# Patient Record
Sex: Female | Born: 1965
Health system: Southern US, Community
[De-identification: ages and names within clinical notes are randomized; demographics above are authoritative.]

## PROBLEM LIST (undated history)

## (undated) DIAGNOSIS — E119 Type 2 diabetes mellitus without complications: Secondary | ICD-10-CM

## (undated) DIAGNOSIS — Z9289 Personal history of other medical treatment: Secondary | ICD-10-CM

## (undated) DIAGNOSIS — Z9981 Dependence on supplemental oxygen: Secondary | ICD-10-CM

## (undated) DIAGNOSIS — I2510 Atherosclerotic heart disease of native coronary artery without angina pectoris: Secondary | ICD-10-CM

## (undated) DIAGNOSIS — R519 Headache, unspecified: Secondary | ICD-10-CM

## (undated) DIAGNOSIS — F329 Major depressive disorder, single episode, unspecified: Secondary | ICD-10-CM

## (undated) DIAGNOSIS — J189 Pneumonia, unspecified organism: Secondary | ICD-10-CM

## (undated) DIAGNOSIS — I10 Essential (primary) hypertension: Secondary | ICD-10-CM

## (undated) DIAGNOSIS — J45909 Unspecified asthma, uncomplicated: Secondary | ICD-10-CM

## (undated) DIAGNOSIS — E7800 Pure hypercholesterolemia, unspecified: Secondary | ICD-10-CM

## (undated) DIAGNOSIS — F419 Anxiety disorder, unspecified: Secondary | ICD-10-CM

## (undated) DIAGNOSIS — N186 End stage renal disease: Secondary | ICD-10-CM

## (undated) DIAGNOSIS — E875 Hyperkalemia: Secondary | ICD-10-CM

## (undated) DIAGNOSIS — E109 Type 1 diabetes mellitus without complications: Secondary | ICD-10-CM

## (undated) DIAGNOSIS — I509 Heart failure, unspecified: Secondary | ICD-10-CM

## (undated) DIAGNOSIS — R51 Headache: Secondary | ICD-10-CM

## (undated) DIAGNOSIS — Z992 Dependence on renal dialysis: Secondary | ICD-10-CM

## (undated) DIAGNOSIS — F32A Depression, unspecified: Secondary | ICD-10-CM

## (undated) DIAGNOSIS — I2699 Other pulmonary embolism without acute cor pulmonale: Secondary | ICD-10-CM

## (undated) DIAGNOSIS — D649 Anemia, unspecified: Secondary | ICD-10-CM

## (undated) HISTORY — PX: ENDOMETRIAL ABLATION: SHX621

## (undated) HISTORY — PX: CORONARY ANGIOPLASTY WITH STENT PLACEMENT: SHX49

---

## 2009-08-15 ENCOUNTER — Emergency Department: Admit: 2009-08-15
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
## 2009-08-15 NOTE — ED Triage Note (Signed)
States took meds the am

## 2009-08-16 ENCOUNTER — Emergency Department: Admit: 2009-08-16 | Attending: EMERGENCY MEDICINE | Admitting: EMERGENCY MEDICINE

## 2009-08-16 ENCOUNTER — Encounter: Payer: Self-pay | Admitting: EMERGENCY MEDICINE

## 2009-08-16 HISTORY — DX: Type 2 diabetes mellitus without complications: E11.9

## 2009-08-16 MED ORDER — ONDANSETRON HCL (PF) 4 MG/2 ML INJECTION SOLUTION
4.0000 mg | Freq: Once | INTRAMUSCULAR | Status: AC
Start: 2009-08-17 — End: 2009-08-16
  Administered 2009-08-16: 4 mg via INTRAVENOUS

## 2009-08-16 MED ORDER — MORPHINE 4 MG/ML INJECTION SYRINGE
INJECTION | INTRAMUSCULAR | Status: AC
Start: 2009-08-16 — End: 2009-08-16
  Filled 2009-08-16: qty 1

## 2009-08-16 MED ORDER — ONDANSETRON HCL (PF) 4 MG/2 ML INJECTION SOLUTION
INTRAMUSCULAR | Status: AC
Start: 2009-08-16 — End: 2009-08-16
  Filled 2009-08-16: qty 2

## 2009-08-16 MED ORDER — OXYCODONE-ACETAMINOPHEN 5 MG-325 MG TABLET
1.0000 | ORAL_TABLET | Freq: Once | ORAL | Status: AC
Start: 2009-08-17 — End: 2009-08-17
  Administered 2009-08-17: 1 via ORAL

## 2009-08-16 MED ORDER — MORPHINE 2 MG/ML INJECTION SYRINGE
2.0000 mg | INJECTION | INTRAMUSCULAR | Status: DC | PRN
Start: 2009-08-16 — End: 2009-08-16
  Administered 2009-08-16 – 2009-08-17 (×2): 4 mg via INTRAVENOUS

## 2009-08-16 MED ORDER — ACETAMINOPHEN 325 MG TABLET
650.0000 mg | ORAL_TABLET | Freq: Once | ORAL | Status: AC
Start: 2009-08-16 — End: 2009-08-16
  Administered 2009-08-16: 650 mg via ORAL

## 2009-08-16 MED ORDER — ACETAMINOPHEN 325 MG TABLET
ORAL_TABLET | ORAL | Status: AC
Start: 2009-08-16 — End: 2009-08-16
  Filled 2009-08-16: qty 2

## 2009-08-16 MED ORDER — NACL 0.9% IV BOLUS - DURATION REQ
1000.0000 mL | Freq: Once | INTRAVENOUS | Status: AC
Start: 2009-08-16 — End: 2009-08-16
  Administered 2009-08-16: 1000 mL via INTRAVENOUS
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
## 2009-08-16 NOTE — ED Nursing Note (Signed)
Fluids and snacks provided for the patient.

## 2009-08-16 NOTE — ED Triage Note (Signed)
C/o fever, coughing blood- dark, c/o chest pain to left chest rad to left arm.  LS cta, FSBS 321.  Pt given asa, ntg x 3, CP now 610.  Pt speaking well, ambulates with steady gait.  Directly to Pod D bed.

## 2009-08-16 NOTE — ED Nursing Note (Signed)
Patient still states that her chest pain is 6/10 sharp and constant and radiating to the right arm. Will accept morphine at this time.

## 2009-08-17 DIAGNOSIS — R0781 Pleurodynia: Secondary | ICD-10-CM | POA: Insufficient documentation

## 2009-08-17 MED ORDER — ACETAMINOPHEN ER 650 MG TABLET,EXTENDED RELEASE
650.0000 mg | EXTENDED_RELEASE_TABLET | Freq: Three times a day (TID) | ORAL | Status: AC | PRN
Start: 2009-08-17 — End: 2009-08-24

## 2009-08-17 MED ORDER — OXYCODONE-ACETAMINOPHEN 5 MG-325 MG TABLET
ORAL_TABLET | ORAL | Status: AC
Start: 2009-08-17 — End: 2009-08-17
  Filled 2009-08-17: qty 1

## 2009-08-17 NOTE — ED Progress Note (Signed)
ED tansfer of care acceptance note.    I assumed the care of this pt from the prior attending, at change of shift. See their notes for the initial H&P and asessment/plan. Evaluation was still ongoing with some items pending. I have personally reviewed the pertinent labs and other studies and discussed the care with the off-going team.    Pending items included further evaluation by the PES service. Their concerns regarding whether the patient had a safe housing situation to return to.  As a result of this further evaluation PS has resolved. Her housing options. They're comfortable with discharging the patient at this time. The patient had already been medically cleared.    The patient is being discharged home in stable and improved condition. Ambulatory without assistance and tolerating oral intake without difficulty. The results of diagnostic studies were explained to the pt, or their representative, and questions answered. For the D/C instructions and medications, see the "after-visit-summary" records. The patient (or their legal representative) acknowledged receipt and understanding of those instructions and signed the D/C form. Will return to the ED for worsening or any sudden changes in symptoms.      Note is electronically signed by;  Malvin Johns. Dionisio David, MD, MPH  Professor and Attending Physician  Department of Emergency Medicine

## 2009-08-17 NOTE — ED Nursing Note (Signed)
Patient is alert and oriented x4. Perrla. States that her pain is 7/10 in the abdomen and chest achy and constant. Denies nausea, vomiting, or shortness of breath. States that she and her sister have made an arrangement and that she will go to [facility] to live with her. Patient is not afraid to go back to [facility] at this time. Comfort and safety measures are observed.
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
## 2009-08-17 NOTE — ED Nursing Note (Signed)
Assumed care of pt for lunch relief. Report received from Dot, RN.

## 2009-09-22 ENCOUNTER — Emergency Department: Admit: 2009-09-22

## 2009-09-22 NOTE — ED Triage Note (Signed)
Pt with r flank and side pain for 1 day.  Denies trauma.  States blood in urine.  Ambulated with steady gait.  NAD>  Awake and alert.  bs reading "hi" at triage.  States has been taking dm meds.

## 2010-06-02 DIAGNOSIS — I2699 Other pulmonary embolism without acute cor pulmonale: Secondary | ICD-10-CM

## 2010-06-02 HISTORY — DX: Other pulmonary embolism without acute cor pulmonale: I26.99

## 2011-11-04 ENCOUNTER — Ambulatory Visit

## 2011-12-09 ENCOUNTER — Ambulatory Visit: Admitting: Family Medicine

## 2015-10-20 ENCOUNTER — Encounter: Admission: AD | Disposition: A | Discharge status: Still In Hospital | Payer: Self-pay | Attending: Ophthalmology

## 2015-10-20 ENCOUNTER — Ambulatory Visit: Payer: MEDICAID | Admitting: Anesthesiology

## 2015-10-20 ENCOUNTER — Emergency Department (EMERGENCY_DEPARTMENT_HOSPITAL): Payer: MEDICAID

## 2015-10-20 ENCOUNTER — Inpatient Hospital Stay
Admission: EM | Admit: 2015-10-20 | Discharge: 2015-11-18 | DRG: 116 | Disposition: A | Discharge status: Home Health | Payer: MEDICAID | Attending: Hospitalist | Admitting: Hospitalist

## 2015-10-20 ENCOUNTER — Ambulatory Visit (HOSPITAL_BASED_OUTPATIENT_CLINIC_OR_DEPARTMENT_OTHER): Payer: MEDICAID

## 2015-10-20 ENCOUNTER — Ambulatory Visit (HOSPITAL_BASED_OUTPATIENT_CLINIC_OR_DEPARTMENT_OTHER)
Admission: AD | Admit: 2015-10-20 | Discharge: 2015-10-20 | Disposition: A | Discharge status: Still In Hospital | Payer: MEDICAID | Attending: Ophthalmology | Admitting: Ophthalmology

## 2015-10-20 ENCOUNTER — Encounter: Payer: Self-pay | Admitting: Emergency Medicine

## 2015-10-20 DIAGNOSIS — J810 Acute pulmonary edema: Secondary | ICD-10-CM

## 2015-10-20 DIAGNOSIS — H409 Unspecified glaucoma: Secondary | ICD-10-CM | POA: Diagnosis present

## 2015-10-20 DIAGNOSIS — Z95828 Presence of other vascular implants and grafts: Secondary | ICD-10-CM | POA: Insufficient documentation

## 2015-10-20 DIAGNOSIS — H4310 Vitreous hemorrhage, unspecified eye: Secondary | ICD-10-CM | POA: Insufficient documentation

## 2015-10-20 DIAGNOSIS — Z794 Long term (current) use of insulin: Secondary | ICD-10-CM | POA: Insufficient documentation

## 2015-10-20 DIAGNOSIS — H5441 Blindness, right eye, normal vision left eye: Secondary | ICD-10-CM | POA: Diagnosis present

## 2015-10-20 DIAGNOSIS — R9431 Abnormal electrocardiogram [ECG] [EKG]: Secondary | ICD-10-CM

## 2015-10-20 DIAGNOSIS — Z86711 Personal history of pulmonary embolism: Secondary | ICD-10-CM | POA: Insufficient documentation

## 2015-10-20 DIAGNOSIS — I313 Pericardial effusion (noninflammatory): Secondary | ICD-10-CM

## 2015-10-20 DIAGNOSIS — R6883 Chills (without fever): Secondary | ICD-10-CM

## 2015-10-20 DIAGNOSIS — D638 Anemia in other chronic diseases classified elsewhere: Secondary | ICD-10-CM | POA: Diagnosis present

## 2015-10-20 DIAGNOSIS — S92252D Displaced fracture of navicular [scaphoid] of left foot, subsequent encounter for fracture with routine healing: Secondary | ICD-10-CM | POA: Insufficient documentation

## 2015-10-20 DIAGNOSIS — T45515A Adverse effect of anticoagulants, initial encounter: Secondary | ICD-10-CM | POA: Diagnosis present

## 2015-10-20 DIAGNOSIS — B348 Other viral infections of unspecified site: Secondary | ICD-10-CM | POA: Diagnosis present

## 2015-10-20 DIAGNOSIS — H4311 Vitreous hemorrhage, right eye: Principal | ICD-10-CM

## 2015-10-20 DIAGNOSIS — K5900 Constipation, unspecified: Secondary | ICD-10-CM | POA: Diagnosis present

## 2015-10-20 DIAGNOSIS — R0602 Shortness of breath: Secondary | ICD-10-CM | POA: Insufficient documentation

## 2015-10-20 DIAGNOSIS — Z5309 Procedure and treatment not carried out because of other contraindication: Secondary | ICD-10-CM

## 2015-10-20 DIAGNOSIS — Z538 Procedure and treatment not carried out for other reasons: Secondary | ICD-10-CM

## 2015-10-20 DIAGNOSIS — R55 Syncope and collapse: Secondary | ICD-10-CM | POA: Diagnosis present

## 2015-10-20 DIAGNOSIS — R5381 Other malaise: Secondary | ICD-10-CM

## 2015-10-20 DIAGNOSIS — H4051X0 Glaucoma secondary to other eye disorders, right eye, stage unspecified: Secondary | ICD-10-CM

## 2015-10-20 DIAGNOSIS — R791 Abnormal coagulation profile: Secondary | ICD-10-CM | POA: Insufficient documentation

## 2015-10-20 DIAGNOSIS — E113593 Type 2 diabetes mellitus with proliferative diabetic retinopathy without macular edema, bilateral: Secondary | ICD-10-CM | POA: Diagnosis present

## 2015-10-20 DIAGNOSIS — Z7901 Long term (current) use of anticoagulants: Secondary | ICD-10-CM | POA: Insufficient documentation

## 2015-10-20 DIAGNOSIS — D6832 Hemorrhagic disorder due to extrinsic circulating anticoagulants: Secondary | ICD-10-CM | POA: Diagnosis present

## 2015-10-20 DIAGNOSIS — N184 Chronic kidney disease, stage 4 (severe): Secondary | ICD-10-CM | POA: Diagnosis present

## 2015-10-20 DIAGNOSIS — I5033 Acute on chronic diastolic (congestive) heart failure: Secondary | ICD-10-CM | POA: Diagnosis present

## 2015-10-20 DIAGNOSIS — I130 Hypertensive heart and chronic kidney disease with heart failure and stage 1 through stage 4 chronic kidney disease, or unspecified chronic kidney disease: Secondary | ICD-10-CM | POA: Diagnosis present

## 2015-10-20 DIAGNOSIS — R8271 Bacteriuria: Secondary | ICD-10-CM | POA: Diagnosis not present

## 2015-10-20 DIAGNOSIS — N179 Acute kidney failure, unspecified: Secondary | ICD-10-CM | POA: Diagnosis present

## 2015-10-20 LAB — CBC WITH DIFFERENTIAL
Basophils % Auto: 0.5 %
Basophils Abs Auto: 0 10*3/uL (ref 0–0.2)
Eosinophils % Auto: 3.8 %
Eosinophils Abs Auto: 0.2 10*3/uL (ref 0–0.5)
Hematocrit: 28.5 % — ABNORMAL LOW (ref 34–46)
Hemoglobin: 8.7 g/dL — ABNORMAL LOW (ref 12.0–16.0)
Lymphocytes % Auto: 19.6 %
Lymphocytes Abs Auto: 1.1 10*3/uL (ref 1.0–4.8)
MCH: 21.8 pg — ABNORMAL LOW (ref 27–33)
MCHC: 30.6 % — ABNORMAL LOW (ref 32–36)
MCV: 71 UM3 — ABNORMAL LOW (ref 80–100)
MPV: 6.9 UM3 (ref 6.8–10.0)
Monocytes % Auto: 10.1 %
Monocytes Abs Auto: 0.6 10*3/uL (ref 0.1–0.8)
Neutrophils % Auto: 66 %
Neutrophils Abs Auto: 3.7 10*3/uL (ref 1.80–7.70)
Platelet Count: 276 10*3/uL (ref 130–400)
RDW: 19.2 UNITS — ABNORMAL HIGH (ref 0–14.7)
Red Blood Cell Count: 4.01 10*6/uL (ref 3.7–5.5)
White Blood Cell Count: 5.7 10*3/uL (ref 4.5–11.0)

## 2015-10-20 LAB — POC CRITICAL LAB PANEL
POC BASE EXCESS (BLOOD): -0.5 meq/L (ref <=2.0)
POC CALCULATED HEMOGLOBIN: 9.8 g/dL — AB (ref 14.0–18.0)
POC CALUCLATED BICARBONATE/ACT: 25.5 meq/L (ref 20.0–28.0)
POC CHLORIDE: 106 mmol/L (ref 95–110)
POC CTCO2: 26.9 mmol/L (ref 22.0–29.0)
POC GLUCOSE EPOC: 112 mg/dL — AB (ref 70–99)
POC HEMATOCRIT: 29 % — AB (ref 38–51)
POC ICALCIUM: 1.12 mmol/L — AB (ref 1.17–1.31)
POC LACTATE: 0.71 mmol/L — AB (ref 0.90–1.70)
POC O2SAT: 58.5 % — AB (ref 70.0–100.0)
POC PCO2: 47 mmHg (ref 35.0–50.0)
POC PHB: 7.342 (ref 7.300–7.400)
POC PO2: 32.6 mmHg — AB (ref 30.0–55.0)
POC POTASSIUM: 4.1 mmol/L (ref 3.3–4.8)
POC SODIUM: 140 mmol/L (ref 137–147)

## 2015-10-20 LAB — URINALYSIS AND CULTURE IF IND
Bilirubin Urine: NEGATIVE
Glucose Urine: 300 mg/dL
Leuk. Esterase: NEGATIVE
Nitrite Urine: NEGATIVE
Protein Urine: >=500 mg/dL — AB
RBC: 14 /HPF — ABNORMAL HIGH (ref 0–5)
Specific Gravity: 1.014 (ref 1.002–1.030)
Squamous EPI: 1 /HPF (ref 0–5)
Trans Epi: <1 /HPF (ref 0–5)
Urobilinogen.: NEGATIVE mg/dL (ref ?–2.0)
WBC: 2 /HPF (ref 0–5)
pH URINE: 7.5 (ref 4.8–7.8)

## 2015-10-20 LAB — HEPATIC FUNCTION PANEL
Alanine Transferase (ALT): 29 U/L (ref 5–54)
Albumin: 1.9 g/dL — ABNORMAL LOW (ref 3.4–4.8)
Alkaline Phosphatase (ALP): 187 U/L — ABNORMAL HIGH (ref 35–115)
Aspartate Transaminase (AST): 22 U/L (ref 15–43)
Bilirubin Direct: 0.1 mg/dL (ref 0.0–0.2)
Bilirubin Total: 0.5 mg/dL (ref 0.3–1.3)
Protein: 5.5 g/dL — ABNORMAL LOW (ref 6.3–8.3)

## 2015-10-20 LAB — BASIC METABOLIC PANEL
Calcium: 8 mg/dL — ABNORMAL LOW (ref 8.6–10.5)
Carbon Dioxide Total: 23 mEq/L — ABNORMAL LOW (ref 24–32)
Chloride: 106 mEq/L (ref 95–110)
Creatinine Blood: 3.58 mg/dL — ABNORMAL HIGH (ref 0.44–1.27)
E-GFR, African American: 16 SEE NOTE — ABNORMAL LOW (ref >60)
E-GFR, Non-African American: 14 — ABNORMAL LOW (ref >60)
Glucose: 118 mg/dL — ABNORMAL HIGH (ref 70–99)
Potassium: 4.1 mEq/L (ref 3.3–5.0)
Sodium: 137 mEq/L (ref 135–145)
Urea Nitrogen, Blood (BUN): 44 mg/dL — ABNORMAL HIGH (ref 8–22)

## 2015-10-20 LAB — LACTIC ACID, WHOLE BLD VENOUS: Lactic Acid, Whole Bld Venous: 0.9 mmol/L (ref 0.9–1.7)

## 2015-10-20 LAB — BLD GAS VENOUS
Base Excess, Ven: 1 mEq/L (ref <=2)
HCO3, Ven: 25 mEq/L (ref 20–28)
O2 Sat, Ven: 77 % (ref 70–100)
PCO2, Ven: 41 mm Hg (ref 35–50)
PO2, Ven: 42 mm Hg (ref 30–55)
pH, VEN: 7.4 (ref 7.3–7.4)

## 2015-10-20 LAB — NT-PRO B-TYPE NATRIURETIC PEPTIDE: B-Type Natriuretic Peptide: 145 pg/mL — ABNORMAL HIGH (ref 0–100)

## 2015-10-20 LAB — LIPASE: Lipase: 27 U/L (ref 13–51)

## 2015-10-20 LAB — TROPONIN I: Troponin I: <0.01 ng/mL (ref 0–0.04)

## 2015-10-20 LAB — INR: INR: 4.87 — AB (ref 0.87–1.18)

## 2015-10-20 SURGERY — VITRECTOMY, PARS PLANA APPROACH
Anesthesia: General | Laterality: Right

## 2015-10-20 MED ORDER — FUROSEMIDE 10 MG/ML INJECTION SOLUTION
40.0000 mg | Freq: Once | INTRAMUSCULAR | Status: AC
Start: 2015-10-20 — End: 2015-10-20
  Administered 2015-10-20: 40 mg via INTRAVENOUS
  Filled 2015-10-20: qty 4

## 2015-10-20 MED ORDER — PROPARACAINE 0.5 % EYE DROPS
3.0000 [drp] | OPHTHALMIC | Status: AC
Start: 2015-10-20 — End: 2015-10-20
  Administered 2015-10-20: 3 [drp] via OPHTHALMIC
  Filled 2015-10-20: qty 15

## 2015-10-20 MED ORDER — HYDROMORPHONE 1 MG/ML INJECTION SYRINGE
0.2000 mg | INJECTION | INTRAMUSCULAR | Status: DC | PRN
Start: 2015-10-20 — End: 2015-10-20

## 2015-10-20 MED ORDER — DEXTROSE 50 % IN WATER (D50W) INTRAVENOUS SYRINGE
50.0000 mL | INJECTION | INTRAVENOUS | Status: DC | PRN
Start: 2015-10-20 — End: 2015-11-18

## 2015-10-20 MED ORDER — WARFARIN PATIENT FLAG
Status: DC
Start: 2015-10-20 — End: 2015-11-18
  Filled 2015-10-20: qty 1

## 2015-10-20 MED ORDER — ALBUTEROL SULFATE 2.5 MG/3 ML (0.083 %) SOLUTION FOR NEBULIZATION
INHALATION_SOLUTION | RESPIRATORY_TRACT | Status: AC
Start: 2015-10-20 — End: 2015-10-20
  Filled 2015-10-20: qty 3

## 2015-10-20 MED ORDER — GABAPENTIN 100 MG CAPSULE
100.0000 mg | ORAL_CAPSULE | Freq: Three times a day (TID) | ORAL | Status: DC
Start: 2015-10-20 — End: 2015-11-18
  Administered 2015-10-20 – 2015-11-18 (×86): 100 mg via ORAL
  Filled 2015-10-20 (×86): qty 1

## 2015-10-20 MED ORDER — HYDRALAZINE 20 MG/ML INJECTION SOLUTION
10.0000 mg | INTRAMUSCULAR | Status: DC | PRN
Start: 2015-10-20 — End: 2015-11-18
  Administered 2015-10-30 – 2015-11-02 (×3): 10 mg via INTRAVENOUS
  Filled 2015-10-20 (×4): qty 1

## 2015-10-20 MED ORDER — HYDRALAZINE 50 MG TABLET
100.0000 mg | ORAL_TABLET | Freq: Three times a day (TID) | ORAL | Status: DC
Start: 2015-10-20 — End: 2015-11-18
  Administered 2015-10-20 – 2015-11-18 (×86): 100 mg via ORAL
  Filled 2015-10-20 (×86): qty 2

## 2015-10-20 MED ORDER — MINERAL OIL ENEMA
1.0000 | ENEMA | Freq: Every day | RECTAL | Status: DC | PRN
Start: 2015-10-20 — End: 2015-11-18
  Filled 2015-10-20 (×2): qty 1

## 2015-10-20 MED ORDER — LACTULOSE 10 GRAM/15 ML (15 ML) ORAL SOLUTION
30.0000 mL | Freq: Four times a day (QID) | ORAL | Status: DC | PRN
Start: 2015-10-20 — End: 2015-11-05
  Administered 2015-11-05: 30 mL via ORAL
  Filled 2015-10-20: qty 30

## 2015-10-20 MED ORDER — DOCUSATE SODIUM 100 MG CAPSULE
100.0000 mg | ORAL_CAPSULE | Freq: Two times a day (BID) | ORAL | Status: DC
Start: 2015-10-20 — End: 2015-11-05
  Administered 2015-10-20 – 2015-11-05 (×10): 100 mg via ORAL
  Filled 2015-10-20 (×17): qty 1

## 2015-10-20 MED ORDER — LATANOPROST 0.005 % EYE DROPS
1.0000 [drp] | Freq: Every day | OPHTHALMIC | Status: DC
Start: 2015-10-20 — End: 2015-10-28
  Administered 2015-10-20 – 2015-10-27 (×8): 1 [drp] via OPHTHALMIC
  Filled 2015-10-20: qty 2.5

## 2015-10-20 MED ORDER — DORZOLAMIDE 22.3 MG-TIMOLOL 6.8 MG/ML EYE DROPS
1.0000 [drp] | Freq: Two times a day (BID) | OPHTHALMIC | Status: DC
Start: 2015-10-20 — End: 2015-10-28
  Administered 2015-10-20 – 2015-10-28 (×16): 1 [drp] via OPHTHALMIC
  Filled 2015-10-20: qty 10

## 2015-10-20 MED ORDER — ATORVASTATIN 80 MG TABLET
80.0000 mg | ORAL_TABLET | Freq: Every day | ORAL | Status: DC
Start: 2015-10-20 — End: 2015-11-18
  Administered 2015-10-20 – 2015-11-17 (×29): 80 mg via ORAL
  Filled 2015-10-20 (×29): qty 1

## 2015-10-20 MED ORDER — GLUCAGON (HUMAN RECOMBINANT) 1 MG SOLUTION FOR INJECTION
1.0000 mg | INTRAMUSCULAR | Status: DC | PRN
Start: 2015-10-20 — End: 2015-11-18

## 2015-10-20 MED ORDER — POLYETHYLENE GLYCOL 3350 17 GRAM ORAL POWDER PACKET
17.0000 g | Freq: Every day | ORAL | Status: DC
Start: 2015-10-21 — End: 2015-11-18
  Administered 2015-11-03 – 2015-11-17 (×6): 17 g via ORAL
  Filled 2015-10-20 (×11): qty 1

## 2015-10-20 MED ORDER — BISACODYL 10 MG RECTAL SUPPOSITORY
10.0000 mg | RECTAL | Status: DC | PRN
Start: 2015-10-20 — End: 2015-11-18

## 2015-10-20 MED ORDER — PHENYLEPHRINE 2.5 % EYE DROPS
3.0000 [drp] | OPHTHALMIC | Status: AC
Start: 2015-10-20 — End: 2015-10-20
  Administered 2015-10-20: 3 [drp] via OPHTHALMIC
  Filled 2015-10-20: qty 2

## 2015-10-20 MED ORDER — DEXTROSE 50 % IN WATER (D50W) INTRAVENOUS SYRINGE
25.0000 mL | INJECTION | INTRAVENOUS | Status: DC | PRN
Start: 2015-10-20 — End: 2015-11-18

## 2015-10-20 MED ORDER — ALBUTEROL SULFATE 2.5 MG/3 ML (0.083 %) SOLUTION FOR NEBULIZATION
2.5000 mg | INHALATION_SOLUTION | RESPIRATORY_TRACT | Status: DC | PRN
Start: 2015-10-20 — End: 2015-10-20
  Administered 2015-10-20: 2.5 mg via RESPIRATORY_TRACT

## 2015-10-20 MED ORDER — ONDANSETRON HCL (PF) 4 MG/2 ML INJECTION SOLUTION
4.0000 mg | Freq: Three times a day (TID) | INTRAMUSCULAR | Status: DC | PRN
Start: 2015-10-20 — End: 2015-11-18
  Administered 2015-10-26 – 2015-11-15 (×14): 4 mg via INTRAVENOUS
  Filled 2015-10-20 (×15): qty 2

## 2015-10-20 MED ORDER — VANCOMYCIN 1 GRAM/200 ML IN DEXTROSE 5 % INTRAVENOUS PIGGYBACK
1000.0000 mg | INJECTION | Freq: Once | INTRAVENOUS | Status: AC
Start: 2015-10-20 — End: 2015-10-20
  Administered 2015-10-20: 1000 mg via INTRAVENOUS
  Filled 2015-10-20: qty 200

## 2015-10-20 MED ORDER — ONDANSETRON HCL (PF) 4 MG/2 ML INJECTION SOLUTION
4.0000 mg | INTRAMUSCULAR | Status: DC | PRN
Start: 2015-10-20 — End: 2015-10-20

## 2015-10-20 MED ORDER — INSULIN ASPART (U-100) 100 UNIT/ML (3 ML) SUBCUTANEOUS PEN
1.0000 [IU] | PEN_INJECTOR | Freq: Three times a day (TID) | SUBCUTANEOUS | Status: DC
Start: 2015-10-21 — End: 2015-11-18
  Administered 2015-10-23: 1 [IU] via SUBCUTANEOUS
  Administered 2015-10-23: 2 [IU] via SUBCUTANEOUS
  Administered 2015-10-24: 1 [IU] via SUBCUTANEOUS
  Administered 2015-10-24: 2 [IU] via SUBCUTANEOUS
  Administered 2015-10-24: 1 [IU] via SUBCUTANEOUS
  Administered 2015-10-25 – 2015-10-26 (×4): 2 [IU] via SUBCUTANEOUS
  Administered 2015-10-26 – 2015-10-27 (×3): 1 [IU] via SUBCUTANEOUS
  Administered 2015-10-28: 2 [IU] via SUBCUTANEOUS
  Administered 2015-10-28 (×2): 1 [IU] via SUBCUTANEOUS
  Administered 2015-10-29: 2 [IU] via SUBCUTANEOUS
  Administered 2015-10-29: 3 [IU] via SUBCUTANEOUS
  Administered 2015-10-29 – 2015-10-30 (×4): 2 [IU] via SUBCUTANEOUS
  Administered 2015-10-31: 3 [IU] via SUBCUTANEOUS
  Administered 2015-10-31 – 2015-11-01 (×3): 2 [IU] via SUBCUTANEOUS
  Administered 2015-11-01: 3 [IU] via SUBCUTANEOUS
  Administered 2015-11-01: 2 [IU] via SUBCUTANEOUS
  Administered 2015-11-02: 3 [IU] via SUBCUTANEOUS
  Administered 2015-11-02: 2 [IU] via SUBCUTANEOUS
  Administered 2015-11-02 – 2015-11-03 (×2): 3 [IU] via SUBCUTANEOUS
  Administered 2015-11-03 (×2): 2 [IU] via SUBCUTANEOUS
  Administered 2015-11-04: 1 [IU] via SUBCUTANEOUS
  Administered 2015-11-04 – 2015-11-06 (×5): 2 [IU] via SUBCUTANEOUS
  Administered 2015-11-06 – 2015-11-07 (×3): 1 [IU] via SUBCUTANEOUS
  Administered 2015-11-07 – 2015-11-08 (×2): 2 [IU] via SUBCUTANEOUS
  Administered 2015-11-08: 1 [IU] via SUBCUTANEOUS
  Administered 2015-11-09: 2 [IU] via SUBCUTANEOUS
  Administered 2015-11-09 (×2): 1 [IU] via SUBCUTANEOUS
  Administered 2015-11-10: 3 [IU] via SUBCUTANEOUS
  Administered 2015-11-10 – 2015-11-11 (×3): 1 [IU] via SUBCUTANEOUS
  Administered 2015-11-11: 2 [IU] via SUBCUTANEOUS
  Administered 2015-11-12 (×2): 1 [IU] via SUBCUTANEOUS
  Administered 2015-11-12: 2 [IU] via SUBCUTANEOUS
  Administered 2015-11-13: 1 [IU] via SUBCUTANEOUS
  Administered 2015-11-13: 2 [IU] via SUBCUTANEOUS
  Administered 2015-11-14: 1 [IU] via SUBCUTANEOUS
  Administered 2015-11-14: 2 [IU] via SUBCUTANEOUS
  Administered 2015-11-15 – 2015-11-16 (×4): 1 [IU] via SUBCUTANEOUS
  Administered 2015-11-17 – 2015-11-18 (×4): 2 [IU] via SUBCUTANEOUS
  Filled 2015-10-20 (×3): qty 300

## 2015-10-20 MED ORDER — NACL 0.9% MINI-BAG PLUS 50 ML
1.0000 g | Freq: Once | INTRAVENOUS | Status: AC
Start: 2015-10-20 — End: 2015-10-20
  Administered 2015-10-20: 1 g via INTRAVENOUS
  Filled 2015-10-20: qty 1000

## 2015-10-20 MED ORDER — METOCLOPRAMIDE 5 MG/ML INJECTION SOLUTION
10.0000 mg | INTRAMUSCULAR | Status: DC | PRN
Start: 2015-10-20 — End: 2015-10-20

## 2015-10-20 MED ORDER — INSULIN ASPART (U-100) 100 UNIT/ML (3 ML) SUBCUTANEOUS PEN
1.0000 [IU] | PEN_INJECTOR | Freq: Every day | SUBCUTANEOUS | Status: DC
Start: 2015-10-20 — End: 2015-11-18
  Administered 2015-10-23 – 2015-10-25 (×2): 2 [IU] via SUBCUTANEOUS
  Administered 2015-10-26 – 2015-10-27 (×2): 1 [IU] via SUBCUTANEOUS
  Administered 2015-10-28: 2 [IU] via SUBCUTANEOUS
  Administered 2015-10-29: 3 [IU] via SUBCUTANEOUS
  Administered 2015-10-30: 1 [IU] via SUBCUTANEOUS
  Administered 2015-10-31: 3 [IU] via SUBCUTANEOUS
  Administered 2015-11-01: 4 [IU] via SUBCUTANEOUS
  Administered 2015-11-02: 3 [IU] via SUBCUTANEOUS
  Administered 2015-11-03 – 2015-11-05 (×3): 2 [IU] via SUBCUTANEOUS
  Administered 2015-11-06 – 2015-11-12 (×4): 1 [IU] via SUBCUTANEOUS
  Administered 2015-11-14 – 2015-11-15 (×2): 2 [IU] via SUBCUTANEOUS
  Administered 2015-11-17: 1 [IU] via SUBCUTANEOUS
  Filled 2015-10-20 (×2): qty 300

## 2015-10-20 MED ORDER — TROPICAMIDE 1 % EYE DROPS
3.0000 [drp] | OPHTHALMIC | Status: AC
Start: 2015-10-20 — End: 2015-10-20
  Administered 2015-10-20: 3 [drp] via OPHTHALMIC
  Filled 2015-10-20: qty 2

## 2015-10-20 MED ORDER — AMLODIPINE 10 MG TABLET
10.0000 mg | ORAL_TABLET | Freq: Every day | ORAL | Status: DC
Start: 2015-10-21 — End: 2015-11-18
  Administered 2015-10-21 – 2015-11-18 (×29): 10 mg via ORAL
  Filled 2015-10-20 (×30): qty 1

## 2015-10-20 MED ORDER — FENTANYL (PF) 50 MCG/ML INJECTION SOLUTION
25.0000 ug | INTRAMUSCULAR | Status: DC | PRN
Start: 2015-10-20 — End: 2015-10-20

## 2015-10-20 MED ORDER — SENNOSIDES 8.6 MG TABLET
2.0000 | ORAL_TABLET | Freq: Every day | ORAL | Status: DC
Start: 2015-10-20 — End: 2015-11-18
  Administered 2015-10-20 – 2015-11-13 (×10): 17.2 mg via ORAL
  Filled 2015-10-20 (×17): qty 2

## 2015-10-20 MED ORDER — CYCLOPENTOLATE 1 % EYE DROPS
3.0000 [drp] | OPHTHALMIC | Status: AC
Start: 2015-10-20 — End: 2015-10-20
  Administered 2015-10-20: 3 [drp] via OPHTHALMIC
  Filled 2015-10-20: qty 2

## 2015-10-20 MED ORDER — NACL 0.9% IV INFUSION
INTRAVENOUS | Status: DC
Start: 2015-10-20 — End: 2015-10-20
  Administered 2015-10-20: 15:00:00 via INTRAVENOUS

## 2015-10-20 MED ORDER — ACETAMINOPHEN 325 MG TABLET
650.0000 mg | ORAL_TABLET | ORAL | Status: DC | PRN
Start: 2015-10-20 — End: 2015-11-05
  Administered 2015-10-20 – 2015-10-23 (×3): 650 mg via ORAL
  Filled 2015-10-20 (×4): qty 2

## 2015-10-20 MED ORDER — BRIMONIDINE 0.2 % EYE DROPS
1.0000 [drp] | Freq: Three times a day (TID) | OPHTHALMIC | Status: DC
Start: 2015-10-20 — End: 2015-11-18
  Administered 2015-10-20 – 2015-11-18 (×85): 1 [drp] via OPHTHALMIC
  Filled 2015-10-20 (×2): qty 5

## 2015-10-20 SURGICAL SUPPLY — 9 items
COVER LIGHT HANDLE STERIS (Other) ×2 IMPLANT
DRAPE MAYO STAND COVER LATEX FREE (Drape) ×2 IMPLANT
DRAPE ZEISS RESIGHT HEAD COVER 30"X18" (TS-Z-1000) (Drape) IMPLANT
GLOVES BIOGEL SENSOR 6 1/2 TOP GLOVE LATEX BURGUNDY PACKAGE (Glove) ×2 IMPLANT
GOWN SMALL AERO BLUE AAMI 3 STANDARD (Gown) ×2 IMPLANT
HEADREST DONUT 9IN (M10-090) (Other) ×2 IMPLANT
PACK RETINAL CUSTOM (DYNJ03939420) (Pack) ×2 IMPLANT
PAD EGGCRATE HEEL & ANKLE LF (4815) (Other) ×6 IMPLANT
SCD SLEEVE CALF REG 18IN ALP 1 (Other) ×2 IMPLANT

## 2015-10-20 NOTE — Nurse Transfer Note (Signed)
Pt transferred by gurney in stable condition to ER triage. All belongings with pt. Posey ProntoErin Korine Winton, RN

## 2015-10-20 NOTE — ED Nursing Note (Signed)
Received report from Tana, RN. Assumed pt care.

## 2015-10-20 NOTE — Nurse Assessment (Addendum)
Pt admitted to E8 coming from ED by bed. Pt A&O X4. Pt's belongings at bedside. ASSESSMENT NOTE    Note Started: 10/20/2015, 23:00     Pt admitted to E8 around 2210, coming from ED by bed. Initial assessment completed and recorded in EMR.  Report received from E8 charge nurse, Mr. Mariana KaufmanMarvin, and orders reviewed. Plan of Care reviewed and appropriate, discussed with patient.  Enriqueta ShutterGenevieve Lynk Marti, RN RN

## 2015-10-20 NOTE — ED Initial Note (Signed)
EMERGENCY DEPARTMENT PHYSICIAN NOTE - Leslie Hernandez       Date of Service:   10/20/2015  3:56 PM Patient's PCP: Charlett Noseoy A Berry III   Note Started: 10/20/2015 16:31 DOB: March 25, 1966             Chief Complaint   Patient presents with    Shortness Of Breath    Cough    Weakness    Chest Pain, Age >30 Non-Cardiac Features       The history provided by the patient.  Interpreter used: No    Leslie Hernandez is a [age]yr old female, with a past medical history significant for DM II, HTN, CHF, Pulmonary embolism, on Coumadin, vision loss, CKD stage IV, who presents to the ED with a chief complaint of cough and shortness of breath that began last night. Patient was scheduled to undergo eye surgery but sent down to ED from PACU due to elevated INR levels and patient not feeling well. Patient reports history of pulmonary embolism of unknown cause, currently on Coumadin. Reports onset of shortness of breath and chest pain last night. Endorses productive cough since 4 days ago (unsure of color due to patient's vision loss), with chills and x7 episodes of emesis for 4 days. Denies any fever, dysuria, cigarette/alcohol/recreational drug use.     Of note, patient currently lives at a skilled nursing facility for prior LLE fracture, expresses dissatisfaction with quality of care at nursing facility.     A full history, including pertinent past medical and social history was reviewed.    HISTORY:  1    *Shortness of breath     Allergies   Allergen Reactions    Metformin Diarrhea    Vicodin [Hydrocodone-Acetaminophen] Nausea/Vomiting      Past Medical History:    DM (diabetes mellitus)                                     Past Surgical History:    Pr cesarean delivery only                                     Social History    Marital status: SEPARATED           Spouse name:                       Years of education:                 Number of children:               Occupational History    None on file    Social History Main Topics     Smoking status: Never Smoker                                                                   Smokeless status: Not on file                       Alcohol use: No  Drug use: No              Sexual activity: Not on file          Other Topics            Concern    None on file    Social History Narrative    None on file     No family history on file.             Review of Systems   Constitutional: Positive for chills. Negative for fever.   Respiratory: Positive for cough and shortness of breath.    Cardiovascular: Positive for chest pain.   Gastrointestinal: Positive for vomiting.   Genitourinary: Negative for dysuria.       TRIAGE VITAL SIGNS:  Temp: 37.5 C (99.5 F) (10/20/15 1552)  Temp src: Oral (10/20/15 1552)  Pulse: 92 (10/20/15 1552)  BP: (!) 175/102 (10/20/15 1552)  Resp: 22 (10/20/15 1552)  SpO2: 95 % (10/20/15 1552)  Weight: (not recorded)    Physical Exam   Constitutional: She is oriented to person, place, and time. She appears well-developed and well-nourished. No distress.   Patient in bed, interactive and calm, appears slightly uncomfortable   HENT:   Head: Normocephalic and atraumatic.   Mouth/Throat: Oropharynx is clear and moist. No oropharyngeal exudate.   Eyes: Conjunctivae are normal. Pupils are equal, round, and reactive to light. Right eye exhibits no discharge. Left eye exhibits no discharge.   Neck: Neck supple. No tracheal deviation present.   Cardiovascular: Normal rate, regular rhythm and normal Hernandez sounds.  Exam reveals no gallop and no friction rub.    No murmur heard.  Pulmonary/Chest: Effort normal and breath sounds normal. No stridor. No respiratory distress.   Non productive cough, with mild rhonchi throughout   Abdominal: Soft. She exhibits no distension. There is no tenderness.   Genitourinary: Rectal exam shows guaiac negative stool (brown stool, guaic negative).   Genitourinary Comments: Rectal exam: Brown stool, Guaiac negative   Musculoskeletal: She exhibits  edema.   Bilateral pedal edema   Neurological: She is alert and oriented to person, place, and time.   Skin: Skin is warm and dry.   Psychiatric: She has a normal mood and affect. Her behavior is normal.   Nursing note and vitals reviewed.      INITIAL ASSESSMENT & PLAN, MEDICAL DECISION MAKING, ED COURSE  Leslie Hernandez is a 37yr female who presents with a chief complaint of cough and shortness of breath.     Differential includes, but is not limited to: see below    The results of the ED evaluation were notable for the following:    Pertinent lab results:   Results for orders placed or performed during the hospital encounter of 10/20/15   CBC WITH DIFFERENTIAL     Status: Abnormal   Result Value Status    WHITE BLOOD CELL COUNT 5.7 Final    RED CELL COUNT 4.01 Final    HEMOGLOBIN 8.7 (L) Final    HEMATOCRIT 28.5 (L) Final    MCV 71.0 (L) Final    MCH 21.8 (L) Final    MCHC 30.6 (L) Final    RDW 19.2 (H) Final    MPV 6.9 Final    PLATELET COUNT 276 Final    NEUTROPHILS % AUTO 66.0 Final    LYMPHOCYTES % AUTO 19.6 Final    MONOCYTES % AUTO 10.1 Final    EOSINOPHIL % AUTO 3.8 Final  BASOPHILS % AUTO 0.5 Final    NEUTROPHIL ABS AUTO 3.70 Final    LYMPHOCYTE ABS AUTO 1.1 Final    MONOCYTES ABS AUTO 0.6 Final    EOSINOPHIL ABS AUTO 0.2 Final    BASOPHILS ABS AUTO 0 Final   BASIC METABOLIC PANEL     Status: Abnormal   Result Value Status    SODIUM 137 Final    POTASSIUM 4.1 Final    CHLORIDE 106 Final    CARBON DIOXIDE TOTAL 23 (L) Final    UREA NITROGEN, BLOOD (BUN) 44 (H) Final    CREATININE BLOOD 3.58 (H) Final    E-GFR, AFRICAN AMERICAN 16 (L) Final    E-GFR, NON-AFRICAN AMERICAN 14 (L) Final    GLUCOSE 118 (H) Final    CALCIUM 8.0 (L) Final   HEPATIC FUNCTION PANEL     Status: Abnormal   Result Value Status    PROTEIN 5.5 (L) Final    ALBUMIN 1.9 (L) Final    ALKALINE PHOSPHATASE (ALP) 187 (H) Final    ASPARTATE TRANSAMINASE (AST) 22 Final    BILIRUBIN TOTAL 0.5 Final    ALANINE TRANSFERASE (ALT) 29 Final     BILIRUBIN DIRECT 0.1 Final   LIPASE     Status: None   Result Value Status    LIPASE 27 Final   B-TYPE NATRIURETIC PEPTIDE     Status: Abnormal   Result Value Status    B-TYPE NATRIURETIC PEPTIDE 145 (H) Final   TROPONIN I     Status: None   Result Value Status    TROPONIN I <0.01 Final   URINALYSIS AND CULTURE IF IND     Status: Abnormal   Result Value Status    COLLECTION Clean Catch Final    COLOR Yellow Final    CLARITY Clear Final    SPECIFIC GRAVITY 1.014 Final    pH URINE 7.5 Final    OCCULT BLOOD URINE SMALL (Abnl) Final    BILIRUBIN URINE Negative Final    KETONES Trace (Abnl) Final    GLUCOSE URINE 300 Final    PROTEIN URINE >=500 (Abnl) Final    UROBILINOGEN. Negative Final    NITRITE URINE Negative Final    LEUK. ESTERASE Negative Final    MICROSCOPIC INDICATED Final    WBC 2 Final    RBC 14 (H) Final    SQUAMOUS EPI 1 Final    TRANS EPI <1 Final    URINE CULTURE Not Indicated Final   BLD GAS VENOUS     Status: None   Result Value Status    PO2, VEN 42 Final    O2 SAT, VEN 77 Final    PCO2, VEN 41 Final    pH, VEN 7.40 Final    HCO3, VEN 25 Final    BASE EXCESS, VEN 1 Final    INTERPRETATION, VEN SEE COMMENT Final   LACTIC ACID, WHOLE BLD VENOUS     Status: None   Result Value Status    LACTIC ACID, WHOLE BLD VENOUS 0.9 Final       Pertinent imaging results (reviewed and interpreted independently by me):   CHEST 2 VIEWS   No PNA nor PTX, pulmonary edema    Radiology reads:   CHEST 2 VIEWS  Interval increased size of the cardiac silhouette. This may relate to CHF  given moderate pulmonary edema and small pleural effusions, with possible  pericardial effusion. Recommend correlation with echocardiography.    ECG (reviewed and interpreted independently by me):  Rate: 99  Rhythm: sinus  Axis: leftward  no ST segment changes  T-wave inversions AVR, AVL, with no prior for comparison  PR 144  QRS 78  QTc 446  Overall sinus rhythm    Consults: A Consult was obtained from the GM service to evaluate for admission.  They recommend admission.      Pertinent medications:   Cefepime and vancomycin  Lasix    Point of care emergency department limited cardiac ultrasound:    Indications:  Concern for pericardial effusion    Procedure in Detail:  Using a phased array transducer, cardiac imaging was performed using subxiphoid, parasternal long, parasternal short and apical windows.  Imaging showed the presence of a trace pericardial effusion and moderately depressed global left ventricular function.  The inferior vena cava was interrogated in the right parasaggital plane and was with  respiratory variation.    Impression:  Point of care limited bedside echocardiography with presence of a trace pericardial effusion and moderately depressed global left ventricular function.    These images were archived digitally and I independently interpreted the images at the bedside and agree with the documented results.        Chart Review: I reviewed the patient's prior medical records. Pertinent information that is relevant to this encounter - scheduled for ophthalmology surgery today.    Patient Summary:   Leslie Hernandez is a 20yr female presenting with shortness of breath, not feeling well. On arrival she is hypertensive, otherwise hemodynamically stable. History is remarkable for 2 weeks of malaise, 2 days of productive cough, chills. Physical exam is remarkable for no focal lung sound abnormalities, guaic negative brown stool in rectal vault. Workup is remarkable for pericardial effusion upon chest CXR and bedside US with trace pericardial effusion, no tamponade. At this time, presentation is most consistent with pulmonary edema, possible cardiorenal syndrome, possible health care associated pneumonia. Cultures drawn and antibiotics given as she has been having home fevers. Lasix given as she has evidence of pulmonary edema. Medicine is consulted and plans to admit her to their service for ongoing treatment, serial assessments. Plan of care  discussed with patient at the bedside, questions were sought and answered, amenable to plan. Admit to medicine.     LAST VITAL SIGNS:  Temp: 37.2 C (99 F) (10/20/2015 10:15 PM)  Temp src: Oral (10/20/2015 10:15 PM)  Pulse: 99 (10/20/2015 10:15 PM)  BP: 163/65 (10/20/2015 10:15 PM)  Resp: 17 (10/20/2015 10:15 PM)  SpO2: 96 % (10/20/2015 10:15 PM)  Height: 1.702 m (5\' 7" ) (10/20/2015  1:49 PM)  Weight: 118.3 kg (260 lb 12.9 oz) (10/20/2015  1:49 PM)     Clinical Impression:   1. Shortness of breath    2. Pulmonary Edema  3. Pericardial Effusion  4. CKD      Disposition: Admit      PATIENT'S GENERAL CONDITION:  Serious: Vital signs may be unstable and not within normal limits. Patient is acutely ill. Indicators are questionable.    SCRIBE STATEMENT  I, Vista Deck, SCRIBE,  am personally taking down the notes in the presence of Dr. Cheron Schaumann M.D.  Electronically signed by - Vista Deck, SCRIBE, Scribe  10/20/2015  16:31    SCRIBE DISCLAIMER   I, Charlett Blake, MD, personally performed the services described in this documentation, as scribed by the trained medical scribe above in my presence, and it is both accurate and complete.     Electronically signed by: Charlett Blake, MD, Resident  This patient was seen, evaluated, and care plan was developed with the resident.  I agree with the findings and plan as outlined in our combined note. I personally independently visualized the images and tracings as noted above.      Debbora Dus, MD      Electronically signed by: Debbora Dus, MD, Attending Physician

## 2015-10-20 NOTE — ED Triage Note (Signed)
Pt was sent down by PACU, no eye surgery due to pt not feeling well and INR. Pt c/o CP, SOB, Cough, flank pain.

## 2015-10-20 NOTE — ED Nursing Note (Signed)
Received report from Atlee AbideKatherine Daniels RN, assumed pt care.

## 2015-10-20 NOTE — Progress Notes (Signed)
[facility]    Deneka Leslie MenLacey is a [age]yr old [facility] to treat ghost cell glaucoma. Has vitreous hemorrhage due to proliferative diabetic retinopathy s/p pars planar vitrectomy and had IOPs in 50s on maximum tolerated medical therapy requiring anterior chamber taps x 2 this week for intraocular pressure control. Patient also is on coumadin chronically for history of PE and last INR 2 days ago was 2.7. Exam noteworthy for no fundus view right eye due to dense vitreous hemorrhage and elevated INTRAOCULAR PRESSURE OU which is controlled left eye after restarting glaucoma therapy (glaucoma drops stopped left eye x few days this weeks as per patient). No neovascularization of the iris to suggest neovascular glaucoma. Patient remains on regular dose of coumadin despite planned surgery.    B-scan right eye 10/20/2015: no retinal detachment or choroidal detachment     Retinal images reviewed and stored in [facility] of adequate quality     Impressions:  1. Proliferative diabetic retinopathy OU s/p pars planar vitrectomy right eye and panretinal laser photocoagulation OU  right eye dense vitreous hemorrhage   Left eye; inactive proliferative diabetic retinopathy     2. 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 ([facility])  - [facility]     Patient seen and examined.  Agree with above findings and recommendations as outlined together.  Dylanie Quesenberry S. Alicha Raspberry, MD  PhD  Vitreo-retinal Specialist  Professor

## 2015-10-20 NOTE — Anesthesia Preprocedure Evaluation (Addendum)
Anesthesia Evaluation    History of Present Illness  50 year old female scheduled for right eye surgery today    PMHX: c/o URI- + productive cough x1 week, denies fever. DM II -x 30 years, uses insulin, glucose 100's at baseline. HTN, coumadin h/o DVT/PE- 3 years ago, patient states she takes "20mg  coumadin each night". Ghost cell glaucoma, CKD IV (K 4.1 per [facility]), fractured left foot in boot (non weightbearing)- wheelchair bound, neuropathy- on gabapentin    INR 4.87, case cancelled per Dr. [facility]   Mallampati: II  TM distance: >3 FB     Neck ROM is full.     Dental - no notable dental history.   (+) full dentures   Pulmonary   (+) asthma, recent URI (),     Pulmonary ROS comment: +asthma, uses inhaler (albuterol), used O2 2 nights ago at her SNF. Recent URI, + cough  Pulmonary PE comment: Coarse rhonchi throughout Cardiovascular   (+) hypertension (), valvular problems/murmurs (), hypertriglyceridemia, CHF, orthopnea,   (-) past MI, CABG/stent    Rhythm: regular  Rate: normal  Cardiovascular ROS comment: IVC filter. Orthopnea- 4 pillows at night. 3+ pitting edema  Patient has poor exercise tolerance.   Neuro/Psych - negative for neuro conditions with ROS    GI/Hepatic/Renal   (+) GERD (), chronic renal disease (CKD),   (-) liver disease     Endo/Other    (+) diabetes mellitus well controlled type 2 using insulin, ,                        Anesthesia Plan    ASA 4 - emergent     general     intravenous induction   Anesthetic plan and risks discussed with patient.  Resident/Fellow/CRNA discussed the plan with the attending.  I personally performed a physical assessment on this patient.

## 2015-10-20 NOTE — ED Nursing Note (Signed)
Pt from pacu, back from xray now for labs/rn assess, pt from snf has a left foot fx, c/o cough and sob x3d w/ n/v, lungs w/ coarse bs upper lobes and diminished at bases, mae, pulses x4, gcs 15, ble edema noted 2+

## 2015-10-20 NOTE — Progress Notes (Signed)
[facility]  VITREORETINAL SERVICE  NEW PATIENT FELLOW NOTE    [facility] (Dr. [facility]as[facility])    Leslie Hernandez is a [age]yr old female who presents for evaluation of ghost cell glaucoma OD. Patient notes decreased vision OD x 1 weeks with onset of floaters. [facility] with elevated IOP OD to ~50, with RBCs in the anterior chamber and no NVI/NVA, suggesting ghost cell glaucoma. Patient was re-started on glaucoma eye drops and underwent AC tap. She subsequently underwent AC taps on 5/18 and 5/19 but IOP was not able to be controlled. She takes warfarin for history of PE, and cannot take Diamox due to stage IV CKD. Patient notes no change in vision today compared to yesterday. No eye pain.    Past Ocular History:  Pseudophakia BOTH EYES  PDR BOTH EYES s/p TRD repair OD, s/p PPV/MP/EL OS  Glaucoma on topical therapy (pt also notes h/o glaucoma surgery, but unclear on details)    Current Eye Medications:  Cosopt BOTH EYES BID  Latanoprost BOTH EYES QHS  Alphagan BOTH EYES TID    Family History:  No family history of age-related macular degeneration, retinal tears or detachment.    Social History: non-contributory; currently lives in assisted living facility    Exam  See exam template    Studies    B scan OD 10/20/2015: vitreous debris, retina attached    Impression:  1. Possible ghost cell glaucoma OD  - presence of vitreous hemorrhage and tan-colored cells in anterior chamber with elevated IOP not controlled on medical therapy and prior AC taps x 3 ([facility])  - no evidence of neovascular glaucoma  - patient also with concurrent primary open angle glaucoma and elevated IOP OS today, responded to Cosopt/Alphagan given in clinic    2. Proliferative diabetic retinopathy OU  OD: s/p TRD [place]  OS: s/p PPV/MP/EL 09/2014 (Dr. Dicie BeamKu); appears stable today    3. Pseudophakia OU    Plan:  - Plan for PPV, possible EL OD today    Quin HoopSophia Almyra Birman, MD  Fellow,  Vitreoretinal Surgery  Department of Ophthalmology and Vision Science

## 2015-10-20 NOTE — ED Nursing Note (Signed)
Pt up to bsc.  Reports ongoing left ankle pain.  Placed back in bed

## 2015-10-20 NOTE — H&P (Addendum)
INTERNAL MEDICINE ADMISSION ADULT HISTORY & PHYSICAL  Date of Admission:   10/20/2015  3:56 PM Patient's PCP: Charlett Noseoy A Berry III               CHIEF COMPLAINT:  Dyspnea    HISTORY OF PRESENT ILLNESS:    50 year old woman with history of DM2, CKD4, PE on warfarin, dCHF, HTN, presenting with dyspnea.  She was in her usual state of health until about 1.5 weeks ago when she developed right eye spots/flutters that progressed to complete blindness.  About a week ago she developed a productive cough, nausea/vomiting, sweats, chills, rhinorrhea, and fatigue.  She had episodes of PND which would be relieved with supplemental O2 and associated with hypertension (200s/100s).  She also had decreased UOP, BLE edema, as well as constipation with her last BM 2 days prior to admission that required an enema.  She denies fevers, sick contacts, chest pain, abdominal pain, dysuria.    PMH  DM2  CKD4  PE (saddle emoblus) s/p IVC filter on warfarin  dCHF  HTN  HLP  Iron deficiency anemia  PTSD  Nephrolithiasis  Glaucoma, vitreous hemorrhage, proliferative diabetic retinopathy    PSH  Glaucoma surgery    FH  Mom - leukemia    SH  No tobacco, alcohol, or drugs  [facility] prolonged hospitalization and foot fracture, previously lived at home alone  Previously worked as caregiver    Allergies:   Metformin    Diarrhea  Vicodin [Hydrocodone-Acetaminophen]    Nausea/Vomiting    Prior to Admission Medications:    Prior to Admission Medications   Prescriptions Last Dose Informant Patient Reported? Taking?   Amlodipine (NORVASC) 10 mg Tablet   Yes No   Sig: Take 10 mg by mouth every day. Indications: hypertension   Gabapentin (NEURONTIN) 100 mg Capsule   Yes No   Sig: Take 100 mg by mouth 3 times daily. Indications: Neuropathic Pain   HydrALAZINE (APRESOLINE) 25 mg Tablet   Yes No   Sig: Take 25 mg by mouth 3 times daily. Indications: hypertension   INSULIN LISPRO (HUMALOG KWIKPEN SC)   Yes No   Sig: Inject subcutaneously.  Ss insulin   INSULIN NPH HUMAN ISOPHANE (NPH INSULIN HUMAN RECOMB SC)   Yes No   Sig: Inject 10 Units subcutaneously every day at bedtime.   Warfarin (COUMADIN) 10 mg Tablet   Yes No   Sig: Take 20 mg by mouth every day. Indications: pulmonary thromboembolism      Facility-Administered Medications: None        ROS:  All other systems negative except as noted in the HPI.    VITAL SIGNS:  Summary  Temp Min: 37.5 C (99.5 F) Max: 37.5 C (99.5 F)  BP: (170-175)/(81-102)   Pulse Min: 91 Max: 92  Resp Min: 22 Max: 22  SpO2 Min: 95 % Max: 97 %       Current Vitals  Temp: 37.5 C (99.5 F)  BP: 170/81  Pulse: 91  Resp: 22  SpO2: 97 %         There is no height or weight on file to calculate BSA.  There is no height or weight on file to calculate BMI.    PHYSICAL EXAM:  General Appearance: alert, no distress  Eyes: PERRL, EOMI, right sclera injected  Mouth: upper and lower dentures  Neck: supple, no adenopathy, elevated JVP  Heart: normal rate and regular rhythm, no murmurs, clicks, or  gallops  Lungs: decreased breath sounds at bases  Abdomen: BS normal, soft, NT/ND  Extremities: 2+ BLE edema and distal pulses normal  Neuro: CN II-XII intact, sensation and strength grossly normal  Mental Status: oriented x 3  Skin: no rashes or lesions    LAB TESTS/STUDIES  I personally reviewed the following: labs, images, reports    Lab Results - 24 hours (excluding micro and POC)   CBC WITH DIFFERENTIAL     Status: Abnormal   Result Value Status    WHITE BLOOD CELL COUNT 5.7 Final    RED CELL COUNT 4.01 Final    HEMOGLOBIN 8.7 (L) Final    HEMATOCRIT 28.5 (L) Final    MCV 71.0 (L) Final    MCH 21.8 (L) Final    MCHC 30.6 (L) Final    RDW 19.2 (H) Final    MPV 6.9 Final    PLATELET COUNT 276 Final    NEUTROPHILS % AUTO 66.0 Final    LYMPHOCYTES % AUTO 19.6 Final    MONOCYTES % AUTO 10.1 Final    EOSINOPHIL % AUTO 3.8 Final    BASOPHILS % AUTO 0.5 Final    NEUTROPHIL ABS AUTO 3.70 Final    LYMPHOCYTE ABS AUTO 1.1 Final     MONOCYTES ABS AUTO 0.6 Final    EOSINOPHIL ABS AUTO 0.2 Final    BASOPHILS ABS AUTO 0 Final   BASIC METABOLIC PANEL     Status: Abnormal   Result Value Status    SODIUM 137 Final    POTASSIUM 4.1 Final    CHLORIDE 106 Final    CARBON DIOXIDE TOTAL 23 (L) Final    UREA NITROGEN, BLOOD (BUN) 44 (H) Final    CREATININE BLOOD 3.58 (H) Final    E-GFR, AFRICAN AMERICAN 16 (L) Final    E-GFR, NON-AFRICAN AMERICAN 14 (L) Final    GLUCOSE 118 (H) Final    CALCIUM 8.0 (L) Final   HEPATIC FUNCTION PANEL     Status: Abnormal   Result Value Status    PROTEIN 5.5 (L) Final    ALBUMIN 1.9 (L) Final    ALKALINE PHOSPHATASE (ALP) 187 (H) Final    ASPARTATE TRANSAMINASE (AST) 22 Final    BILIRUBIN TOTAL 0.5 Final    ALANINE TRANSFERASE (ALT) 29 Final    BILIRUBIN DIRECT 0.1 Final   LIPASE     Status: None   Result Value Status    LIPASE 27 Final   B-TYPE NATRIURETIC PEPTIDE     Status: Abnormal   Result Value Status    B-TYPE NATRIURETIC PEPTIDE 145 (H) Final   TROPONIN I     Status: None   Result Value Status    TROPONIN I <0.01 Final   BLD GAS VENOUS     Status: None   Result Value Status    PO2, VEN 42 Final    O2 SAT, VEN 77 Final    PCO2, VEN 41 Final    pH, VEN 7.40 Final    HCO3, VEN 25 Final    BASE EXCESS, VEN 1 Final    INTERPRETATION, VEN SEE COMMENT Final   LACTIC ACID, WHOLE BLD VENOUS     Status: None   Result Value Status    LACTIC ACID, WHOLE BLD VENOUS 0.9 Final   URINALYSIS AND CULTURE IF IND     Status: Abnormal   Result Value Status    COLLECTION Clean Catch Final    COLOR Yellow Final    CLARITY  Clear Final    SPECIFIC GRAVITY 1.014 Final    pH URINE 7.5 Final    OCCULT BLOOD URINE SMALL (Abnl) Final    BILIRUBIN URINE Negative Final    KETONES Trace (Abnl) Final    GLUCOSE URINE 300 Final    PROTEIN URINE >=500 (Abnl) Final    UROBILINOGEN. Negative Final    NITRITE URINE Negative Final    LEUK. ESTERASE Negative Final    MICROSCOPIC INDICATED Final    WBC 2 Final    RBC 14 (H) Final    SQUAMOUS EPI 1 Final     TRANS EPI <1 Final    URINE CULTURE Not Indicated Final     ECG: rate 99, sinus rhythm, normal axis, no ischemic changes, no prior for comparison    CXR  Interval increased size of the cardiac silhouette. This may relate to CHF  given moderate pulmonary edema and small pleural effusions, with possible  pericardial effusion. Recommend correlation with echocardiography.    ASSESSMENT & PLAN:    Acute on chronic dCHF  Pericardial effusion  Last TTE in 03/2014 with preserved EF, volume overload likely due in part to HTN and worsening CKD, receiving lasix at SNF (unsure of dose but 40 daily per outside records) and received lasix IV in clinic recently, small pericardial effusion seen in ED likely due to volume overload  -lasix 40 x 1 and assess for response  -BP control  -TTE  -fluid restriction, daily weight    Cough, n/v, fatigue, chills/sweats, rhinorrhea  Consistent with viral illness, afebrile with no leukocytosis, ED empirically treated with cefepime and vancomycin  -RVP  -blood cultures  -hold off on further antibiotics unless symptoms worsen or evidence of bacterial infection    AKI on CKD  Baseline creatinine 2.3-3, likely worsened due to decreased PO intake from n/v  -monitor renal function  -renally dose meds and avoid nephrotoxins    HTN  Hypertensive on admission, possibly due to volume overload  -diuresis as above  -amlodipine, hydralazine, hydralazine prn    DM2 c/b neuropathy, nephropathy, retinopathy  Last A1c 9.8 in 08/2015, on scheduled regular insulin with meals and ISS at bedtime  -ISS  -A1c  -reduced dose gabapentin given AKI    PE (saddle embolus) s/p IVC filter  Supratherapeutic INR  INR 2.75 on 5/18, received warfarin yesterday at SNF, unclear dose, no evidence of bleeding.  PE diagnosed 3 years ago at [facility] and IVC filter placed at that time and plan for lifetime anticoagulation given severity of thrombus, VQ scan in 07/2015 with no evidence of acute or chronic PE.  -monitor  INR  -warfarin per clot pharmacy  -discuss with ophtho regarding goal INR    Glaucoma, vitreal hemorrhage, diabetic retinopathy, left eye blindness  Transferred to [place] for urgent retinal surgery for elevated IOP, underwent anterior chamber tap with improved IOP at [facility], due to have surgery today but held off due to elevated INR  -follow up with ophtho  -brimonidine, latanoprost, cosopt    Constipation  Last BM 2 days ago and required enema, no evidence of obstruction, no prior abdominal surgeries, benign abd exam with bowel sounds  -aggressive bowel regimen    Iron deficiency anemia  Hemoglobin at baseline of ~8, no evidence of blood loss, receives iron infusions (last dose 5/18), last iron studies in 08/2015 with ferritin 286 and Fe % saturation 15  -monitor CBC  -iron studies    Left foot fracture  Recent Charcot's neuropathic  osteoarthropathy left foot versus neuropathic fracture of the navicular bone and fifth metatarsal.  Seen by ortho with worsening osteonecrosis on xray  -strict NWB  -CAM boot  -PT/OT    Vulvar ulcerated lesion  Seen by gynecology on 5/16, swab positive for HSV 2, no Rx found in outside records for acyclovir or other antiviral  -consider starting acyclovir or other antiviral if renal function stable  -follow up with gynecology    FEN: renal/diabetic diet  DVT Prophylaxis: warfarin  Code status: Full  Dispo: SNF when medically stable    PRESENT ON ADMISSION:  Are any of the following five conditions present or suspected on admission: decubitus ulcer, infection from an intravascular device, infection due to an indwelling catheter, surgical site infection or pneumonia? No.      Report electronically signed by:  Sanjuana Kava, MD  Internal Medicine Attending  Pager: [phone]

## 2015-10-20 NOTE — ED Nursing Note (Signed)
Patient admitted to E8. Documentation updated and complete. Patient report communicated via Vocera. Patient transported via gurney by Patient Escort. Patient belongings kept by patient.

## 2015-10-20 NOTE — Nurse Focus (Signed)
PREOP ADMIT NURSING NOTE    Note Started: 10/20/2015, 13:50     Received patient from as a direct admit at 1300 hours via wheelchair.  Patient status  awake and alert.  Oriented to room and unit.  Admission assessment and care plan initiated.   PIN number cards provided to patient.   Posey ProntoErin Marelyn Rouser, RN

## 2015-10-20 NOTE — ED Nursing Note (Signed)
Report to Tinnie GensJeffrey, Charity fundraiserN. Pt to A08. Relinquished care.

## 2015-10-20 NOTE — Nurse Focus (Signed)
Pt surgical case canceled by surgeon d/t pt elevated INR. Pt c/o a cold, feeling weak and SOB, and feels like she needs to stay in hospital. MD made aware, awaiting decision from surgical team. Per surgical team take pt through triage area of ER. MD has spoken with ER charge RN. ER charge RN aware. Will transport pt to ER.

## 2015-10-21 LAB — CBC WITH DIFFERENTIAL
BASOPHILS % AUTO: 0.5 %
BASOPHILS ABS AUTO: 0 10*3/uL (ref 0–0.2)
EOSINOPHIL % AUTO: 4.7 %
EOSINOPHIL ABS AUTO: 0.2 10*3/uL (ref 0–0.5)
HEMATOCRIT: 28.9 % — AB (ref 34–46)
HEMOGLOBIN: 8.7 g/dL — AB (ref 12.0–16.0)
LYMPHOCYTE ABS AUTO: 1.2 10*3/uL (ref 1.0–4.8)
LYMPHOCYTES % AUTO: 25.5 %
MCH: 21.3 pg — AB (ref 27–33)
MCHC: 30.2 % — AB (ref 32–36)
MCV: 70.6 UM3 — AB (ref 80–100)
MONOCYTES % AUTO: 11.1 %
MONOCYTES ABS AUTO: 0.5 10*3/uL (ref 0.1–0.8)
MPV: 6.8 UM3 (ref 6.8–10.0)
NEUTROPHIL ABS AUTO: 2.8 10*3/uL (ref 1.80–7.70)
NEUTROPHILS % AUTO: 58.2 %
PLATELET COUNT: 288 10*3/uL (ref 130–400)
RDW: 18.9 U — AB (ref 0–14.7)
RED CELL COUNT: 4.09 10*6/uL (ref 3.7–5.5)
WHITE BLOOD CELL COUNT: 4.8 10*3/uL (ref 4.5–11.0)

## 2015-10-21 LAB — BASIC METABOLIC PANEL
CALCIUM: 8.1 mg/dL — AB (ref 8.6–10.5)
CARBON DIOXIDE TOTAL: 23 meq/L — AB (ref 24–32)
CHLORIDE: 107 meq/L (ref 95–110)
CREATININE BLOOD: 3.65 mg/dL — AB (ref 0.44–1.27)
E-GFR, AFRICAN AMERICAN: 16 SEE NOTE — AB (ref >60)
E-GFR, NON-AFRICAN AMERICAN: 14 — AB (ref >60)
GLUCOSE: 120 mg/dL — AB (ref 70–99)
POTASSIUM: 4.1 meq/L (ref 3.3–5.0)
SODIUM: 140 meq/L (ref 135–145)
UREA NITROGEN, BLOOD (BUN): 42 mg/dL — AB (ref 8–22)

## 2015-10-21 LAB — ECHOCARDIOGRAM COMPLETE
IVSD 2D: 1.4 cm (ref 0.6–0.9)
LEFT INTERNAL DIMENSION IN SYSTOLE: 2.33 cm
LEFT VENTRICULAR INTERNAL DIMENSION IN DIASTOLE: 3.9 cm (ref 3.8–5.2)
LVEF (EST): 65 %
MV E-WAVE VEL/E'TISSUE VEL LAT: 10.1 E/A
MV VALVE AREA P 1/2 METHOD: 4.06 cm2
POSTERIOR WALL: 1.63 cm (ref 0.6–0.9)
TAPSE: 2.9 cm
TV PEAK SYSTOLIC PULMONARY ARTERY PRESSURE: 44.99 mmHg

## 2015-10-21 LAB — IRON TOTAL: IRON TOTAL: 26 ug/dL — AB (ref 42–135)

## 2015-10-21 LAB — PHOSPHORUS (PO4): PHOSPHORUS (PO4): 4.7 mg/dL (ref 2.4–5.0)

## 2015-10-21 LAB — FERRITIN: FERRITIN: 844 ng/mL — AB (ref 10–291)

## 2015-10-21 LAB — TRANSFERRIN
IRON PERCENT SATURATION: 16 % (ref 15–50)
TOTAL IRON BINDING CAPACITY: 163 ug/dL — AB (ref 280–400)
TRANSFERRIN: 117 mg/dL — AB (ref 192–382)

## 2015-10-21 LAB — HEMOGLOBIN A1C
HGB A1C,GLUCOSE EST AVG: 171 mg/dL
HGB A1C: 7.6 % — AB (ref 3.9–5.6)

## 2015-10-21 LAB — INR: INR: 3.99 — AB (ref 0.87–1.18)

## 2015-10-21 LAB — MAGNESIUM (MG): MAGNESIUM (MG): 2.1 mg/dL (ref 1.5–2.6)

## 2015-10-21 MED ORDER — FUROSEMIDE 10 MG/ML INJECTION SOLUTION
40.0000 mg | Freq: Once | INTRAMUSCULAR | Status: AC
Start: 2015-10-21 — End: 2015-10-21
  Administered 2015-10-21: 40 mg via INTRAVENOUS
  Filled 2015-10-21: qty 4

## 2015-10-21 NOTE — Nurse Focus (Signed)
Patient needs early mobility eval, per MD will need that for safety eval when OOB. Patient states she is unable to use bedpan due to "psychological reasons", complained of bladder fullness. She prefers OOB. Dr. Donney RankinsSignoff is aware with order to bladder scan Q 6 hours. And if unable, may do I&O cath. Bladder scan result= >999 ml this AM, I&O cath done with result of 1300 ml. Patient verbalized relief. Marylin CrosbyValerie Castulo Scarpelli, RN

## 2015-10-21 NOTE — Nurse Focus (Signed)
ASSESSMENT NOTE    Note Started: 10/21/2015, 07:12     Report received from night shift nurse and orders reviewed. Plan of Care reviewed and appropriate, patient is asleep at this time, she is on room air. Per report, she refuses to use bedpan and would want to use BSC with x 3 person assist. Will need lift team. Marylin CrosbyValerie Adryen Cookson, RN

## 2015-10-21 NOTE — Nurse Assessment (Signed)
ASSESSMENT NOTE    Note Started: 10/21/2015, 08:00     Initial assessment completed and recorded in EMR. Plan of Care reviewed and discussed with patient.  Leslie CrosbyValerie Janei Scheff, RN

## 2015-10-21 NOTE — Progress Notes (Signed)
INTERNAL MEDICINE DAILY PROGRESS NOTE  Date: 10/21/2015  Time:  15:12      INTERVAL HISTORY:  No overnight events.    SUBJECTIVE:  Patient reports ongoing mild abdom pain.  Reports blindness right eye ~2 weeks.  Patient with poor mobility, but concern about using bedpan.  Eventually devised plan that's less than idea that patient either goes in bedpan or with bladder scans will have to straight cath as needed.      MEDICATIONS:  Scheduled Medications  Amlodipine (NORVASC) Tablet 10 mg, ORAL, QAM  Atorvastatin (LIPITOR) Tablet 80 mg, ORAL, Daily Bedtime  Brimonidine (ALPHAGAN) 0.2 % Ophthalmic Solution 1 drop, BOTH Eyes, TID  Docusate (COLACE) Capsule 100 mg, ORAL, BID  Dorzolamide 2%/Timolol 0.5% (COSOPT) Ophthalmic Solution 1 drop, BOTH Eyes, BID  Gabapentin (NEURONTIN) Capsule 100 mg, ORAL, TID  HydrALAZINE (APRESOLINE) Tablet 100 mg, ORAL, TID  Insulin Aspart (NOVOLOG) Injection Pen 1-4 Units, SUBCUTANEOUS, Daily Bedtime  Insulin Aspart (NOVOLOG) Injection Pen 1-6 Units, SUBCUTANEOUS, TID w/ meals - correction dose  Latanoprost (XALATAN) 0.005 % Ophthalmic Solution 1 drop, BOTH Eyes, Daily Bedtime  Polyethylene Glycol 3350 (MIRALAX) Oral Powder Packet 17 g, ORAL, QAM  Sennosides (SENOKOT) Tablet 17.2 mg, ORAL, Daily Bedtime  Warfarin Patient Flag, NOT APPLICABLE, PATIENT FLAG ONLY    IV Medications   PRN Medications  Acetaminophen (TYLENOL) Tablet 650 mg, ORAL, Q4H PRN  Bisacodyl (DULCOLAX) Suppository 10 mg, RECTALLY, Q24H PRN  Dextrose 50% Injection 12.5 g, IV, PRN  Dextrose 50% Injection 25 g, IV, PRN  Glucagon Injection 1 mg, IM, PRN  HydrALAZINE (APRESOLINE) Injection 10 mg, IV, Q4H PRN  Lactulose (CHRONULAC) 10 gram/15 mL Solution 30 mL, ORAL, Q6H PRN  Mineral Oil (FLEET) Enema 1 enema, RECTALLY, Bedtime PRN  Ondansetron (ZOFRAN) Injection 4 mg, IV, Q8H PRN        OBJECTIVE:   Vital Signs  Summary  Temp Min: 36.6 C (97.9 F) Max: 37.5 C (99.5 F)  BP: (147-195)/(65-141)   Pulse Min: 90 Max: 103  Resp  Min: 14 Max: 22  SpO2 Min: 95 % Max: 99 %      Current Vitals  Temp: 37 C (98.6 F)  BP: (!) 168/95  Pulse: 95  Resp: 18  SpO2: 99 %      Weight: 114.4 kg (252 lb 3.3 oz)     Intake and Output  Last Two Completed Shifts  In: 240 [Oral:240]  Out: -     Current Shift  In: -   Out: 1300 [Urine:1300]      Physical Exam  General: obese female, No acute distress, conversational. A+Ox3  HEENT:  Moist oral mucosa.  Right sclera injected and globe mild protrusion  CV: Regular rate and rhythm, nl s1s2, no murmurs, rubs or gallops. No JVD.  Pulm: bibasilar crackles,  good symmetric air movement  Abd: Soft NT ND, +BS. No rebound or guarding  Skin: No rashes. No jaundice.  Neuro: face symmetric, moving all 4 extremities spontaneously  Extrem:  2+ LE edema.      LINES AND DRAINS:   piv    LAB TESTS/STUDIES:     I personally reviewed the following report(s)  .    Lab Results - 24 hours (excluding micro and POC)   CBC WITH DIFFERENTIAL     Status: Abnormal   Result Value Status    WHITE BLOOD CELL COUNT 5.7 Final    RED CELL COUNT 4.01 Final    HEMOGLOBIN 8.7 (L) Final  HEMATOCRIT 28.5 (L) Final    MCV 71.0 (L) Final    MCH 21.8 (L) Final    MCHC 30.6 (L) Final    RDW 19.2 (H) Final    MPV 6.9 Final    PLATELET COUNT 276 Final    NEUTROPHILS % AUTO 66.0 Final    LYMPHOCYTES % AUTO 19.6 Final    MONOCYTES % AUTO 10.1 Final    EOSINOPHIL % AUTO 3.8 Final    BASOPHILS % AUTO 0.5 Final    NEUTROPHIL ABS AUTO 3.70 Final    LYMPHOCYTE ABS AUTO 1.1 Final    MONOCYTES ABS AUTO 0.6 Final    EOSINOPHIL ABS AUTO 0.2 Final    BASOPHILS ABS AUTO 0 Final   BASIC METABOLIC PANEL     Status: Abnormal   Result Value Status    SODIUM 137 Final    POTASSIUM 4.1 Final    CHLORIDE 106 Final    CARBON DIOXIDE TOTAL 23 (L) Final    UREA NITROGEN, BLOOD (BUN) 44 (H) Final    CREATININE BLOOD 3.58 (H) Final    E-GFR, AFRICAN AMERICAN 16 (L) Final    E-GFR, NON-AFRICAN AMERICAN 14 (L) Final    GLUCOSE 118 (H) Final    CALCIUM 8.0 (L) Final    HEPATIC FUNCTION PANEL     Status: Abnormal   Result Value Status    PROTEIN 5.5 (L) Final    ALBUMIN 1.9 (L) Final    ALKALINE PHOSPHATASE (ALP) 187 (H) Final    ASPARTATE TRANSAMINASE (AST) 22 Final    BILIRUBIN TOTAL 0.5 Final    ALANINE TRANSFERASE (ALT) 29 Final    BILIRUBIN DIRECT 0.1 Final   LIPASE     Status: None   Result Value Status    LIPASE 27 Final   B-TYPE NATRIURETIC PEPTIDE     Status: Abnormal   Result Value Status    B-TYPE NATRIURETIC PEPTIDE 145 (H) Final   TROPONIN I     Status: None   Result Value Status    TROPONIN I <0.01 Final   BLD GAS VENOUS     Status: None   Result Value Status    PO2, VEN 42 Final    O2 SAT, VEN 77 Final    PCO2, VEN 41 Final    pH, VEN 7.40 Final    HCO3, VEN 25 Final    BASE EXCESS, VEN 1 Final    INTERPRETATION, VEN SEE COMMENT Final   LACTIC ACID, WHOLE BLD VENOUS     Status: None   Result Value Status    LACTIC ACID, WHOLE BLD VENOUS 0.9 Final   URINALYSIS AND CULTURE IF IND     Status: Abnormal   Result Value Status    COLLECTION Clean Catch Final    COLOR Yellow Final    CLARITY Clear Final    SPECIFIC GRAVITY 1.014 Final    pH URINE 7.5 Final    OCCULT BLOOD URINE SMALL (Abnl) Final    BILIRUBIN URINE Negative Final    KETONES Trace (Abnl) Final    GLUCOSE URINE 300 Final    PROTEIN URINE >=500 (Abnl) Final    UROBILINOGEN. Negative Final    NITRITE URINE Negative Final    LEUK. ESTERASE Negative Final    MICROSCOPIC INDICATED Final    WBC 2 Final    RBC 14 (H) Final    SQUAMOUS EPI 1 Final    TRANS EPI <1 Final    URINE CULTURE Not Indicated  Final   CULTURE BLOOD, BACTI (INCLUDES YEAST)     Status: None (Preliminary result)   Result Value Status    CULTURE BLOOD  Preliminary       CULTURE BLOOD  Preliminary                                                                     NO GROWTH TO DATE                                           CULTURE BLOOD, BACTI (INCLUDES YEAST)     Status: None (Preliminary result)   Result Value Status    CULTURE BLOOD  Preliminary        CULTURE BLOOD  Preliminary                                                                     NO GROWTH TO DATE                                           CBC WITH DIFFERENTIAL     Status: Abnormal   Result Value Status    WHITE BLOOD CELL COUNT 4.8 Final    RED CELL COUNT 4.09 Final    HEMOGLOBIN 8.7 (L) Final    HEMATOCRIT 28.9 (L) Final    MCV 70.6 (L) Final    MCH 21.3 (L) Final    MCHC 30.2 (L) Final    RDW 18.9 (H) Final    MPV 6.8 Final    PLATELET COUNT 288 Final    NEUTROPHILS % AUTO 58.2 Final    LYMPHOCYTES % AUTO 25.5 Final    MONOCYTES % AUTO 11.1 Final    EOSINOPHIL % AUTO 4.7 Final    BASOPHILS % AUTO 0.5 Final    NEUTROPHIL ABS AUTO 2.80 Final    LYMPHOCYTE ABS AUTO 1.2 Final    MONOCYTES ABS AUTO 0.5 Final    EOSINOPHIL ABS AUTO 0.2 Final    BASOPHILS ABS AUTO 0 Final   BASIC METABOLIC PANEL     Status: Abnormal   Result Value Status    SODIUM 140 Final    POTASSIUM 4.1 Final    CHLORIDE 107 Final    CARBON DIOXIDE TOTAL 23 (L) Final    UREA NITROGEN, BLOOD (BUN) 42 (H) Final    CREATININE BLOOD 3.65 (H) Final    E-GFR, AFRICAN AMERICAN 16 (L) Final    E-GFR, NON-AFRICAN AMERICAN 14 (L) Final    GLUCOSE 120 (H) Final    CALCIUM 8.1 (L) Final   MAGNESIUM (MG)     Status: None   Result Value Status    MAGNESIUM (MG) 2.1 Final   PHOSPHORUS (PO4)     Status: None   Result Value Status  PHOSPHORUS (PO4) 4.7 Final   IRON TOTAL     Status: Abnormal   Result Value Status    IRON TOTAL 26 (L) Final   TRANSFERRIN     Status: Abnormal   Result Value Status    TRANSFERRIN 117 (L) Final    TOTAL IRON BINDING CAPACITY 163 (L) Final    IRON PERCENT SATURATION 16.0 Final   FERRITIN     Status: Abnormal   Result Value Status    FERRITIN 844 (H) Final   INR     Status: Abnormal   Result Value Status    INR 3.99 (H) Final       NEW IMAGES:  none  ASSESSMENT & PLAN:    50 year old female past medical history of DM-2, CKD-4, PE on warfarin, dCHF, HTN with CHF exacerbation and subacute vitreal hemorrhage  requiring ophtho retinal surgery however patient with elevated INR.    Acute on chronic heart failure, diastolic  Pericardial effusion  Patient with preserved EF in 2015.  Presented in decompensated heart failure with volume overload requiring iv lasix.  Also with small pericardial effusion likely due to volume overload.  - Lasix 40mg  iv with good response, but unchanged Cr 3.6.  Will give another dose today.  Spot lasix.  - Follow up TTE  - Fluid restriction, daily weight    Cough  rhinorhea  Patient presented with cough and fatigue as well as rhinorhea.  Patient given vanc/cefepime in ED, but no evidence pneumonia and likely viral URI.  Monitor off abx.  - Follow up RVP  - Follow up blood cultures    AKI on CKD  Patient with baseline Cr 2.3.  Presented with Cr 3.5 and remains stable on diuresis.  Monitor for improvement with diuresis.  - renally dose medications and avoid nephrotoxins    HTN  Hypertensive on admission, likely related to volume overload.  Monitor with diuresis.  - cont home amlodipine, hydralazine.  Have hydralazine iv PRN available.    DM2  Peripheral neuropathy  Retinopathy  A1c in march was 9.8  Patient with BG normal with sliding scale alone despite home long acting.  Monitor and discharge on appropriate regimen.  Potentially discontinue home insulin, but will follow as renal function improves.  - sliding scale  - follow up A1C  - Gabapentin reduced to 100mg  TID given renal function    PE (saddle embolus) status post IVC filter  supratherapeutic INR  The PE was diagnosed 3 years ago at [facility] with IVC placed with plan for lifelong AC given unprovoked and severity.  Feb V/Q without evidence of PE.  - Plan to initiate hep gtt once INR <2 in preparation for potential ophtho procedure  - will re-initiate warfarin once evaluated by ophtho and if no plans for procedure    Glaucoma  vitreal hemorrhage  diabetic retinopathy  Right eye blindness  Transferred to [place] for urgent retinal surgery for  elevated IOP, underwent anterior chamber tap with improved IOP at [facility], due to have surgery today but held off due to elevated INR  -follow up with ophtho  -brimonidine, latanoprost, cosopt    Constipation  Last BM 2 days ago and required enema, no evidence of obstruction, no prior abdominal surgeries, benign abd exam with bowel sounds  -aggressive bowel regimen    Iron deficiency anemia  Hemoglobin at baseline of ~8, no evidence of blood loss, receives iron infusions (last dose 5/18), last iron studies in 08/2015 with ferritin  286 and Fe % saturation 15  -monitor CBC  -iron studies    Left foot fracture  Recent Charcot's neuropathic osteoarthropathy left foot versus neuropathic fracture of the navicular bone and fifth metatarsal. Seen by ortho with worsening osteonecrosis on xray  -strict NWB  -CAM boot  -PT/OT    Vulvar ulcerated lesion  Seen by gynecology on 5/16, swab positive for HSV 2, no Rx found in outside records for acyclovir or other antiviral  -consider starting acyclovir or other antiviral if renal function stable  -follow up with gynecology    FEN: renal/diabetic diet  DVT Prophylaxis: warfarin --> hep gtt once INR <2  Code status: Full  Dispo: SNF when medically stable --> patient expressed dissatisfication with [facility]       Approximately 60 minutes was spent with the patient, the majority of which was spent reviewing the records, developing a treatment plan, calling consultant services as needed, and counseling the patient and family on the treatment plan, including risks and benefits of the treatment options.    Lennox Solders, MD  Internal Medicine  Hospitalist  PI: [phone]  Pager: [phone]

## 2015-10-21 NOTE — Plan of Care (Signed)
Problem: Patient Care Overview (Adult)  Goal: Plan of Care Review  Outcome: Ongoing (interventions implemented as appropriate)  Goal Outcome Evaluation Note     Leslie Hernandez is a [age]yr female admitted 10/20/2015      OUTCOME SUMMARY AND PLAN MOVING FORWARD:   Patient seen by Opthalmology team this AM, will be NPO after midnight for possible procedure in AM. Continue to monitor. Marylin CrosbyValerie Shalunda Lindh, RN         Goal: Individualization and Mutuality  Outcome: Ongoing (interventions implemented as appropriate)  Goal: Discharge Needs Assessment  Outcome: Ongoing (interventions implemented as appropriate)    10/21/15 1839   Current Health   Outpatient/Agency/Support Group Needs skilled nursing facility (specify)   Anticipated Changes Related to Illness inability to care for self;inability to care for someone else   Activity/Self Care Review of Systems   Equipment Currently Used at Home walker, standard   Living Environment   Transportation Available agency transportation         Problem: Sleep Pattern Disturbance (Adult)  Goal: Adequate Sleep/Rest  Patient will demonstrate the desired outcomes by discharge/transition of care.   Outcome: Ongoing (interventions implemented as appropriate)    Problem: Urine Elimination, Impaired (Adult)  Goal: Effective Urinary Elimination  Patient will demonstrate the desired outcomes by discharge/transition of care.  Outcome: Ongoing (interventions implemented as appropriate)  I&O cath Q 6 hours ordered. Marylin CrosbyValerie Penne Rosenstock, RN        Problem: Fall Risk (Adult)  Goal: Absence of Falls  Patient will demonstrate the desired outcomes by discharge/transition of care.  Outcome: Ongoing (interventions implemented as appropriate)  No falls on shift. Marylin CrosbyValerie Pamela Intrieri, RN        Problem: Mobility, Physical Impaired (Adult)  Goal: Enhanced Mobility Skills  Patient will demonstrate the desired outcomes by discharge/transition of care.  Outcome: Ongoing (interventions implemented as appropriate)  Patient is for early  mobility program and will be seen in AM. Patient is aware and not able to get OOB today until cleared by PT. Left LE is non-weight bearing. Marylin CrosbyValerie Leandre Wien, RN        Problem: Fluid Volume Excess (Adult,Obstetrics,Pediatric)  Goal: Balanced Intake/Output  Patient will demonstrate the desired outcomes by discharge/transition of care.  Outcome: Ongoing (interventions implemented as appropriate)  Fluid restriction is 1500 ml/ day. Lasix IV 1x given today. Marylin CrosbyValerie Searra Carnathan, RN        Problem: Pain, Acute (Adult)  Goal: Acceptable Pain Control/Comfort Level  Patient will demonstrate the desired outcomes by discharge/transition of care.  Outcome: Ongoing (interventions implemented as appropriate)  Patient's pain is on left side body and LLE, pain is 8/10. Tylenol and Gabapentin given as scheduled/PRN. Continue to monitor pain. Marylin CrosbyValerie Onis Markoff, RN

## 2015-10-21 NOTE — Consults (Addendum)
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 Patient's case was cancelled due to supratherapeutic INR.     Patient reports that her vision has been stable. Denies eye pain. Please see note from 5/20 for additional details    Past Ocular History:   Pseudophakia both eyes   PDR both eyes s/p TRD repair OD, s/p PPV/MP/EL OS  Glaucoma on topical therapy (pt also notes h/o glaucoma surgery, but unclear on details)  Denies h/o incisional, laser, intraocular procedures    Ocular Medications:  Cosopt BOTH EYES BID  Latanoprost BOTH EYES QHS  Alphagan BOTH EYES TID    Family Ocular History:  No family history of age-related macular degeneration, retinal tears or detachment.    Past Medical History:   DM     Medications:    Current Facility-Administered Medications:     Acetaminophen (TYLENOL) Tablet 650 mg, 650 mg, ORAL, Q4H PRN, Sharlett Iles, MD, 650 mg at 10/21/15 0800    Amlodipine (NORVASC) Tablet 10 mg, 10 mg, ORAL, QAM, Sharlett Iles, MD, 10 mg at 10/21/15 0800    Atorvastatin (LIPITOR) Tablet 80 mg, 80 mg, ORAL, Daily Bedtime, Sharlett Iles, MD, 80 mg at 10/20/15 2131    Bisacodyl (DULCOLAX) Suppository 10 mg, 10 mg, RECTALLY, Q24H PRN, Sharlett Iles, MD    Brimonidine (ALPHAGAN) 0.2 % Ophthalmic Solution 1 drop, 1 drop, BOTH Eyes, TID, Sharlett Iles, MD, 1 drop at 10/21/15 1305    Dextrose 50% Injection 12.5 g, 25 mL, IV, PRN, Sharlett Iles, MD    Dextrose 50% Injection 25 g, 50 mL, IV, PRN, Sharlett Iles, MD    Docusate (COLACE) Capsule 100 mg, 100 mg, ORAL, BID, Sharlett Iles, MD, 100 mg at 10/20/15 2131     Dorzolamide 2%/Timolol 0.5% (COSOPT) Ophthalmic Solution 1 drop, 1 drop, BOTH Eyes, BID, Sharlett Iles, MD, 1 drop at 10/21/15 1305    FUROsemide (LASIX) Injection 40 mg, 40 mg, IV, ONCE, Lennox Solders, MD    Gabapentin (NEURONTIN) Capsule 100 mg, 100 mg, ORAL, TID, Sharlett Iles, MD, 100 mg at 10/21/15 1524    Glucagon Injection 1 mg, 1 mg, IM, PRN, Sharlett Iles, MD    HydrALAZINE (APRESOLINE) Injection 10 mg, 10 mg, IV, Q4H PRN, Sharlett Iles, MD    HydrALAZINE (APRESOLINE) Tablet 100 mg, 100 mg, ORAL, TID, Sharlett Iles, MD, 100 mg at 10/21/15 1524    Insulin Aspart (NOVOLOG) Injection Pen 1-4 Units, 1-4 Units, SUBCUTANEOUS, Daily Bedtime, Sharlett Iles, MD, 1 Units at 10/20/15 2200    Insulin Aspart (NOVOLOG) Injection Pen 1-6 Units, 1-6 Units, SUBCUTANEOUS, TID w/ meals - correction dose, Sharlett Iles, MD, 1 Units at 10/21/15 0700    Lactulose (CHRONULAC) 10 gram/15 mL Solution 30 mL, 30 mL, ORAL, Q6H PRN, Sharlett Iles, MD    Latanoprost (XALATAN) 0.005 % Ophthalmic Solution 1 drop, 1 drop, BOTH Eyes, Daily Bedtime, Sharlett Iles, MD, 1 drop at 10/20/15 2132    Mineral Oil (FLEET) Enema 1 enema, 1 enema, RECTALLY, Bedtime PRN, Sharlett Iles, MD    Ondansetron (ZOFRAN) Injection 4 mg, 4 mg, IV, Q8H PRN, Sharlett Iles, MD    Polyethylene  Glycol 3350 (MIRALAX) Oral Powder Packet 17 g, 17 g, ORAL, QAM, Sharlett IlesShaheen Najafi, MD, 17 g at 10/21/15 0900    Sennosides (SENOKOT) Tablet 17.2 mg, 2 tablet, ORAL, Daily Bedtime, Sharlett IlesShaheen Najafi, MD, 17.2 mg at 10/20/15 2132    Warfarin Patient Flag, , NOT APPLICABLE, PATIENT FLAG ONLY, Sharlett IlesShaheen Najafi, MD    Allergies:  Allergies   Allergen Reactions    Metformin Diarrhea    Vicodin [Hydrocodone-Acetaminophen] Nausea/Vomiting       Ophthalmology Exam:  Visual Acuity: on near card, SC OD: CF at 2  OS: 20/50   Intraocular pressure: OD: 28 mm Hg by tonopen  OS: 17 mm Hg by tonopen   Pupils:    OD: 2.5 mm, nonreactive   OS: 2 mm, nonreactive     Portable  Slit Lamp Exam:      Exterior:  OD: Normal   OS: Normal   Lids/Lashes:  OD: Normal   OS: Normal    Conj/Sclera:  OD: White, quiet   OS: White, quiet    Cornea:  OD: Clear  OS: Clear   Anterior Chamber:  OD: Formed, grossly quiet; no cell seen, but limited by portable slit lamp exam  OS: Formed, grossly quiet   Iris:  OD: Round, reactive; no NVI  OS: Round, reactive; no NVI   Lens:  OD: PCIOL  OS: PCIOL       Impression:    1. Proliferative diabetic retinopathy both eyes  - s/p PPV OD and PRP both eyes  2. Vitreous hemorrhage, OD  - 2/2 #1  3. Glaucoma, both eyes  - DDx for recent increase in IOP includes ghost cell glaucoma vs. Medication noncompliance    - VA CF and 20/50  - IOP 28 and 17    Plan:    - will plan for possible PPV pending patient's INR.   - Please page ophtho on-call [phone] with any questions or concerns    Patient was seen independently, and discussed with Dr. Modesto CharonWong. Please see attending's note for final assessment and plan.    Charolette ChildAaron Skelton, MD  Resident Physician, PGY-II  [facility]  Ophthalmology Call Pager: [phone]      I did not see the patient. I reviewed the resident's note, including the exam, assessment and plan. The management seems appropriate.    Quin HoopSophia Corianna Avallone, MD  Fellow, Vitreoretinal Surgery  Department of Ophthalmology and Vision Science

## 2015-10-21 NOTE — Plan of Care (Signed)
Problem: Patient Care Overview (Adult)  Goal: Plan of Care Review  Outcome: Ongoing (interventions implemented as appropriate)  Goal Outcome Evaluation Note     Leslie Hernandez is a 76yrfemale admitted 10/20/2015      OUTCOME SUMMARY AND PLAN MOVING FORWARD:      Pt admitted to E8 around 2210, coming from ED by bed.  Plan of Care reviewed and appropriate, discussed with patient, who demonstrated and verbalized understanding and acceptance. Pt is A&O X4. Complaining of pain in her left ankle. VSS throughout the night shift, BP remained WDL. Pt had One large BM and urine, and was able to get out of bed to use BSC, with the assistance of 3. CHG bath done. Plan is to monitor patient's BP and Urine output. Will continue to monitor.  Goal: Individualization and Mutuality  Outcome: Ongoing (interventions implemented as appropriate)  Goal: Discharge Needs Assessment  Outcome: Ongoing (interventions implemented as appropriate)    Problem: Sleep Pattern Disturbance (Adult)  Goal: Identify Related Risk Factors and Signs and Symptoms  Related risk factors and signs and symptoms are identified upon initiation of Human Response Clinical Practice Guideline (CPG)   Outcome: Outcome(s) achieved Date Met:  10/21/15  Goal: Adequate Sleep/Rest  Patient will demonstrate the desired outcomes by discharge/transition of care.   Outcome: Ongoing (interventions implemented as appropriate)

## 2015-10-21 NOTE — Nurse Assessment (Signed)
ASSESSMENT NOTE    Note Started: 10/21/2015, 20:30     Initial assessment completed and recorded in EMR.  Report received from day shift nurse and orders reviewed. Plan of Care reviewed and updated, discussed with patient.  Patient requesting different dinner tray. Reinforced diet orders.  Assisted with incontinent care. Resting comfortably in bed. Carmelina NounJennifer Berry Godsey, RN

## 2015-10-22 LAB — BASIC METABOLIC PANEL
CALCIUM: 8.1 mg/dL — AB (ref 8.6–10.5)
CARBON DIOXIDE TOTAL: 23 meq/L — AB (ref 24–32)
CHLORIDE: 109 meq/L (ref 95–110)
CREATININE BLOOD: 3.71 mg/dL — AB (ref 0.44–1.27)
E-GFR, AFRICAN AMERICAN: 16 SEE NOTE — AB (ref >60)
E-GFR, NON-AFRICAN AMERICAN: 14 — AB (ref >60)
GLUCOSE: 115 mg/dL — AB (ref 70–99)
POTASSIUM: 3.7 meq/L (ref 3.3–5.0)
SODIUM: 142 meq/L (ref 135–145)
UREA NITROGEN, BLOOD (BUN): 39 mg/dL — AB (ref 8–22)

## 2015-10-22 LAB — CBC WITH DIFFERENTIAL
BASOPHILS % AUTO: 0.8 %
BASOPHILS ABS AUTO: 0 10*3/uL (ref 0–0.2)
EOSINOPHIL % AUTO: 4.2 %
EOSINOPHIL ABS AUTO: 0.2 10*3/uL (ref 0–0.5)
HEMATOCRIT: 27.4 % — AB (ref 34–46)
HEMOGLOBIN: 8.5 g/dL — AB (ref 12.0–16.0)
LYMPHOCYTE ABS AUTO: 1.1 10*3/uL (ref 1.0–4.8)
LYMPHOCYTES % AUTO: 28.3 %
MCH: 21.7 pg — AB (ref 27–33)
MCHC: 30.8 % — AB (ref 32–36)
MCV: 70.4 UM3 — AB (ref 80–100)
MONOCYTES % AUTO: 10.8 %
MONOCYTES ABS AUTO: 0.4 10*3/uL (ref 0.1–0.8)
MPV: 7.1 UM3 (ref 6.8–10.0)
NEUTROPHIL ABS AUTO: 2.3 10*3/uL (ref 1.80–7.70)
NEUTROPHILS % AUTO: 55.9 %
PLATELET COUNT: 265 10*3/uL (ref 130–400)
RDW: 18.6 U — AB (ref 0–14.7)
RED CELL COUNT: 3.9 10*6/uL (ref 3.7–5.5)
WHITE BLOOD CELL COUNT: 4.1 10*3/uL — AB (ref 4.5–11.0)

## 2015-10-22 LAB — PHOSPHORUS (PO4): PHOSPHORUS (PO4): 4.5 mg/dL (ref 2.4–5.0)

## 2015-10-22 LAB — CULTURE SURVEILLANCE, MRSA

## 2015-10-22 LAB — RESPIRATORY VIRAL PANEL
ADENOVIRUS: NEGATIVE
CHLAMYDOPHILA PNEUMONIAE: NEGATIVE
CORONAVIRUS: NEGATIVE
HUMAN BOCAVIRUS: NEGATIVE
HUMAN METAPNEUMOVIRUS: NEGATIVE
INFLUENZA A: NEGATIVE
INFLUENZA B: NEGATIVE
MYCOPLASMA PNEUMONIAE: NEGATIVE
PARAINFLUENZA: NEGATIVE
RHINO/ENTEROVIR: POSITIVE — AB
RSV: NEGATIVE

## 2015-10-22 LAB — MAGNESIUM (MG): MAGNESIUM (MG): 2 mg/dL (ref 1.5–2.6)

## 2015-10-22 LAB — INR: INR: 2.61 — AB (ref 0.87–1.18)

## 2015-10-22 MED ORDER — FUROSEMIDE 40 MG TABLET
40.0000 mg | ORAL_TABLET | Freq: Every day | ORAL | Status: DC
Start: 2015-10-23 — End: 2015-10-25
  Administered 2015-10-23 – 2015-10-25 (×3): 40 mg via ORAL
  Filled 2015-10-22 (×3): qty 1

## 2015-10-22 NOTE — Progress Notes (Addendum)
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 Patient's case was cancelled due to supratherapeutic INR. Patient currently receiving maximal IOP-lowering drops and Coumadin is on hold.    Subjective/Interval Events:  No new changes since last visit. Eyes are comfortable and without pain.     Ophthalmology Exam:  Visual Acuity:  OD: HM   OS: 20/40   Intraocular pressure: OD: 25 mm Hg by tonopen  OS: 13 mm Hg by tonopen   Pupils:    OD: 2.5, minimally reactive  OS: 2.5, minimally reactive             Portable Slit Lamp Exam:      Exterior:  OD: Normal   OS: Normal   Lids/Lashes:  OD: Normal   OS: Normal    Conj/Sclera:  OD: White, quiet   OS: White, quiet    Cornea:  OD: Clear  OS: Clear   Anterior Chamber:  OD: Formed   OS: Formed; evaluation for cell limited by portable slit lamp exam   Iris:  OD: Round, minimally reactive  OS: Round, minimally reactive   Lens:  OD: PCIOL  OS: PCIOL         Impression:  1. Proliferative diabetic retinopathy both eyes  - s/p PPV OD and PRP both eyes  2. Vitreous hemorrhage, OD  - 2/2 #1  3. Glaucoma, both eyes  - DDx for recent increase in IOP includes ghost cell glaucoma vs. Medication noncompliance    Plan:  - Continue to monitor daily INR  - Continue cosopt both eyes BID, latanoprost both eyes QHS, brimonidine both eyes TID  - Plan for vitrectomy OD on Thursday 5/25 with Dr. Willaim BanePark if INR under 2.5  - Please page ophtho on-call [phone] with any questions or concerns    Patient was seen independently and discussed with Dr. Modesto CharonWong. Please see attending's note for final assessment and plan.    Sudie GrumblingMichael Yen, MD  PGY-2   [place] [facility] Dept of Ophthalmology  Service Pager: [phone]  Personal Pager: [phone]      I did not see the  patient but discussed the exam findings and management plan with the resident. I reviewed the resident's note, including the exam, assessment and plan. I agree with the plan as outlined above.    Quin HoopSophia Minyon Billiter, MD  Fellow, Vitreoretinal Surgery  Department of Ophthalmology and Vision Science

## 2015-10-22 NOTE — Nurse Assessment (Signed)
Bedside report received from night shift nurse. Droplet precautions are in place. Pt resting in bed with no distress noted, respirations even and unlabored. Pt is receiving 1L of oxygen via N/C. Plan of care reviewed with pt and appropriate. Pt goal is to manage her pain. Pt is emotional stating she is upset that someone came in to assess her eye this morning and they did not introduce themselves. Per report pt has been emotional due to recent loss of her daughter. Pt reports having an 8/10 pain level to her left flank and wants something stronger than what she is currently being offered, will inform MD. All questions answered. Pt verbalizes understanding. Orders and labs reviewed. Assuming pt care at this time.

## 2015-10-22 NOTE — Plan of Care (Signed)
Problem: Patient Care Overview (Adult)  Goal: Plan of Care Review  Outcome: Ongoing (interventions implemented as appropriate)  Goal Outcome Evaluation Note     Leslie Hernandez is a [age]yr female admitted 10/20/2015      OUTCOME SUMMARY AND PLAN MOVING FORWARD:   Sleeping intermittently throughout night.  Complained of discomfort to perineal area due to loose stool.  Barrier wipes used for peri care.  Large, loose BM X2, scheduled stool softeners held.  Bladder scan done q6h.  Patient with >999cc residuals.  Straight cath done @ 2330, 1500cc obtained with catheter.  Repositioned q2h.  Call light placed within patient's reach.  Reinforced diet and 1500ml restriction orders.  Sleeping intermittently throughout night.  No falls or injury.       Goal: Individualization and Mutuality  Outcome: Ongoing (interventions implemented as appropriate)  Goal: Discharge Needs Assessment  Outcome: Ongoing (interventions implemented as appropriate)    Problem: Sleep Pattern Disturbance (Adult)  Goal: Adequate Sleep/Rest  Patient will demonstrate the desired outcomes by discharge/transition of care.   Outcome: Ongoing (interventions implemented as appropriate)    Problem: Urine Elimination, Impaired (Adult)  Goal: Identify Related Risk Factors and Signs and Symptoms  Related risk factors and signs and symptoms are identified upon initiation of Human Response Clinical Practice Guideline (CPG)   Outcome: Ongoing (interventions implemented as appropriate)  Goal: Effective Urinary Elimination  Patient will demonstrate the desired outcomes by discharge/transition of care.   Outcome: Ongoing (interventions implemented as appropriate)  Goal: Effective Containment of Urine  Patient will demonstrate the desired outcomes by discharge/transition of care.   Outcome: Ongoing (interventions implemented as appropriate)  Goal: Reduced Incontinence Episodes  Patient will demonstrate the desired outcomes by discharge/transition of care.   Outcome: Ongoing  (interventions implemented as appropriate)    Problem: Fall Risk (Adult)  Goal: Identify Related Risk Factors and Signs and Symptoms  Related risk factors and signs and symptoms are identified upon initiation of Human Response Clinical Practice Guideline (CPG)   Outcome: Ongoing (interventions implemented as appropriate)  Goal: Absence of Falls  Patient will demonstrate the desired outcomes by discharge/transition of care.   Outcome: Ongoing (interventions implemented as appropriate)    Problem: Mobility, Physical Impaired (Adult)  Goal: Identify Related Risk Factors and Signs and Symptoms  Related risk factors and signs and symptoms are identified upon initiation of Human Response Clinical Practice Guideline (CPG)   Outcome: Ongoing (interventions implemented as appropriate)  Goal: Enhanced Mobility Skills  Patient will demonstrate the desired outcomes by discharge/transition of care.   Outcome: Ongoing (interventions implemented as appropriate)  Goal: Enhanced Functionality Ability  Patient will demonstrate the desired outcomes by discharge/transition of care.   Outcome: Ongoing (interventions implemented as appropriate)    Problem: Fluid Volume Excess (Adult,Obstetrics,Pediatric)  Goal: Identify Related Risk Factors and Signs and Symptoms  Related risk factors and signs and symptoms are identified upon initiation of Human Response Clinical Practice Guideline (CPG)   Outcome: Ongoing (interventions implemented as appropriate)  Goal: Stable Weight  Patient will demonstrate the desired outcomes by discharge/transition of care.   Outcome: Ongoing (interventions implemented as appropriate)  Goal: Balanced Intake/Output  Patient will demonstrate the desired outcomes by discharge/transition of care.   Outcome: Ongoing (interventions implemented as appropriate)    Problem: Pain, Acute (Adult)  Goal: Acceptable Pain Control/Comfort Level  Patient will demonstrate the desired outcomes by discharge/transition of care.    Outcome: Ongoing (interventions implemented as appropriate)    Problem: Pressure Ulcer Risk (Braden Scale) (Adult,Obstetrics,Pediatric)  Goal: Identify Related Risk Factors and Signs and Symptoms  Related risk factors and signs and symptoms are identified upon initiation of Human Response Clinical Practice Guideline (CPG)   Outcome: Ongoing (interventions implemented as appropriate)  Goal: Skin Integrity  Patient will demonstrate the desired outcomes by discharge/transition of care.   Outcome: Ongoing (interventions implemented as appropriate)
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
## 2015-10-22 NOTE — Nurse Assessment (Signed)
ASSESSMENT NOTE    Note Started: 10/22/2015, 19:56     Initial assessment completed and recorded in EMR.  Report received from day shift nurse and orders reviewed. Plan of Care reviewed and appropriate, discussed with patient. Patient's goals for this shift are to sleep well and to be able to get to the [facility] by herself. Will keep patient safe and comfortable. Will continue to monitor. Marshell GarfinkelLoradyl Kaulin Chaves,  RN.

## 2015-10-22 NOTE — Allied Health Consult (Signed)
[facility] -- ACUTE CARE SERVICE      OCCUPATIONAL THERAPY EVALUATION    Name: Leslie Hernandez  MRN: [id number]   Date of Service: 10/22/2015   Time In: 136  Total Time: 30 Minutes                                                                                                                                   INTAKE INFORMATION AND HISTORY:                                 Therapy Consult(s) Ordered: Physical Therapy and Occupational Therapy       Primary Service: (A) Hospital Medicine Faculty Service       Date of Admission: 10/20/2015        Date of Onset (Medicare only): 10/20/2015        Diagnosis: Shortness of breath       Precautions: Fall precautions and NWB LLE    ISOLATION [[id number]][[id number]] CONTINUOUS Discontinue   Comments: Rule out respiratory viruses   Question: Suspected Infection Answer: RSV          Language: English        Hx of Present Illness/Injury (pertinent test results & procedures): (per MD note)  50 year old woman with history of DM2, CKD4, PE on warfarin, dCHF, HTN, presenting with dyspnea.  She was in her usual state of health until about 1.5 weeks ago when she developed right eye spots/flutters that progressed to complete blindness.  About a week ago she developed a productive cough, nausea/vomiting, sweats, chills, rhinorrhea, and fatigue.  She had episodes of PND which would be relieved with supplemental O2 and associated with hypertension (200s/100s).  She also had decreased UOP, BLE edema, as well as constipation with her last BM 2 days prior to admission that required an enema.  She denies fevers, sick contacts, chest pain, abdominal pain, dysuria.     Past Medical/Surgical History:        Past Medical History:  No date: DM (diabetes mellitus)       Past Surgical History:  No date: PR CESAREAN DELIVERY ONLY      Comment: C-section, low cervical    Social History:           Social History   Substance Use Topics    Smoking status: Never Smoker    Smokeless tobacco: None    Alcohol use No        reports that she has never smoked. She does not have any smokeless tobacco history on file. She reports that she does not drink alcohol or use illicit drugs.    SUBJECTIVE EXAM:       Current Living Situation: SNF       Support at Time of Discharge: SNF  Staff        Environment at Discharge: Skilled  nursing or other medical facility       Environmental Barriers:  No entry steps       Assistive Devices Owned:   Personal manual wheelchair and other dme at facility        Adaptive Equipment Owned: N/A       Prior Level of Function: Ambulating independently pushing Manual wheeled chair  And occasionally used front wheeled walker   Needed assistance for lower body dressing  Needed assistance for transfers into/out of shower  Needed assistance for bathing  Needed assistance for meal preparation  Needed assistance for housekeeping tasks       Mental Status: Alert and oriented x4       Pain Level / Chief Complaint: 4 /10  Left ankle pain    OBJECTIVE EXAMINATION:       Observation: middle aged obese female patient , left ankle pain, coughing, stand pivot to bed side commode          Hand Dominance: Right    Extremity Status: WFL Except      ROM MMT Sensation Tone Coordination   RUE          LUE          RLE   Limited rach        LLE   Limited by pain and nwb precaution        Other:  Stand pivot to bed side commode. , verbal cues for compliance     Activities of Daily Living:    Key: Dep=Dependent, Max=Maximal, Mod=Moderate,  Min=Minimal, CG=Contact Guard, SB=Stand-By, S=Supervised, I=Independent, NA=Not Applicable, NT=Not Tested, N=Normal, G=Good, F=Fair, P=Poor, U=Unable, FWW=Front-Wheeled Walker     AC=Axillary Crutches, SPC=Single-Point Cane     Level of Assistance: Dep Max Mod Min SBA S MI I NA NT Comments   Bed Mobility (roll/self-adjust)       x    Bed features    Sidelying to Sit / Supine to Sit       x    Bed features    Trnsfer Bed to Chair     x      Stand pivot      Trsnfer Bed to Toilet    x       Stand pivot  with hand hold assistance   Trnsfer to tub / Shwr          x    Grooming/Hygiene       x       Toileting     x      Bed side commode    Bathing Upper     x      Set up for sponge bath    Bathing Lower     x         Upper Body Dress     x         Lower Body Dress  x         Edge of bed d/t limited reach    Self-feeding       x    Set up      Comment: patient transferred self to edge of bed and stand pivot to bed side commode on right LE. Patient  Required verbal cues for mWb compliance     Activities of Daily Living: BARTHEL ADL INDEX     FEEDING: 10/10   0 = unable  5 = needs help cutting, spreading butter, etc., or requires modified diet  10 =  independent    BATHING: 0/5  0 = dependent  5 = independent (or in shower)    GROOMING: 5/5  0 = needs to help with personal care  5 = independent face/hair/teeth/shaving (implements provided)    DRESSING: 5/10  0 = dependent  5 = needs help but can do about half unaided  10 = independent (including buttons, zips, laces, etc.)    BOWELS: 10/10  0 = incontinent (or needs to be given enemas)  5 = occasional accident  10 = continent    BLADDER: 10/10  0 = incontinent, or catheterized and unable to manage alone  5 = occasional accident  10 = continent    TOILET USE: 5/10  0 = dependent  5 = needs some help, but can do something alone  10 = independent (on and off, dressing, wiping)    TRANSFERS (BED TO CHAIR AND BACK): 5/15  0 = unable, no sitting balance  5 = major help (one or two people, physical), can sit  10 = minor help (verbal or physical)  15 = independent    MOBILITY (ON LEVEL SURFACES): 5/15  0 = immobile or < 50 yards  5 = wheelchair independent, including corners, >50 yards  10 = walks with help of one person (verbal or physical) > 50 yards  15 = independent (but may use any aid; for example, stick) > 50 yards     STAIRS: 0/10  0 = unable  5 = needs help (verbal, physical, carrying aid)  10 = independent    SCORE:    Patient scored 55/100, 45%  impairment.      Cognitive / Visual Perceptual:         Intact Impaired NT Comments   Orientation x      Memory  x      Attention  x      Following Directions  x      Communication x      Problem Solving  x  Non compliance with nwb    Safety/Judgment  x  Insight into Deficits   Praxis  x  deomotor   Organization/Sequence x      Spatial-Relations x      Neurological Attention x      Visuomotor / Visual Fields  x  Decrease in right eye, left can see but with decreased acuity       ASSESSMENT / RECOMMENDATIONS / EDUCATION:           Occupational Therapy Assessment / Discharge Recommendations: OT eval completed. Patient is a 50yr old female with referring diagnosis of SOB and incidental finding of left ankle fracture, who presents with functional decline d/t increased pain, deconditioning/generalized weakness, decreased ROM,  visual-perceptual deficits, and decreased condition knowledge impacting this patient's functional independence with ADLs/IADLs, and mobility/transfers vs prior level of function.    Patient will benefit from continuous OT services to address deficit areas as indicated above, prevent complications and safely restore patient  to or near prior level of function with necessary strategies and adaptations.     Discharge Recommendations:   Patient presents with a decline in functional status compared to her prior level of function for self-care.  Recommend patient be discharged to a setting in which she will have 24 hour assistance available, as well as ongoing occupational therapy at least 5 days per week, in order that she may eventually return to her home safely.          Anticipated  DME Needs: TBA  through rehab course (needs may change through course of medical recovery)          Patient / Caregiver Education Today: OT plan of care and role of OT              Method of Teaching: demonstration and verbal             Learner: patient            Response: verbalizes understanding and able to give  return demonstration                                                         GOALS / TREATMENT PLAN / [place] PROGNOSIS:        Patient's Goals: return to a place when        Occupational Therapy Goals: Prior to Discharge:      Hygiene/Grooming: Independent at Manual wheeled chair level   Dressing: Upper Body  Independent seated   Dressing:  Lower Body  Mod I with long handled adaptive device prn   Functional transfer to toilet Mod I with good compliance with mWb precaution   Functional transfer to shower. Mod I / Supervision   Functional transfer to w/c: Independent              Prognosis: Good for stated goals        Treatment Plan:        Bed mobility Training, Transfer Training, Sitting Balance, Functional Mobility, ADL / Self Care, Therapeutic Exercise, Adaptive Equipment, Durable Medical Equipment, Wheelchair Mobility, Printmaker / Work Simplification, Patient / Sales promotion account executive and Home Exercise Program         Recommended Frequency of Treatment: 1-2 times per day         Recommended Duration of Treatment: 14 days and reassess     Patient / Caregiver Participation / Education  Has the plan of care been explained to the patient / caregiver? yes  Is the patient able to understand the plan of care?                  yes   Does the patient / caregiver(s) agree with the plan of care?      yes  List barriers that may interfere with plan of care        Pain  Comments:              Interim Report Due:  11/05/2015        Updated Plan of Care Re-Certification  Due:  01/20/2016     Patient seen for additional occupational therapy treatment; see 10/22/2015 occupational therapy progress note for details.    x   No additional treatment rendered this encounter.       Reported by:   Arma Heading, OTR/L II,    Dept. of Physical Medicine & Rehabilitation (Acute Care Services)   Occupational Therapies  PI #: [id number]  Vocera: [id number]  Phone: [phone]

## 2015-10-22 NOTE — Progress Notes (Signed)
INTERNAL MEDICINE DAILY PROGRESS NOTE  Date: 10/22/2015  Time:  17:27      INTERVAL HISTORY:  No overnight events.    SUBJECTIVE:  Patient hungry - npo for ?OR with ophtho today.  Ongoing mild abdom pain.  Mult BM since initiated bowel regimen for constipation.  Patient now using bed pan (but I/O avail if ongoing psych issue with bedpan)    MEDICATIONS:  Scheduled Medications  Amlodipine (NORVASC) Tablet 10 mg, ORAL, QAM  Atorvastatin (LIPITOR) Tablet 80 mg, ORAL, Daily Bedtime  Brimonidine (ALPHAGAN) 0.2 % Ophthalmic Solution 1 drop, BOTH Eyes, TID  Docusate (COLACE) Capsule 100 mg, ORAL, BID  Dorzolamide 2%/Timolol 0.5% (COSOPT) Ophthalmic Solution 1 drop, BOTH Eyes, BID  Gabapentin (NEURONTIN) Capsule 100 mg, ORAL, TID  HydrALAZINE (APRESOLINE) Tablet 100 mg, ORAL, TID  Insulin Aspart (NOVOLOG) Injection Pen 1-4 Units, SUBCUTANEOUS, Daily Bedtime  Insulin Aspart (NOVOLOG) Injection Pen 1-6 Units, SUBCUTANEOUS, TID w/ meals - correction dose  Latanoprost (XALATAN) 0.005 % Ophthalmic Solution 1 drop, BOTH Eyes, Daily Bedtime  Polyethylene Glycol 3350 (MIRALAX) Oral Powder Packet 17 g, ORAL, QAM  Sennosides (SENOKOT) Tablet 17.2 mg, ORAL, Daily Bedtime  Warfarin Patient Flag, NOT APPLICABLE, PATIENT FLAG ONLY    IV Medications   PRN Medications  Acetaminophen (TYLENOL) Tablet 650 mg, ORAL, Q4H PRN  Bisacodyl (DULCOLAX) Suppository 10 mg, RECTALLY, Q24H PRN  Dextrose 50% Injection 12.5 g, IV, PRN  Dextrose 50% Injection 25 g, IV, PRN  Glucagon Injection 1 mg, IM, PRN  HydrALAZINE (APRESOLINE) Injection 10 mg, IV, Q4H PRN  Lactulose (CHRONULAC) 10 gram/15 mL Solution 30 mL, ORAL, Q6H PRN  Mineral Oil (FLEET) Enema 1 enema, RECTALLY, Bedtime PRN  Ondansetron (ZOFRAN) Injection 4 mg, IV, Q8H PRN        OBJECTIVE:   Vital Signs  Summary  Temp Min: 36.8 C (98.2 F) Max: 37.4 C (99.4 F)  BP: (131-182)/(51-99)   Pulse Min: 81 Max: 96  Resp Min: 16 Max: 20  SpO2 Min: 96 % Max: 100 %      Current Vitals  Temp: 36.8 C  (98.2 F)  BP: 158/80  Pulse: 86  Resp: 20  SpO2: 99 %      Weight: 114.4 kg (252 lb 3.3 oz)     Intake and Output  Last Two Completed Shifts  In: 480 [Oral:480]  Out: 3600 [Urine:3600]    Current Shift  In: -   Out: 450 [Urine:450]      Physical Exam  General: obese female, No acute distress, conversational. A+Ox3  HEENT:  Moist oral mucosa.  Right sclera injected and globe mild protrusion  CV: Regular rate and rhythm, nl s1s2, no murmurs, rubs or gallops. No JVD.  Pulm: CTAB  good symmetric air movement  Abd: Soft NT ND, +BS. No rebound or guarding  Skin: No rashes. No jaundice.  Neuro: face symmetric, moving all 4 extremities spontaneously  Extrem:  trace LE edema.      LINES AND DRAINS:   piv    LAB TESTS/STUDIES:     I personally reviewed the following report(s)  .    Lab Results - 24 hours (excluding micro and POC)   CBC WITH DIFFERENTIAL     Status: Abnormal   Result Value Status    WHITE BLOOD CELL COUNT 4.1 (L) Final    RED CELL COUNT 3.90 Final    HEMOGLOBIN 8.5 (L) Final    HEMATOCRIT 27.4 (L) Final    MCV 70.4 (L) Final  MCH 21.7 (L) Final    MCHC 30.8 (L) Final    RDW 18.6 (H) Final    MPV 7.1 Final    PLATELET COUNT 265 Final    NEUTROPHILS % AUTO 55.9 Final    LYMPHOCYTES % AUTO 28.3 Final    MONOCYTES % AUTO 10.8 Final    EOSINOPHIL % AUTO 4.2 Final    BASOPHILS % AUTO 0.8 Final    NEUTROPHIL ABS AUTO 2.30 Final    LYMPHOCYTE ABS AUTO 1.1 Final    MONOCYTES ABS AUTO 0.4 Final    EOSINOPHIL ABS AUTO 0.2 Final    BASOPHILS ABS AUTO 0 Final   BASIC METABOLIC PANEL     Status: Abnormal   Result Value Status    SODIUM 142 Final    POTASSIUM 3.7 Final    CHLORIDE 109 Final    CARBON DIOXIDE TOTAL 23 (L) Final    UREA NITROGEN, BLOOD (BUN) 39 (H) Final    CREATININE BLOOD 3.71 (H) Final    E-GFR, AFRICAN AMERICAN 16 (L) Final    E-GFR, NON-AFRICAN AMERICAN 14 (L) Final    GLUCOSE 115 (H) Final    CALCIUM 8.1 (L) Final   MAGNESIUM (MG)     Status: None   Result Value Status    MAGNESIUM (MG) 2.0  Final   PHOSPHORUS (PO4)     Status: None   Result Value Status    PHOSPHORUS (PO4) 4.5 Final   INR     Status: Abnormal   Result Value Status    INR 2.61 (H) Final     INFLUENZA A Negative  Negative  Final     INFLUENZA B Negative  Negative  Final    RESPIRATORY SYNCYTIAL VIRUS Negative  Negative  Final    PARAINFLUENZA Negative  Negative  Final    CORONAVIRUS Negative  Negative  Final    HUMAN METAPNEUMOVIRUS Negative  Negative  Final    RHINOVIRUS/ENTEROVIRUS POSITIVE (Abnl) Negative  Final    ADENOVIRUS Negative  Negative  Final    HUMAN BOCAVIRUS Negative  Negative  Final    CHLAMYDOPHILA PNEUMONIAE Negative  Negative  Final    MYCOPLASMA PNEUMONIAE Negative  Negative  Final    Comment:         NEW IMAGES:  TTE:  SUMMARY:  1. The LV systolic function is normal. The estimated LV ejection fraction is 65 %.  2. Trivial pericardial effusion.  3. Mild concentric left ventricular hypertrophy.  4. The left ventricular diastolic function is abnormal. Stage I: Impaired early left ventricular   relaxation (Abnormal relaxation pattern).  5. The left atrium is normal in size and structure.  6. The right atrium is normal in size and structure.  7. Mildly elevated pulmonary artery systolic pressure.    ASSESSMENT & PLAN:    50 year old female past medical history of DM-2, CKD-4, PE on warfarin, dCHF, HTN with CHF exacerbation and subacute vitreal hemorrhage requiring ophtho retinal surgery however patient with elevated INR.    Acute on chronic heart failure, diastolic  Pericardial effusion  Patient with preserved EF. Presented in decompensated heart failure with volume overload requiring iv lasix.  Also with small pericardial effusion likely due to volume overload.  - Patient with Cr, but no longer appears volume overloaded and Cr rising.  Held diuresis today with NPO.  Re-initiate home oral medication tomorrow.  - Fluid restriction, daily weight    Cough  rhinorhea  Patient with rhinovirus.   Patient  given  vanc/cefepime in ED, but no evidence pneumonia and with viral URI.  Monitor off abx.  - supportive care    AKI on CKD  Patient with baseline Cr 2.3.  Presented with Cr 3.5 rose to 3.7 with diureisis.    - renally dose medications and avoid nephrotoxins  - Resume home po lasix as euvolemic/dry on exam    HTN  Hypertensive on admission, likely related to volume overload.  Monitor with diuresis.  - cont home amlodipine, hydralazine.  Have hydralazine iv PRN available.    DM2  Peripheral neuropathy  Retinopathy  A1c in march was 9.8  Patient with BG normal with sliding scale alone despite home long acting.  Monitor and discharge on appropriate regimen.  Potentially discontinue home insulin, but will follow as renal function improves.  - sliding scale  - follow up A1C  - Gabapentin reduced to 100mg  TID given renal function    PE (saddle embolus) status post IVC filter  supratherapeutic INR  [facility] with plan for lifelong AC given unprovoked and severity.  Feb V/Q without evidence of PE.  - Plan to initiate hep gtt once INR <2 in preparation for potential ophtho procedure  - will re-initiate warfarin once evaluated by ophtho and if no plans for procedure    Glaucoma  vitreal hemorrhage  diabetic retinopathy  Right eye blindness  Transferred to [place] for urgent retinal surgery for elevated IOP, [facility], due to have surgery today but held off due to elevated INR  -follow up with ophtho  -brimonidine, latanoprost, cosopt    Constipation - RESOLVED  Last BM 2 days prior admission and required enema, no evidence of obstruction, no prior abdominal surgeries, benign abd exam with bowel sounds.  Resolved with aggressive bowel regimen    Iron deficiency anemia  Hemoglobin at baseline of ~8, no evidence of blood loss, receives iron infusions (last dose 5/18), last iron studies in 08/2015 with ferritin 286 and Fe % saturation  15. Iron studies unrevealing  -monitor CBC    Left foot fracture  Recent Charcot's neuropathic osteoarthropathy left foot versus neuropathic fracture of the navicular bone and fifth metatarsal. Seen by ortho with worsening osteonecrosis on xray  -strict NWB  -CAM boot  -PT rec SNF  - Follow up OT    Vulvar ulcerated lesion  Seen by gynecology on 5/16, swab positive for HSV 2, no Rx found in outside records for acyclovir or other antiviral  -consider starting acyclovir or other antiviral if renal function stable  -follow up with gynecology    FEN: renal/diabetic diet  DVT Prophylaxis: warfarin --> hep gtt once INR <2  Code status: Full  Dispo: SNF when medically stable --> [facility]   Ophtho procedure, need improving Cr and stable INR prior to discharge.    Approximately 30 minutes was spent with the patient, the majority of which was spent reviewing the records, developing a treatment plan, calling consultant services as needed, and counseling the patient and family on the treatment plan, including risks and benefits of the treatment options.    Lennox SoldersEric Haldon Carley, MD  Internal Medicine  Hospitalist  PI: [phone]  Pager: [phone]

## 2015-10-23 LAB — CBC WITH DIFFERENTIAL
BASOPHILS % AUTO: 0.7 %
BASOPHILS ABS AUTO: 0 10*3/uL (ref 0–0.2)
EOSINOPHIL % AUTO: 5.8 %
EOSINOPHIL ABS AUTO: 0.2 10*3/uL (ref 0–0.5)
HEMATOCRIT: 28.4 % — AB (ref 34–46)
HEMOGLOBIN: 8.8 g/dL — AB (ref 12.0–16.0)
LYMPHOCYTE ABS AUTO: 1 10*3/uL (ref 1.0–4.8)
LYMPHOCYTES % AUTO: 23.9 %
MCH: 21.8 pg — AB (ref 27–33)
MCHC: 30.9 % — AB (ref 32–36)
MCV: 70.7 UM3 — AB (ref 80–100)
MONOCYTES % AUTO: 10.7 %
MONOCYTES ABS AUTO: 0.4 10*3/uL (ref 0.1–0.8)
MPV: 6.8 UM3 (ref 6.8–10.0)
NEUTROPHIL ABS AUTO: 2.4 10*3/uL (ref 1.80–7.70)
NEUTROPHILS % AUTO: 58.9 %
PLATELET COUNT: 284 10*3/uL (ref 130–400)
RDW: 18.7 U — AB (ref 0–14.7)
RED CELL COUNT: 4.02 10*6/uL (ref 3.7–5.5)
WHITE BLOOD CELL COUNT: 4 10*3/uL — AB (ref 4.5–11.0)

## 2015-10-23 LAB — BASIC METABOLIC PANEL
CALCIUM: 8.1 mg/dL — AB (ref 8.6–10.5)
CARBON DIOXIDE TOTAL: 21 meq/L — AB (ref 24–32)
CHLORIDE: 110 meq/L (ref 95–110)
CREATININE BLOOD: 3.7 mg/dL — AB (ref 0.44–1.27)
E-GFR, AFRICAN AMERICAN: 16 SEE NOTE — AB (ref >60)
E-GFR, NON-AFRICAN AMERICAN: 14 — AB (ref >60)
GLUCOSE: 159 mg/dL — AB (ref 70–99)
POTASSIUM: 4.1 meq/L (ref 3.3–5.0)
SODIUM: 142 meq/L (ref 135–145)
UREA NITROGEN, BLOOD (BUN): 38 mg/dL — AB (ref 8–22)

## 2015-10-23 LAB — PHOSPHORUS (PO4): PHOSPHORUS (PO4): 4.4 mg/dL (ref 2.4–5.0)

## 2015-10-23 LAB — ELECTROCARDIOGRAM WITH RHYTHM STRIP: QTC: 446

## 2015-10-23 LAB — INR: INR: 2.08 — AB (ref 0.87–1.18)

## 2015-10-23 LAB — MAGNESIUM (MG): MAGNESIUM (MG): 2 mg/dL (ref 1.5–2.6)

## 2015-10-23 NOTE — Allied Health Progress (Signed)
Physical Therapy Progress Note    Date of Service: 10/23/2015   Time in: 1048  Total time: 0 Minutes    Nurse cleared patient for PT evaluation. Patient reporting she had [facility] She reports she is too tired to get out of bed. Asked to be seen at 2pm. Physical therapy will attempt to evaluate the patient at another time.    Bethanne GingerMaria Theresa Arnell Slivinski PT,  [place] #:[id number]#:[id number]  Vocera [phone]

## 2015-10-23 NOTE — Allied Health Consult (Addendum)
PM & R -- ACUTE CARE SERVICE      PHYSICAL THERAPY EVALUATION     Name: Leslie Hernandez   MRN: [id number]   Date of Service: 10/23/2015  Time In: 1525    Total Time: 25 Minutes                                                                                                                                                  INTAKE INFORMATION AND HISTORY:                       Therapy Consult(s) Ordered: Physical Therapy and Occupational Therapy  Primary Service: (A) Hospital Medicine Faculty Service   Date of Admission: 10/20/2015  Date of Onset (Medicare only):  10/20/2015  Diagnosis: Left foot fracture  Language: English    Precautions: NWB LLE    History of Present Illness/Injury (Including pertinent test results & procedures):  From H&P note: "50 year old woman with history of DM2, CKD4, PE on warfarin, dCHF, HTN, presenting with dyspnea.  She was in her usual state of health until about 1.5 weeks ago when she developed right eye spots/flutters that progressed to complete blindness.  About a week ago she developed a productive cough, nausea/vomiting, sweats, chills, rhinorrhea, and fatigue.  She had episodes of PND which would be relieved with supplemental O2 and associated with hypertension (200s/100s).  She also had decreased UOP, BLE edema, as well as constipation with her last BM 2 days prior to admission that required an enema.  She denies fevers, sick contacts, chest pain, abdominal pain, dysuria."      Past Medical/Surgical History:    Past Medical History:   Diagnosis Date    DM (diabetes mellitus)      Past Surgical History:   Procedure Laterality Date    PR CESAREAN DELIVERY ONLY      C-section, low cervical     Social History:    Social History     Social History    Marital status: SEPARATED     Spouse name: N/A    Number of children: N/A    Years of education: N/A     Occupational History    Not on file.     Social History Main Topics    Smoking status: Never Smoker    Smokeless tobacco: Not on file     Alcohol use No    Drug use: No    Sexual activity: Not on file     Other Topics Concern    Not on file     Social History Narrative       SUBJECTIVE EXAM:    Current Living Situation: Was at [facility] for two weeks, was renting a room in a home  Support at Time of Discharge:  none  Environment at  Discharge: will likely discharge to a SNF   Environmental Barriers:  none  Assistive Devices Owned: manual wheelchair  Adaptive Equipment Owned: N/A  Prior Level of Function: Non-Ambulatory since patient unable to weight bear on left LE.  Transferring with assist.  Mental Status: Alert and oriented x4    Pain Level / Chief Complaint: 8/10 pain located in left ankle      OBJECTIVE EXAMINATION:    Observation: Nurse cleared patient for PT evaluation. Returned in the afternoon. Transferred to [facility] to urinate. Has difficulty maintaining NWB of her left LE with sit to stand and with transfers using a FWW placing sometimes TDWB of her left ankle.      Extremity Status: WFL       ROM MMT Sensation Tone Coordination   RUE        LUE        RLE        LLE        Comments: ROM WFL, 3/5 gross strength of extremities, no reports of numbness, tone normal, and coordination intact grossly     Demonstrated Functional Activities:   Key: Dep=Dependent     Max=Maximal     Mod=Moderate     Min=Minimal     CG=Contact Guard     SB=Stand-By     S=Supervised     I=Independent     NA=Not Applicable     NT=Not Tested     N=Normal     G=Good     F=Fair     P=Poor     U=Unable     FWW=Front-Wheeled Walker     AC=Axillary Crutches     SPC=Single-Point Cane     Level of Assistance Dep Max Mod Min CG SB S I NA NT Comments   Rolling            X    Scooting        X        Sidelying/ Supine to Sit      X     With HOB elevated long sit to sitting at EOB with SBA   Transfer Sit to Stand    X       MIN A sit to stand using a FWW towards right to BSC/ cardiac chair   Transfer Bed to Chair    X       MIN A with a FWW   Gait            X     Up/Down Step/Stairs              Comments:       Balance:      N G F P U NA NT Comments   Sitting Balance - Static  X         Sitting Balance - Dynamic  X         Standing Balance - Static   X     With a FWW   Standing Balance - Dynamic       X    Comments:      ASSESSMENT / RECOMMENDATIONS/ EDUCATION:    Physical Therapy Assessment and Discharge Recommendations: Patient is a 50 year old female with history of left ankle fracture. From initial assessment, patient requires SBA with bed mobility with HOB elevated and MIN A with transfers using a FWW. Patient has difficulty maintaining NWB of her left LE with standing and transfers. Will benefit from short  term skilled PT to improve strength and endurance and improve functional mobility to meet goals below.     DME needs: has a manual wheelchair. May need a FWW when she discharges, but will determine close to discharge.    Physical Therapy Recommendations for Nursing: Out of bed to cardiac chair via stand transfer using a FWW with one person assist.  Needs cues to maintain NWB of her left LE. Phase: 3    Patient / Caregiver Education Today: Role of PT, NWB precaution of left LE    Method of Teaching: verbal     Learner: patient    Response: partially understands, needs more practice    GOALS / TREATMENT PLAN / FUNCTIONAL PROGNOSIS:  Patient's Goals: get out of bed to a chair    Physical Therapy Goals:    Bed Mobility: supervised with bed features  Transfers: sit to stand using a FWW with SBA  Transfers: SBA using a FWW  Transfers: squat pivot transfer with SBA  Patient education: maintain NWB of her left LE with mobility.  Wheelchair mobility: propel wheelchair using bilateral LEs x 100 feet with supervision    Prognosis:  Good to meet goals     Treatment Plan:    Bed Mobility Training  Futures traderBalance Training  Coordination Training  Transfer Training  Wheelchair Mobility  Therapeutic Exercise  Endurance Training  Patient Education  Journalist, newspaperCaregiver Education / Psychologist, occupationalTraining  Discharge  Planning  Durable Medical Equipment Recommendations     Recommended Frequency of Treatment: 3-5 days/week     Recommended Duration of Treatment: two weeks and reassess    Patient / Caregiver Participation / Education   Has the plan of care been explained to the patient / caregiver? yes   Is the patient able to understand the plan of care?  yes    Does the patient / caregiver(s) agree with the plan of care? yes    List Barriers that may interfere with plan of care:   None    Interim Report Due:  11/06/2015     Patient seen for additional physical therapy treatment; see 10/23/2015 physical therapy progress note for details.    X   No additional treatment rendered this encounter.     Reported by:  Bethanne GingerMaria Theresa Marabella Popiel PT,  PI [phone]  Vocera [phone]

## 2015-10-23 NOTE — Plan of Care (Addendum)
Problem: Patient Care Overview (Adult)  Goal: Plan of Care Review  Outcome: Ongoing (interventions implemented as appropriate)  Goal Outcome Evaluation Note     Leslie Hernandez is a [age]yr female admitted 10/20/2015      OUTCOME SUMMARY AND PLAN MOVING FORWARD:   [facility]. Able to void. Washed up and sat on the chair for almost an hour. BP is elevated with due Hydralazine given. Slept well. NPO post midnight for possible surgery  Today.      Goal: Individualization and Mutuality  Outcome: Ongoing (interventions implemented as appropriate)  Goal: Discharge Needs Assessment  Outcome: Ongoing (interventions implemented as appropriate)    Problem: Sleep Pattern Disturbance (Adult)  Goal: Adequate Sleep/Rest  Patient will demonstrate the desired outcomes by discharge/transition of care.   Outcome: Ongoing (interventions implemented as appropriate)    Problem: Urine Elimination, Impaired (Adult)  Goal: Effective Urinary Elimination  Patient will demonstrate the desired outcomes by discharge/transition of care.   Outcome: Ongoing (interventions implemented as appropriate)  Goal: Effective Containment of Urine  Patient will demonstrate the desired outcomes by discharge/transition of care.   Outcome: Ongoing (interventions implemented as appropriate)  Goal: Reduced Incontinence Episodes  Patient will demonstrate the desired outcomes by discharge/transition of care.   Outcome: Ongoing (interventions implemented as appropriate)    Problem: Fall Risk (Adult)  Goal: Absence of Falls  Patient will demonstrate the desired outcomes by discharge/transition of care.   Outcome: Ongoing (interventions implemented as appropriate)    Problem: Mobility, Physical Impaired (Adult)  Goal: Enhanced Mobility Skills  Patient will demonstrate the desired outcomes by discharge/transition of care.   Outcome: Ongoing (interventions implemented as appropriate)  Goal: Enhanced Functionality  Ability  Patient will demonstrate the desired outcomes by discharge/transition of care.   Outcome: Ongoing (interventions implemented as appropriate)    Problem: Fluid Volume Excess (Adult,Obstetrics,Pediatric)  Goal: Stable Weight  Patient will demonstrate the desired outcomes by discharge/transition of care.   Outcome: Ongoing (interventions implemented as appropriate)  Goal: Balanced Intake/Output  Patient will demonstrate the desired outcomes by discharge/transition of care.   Outcome: Ongoing (interventions implemented as appropriate)    Problem: Pain, Acute (Adult)  Goal: Acceptable Pain Control/Comfort Level  Patient will demonstrate the desired outcomes by discharge/transition of care.   Outcome: Ongoing (interventions implemented as appropriate)    Problem: Pressure Ulcer Risk (Braden Scale) (Adult,Obstetrics,Pediatric)  Goal: Skin Integrity  Patient will demonstrate the desired outcomes by discharge/transition of care.   Outcome: Ongoing (interventions implemented as appropriate)

## 2015-10-23 NOTE — Allied Health Procedure (Signed)
Mobility Program Data Extraction Note  (See Physical Therapy Notes for Clinical Information)        Mobility Phase Guidelines: [email or web address]    Last Documented Mobility Phase: No Data Exists    Current Phase: Phase 3    Mobility Session Initiated with Physical Therapy?: Yes    Mobility Session Completed with Physical Therapy?: Yes    Mobility Program Status: Initial Evaluation      ======================================================================       Phase I  Phase II    Command and physical response activation  Arousal/ orientation/ communication Degree of Interation: Low Cooperation    Command and verbal response activation     Patient and/or caregiver education    Arousal and orientation degree of Interaction: Comatose, Unarousable    Musculoskeletal Program: Advancement (AROM, AAROM, resistive training, metered exercise UE/LE    Musculoskeletal Program: Positioning Head to Feet: prevent subluxation, joint malalignment, manage tone, manage edema, visual-spatial orientation, PROM all limbs: proximal to distal, facilitate basic AROM  Participation in beginning components of bed mobility: Reaching, rolling, active LE      Sensorimotor Program: midline orientation, reflexes, tactile feedback  Positioning Head to Feet: prevent subluxation, joint malalignment, manage tone, manage edema, visual-spatial orientation    Caregiver education and participation as appropriate  Sensorimotor Program: visual attention, midline orientation, righting reactions    Dependent splint/orthotic application  Supported sitting EOB, cardiac chair, wheelchair    Dependent mobilization out of bed (to cardiac chair)  Mobilize out of bed: Passive Transfers Dependent through Max Assist  ?Lift Team indicated    Encourage patient participation with bed-level ADL's: Hygiene/grooming; self-feeding; upper body sponge bathing, upper body dressing  Mobilize  out of bed: Assess transfer type, level of assist, tolerance      Sitting schedule implemented all shifts      EOB: Supported, unsupported or challenged balance activities      Supported sitting exercise: metered exercise UE/LE      Pre-gait training (dependent to moderate assist)      Other mobilization: Tile Table Program      Passive Splint/Orthotic Application      Encourage patient participation with edge of bed ADL's: Hygiene/grooming; self-feeding; upper body sponge bathing/dressing; lower body sponge bathing    Phase III  Phase IV   X Arousal/orientation/communication Degree of Interaction: Moderate Cooperation  Degree of interaction: High Cooperation Education for patient/family    Patient and/or cargiver education as appropriate (incorporate any appropriate activity)  Advanced Musculoskeletal Program: Resistive, metered exercise UE/LE    Musculoskeletal Program: Advancement (AROM, AAROM, resistive training metered exercise UE/LE, Object Manipulation)  Advanced bed mobility/incorporate nursing all shifts     Sensorimotor Program: proprioception feeback, coordination reactions  Sensorimotor Program: timing, skilled voluntary control of limbs, position ; direction change reactions     Caregiver education and participation  Functional transfer training (commode or chair)/incorporate nursing all shifts   X Bed mobility advancement  High level balance activities    Sitting balance advancement: Static and dynamic trunk activities  Gait program or (wheelchair mobility for wheelchair-dependent only), incorporate nursing all shifts   X Advance out of bed sitting tolerance  Active splint/orthotic application   X Active assisted transfer training and advancement  Encourage patient participation in ADL's in bathroom setting: Use of regular toilet; ADL's at sink-side; consider use of shower stall    Standing Activities (Pre-gait): Supported/unsupported, Static/dynamic; tilt table      Gait Training/assisted gait       Active assistive splint/orthotic application        Encourage Patient participation in seated ADL Activities OOB in bedside chair/wheelchair/ cardiac chair/ bedside commode: Hygiene/grooming; self-feeding; upper body sponge bathing/ dressing; lower Body sponge bathing/ dressing; toileting       Phase D for Pediatric use    ======================================================================    Electronically signed by:     Enid Baas Jonell Krontz PT,  [place] [phone]  [place]

## 2015-10-23 NOTE — Progress Notes (Signed)
INTERNAL MEDICINE DAILY PROGRESS NOTE  Date: 10/23/2015  Time:  17:21      INTERVAL HISTORY:  No overnight events.    SUBJECTIVE:  Patient eager for OR, understands scheduled for Thursday with INR <2.5  Denies chest pain, shortness of breath.   Out of bed with minimal assistance.      MEDICATIONS:  Scheduled Medications  Amlodipine (NORVASC) Tablet 10 mg, ORAL, QAM  Atorvastatin (LIPITOR) Tablet 80 mg, ORAL, Daily Bedtime  Brimonidine (ALPHAGAN) 0.2 % Ophthalmic Solution 1 drop, BOTH Eyes, TID  Docusate (COLACE) Capsule 100 mg, ORAL, BID  Dorzolamide 2%/Timolol 0.5% (COSOPT) Ophthalmic Solution 1 drop, BOTH Eyes, BID  FUROsemide (LASIX) Tablet 40 mg, ORAL, QAM  Gabapentin (NEURONTIN) Capsule 100 mg, ORAL, TID  HydrALAZINE (APRESOLINE) Tablet 100 mg, ORAL, TID  Insulin Aspart (NOVOLOG) Injection Pen 1-4 Units, SUBCUTANEOUS, Daily Bedtime  Insulin Aspart (NOVOLOG) Injection Pen 1-6 Units, SUBCUTANEOUS, TID w/ meals - correction dose  Latanoprost (XALATAN) 0.005 % Ophthalmic Solution 1 drop, BOTH Eyes, Daily Bedtime  Polyethylene Glycol 3350 (MIRALAX) Oral Powder Packet 17 g, ORAL, QAM  Sennosides (SENOKOT) Tablet 17.2 mg, ORAL, Daily Bedtime  Warfarin Patient Flag, NOT APPLICABLE, PATIENT FLAG ONLY    IV Medications   PRN Medications  Acetaminophen (TYLENOL) Tablet 650 mg, ORAL, Q4H PRN  Bisacodyl (DULCOLAX) Suppository 10 mg, RECTALLY, Q24H PRN  Dextrose 50% Injection 12.5 g, IV, PRN  Dextrose 50% Injection 25 g, IV, PRN  Glucagon Injection 1 mg, IM, PRN  HydrALAZINE (APRESOLINE) Injection 10 mg, IV, Q4H PRN  Lactulose (CHRONULAC) 10 gram/15 mL Solution 30 mL, ORAL, Q6H PRN  Mineral Oil (FLEET) Enema 1 enema, RECTALLY, Bedtime PRN  Ondansetron (ZOFRAN) Injection 4 mg, IV, Q8H PRN        OBJECTIVE:   Vital Signs  Summary  Temp Min: 36.8 C (98.2 F) Max: 37.2 C (98.9 F)  BP: (146-182)/(65-90)   Pulse Min: 88 Max: 90  Resp Min: 16 Max: 18  SpO2 Min: 96 % Max: 100 %      Current Vitals  Temp: 37 C (98.6 F)  BP:  146/82  Pulse: 88  Resp: 18  SpO2: 97 %      Weight: 112.2 kg (247 lb 5.7 oz)     Intake and Output  Last Two Completed Shifts  In: 180 [Oral:180]  Out: 950 [Urine:950]    Current Shift  In: 300 [Oral:300]  Out: -       Physical Exam  General: obese female, No acute distress, conversational. A+Ox3  HEENT:  Moist oral mucosa.  Right sclera injected and globe mild protrusion  CV: Regular rate and rhythm, nl s1s2, no murmurs, rubs or gallops. No JVD.  Pulm: CTAB  good symmetric air movement  Abd: Soft NT ND, +BS. No rebound or guarding  Skin: No rashes. No jaundice.  Neuro: face symmetric, moving all 4 extremities spontaneously  Extrem:  trace LE edema.      LINES AND DRAINS:   piv    LAB TESTS/STUDIES:     I personally reviewed the following report(s)  .    Lab Results - 24 hours (excluding micro and POC)   CBC WITH DIFFERENTIAL     Status: Abnormal   Result Value Status    WHITE BLOOD CELL COUNT 4.0 (L) Final    RED CELL COUNT 4.02 Final    HEMOGLOBIN 8.8 (L) Final    HEMATOCRIT 28.4 (L) Final    MCV 70.7 (L) Final    MCH  21.8 (L) Final    MCHC 30.9 (L) Final    RDW 18.7 (H) Final    MPV 6.8 Final    PLATELET COUNT 284 Final    NEUTROPHILS % AUTO 58.9 Final    LYMPHOCYTES % AUTO 23.9 Final    MONOCYTES % AUTO 10.7 Final    EOSINOPHIL % AUTO 5.8 Final    BASOPHILS % AUTO 0.7 Final    NEUTROPHIL ABS AUTO 2.40 Final    LYMPHOCYTE ABS AUTO 1.0 Final    MONOCYTES ABS AUTO 0.4 Final    EOSINOPHIL ABS AUTO 0.2 Final    BASOPHILS ABS AUTO 0 Final   BASIC METABOLIC PANEL     Status: Abnormal   Result Value Status    SODIUM 142 Final    POTASSIUM 4.1 Final    CHLORIDE 110 Final    CARBON DIOXIDE TOTAL 21 (L) Final    UREA NITROGEN, BLOOD (BUN) 38 (H) Final    CREATININE BLOOD 3.70 (H) Final    E-GFR, AFRICAN AMERICAN 16 (L) Final    E-GFR, NON-AFRICAN AMERICAN 14 (L) Final    GLUCOSE 159 (H) Final    CALCIUM 8.1 (L) Final   MAGNESIUM (MG)     Status: None   Result Value Status    MAGNESIUM (MG) 2.0 Final   PHOSPHORUS  (PO4)     Status: None   Result Value Status    PHOSPHORUS (PO4) 4.4 Final   INR     Status: Abnormal   Result Value Status    INR 2.08 (H) Final     INFLUENZA A Negative  Negative  Final     INFLUENZA B Negative  Negative  Final    RESPIRATORY SYNCYTIAL VIRUS Negative  Negative  Final    PARAINFLUENZA Negative  Negative  Final    CORONAVIRUS Negative  Negative  Final    HUMAN METAPNEUMOVIRUS Negative  Negative  Final    RHINOVIRUS/ENTEROVIRUS POSITIVE (Abnl) Negative  Final    ADENOVIRUS Negative  Negative  Final    HUMAN BOCAVIRUS Negative  Negative  Final    CHLAMYDOPHILA PNEUMONIAE Negative  Negative  Final    MYCOPLASMA PNEUMONIAE Negative  Negative  Final    Comment:         NEW IMAGES:  None    ASSESSMENT & PLAN:    50 year old female past medical history of DM-2, CKD-4, PE on warfarin, dCHF, HTN with CHF exacerbation and subacute vitreal hemorrhage requiring ophtho retinal surgery however patient admitted with elevated INR.    Acute on chronic heart failure, diastolic  Pericardial effusion  Patient with preserved EF. Presented in decompensated heart failure with volume overload requiring iv lasix.  Also with small pericardial effusion likely due to volume overload.  - Patient with Cr, but no longer appears volume overloaded and Cr rising.  Re-initiated home oral medication  - home lasix 40mg  po daily, monitor renal function  - Fluid restriction, daily weight    Cough  rhinorhea  Patient with rhinovirus.   Patient given vanc/cefepime in ED, but no evidence pneumonia and with viral URI.  Monitor off abx.  - supportive care    AKI on CKD  Patient with baseline Cr 2.3.  Presented with Cr 3.5 rose to 3.7 with diureisis.    - renally dose medications and avoid nephrotoxins  - Resume home po lasix as euvolemic/dry on exam    HTN  Hypertensive on admission, likely related to volume overload.  Monitor with  diuresis.  - cont home amlodipine, hydralazine.  Have hydralazine iv PRN  available.    DM2  Peripheral neuropathy  Retinopathy  A1c in march was 9.8  Patient with BG normal with sliding scale alone despite home long acting.  Monitor and discharge on appropriate regimen.  Potentially discontinue home insulin, but will follow as renal function improves.  - sliding scale  - follow up A1C  - Gabapentin reduced to  TID given renal function    PE (saddle embolus) status post IVC filter  supratherapeutic INR  The PE was diagnosed 3 years ago at [facility] with IVC placed with plan for lifelong AC given unprovoked and severity.  Feb V/Q without evidence of PE.  - Plan to initiate hep gtt if INR <2 in preparation for potential ophtho procedure, gave coumadin  with goal 2-2.5mg   - will re-initiate warfarin once evaluated by ophtho and if no plans for procedure    Glaucoma  vitreal hemorrhage  diabetic retinopathy  Right eye blindness  Transferred to [place] for urgent retinal surgery for elevated IOP, underwent anterior chamber tap with improved IOP at [facility], due to have surgery but held off due to elevated INR.    -  Plan for OR Thursday with ophtho if INR<2.5  -brimonidine, latanoprost, cosopt    Constipation - RESOLVED  Last BM 2 days prior admission and required enema, no evidence of obstruction, no prior abdominal surgeries, benign abd exam with bowel sounds.  Resolved with aggressive bowel regimen    Iron deficiency anemia  Hemoglobin at baseline of ~8, no evidence of blood loss, receives iron infusions (last dose 5/18), last iron studies in 08/2015 with ferritin 286 and Fe % saturation 15. Iron studies unrevealing  -monitor CBC    Left foot fracture  Recent Charcot's neuropathic osteoarthropathy left foot versus neuropathic fracture of the navicular bone and fifth metatarsal. Seen by ortho with worsening osteonecrosis on xray  -strict NWB  -CAM boot  -PT rec SNF  - Follow up OT    Vulvar ulcerated lesion  Seen by gynecology on 5/16, swab positive for HSV 2, no Rx found in outside  records for acyclovir or other antiviral  -consider starting acyclovir or other antiviral if renal function stable  -follow up with gynecology    FEN: renal/diabetic diet  DVT Prophylaxis: warfarin --> hep gtt if INR <2, goal INR 2-2.5 prior procedure  Code status: Full  Dispo: SNF when medically stable --> patient expressed dissatisfication with [facility]   Ophtho procedure, need improving Cr and stable INR prior to discharge.    Approximately 30 minutes was spent with the patient, the majority of which was spent reviewing the records, developing a treatment plan, calling consultant services as needed, and counseling the patient and family on the treatment plan, including risks and benefits of the treatment options.    Lennox Solders, MD  Internal Medicine  Hospitalist  PI: [phone]  Pager: [phone]

## 2015-10-23 NOTE — Nurse Assessment (Signed)
Bedside report received from night shift nurse. Droplet precautions are in place. Pt appears to be sleeping in bed with no distress noted, respirations even and unlabored. POC reviewed, will review with pt when she awakens. Pt is NPO for planned surgery today. Orders and labs reviewed. Assuming pt care at this time.

## 2015-10-23 NOTE — Plan of Care (Signed)
Problem: Patient Care Overview (Adult)  Goal: Plan of Care Review  Outcome: Ongoing (interventions implemented as appropriate)  Goal Outcome Evaluation Note     Leslie Hernandez is a [age]yr female admitted 10/20/2015      OUTCOME SUMMARY AND PLAN MOVING FORWARD:   Pt up to bedside commode this morning independently with supervision and tolerated well. Plan is for pt to have surgery on her right eye on Thursday. Will continue to monitor.   Goal: Individualization and Mutuality  Outcome: Ongoing (interventions implemented as appropriate)  Goal: Discharge Needs Assessment  Outcome: Ongoing (interventions implemented as appropriate)    Problem: Sleep Pattern Disturbance (Adult)  Goal: Adequate Sleep/Rest  Patient will demonstrate the desired outcomes by discharge/transition of care.   Outcome: Ongoing (interventions implemented as appropriate)    Problem: Urine Elimination, Impaired (Adult)  Goal: Effective Containment of Urine  Patient will demonstrate the desired outcomes by discharge/transition of care.   Outcome: Ongoing (interventions implemented as appropriate)  Goal: Reduced Incontinence Episodes  Patient will demonstrate the desired outcomes by discharge/transition of care.   Outcome: Ongoing (interventions implemented as appropriate)    Problem: Fall Risk (Adult)  Goal: Absence of Falls  Patient will demonstrate the desired outcomes by discharge/transition of care.   Outcome: Ongoing (interventions implemented as appropriate)    Problem: Mobility, Physical Impaired (Adult)  Goal: Enhanced Mobility Skills  Patient will demonstrate the desired outcomes by discharge/transition of care.   Outcome: Ongoing (interventions implemented as appropriate)  Goal: Enhanced Functionality Ability  Patient will demonstrate the desired outcomes by discharge/transition of care.   Outcome: Ongoing (interventions implemented as appropriate)    Problem: Fluid Volume Excess (Adult,Obstetrics,Pediatric)  Goal: Stable Weight  Patient will  demonstrate the desired outcomes by discharge/transition of care.   Outcome: Ongoing (interventions implemented as appropriate)    Problem: Pain, Acute (Adult)  Goal: Acceptable Pain Control/Comfort Level  Patient will demonstrate the desired outcomes by discharge/transition of care.   Outcome: Ongoing (interventions implemented as appropriate)    Problem: Pressure Ulcer Risk (Braden Scale) (Adult,Obstetrics,Pediatric)  Goal: Skin Integrity  Patient will demonstrate the desired outcomes by discharge/transition of care.   Outcome: Ongoing (interventions implemented as appropriate)

## 2015-10-23 NOTE — Allied Health Consult (Signed)
CLINICAL CASE MANAGEMENT  ASSESSMENT NOTE    Name: Leslie Hernandez  MRN: [id number]   Date of Birth:[birth year] (17yr Gender:female    Note Date: 10/23/2015 Note Time: 15:21   Date of Service: 10/23/2015 Time of Service: 1500     Patient able to participate in plan? Alert and oriented  Contact Person/Caregiver if unable to participate  : none   [phone] Family/Friends to assist: SNF  [facility] Identified / Phone Number:  unk  Preferred Pharmacy:  none  [facility] [id number] Discharge Address:  TBD  Pre-existing Lines/Drains/Wounds: none  Pre-Hospitalization Level of Care: mobility and ADL assist  Current Functional Status: as above  Home Health and/or Resources in Place: none, though [facility] none  Potential Barriers to Discharge: wants different facility   Anticipated DC Needs: SNF referral   Patient/Family offered choice of provider and agreeable with plan/Referrals? yes  Patient/family provided with long-term care community resources:  yes  Comments: Case reviewed, met with patient at bedside, states she was renting a room locally but owners were doing drugs, [facility] fell, [facility] and [facility] alternate [facility]. [facility] as patient was managed [facility] with KIvar Bury now reviews patient is straight [facility], she will discuss with her business office, SNF referral started.  Plan/Follow-up needed: Stability for discharge, [facility].  Electronically Signed by:    GEarl Many RN  Case Manager/Discharge Planner  Pager: [phone] Phone: [phone] Fax: [phone]

## 2015-10-23 NOTE — Nurse Assessment (Signed)
ASSESSMENT NOTE          Initial assessment completed and recorded in EMR.  Report received from day shift nurse and orders reviewed. Plan of Care reviewed and appropriate, discussed with patient. Patient just got her requested tuna sandwich for dinner. Due insulin given. Patient's goal for this shift is to be able to sleep well. Will keep patient safe and comfortable. Will continue to monitor. Marshell GarfinkelLoradyl Mahogany Torrance,  RN

## 2015-10-24 LAB — CBC NO DIFFERENTIAL
HEMATOCRIT: 29.5 % — AB (ref 34–46)
HEMOGLOBIN: 9.2 g/dL — AB (ref 12.0–16.0)
MCH: 22 pg — AB (ref 27–33)
MCHC: 31.1 % — AB (ref 32–36)
MCV: 70.8 UM3 — AB (ref 80–100)
MPV: 7 UM3 (ref 6.8–10.0)
PLATELET COUNT: 297 10*3/uL (ref 130–400)
RDW: 18.7 U — AB (ref 0–14.7)
RED CELL COUNT: 4.17 10*6/uL (ref 3.7–5.5)
WHITE BLOOD CELL COUNT: 5 10*3/uL (ref 4.5–11.0)

## 2015-10-24 LAB — APTT STUDIES
APTT: 30.6 s (ref 24.1–36.7)
APTT: 42.8 s — AB (ref 24.1–36.7)

## 2015-10-24 LAB — BASIC METABOLIC PANEL
CALCIUM: 8 mg/dL — AB (ref 8.6–10.5)
CARBON DIOXIDE TOTAL: 22 meq/L — AB (ref 24–32)
CHLORIDE: 109 meq/L (ref 95–110)
CREATININE BLOOD: 3.59 mg/dL — AB (ref 0.44–1.27)
E-GFR, AFRICAN AMERICAN: 16 SEE NOTE — AB (ref >60)
E-GFR, NON-AFRICAN AMERICAN: 14 — AB (ref >60)
GLUCOSE: 157 mg/dL — AB (ref 70–99)
POTASSIUM: 4 meq/L (ref 3.3–5.0)
SODIUM: 139 meq/L (ref 135–145)
UREA NITROGEN, BLOOD (BUN): 39 mg/dL — AB (ref 8–22)

## 2015-10-24 LAB — C DIFFICILE SURVEILLANCE TEST: C DIFFICILE SURV RESULT: NEGATIVE

## 2015-10-24 LAB — INR
INR: 1.41 — AB (ref 0.87–1.18)
INR: 1.28 — AB (ref 0.87–1.18)

## 2015-10-24 LAB — PHOSPHORUS (PO4): PHOSPHORUS (PO4): 4.3 mg/dL (ref 2.4–5.0)

## 2015-10-24 LAB — MAGNESIUM (MG): MAGNESIUM (MG): 2 mg/dL (ref 1.5–2.6)

## 2015-10-24 MED ORDER — LIDOCAINE 5 % TOPICAL PATCH
2.0000 | MEDICATED_PATCH | TOPICAL | Status: DC
Start: 2015-10-24 — End: 2015-11-18
  Administered 2015-10-24: 1 via TRANSDERMAL
  Administered 2015-10-25 – 2015-10-31 (×7): 2 via TRANSDERMAL
  Administered 2015-11-01 – 2015-11-16 (×16): 1 via TRANSDERMAL
  Filled 2015-10-24 (×25): qty 2

## 2015-10-24 MED ORDER — HEPARIN (PORCINE) 25,000 UNIT/250 ML (100 UNIT/ML) IN DEXTROSE 5 % IV
100.0000 [IU]/h | INTRAVENOUS | Status: DC
Start: 2015-10-24 — End: 2015-10-25
  Administered 2015-10-24: 1000 [IU]/h via INTRAVENOUS
  Filled 2015-10-24: qty 1

## 2015-10-24 MED ORDER — WARFARIN 2 MG TABLET
5.0000 mg | ORAL_TABLET | Freq: Once | ORAL | Status: DC
Start: 2015-10-24 — End: 2015-10-24

## 2015-10-24 MED ORDER — REMOVE LIDOCAINE PATCH
1.0000 | Status: DC
Start: 2015-10-25 — End: 2015-11-18
  Administered 2015-10-25 – 2015-11-17 (×21): 1 via TRANSDERMAL

## 2015-10-24 MED ORDER — HYDROMORPHONE 2 MG TABLET
1.0000 mg | ORAL_TABLET | ORAL | Status: DC | PRN
Start: 2015-10-24 — End: 2015-10-26
  Administered 2015-10-24 – 2015-10-26 (×4): 1 mg via ORAL
  Filled 2015-10-24 (×4): qty 1

## 2015-10-24 NOTE — Plan of Care (Signed)
Problem: Patient Care Overview (Adult)  Goal: Plan of Care Review  Outcome: Ongoing (interventions implemented as appropriate)  Goal Outcome Evaluation Note     Leslie Hernandez is a [age]yr female admitted 10/20/2015      OUTCOME SUMMARY AND PLAN MOVING FORWARD:   Patient has been stable. Elevated blood sugar in the 200s, coveredd with Novolog SS. [facility]. Slept well. VSS      Goal: Individualization and Mutuality  Outcome: Ongoing (interventions implemented as appropriate)  Goal: Discharge Needs Assessment  Outcome: Ongoing (interventions implemented as appropriate)    Problem: Sleep Pattern Disturbance (Adult)  Goal: Adequate Sleep/Rest  Patient will demonstrate the desired outcomes by discharge/transition of care.   Outcome: Ongoing (interventions implemented as appropriate)    Problem: Urine Elimination, Impaired (Adult)  Goal: Effective Urinary Elimination  Patient will demonstrate the desired outcomes by discharge/transition of care.   Outcome: Ongoing (interventions implemented as appropriate)  Goal: Effective Containment of Urine  Patient will demonstrate the desired outcomes by discharge/transition of care.   Outcome: Ongoing (interventions implemented as appropriate)  Goal: Reduced Incontinence Episodes  Patient will demonstrate the desired outcomes by discharge/transition of care.   Outcome: Ongoing (interventions implemented as appropriate)    Problem: Mobility, Physical Impaired (Adult)  Goal: Enhanced Mobility Skills  Patient will demonstrate the desired outcomes by discharge/transition of care.   Outcome: Ongoing (interventions implemented as appropriate)  Goal: Enhanced Functionality Ability  Patient will demonstrate the desired outcomes by discharge/transition of care.   Outcome: Ongoing (interventions implemented as appropriate)    Problem: Fluid Volume Excess (Adult,Obstetrics,Pediatric)  Goal: Stable Weight  Patient will demonstrate the desired outcomes by  discharge/transition of care.   Outcome: Ongoing (interventions implemented as appropriate)    Problem: Pain, Acute (Adult)  Goal: Acceptable Pain Control/Comfort Level  Patient will demonstrate the desired outcomes by discharge/transition of care.   Outcome: Ongoing (interventions implemented as appropriate)    Problem: Pressure Ulcer Risk (Braden Scale) (Adult,Obstetrics,Pediatric)  Goal: Skin Integrity  Patient will demonstrate the desired outcomes by discharge/transition of care.   Outcome: Ongoing (interventions implemented as appropriate)

## 2015-10-24 NOTE — Progress Notes (Signed)
INTERNAL MEDICINE DAILY PROGRESS NOTE  Date: 10/24/2015  Time:  22:13      INTERVAL HISTORY:  No overnight events.  - Started hep gtt as INR <2 WILL need to be stopped prior OR    SUBJECTIVE:  Patient reports back pain - improved with lidocaine patch.  Small dose pO dilaudid made avail as well.    MEDICATIONS:  Scheduled Medications  Amlodipine (NORVASC) Tablet 10 mg, ORAL, QAM  Atorvastatin (LIPITOR) Tablet 80 mg, ORAL, Daily Bedtime  Brimonidine (ALPHAGAN) 0.2 % Ophthalmic Solution 1 drop, BOTH Eyes, TID  Docusate (COLACE) Capsule 100 mg, ORAL, BID  Dorzolamide 2%/Timolol 0.5% (COSOPT) Ophthalmic Solution 1 drop, BOTH Eyes, BID  FUROsemide (LASIX) Tablet 40 mg, ORAL, QAM  Gabapentin (NEURONTIN) Capsule 100 mg, ORAL, TID  HydrALAZINE (APRESOLINE) Tablet 100 mg, ORAL, TID  Insulin Aspart (NOVOLOG) Injection Pen 1-4 Units, SUBCUTANEOUS, Daily Bedtime  Insulin Aspart (NOVOLOG) Injection Pen 1-6 Units, SUBCUTANEOUS, TID w/ meals - correction dose  Latanoprost (XALATAN) 0.005 % Ophthalmic Solution 1 drop, BOTH Eyes, Daily Bedtime  Lidocaine (LIDODERM) 5 %(700 mg/patch) Patch 2 patch, Transdermal, Q24H Now  [START ON 10/25/2015] Lidocaine patch REMOVAL 1 patch, Transdermal, Q24H Now  Polyethylene Glycol 3350 (MIRALAX) Oral Powder Packet 17 g, ORAL, QAM  Sennosides (SENOKOT) Tablet 17.2 mg, ORAL, Daily Bedtime  Warfarin (COUMADIN) Tablet 5 mg, ORAL, ONCE  Warfarin Patient Flag, NOT APPLICABLE, PATIENT FLAG ONLY    IV Medications  Heparin, 100-1,500 Units/hr, IV, CONTINUOUS, Last Rate: 1,100 Units/hr (10/24/15 1930)    PRN Medications  Acetaminophen (TYLENOL) Tablet 650 mg, ORAL, Q4H PRN  Bisacodyl (DULCOLAX) Suppository 10 mg, RECTALLY, Q24H PRN  Dextrose 50% Injection 12.5 g, IV, PRN  Dextrose 50% Injection 25 g, IV, PRN  Glucagon Injection 1 mg, IM, PRN  HydrALAZINE (APRESOLINE) Injection 10 mg, IV, Q4H PRN  Hydromorphone (DILAUDID) Tablet 1 mg, ORAL, Q4H PRN  Lactulose (CHRONULAC) 10 gram/15 mL Solution 30 mL, ORAL, Q6H  PRN  Mineral Oil (FLEET) Enema 1 enema, RECTALLY, Bedtime PRN  Ondansetron (ZOFRAN) Injection 4 mg, IV, Q8H PRN        OBJECTIVE:   Vital Signs  Summary  Temp Min: 36.9 C (98.5 F) Max: 37.1 C (98.8 F)  BP: (138-169)/(82-92)   Pulse Min: 74 Max: 88  Resp Min: 16 Max: 16  SpO2 Min: 97 % Max: 98 %      Current Vitals  Temp: 37.1 C (98.7 F)  BP: 169/90  Pulse: 88  Resp: 16  SpO2: 98 %      Weight: 112.6 kg (248 lb 3.8 oz)     Intake and Output  Last Two Completed Shifts  In: 720 [Oral:720]  Out: 1475 [Urine:1475]    Current Shift         Physical Exam  General: obese female, No acute distress, conversational. A+Ox3  HEENT:  Moist oral mucosa.  Right sclera injected and globe mild protrusion  CV: Regular rate and rhythm, nl s1s2, no murmurs, rubs or gallops. No JVD.  Pulm: CTAB  good symmetric air movement  Abd: Soft NT ND, +BS. No rebound or guarding  Skin: No rashes. No jaundice.  Neuro: face symmetric, moving all 4 extremities spontaneously  Extrem:  trace LE edema.      LINES AND DRAINS:   piv    LAB TESTS/STUDIES:     I personally reviewed the following report(s)  .    Lab Results - 24 hours (excluding micro and POC)   BASIC METABOLIC PANEL  Status: Abnormal   Result Value Status    SODIUM 139 Final    POTASSIUM 4.0 Final    CHLORIDE 109 Final    CARBON DIOXIDE TOTAL 22 (L) Final    UREA NITROGEN, BLOOD (BUN) 39 (H) Final    CREATININE BLOOD 3.59 (H) Final    E-GFR, AFRICAN AMERICAN 16 (L) Final    E-GFR, NON-AFRICAN AMERICAN 14 (L) Final    GLUCOSE 157 (H) Final    CALCIUM 8.0 (L) Final   MAGNESIUM (MG)     Status: None   Result Value Status    MAGNESIUM (MG) 2.0 Final   PHOSPHORUS (PO4)     Status: None   Result Value Status    PHOSPHORUS (PO4) 4.3 Final   INR     Status: Abnormal   Result Value Status    INR 1.41 (H) Final   INR     Status: Abnormal   Result Value Status    INR 1.28 (H) Final   APTT STUDIES     Status: None   Result Value Status    APTT 30.6 Final   CBC NO DIFFERENTIAL      Status: Abnormal   Result Value Status    WHITE BLOOD CELL COUNT 5.0 Final    RED CELL COUNT 4.17 Final    HEMOGLOBIN 9.2 (L) Final    HEMATOCRIT 29.5 (L) Final    MCV 70.8 (L) Final    MCH 22.0 (L) Final    MCHC 31.1 (L) Final    RDW 18.7 (H) Final    MPV 7.0 Final    PLATELET COUNT 297 Final   APTT STUDIES     Status: Abnormal   Result Value Status    APTT 42.8 (H) Final     INFLUENZA A Negative  Negative  Final     INFLUENZA B Negative  Negative  Final    RESPIRATORY SYNCYTIAL VIRUS Negative  Negative  Final    PARAINFLUENZA Negative  Negative  Final    CORONAVIRUS Negative  Negative  Final    HUMAN METAPNEUMOVIRUS Negative  Negative  Final    RHINOVIRUS/ENTEROVIRUS POSITIVE (Abnl) Negative  Final    ADENOVIRUS Negative  Negative  Final    HUMAN BOCAVIRUS Negative  Negative  Final    CHLAMYDOPHILA PNEUMONIAE Negative  Negative  Final    MYCOPLASMA PNEUMONIAE Negative  Negative  Final    Comment:         NEW IMAGES:  None    ASSESSMENT & PLAN:    50 year old female past medical history of DM-2, CKD-4, PE on warfarin, dCHF, HTN with CHF exacerbation and subacute vitreal hemorrhage requiring ophtho retinal surgery however patient admitted with elevated INR.    Acute on chronic heart failure, diastolic  Pericardial effusion  Patient with preserved EF. Presented in decompensated heart failure with volume overload requiring iv lasix.  Also with small pericardial effusion likely due to volume overload.  - Patient with Cr, but no longer appears volume overloaded and Cr rising.  Re-initiated home oral medication  - home lasix  po daily, monitor renal function  - Fluid restriction, daily weight    Cough  rhinorhea  Patient with rhinovirus.   Patient given vanc/cefepime in ED, but no evidence pneumonia and with viral URI.  Monitor off abx.  - supportive care    AKI on CKD  Patient with baseline Cr 2.3.  Presented with Cr 3.5 rose to 3.7 with diureisis.    -  renally dose medications and avoid nephrotoxins  -  Resume home po lasix as euvolemic/dry on exam    HTN  Hypertensive on admission, likely related to volume overload.  Monitor with diuresis.  - cont home amlodipine, hydralazine.  Have hydralazine iv PRN available.    DM2  Peripheral neuropathy  Retinopathy  A1c in march was 9.8  Patient with BG normal with sliding scale alone despite home long acting.  Monitor and discharge on appropriate regimen.  Potentially discontinue home insulin, but will follow as renal function improves.  - sliding scale  - follow up A1C  - Gabapentin reduced to 100mg  TID given renal function    PE (saddle embolus) status post IVC filter  supratherapeutic INR  The PE was diagnosed 3 years ago at [facility] with IVC placed with plan for lifelong AC given unprovoked and severity.  Feb V/Q without evidence of PE.  - Plan to initiate hep gtt if INR <2 in preparation for potential ophtho procedure, gave coumadin 5mg  with goal 2-2.5mg   - will re-initiate warfarin once evaluated by ophtho and if no plans for procedure    Glaucoma  vitreal hemorrhage  diabetic retinopathy  Right eye blindness  Transferred to [place] for urgent retinal surgery for elevated IOP, [facility], due to have surgery but held off due to elevated INR.    -  Plan for OR Thursday with ophtho if INR<2.5  -brimonidine, latanoprost, cosopt    Constipation - RESOLVED  Last BM 2 days prior admission and required enema, no evidence of obstruction, no prior abdominal surgeries, benign abd exam with bowel sounds.  Resolved with aggressive bowel regimen    Iron deficiency anemia  Hemoglobin at baseline of ~8, no evidence of blood loss, receives iron infusions (last dose 5/18), last iron studies in 08/2015 with ferritin 286 and Fe % saturation 15. Iron studies unrevealing  -monitor CBC    Left foot fracture  Recent Charcot's neuropathic osteoarthropathy left foot versus neuropathic fracture of the navicular bone and fifth metatarsal. Seen  by ortho with worsening osteonecrosis on xray  -strict NWB  -CAM boot  -PT rec SNF  - Follow up OT  - Follow up ortho outpt    Vulvar ulcerated lesion  Seen by gynecology on 5/16, swab positive for HSV 2, no Rx found in outside records for acyclovir or other antiviral  -consider starting acyclovir or other antiviral if renal function stable  -follow up with gynecology    FEN: renal/diabetic diet  DVT Prophylaxis: warfarin --> hep gtt if INR <2, goal INR 2-2.5 prior procedure  Code status: Full  Dispo: SNF when medically stable --> [facility]   Ophtho procedure, need improving Cr and stable INR prior to discharge.    Approximately 30 minutes was spent with the patient, the majority of which was spent reviewing the records, developing a treatment plan, calling consultant services as needed, and counseling the patient and family on the treatment plan, including risks and benefits of the treatment options.    Lennox SoldersEric Blaize Nipper, MD  Internal Medicine  Hospitalist  PI: [id number]  Pager: [phone]

## 2015-10-24 NOTE — Plan of Care (Signed)
Problem: Patient Care Overview (Adult)  Goal: Plan of Care Review  Outcome: Ongoing (interventions implemented as appropriate)  Goal Outcome Evaluation Note     Leslie Hernandez is a [age]yr female admitted 10/20/2015      OUTCOME SUMMARY AND PLAN MOVING FORWARD:   Plan of care reviewed with pt, she verbalizes understanding  And states she has no further questions. Pt VSS throughout shift, able to transfer to commode with walker and 1 assist. Heparin drip started at 1205 due to INR<2. Continuing to monitor. Pt AAOx4, emotional  But cooperative during the day, tearful about daughter's death and recent SNF experience. Therapeutic communication used, pt calmer at end of shift. Pt reported increasing pain in L flank throughout shift, Lidocaine patch and 1mg  Dilaudid PO ordered. Pt reports better pain management. Plan to be NPO after midnight for surgery tomorrow.  Goal: Individualization and Mutuality  Outcome: Ongoing (interventions implemented as appropriate)  Goal: Discharge Needs Assessment  Outcome: Ongoing (interventions implemented as appropriate)    Problem: Sleep Pattern Disturbance (Adult)  Goal: Adequate Sleep/Rest  Patient will demonstrate the desired outcomes by discharge/transition of care.   Outcome: Ongoing (interventions implemented as appropriate)    Problem: Urine Elimination, Impaired (Adult)  Goal: Effective Urinary Elimination  Patient will demonstrate the desired outcomes by discharge/transition of care.   Outcome: Ongoing (interventions implemented as appropriate)  Goal: Effective Containment of Urine  Patient will demonstrate the desired outcomes by discharge/transition of care.   Outcome: Ongoing (interventions implemented as appropriate)  Goal: Reduced Incontinence Episodes  Patient will demonstrate the desired outcomes by discharge/transition of care.   Outcome: Ongoing (interventions implemented as appropriate)    Problem: Fall Risk (Adult)  Goal: Absence of Falls  Patient will demonstrate the  desired outcomes by discharge/transition of care.   Outcome: Ongoing (interventions implemented as appropriate)    Problem: Mobility, Physical Impaired (Adult)  Goal: Enhanced Mobility Skills  Patient will demonstrate the desired outcomes by discharge/transition of care.   Outcome: Ongoing (interventions implemented as appropriate)  Goal: Enhanced Functionality Ability  Patient will demonstrate the desired outcomes by discharge/transition of care.   Outcome: Ongoing (interventions implemented as appropriate)    Problem: Fluid Volume Excess (Adult,Obstetrics,Pediatric)  Goal: Stable Weight  Patient will demonstrate the desired outcomes by discharge/transition of care.   Outcome: Ongoing (interventions implemented as appropriate)  Goal: Balanced Intake/Output  Patient will demonstrate the desired outcomes by discharge/transition of care.   Outcome: Ongoing (interventions implemented as appropriate)    Problem: Pain, Acute (Adult)  Goal: Acceptable Pain Control/Comfort Level  Patient will demonstrate the desired outcomes by discharge/transition of care.   Outcome: Ongoing (interventions implemented as appropriate)    Problem: Pressure Ulcer Risk (Braden Scale) (Adult,Obstetrics,Pediatric)  Goal: Skin Integrity  Patient will demonstrate the desired outcomes by discharge/transition of care.   Outcome: Ongoing (interventions implemented as appropriate)

## 2015-10-24 NOTE — Nurse Assessment (Signed)
ASSESSMENT NOTE    Note Started: 10/24/2015, 07:41     Initial assessment completed and recorded in EMR.  Report received from night shift nurse and orders reviewed. Patient asleep this AM, responded to voice during VS check but nodded that she preferred to sleep longer. VSS with hypertension. Will give Hydralizine once patient reaches admin parameters. The plan for the day is safety, pain management, rest with mild mobilization r/t no weight bearing restrictions on L leg. Commode at bedside; 1.5L fluid restrictions.   Plan of Care reviewed and appropriate, discussed with patient.   Cyndi LennertAleksandra Contessa Preuss, RN RN

## 2015-10-24 NOTE — Nurse Assessment (Signed)
ASSESSMENT NOTE    Note Started: 10/24/2015, 19:38     Initial assessment completed and recorded in EMR.  Report received from day shift nurse and orders reviewed. Plan of Care reviewed and appropriate, discussed with patient.  Mallie SnooksSalih Arch Methot, RN RN

## 2015-10-25 ENCOUNTER — Inpatient Hospital Stay (HOSPITAL_COMMUNITY): Payer: MEDICAID | Admitting: Certified Registered"

## 2015-10-25 ENCOUNTER — Encounter
Admission: EM | Disposition: A | Discharge status: Home / Self Care | Payer: Self-pay | Source: Emergency Department | Attending: PULMONARY DISEASE

## 2015-10-25 ENCOUNTER — Inpatient Hospital Stay (HOSPITAL_COMMUNITY): Payer: MEDICAID

## 2015-10-25 ENCOUNTER — Inpatient Hospital Stay: Payer: MEDICAID | Admitting: Certified Registered"

## 2015-10-25 DIAGNOSIS — E113591 Type 2 diabetes mellitus with proliferative diabetic retinopathy without macular edema, right eye: Secondary | ICD-10-CM

## 2015-10-25 DIAGNOSIS — M87275 Osteonecrosis due to previous trauma, left foot: Secondary | ICD-10-CM

## 2015-10-25 DIAGNOSIS — H4311 Vitreous hemorrhage, right eye: Secondary | ICD-10-CM

## 2015-10-25 DIAGNOSIS — H4089 Other specified glaucoma: Secondary | ICD-10-CM

## 2015-10-25 LAB — APTT STUDIES
APTT: 46.8 s — AB (ref 24.1–36.7)
APTT: 55.4 s — AB (ref 24.1–36.7)
APTT: 102.5 s — AB (ref 24.1–36.7)
APTT: 32.1 s (ref 24.1–36.7)
APTT: 31.4 s (ref 24.1–36.7)

## 2015-10-25 LAB — CBC WITH DIFFERENTIAL
BASOPHILS % AUTO: 0.4 %
BASOPHILS ABS AUTO: 0 10*3/uL (ref 0–0.2)
EOSINOPHIL % AUTO: 3.9 %
EOSINOPHIL ABS AUTO: 0.2 10*3/uL (ref 0–0.5)
HEMATOCRIT: 28.7 % — AB (ref 34–46)
HEMOGLOBIN: 9 g/dL — AB (ref 12.0–16.0)
LYMPHOCYTE ABS AUTO: 1.5 10*3/uL (ref 1.0–4.8)
LYMPHOCYTES % AUTO: 27.9 %
MCH: 21.9 pg — AB (ref 27–33)
MCHC: 31.2 % — AB (ref 32–36)
MCV: 70.2 UM3 — AB (ref 80–100)
MONOCYTES % AUTO: 6.7 %
MONOCYTES ABS AUTO: 0.4 10*3/uL (ref 0.1–0.8)
MPV: 6.9 UM3 (ref 6.8–10.0)
NEUTROPHIL ABS AUTO: 3.2 10*3/uL (ref 1.80–7.70)
NEUTROPHILS % AUTO: 61.1 %
PLATELET COUNT: 304 10*3/uL (ref 130–400)
PLATELET ESTIMATE, SMEAR: ADEQUATE
RDW: 18.4 U — AB (ref 0–14.7)
RED CELL COUNT: 4.1 10*6/uL (ref 3.7–5.5)
WHITE BLOOD CELL COUNT: 5.2 10*3/uL (ref 4.5–11.0)

## 2015-10-25 LAB — BASIC METABOLIC PANEL
CALCIUM: 8.1 mg/dL — AB (ref 8.6–10.5)
CARBON DIOXIDE TOTAL: 21 meq/L — AB (ref 24–32)
CHLORIDE: 108 meq/L (ref 95–110)
CREATININE BLOOD: 3.87 mg/dL — AB (ref 0.44–1.27)
E-GFR, AFRICAN AMERICAN: 15 SEE NOTE — AB (ref >60)
E-GFR, NON-AFRICAN AMERICAN: 13 — AB (ref >60)
GLUCOSE: 211 mg/dL — AB (ref 70–99)
POTASSIUM: 4 meq/L (ref 3.3–5.0)
SODIUM: 140 meq/L (ref 135–145)
UREA NITROGEN, BLOOD (BUN): 43 mg/dL — AB (ref 8–22)

## 2015-10-25 LAB — PHOSPHORUS (PO4): PHOSPHORUS (PO4): 4.4 mg/dL (ref 2.4–5.0)

## 2015-10-25 LAB — MAGNESIUM (MG): MAGNESIUM (MG): 2 mg/dL (ref 1.5–2.6)

## 2015-10-25 LAB — INR: INR: 1.12 (ref 0.87–1.18)

## 2015-10-25 SURGERY — VITRECTOMY, PARS PLANA APPROACH, USING LASER
Anesthesia: Monitored Anesthesia Care | Site: Eye | Laterality: Right | Wound class: Clean

## 2015-10-25 MED ORDER — PHENYLEPHRINE 2.5 % EYE DROPS
3.0000 [drp] | OPHTHALMIC | Status: AC
Start: 2015-10-25 — End: 2015-10-25
  Administered 2015-10-25: 3 [drp] via OPHTHALMIC
  Filled 2015-10-25: qty 2

## 2015-10-25 MED ORDER — CYCLOPENTOLATE 1 % EYE DROPS
3.0000 [drp] | OPHTHALMIC | Status: AC
Start: 2015-10-25 — End: 2015-10-25
  Administered 2015-10-25: 3 [drp] via OPHTHALMIC
  Filled 2015-10-25: qty 2

## 2015-10-25 MED ORDER — INTRAOP DEXAMETHASONE 4 MG/ML INJ 1 ML VIAL
Status: DC | PRN
Start: 2015-10-25 — End: 2015-10-25
  Administered 2015-10-25: .5 mL via SUBCONJUNCTIVAL

## 2015-10-25 MED ORDER — INTRAOP STERILE WATER 1000 ML IRRIGATION BOTTLE
Status: DC | PRN
Start: 2015-10-25 — End: 2015-10-25
  Administered 2015-10-25: 250 mL

## 2015-10-25 MED ORDER — NEOMYCIN 3.5 MG/G-POLYMYXIN B 10,000 UNIT/G-DEXAMETH 0.1 % EYE OINT
TOPICAL_OINTMENT | OPHTHALMIC | Status: DC | PRN
Start: 2015-10-25 — End: 2015-10-25
  Administered 2015-10-25: 1 [in_us] via TOPICAL

## 2015-10-25 MED ORDER — ONDANSETRON HCL (PF) 4 MG/2 ML INJECTION SOLUTION
4.0000 mg | INTRAMUSCULAR | Status: DC | PRN
Start: 2015-10-25 — End: 2015-10-25

## 2015-10-25 MED ORDER — HYPROMELLOSE 2 % INTRAOCULAR SYRINGE
INJECTION | INTRAOCULAR | Status: DC | PRN
Start: 2015-10-25 — End: 2015-10-25
  Administered 2015-10-25: 1 mL via TOPICAL

## 2015-10-25 MED ORDER — INTRAOP LIDOCAINE PF 2% INJ 5 ML VIAL ADDITIVE
Status: DC | PRN
Start: 2015-10-25 — End: 2015-10-25
  Administered 2015-10-25: 6 mL via RETROBULBAR

## 2015-10-25 MED ORDER — LIDOCAINE HCL 10 MG/ML (1 %) INJECTION SOLUTION
0.1000 mL | INTRAMUSCULAR | Status: DC | PRN
Start: 2015-10-25 — End: 2015-10-25

## 2015-10-25 MED ORDER — REMIFENTANIL (PF) 100 MCG/2 ML (50 MCG/ML) IN 0.9 % NACL IV SYRINGE
INJECTION | INTRAVENOUS | Status: DC | PRN
Start: 2015-10-25 — End: 2015-10-25
  Administered 2015-10-25: 100 ug via INTRAVENOUS

## 2015-10-25 MED ORDER — INTRAOP BALANCED SALT SOLUTION REGULAR 15 ML BOTTLE
Status: DC | PRN
Start: 2015-10-25 — End: 2015-10-25
  Administered 2015-10-25: 19:00:00

## 2015-10-25 MED ORDER — INTRAOP DEXMEDETOMIDINE 100 MCG/ML IV BOLUS DOSE
Status: DC | PRN
Start: 2015-10-25 — End: 2015-10-25
  Administered 2015-10-25 (×3): 8 ug via INTRAVENOUS

## 2015-10-25 MED ORDER — PROPARACAINE 0.5 % EYE DROPS
3.0000 [drp] | OPHTHALMIC | Status: AC
Start: 2015-10-25 — End: 2015-10-25
  Administered 2015-10-25: 3 [drp] via OPHTHALMIC
  Filled 2015-10-25: qty 15

## 2015-10-25 MED ORDER — TROPICAMIDE 1 % EYE DROPS
3.0000 [drp] | OPHTHALMIC | Status: AC
Start: 2015-10-25 — End: 2015-10-25
  Administered 2015-10-25: 3 [drp] via OPHTHALMIC
  Filled 2015-10-25: qty 2

## 2015-10-25 MED ORDER — MIDAZOLAM (PF) 1 MG/ML INJECTION SOLUTION
INTRAMUSCULAR | Status: DC | PRN
Start: 2015-10-25 — End: 2015-10-25
  Administered 2015-10-25 (×2): 0.5 mg via INTRAVENOUS
  Administered 2015-10-25: 1 mg via INTRAVENOUS

## 2015-10-25 MED ORDER — INTRAOP NACL 0.9% PF 10 ML INJECTION VIAL
Status: DC | PRN
Start: 2015-10-25 — End: 2015-10-25
  Administered 2015-10-25: .5 mL via SUBCONJUNCTIVAL

## 2015-10-25 MED ORDER — NACL 0.9% IV INFUSION
INTRAVENOUS | Status: DC
Start: 2015-10-25 — End: 2015-10-25
  Administered 2015-10-25 (×2): via INTRAVENOUS

## 2015-10-25 MED ORDER — NACL 0.9% IV INFUSION
INTRAVENOUS | Status: DC
Start: 2015-10-25 — End: 2015-10-25

## 2015-10-25 MED ORDER — INTRAOP EPINEPHRINE 1 MG/ML PF INJ 1 ML AMPULE
Status: DC | PRN
Start: 2015-10-25 — End: 2015-10-25
  Administered 2015-10-25: 19:00:00 via INTRAOCULAR

## 2015-10-25 MED ORDER — HYDROMORPHONE 1 MG/ML INJECTION SYRINGE
0.5000 mg | INJECTION | Freq: Once | INTRAMUSCULAR | Status: AC
Start: 2015-10-26 — End: 2015-10-25
  Administered 2015-10-25: 0.5 mg via INTRAVENOUS
  Filled 2015-10-25: qty 1

## 2015-10-25 MED ORDER — FENTANYL (PF) 50 MCG/ML INJECTION SOLUTION
25.0000 ug | INTRAMUSCULAR | Status: DC | PRN
Start: 2015-10-25 — End: 2015-10-25
  Administered 2015-10-25: 25 ug via INTRAVENOUS
  Filled 2015-10-25: qty 2

## 2015-10-25 MED ORDER — INTRAOP NACL 0.9% 1000 ML IRRIGATION BOTTLE
Status: DC | PRN
Start: 2015-10-25 — End: 2015-10-25
  Administered 2015-10-25: 250 mL

## 2015-10-25 MED ORDER — ATROPINE 1 % EYE DROPS
OPHTHALMIC | Status: DC | PRN
Start: 2015-10-25 — End: 2015-10-25
  Administered 2015-10-25: 1 [drp] via OPHTHALMIC

## 2015-10-25 SURGICAL SUPPLY — 14 items
COVER LIGHT HANDLE STERIS (Other) ×2 IMPLANT
DRAPE MAYO STAND COVER LATEX FREE (Drape) ×2 IMPLANT
DRAPE ZEISS RESIGHT HEAD COVER 30"X18" (TS-Z-1000) (Drape) IMPLANT
DRESSING EYE SHIELD METAL FOX (Dressing) ×1
DRESSING EYE SHIELD METAL FOX - LATEX ITEM (Dressing) ×1 IMPLANT
DRESSING OVAL EYE PAD 2 1/8 X 2 5/8IN (Dressing) ×2 IMPLANT
ENERGY DEVICE LASER STRIAGHT TAPERED 25GA ILLUMINATING ENDOPROBE (Other) ×2 IMPLANT
GLOVES BIOGEL SENSOR 6 1/2 TOP GLOVE LATEX BURGUNDY PACKAGE (Glove) ×2 IMPLANT
GOWN SMALL AERO BLUE AAMI 3 STANDARD (Gown) ×2 IMPLANT
HEADREST DONUT 9IN (M10-090) (Other) ×2 IMPLANT
PACK RETINAL CUSTOM (DYNJ03939420) (Pack) ×2 IMPLANT
PAD EGGCRATE HEEL & ANKLE LF (4815) (Other) ×6 IMPLANT
SCD SLEEVE CALF REG 18IN ALP 1 (Other) ×2 IMPLANT
SUTURE VICRYL 7-0 TG140-8 18IN DOUBLE ARMED VIOLET (Suture) ×2 IMPLANT

## 2015-10-25 NOTE — OR Nursing (Signed)
OR Arrive    Patient's level of consciousness: awake and complaining of discomfort    Summary report reviewed: yes    Patient transported to OR via: eye bed  Transported by: anesthesia person and circulator  Head of bed: elevated  Oxygen delivered via: N/A  Monitored enroute: yes  Assumed care.

## 2015-10-25 NOTE — Nurse Transfer Note (Signed)
PACU TRANSFER NOTE    Note Started: 10/25/2015, 21:37     [facility] with continuous pulse oximetry at 2133 hours. Bed down, locked, side rails up times 4, and call light in reach. Belongings with not applicable. Report in EMR and Charge Nurse Alinda Moneyony notified of arrival. Assessment unchanged. Tedd SiasMichael Leif Loflin, RN

## 2015-10-25 NOTE — Nurse Assessment (Signed)
ASSESSMENT NOTE    Note Started: 10/25/2015, 07:56     Initial assessment completed and recorded in EMR.  Report received from night shift nurse and orders reviewed. Patient is npo pending eye surgery, heparin drip dc'd per order. Pt in bed resting given lack of sleep last night. Pt reports L flank pain 7 out of 10, positioned with comfort. Pt is oriented to call light. Plan of Care reviewed and appropriate, discussed with patient.  Claudette Headuth Alandis Bluemel, RN.

## 2015-10-25 NOTE — Anesthesia Postprocedure Evaluation (Addendum)
Patient: Leslie Hernandez    VITRECTOMY, PARS PLANA APPROACH, USING LASER    Anesthesia Type: MAC    Vital Signs (Last Recorded):  BP: 157/84  Pulse: 69  Resp: 19  Temp Max: 37 C (98.6 F)  (Last 24 hours)  Temp: 36.5 C (97.7 F)  SpO2: 100 % on    O2 Device: room air    Anesthesia Post Evaluation    Procedure: Procedure(s):VITRECTOMY, PARS PLANA APPROACH, USING LASER  [facility]  Anesthesia: Monitored Anesthesia Care    Patient location during evaluation: PACU  Patient participation: complete - patient participated  Level of consciousness: sleepy but conscious  Pain management: satisfactory to patient  Airway patency: patent  Anesthetic complications: no  Cardiovascular status: stable  Respiratory status: room air  Hydration status: euvolemic  Comments: 50 yo F s/p vitrectomy, endolaser panretinal laser photocoagulation left eye. She received 25mcg fentanyl. Stable for discharge to the floor.      Marlin CanaryJanell L Aguirre, MD    Agustina CaroliJanell Aguirre, MD  Resident, [place]-2 ([id number])  Dept of Anesthesiology and Pain Medicine  PI # [phone]  Pager: [phone]      I agree with the assessment and plan as outlined in the resident's note.    Luther ParodyAmir Juancarlos Crescenzo, MD  Department of Anesthesiology and Pain Medicine  Associate Physician  Pager#: [phone]  PI#: [phone]    10/25/2015

## 2015-10-25 NOTE — Plan of Care (Signed)
Problem: Patient Care Overview (Adult)  Goal: Plan of Care Review  Outcome: Ongoing (interventions implemented as appropriate)  Goal Outcome Evaluation Note     Leslie Hernandez is a [age]yr female admitted 10/20/2015      OUTCOME SUMMARY AND PLAN MOVING FORWARD:   Patient remain alert and oriented x3. VSS except for bp at 160-170s. Pt has intermittent non productive cough. Remain npo for eye surgery this evening. Loose bms x2; unable to collect urine samples this shift. Xray done to L foot. Pt require assistants to bedside commode, uses walker to ambulate. Pt is able to roll and turn in bed with minimal assist. No other issues, monitored closely.  Goal: Discharge Needs Assessment  Outcome: Ongoing (interventions implemented as appropriate)    Problem: Sleep Pattern Disturbance (Adult)  Goal: Adequate Sleep/Rest  Patient will demonstrate the desired outcomes by discharge/transition of care.   Outcome: Ongoing (interventions implemented as appropriate)    Problem: Urine Elimination, Impaired (Adult)  Goal: Effective Urinary Elimination  Patient will demonstrate the desired outcomes by discharge/transition of care.   Outcome: Ongoing (interventions implemented as appropriate)  Goal: Effective Containment of Urine  Patient will demonstrate the desired outcomes by discharge/transition of care.   Outcome: Ongoing (interventions implemented as appropriate)  Goal: Reduced Incontinence Episodes  Patient will demonstrate the desired outcomes by discharge/transition of care.   Outcome: Ongoing (interventions implemented as appropriate)    Problem: Fall Risk (Adult)  Goal: Absence of Falls  Patient will demonstrate the desired outcomes by discharge/transition of care.   Outcome: Ongoing (interventions implemented as appropriate)    Problem: Mobility, Physical Impaired (Adult)  Goal: Enhanced Mobility Skills  Patient will demonstrate the desired outcomes by discharge/transition of care.   Outcome: Ongoing (interventions implemented as  appropriate)  Goal: Enhanced Functionality Ability  Patient will demonstrate the desired outcomes by discharge/transition of care.   Outcome: Ongoing (interventions implemented as appropriate)    Problem: Fluid Volume Excess (Adult,Obstetrics,Pediatric)  Goal: Stable Weight  Patient will demonstrate the desired outcomes by discharge/transition of care.   Outcome: Ongoing (interventions implemented as appropriate)  Goal: Balanced Intake/Output  Patient will demonstrate the desired outcomes by discharge/transition of care.   Outcome: Ongoing (interventions implemented as appropriate)    Problem: Pain, Acute (Adult)  Goal: Acceptable Pain Control/Comfort Level  Patient will demonstrate the desired outcomes by discharge/transition of care.   Outcome: Ongoing (interventions implemented as appropriate)    Problem: Pressure Ulcer Risk (Braden Scale) (Adult,Obstetrics,Pediatric)  Goal: Skin Integrity  Patient will demonstrate the desired outcomes by discharge/transition of care.   Outcome: Ongoing (interventions implemented as appropriate)

## 2015-10-25 NOTE — Progress Notes (Signed)
INTERNAL MEDICINE DAILY PROGRESS NOTE  Date: 10/25/2015  Time:  22:19      INTERVAL HISTORY:  No overnight events.  - or today with retinal team  - hep gtt stopped this morning.  Erroneous PTT delayed surgery, correct lab revealed appropriate PTT level giving time hep gtt had been stopped.    SUBJECTIVE:  Patient with ongoing back pain.  Improved with lidocaine patch.  Hungry while npo.  Anxious about surgery.    MEDICATIONS:  Scheduled Medications  Amlodipine (NORVASC) Tablet 10 mg, ORAL, QAM  Atorvastatin (LIPITOR) Tablet 80 mg, ORAL, Daily Bedtime  Brimonidine (ALPHAGAN) 0.2 % Ophthalmic Solution 1 drop, BOTH Eyes, TID  Docusate (COLACE) Capsule 100 mg, ORAL, BID  Dorzolamide 2%/Timolol 0.5% (COSOPT) Ophthalmic Solution 1 drop, BOTH Eyes, BID  FUROsemide (LASIX) Tablet 40 mg, ORAL, QAM  Gabapentin (NEURONTIN) Capsule 100 mg, ORAL, TID  HydrALAZINE (APRESOLINE) Tablet 100 mg, ORAL, TID  Insulin Aspart (NOVOLOG) Injection Pen 1-4 Units, SUBCUTANEOUS, Daily Bedtime  Insulin Aspart (NOVOLOG) Injection Pen 1-6 Units, SUBCUTANEOUS, TID w/ meals - correction dose  Latanoprost (XALATAN) 0.005 % Ophthalmic Solution 1 drop, BOTH Eyes, Daily Bedtime  Lidocaine (LIDODERM) 5 %(700 mg/patch) Patch 2 patch, Transdermal, Q24H Now  Lidocaine patch REMOVAL 1 patch, Transdermal, Q24H Now  Polyethylene Glycol 3350 (MIRALAX) Oral Powder Packet 17 g, ORAL, QAM  Sennosides (SENOKOT) Tablet 17.2 mg, ORAL, Daily Bedtime  Warfarin Patient Flag, NOT APPLICABLE, PATIENT FLAG ONLY    IV Medications   PRN Medications  Acetaminophen (TYLENOL) Tablet 650 mg, ORAL, Q4H PRN  Bisacodyl (DULCOLAX) Suppository 10 mg, RECTALLY, Q24H PRN  Dextrose 50% Injection 12.5 g, IV, PRN  Dextrose 50% Injection 25 g, IV, PRN  Glucagon Injection 1 mg, IM, PRN  HydrALAZINE (APRESOLINE) Injection 10 mg, IV, Q4H PRN  Hydromorphone (DILAUDID) Tablet 1 mg, ORAL, Q4H PRN  Lactulose (CHRONULAC) 10 gram/15 mL Solution 30 mL, ORAL, Q6H PRN  Mineral Oil (FLEET) Enema 1  enema, RECTALLY, Bedtime PRN  Ondansetron (ZOFRAN) Injection 4 mg, IV, Q8H PRN        OBJECTIVE:   Vital Signs  Summary  Temp Min: 36.5 C (97.7 F) Max: 37 C (98.6 F)  BP: (134-173)/(80-100)   Pulse Min: 67 Max: 88  Resp Min: 15 Max: 19  SpO2 Min: 95 % Max: 100 %      Current Vitals  Temp: 36.5 C (97.7 F)  BP: (!) 160/96  Pulse: 81  Resp: 15  SpO2: 98 %      Weight: 112.6 kg (248 lb 3.8 oz)     Intake and Output  Last Two Completed Shifts  In: 625.2 [Oral:480; Crystalloid:145.2]  Out: 501 [Urine:501]    Current Shift  In: 500 [Crystalloid:500]  Out: 5 [Other:5]      Physical Exam  General: obese female, No acute distress, conversational. A+Ox3  HEENT:  Moist oral mucosa.  Right sclera injected and globe mild protrusion  CV: Regular rate and rhythm, nl s1s2, no murmurs, rubs or gallops. No JVD.  Pulm: CTAB  good symmetric air movement  Abd: Soft NT ND, +BS. No rebound or guarding  Skin: No rashes. No jaundice.  Neuro: face symmetric, moving all 4 extremities spontaneously  Extrem:  trace LE edema.      LINES AND DRAINS:   piv    LAB TESTS/STUDIES:     I personally reviewed the following report(s)  .    Lab Results - 24 hours (excluding micro and POC)   APTT STUDIES  Status: Abnormal   Result Value Status    APTT 46.8 (H) Final   BASIC METABOLIC PANEL     Status: Abnormal   Result Value Status    SODIUM 140 Final    POTASSIUM 4.0 Final    CHLORIDE 108 Final    CARBON DIOXIDE TOTAL 21 (L) Final    UREA NITROGEN, BLOOD (BUN) 43 (H) Final    CREATININE BLOOD 3.87 (H) Final    E-GFR, AFRICAN AMERICAN 15 (L) Final    E-GFR, NON-AFRICAN AMERICAN 13 (L) Final    GLUCOSE 211 (H) Final    CALCIUM 8.1 (L) Final   MAGNESIUM (MG)     Status: None   Result Value Status    MAGNESIUM (MG) 2.0 Final   PHOSPHORUS (PO4)     Status: None   Result Value Status    PHOSPHORUS (PO4) 4.4 Final   INR     Status: None   Result Value Status    INR 1.12 Final   CBC WITH DIFFERENTIAL     Status: Abnormal   Result Value Status     WHITE BLOOD CELL COUNT 5.2 Final    RED CELL COUNT 4.10 Final    HEMOGLOBIN 9.0 (L) Final    HEMATOCRIT 28.7 (L) Final    MCV 70.2 (L) Final    MCH 21.9 (L) Final    MCHC 31.2 (L) Final    RDW 18.4 (H) Final    MPV 6.9 Final    PLATELET COUNT 304 Final    NEUTROPHILS % AUTO 61.1 Final    LYMPHOCYTES % AUTO 27.9 Final    MONOCYTES % AUTO 6.7 Final    EOSINOPHIL % AUTO 3.9 Final    BASOPHILS % AUTO 0.4 Final    NEUTROPHIL ABS AUTO 3.20 Final    LYMPHOCYTE ABS AUTO 1.5 Final    MONOCYTES ABS AUTO 0.4 Final    EOSINOPHIL ABS AUTO 0.2 Final    BASOPHILS ABS AUTO 0 Final    ANISOCYTOSIS SL (Abnl) Final    MICROCYTOSIS MOD Final    HYPOCHROMIA SL Final    LEFT SHIFT NOT PRESENT Final    PLATELET ESTIMATE, SMEAR Adequate Final   APTT STUDIES     Status: Abnormal   Result Value Status    APTT 55.4 (H) Final   APTT STUDIES     Status: Abnormal   Result Value Status    APTT 102.5 (H) Corrected   APTT STUDIES     Status: None   Result Value Status    APTT 32.1 Final   APTT STUDIES     Status: None   Result Value Status    APTT 31.4 Final     INFLUENZA A Negative  Negative  Final     INFLUENZA B Negative  Negative  Final    RESPIRATORY SYNCYTIAL VIRUS Negative  Negative  Final    PARAINFLUENZA Negative  Negative  Final    CORONAVIRUS Negative  Negative  Final    HUMAN METAPNEUMOVIRUS Negative  Negative  Final    RHINOVIRUS/ENTEROVIRUS POSITIVE (Abnl) Negative  Final    ADENOVIRUS Negative  Negative  Final    HUMAN BOCAVIRUS Negative  Negative  Final    CHLAMYDOPHILA PNEUMONIAE Negative  Negative  Final    MYCOPLASMA PNEUMONIAE Negative  Negative  Final    Comment:         NEW IMAGES:  Xray left foot:  IMPRESSION:    Flattening and fragmentation of the navicular bone,  compatible with  subacute fracture. Regions of sclerosis within the navicular bone likely  reflect osteonecrosis.    Old healed fracture of the proximal fifth metatarsal.    ASSESSMENT & PLAN:    50 year old female past medical history of DM-2, CKD-4,  PE on warfarin, dCHF, HTN with CHF exacerbation and subacute vitreal hemorrhage requiring ophtho retinal surgery however patient admitted with elevated INR now status post procedure.    Acute on chronic heart failure, diastolic  Pericardial effusion  Patient with preserved EF. Presented in decompensated heart failure with volume overload requiring iv lasix.  Also with small pericardial effusion likely due to volume overload.  - Patient with Cr elevated, but no longer appears volume overloaded and Cr rising.  Re-initiated home oral medication  - STOP lasix 40mg  po daily, monitor renal function.  - Fluid restriction, daily weight    Cough  rhinorhea  Patient with rhinovirus.   Patient given vanc/cefepime in ED, but no evidence pneumonia and with viral URI.  Monitor off abx.  - supportive care    AKI on CKD  Patient with baseline Cr 2.3.  Presented with Cr 3.5 rose to 3.7 with diureisis.    - renally dose medications and avoid nephrotoxins  - STOP home po lasix as euvolemic/dry on exam  - Monitor Cr off diuretics.    - FeUrea    HTN  Hypertensive on admission, likely related to volume overload.  Monitor with diuresis.  - cont home amlodipine, hydralazine.  Have hydralazine iv PRN available.    DM2  Peripheral neuropathy  Retinopathy  A1c in march was 9.8  Patient with BG normal with sliding scale alone despite home long acting.  Monitor and discharge on appropriate regimen.  Potentially discontinue home insulin, but will follow as renal function improves.  - sliding scale  - follow up A1C  - Gabapentin reduced to 100mg  TID given renal function    PE (saddle embolus) status post IVC filter  supratherapeutic INR  The PE was diagnosed 3 years ago at [facility] with IVC placed with plan for lifelong AC given unprovoked and severity.  Feb V/Q without evidence of PE.  - Plan to initiate hep gtt in conjunction with resumption of coumadin following ophtho procedure. INR goal 2-3    Glaucoma  vitreal hemorrhage  diabetic  retinopathy  Right eye blindness  Transferred to [place] for urgent retinal surgery for elevated IOP, underwent anterior chamber tap with improved IOP at [facility], due to have surgery but held off due to elevated INR.  Procedure performed 10/25/15.  -brimonidine, latanoprost, cosopt  - Follow up recs from retinal team    Constipation - RESOLVED  Last BM 2 days prior admission and required enema, no evidence of obstruction, no prior abdominal surgeries, benign abd exam with bowel sounds.  Resolved with aggressive bowel regimen    Iron deficiency anemia  Hemoglobin at baseline of ~8, no evidence of blood loss, receives iron infusions (last dose 5/18), last iron studies in 08/2015 with ferritin 286 and Fe % saturation 15. Iron studies unrevealing  -monitor CBC    Left foot fracture  Recent Charcot's neuropathic osteoarthropathy left foot versus neuropathic fracture of the navicular bone and fifth metatarsal. Seen by ortho with worsening osteonecrosis on xray.  - Clarify weight bearing/CAM boot instructions from ortho.  -strict NWB  -CAM boot  -PT rec SNF  - Follow up OT  - Follow up ortho outpt    Vulvar ulcerated lesion  Seen by gynecology on 5/16, swab positive for HSV 2, no Rx found in outside records for acyclovir or other antiviral  -consider starting acyclovir or other antiviral if renal function stable  -follow up with gynecology    FEN: renal/diabetic diet  DVT Prophylaxis: hep gtt until INR therapeutic 2-3  Code status: Full  Dispo: SNF when medically stable --> [facility]   need improving Cr and stable INR prior to discharge.    Approximately 40 minutes was spent with the patient, the majority of which was spent reviewing the records, developing a treatment plan, calling consultant services as needed, and counseling the patient and family on the treatment plan, including risks and benefits of the treatment options.    Lennox SoldersEric Othar Curto, MD  Internal  Medicine  Hospitalist  PI: [phone]  Pager: [phone]

## 2015-10-25 NOTE — Nurse Assessment (Addendum)
TRANSFER NOTE - RECEIVING    Note Started: 10/25/2015, 21:40     Report received from EMR. Patient received at 2130 hours from PACU unit by bed. Pt condition stable . patient oriented to room and unit. MD notified of patient's arrival on unit.  Plan of care reviewed and updated. Santa LighterYu Rim Taelon Bendorf RN

## 2015-10-25 NOTE — Nurse Assessment (Signed)
PACU ADMIT NURSING NOTE    Note Started: 10/25/2015, 20:14     Received patient from OR at 2004 hours via gurney.  Monitor and Alarms on.  Patient sleepy but arousable. Tedd SiasMichael Erykah Lippert, RN

## 2015-10-25 NOTE — Plan of Care (Signed)
Problem: Patient Care Overview (Adult)  Goal: Plan of Care Review  Outcome: Ongoing (interventions implemented as appropriate)  Goal Outcome Evaluation Note     Leslie Hernandez is a [age]yr female admitted 10/20/2015      OUTCOME SUMMARY AND PLAN MOVING FORWARD:   Pt slept well thru the night,vss,afebrile,denies any pain or discomfort,Heparin gtt increased x2 by 100 to 1200 units/hr,Coumadin was not given last night as ordered per pharmacy and night float MD.Pt is NPO since MN for OR today,Heparin need to be stopped prior or OR but no time yet.    Problem: Sleep Pattern Disturbance (Adult)  Goal: Adequate Sleep/Rest  Patient will demonstrate the desired outcomes by discharge/transition of care.   Outcome: Ongoing (interventions implemented as appropriate)    Problem: Urine Elimination, Impaired (Adult)  Goal: Effective Urinary Elimination  Patient will demonstrate the desired outcomes by discharge/transition of care.   Outcome: Ongoing (interventions implemented as appropriate)  Goal: Effective Containment of Urine  Patient will demonstrate the desired outcomes by discharge/transition of care.   Outcome: Ongoing (interventions implemented as appropriate)  Goal: Reduced Incontinence Episodes  Patient will demonstrate the desired outcomes by discharge/transition of care.   Outcome: Ongoing (interventions implemented as appropriate)    Problem: Fall Risk (Adult)  Goal: Absence of Falls  Patient will demonstrate the desired outcomes by discharge/transition of care.   Outcome: Ongoing (interventions implemented as appropriate)    Problem: Mobility, Physical Impaired (Adult)  Goal: Enhanced Mobility Skills  Patient will demonstrate the desired outcomes by discharge/transition of care.   Outcome: Ongoing (interventions implemented as appropriate)  Goal: Enhanced Functionality Ability  Patient will demonstrate the desired outcomes by discharge/transition of care.   Outcome: Ongoing (interventions implemented as  appropriate)    Problem: Fluid Volume Excess (Adult,Obstetrics,Pediatric)  Goal: Stable Weight  Patient will demonstrate the desired outcomes by discharge/transition of care.   Outcome: Ongoing (interventions implemented as appropriate)  Goal: Balanced Intake/Output  Patient will demonstrate the desired outcomes by discharge/transition of care.   Outcome: Ongoing (interventions implemented as appropriate)    Problem: Pain, Acute (Adult)  Goal: Acceptable Pain Control/Comfort Level  Patient will demonstrate the desired outcomes by discharge/transition of care.   Outcome: Ongoing (interventions implemented as appropriate)    Problem: Pressure Ulcer Risk (Braden Scale) (Adult,Obstetrics,Pediatric)  Goal: Skin Integrity  Patient will demonstrate the desired outcomes by discharge/transition of care.   Outcome: Ongoing (interventions implemented as appropriate)

## 2015-10-25 NOTE — Nurse Assessment (Signed)
ASSESSMENT NOTE    Note Started: 10/25/2015, 19:33     Initial assessment completed and recorded in EMR.  Report received from day shift nurse and orders reviewed. Plan of Care reviewed and appropriate, discussed with patient.  Santa LighterYu Rim Nakeyia Menden RN

## 2015-10-25 NOTE — OR Nursing (Signed)
OR To Postop Destination    Patient's level of consciousness: drowsy but responsive    Patient transferred to: 15  Transported via: eye bed  Transported by: anesthesia person and circulator  Head of bed: elevated  Oxygen delivered via: face mask  Monitored enroute: yes    Report given to: PACU RN

## 2015-10-25 NOTE — Plan of Care (Signed)
Problem: Perioperative Period (Adult)  Intervention: Prevent/Manage DVT/VTE Risk  Left leg only per anesthesia due to prior blood clot in R leg      Comments:   OR Arrive     Patient's level of consciousness: awake and comfortable     Summary report reviewed: yes     Patient transported to OR via: gurney  Transported by: anesthesia person and circulator  Head of bed: elevated  Oxygen delivered via: N/A  Monitored enroute: yes  Assumed care.
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
## 2015-10-25 NOTE — Nurse Focus (Signed)
Patient escorted off the unit to OR.
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
## 2015-10-25 NOTE — Op Note (Deleted)
Preop diagnosis: nonclearing vitreous hemorrhage with proliferative diabetic retinopathy and ghose cell glaucoma right eye      Postop diagnosis: same     Procedure:25g pars planar vitrectomy, endolaser panretinal laser photocoagulation left eye      Surgeon: Chancy HurterSusanna Freman Lapage, MD PhD  Assistant: Nash DimmerS Wong, MD    Anesthesia: MAC with retrobulbar block    Indication for surgery: For possible visual improvement and to minimize risk of further vision loss and for glaucoma control    Summary of procedure:    After obtaining written consent and reviewing the risk and benefits of surgery, including the risk of retinal detachment, bleeding, infection, loss of vision, loss of eye, etc, the patient's right eye was dilated preoperatively using 1% cyclogyl, 1% mydriacyl and 2.5% phenylephrine. The patient was brought to the OR where after confirming the site and procedure to be done, a retrobulbar block of 2% lidocaine and 0.75% marcaine was administered under intravenous sedation. The operated eye was prepped and drapped in the usual sterile manner. A lid speculum was placed and a 25g trocar was inserted 3.695mm behind the infratemporal limbus. The infusion line was inserted and turned on to form the globe. Two 25g trocars was inserted supranasally and supratemporally, 3.605mm behind the limbus.    Pars planar vitrectomy was performed to remove the cortical vitreous which contained blood. Once complete vitrectomy was completed, active aspiration was performed to confirm a complete removal of vitreous hemorrhage. The retina was noted to be attached throughout with scars from prior panretinal laser photocoagulation. Detailed exam of the peripheral retina did not show any new retinal breaks or detachment. Endolaser was applied to the peripheral retina 360 degrees as supplemental panretinal laser photocoagulation . A total of 1237 spots were applied.      The three trochars were removed and the two superior sclerotomies were found to be  leaking. A limited peritomy was created and the superior sclerotomies were closed with 7-0 vicryl. Overlying peritomy was closed with 7-0 vicyrl.  Dexamethasone and kezol was injected subconjunctival. Intraocular pressure was normal tactile.     Maxitrol and atropine oinments were applied. Lid speculum and drapes were removed and the eye was covered with a dressing and shield.     The patient was sent to the recovery room in stable condition. There were no complications    I was present for the entire procedure.    The information contained on this form is true and accurate to the best of my knowledge.  Further, I understand that if I misrepresent, falsify or conceal information regarding my participation in the professional service described above, I may be subject to fine, imprisonment, or civil penalty under applicable federal laws.    Jlyn Bracamonte S. Aundrea Higginbotham, MD PhD  Vitreo-retinal Specialist  Professor  Attending

## 2015-10-26 LAB — CBC WITH DIFFERENTIAL
BASOPHILS % AUTO: 0.7 %
BASOPHILS ABS AUTO: 0 10*3/uL (ref 0–0.2)
EOSINOPHIL % AUTO: 1.4 %
EOSINOPHIL ABS AUTO: 0.1 10*3/uL (ref 0–0.5)
HEMATOCRIT: 31.2 % — AB (ref 34–46)
HEMOGLOBIN: 9.6 g/dL — AB (ref 12.0–16.0)
LYMPHOCYTE ABS AUTO: 0.8 10*3/uL — AB (ref 1.0–4.8)
LYMPHOCYTES % AUTO: 13.8 %
MCH: 21.7 pg — AB (ref 27–33)
MCHC: 30.8 % — AB (ref 32–36)
MCV: 70.4 UM3 — AB (ref 80–100)
MONOCYTES % AUTO: 3 %
MONOCYTES ABS AUTO: 0.2 10*3/uL (ref 0.1–0.8)
MPV: 6.8 UM3 (ref 6.8–10.0)
NEUTROPHIL ABS AUTO: 4.4 10*3/uL (ref 1.80–7.70)
NEUTROPHILS % AUTO: 81.1 %
PLATELET COUNT: 313 10*3/uL (ref 130–400)
PLATELET ESTIMATE, SMEAR: ADEQUATE
RDW: 18.7 U — AB (ref 0–14.7)
RED CELL COUNT: 4.44 10*6/uL (ref 3.7–5.5)
WHITE BLOOD CELL COUNT: 5.5 10*3/uL (ref 4.5–11.0)

## 2015-10-26 LAB — URINALYSIS AND CULTURE IF IND
BILIRUBIN URINE: NEGATIVE
GLUCOSE URINE: 500 mg/dL
LEUK. ESTERASE: NEGATIVE
NITRITE URINE: NEGATIVE
PH URINE: 7 (ref 4.8–7.8)
SPECIFIC GRAVITY: 1.017 (ref 1.002–1.030)
UROBILINOGEN.: NEGATIVE mg/dL (ref ?–2.0)

## 2015-10-26 LAB — BASIC METABOLIC PANEL
CALCIUM: 8.5 mg/dL — AB (ref 8.6–10.5)
CARBON DIOXIDE TOTAL: 19 meq/L — AB (ref 24–32)
CHLORIDE: 109 meq/L (ref 95–110)
CREATININE BLOOD: 3.47 mg/dL — AB (ref 0.44–1.27)
E-GFR, AFRICAN AMERICAN: 17 SEE NOTE — AB (ref >60)
E-GFR, NON-AFRICAN AMERICAN: 15 — AB (ref >60)
GLUCOSE: 231 mg/dL — AB (ref 70–99)
POTASSIUM: 4.1 meq/L (ref 3.3–5.0)
SODIUM: 140 meq/L (ref 135–145)
UREA NITROGEN, BLOOD (BUN): 40 mg/dL — AB (ref 8–22)

## 2015-10-26 LAB — CREATININE SPOT URINE: CREATININE SPOT URINE: 64.38 mg/dL

## 2015-10-26 LAB — CULTURE BLOOD, BACTI (INCLUDES YEAST)

## 2015-10-26 LAB — PHOSPHORUS (PO4): PHOSPHORUS (PO4): 5 mg/dL (ref 2.4–5.0)

## 2015-10-26 LAB — POTASSIUM SPOT URINE: POTASSIUM SPOT URINE: 23 meq/L

## 2015-10-26 LAB — APTT STUDIES: APTT: 32.5 s (ref 24.1–36.7)

## 2015-10-26 LAB — MAGNESIUM (MG): MAGNESIUM (MG): 1.9 mg/dL (ref 1.5–2.6)

## 2015-10-26 LAB — CHLORIDE SPOT URINE: CHLORIDE SPOT URINE: 83 meq/L

## 2015-10-26 LAB — INR: INR: 1.08 (ref 0.87–1.18)

## 2015-10-26 LAB — SODIUM SPOT URINE: SODIUM SPOT URINE: 90 meq/L

## 2015-10-26 MED ORDER — HYDROMORPHONE 1 MG/ML INJECTION SYRINGE
0.5000 mg | INJECTION | INTRAMUSCULAR | Status: DC | PRN
Start: 2015-10-26 — End: 2015-10-26
  Administered 2015-10-26 (×3): 1 mg via INTRAVENOUS
  Filled 2015-10-26 (×3): qty 1

## 2015-10-26 MED ORDER — WARFARIN 2 MG TABLET
10.0000 mg | ORAL_TABLET | ORAL | Status: AC
Start: 2015-10-26 — End: 2015-10-26
  Administered 2015-10-26: 10 mg via ORAL
  Filled 2015-10-26: qty 5

## 2015-10-26 MED ORDER — HEPARIN (PORCINE) 25,000 UNIT/250 ML (100 UNIT/ML) IN DEXTROSE 5 % IV
100.0000 [IU]/h | INTRAVENOUS | Status: DC
Start: 2015-10-26 — End: 2015-10-30
  Administered 2015-10-26: 1000 [IU]/h via INTRAVENOUS
  Administered 2015-10-27: 1300 [IU]/h via INTRAVENOUS
  Administered 2015-10-28: 1200 [IU]/h via INTRAVENOUS
  Administered 2015-10-30: 1400 [IU]/h via INTRAVENOUS
  Filled 2015-10-26 (×5): qty 1

## 2015-10-26 MED ORDER — HYDROMORPHONE 1 MG/ML INJECTION SYRINGE
1.0000 mg | INJECTION | INTRAMUSCULAR | Status: DC | PRN
Start: 2015-10-26 — End: 2015-11-06
  Administered 2015-10-26 – 2015-11-06 (×20): 1 mg via INTRAVENOUS
  Filled 2015-10-26 (×21): qty 1

## 2015-10-26 MED ORDER — HYDROMORPHONE 2 MG TABLET
2.0000 mg | ORAL_TABLET | ORAL | Status: DC | PRN
Start: 2015-10-26 — End: 2015-11-18
  Administered 2015-10-27 – 2015-11-12 (×34): 2 mg via ORAL
  Filled 2015-10-26 (×34): qty 1

## 2015-10-26 MED ORDER — PROMETHAZINE 25 MG/ML INJECTION SOLUTION
12.5000 mg | Freq: Four times a day (QID) | INTRAMUSCULAR | Status: DC | PRN
Start: 2015-10-26 — End: 2015-11-18
  Administered 2015-10-30 – 2015-11-15 (×5): 12.5 mg via INTRAVENOUS
  Filled 2015-10-26 (×5): qty 0.5

## 2015-10-26 NOTE — Progress Notes (Addendum)
INTERNAL MEDICINE DAILY PROGRESS NOTE  Date: 10/26/2015  Time:  16:56      INTERVAL HISTORY:  No overnight events.  - S/P optho procedure yesterday evening (pars planar vitrectomy)  - hep gtt resumed, resumed coumadin    SUBJECTIVE:  Patient with severe pain.  Being managed with narcotics (increasing).  Patient with ongoing back pain.  Improved with lidocaine patch.  Patient also emesis this am, presumed due to severe pain.    MEDICATIONS:  Scheduled Medications  Amlodipine (NORVASC) Tablet 10 mg, ORAL, QAM  Atorvastatin (LIPITOR) Tablet 80 mg, ORAL, Daily Bedtime  Brimonidine (ALPHAGAN) 0.2 % Ophthalmic Solution 1 drop, BOTH Eyes, TID  Docusate (COLACE) Capsule 100 mg, ORAL, BID  Dorzolamide 2%/Timolol 0.5% (COSOPT) Ophthalmic Solution 1 drop, BOTH Eyes, BID  Gabapentin (NEURONTIN) Capsule 100 mg, ORAL, TID  HydrALAZINE (APRESOLINE) Tablet 100 mg, ORAL, TID  Insulin Aspart (NOVOLOG) Injection Pen 1-4 Units, SUBCUTANEOUS, Daily Bedtime  Insulin Aspart (NOVOLOG) Injection Pen 1-6 Units, SUBCUTANEOUS, TID w/ meals - correction dose  Latanoprost (XALATAN) 0.005 % Ophthalmic Solution 1 drop, BOTH Eyes, Daily Bedtime  Lidocaine (LIDODERM) 5 %(700 mg/patch) Patch 2 patch, Transdermal, Q24H Now  Lidocaine patch REMOVAL 1 patch, Transdermal, Q24H Now  Polyethylene Glycol 3350 (MIRALAX) Oral Powder Packet 17 g, ORAL, QAM  Sennosides (SENOKOT) Tablet 17.2 mg, ORAL, Daily Bedtime  Warfarin Patient Flag, NOT APPLICABLE, PATIENT FLAG ONLY    IV Medications  Heparin, 100-1,500 Units/hr, IV, CONTINUOUS, Last Rate: 1,000 Units/hr (10/26/15 1617)    PRN Medications  Acetaminophen (TYLENOL) Tablet 650 mg, ORAL, Q4H PRN  Bisacodyl (DULCOLAX) Suppository 10 mg, RECTALLY, Q24H PRN  Dextrose 50% Injection 12.5 g, IV, PRN  Dextrose 50% Injection 25 g, IV, PRN  Glucagon Injection 1 mg, IM, PRN  HydrALAZINE (APRESOLINE) Injection 10 mg, IV, Q4H PRN  Hydromorphone (DILAUDID) Injection 1-1.5 mg, IV, Q3H PRN  Hydromorphone (DILAUDID) Tablet 2  mg, ORAL, Q4H PRN  Lactulose (CHRONULAC) 10 gram/15 mL Solution 30 mL, ORAL, Q6H PRN  Mineral Oil (FLEET) Enema 1 enema, RECTALLY, Bedtime PRN  Ondansetron (ZOFRAN) Injection 4 mg, IV, Q8H PRN  Promethazine (PHENERGAN) Injection 12.5 mg, IV, Q6H PRN        OBJECTIVE:   Vital Signs  Summary  Temp Min: 36.4 C (97.5 F) Max: 36.7 C (98.1 F)  BP: (134-172)/(80-100)   Pulse Min: 67 Max: 90  Resp Min: 15 Max: 19  SpO2 Min: 95 % Max: 100 %      Current Vitals  Temp: 36.7 C (98.1 F)  BP: 172/84  Pulse: 78  Resp: 16  SpO2: 100 %      Weight: 112.6 kg (248 lb 3.8 oz)     Intake and Output  Last Two Completed Shifts  In: 1170 [Oral:600; Crystalloid:570]  Out: 136 [Urine:1; Emesis:130; Other:5]    Current Shift  In: 340 [Oral:340]  Out: -       Physical Exam  General: obese female, No acute distress, conversational. A+Ox3  HEENT:  Moist oral mucosa.  Right occluded with bandages c/d/i  CV: Regular rate and rhythm, nl s1s2, no murmurs, rubs or gallops. No JVD.  Pulm: CTAB  good symmetric air movement  Abd: Soft NT ND, +BS. No rebound or guarding  Skin: No rashes. No jaundice.  Neuro: face symmetric, moving all 4 extremities spontaneously.  Left foot with TTP along lateral aspect.  Extrem:  trace LE edema.      LINES AND DRAINS:   piv    LAB TESTS/STUDIES:  I personally reviewed the following report(s)  .    Lab Results - 24 hours (excluding micro and POC)   APTT STUDIES     Status: None   Result Value Status    APTT 31.4 Final   CHLORIDE SPOT URINE     Status: None   Result Value Status    CHLORIDE SPOT URINE 83 Final   POTASSIUM SPOT URINE     Status: None   Result Value Status    POTASSIUM SPOT URINE 23.0 Final   SODIUM SPOT URINE     Status: None   Result Value Status    SODIUM SPOT URINE 90 Final   CREATININE SPOT URINE     Status: None   Result Value Status    CREATININE SPOT URINE 64.38 Final   BASIC METABOLIC PANEL     Status: Abnormal   Result Value Status    SODIUM 140 Final    POTASSIUM 4.1 Final     CHLORIDE 109 Final    CARBON DIOXIDE TOTAL 19 (L) Final    UREA NITROGEN, BLOOD (BUN) 40 (H) Final    CREATININE BLOOD 3.47 (H) Final    E-GFR, AFRICAN AMERICAN 17 (L) Final    E-GFR, NON-AFRICAN AMERICAN 15 (L) Final    GLUCOSE 231 (H) Final    CALCIUM 8.5 (L) Final   MAGNESIUM (MG)     Status: None   Result Value Status    MAGNESIUM (MG) 1.9 Final   PHOSPHORUS (PO4)     Status: None   Result Value Status    PHOSPHORUS (PO4) 5.0 Final   INR     Status: None   Result Value Status    INR 1.08 Final   CBC WITH DIFFERENTIAL     Status: Abnormal   Result Value Status    WHITE BLOOD CELL COUNT 5.5 Final    RED CELL COUNT 4.44 Final    HEMOGLOBIN 9.6 (L) Final    HEMATOCRIT 31.2 (L) Final    MCV 70.4 (L) Final    MCH 21.7 (L) Final    MCHC 30.8 (L) Final    RDW 18.7 (H) Final    MPV 6.8 Final    PLATELET COUNT 313 Final    NEUTROPHILS % AUTO 81.1 Final    LYMPHOCYTES % AUTO 13.8 Final    MONOCYTES % AUTO 3.0 Final    EOSINOPHIL % AUTO 1.4 Final    BASOPHILS % AUTO 0.7 Final    NEUTROPHIL ABS AUTO 4.40 Final    LYMPHOCYTE ABS AUTO 0.8 (L) Final    MONOCYTES ABS AUTO 0.2 Final    EOSINOPHIL ABS AUTO 0.1 Final    BASOPHILS ABS AUTO 0 Final    ANISOCYTOSIS MOD (Abnl) Final    POIKILOCYTOSIS SL Final    POLYCHROMASIA SL Final    HYPOCHROMIA SL Final    OVALOCYTES SL Final    LEFT SHIFT NOT PRESENT Final    BURR CELLS SL Final    PLATELET ESTIMATE, SMEAR Adequate Final   APTT STUDIES     Status: None   Result Value Status    APTT 32.5 Final     INFLUENZA A Negative  Negative  Final     INFLUENZA B Negative  Negative  Final    RESPIRATORY SYNCYTIAL VIRUS Negative  Negative  Final    PARAINFLUENZA Negative  Negative  Final    CORONAVIRUS Negative  Negative  Final    HUMAN METAPNEUMOVIRUS Negative  Negative  Final  RHINOVIRUS/ENTEROVIRUS POSITIVE (Abnl) Negative  Final    ADENOVIRUS Negative  Negative  Final    HUMAN BOCAVIRUS Negative  Negative  Final    CHLAMYDOPHILA PNEUMONIAE Negative  Negative  Final    MYCOPLASMA  PNEUMONIAE Negative  Negative  Final    Comment:         NEW IMAGES:  Xray left foot:  IMPRESSION:    Flattening and fragmentation of the navicular bone, compatible with  subacute fracture. Regions of sclerosis within the navicular bone likely  reflect osteonecrosis.    Old healed fracture of the proximal fifth metatarsal.    ASSESSMENT & PLAN:    50 year old female past medical history of DM-2, CKD-4, PE on warfarin, dCHF, HTN with CHF exacerbation and subacute vitreal hemorrhage requiring ophtho retinal surgery which occurred but delayed due to supratherapeutic INR when admitted.      Glaucoma  vitreal hemorrhage  diabetic retinopathy  Right eye blindness  Transferred to [place] for urgent retinal surgery for elevated IOP, underwent anterior chamber tap with improved IOP at [facility], due to have surgery but held off due to elevated INR.  Procedure performed 10/25/15.  -brimonidine, latanoprost, cosopt  - Follow up recs from retinal team    AKI on CKD  Patient with baseline Cr 2.3.  Presented with Cr 3.5 rose to 3.7 with diureisis, improving slowly with mild hydration.    - renally dose medications and avoid nephrotoxins  - STOP home po lasix as euvolemic/dry on exam  - Monitor Cr off diuretics.    - FeUrea    Acute on chronic heart failure, diastolic  Pericardial effusion  Patient with preserved EF. Presented in decompensated heart failure with volume overload requiring iv lasix.  Also with small pericardial effusion likely due to volume overload.  - Patient with Cr elevated, but no longer appears volume overloaded and Cr was rising with diuresis.  Re-initiated home oral medication  - STOP lasix 40mg  po daily, monitor renal function.  - Fluid restriction, daily weight    Cough  rhinorhea  Patient with rhinovirus.   Patient given vanc/cefepime in ED, but no evidence pneumonia and with viral URI.  Monitor off abx.  - supportive care    HTN  Hypertensive on admission, likely related to volume overload.  Monitor with  diuresis.  - cont home amlodipine, hydralazine.  Have hydralazine iv PRN available.    DM2  Peripheral neuropathy  Retinopathy  A1c in march was 9.8  Patient with BG normal with sliding scale alone despite home long acting.  Monitor and discharge on appropriate regimen.  Potentially discontinue home insulin, but will follow as renal function improves.  - sliding scale  - follow up A1C  - Gabapentin reduced to 100mg  TID given renal function    PE (saddle embolus) status post IVC filter  supratherapeutic INR  The PE was diagnosed 3 years ago at [facility] with IVC placed with plan for lifelong AC given unprovoked and severity.  Feb V/Q without evidence of PE.  -Hep gtt in conjunction with resumption of coumadin following ophtho procedure. INR goal 2-3.  Patient recorded as taking coumadin 20mg , but also admitted supratherapeutic  - COUMADIN 10MG  TODAY X1.  Redose daily    Constipation - RESOLVED  Last BM 2 days prior admission and required enema, no evidence of obstruction, no prior abdominal surgeries, benign abd exam with bowel sounds.  Resolved with aggressive bowel regimen    Iron deficiency anemia  Hemoglobin at  baseline of ~8, no evidence of blood loss, receives iron infusions (last dose 5/18), last iron studies in 08/2015 with ferritin 286 and Fe % saturation 15. Iron studies unrevealing  -monitor CBC    Left foot fracture  Recent Charcot's neuropathic osteoarthropathy left foot versus neuropathic fracture of the navicular bone and fifth metatarsal. Seen by ortho with worsening osteonecrosis on xray.  -Patient should be strict NWB  -CAM boot  -PT rec SNF  - Patient will need follow up ortho outpt ~1wk following discharge.    Vulvar ulcerated lesion  Seen by gynecology on 5/16, swab positive for HSV 2, no Rx found in outside records for acyclovir or other antiviral  -consider starting acyclovir or other antiviral if renal function stable  -follow up with gynecology    FEN: renal/diabetic diet  DVT  Prophylaxis: hep gtt until INR therapeutic 2-3 given unprovoked saddle pe  Code status: Full  Dispo: SNF when medically stable --> patient expressed dissatisfication with [facility] (but appears may be only option)  need improving Cr and stable INR prior to discharge.  (By time of discharge won't need isolation for viral URI...)    Approximately 40 minutes was spent with the patient, the majority of which was spent reviewing the records, developing a treatment plan, calling consultant services as needed, and counseling the patient and family on the treatment plan, including risks and benefits of the treatment options.    Lennox Solders, MD  Internal Medicine  Hospitalist  PI: [id number]  Pager: [phone]

## 2015-10-26 NOTE — Plan of Care (Signed)
Problem: Patient Care Overview (Adult)  Goal: Plan of Care Review  Outcome: Ongoing (interventions implemented as appropriate)  Goal Outcome Evaluation Note     Leslie Hernandez is a 42yrfemale admitted 10/20/2015      OUTCOME SUMMARY AND PLAN MOVING FORWARD:   Pt is AOx4, VSS except for HTN. Denies CP or SOB. Lung sounds are diminished at the bases. Lasix dc'd by MD. POD1 and eye patch is cdi. Pt frequently wakes up crying in 8-10/10 pain in r eye. Prn IV dilaudid added to pain regimen. Emesis x2, iv zofran given with some relief. Incontinent care provided x1. Still need urine sample. Will continue to monitor pt.   Goal: Individualization and Mutuality  Outcome: Ongoing (interventions implemented as appropriate)  Goal: Discharge Needs Assessment  Outcome: Ongoing (interventions implemented as appropriate)    Problem: Sleep Pattern Disturbance (Adult)  Goal: Adequate Sleep/Rest  Patient will demonstrate the desired outcomes by discharge/transition of care.   Outcome: Ongoing (interventions implemented as appropriate)    Problem: Urine Elimination, Impaired (Adult)  Goal: Effective Urinary Elimination  Patient will demonstrate the desired outcomes by discharge/transition of care.   Outcome: Unable to achieve outcome(s) by discharge Date Met:  10/26/15  Goal: Effective Containment of Urine  Patient will demonstrate the desired outcomes by discharge/transition of care.   Outcome: Ongoing (interventions implemented as appropriate)  Goal: Reduced Incontinence Episodes  Patient will demonstrate the desired outcomes by discharge/transition of care.   Outcome: Ongoing (interventions implemented as appropriate)    Problem: Fall Risk (Adult)  Goal: Absence of Falls  Patient will demonstrate the desired outcomes by discharge/transition of care.   Outcome: Ongoing (interventions implemented as appropriate)    Problem: Mobility, Physical Impaired (Adult)  Goal: Enhanced Mobility Skills  Patient will demonstrate the desired outcomes  by discharge/transition of care.   Outcome: Ongoing (interventions implemented as appropriate)  Goal: Enhanced Functionality Ability  Patient will demonstrate the desired outcomes by discharge/transition of care.   Outcome: Ongoing (interventions implemented as appropriate)    Problem: Fluid Volume Excess (Adult,Obstetrics,Pediatric)  Goal: Stable Weight  Patient will demonstrate the desired outcomes by discharge/transition of care.   Outcome: Ongoing (interventions implemented as appropriate)    Problem: Pressure Ulcer Risk (Braden Scale) (Adult,Obstetrics,Pediatric)  Goal: Skin Integrity  Patient will demonstrate the desired outcomes by discharge/transition of care.   Outcome: Ongoing (interventions implemented as appropriate)    Problem: Perioperative Period (Adult)  Goal: Signs and Symptoms of Listed Potential Problems Will be Absent or Manageable (Perioperative Period)  Signs and symptoms of listed potential problems will be absent or manageable by discharge/transition of care (reference Perioperative Period (Adult) CPG).   Outcome: Ongoing (interventions implemented as appropriate)

## 2015-10-26 NOTE — Progress Notes (Signed)
Ophthalmology Retina Progress Note    S: Patient notes some eye pain that is relieved with pain medications. Notes stable vision.    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 Start atropine gtt OD daily  2. Start Maxitrol gtt OD QID  3. Re-start brimonidine OD TID; hold latanoprost and dorzolamide/timolol OD    Will reassess in 1 week. Patient seen with Dr. Willaim BanePark.    Quin HoopSophia Munirah Doerner, MD  Fellow, Vitreoretinal Surgery  Department of Ophthalmology and Vision Science

## 2015-10-26 NOTE — Nurse Assessment (Addendum)
ASSESSMENT NOTE    Note Started: 10/26/2015, 19:20     Initial assessment completed and recorded in EMR.  Report received from day shift nurse and orders reviewed. Plan of Care reviewed and appropriate, discussed with patient. Pt is calm and cooperative, AOx4, resting in bed. Plan for shift is safety and rest. Salena Sanereok Shayne Deerman, RN RN

## 2015-10-26 NOTE — Allied Health Progress (Signed)
NUTRITION SCREENING     Admission Date: 10/20/2015   Date of Service: 10/26/2015, 15:44     Admission Summary: 50 yr old woman with hx of DM2 with proliferative diabetic retinopathy in both eyes, CKD4, PE, dCHF, HTN, presenting with dyspnea, s/p planar vitrectomy, endolaser panretinal laser photocoagulation left eye (10/25/2015).    Diet Order: Renal, Carbohydrate controlled (45-60g carb/meal) diet    Patient consumed 100% of meals when not NPO over the past 3 days, which is adequate to meet estimated nutrition needs. NKFA. Weight relatively stable. Patient with an episode of emesis (100 mL, yellow) this morning and loose stool; for this reason, she refused to eat breakfast and lunch in fear of diarrhea but has kept down fluids. RN and RD discussed the importance of still eating food. Patient willing to try drink oral supplement.    Patient does not present at acute nutrition risk. No further nutrition intervention is warranted at this time. Will continue routine nutrition monitoring. Please consult Registered Dietitian if acute nutrition issues arise.    Report Electronically Signed By: Janey Gentaharlotte Miyoshi Ligas, MS, RD, Pager: [phone] (Covering for: Shawna ClampErica Trimble, RD; Pager [phone])

## 2015-10-26 NOTE — Nurse Assessment (Signed)
ASSESSMENT NOTE    Note Started: 10/26/2015, 09:09     Initial assessment completed and recorded in EMR.  Report received from night shift nurse and orders reviewed. Patient reports intermittent nausea and vomiting and refused breakfast. R eye patch/dressing intact, but pt reports pain 9/10, now getting iv dilaudid. Plan of Care reviewed and appropriate, discussed with patient.  Claudette Headuth Almedia Cordell, RN.

## 2015-10-27 DIAGNOSIS — H5441 Blindness, right eye, normal vision left eye: Secondary | ICD-10-CM

## 2015-10-27 LAB — CBC WITH DIFFERENTIAL
Basophils % Auto: 0.4 %
Basophils Abs Auto: 0 K/MM3 (ref 0–0.2)
Eosinophils % Auto: 3.3 %
Eosinophils Abs Auto: 0.1 K/MM3 (ref 0–0.5)
Hematocrit: 34.3 % (ref 34–46)
Hemoglobin: 10.6 g/dL — ABNORMAL LOW (ref 12.0–16.0)
Lymphocytes % Auto: 27.8 %
Lymphocytes Abs Auto: 1.1 K/MM3 (ref 1.0–4.8)
MCH: 21.8 pg — ABNORMAL LOW (ref 27–33)
MCHC: 31 % — ABNORMAL LOW (ref 32–36)
MCV: 70.3 UM3 — ABNORMAL LOW (ref 80–100)
MPV: 7.2 UM3 (ref 6.8–10.0)
Monocytes % Auto: 7.8 %
Monocytes Abs Auto: 0.3 K/MM3 (ref 0.1–0.8)
Neutrophils % Auto: 60.7 %
Neutrophils Abs Auto: 2.5 K/MM3 (ref 1.80–7.70)
Platelet Count: 256 K/MM3 (ref 130–400)
Platelet Estimate, Smear: ADEQUATE
RDW: 18.2 U — ABNORMAL HIGH (ref 0–14.7)
Red Blood Cell Count: 4.88 M/MM3 (ref 3.7–5.5)
White Blood Cell Count: 4.1 K/MM3 — ABNORMAL LOW (ref 4.5–11.0)

## 2015-10-27 LAB — BASIC METABOLIC PANEL
CALCIUM: 8.1 mg/dL — AB (ref 8.6–10.5)
CARBON DIOXIDE TOTAL: 23 meq/L — AB (ref 24–32)
CHLORIDE: 112 meq/L — AB (ref 95–110)
CREATININE BLOOD: 3.56 mg/dL — AB (ref 0.44–1.27)
E-GFR, AFRICAN AMERICAN: 17 SEE NOTE — AB (ref >60)
E-GFR, NON-AFRICAN AMERICAN: 14 — AB (ref >60)
GLUCOSE: 169 mg/dL — AB (ref 70–99)
POTASSIUM: 3.8 meq/L (ref 3.3–5.0)
SODIUM: 141 meq/L (ref 135–145)
UREA NITROGEN, BLOOD (BUN): 38 mg/dL — AB (ref 8–22)

## 2015-10-27 LAB — PHOSPHORUS (PO4): PHOSPHORUS (PO4): 4.6 mg/dL (ref 2.4–5.0)

## 2015-10-27 LAB — APTT STUDIES
APTT: 21.5 s — AB (ref 24.1–36.7)
APTT: 41.5 s — AB (ref 24.1–36.7)
aPTT: 38.5 s — ABNORMAL HIGH (ref 24.1–36.7)

## 2015-10-27 LAB — MAGNESIUM (MG): MAGNESIUM (MG): 2 mg/dL (ref 1.5–2.6)

## 2015-10-27 LAB — INR: INR: 1.17 (ref 0.87–1.18)

## 2015-10-27 MED ORDER — WARFARIN 2 MG TABLET
10.0000 mg | ORAL_TABLET | ORAL | Status: AC
Start: 2015-10-27 — End: 2015-10-27
  Administered 2015-10-27: 10 mg via ORAL
  Filled 2015-10-27: qty 5

## 2015-10-27 NOTE — Plan of Care (Addendum)
Problem: Patient Care Overview (Adult)  Goal: Plan of Care Review  Outcome: Ongoing (interventions implemented as appropriate)  Goal Outcome Evaluation Note     Leslie Hernandez is a [age]yr female admitted 10/20/2015      OUTCOME SUMMARY AND PLAN MOVING FORWARD:   Pt is calm and oriented, BP = 160/87 but other VS WDL, pain is managed with Dilauded during shift. Slept ~5-6 hours during shift. R eye patch dry and intact. Heparin continuous drip is continued, no signs of bleeding. Will continue to monitor for any acute changes.     Problem: Sleep Pattern Disturbance (Adult)  Goal: Adequate Sleep/Rest  Patient will demonstrate the desired outcomes by discharge/transition of care.   Outcome: Ongoing (interventions implemented as appropriate)    Problem: Urine Elimination, Impaired (Adult)  Goal: Effective Containment of Urine  Patient will demonstrate the desired outcomes by discharge/transition of care.   Outcome: Ongoing (interventions implemented as appropriate)    Problem: Fall Risk (Adult)  Goal: Absence of Falls  Patient will demonstrate the desired outcomes by discharge/transition of care.   Outcome: Ongoing (interventions implemented as appropriate)    Problem: Fluid Volume Excess (Adult,Obstetrics,Pediatric)  Goal: Stable Weight  Patient will demonstrate the desired outcomes by discharge/transition of care.   Outcome: Ongoing (interventions implemented as appropriate)    Problem: Pain, Acute (Adult)  Goal: Acceptable Pain Control/Comfort Level  Patient will demonstrate the desired outcomes by discharge/transition of care.   Outcome: Ongoing (interventions implemented as appropriate)    Problem: Perioperative Period (Adult)  Goal: Signs and Symptoms of Listed Potential Problems Will be Absent or Manageable (Perioperative Period)  Signs and symptoms of listed potential problems will be absent or manageable by discharge/transition of care (reference Perioperative Period (Adult) CPG).   Outcome: Ongoing (interventions  implemented as appropriate)

## 2015-10-27 NOTE — Nurse Focus (Addendum)
Pt refused any more lab draw after Rapid RN attempted to draw aPTT for Heparin gtt. Notified Dr. Loel DubonnetFan, order received to keep pt at 1300 units/hr until morning until pt is agreeable to obtain additional labs. Will continue to monitor.

## 2015-10-27 NOTE — Nurse Assessment (Signed)
ASSESSMENT NOTE    Note Started: 10/27/2015, 20:25     Initial assessment completed and recorded in EMR.  Report received from day shift nurse and orders reviewed. Plan of Care reviewed and appropriate, discussed with patient. Pt vss, calm and oriented, on Heparin Drip running 1300 units/hr, no signs of bleeding/distress, will continue to monitor. Plan for tonight includes safety, comfort, and rest. Salena Sanereok Launa Goedken, RN RN

## 2015-10-27 NOTE — Plan of Care (Signed)
Problem: Patient Care Overview (Adult)  Goal: Plan of Care Review  Outcome: Ongoing (interventions implemented as appropriate)  Goal Outcome Evaluation Note     Leslie Hernandez is a [age]yr female admitted 10/20/2015      OUTCOME SUMMARY AND PLAN MOVING FORWARD:   No acute distress during the shift, pain on right eye & chronic back pain have been managed with dilaudid oral & IV. Pt was noted being moderately steady with stand by assist in mobility. Continue on heparin infusion in monitoring APTT. Coumadin 10mg  PO given in conjunction.    Problem: Sleep Pattern Disturbance (Adult)  Goal: Adequate Sleep/Rest  Patient will demonstrate the desired outcomes by discharge/transition of care.   Outcome: Ongoing (interventions implemented as appropriate)    Problem: Urine Elimination, Impaired (Adult)  Goal: Effective Containment of Urine  Patient will demonstrate the desired outcomes by discharge/transition of care.   Outcome: Ongoing (interventions implemented as appropriate)  Goal: Reduced Incontinence Episodes  Patient will demonstrate the desired outcomes by discharge/transition of care.   Outcome: Ongoing (interventions implemented as appropriate)    Problem: Fall Risk (Adult)  Goal: Absence of Falls  Patient will demonstrate the desired outcomes by discharge/transition of care.   Outcome: Ongoing (interventions implemented as appropriate)    Problem: Mobility, Physical Impaired (Adult)  Goal: Enhanced Mobility Skills  Patient will demonstrate the desired outcomes by discharge/transition of care.   Outcome: Ongoing (interventions implemented as appropriate)  Goal: Enhanced Functionality Ability  Patient will demonstrate the desired outcomes by discharge/transition of care.   Outcome: Ongoing (interventions implemented as appropriate)    Problem: Fluid Volume Excess (Adult,Obstetrics,Pediatric)  Goal: Stable Weight  Patient will demonstrate the desired outcomes by discharge/transition of care.   Outcome: Ongoing  (interventions implemented as appropriate)  Goal: Balanced Intake/Output  Patient will demonstrate the desired outcomes by discharge/transition of care.   Outcome: Ongoing (interventions implemented as appropriate)    Problem: Pain, Acute (Adult)  Goal: Acceptable Pain Control/Comfort Level  Patient will demonstrate the desired outcomes by discharge/transition of care.   Outcome: Ongoing (interventions implemented as appropriate)    Problem: Pressure Ulcer Risk (Braden Scale) (Adult,Obstetrics,Pediatric)  Goal: Skin Integrity  Patient will demonstrate the desired outcomes by discharge/transition of care.   Outcome: Ongoing (interventions implemented as appropriate)

## 2015-10-27 NOTE — Nurse Assessment (Signed)
ASSESSMENT NOTE    Note Started: 10/27/2015, 09:02     Initial assessment completed and recorded in EMR.  Report received from night shift nurse and orders reviewed. Patient reports left eye pain as well as HA rating 8/10 before taking dilaudid 2 mg orally, pt reports she has a total blindness to right eye & blurry vision to left eye.The plan for the day is protecting right eye with eye shield, pain control, continue on heparin infusion along with APTT monitor, and safety as pt has poor visions Plan of Care reviewed and appropriate, discussed with patient.   Francine GravenEun Jeong Carrina Schoenberger, RN

## 2015-10-27 NOTE — Nurse Consult (Signed)
10/27/2015  23:25    Action Team Note    Multiple attempts to obtain IV access have been made by both the bedside RN and the Action Team without success.  In the interest of patient safety and uninterruption of treatment, primary RN has been notified and asked to contact primary team and recommended to pursue advanced IV access or medication route changes.  The Action team will not be pursuing further peripheral IV access at this time.      Patient and bedside RN updated about plan of care.    Maisie Fusony Trenda Corliss, RN

## 2015-10-27 NOTE — Progress Notes (Signed)
INTERNAL MEDICINE DAILY PROGRESS NOTE  Date: 10/27/2015  Time:  15:55      INTERVAL HISTORY:    No overnight events.    SUBJECTIVE:    Pain persists in the right eye    MEDICATIONS:  Scheduled Medications  Amlodipine (NORVASC) Tablet 10 mg, ORAL, QAM  Atorvastatin (LIPITOR) Tablet 80 mg, ORAL, Daily Bedtime  Brimonidine (ALPHAGAN) 0.2 % Ophthalmic Solution 1 drop, BOTH Eyes, TID  Docusate (COLACE) Capsule 100 mg, ORAL, BID  Dorzolamide 2%/Timolol 0.5% (COSOPT) Ophthalmic Solution 1 drop, BOTH Eyes, BID  Gabapentin (NEURONTIN) Capsule 100 mg, ORAL, TID  HydrALAZINE (APRESOLINE) Tablet 100 mg, ORAL, TID  Insulin Aspart (NOVOLOG) Injection Pen 1-4 Units, SUBCUTANEOUS, Daily Bedtime  Insulin Aspart (NOVOLOG) Injection Pen 1-6 Units, SUBCUTANEOUS, TID w/ meals - correction dose  Latanoprost (XALATAN) 0.005 % Ophthalmic Solution 1 drop, BOTH Eyes, Daily Bedtime  Lidocaine (LIDODERM) 5 %(700 mg/patch) Patch 2 patch, Transdermal, Q24H Now  Lidocaine patch REMOVAL 1 patch, Transdermal, Q24H Now  Polyethylene Glycol 3350 (MIRALAX) Oral Powder Packet 17 g, ORAL, QAM  Sennosides (SENOKOT) Tablet 17.2 mg, ORAL, Daily Bedtime  Warfarin Patient Flag, NOT APPLICABLE, PATIENT FLAG ONLY    IV Medications  Heparin, 100-1,500 Units/hr, IV, CONTINUOUS, Last Rate: 1,300 Units/hr (10/27/15 1547)    PRN Medications  Acetaminophen (TYLENOL) Tablet 650 mg, ORAL, Q4H PRN  Bisacodyl (DULCOLAX) Suppository 10 mg, RECTALLY, Q24H PRN  Dextrose 50% Injection 12.5 g, IV, PRN  Dextrose 50% Injection 25 g, IV, PRN  Glucagon Injection 1 mg, IM, PRN  HydrALAZINE (APRESOLINE) Injection 10 mg, IV, Q4H PRN  Hydromorphone (DILAUDID) Injection 1-1.5 mg, IV, Q3H PRN  Hydromorphone (DILAUDID) Tablet 2 mg, ORAL, Q4H PRN  Lactulose (CHRONULAC) 10 gram/15 mL Solution 30 mL, ORAL, Q6H PRN  Mineral Oil (FLEET) Enema 1 enema, RECTALLY, Bedtime PRN  Ondansetron (ZOFRAN) Injection 4 mg, IV, Q8H PRN  Promethazine (PHENERGAN) Injection 12.5 mg, IV, Q6H  PRN        OBJECTIVE:   Vital Signs  Summary  Temp Min: 36.8 C (98.2 F) Max: 37.2 C (99 F)  BP: (128-173)/(80-87)   Pulse Min: 86 Max: 101  Resp Min: 16 Max: 18  SpO2 Min: 98 % Max: 100 %      Current Vitals  Temp: 36.8 C (98.2 F)  BP: 148/84  Pulse: 89  Resp: 18  SpO2: 99 %      Weight: 112.6 kg (248 lb 3.8 oz)     Intake and Output  Last Two Completed Shifts  In: 740 [Oral:740]  Out: 450 [Urine:450]    Current Shift  In: 480 [Oral:480]  Out: 1050 [Urine:1050]      Physical Exam  General: obese female, No acute distress, conversational. A+Ox3  HEENT:  Moist oral mucosa.  Right occluded with bandages c/d/i  CV: Regular rate and rhythm, nl s1s2, no murmurs, rubs or gallops. No JVD.  Pulm: CTAB  good symmetric air movement  Abd: Soft NT ND, +BS. No rebound or guarding  Skin: No rashes. No jaundice.  Neuro: face symmetric, moving all 4 extremities spontaneously.  Left foot with TTP along lateral aspect.  Extrem:  1+ LE edema bilaterally.    LINES AND DRAINS:   piv    LAB TESTS/STUDIES:     I personally reviewed the following report(s)  .    Lab Results - 24 hours (excluding micro and POC)   URINALYSIS AND CULTURE IF IND     Status: Abnormal   Result Value Status  COLLECTION Clean Catch Final    COLOR Yellow Final    CLARITY TURBID (Abnl) Final    SPECIFIC GRAVITY 1.017 Final    pH URINE 7.0 Final    OCCULT BLOOD URINE SMALL (Abnl) Final    BILIRUBIN URINE Negative Final    KETONES Trace (Abnl) Final    GLUCOSE URINE 500 Final    PROTEIN URINE >=500 (Abnl) Final    UROBILINOGEN. Negative Final    NITRITE URINE Negative Final    LEUK. ESTERASE Negative Final    MICROSCOPIC INDICATED Final    WBC 0-5 Final    RBC 6-25 (H) Final    BACTERIA/HPF MANY (Abnl) Final    SQUAMOUS EPI 26-100 (H) Final    MUCOUS/LPF Few Final    URINE CULTURE INDICATED (Abnl) Final   CULTURE URINE, BACTI     Status: None (Preliminary result)   Result Value Status    CULTURE URINE  Preliminary       CULTURE URINE  Preliminary                                                                      NO GROWTH AFTER 1 DAY                                       APTT STUDIES     Status: Abnormal   Result Value Status    APTT 21.5 (L) Final   BASIC METABOLIC PANEL     Status: Abnormal   Result Value Status    SODIUM 141 Final    POTASSIUM 3.8 Final    CHLORIDE 112 (H) Final    CARBON DIOXIDE TOTAL 23 (L) Final    UREA NITROGEN, BLOOD (BUN) 38 (H) Final    CREATININE BLOOD 3.56 (H) Final    E-GFR, AFRICAN AMERICAN 17 (L) Final    E-GFR, NON-AFRICAN AMERICAN 14 (L) Final    GLUCOSE 169 (H) Final    CALCIUM 8.1 (L) Final   MAGNESIUM (MG)     Status: None   Result Value Status    MAGNESIUM (MG) 2.0 Final   PHOSPHORUS (PO4)     Status: None   Result Value Status    PHOSPHORUS (PO4) 4.6 Final   CBC WITH DIFFERENTIAL     Status: Abnormal   Result Value Status    WHITE BLOOD CELL COUNT 4.1 (L) Final    RED CELL COUNT 4.88 Final    HEMOGLOBIN 10.6 (L) Final    HEMATOCRIT 34.3 Final    MCV 70.3 (L) Final    MCH 21.8 (L) Final    MCHC 31.0 (L) Final    RDW 18.2 (H) Final    MPV 7.2 Final    PLATELET COUNT 256 Final    NEUTROPHILS % AUTO 60.7 Final    LYMPHOCYTES % AUTO 27.8 Final    MONOCYTES % AUTO 7.8 Final    EOSINOPHIL % AUTO 3.3 Final    BASOPHILS % AUTO 0.4 Final    NEUTROPHIL ABS AUTO 2.50 Final    LYMPHOCYTE ABS AUTO 1.1 Final    MONOCYTES ABS AUTO 0.3 Final    EOSINOPHIL ABS AUTO  0.1 Final    BASOPHILS ABS AUTO 0 Final    ANISOCYTOSIS MOD (Abnl) Final    POIKILOCYTOSIS SL Final    MICROCYTOSIS MOD Final    POLYCHROMASIA MOD Final    HYPOCHROMIA SL Final    TARGET CELLS SL Final    OVALOCYTES SL Final    LEFT SHIFT NOT PRESENT Final    TEAR DROPS SL Final    PLATELET ESTIMATE, SMEAR Adequate Final   INR     Status: None   Result Value Status    INR 1.17 Final   APTT STUDIES     Status: Abnormal   Result Value Status    APTT 38.5 (H) Final   APTT STUDIES     Status: Abnormal   Result Value Status    APTT 41.5 (H) Final     INFLUENZA A Negative  Negative   Final     INFLUENZA B Negative  Negative  Final    RESPIRATORY SYNCYTIAL VIRUS Negative  Negative  Final    PARAINFLUENZA Negative  Negative  Final    CORONAVIRUS Negative  Negative  Final    HUMAN METAPNEUMOVIRUS Negative  Negative  Final    RHINOVIRUS/ENTEROVIRUS POSITIVE (Abnl) Negative  Final    ADENOVIRUS Negative  Negative  Final    HUMAN BOCAVIRUS Negative  Negative  Final    CHLAMYDOPHILA PNEUMONIAE Negative  Negative  Final    MYCOPLASMA PNEUMONIAE Negative  Negative  Final    Comment:       Xray left foot:  Flattening and fragmentation of the navicular bone, compatible with  subacute fracture. Regions of sclerosis within the navicular bone likely  reflect osteonecrosis.  Old healed fracture of the proximal fifth metatarsal.      ASSESSMENT & PLAN:    50 year old female past medical history of DM-2, CKD-4, PE on warfarin, dCHF, HTN with CHF exacerbation and subacute vitreal hemorrhage requiring ophtho retinal surgery which occurred but delayed due to supratherapeutic INR when admitted.    Glaucoma  vitreal hemorrhage  diabetic retinopathy  Right eye blindness  Transferred to [place] for urgent retinal surgery for elevated IOP, [facility], due to have surgery but held off due to elevated INR.  Procedure performed by [place] optho 10/25/15.  Continue brimonidine, latanoprost, cosopt  Retina f/u postprocedurally    AKI on CKD  Acute on chronic heart failure, diastolic  Pericardial effusion  Patient with baseline Cr 2.3.  Presented with Cr 3.5 rose to 3.7 with diureisis, improved with hydration and now with evidence of volume overload    Plan:   renally dose medications and avoid nephrotoxins  Monitor Cr off diuretics 1 additional day  Fluid restriction, daily weight    Cough  rhinorhea  Patient with rhinovirus.  Patient given vanc/cefepime in ED, but no evidence pneumonia and with viral URI.  Monitor off abx.  supportive care    HTN  Hypertensive on admission,  likely related to volume overload.  Monitor with diuresis.  cont home amlodipine, hydralazine.  Have hydralazine iv PRN available.    DM2  Peripheral neuropathy  Retinopathy  A1c in march was 9.8  Patient with BG normal with sliding scale alone despite home long acting.  Monitor and discharge on appropriate regimen.  Potentially discontinue home insulin, but will follow as renal function improves.  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  Feb V/Q without evidence of PE. Was on coumadin 20mg  daily but that resulting in supratherapeutic INR    Plan: continue heparin bridge, coumadin 10mg  x 1 today    Constipation - RESOLVED  Last BM 2 days prior admission and required enema, no evidence of obstruction, no prior abdominal surgeries, benign abd exam with bowel sounds.  Resolved with aggressive bowel regimen    Iron deficiency anemia  Hemoglobin at baseline of ~8, no evidence of blood loss, receives iron infusions (last dose 5/18), last iron studies in 08/2015 with ferritin 286 and Fe % saturation 15. Iron studies unrevealing  monitor CBC    Left foot fracture  Recent Charcot's neuropathic osteoarthropathy left foot versus neuropathic fracture of the navicular bone and fifth metatarsal. Seen by ortho with worsening osteonecrosis on xray.  Patient should be strict NWB  CAM boot  PT rec SNF  Patient will need follow up ortho outpt ~1wk following discharge.    Vulvar ulcerated lesion  Seen by gynecology on 5/16, swab positive for HSV 2, no Rx found in outside records for acyclovir or other antiviral  consider starting acyclovir or other antiviral if renal function stable  follow up with gynecology    FEN: renal/diabetic diet  DVT Prophylaxis: hep gtt until INR therapeutic 2-3 given  unprovoked saddle pe  Code status: Full  Dispo: SNF when medically stable --> patient expressed dissatisfication with [facility] (but appears may be only option)      Clarisse Gouge. Edd Fabian, MD  [phone]

## 2015-10-28 LAB — CBC WITH DIFFERENTIAL
Basophils %: 1 %
Basophils Abs: 0.04 K/MM3 (ref 0.0–0.5)
Eosinophils %: 1 %
Eosinophils Abs: 0.04 K/MM3 — ABNORMAL LOW (ref 0.1–0.3)
Hematocrit: 39.4 % (ref 34–46)
Hemoglobin: 12 g/dL (ref 12.0–16.0)
Lymphocytes %: 24 %
Lymphocytes Abs: 0.94 K/MM3 — ABNORMAL LOW (ref 1.0–4.8)
MCH: 21.6 pg — ABNORMAL LOW (ref 27–33)
MCHC: 30.6 % — ABNORMAL LOW (ref 32–36)
MCV: 70.7 UM3 — ABNORMAL LOW (ref 80–100)
MPV: 8 UM3 (ref 6.8–10.0)
Monocytes %: 7 %
Monocytes Abs: 0.27 K/MM3 (ref 0.2–0.4)
Myelocytes %: 1 %
Myelocytes Abs: 0.04 K/MM3 — ABNORMAL HIGH (ref 0.00–0.00)
Neutrophil Abs: 2.57 K/MM3 (ref 1.80–7.70)
Nucleated RBC/100 WBC: 2 %{WBCs}
Platelet Count: 199 K/MM3 (ref 130–400)
Platelet Estimate, Smear: ADEQUATE
Polys (Segs) %: 66 %
RDW: 18.7 U — ABNORMAL HIGH (ref 0–14.7)
Red Blood Cell Count: 5.57 M/MM3 — ABNORMAL HIGH (ref 3.7–5.5)
White Blood Cell Count: 3.9 K/MM3 — ABNORMAL LOW (ref 4.5–11.0)

## 2015-10-28 LAB — BASIC METABOLIC PANEL
Calcium: 8 mg/dL — ABNORMAL LOW (ref 8.6–10.5)
Carbon Dioxide Total: 21 meq/L — ABNORMAL LOW (ref 24–32)
Chloride: 109 meq/L (ref 95–110)
Creatinine Blood: 3.63 mg/dL — ABNORMAL HIGH (ref 0.44–1.27)
E-GFR, African American: 16 SEE NOTE — ABNORMAL LOW (ref >60)
E-GFR, Non-African American: 14 — ABNORMAL LOW (ref >60)
Glucose: 161 mg/dL — ABNORMAL HIGH (ref 70–99)
Potassium: 4.1 meq/L (ref 3.3–5.0)
Sodium: 139 meq/L (ref 135–145)
Urea Nitrogen, Blood (BUN): 41 mg/dL — ABNORMAL HIGH (ref 8–22)

## 2015-10-28 LAB — APTT STUDIES
aPTT: 61 s — ABNORMAL HIGH (ref 24.1–36.7)
aPTT: 40.9 s — ABNORMAL HIGH (ref 24.1–36.7)

## 2015-10-28 LAB — MAGNESIUM (MG): Magnesium (Mg): 1.9 mg/dL (ref 1.5–2.6)

## 2015-10-28 LAB — INR: INR: 1.46 — ABNORMAL HIGH (ref 0.87–1.18)

## 2015-10-28 LAB — PHOSPHORUS (PO4): PHOSPHORUS (PO4): 4.8 mg/dL (ref 2.4–5.0)

## 2015-10-28 LAB — CULTURE URINE, BACTI

## 2015-10-28 MED ORDER — WARFARIN 2 MG TABLET
10.0000 mg | ORAL_TABLET | ORAL | Status: AC
Start: 2015-10-28 — End: 2015-10-28
  Administered 2015-10-28: 10 mg via ORAL
  Filled 2015-10-28: qty 5

## 2015-10-28 MED ORDER — FUROSEMIDE 10 MG/ML INJECTION SOLUTION
40.0000 mg | INTRAMUSCULAR | Status: DC
Start: 2015-10-28 — End: 2015-11-01
  Administered 2015-10-28 – 2015-10-31 (×4): 40 mg via INTRAVENOUS
  Filled 2015-10-28 (×4): qty 4

## 2015-10-28 MED ORDER — NEOMYCIN 3.5 MG/G-POLYMYXIN B 10,000 UNIT/G-DEXAMETH 0.1 % EYE OINT
0.5000 [in_us] | TOPICAL_OINTMENT | Freq: Four times a day (QID) | OPHTHALMIC | Status: DC
Start: 2015-10-28 — End: 2015-11-04
  Administered 2015-10-28 – 2015-11-04 (×29): 0.5 [in_us] via OPHTHALMIC
  Filled 2015-10-28 (×2): qty 3.5

## 2015-10-28 NOTE — Nurse Assessment (Addendum)
ASSESSMENT NOTE    Note Started: 10/28/2015, 22:01     Initial assessment completed and recorded in EMR.  Report received from day shift nurse and orders reviewed. Upon initial assessment pt was sad, angry, and yelling, therapeutic communication was implemented, will continue to monitor for any acute changes. VSS, AOx4, afebrile, and complains of pain 7/10, administered Dilaudid. Goal of care includes comfort, safety, and rest. Plan of Care reviewed and appropriate, discussed with patient. Salena Sanereok Stoy Fenn, RN RN

## 2015-10-28 NOTE — Nurse Focus (Signed)
Pt requesting IV Dilaudid and stating she does not want to take the pill form. Explained to the pt that per the order, she is to receive the pill first, then she can receive the IV as a supplement to the oral pain medication during the four hour window after dilaudid PO is administered. Pt requesting that I call Dr Edd Fabiankamoto to discontinue the PO pain medication all together and only have the IV medication available to treat pain. Dr Edd Fabiankamoto informed. No new orders entered. Instructed to reinforce the pain medication schedule with the pt. Will continue to monitor.

## 2015-10-28 NOTE — Progress Notes (Signed)
INTERNAL MEDICINE DAILY PROGRESS NOTE  Date: 10/28/2015  Time:  12:47        Leslie Hernandez is a 50yo F with DM2, CKD4, PE on coumadin, dCHF, and HTN who presented 5/20 for progressive right eye vision loss 2/2 vitreal hemorrhage about to undergo vitrectomy but found to have supratherapeutic INR in PACU so sent to ED where she was found to have dyspnea, cough, rhinorrhea, nausea, and vomiting with an INR 4.87. She was found to have a Rhinovirus URI as well as decompensated CHF and subsequently admitted for further medical management.     INTERVAL HISTORY:    No overnight events.    SUBJECTIVE:    Pain persists in the right eye, reports being depressed    MEDICATIONS:  Scheduled Medications  Amlodipine (NORVASC) Tablet 10 mg, ORAL, QAM  Atorvastatin (LIPITOR) Tablet 80 mg, ORAL, Daily Bedtime  Brimonidine (ALPHAGAN) 0.2 % Ophthalmic Solution 1 drop, BOTH Eyes, TID  Docusate (COLACE) Capsule 100 mg, ORAL, BID  FUROsemide (LASIX) Injection 40 mg, IV, Q24H  Gabapentin (NEURONTIN) Capsule 100 mg, ORAL, TID  HydrALAZINE (APRESOLINE) Tablet 100 mg, ORAL, TID  Insulin Aspart (NOVOLOG) Injection Pen 1-4 Units, SUBCUTANEOUS, Daily Bedtime  Insulin Aspart (NOVOLOG) Injection Pen 1-6 Units, SUBCUTANEOUS, TID w/ meals - correction dose  Lidocaine (LIDODERM) 5 %(700 mg/patch) Patch 2 patch, Transdermal, Q24H Now  Lidocaine patch REMOVAL 1 patch, Transdermal, Q24H Now  Neomycin/Polymyxin/Dexamethasone (MAXITROL) Ophthalmic Ointment 0.5 inch, RIGHT Eye, QID  Polyethylene Glycol 3350 (MIRALAX) Oral Powder Packet 17 g, ORAL, QAM  Sennosides (SENOKOT) Tablet 17.2 mg, ORAL, Daily Bedtime  Warfarin Patient Flag, NOT APPLICABLE, PATIENT FLAG ONLY    IV Medications  Heparin, 100-1,500 Units/hr, IV, CONTINUOUS, Last Rate: 1,200 Units/hr (10/28/15 1119)    PRN Medications  Acetaminophen (TYLENOL) Tablet 650 mg, ORAL, Q4H PRN  Bisacodyl (DULCOLAX) Suppository 10 mg, RECTALLY, Q24H PRN  Dextrose 50% Injection 12.5 g, IV, PRN  Dextrose 50% Injection  25 g, IV, PRN  Glucagon Injection 1 mg, IM, PRN  HydrALAZINE (APRESOLINE) Injection 10 mg, IV, Q4H PRN  Hydromorphone (DILAUDID) Injection 1-1.5 mg, IV, Q3H PRN  Hydromorphone (DILAUDID) Tablet 2 mg, ORAL, Q4H PRN  Lactulose (CHRONULAC) 10 gram/15 mL Solution 30 mL, ORAL, Q6H PRN  Mineral Oil (FLEET) Enema 1 enema, RECTALLY, Bedtime PRN  Ondansetron (ZOFRAN) Injection 4 mg, IV, Q8H PRN  Promethazine (PHENERGAN) Injection 12.5 mg, IV, Q6H PRN        OBJECTIVE:   Vital Signs  Summary  Temp Min: 36.8 C (98.2 F) Max: 37 C (98.6 F)  BP: (128-154)/(77-84)   Pulse Min: 77 Max: 91  Resp Min: 18 Max: 18  SpO2 Min: 96 % Max: 99 %      Current Vitals  Temp: 37 C (98.6 F)  BP: 128/77  Pulse: 77  Resp: 18  SpO2: 96 %      Weight: 112.6 kg (248 lb 3.8 oz)     Intake and Output  Last Two Completed Shifts  In: 600 [Oral:600]  Out: 1850 [Urine:1850]    Current Shift  In: 300 [Oral:300]  Out: -       Physical Exam  General: obese female, No acute distress, conversational. A+Ox3  HEENT:  Moist oral mucosa.  Right occluded with bandages c/d/i  CV: Regular rate and rhythm, nl s1s2, no murmurs, rubs or gallops. No JVD.  Pulm: CTAB  good symmetric air movement  Abd: Soft NT ND, +BS. No rebound or guarding  Skin: No rashes. No jaundice.  Neuro: face symmetric, moving all 4 extremities spontaneously.  Left foot with TTP along lateral aspect.  Extrem:  1+ LE edema bilaterally.    LINES AND DRAINS:   piv    LAB TESTS/STUDIES:     I personally reviewed the following report(s)  .    Lab Results - 24 hours (excluding micro and POC)   BASIC METABOLIC PANEL     Status: Abnormal   Result Value Status    SODIUM 139 Final    POTASSIUM 4.1 Final    CHLORIDE 109 Final    CARBON DIOXIDE TOTAL 21 (L) Final    UREA NITROGEN, BLOOD (BUN) 41 (H) Final    CREATININE BLOOD 3.63 (H) Final    E-GFR, AFRICAN AMERICAN 16 (L) Final    E-GFR, NON-AFRICAN AMERICAN 14 (L) Final    GLUCOSE 161 (H) Final    CALCIUM 8.0 (L) Final   MAGNESIUM (MG)      Status: None   Result Value Status    MAGNESIUM (MG) 1.9 Final   PHOSPHORUS (PO4)     Status: None   Result Value Status    PHOSPHORUS (PO4) 4.8 Final   INR     Status: Abnormal   Result Value Status    INR 1.46 (H) Final   CBC WITH DIFFERENTIAL     Status: Abnormal   Result Value Status    WHITE BLOOD CELL COUNT 3.9 (L) Final    RED CELL COUNT 5.57 (H) Final    HEMOGLOBIN 12.0 Final    HEMATOCRIT 39.4 Final    MCV 70.7 (L) Final    MCH 21.6 (L) Final    MCHC 30.6 (L) Final    RDW 18.7 (H) Final    MPV 8.0 Final    PLATELET COUNT 199 Final    POLYS (SEGS)% 66 Final    LYMPHS % 24 Final    MONOCYTES % 7 Final    EOSINOPHILS % 1 Final    BASOPHILS % 1 Final    MYELOCYTES % 1 Final    NEUTROPHIL ABS 2.57 Final    LYMPHOCYTES ABS 0.94 (L) Final    MONOCYTES ABS 0.27 Final    EOSINOPHILS ABS 0.04 (L) Final    BASOPHILS ABS 0.04 Final    MYELOCYTES ABS 0.04 (H) Final    ANISOCYTOSIS MOD (Abnl) Final    POIKILOCYTOSIS MOD Final    MICROCYTOSIS MOD Final    POLYCHROMASIA SL Final    HYPOCHROMIA SL Final    TARGET CELLS SL Final    OVALOCYTES SL Final    SCHISTOCYTES SL Final    TEAR DROPS SL Final    PLATELET ESTIMATE, SMEAR Adequate Final    NUCLEATED RBC/100 WBC 2 Final   APTT STUDIES     Status: Abnormal   Result Value Status    APTT 61.0 (H) Final     INFLUENZA A Negative  Negative  Final     INFLUENZA B Negative  Negative  Final    RESPIRATORY SYNCYTIAL VIRUS Negative  Negative  Final    PARAINFLUENZA Negative  Negative  Final    CORONAVIRUS Negative  Negative  Final    HUMAN METAPNEUMOVIRUS Negative  Negative  Final    RHINOVIRUS/ENTEROVIRUS POSITIVE (Abnl) Negative  Final    ADENOVIRUS Negative  Negative  Final    HUMAN BOCAVIRUS Negative  Negative  Final    CHLAMYDOPHILA PNEUMONIAE Negative  Negative  Final    MYCOPLASMA PNEUMONIAE Negative  Negative  Final  Comment:       Xray left foot:  Flattening and fragmentation of the navicular bone, compatible with  subacute fracture. Regions of sclerosis within  the navicular bone likely  reflect osteonecrosis.  Old healed fracture of the proximal fifth metatarsal.      ASSESSMENT & PLAN:    50 year old female past medical history of DM-2, CKD-4, PE on warfarin, dCHF, HTN with CHF exacerbation and subacute vitreal hemorrhage requiring ophtho retinal surgery which occurred but delayed due to supratherapeutic INR when admitted.    Glaucoma  vitreal hemorrhage  diabetic retinopathy  Right eye blindness  Transferred to [place] for urgent retinal surgery for elevated IOP, underwent anterior chamber tap with improved IOP at [facility], due to having surgery but held off due to elevated INR.  Procedure performed by [place] optho 10/25/15.  Continue brimonidine, maxitrol, and atropine (unable to obtain 2/2 shortage)  Retina f/u postprocedurally    AKI on CKD  Acute on chronic heart failure, diastolic  Pericardial effusion  Patient with baseline Cr 2.3.  Presented with Cr 3.5 rose to 3.7 with diureisis, improved with hydration and now with evidence of peripheral volume overload    Plan:   renally dose medications and avoid nephrotoxins  Monitor Cr  Start lasix 40mg  daily  Fluid restriction, daily weight    Cough  rhinorhea  Patient with rhinovirus.  Patient given vanc/cefepime in ED, but no evidence pneumonia and with viral URI.  Monitor off abx.  supportive care    HTN  Hypertensive on admission, likely related to volume overload.  Monitor with diuresis.  cont home amlodipine, hydralazine.  Have hydralazine iv PRN available.    DM2  Peripheral neuropathy  Retinopathy  A1c in march was 9.8  Patient with BG normal with sliding scale alone despite home long acting.  Monitor and discharge on appropriate regimen.  Potentially discontinue home insulin, but will follow as renal function improves.  sliding scale  follow up A1C  Gabapentin reduced to 100mg  TID given renal function    PE (saddle embolus) status post IVC filter  supratherapeutic INR  The PE was diagnosed 3 years ago at [facility] with  IVC placed with plan for lifelong [facility] given unprovoked and severity.  Feb V/Q without evidence of PE. Was on coumadin 20mg  daily but that resulting in supratherapeutic INR    Plan: continue heparin bridge, coumadin 10mg  x 1 today    Constipation - RESOLVED  Last BM 2 days prior admission and required enema, no evidence of obstruction, no prior abdominal surgeries, benign abd exam with bowel sounds.  Resolved with aggressive bowel regimen    Iron deficiency anemia  Hemoglobin at baseline of ~8, no evidence of blood loss, receives iron infusions (last dose 5/18), last iron studies in 08/2015 with ferritin 286 and Fe % saturation 15. Iron studies unrevealing  monitor CBC    Left foot fracture  Recent Charcot's neuropathic osteoarthropathy left foot versus neuropathic fracture of the navicular bone and fifth metatarsal. Seen by ortho with worsening osteonecrosis on xray.  Patient should be strict NWB  CAM boot  PT rec SNF  Patient will need follow up ortho outpt ~1wk following discharge.    Vulvar ulcerated lesion  Seen by gynecology on 5/16, swab positive for HSV 2, no Rx found in outside records for acyclovir or other antiviral  consider starting acyclovir or other antiviral if renal function stable  follow up with gynecology    FEN: renal/diabetic diet  DVT Prophylaxis: hep gtt until INR therapeutic 2-3 given unprovoked saddle pe  Code status: Full  Dispo: SNF when medically stable --> patient expressed dissatisfication with [facility] (but appears may be only option)      Clarisse Gouge. Edd Fabian, MD  [phone]

## 2015-10-28 NOTE — Plan of Care (Addendum)
Problem: Patient Care Overview (Adult)  Goal: Plan of Care Review  Outcome: Ongoing (interventions implemented as appropriate)  Goal Outcome Evaluation Note     Leslie Hernandez is a [age]yr female admitted 10/20/2015      OUTCOME SUMMARY AND PLAN MOVING FORWARD:   Pt expressed that she felt sad during shift, applied therapeutic communication to pt, pt reported feeling better. AOx4, VSS, resting in bed, not in any acute distress. Heparin gtt at 1300 units/hr per order till morning due to pt refusing any more sticks after attempt from Rapid response, placed on do not stick list. Pt's night time care given, slept 4-5 hours during shift. Will continue to monitor.     Problem: Sleep Pattern Disturbance (Adult)  Goal: Adequate Sleep/Rest  Patient will demonstrate the desired outcomes by discharge/transition of care.   Outcome: Ongoing (interventions implemented as appropriate)    Problem: Fall Risk (Adult)  Goal: Absence of Falls  Patient will demonstrate the desired outcomes by discharge/transition of care.   Outcome: Ongoing (interventions implemented as appropriate)    Problem: Mobility, Physical Impaired (Adult)  Goal: Enhanced Mobility Skills  Patient will demonstrate the desired outcomes by discharge/transition of care.   Outcome: Ongoing (interventions implemented as appropriate)    Problem: Fluid Volume Excess (Adult,Obstetrics,Pediatric)  Goal: Stable Weight  Patient will demonstrate the desired outcomes by discharge/transition of care.   Outcome: Ongoing (interventions implemented as appropriate)    Problem: Pain, Acute (Adult)  Goal: Acceptable Pain Control/Comfort Level  Patient will demonstrate the desired outcomes by discharge/transition of care.   Outcome: Ongoing (interventions implemented as appropriate)    Problem: Pressure Ulcer Risk (Braden Scale) (Adult,Obstetrics,Pediatric)  Goal: Skin Integrity  Patient will demonstrate the desired outcomes by discharge/transition of care.   Outcome: Ongoing  (interventions implemented as appropriate)

## 2015-10-28 NOTE — Allied Health Progress (Signed)
Pastoral Care Progress Note                                 Spiritual Assessment Form    Chaplain: Wyvonne LenzGalen Kalima Saylor, Chaplain Date of Note:  10/28/2015   Patient Name:  Leslie Hernandez Unit:  [facility] Hospital Medicine Faculty Service   Faith Affiliation/Tradition: NO RELIGIOUS PREF Age:  [age]yr  Sex:  female   Admit Date:  10/20/2015 Admit Diagnosis:      Spouse/Significant Other Name/Relationship: none    Reason(s) for visit: Nursing staff felt visit might help with attitude and perspective.     Current Religious/Spiritual Practices: Preferred religious affiliation is [employer] [employer] [facility] [employer], but doesn't seem to be part of her focus at present    Spiritual Strengths: Able to ask for support and help     Patient Response to Visit:  Receptive     Pastoral/Spiritual Needs Assessment: Hopelessness, low self-worth, and sense that no one cares for her or everyone is against her.    Ritual Needs: Prayer    Impact of Needs on Patient's and Others: Abrasive with some anger/resentment surfacing.    Pastoral/Spiritual Care Intervention: Provided supportive and non-judgemental listening as I explored beliefs / values. Overall outlook was gloomy with no one in her corner after her daughter died in February. Listening to her and the nursing staff, I get that she sometimes can be verbally mean toward staff. I asked about positives in her life, but heard mostly about past experiences with [place] and family in [place]. I believe a second visit could focus on what part she has to play in meeting her needs and getting better.    Ritual Provided: Prayer     Outcomes of Care: Emotional release and Progress with trust    Continuity of Care / Goals:    1. Continuing care: Follow-up for by me in two days.    2. Referrals: None        Contact Information:     Environmental education officerGalen Clorissa Gruenberg  Chaplain  Pager # [phone]

## 2015-10-28 NOTE — Nurse Focus (Signed)
Chaplain called for pt due to pt states she is depressed. MD also informed of pt's statement. Pt reports losing her only daughter in February and is tearful stating that she was her only support system and she "feels all alone" now. Pt provided with emotional support. Will continue to monitor.

## 2015-10-29 LAB — BASIC METABOLIC PANEL
CALCIUM: 7.6 mg/dL — AB (ref 8.6–10.5)
CARBON DIOXIDE TOTAL: 21 meq/L — AB (ref 24–32)
CHLORIDE: 110 meq/L (ref 95–110)
CREATININE BLOOD: 3.82 mg/dL — AB (ref 0.44–1.27)
E-GFR, AFRICAN AMERICAN: 15 SEE NOTE — AB (ref >60)
E-GFR, NON-AFRICAN AMERICAN: 13 — AB (ref >60)
GLUCOSE: 232 mg/dL — AB (ref 70–99)
POTASSIUM: 3.7 meq/L (ref 3.3–5.0)
SODIUM: 138 meq/L (ref 135–145)
UREA NITROGEN, BLOOD (BUN): 44 mg/dL — AB (ref 8–22)

## 2015-10-29 LAB — CBC WITH DIFFERENTIAL
BASOPHILS % AUTO: 0.7 %
BASOPHILS ABS AUTO: 0 10*3/uL (ref 0–0.2)
EOSINOPHIL % AUTO: 3.8 %
EOSINOPHIL ABS AUTO: 0.2 10*3/uL (ref 0–0.5)
HEMATOCRIT: 27.2 % — AB (ref 34–46)
HEMOGLOBIN: 8.6 g/dL — AB (ref 12.0–16.0)
LYMPHOCYTE ABS AUTO: 1.5 10*3/uL (ref 1.0–4.8)
LYMPHOCYTES % AUTO: 27.6 %
MCH: 22.1 pg — AB (ref 27–33)
MCHC: 31.4 % — AB (ref 32–36)
MCV: 70.2 UM3 — AB (ref 80–100)
MONOCYTES % AUTO: 8.8 %
MONOCYTES ABS AUTO: 0.5 10*3/uL (ref 0.1–0.8)
MPV: 7.5 UM3 (ref 6.8–10.0)
NEUTROPHIL ABS AUTO: 3.2 10*3/uL (ref 1.80–7.70)
NEUTROPHILS % AUTO: 59.1 %
PLATELET COUNT: 241 10*3/uL (ref 130–400)
PLATELET ESTIMATE, SMEAR: ADEQUATE
RDW: 18.3 U — AB (ref 0–14.7)
RED CELL COUNT: 3.88 10*6/uL (ref 3.7–5.5)
WHITE BLOOD CELL COUNT: 5.4 10*3/uL (ref 4.5–11.0)

## 2015-10-29 LAB — MAGNESIUM (MG): Magnesium (Mg): 1.8 mg/dL (ref 1.5–2.6)

## 2015-10-29 LAB — CBC NO DIFFERENTIAL
HEMATOCRIT: 28.4 % — AB (ref 34–46)
HEMOGLOBIN: 8.9 g/dL — AB (ref 12.0–16.0)
MCH: 21.9 pg — AB (ref 27–33)
MCHC: 31.2 % — AB (ref 32–36)
MCV: 70.2 UM3 — AB (ref 80–100)
MPV: 7.4 UM3 (ref 6.8–10.0)
PLATELET COUNT: 293 10*3/uL (ref 130–400)
RDW: 18.5 U — AB (ref 0–14.7)
RED CELL COUNT: 4.05 10*6/uL (ref 3.7–5.5)
WHITE BLOOD CELL COUNT: 6.3 10*3/uL (ref 4.5–11.0)

## 2015-10-29 LAB — CHLORIDE SPOT URINE: Chloride Spot Urine: 95 meq/L

## 2015-10-29 LAB — APTT STUDIES
APTT: 53.2 s — AB (ref 24.1–36.7)
APTT: 38 s — AB (ref 24.1–36.7)
APTT: 47.4 s — AB (ref 24.1–36.7)
aPTT: 66.9 s — ABNORMAL HIGH (ref 24.1–36.7)

## 2015-10-29 LAB — INR: INR: 1.88 — ABNORMAL HIGH (ref 0.87–1.18)

## 2015-10-29 LAB — PHOSPHORUS (PO4): PHOSPHORUS (PO4): 4.8 mg/dL (ref 2.4–5.0)

## 2015-10-29 LAB — POTASSIUM SPOT URINE: POTASSIUM SPOT URINE: 15.5 meq/L

## 2015-10-29 LAB — TYPE AND SCREEN
ANTIBODY SCREEN (ORTHO GEL): NEGATIVE
PATIENT BLOOD TYPE: A POS

## 2015-10-29 LAB — UREA NITROGEN SPOT URINE: UREA NITROGEN SPOT URINE: 228 mg/dL

## 2015-10-29 LAB — CREATININE SPOT URINE: Creatinine Spot Urine: 45.63 mg/dL

## 2015-10-29 LAB — SODIUM SPOT URINE: SODIUM SPOT URINE: 95 meq/L

## 2015-10-29 MED ORDER — WARFARIN 2 MG TABLET
9.0000 mg | ORAL_TABLET | ORAL | Status: AC
Start: 2015-10-29 — End: 2015-10-29
  Administered 2015-10-29: 9 mg via ORAL
  Filled 2015-10-29: qty 1

## 2015-10-29 NOTE — Progress Notes (Signed)
INTERNAL MEDICINE DAILY PROGRESS NOTE  Date: 10/29/2015  Time:  12:03        Leslie Hernandez is a 50yo F with DM2, CKD4, PE on coumadin, dCHF, and HTN who presented 5/20 for progressive right eye vision loss 2/2 vitreal hemorrhage about to undergo vitrectomy but found to have supratherapeutic INR in PACU so sent to ED where she was found to have dyspnea, cough, rhinorrhea, nausea, and vomiting with an INR 4.87. She was found to have a Rhinovirus URI as well as decompensated CHF and subsequently admitted for further medical management.     INTERVAL HISTORY:    No overnight events.  Hgb 10.6->12->8.6 without any evidence of bleeding  INR 1.88    SUBJECTIVE:    Pain persists in the right eye  Feels nauseous.     MEDICATIONS:  Scheduled Medications  Amlodipine (NORVASC) Tablet 10 mg, ORAL, QAM  Atorvastatin (LIPITOR) Tablet 80 mg, ORAL, Daily Bedtime  Brimonidine (ALPHAGAN) 0.2 % Ophthalmic Solution 1 drop, BOTH Eyes, TID  Docusate (COLACE) Capsule 100 mg, ORAL, BID  FUROsemide (LASIX) Injection 40 mg, IV, Q24H  Gabapentin (NEURONTIN) Capsule 100 mg, ORAL, TID  HydrALAZINE (APRESOLINE) Tablet 100 mg, ORAL, TID  Insulin Aspart (NOVOLOG) Injection Pen 1-4 Units, SUBCUTANEOUS, Daily Bedtime  Insulin Aspart (NOVOLOG) Injection Pen 1-6 Units, SUBCUTANEOUS, TID w/ meals - correction dose  Lidocaine (LIDODERM) 5 %(700 mg/patch) Patch 2 patch, Transdermal, Q24H Now  Lidocaine patch REMOVAL 1 patch, Transdermal, Q24H Now  Neomycin/Polymyxin/Dexamethasone (MAXITROL) Ophthalmic Ointment 0.5 inch, RIGHT Eye, QID  Polyethylene Glycol 3350 (MIRALAX) Oral Powder Packet 17 g, ORAL, QAM  Sennosides (SENOKOT) Tablet 17.2 mg, ORAL, Daily Bedtime  Warfarin Patient Flag, NOT APPLICABLE, PATIENT FLAG ONLY    IV Medications  Heparin, 100-1,500 Units/hr, IV, CONTINUOUS, Last Rate: 1,200 Units/hr (10/29/15 0730)    PRN Medications  Acetaminophen (TYLENOL) Tablet 650 mg, ORAL, Q4H PRN  Bisacodyl (DULCOLAX) Suppository 10 mg, RECTALLY, Q24H PRN  Dextrose  50% Injection 12.5 g, IV, PRN  Dextrose 50% Injection 25 g, IV, PRN  Glucagon Injection 1 mg, IM, PRN  HydrALAZINE (APRESOLINE) Injection 10 mg, IV, Q4H PRN  Hydromorphone (DILAUDID) Injection 1-1.5 mg, IV, Q3H PRN  Hydromorphone (DILAUDID) Tablet 2 mg, ORAL, Q4H PRN  Lactulose (CHRONULAC) 10 gram/15 mL Solution 30 mL, ORAL, Q6H PRN  Mineral Oil (FLEET) Enema 1 enema, RECTALLY, Bedtime PRN  Ondansetron (ZOFRAN) Injection 4 mg, IV, Q8H PRN  Promethazine (PHENERGAN) Injection 12.5 mg, IV, Q6H PRN        OBJECTIVE:   Vital Signs  Summary  Temp Min: 36.4 C (97.5 F) Max: 36.9 C (98.5 F)  BP: (130-164)/(78-89)   Pulse Min: 83 Max: 87  Resp Min: 18 Max: 20  SpO2 Min: 98 % Max: 99 %      Current Vitals  Temp: 36.4 C (97.5 F)  BP: 164/89  Pulse: 87  Resp: 18  SpO2: 98 %      Weight: 112.6 kg (248 lb 3.8 oz)     Intake and Output  Last Two Completed Shifts  In: 300 [Oral:300]  Out: 800 [Urine:800]    Current Shift         Physical Exam  General: obese female, No acute distress, conversational. A+Ox3  HEENT:  Moist oral mucosa.  Right occluded with bandages c/d/i  CV: Regular rate and rhythm, nl s1s2, no murmurs, rubs or gallops. No JVD.  Pulm: CTAB  good symmetric air movement  Abd: Soft NT ND, +BS. No rebound or  guarding  Skin: No rashes. No jaundice.  Neuro: face symmetric, moving all 4 extremities spontaneously.  Left foot with TTP along lateral aspect.  Extrem:  1+ LE edema bilaterally.    LINES AND DRAINS:   piv    LAB TESTS/STUDIES:     I personally reviewed the following report(s)  .    Lab Results - 24 hours (excluding micro and POC)   APTT STUDIES     Status: Abnormal   Result Value Status    APTT 40.9 (H) Final   APTT STUDIES     Status: Abnormal   Result Value Status    APTT 66.9 (H) Final   BASIC METABOLIC PANEL     Status: Abnormal   Result Value Status    SODIUM 138 Final    POTASSIUM 3.7 Final    CHLORIDE 110 Final    CARBON DIOXIDE TOTAL 21 (L) Final    UREA NITROGEN, BLOOD (BUN) 44 (H) Final     CREATININE BLOOD 3.82 (H) Final    E-GFR, AFRICAN AMERICAN 15 (L) Final    E-GFR, NON-AFRICAN AMERICAN 13 (L) Final    GLUCOSE 232 (H) Final    CALCIUM 7.6 (L) Final   MAGNESIUM (MG)     Status: None   Result Value Status    MAGNESIUM (MG) 1.8 Final   PHOSPHORUS (PO4)     Status: None   Result Value Status    PHOSPHORUS (PO4) 4.8 Final   INR     Status: Abnormal   Result Value Status    INR 1.88 (H) Final   APTT STUDIES     Status: Abnormal   Result Value Status    APTT 53.2 (H) Final   CBC WITH DIFFERENTIAL     Status: Abnormal   Result Value Status    WHITE BLOOD CELL COUNT 5.4 Final    RED CELL COUNT 3.88 Final    HEMOGLOBIN 8.6 (L) Final    HEMATOCRIT 27.2 (L) Final    MCV 70.2 (L) Final    MCH 22.1 (L) Final    MCHC 31.4 (L) Final    RDW 18.3 (H) Final    MPV 7.5 Final    PLATELET COUNT 241 Final    NEUTROPHILS % AUTO 59.1 Final    LYMPHOCYTES % AUTO 27.6 Final    MONOCYTES % AUTO 8.8 Final    EOSINOPHIL % AUTO 3.8 Final    BASOPHILS % AUTO 0.7 Final    NEUTROPHIL ABS AUTO 3.20 Final    LYMPHOCYTE ABS AUTO 1.5 Final    MONOCYTES ABS AUTO 0.5 Final    EOSINOPHIL ABS AUTO 0.2 Final    BASOPHILS ABS AUTO 0 Final    ANISOCYTOSIS MOD (Abnl) Final    POIKILOCYTOSIS SL Final    MICROCYTOSIS MOD Final    POLYCHROMASIA SL Final    HYPOCHROMIA SL Final    OVALOCYTES SL Final    LEFT SHIFT NOT PRESENT Final    PLATELET ESTIMATE, SMEAR Adequate Final     INFLUENZA A Negative  Negative  Final     INFLUENZA B Negative  Negative  Final    RESPIRATORY SYNCYTIAL VIRUS Negative  Negative  Final    PARAINFLUENZA Negative  Negative  Final    CORONAVIRUS Negative  Negative  Final    HUMAN METAPNEUMOVIRUS Negative  Negative  Final    RHINOVIRUS/ENTEROVIRUS POSITIVE (Abnl) Negative  Final    ADENOVIRUS Negative  Negative  Final    HUMAN BOCAVIRUS Negative  Negative  Final    CHLAMYDOPHILA PNEUMONIAE Negative  Negative  Final    MYCOPLASMA PNEUMONIAE Negative  Negative  Final    Comment:       Xray left foot:  Flattening  and fragmentation of the navicular bone, compatible with  subacute fracture. Regions of sclerosis within the navicular bone likely  reflect osteonecrosis.  Old healed fracture of the proximal fifth metatarsal.      ASSESSMENT & PLAN:    50 year old female past medical history of DM-2, CKD-4, PE on warfarin, dCHF, HTN with CHF exacerbation and subacute vitreal hemorrhage requiring ophtho retinal surgery which occurred but delayed due to supratherapeutic INR when admitted.    Glaucoma  vitreal hemorrhage  diabetic retinopathy  Right eye blindness  Transferred to [place] for urgent retinal surgery for elevated IOP, underwent anterior chamber tap with improved IOP at [facility], due to having surgery but held off due to elevated INR.  Procedure performed by [facility] 10/25/15.  Continue brimonidine, maxitrol, and atropine (unable to obtain 2/2 shortage)  Retina f/u postprocedurally    AKI on CKD  Acute on chronic heart failure, diastolic  Pericardial effusion  Patient with baseline Cr 2.3.  Presented with Cr 3.5 rose to 3.7 with diureisis, improved with hydration and now with evidence of peripheral volume overload    Plan:   renally dose medications and avoid nephrotoxins  Monitor Cr  continue lasix 40mg  IV daily  Fluid restriction, daily weight    Anemia  Her blood counts are erratic but I think her baseline is around 8-9  Plan: repeat CBC with T&S    Cough  rhinorhea  Patient with rhinovirus.  Patient given vanc/cefepime in ED, but no evidence pneumonia and with viral URI.  Monitor off abx.  supportive care    HTN  Hypertensive on admission, likely related to volume overload.  Monitor with diuresis.  cont home amlodipine, hydralazine.  Have hydralazine iv PRN available.    DM2  Peripheral neuropathy  Retinopathy  A1c in march was 9.8  Patient with BG normal with sliding scale alone despite home long acting.  Monitor and discharge on appropriate regimen.  Potentially discontinue home insulin, but will follow as renal function  improves.  sliding scale  follow up A1C  Gabapentin reduced to 100mg  TID given renal function    PE (saddle embolus) status post IVC filter  supratherapeutic INR  The PE was diagnosed 3 years ago at [facility] with IVC placed with plan for lifelong AC given unprovoked and severity.  Feb V/Q without evidence of PE. Was on coumadin 20mg  daily but that resulting in supratherapeutic INR    Plan: continue heparin bridge, coumadin 9mg  x 1 today    Constipation - RESOLVED  Last BM 2 days prior admission and required enema, no evidence of obstruction, no prior abdominal surgeries, benign abd exam with bowel sounds.  Resolved with aggressive bowel regimen    Iron deficiency anemia  Hemoglobin at baseline of ~8, no evidence of blood loss, receives iron infusions (last dose 5/18), last iron studies in 08/2015 with ferritin 286 and Fe % saturation 15. Iron studies unrevealing  monitor CBC    Left foot fracture  Recent Charcot's neuropathic osteoarthropathy left foot versus neuropathic fracture of the navicular bone and fifth metatarsal. Seen by ortho with worsening osteonecrosis on xray.  Patient should be strict NWB  CAM boot  PT rec SNF  Patient will need follow up ortho outpt ~1wk following discharge.  Vulvar ulcerated lesion  Seen by gynecology on 5/16, swab positive for HSV 2, no Rx found in outside records for acyclovir or other antiviral  consider starting acyclovir or other antiviral if renal function stable  follow up with gynecology    FEN: renal/diabetic diet  DVT Prophylaxis: hep gtt until INR therapeutic 2-3 given unprovoked saddle pe  Code status: Full  Dispo: SNF when medically stable --> patient expressed dissatisfication with [facility] (but appears may be only option)      Leslie Hernandez. Edd Fabian, MD  [phone]

## 2015-10-29 NOTE — Plan of Care (Signed)
Problem: Patient Care Overview (Adult)  Goal: Plan of Care Review  Outcome: Ongoing (interventions implemented as appropriate)  Goal Outcome Evaluation Note     Leslie Hernandez is a 78yrfemale admitted 10/20/2015      OUTCOME SUMMARY AND PLAN MOVING FORWARD:      Patient alert and oriented x4, VSS except for bp in the high 150s. Patient maintained good communication and behavior with staff this shift. Continue to transfer to [facility] with minimal assistance. Continuing on heparin drip, pain control for R eye, and lidocaine patch for L back. Pt still require urine sample collection as urine x1 shift was contaminated for feces and toilet paper. Hemoglobin trending downwards, now at 8.9, type and screen completed this shift. Needs met and monitored closely.  Goal: Individualization and Mutuality  Outcome: Ongoing (interventions implemented as appropriate)  Goal: Discharge Needs Assessment  Outcome: Ongoing (interventions implemented as appropriate)    Problem: Sleep Pattern Disturbance (Adult)  Goal: Adequate Sleep/Rest  Patient will demonstrate the desired outcomes by discharge/transition of care.   Outcome: Ongoing (interventions implemented as appropriate)    Problem: Urine Elimination, Impaired (Adult)  Goal: Effective Containment of Urine  Patient will demonstrate the desired outcomes by discharge/transition of care.   Outcome: Outcome(s) achieved Date Met:  10/29/15  Goal: Reduced Incontinence Episodes  Patient will demonstrate the desired outcomes by discharge/transition of care.   Outcome: Outcome(s) achieved Date Met:  10/29/15    10/29/15 1607   Urine Elimination, Impaired (Adult)   Reduced Incontinence Episodes achieves outcome         Problem: Fall Risk (Adult)  Goal: Absence of Falls  Patient will demonstrate the desired outcomes by discharge/transition of care.   Outcome: Ongoing (interventions implemented as appropriate)    Problem: Mobility, Physical Impaired (Adult)  Goal: Enhanced Mobility Skills  Patient  will demonstrate the desired outcomes by discharge/transition of care.   Outcome: Ongoing (interventions implemented as appropriate)  Goal: Enhanced Functionality Ability  Patient will demonstrate the desired outcomes by discharge/transition of care.   Outcome: Ongoing (interventions implemented as appropriate)    Problem: Fluid Volume Excess (Adult,Obstetrics,Pediatric)  Goal: Stable Weight  Patient will demonstrate the desired outcomes by discharge/transition of care.   Outcome: Ongoing (interventions implemented as appropriate)    Problem: Pain, Acute (Adult)  Goal: Acceptable Pain Control/Comfort Level  Patient will demonstrate the desired outcomes by discharge/transition of care.   Outcome: Ongoing (interventions implemented as appropriate)    Problem: Pressure Ulcer Risk (Braden Scale) (Adult,Obstetrics,Pediatric)  Goal: Skin Integrity  Patient will demonstrate the desired outcomes by discharge/transition of care.   Outcome: Ongoing (interventions implemented as appropriate)

## 2015-10-29 NOTE — Plan of Care (Addendum)
Problem: Patient Care Overview (Adult)  Goal: Plan of Care Review  Outcome: Ongoing (interventions implemented as appropriate)  Goal Outcome Evaluation Note     Leslie Hernandez is a [age]yr female admitted 10/20/2015      OUTCOME SUMMARY AND PLAN MOVING FORWARD:   Pt agitated, angry, and emotionally unstable throughout the shift, therapeutic communication provided, no improvements observed. VSS, AOx4, night time care provided. Continuous heparin gtt maintained and monitored, aPTT drawn q6hrs. Slept 6-7 hours during shift. Will continue to monitor.      Problem: Sleep Pattern Disturbance (Adult)  Goal: Adequate Sleep/Rest  Patient will demonstrate the desired outcomes by discharge/transition of care.   Outcome: Ongoing (interventions implemented as appropriate)    Problem: Urine Elimination, Impaired (Adult)  Goal: Effective Containment of Urine  Patient will demonstrate the desired outcomes by discharge/transition of care.   Outcome: Ongoing (interventions implemented as appropriate)    Problem: Fall Risk (Adult)  Goal: Absence of Falls  Patient will demonstrate the desired outcomes by discharge/transition of care.   Outcome: Ongoing (interventions implemented as appropriate)    Problem: Mobility, Physical Impaired (Adult)  Goal: Enhanced Mobility Skills  Patient will demonstrate the desired outcomes by discharge/transition of care.   Outcome: Ongoing (interventions implemented as appropriate)    Problem: Fluid Volume Excess (Adult,Obstetrics,Pediatric)  Goal: Stable Weight  Patient will demonstrate the desired outcomes by discharge/transition of care.   Outcome: Ongoing (interventions implemented as appropriate)    Problem: Pain, Acute (Adult)  Goal: Acceptable Pain Control/Comfort Level  Patient will demonstrate the desired outcomes by discharge/transition of care.   Outcome: Ongoing (interventions implemented as appropriate)    Problem: Pressure Ulcer Risk (Braden Scale) (Adult,Obstetrics,Pediatric)  Goal: Skin  Integrity  Patient will demonstrate the desired outcomes by discharge/transition of care.   Outcome: Ongoing (interventions implemented as appropriate)

## 2015-10-29 NOTE — Nurse Assessment (Signed)
ASSESSMENT NOTE    Note Started: 10/29/2015, 07:50     Initial assessment completed and recorded in EMR.  Report received from night shift nurse and orders reviewed. Discussed acceptable behaviors, communication, and maintaining good relationship with staff. Plan of Care reviewed and appropriate, discussed with patient .  Claudette Headuth Dvid Pendry, RN

## 2015-10-29 NOTE — Nurse Assessment (Signed)
ASSESSMENT NOTE    Note Started: 10/29/2015, 19:18     Initial assessment completed and recorded in EMR.  Report received from day shift nurse and orders reviewed. Plan of Care reviewed and updated, discussed with patient.  Ezequias Lard, RN

## 2015-10-30 LAB — CBC WITH DIFFERENTIAL
BASOPHILS % AUTO: 0.8 %
BASOPHILS ABS AUTO: 0.1 10*3/uL (ref 0–0.2)
EOSINOPHIL % AUTO: 3.1 %
EOSINOPHIL ABS AUTO: 0.2 10*3/uL (ref 0–0.5)
HEMATOCRIT: 28.3 % — AB (ref 34–46)
HEMOGLOBIN: 8.7 g/dL — AB (ref 12.0–16.0)
LYMPHOCYTE ABS AUTO: 1.7 10*3/uL (ref 1.0–4.8)
LYMPHOCYTES % AUTO: 25.3 %
MCH: 21.6 pg — AB (ref 27–33)
MCHC: 30.9 % — AB (ref 32–36)
MCV: 69.8 UM3 — AB (ref 80–100)
MONOCYTES % AUTO: 7.3 %
MONOCYTES ABS AUTO: 0.5 10*3/uL (ref 0.1–0.8)
MPV: 7.3 UM3 (ref 6.8–10.0)
NEUTROPHIL ABS AUTO: 4.2 10*3/uL (ref 1.80–7.70)
NEUTROPHILS % AUTO: 63.5 %
PLATELET COUNT: 285 10*3/uL (ref 130–400)
PLATELET ESTIMATE, SMEAR: ADEQUATE
RDW: 18.2 U — AB (ref 0–14.7)
RED CELL COUNT: 4.05 10*6/uL (ref 3.7–5.5)
WHITE BLOOD CELL COUNT: 6.6 10*3/uL (ref 4.5–11.0)

## 2015-10-30 LAB — INR: INR: 2.15 — AB (ref 0.87–1.18)

## 2015-10-30 LAB — BASIC METABOLIC PANEL
CALCIUM: 8.3 mg/dL — AB (ref 8.6–10.5)
CARBON DIOXIDE TOTAL: 22 meq/L — AB (ref 24–32)
CHLORIDE: 107 meq/L (ref 95–110)
CREATININE BLOOD: 3.75 mg/dL — AB (ref 0.44–1.27)
E-GFR, AFRICAN AMERICAN: 16 SEE NOTE — AB (ref >60)
E-GFR, NON-AFRICAN AMERICAN: 14 — AB (ref >60)
GLUCOSE: 211 mg/dL — AB (ref 70–99)
POTASSIUM: 3.7 meq/L (ref 3.3–5.0)
SODIUM: 138 meq/L (ref 135–145)
UREA NITROGEN, BLOOD (BUN): 45 mg/dL — AB (ref 8–22)

## 2015-10-30 LAB — MAGNESIUM (MG): MAGNESIUM (MG): 1.9 mg/dL (ref 1.5–2.6)

## 2015-10-30 LAB — APTT STUDIES: APTT: 58.9 s — AB (ref 24.1–36.7)

## 2015-10-30 LAB — PHOSPHORUS (PO4): PHOSPHORUS (PO4): 4.9 mg/dL (ref 2.4–5.0)

## 2015-10-30 MED ORDER — WARFARIN 2 MG TABLET
9.0000 mg | ORAL_TABLET | ORAL | Status: AC
Start: 2015-10-30 — End: 2015-10-30
  Administered 2015-10-30: 18:00:00 9 mg via ORAL
  Filled 2015-10-30: qty 1

## 2015-10-30 NOTE — Plan of Care (Signed)
Problem: Patient Care Overview (Adult)  Goal: Plan of Care Review  Outcome: Ongoing (interventions implemented as appropriate)  Goal Outcome Evaluation Note     Leslie Hernandez is a [age]yr female admitted 10/20/2015      OUTCOME SUMMARY AND PLAN MOVING FORWARD:      Pt had no acute events overnight; heparin drip rate increased according to protocols; VSS  Pt up to bedside commode x1 with minimal assistance; pending placement       Goal: Individualization and Mutuality  Outcome: Ongoing (interventions implemented as appropriate)  Goal: Discharge Needs Assessment  Outcome: Ongoing (interventions implemented as appropriate)    Problem: Sleep Pattern Disturbance (Adult)  Goal: Adequate Sleep/Rest  Patient will demonstrate the desired outcomes by discharge/transition of care.   Outcome: Ongoing (interventions implemented as appropriate)    Problem: Fall Risk (Adult)  Goal: Absence of Falls  Patient will demonstrate the desired outcomes by discharge/transition of care.   Outcome: Ongoing (interventions implemented as appropriate)    Problem: Mobility, Physical Impaired (Adult)  Goal: Enhanced Mobility Skills  Patient will demonstrate the desired outcomes by discharge/transition of care.   Outcome: Ongoing (interventions implemented as appropriate)  Goal: Enhanced Functionality Ability  Patient will demonstrate the desired outcomes by discharge/transition of care.   Outcome: Ongoing (interventions implemented as appropriate)    Problem: Fluid Volume Excess (Adult,Obstetrics,Pediatric)  Goal: Stable Weight  Patient will demonstrate the desired outcomes by discharge/transition of care.   Outcome: Ongoing (interventions implemented as appropriate)  Goal: Balanced Intake/Output  Patient will demonstrate the desired outcomes by discharge/transition of care.   Outcome: Ongoing (interventions implemented as appropriate)    Problem: Pain, Acute (Adult)  Goal: Acceptable Pain Control/Comfort Level  Patient will demonstrate the desired  outcomes by discharge/transition of care.   Outcome: Ongoing (interventions implemented as appropriate)    Problem: Pressure Ulcer Risk (Braden Scale) (Adult,Obstetrics,Pediatric)  Goal: Skin Integrity  Patient will demonstrate the desired outcomes by discharge/transition of care.   Outcome: Ongoing (interventions implemented as appropriate)

## 2015-10-30 NOTE — Nurse Assessment (Signed)
ASSESSMENT NOTE    Note Started: 10/30/2015, 21:48     Initial assessment completed and recorded in EMR.  Report received from day shift nurse and orders reviewed. Plan of Care reviewed and appropriate, discussed with patient.  Mallie SnooksSalih Zan Triska, RN RN

## 2015-10-30 NOTE — Plan of Care (Signed)
Problem: Patient Care Overview (Adult)  Goal: Plan of Care Review  Outcome: Ongoing (interventions implemented as appropriate)  Goal: Individualization and Mutuality  Outcome: Ongoing (interventions implemented as appropriate)  Goal: Discharge Needs Assessment  Outcome: Ongoing (interventions implemented as appropriate)    Problem: Sleep Pattern Disturbance (Adult)  Goal: Adequate Sleep/Rest  Patient will demonstrate the desired outcomes by discharge/transition of care.   Outcome: Ongoing (interventions implemented as appropriate)    Problem: Fall Risk (Adult)  Goal: Absence of Falls  Patient will demonstrate the desired outcomes by discharge/transition of care.   Outcome: Ongoing (interventions implemented as appropriate)    Problem: Mobility, Physical Impaired (Adult)  Goal: Enhanced Mobility Skills  Patient will demonstrate the desired outcomes by discharge/transition of care.   Outcome: Ongoing (interventions implemented as appropriate)  Goal: Enhanced Functionality Ability  Patient will demonstrate the desired outcomes by discharge/transition of care.   Outcome: Ongoing (interventions implemented as appropriate)    Problem: Fluid Volume Excess (Adult,Obstetrics,Pediatric)  Goal: Stable Weight  Patient will demonstrate the desired outcomes by discharge/transition of care.   Outcome: Ongoing (interventions implemented as appropriate)  Goal: Balanced Intake/Output  Patient will demonstrate the desired outcomes by discharge/transition of care.   Outcome: Ongoing (interventions implemented as appropriate)    Problem: Pain, Acute (Adult)  Goal: Acceptable Pain Control/Comfort Level  Patient will demonstrate the desired outcomes by discharge/transition of care.   Outcome: Ongoing (interventions implemented as appropriate)    Problem: Pressure Ulcer Risk (Braden Scale) (Adult,Obstetrics,Pediatric)  Goal: Skin Integrity  Patient will demonstrate the desired outcomes by discharge/transition of care.   Outcome: Ongoing  (interventions implemented as appropriate)

## 2015-10-30 NOTE — Nurse Assessment (Signed)
ASSESSMENT NOTE    Note Started: 10/30/2015, 07:14     Initial assessment completed and recorded in EMR.  Report received from night shift nurse and orders reviewed. Patient reports headache.   The plan for the day is pain management.    Plan of Care reviewed and appropriate, discussed with patient.   Tillman AbideEllen Kordelia Severin, RN RN

## 2015-10-30 NOTE — Allied Health Consult (Signed)
Name: Lora PaulaDorothy Ollis MRN: [id number]    Date of Service: 10/30/2015      Clinical Case Management Progress Note    Comments: Case reviewed, [facility] with [facility], [facility], desired other SNF but no accepting facility, updated [facility] [phone], call admin coordinator La Plantaziel, she will review and call back.      Further follow-up needed: Coordinate SNF discharge once facility accepts.    Date: 10/30/2015  Time: 13:45    Electronically Signed by:  Rogue BussingGeorge Lysander Calixte, Case Manager, RN  Pager: [phone]

## 2015-10-30 NOTE — Allied Health Progress (Addendum)
Date of Service: 10/30/2015   Time in: 1021  Total time: 0 minutes    Attempted to see patient in AM, patient stating she got pain medications and now is too groggy to participate in physical therapy and does not feel safe to get out of bed at this time. Patient requesting to have physical therapy in PM. Will attempt again in PM as time permits.     Luna Kitchensimothy Tubra  Physical Therapist Assistant II  Department of Physical Medicine & Rehabilitation  Vocera # [phone] [name](Timothy Camdenubra)   [place] #[id number]      CCS Linking Language:  I have reviewed the patient specific evaluation/therapy plan of care/progress note with the physical therapist and agree with the treatment and plan of care that has been developed.     11/05/2015    Kern Reaprisha Armanda Forand, PT III (te)    CCS Paneled Provider   PI# [id number]  Physical Medicine and Rehabilitation  Vocera [phone]

## 2015-10-30 NOTE — Progress Notes (Signed)
INTERNAL MEDICINE DAILY PROGRESS NOTE  Date: 10/30/2015  Time:  15:21        [name] is a 50yo F with DM2, CKD4, PE on coumadin, dCHF, and HTN who presented 5/20 for progressive right eye vision loss 2/2 vitreal hemorrhage about to undergo vitrectomy but found to have supratherapeutic INR in PACU so sent to ED where she was found to have dyspnea, cough, rhinorrhea, nausea, and vomiting with an INR 4.87. She was found to have a Rhinovirus URI as well as decompensated CHF and subsequently admitted for further medical management.     INTERVAL HISTORY:    INR 2.15    SUBJECTIVE:    Still with pain, nausea, and vomiting, white emesis, minimal stomach ache    MEDICATIONS:  Scheduled Medications  Amlodipine (NORVASC) Tablet 10 mg, ORAL, QAM  Atorvastatin (LIPITOR) Tablet 80 mg, ORAL, Daily Bedtime  Brimonidine (ALPHAGAN) 0.2 % Ophthalmic Solution 1 drop, BOTH Eyes, TID  Docusate (COLACE) Capsule 100 mg, ORAL, BID  FUROsemide (LASIX) Injection 40 mg, IV, Q24H  Gabapentin (NEURONTIN) Capsule 100 mg, ORAL, TID  HydrALAZINE (APRESOLINE) Tablet 100 mg, ORAL, TID  Insulin Aspart (NOVOLOG) Injection Pen 1-4 Units, SUBCUTANEOUS, Daily Bedtime  Insulin Aspart (NOVOLOG) Injection Pen 1-6 Units, SUBCUTANEOUS, TID w/ meals - correction dose  Lidocaine (LIDODERM) 5 %(700 mg/patch) Patch 2 patch, Transdermal, Q24H Now  Lidocaine patch REMOVAL 1 patch, Transdermal, Q24H Now  Neomycin/Polymyxin/Dexamethasone (MAXITROL) Ophthalmic Ointment 0.5 inch, RIGHT Eye, QID  Polyethylene Glycol 3350 (MIRALAX) Oral Powder Packet 17 g, ORAL, QAM  Sennosides (SENOKOT) Tablet 17.2 mg, ORAL, Daily Bedtime  Warfarin (COUMADIN) Tablet 9 mg, ORAL, Today x1  Warfarin Patient Flag, NOT APPLICABLE, PATIENT FLAG ONLY    IV Medications   PRN Medications  Acetaminophen (TYLENOL) Tablet 650 mg, ORAL, Q4H PRN  Bisacodyl (DULCOLAX) Suppository 10 mg, RECTALLY, Q24H PRN  Dextrose 50% Injection 12.5 g, IV, PRN  Dextrose 50% Injection 25 g, IV, PRN  Glucagon Injection  1 mg, IM, PRN  HydrALAZINE (APRESOLINE) Injection 10 mg, IV, Q4H PRN  Hydromorphone (DILAUDID) Injection 1-1.5 mg, IV, Q3H PRN  Hydromorphone (DILAUDID) Tablet 2 mg, ORAL, Q4H PRN  Lactulose (CHRONULAC) 10 gram/15 mL Solution 30 mL, ORAL, Q6H PRN  Mineral Oil (FLEET) Enema 1 enema, RECTALLY, Bedtime PRN  Ondansetron (ZOFRAN) Injection 4 mg, IV, Q8H PRN  Promethazine (PHENERGAN) Injection 12.5 mg, IV, Q6H PRN        OBJECTIVE:   Vital Signs  Summary  Temp Min: 36.6 C (97.8 F) Max: 37 C (98.6 F)  BP: (157-186)/(79-102)   Pulse Min: 88 Max: 94  Resp Min: 14 Max: 18  SpO2 Min: 97 % Max: 98 %      Current Vitals  Temp: 37 C (98.6 F)  BP: 171/83  Pulse: 94  Resp: 18  SpO2: 97 %      Weight: 111 kg (244 lb 12.8 oz)     Intake and Output  Last Two Completed Shifts  In: 1020 [Oral:1020]  Out: 2050 [Urine:2050]    Current Shift  In: 350 [Oral:350]  Out: 1000 [Urine:900; Emesis:100]      Physical Exam  General: obese female, No acute distress, conversational. A+Ox3  HEENT:  Moist oral mucosa.  Right occluded with bandages c/d/i  CV: Regular rate and rhythm, nl s1s2, no murmurs, rubs or gallops. No JVD.  Pulm: CTAB  good symmetric air movement  Abd: Soft NT ND, +BS. No rebound or guarding  Skin: No rashes. No jaundice.  Neuro:  face symmetric, moving all 4 extremities spontaneously.  Left foot with TTP along lateral aspect.  Extrem:  1+ LE edema bilaterally.    LINES AND DRAINS:   piv    LAB TESTS/STUDIES:     I personally reviewed the following report(s)  .    Lab Results - 24 hours (excluding micro and POC)   CREATININE SPOT URINE     Status: None   Result Value Status    CREATININE SPOT URINE 45.63 Final   SODIUM SPOT URINE     Status: None   Result Value Status    SODIUM SPOT URINE 95 Final   CHLORIDE SPOT URINE     Status: None   Result Value Status    CHLORIDE SPOT URINE 95 Final   POTASSIUM SPOT URINE     Status: None   Result Value Status    POTASSIUM SPOT URINE 15.5 Final   UREA NITROGEN SPOT URINE      Status: None   Result Value Status    UREA NITROGEN SPOT URINE 228 Final   APTT STUDIES     Status: Abnormal   Result Value Status    APTT 47.4 (H) Final   BASIC METABOLIC PANEL     Status: Abnormal   Result Value Status    SODIUM 138 Final    POTASSIUM 3.7 Final    CHLORIDE 107 Final    CARBON DIOXIDE TOTAL 22 (L) Final    UREA NITROGEN, BLOOD (BUN) 45 (H) Final    CREATININE BLOOD 3.75 (H) Final    E-GFR, AFRICAN AMERICAN 16 (L) Final    E-GFR, NON-AFRICAN AMERICAN 14 (L) Final    GLUCOSE 211 (H) Final    CALCIUM 8.3 (L) Final   MAGNESIUM (MG)     Status: None   Result Value Status    MAGNESIUM (MG) 1.9 Final   PHOSPHORUS (PO4)     Status: None   Result Value Status    PHOSPHORUS (PO4) 4.9 Final   INR     Status: Abnormal   Result Value Status    INR 2.15 (H) Final   CBC WITH DIFFERENTIAL     Status: Abnormal   Result Value Status    WHITE BLOOD CELL COUNT 6.6 Final    RED CELL COUNT 4.05 Final    HEMOGLOBIN 8.7 (L) Final    HEMATOCRIT 28.3 (L) Final    MCV 69.8 (L) Final    MCH 21.6 (L) Final    MCHC 30.9 (L) Final    RDW 18.2 (H) Final    MPV 7.3 Final    PLATELET COUNT 285 Final    NEUTROPHILS % AUTO 63.5 Final    LYMPHOCYTES % AUTO 25.3 Final    MONOCYTES % AUTO 7.3 Final    EOSINOPHIL % AUTO 3.1 Final    BASOPHILS % AUTO 0.8 Final    NEUTROPHIL ABS AUTO 4.20 Final    LYMPHOCYTE ABS AUTO 1.7 Final    MONOCYTES ABS AUTO 0.5 Final    EOSINOPHIL ABS AUTO 0.2 Final    BASOPHILS ABS AUTO 0.10 Final    ANISOCYTOSIS SL (Abnl) Final    POIKILOCYTOSIS MOD Final    MICROCYTOSIS MK Final    HYPOCHROMIA SL Final    OVALOCYTES SL Final    LEFT SHIFT NOT PRESENT Final    TEAR DROPS SL Final    PLATELET ESTIMATE, SMEAR Adequate Final   APTT STUDIES     Status: Abnormal   Result Value Status  APTT 58.9 (H) Final     INFLUENZA A Negative  Negative  Final     INFLUENZA B Negative  Negative  Final    RESPIRATORY SYNCYTIAL VIRUS Negative  Negative  Final    PARAINFLUENZA Negative  Negative  Final    CORONAVIRUS Negative   Negative  Final    HUMAN METAPNEUMOVIRUS Negative  Negative  Final    RHINOVIRUS/ENTEROVIRUS POSITIVE (Abnl) Negative  Final    ADENOVIRUS Negative  Negative  Final    HUMAN BOCAVIRUS Negative  Negative  Final    CHLAMYDOPHILA PNEUMONIAE Negative  Negative  Final    MYCOPLASMA PNEUMONIAE Negative  Negative  Final    Comment:       Xray left foot:  Flattening and fragmentation of the navicular bone, compatible with  subacute fracture. Regions of sclerosis within the navicular bone likely  reflect osteonecrosis.  Old healed fracture of the proximal fifth metatarsal.      ASSESSMENT & PLAN:    50 year old female past medical history of DM-2, CKD-4, PE on warfarin, dCHF, HTN with CHF exacerbation and subacute vitreal hemorrhage requiring ophtho retinal surgery which occurred but delayed due to supratherapeutic INR when admitted.    Nausea, vomiting  Suspect viral gastroenteritis  Plan: supportive care, phenergan, zofran    Glaucoma  vitreal hemorrhage  diabetic retinopathy  Right eye blindness  Transferred to [place] for urgent retinal surgery for elevated IOP, underwent anterior chamber tap with improved IOP at [facility], due to having surgery but held off due to elevated INR.  Procedure performed by [name] optho 10/25/15.  Continue brimonidine, maxitrol, and atropine (unable to obtain 2/2 shortage)  Retina f/u postprocedurally    AKI on CKD  Acute on chronic heart failure, diastolic  Pericardial effusion  Patient with baseline Cr 2.3.  Presented with Cr 3.5 rose to 3.7 with diureisis, improved with hydration and now with evidence of peripheral volume overload    Plan:   renally dose medications and avoid nephrotoxins  Monitor Cr  continue lasix  IV daily  Fluid restriction, daily weight    Anemia  Her blood counts are erratic but I think her baseline is around 8-9  Plan: repeat CBC with T&S    Cough  rhinorhea  Patient with rhinovirus.  Patient given vanc/cefepime in ED, but no evidence pneumonia and with viral URI.   Monitor off abx.  supportive care    HTN  Hypertensive on admission, likely related to volume overload.  Monitor with diuresis.  cont home amlodipine, hydralazine.  Have hydralazine iv PRN available.    DM2  Peripheral neuropathy  Retinopathy  A1c in march was 9.8  Patient with BG normal with sliding scale alone despite home long acting.  Monitor and discharge on appropriate regimen.  Potentially discontinue home insulin, but will follow as renal function improves.  sliding scale  follow up A1C  Gabapentin reduced to  TID given renal function    PE (saddle embolus) status post IVC filter  supratherapeutic INR  The PE was diagnosed 3 years ago at [facility] with IVC placed with plan for lifelong AC given unprovoked and severity.  Feb V/Q without evidence of PE. Was on coumadin  daily resulting in supratherapeutic INR on presentation here.  Now with therapeutic INR    Plan: DC heparin bridge, coumadin  x 1 today    Constipation - RESOLVED  Last BM 2 days prior admission and required enema, no evidence of obstruction, no prior  abdominal surgeries, benign abd exam with bowel sounds.  Resolved with aggressive bowel regimen    Iron deficiency anemia  Hemoglobin at baseline of ~8, no evidence of blood loss, receives iron infusions (last dose 5/18), last iron studies in 08/2015 with ferritin 286 and Fe % saturation 15. Iron studies unrevealing  monitor CBC    Left foot fracture  Recent Charcot's neuropathic osteoarthropathy left foot versus neuropathic fracture of the navicular bone and fifth metatarsal. Seen by ortho with worsening osteonecrosis on xray.  Patient should be strict NWB  CAM boot  PT rec SNF  Patient will need follow up ortho outpt ~1wk following discharge.    Vulvar ulcerated lesion  Seen by gynecology on 5/16, swab positive for HSV 2, no Rx found in outside records for acyclovir or other antiviral  consider starting acyclovir or other antiviral if renal function stable  follow up with  gynecology    FEN: renal/diabetic diet  DVT Prophylaxis: hep gtt until INR therapeutic 2-3 given unprovoked saddle pe  Code status: Full  Dispo: SNF when medically stable --> patient expressed dissatisfication with [facility] (but appears may be only option)      Leslie Hernandez. Edd Fabian, MD  [phone]

## 2015-10-31 LAB — CBC WITH DIFFERENTIAL
BASOPHILS % AUTO: 0.5 %
BASOPHILS ABS AUTO: 0 10*3/uL (ref 0–0.2)
EOSINOPHIL % AUTO: 2 %
EOSINOPHIL ABS AUTO: 0.1 10*3/uL (ref 0–0.5)
HEMATOCRIT: 28.3 % — AB (ref 34–46)
HEMOGLOBIN: 9 g/dL — AB (ref 12.0–16.0)
LYMPHOCYTE ABS AUTO: 1 10*3/uL (ref 1.0–4.8)
LYMPHOCYTES % AUTO: 15 %
MCH: 22.1 pg — AB (ref 27–33)
MCHC: 31.7 % — AB (ref 32–36)
MCV: 69.7 UM3 — AB (ref 80–100)
MONOCYTES % AUTO: 7.3 %
MONOCYTES ABS AUTO: 0.5 10*3/uL (ref 0.1–0.8)
MPV: 7.4 UM3 (ref 6.8–10.0)
NEUTROPHIL ABS AUTO: 5 10*3/uL (ref 1.80–7.70)
NEUTROPHILS % AUTO: 75.2 %
PLATELET COUNT: 259 10*3/uL (ref 130–400)
PLATELET ESTIMATE, SMEAR: ADEQUATE
RDW: 18.9 U — AB (ref 0–14.7)
RED CELL COUNT: 4.06 10*6/uL (ref 3.7–5.5)
WHITE BLOOD CELL COUNT: 6.7 10*3/uL (ref 4.5–11.0)

## 2015-10-31 LAB — BASIC METABOLIC PANEL
CALCIUM: 8.4 mg/dL — AB (ref 8.6–10.5)
CARBON DIOXIDE TOTAL: 22 meq/L — AB (ref 24–32)
CHLORIDE: 107 meq/L (ref 95–110)
CREATININE BLOOD: 3.77 mg/dL — AB (ref 0.44–1.27)
E-GFR, AFRICAN AMERICAN: 15 SEE NOTE — AB (ref >60)
E-GFR, NON-AFRICAN AMERICAN: 13 — AB (ref >60)
GLUCOSE: 230 mg/dL — AB (ref 70–99)
POTASSIUM: 3.8 meq/L (ref 3.3–5.0)
SODIUM: 139 meq/L (ref 135–145)
UREA NITROGEN, BLOOD (BUN): 46 mg/dL — AB (ref 8–22)

## 2015-10-31 LAB — MAGNESIUM (MG): MAGNESIUM (MG): 1.8 mg/dL (ref 1.5–2.6)

## 2015-10-31 LAB — INR: INR: 2.87 — AB (ref 0.87–1.18)

## 2015-10-31 LAB — PHOSPHORUS (PO4): PHOSPHORUS (PO4): 5 mg/dL (ref 2.4–5.0)

## 2015-10-31 MED ORDER — WARFARIN 7.5 MG TABLET
7.5000 mg | ORAL_TABLET | ORAL | Status: AC
Start: 2015-10-31 — End: 2015-10-31
  Administered 2015-10-31: 7.5 mg via ORAL
  Filled 2015-10-31: qty 1

## 2015-10-31 MED ORDER — DIPHENHYDRAMINE 25 MG CAPSULE
25.0000 mg | ORAL_CAPSULE | Freq: Four times a day (QID) | ORAL | Status: DC | PRN
Start: 2015-10-31 — End: 2015-11-05
  Administered 2015-10-31 – 2015-11-04 (×5): 25 mg via ORAL
  Filled 2015-10-31 (×5): qty 1

## 2015-10-31 NOTE — Nurse Assessment (Signed)
ASSESSMENT NOTE    Note Started: 10/31/2015, 07:14     Initial assessment completed and recorded in EMR.  Report received from night shift nurse and orders reviewed. Patient reports R.eye pain.   The plan for the day is pain management.    Plan of Care reviewed and appropriate, discussed with patient.   Tillman AbideEllen Jasleen Riepe, RN RN

## 2015-10-31 NOTE — Allied Health Progress (Signed)
Physical Therapy Progress Note     Date of Service: 10/31/2015   Time in: 1535  Total time: 30 Minutes    S: Wondering when her vision in R eye is going to come back.  Vision in L is blurry, with floor being on a slant.  Agreeable to treatment.    O: Cleared by RN for follow-up treatment.   Patient able to state   Patient received sitting edge of bed, independent.   Squat pivot transfer, bed to wheel chair, stand-by assist.  No assistive device.   Wheel chair mobility, minimal assistance, for eye sight and management during turns.  60 feet x 1.  Dependently rolled back to her room.   -patient complaining of fatigue, dizziness, difficulty focusing.   In room. BP 163/91, HR 96, SPO2 99% RA.   -patient having difficulty keeping her eyes.   2 lift team members present due to PTA lifting restrictions. Transferred patient back-to-bed.   Care transitioned back to nursing.  RN bedside.     A: Patient unable to maintain weight bearing precaution during transfers.  Vision makes wheel chair use challenging.      P: Monitor vitals.  Improve squat pivot transfers.         Tasia CatchingsGilbert Josias Tomerlin, Physical Therapist Assistant  NPI [id number]  Dept. Of Physical Medicine & Rehabilitation / Acute Care Services  Vocera [phone]

## 2015-10-31 NOTE — Plan of Care (Signed)
Problem: Patient Care Overview (Adult)  Goal: Plan of Care Review  Outcome: Ongoing (interventions implemented as appropriate)  Goal: Individualization and Mutuality  Outcome: Ongoing (interventions implemented as appropriate)  Goal: Discharge Needs Assessment  Outcome: Ongoing (interventions implemented as appropriate)    Problem: Sleep Pattern Disturbance (Adult)  Goal: Adequate Sleep/Rest  Patient will demonstrate the desired outcomes by discharge/transition of care.   Outcome: Ongoing (interventions implemented as appropriate)    Problem: Fall Risk (Adult)  Goal: Absence of Falls  Patient will demonstrate the desired outcomes by discharge/transition of care.   Outcome: Ongoing (interventions implemented as appropriate)    Problem: Mobility, Physical Impaired (Adult)  Goal: Enhanced Mobility Skills  Patient will demonstrate the desired outcomes by discharge/transition of care.   Outcome: Ongoing (interventions implemented as appropriate)  Goal: Enhanced Functionality Ability  Patient will demonstrate the desired outcomes by discharge/transition of care.   Outcome: Ongoing (interventions implemented as appropriate)    Problem: Fluid Volume Excess (Adult,Obstetrics,Pediatric)  Goal: Stable Weight  Patient will demonstrate the desired outcomes by discharge/transition of care.   Outcome: Ongoing (interventions implemented as appropriate)  Goal: Balanced Intake/Output  Patient will demonstrate the desired outcomes by discharge/transition of care.   Outcome: Ongoing (interventions implemented as appropriate)    Problem: Pain, Acute (Adult)  Goal: Acceptable Pain Control/Comfort Level  Patient will demonstrate the desired outcomes by discharge/transition of care.   Outcome: Ongoing (interventions implemented as appropriate)    Problem: Pressure Ulcer Risk (Braden Scale) (Adult,Obstetrics,Pediatric)  Goal: Skin Integrity  Patient will demonstrate the desired outcomes by discharge/transition of care.   Outcome: Ongoing  (interventions implemented as appropriate)

## 2015-10-31 NOTE — Progress Notes (Signed)
INTERNAL MEDICINE DAILY PROGRESS NOTE  Date: 10/31/2015  Time:  14:27        Leslie Hernandez is a 50yo F with DM2, CKD4, PE on coumadin, dCHF, and HTN who presented 5/20 for progressive right eye vision loss 2/2 vitreal hemorrhage about to undergo vitrectomy but found to have supratherapeutic INR in PACU so sent to ED where she was found to have dyspnea, cough, rhinorrhea, nausea, and vomiting with an INR 4.87. She was found to have a Rhinovirus URI as well as decompensated CHF and subsequently admitted for further medical management.     INTERVAL HISTORY:    INR 2.87    SUBJECTIVE:    Vomiting resolved, still minimally nauseated    MEDICATIONS:  Scheduled Medications  Amlodipine (NORVASC) Tablet 10 mg, ORAL, QAM  Atorvastatin (LIPITOR) Tablet 80 mg, ORAL, Daily Bedtime  Brimonidine (ALPHAGAN) 0.2 % Ophthalmic Solution 1 drop, BOTH Eyes, TID  Docusate (COLACE) Capsule 100 mg, ORAL, BID  FUROsemide (LASIX) Injection 40 mg, IV, Q24H  Gabapentin (NEURONTIN) Capsule 100 mg, ORAL, TID  HydrALAZINE (APRESOLINE) Tablet 100 mg, ORAL, TID  Insulin Aspart (NOVOLOG) Injection Pen 1-4 Units, SUBCUTANEOUS, Daily Bedtime  Insulin Aspart (NOVOLOG) Injection Pen 1-6 Units, SUBCUTANEOUS, TID w/ meals - correction dose  Lidocaine (LIDODERM) 5 %(700 mg/patch) Patch 2 patch, Transdermal, Q24H Now  Lidocaine patch REMOVAL 1 patch, Transdermal, Q24H Now  Neomycin/Polymyxin/Dexamethasone (MAXITROL) Ophthalmic Ointment 0.5 inch, RIGHT Eye, QID  Polyethylene Glycol 3350 (MIRALAX) Oral Powder Packet 17 g, ORAL, QAM  Sennosides (SENOKOT) Tablet 17.2 mg, ORAL, Daily Bedtime  Warfarin Patient Flag, NOT APPLICABLE, PATIENT FLAG ONLY    IV Medications   PRN Medications  Acetaminophen (TYLENOL) Tablet 650 mg, ORAL, Q4H PRN  Bisacodyl (DULCOLAX) Suppository 10 mg, RECTALLY, Q24H PRN  Dextrose 50% Injection 12.5 g, IV, PRN  Dextrose 50% Injection 25 g, IV, PRN  DiphenhydrAMINE (BENADRYL) Capsule 25 mg, ORAL, Q6H PRN  Glucagon Injection 1 mg, IM,  PRN  HydrALAZINE (APRESOLINE) Injection 10 mg, IV, Q4H PRN  Hydromorphone (DILAUDID) Injection 1-1.5 mg, IV, Q3H PRN  Hydromorphone (DILAUDID) Tablet 2 mg, ORAL, Q4H PRN  Lactulose (CHRONULAC) 10 gram/15 mL Solution 30 mL, ORAL, Q6H PRN  Mineral Oil (FLEET) Enema 1 enema, RECTALLY, Bedtime PRN  Ondansetron (ZOFRAN) Injection 4 mg, IV, Q8H PRN  Promethazine (PHENERGAN) Injection 12.5 mg, IV, Q6H PRN        OBJECTIVE:   Vital Signs  Summary  Temp Min: 37.1 C (98.7 F) Max: 37.3 C (99.2 F)  BP: (139-172)/(83-98)   Pulse Min: 84 Max: 92  Resp Min: 18 Max: 18  SpO2 Min: 96 % Max: 99 %      Current Vitals  Temp: 37.2 C (99 F)  BP: 139/84  Pulse: 84  Resp: 18  SpO2: 98 %      Weight: 111 kg (244 lb 13 oz)     Intake and Output  Last Two Completed Shifts  In: 1410 [Oral:1410]  Out: 2000 [Urine:1900; Emesis:100]    Current Shift  In: 100 [Oral:100]  Out: -       Physical Exam  General: obese female, No acute distress, conversational. A+Ox3  HEENT:  Moist oral mucosa.  Right occluded with bandages c/d/i  CV: Regular rate and rhythm, nl s1s2, no murmurs, rubs or gallops. No JVD.  Pulm: CTAB  good symmetric air movement  Abd: Soft NT ND, +BS. No rebound or guarding  Skin: No rashes. No jaundice.  Neuro: face symmetric, moving all 4 extremities  spontaneously.  Left foot with TTP along lateral aspect.  Extrem:  1+ LE edema bilaterally.    LINES AND DRAINS:   piv    LAB TESTS/STUDIES:     I personally reviewed the following report(s)  .    Lab Results - 24 hours (excluding micro and POC)   BASIC METABOLIC PANEL     Status: Abnormal   Result Value Status    SODIUM 139 Final    POTASSIUM 3.8 Final    CHLORIDE 107 Final    CARBON DIOXIDE TOTAL 22 (L) Final    UREA NITROGEN, BLOOD (BUN) 46 (H) Final    CREATININE BLOOD 3.77 (H) Final    E-GFR, AFRICAN AMERICAN 15 (L) Final    E-GFR, NON-AFRICAN AMERICAN 13 (L) Final    GLUCOSE 230 (H) Final    CALCIUM 8.4 (L) Final   MAGNESIUM (MG)     Status: None   Result Value  Status    MAGNESIUM (MG) 1.8 Final   PHOSPHORUS (PO4)     Status: None   Result Value Status    PHOSPHORUS (PO4) 5.0 Final   INR     Status: Abnormal   Result Value Status    INR 2.87 (H) Final   CBC WITH DIFFERENTIAL     Status: Abnormal   Result Value Status    WHITE BLOOD CELL COUNT 6.7 Final    RED CELL COUNT 4.06 Final    HEMOGLOBIN 9.0 (L) Final    HEMATOCRIT 28.3 (L) Final    MCV 69.7 (L) Final    MCH 22.1 (L) Final    MCHC 31.7 (L) Final    RDW 18.9 (H) Final    MPV 7.4 Final    PLATELET COUNT 259 Final    NEUTROPHILS % AUTO 75.2 Final    LYMPHOCYTES % AUTO 15.0 Final    MONOCYTES % AUTO 7.3 Final    EOSINOPHIL % AUTO 2.0 Final    BASOPHILS % AUTO 0.5 Final    NEUTROPHIL ABS AUTO 5.00 Final    LYMPHOCYTE ABS AUTO 1.0 Final    MONOCYTES ABS AUTO 0.5 Final    EOSINOPHIL ABS AUTO 0.1 Final    BASOPHILS ABS AUTO 0 Final    ANISOCYTOSIS SL (Abnl) Final    POIKILOCYTOSIS SL Final    MICROCYTOSIS MK Final    LEFT SHIFT PRESENT Final    TEAR DROPS SL Final    PLATELET ESTIMATE, SMEAR Adequate Final     INFLUENZA A Negative  Negative  Final     INFLUENZA B Negative  Negative  Final    RESPIRATORY SYNCYTIAL VIRUS Negative  Negative  Final    PARAINFLUENZA Negative  Negative  Final    CORONAVIRUS Negative  Negative  Final    HUMAN METAPNEUMOVIRUS Negative  Negative  Final    RHINOVIRUS/ENTEROVIRUS POSITIVE (Abnl) Negative  Final    ADENOVIRUS Negative  Negative  Final    HUMAN BOCAVIRUS Negative  Negative  Final    CHLAMYDOPHILA PNEUMONIAE Negative  Negative  Final    MYCOPLASMA PNEUMONIAE Negative  Negative  Final    Comment:       Xray left foot:  Flattening and fragmentation of the navicular bone, compatible with  subacute fracture. Regions of sclerosis within the navicular bone likely  reflect osteonecrosis.  Old healed fracture of the proximal fifth metatarsal.      ASSESSMENT & PLAN:    50 year old female past medical history of DM-2, CKD-4, PE on  warfarin, dCHF, HTN with CHF exacerbation and subacute  vitreal hemorrhage requiring ophtho retinal surgery which occurred but delayed due to supratherapeutic INR when admitted.    Nausea, vomiting  Suspected viral gastroenteritis, improved symptomatically  Plan: supportive care, phenergan, zofran    Glaucoma  vitreal hemorrhage  diabetic retinopathy  Right eye blindness  Transferred to [place] for urgent retinal surgery for elevated IOP, underwent anterior chamber tap with improved IOP at [facility], due to having surgery but held off due to elevated INR.  Procedure performed by [place] optho 10/25/15.  Continue brimonidine, maxitrol, and atropine (unable to obtain 2/2 shortage)  Retina f/u postprocedurally    AKI on CKD  Acute on chronic heart failure, diastolic  Pericardial effusion  Patient with baseline Cr 2.3.  Presented with Cr 3.5 rose to 3.7 with diureisis, improved with hydration and now with evidence of peripheral volume overload    Plan:   renally dose medications and avoid nephrotoxins  Monitor Cr  Change to lasix  po daily  Fluid restriction, daily weight    Anemia  Her blood counts are erratic but I think her baseline is around 8-9  Plan: repeat CBC with T&S    Cough  rhinorhea  Patient with rhinovirus.  Patient given vanc/cefepime in ED, but no evidence pneumonia and with viral URI.  Monitor off abx.  supportive care    HTN  Hypertensive on admission, likely related to volume overload.  Monitor with diuresis.  cont home amlodipine, hydralazine.  Have hydralazine iv PRN available.    DM2  Peripheral neuropathy  Retinopathy  A1c in march was 9.8  Patient with BG normal with sliding scale alone despite home long acting.  Monitor and discharge on appropriate regimen.  Potentially discontinue home insulin, but will follow as renal function improves.  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 with IVC placed  with plan for lifelong AC given unprovoked and severity.  Feb V/Q without evidence of PE. Was on coumadin  daily resulting in supratherapeutic INR on presentation here.  Now with therapeutic INR    Plan: coumadin 7.5 today    Constipation - RESOLVED  Last BM 2 days prior admission and required enema, no evidence of obstruction, no prior abdominal surgeries, benign abd exam with bowel sounds.  Resolved with aggressive bowel regimen    Iron deficiency anemia  Hemoglobin at baseline of ~8, no evidence of blood loss, receives iron infusions (last dose 5/18), last iron studies in 08/2015 with ferritin 286 and Fe % saturation 15. Iron studies unrevealing  monitor CBC    Left foot fracture  Recent Charcot's neuropathic osteoarthropathy left foot versus neuropathic fracture of the navicular bone and fifth metatarsal. Seen by ortho with worsening osteonecrosis on xray.  Patient must wear orthotics (CAM boot) boot when ambulating.   PT rec SNF  Patient will need follow up ortho outpt ~1wk following discharge.    Vulvar ulcerated lesion  Seen by gynecology on 5/16, swab positive for HSV 2, no Rx found in outside records for acyclovir or other antiviral  consider starting acyclovir or other antiviral if renal function stable  follow up with gynecology    FEN: renal/diabetic diet  DVT Prophylaxis: hep gtt until INR therapeutic 2-3 given unprovoked saddle pe  Code status: Full  Dispo: Medically stable  for discharge back to SNF, [facility] without available beds      Leslie Hernandez A. Edd Fabian, MD  [phone]

## 2015-10-31 NOTE — Allied Health Progress (Signed)
Name: Leslie Hernandez MRN: [id number]    Date of Service: 10/31/2015      Clinical Case Management Progress Note    Comments: Case reviewed, patient with [facility], [facility], desired other SNF but no accepting facility, updated referral [facility] [phone] yesterday, but was still in review at end of day. Call today to admin coordinator Leslie Hernandez, rudely states no available bed, unsure when one will open as this will only happen if someone dies, asked if I should call daily for updates, she then relates no bed hold and will not accept back for variety of issues including behaviors and non compliance. Case discussed with supervisor Leslie Hernandez, she will review.      Further follow-up needed: Coordinate SNF discharge once facility accepts, call back from supervisor as may need to report to the state.    Date: 10/31/2015  Time: 09:55    Electronically Signed by:  Leslie Hernandez, Case Manager, RN  Pager: [phone]

## 2015-10-31 NOTE — Allied Health Procedure (Signed)
Mobility Program Data Extraction Note  (See Physical Therapy Notes for Clinical Information)        Mobility Phase Guidelines: [email or web address]    Last Documented Mobility Phase: Phase 3    Current Phase: Phase 3    Mobility Session Initiated with Physical Therapy?: Yes    Mobility Session Completed with Physical Therapy?: Yes    Mobility Program Status: In Progress      ======================================================================       Phase I  Phase II    Command and physical response activation  Arousal/ orientation/ communication Degree of Interation: Low Cooperation    Command and verbal response activation     Patient and/or caregiver education    Arousal and orientation degree of Interaction: Comatose, Unarousable    Musculoskeletal Program: Advancement (AROM, AAROM, resistive training, metered exercise UE/LE    Musculoskeletal Program: Positioning Head to Feet: prevent subluxation, joint malalignment, manage tone, manage edema, visual-spatial orientation, PROM all limbs: proximal to distal, facilitate basic AROM  Participation in beginning components of bed mobility: Reaching, rolling, active LE      Sensorimotor Program: midline orientation, reflexes, tactile feedback  Positioning Head to Feet: prevent subluxation, joint malalignment, manage tone, manage edema, visual-spatial orientation    Caregiver education and participation as appropriate  Sensorimotor Program: visual attention, midline orientation, righting reactions    Dependent splint/orthotic application  Supported sitting EOB, cardiac chair, wheelchair    Dependent mobilization out of bed (to cardiac chair)  Mobilize out of bed: Passive Transfers Dependent through Max Assist  ?Lift Team indicated    Encourage patient participation with bed-level ADL's: Hygiene/grooming; self-feeding; upper body sponge bathing, upper body dressing  Mobilize out of bed:  Assess transfer type, level of assist, tolerance      Sitting schedule implemented all shifts      EOB: Supported, unsupported or challenged balance activities      Supported sitting exercise: metered exercise UE/LE      Pre-gait training (dependent to moderate assist)      Other mobilization: Tile Table Program      Passive Splint/Orthotic Application      Encourage patient participation with edge of bed ADL's: Hygiene/grooming; self-feeding; upper body sponge bathing/dressing; lower body sponge bathing    Phase III  Phase IV   x Arousal/orientation/communication Degree of Interaction: Moderate Cooperation  Degree of interaction: High Cooperation Education for patient/family    Patient and/or cargiver education as appropriate (incorporate any appropriate activity)  Advanced Musculoskeletal Program: Resistive, metered exercise UE/LE    Musculoskeletal Program: Advancement (AROM, AAROM, resistive training metered exercise UE/LE, Object Manipulation)  Advanced bed mobility/incorporate nursing all shifts     Sensorimotor Program: proprioception feeback, coordination reactions  Sensorimotor Program: timing, skilled voluntary control of limbs, position ; direction change reactions     Caregiver education and participation  Functional transfer training (commode or chair)/incorporate nursing all shifts    Bed mobility advancement  High level balance activities    Sitting balance advancement: Static and dynamic trunk activities  Gait program or (wheelchair mobility for wheelchair-dependent only), incorporate nursing all shifts   x Advance out of bed sitting tolerance  Active splint/orthotic application   x Active assisted transfer training and advancement  Encourage patient participation in ADL's in bathroom setting: Use of regular toilet; ADL's at sink-side; consider use of shower stall    Standing Activities (Pre-gait): Supported/unsupported, Static/dynamic; tilt table      Gait Training/assisted gait      Active  assistive splint/orthotic application       x Encourage Patient participation in seated ADL Activities OOB in bedside chair/wheelchair/ cardiac chair/ bedside commode: Hygiene/grooming; self-feeding; upper body sponge bathing/ dressing; lower Body sponge bathing/ dressing; toileting       Phase D for Pediatric use    ======================================================================    Electronically signed by:       Sherrye Puga, Physical Therapist Assistant  NPI [id number]  Dept. Of Physical Medicine & Rehabilitation / Acute Care Services  Vocera [phone]

## 2015-10-31 NOTE — Plan of Care (Signed)
Problem: Patient Care Overview (Adult)  Goal: Plan of Care Review  Outcome: Ongoing (interventions implemented as appropriate)  Goal Outcome Evaluation Note     Leslie Hernandez is a [age]yr female admitted 10/20/2015      OUTCOME SUMMARY AND PLAN MOVING FORWARD:   Pt slept well thru the night,vss,afebrile,BP was elevated but responded well to Metoprolol dose,[facility],this morning complained of Rt eye pain medicated as ordered.    Problem: Sleep Pattern Disturbance (Adult)  Goal: Adequate Sleep/Rest  Patient will demonstrate the desired outcomes by discharge/transition of care.   Outcome: Ongoing (interventions implemented as appropriate)    Problem: Fall Risk (Adult)  Goal: Absence of Falls  Patient will demonstrate the desired outcomes by discharge/transition of care.   Outcome: Ongoing (interventions implemented as appropriate)    Problem: Mobility, Physical Impaired (Adult)  Goal: Enhanced Mobility Skills  Patient will demonstrate the desired outcomes by discharge/transition of care.   Outcome: Ongoing (interventions implemented as appropriate)  Goal: Enhanced Functionality Ability  Patient will demonstrate the desired outcomes by discharge/transition of care.   Outcome: Ongoing (interventions implemented as appropriate)    Problem: Fluid Volume Excess (Adult,Obstetrics,Pediatric)  Goal: Stable Weight  Patient will demonstrate the desired outcomes by discharge/transition of care.   Outcome: Ongoing (interventions implemented as appropriate)  Goal: Balanced Intake/Output  Patient will demonstrate the desired outcomes by discharge/transition of care.   Outcome: Ongoing (interventions implemented as appropriate)    Problem: Pain, Acute (Adult)  Goal: Acceptable Pain Control/Comfort Level  Patient will demonstrate the desired outcomes by discharge/transition of care.   Outcome: Ongoing (interventions implemented as appropriate)    Problem: Pressure Ulcer Risk (Braden Scale) (Adult,Obstetrics,Pediatric)  Goal:  Skin Integrity  Patient will demonstrate the desired outcomes by discharge/transition of care.   Outcome: Ongoing (interventions implemented as appropriate)

## 2015-11-01 LAB — BASIC METABOLIC PANEL
CALCIUM: 8.9 mg/dL (ref 8.6–10.5)
CARBON DIOXIDE TOTAL: 22 meq/L — AB (ref 24–32)
CHLORIDE: 110 meq/L (ref 95–110)
CREATININE BLOOD: 3.8 mg/dL — AB (ref 0.44–1.27)
E-GFR, AFRICAN AMERICAN: 15 SEE NOTE — AB (ref >60)
E-GFR, NON-AFRICAN AMERICAN: 13 — AB (ref >60)
GLUCOSE: 202 mg/dL — AB (ref 70–99)
POTASSIUM: 3.9 meq/L (ref 3.3–5.0)
SODIUM: 142 meq/L (ref 135–145)
UREA NITROGEN, BLOOD (BUN): 49 mg/dL — AB (ref 8–22)

## 2015-11-01 LAB — CBC WITH DIFFERENTIAL
BASOPHILS % AUTO: 0.7 %
BASOPHILS ABS AUTO: 0 10*3/uL (ref 0–0.2)
EOSINOPHIL % AUTO: 2.9 %
EOSINOPHIL ABS AUTO: 0.2 10*3/uL (ref 0–0.5)
HEMATOCRIT: 29.4 % — AB (ref 34–46)
HEMOGLOBIN: 9.2 g/dL — AB (ref 12.0–16.0)
LYMPHOCYTE ABS AUTO: 1.5 10*3/uL (ref 1.0–4.8)
LYMPHOCYTES % AUTO: 26.3 %
MCH: 22 pg — AB (ref 27–33)
MCHC: 31.4 % — AB (ref 32–36)
MCV: 70.1 UM3 — AB (ref 80–100)
MONOCYTES % AUTO: 7.2 %
MONOCYTES ABS AUTO: 0.4 10*3/uL (ref 0.1–0.8)
MPV: 7.3 UM3 (ref 6.8–10.0)
NEUTROPHIL ABS AUTO: 3.6 10*3/uL (ref 1.80–7.70)
NEUTROPHILS % AUTO: 62.9 %
PLATELET COUNT: 283 10*3/uL (ref 130–400)
PLATELET ESTIMATE, SMEAR: ADEQUATE
RDW: 18.6 U — AB (ref 0–14.7)
RED CELL COUNT: 4.2 10*6/uL (ref 3.7–5.5)
WHITE BLOOD CELL COUNT: 5.7 10*3/uL (ref 4.5–11.0)

## 2015-11-01 LAB — PHOSPHORUS (PO4): PHOSPHORUS (PO4): 4.7 mg/dL (ref 2.4–5.0)

## 2015-11-01 LAB — INR: INR: 2.78 — AB (ref 0.87–1.18)

## 2015-11-01 LAB — MAGNESIUM (MG): MAGNESIUM (MG): 2 mg/dL (ref 1.5–2.6)

## 2015-11-01 MED ORDER — INSULIN GLARGINE 100 UNIT/ML SUB-Q VIAL
5.0000 [IU] | Freq: Every day | SUBCUTANEOUS | Status: DC
Start: 2015-11-01 — End: 2015-11-01

## 2015-11-01 MED ORDER — INSULIN GLARGINE 100 UNIT/ML SUB-Q VIAL
8.0000 [IU] | Freq: Every day | SUBCUTANEOUS | Status: DC
Start: 2015-11-01 — End: 2015-11-18
  Administered 2015-11-01 – 2015-11-17 (×17): 8 [IU] via SUBCUTANEOUS
  Filled 2015-11-01 (×17): qty 8

## 2015-11-01 MED ORDER — WARFARIN 7.5 MG TABLET
7.5000 mg | ORAL_TABLET | ORAL | Status: AC
Start: 2015-11-01 — End: 2015-11-01
  Administered 2015-11-01: 7.5 mg via ORAL
  Filled 2015-11-01 (×2): qty 1

## 2015-11-01 MED ORDER — FUROSEMIDE 80 MG TABLET
80.0000 mg | ORAL_TABLET | Freq: Every day | ORAL | Status: DC
Start: 2015-11-01 — End: 2015-11-18
  Administered 2015-11-01 – 2015-11-18 (×18): 80 mg via ORAL
  Filled 2015-11-01 (×18): qty 1

## 2015-11-01 NOTE — Allied Health Progress (Signed)
Rounds visit on floor    Attempted visit, but pt was sleeping at the time.    Will check back on another rounds visit when patient is available.    Leslie Hernandez  Chaplain  Pager # [phone]

## 2015-11-01 NOTE — Allied Health Progress (Addendum)
Leslie Hernandez MRN: [id number]    Date: 11/01/2015  Time: 12:11    Intensive Case Management Progress Note    Comments: Patient assigned to ICM effective 11/01/15 for d/c planning purposes. [facility], no longer straight [employer].     [facility] and left message requesting call back as insurance has changed and perhaps they can now accommodate patient.     ICM [facility] [phone]) and [facility]. She requested clinicals be faxed to [phone]. ICM notified RN CM. Sonya advised that case manager will be assigned once clinicals are received. ICM provided Sonya with ICM fax number and she will try to find a list of contracted SNFs to fax to ICM.     ICM created new SNF referral and sent out advising [facility]. Awaiting responses.     Further follow-up needed: ICM to continue efforts to secure placement with [facility] funding.     ADDENDUM    Time: 14:46    Reviewed SNF referral; no accepting facilities. Sent message in [facility] to see if they can now accept due to insurance change.           Electronically Signed by:  Doroteo GlassmanKristin Joylene Wescott, MSW  Pager: [phone]

## 2015-11-01 NOTE — Nurse Assessment (Signed)
ASSESSMENT NOTE    Note Started: 11/01/2015, 07:29     Initial assessment completed and recorded in EMR.  Report received from night shift nurse and orders reviewed. Patient resting comfortably, c/o 6/10 R-eye pain.  The plan for the day is pain management and safety. Plan of Care reviewed and appropriate, discussed with patient.   Kari BaarsJoshua Galina Haddox, RN.

## 2015-11-01 NOTE — Plan of Care (Signed)
Problem: Mobility, Physical Impaired (Adult)  Goal: Enhanced Functionality Ability  Patient will demonstrate the desired outcomes by discharge/transition of care.   Pt. A & O x 4  C/O right eye pain and HA.Pain meds working well  Lungs clear bilat lobes  No coughing noted. No c/o chest pain  Sating 96-99% in RA    Problem: Fluid Volume Excess (Adult,Obstetrics,Pediatric)  Goal: Stable Weight  Patient will demonstrate the desired outcomes by discharge/transition of care.   Outcome: Ongoing (interventions implemented as appropriate)    Problem: Pain, Acute (Adult)  Goal: Acceptable Pain Control/Comfort Level  Patient will demonstrate the desired outcomes by discharge/transition of care.   Outcome: Ongoing (interventions implemented as appropriate)    Problem: Pressure Ulcer Risk (Braden Scale) (Adult,Obstetrics,Pediatric)  Goal: Skin Integrity  Patient will demonstrate the desired outcomes by discharge/transition of care.   Outcome: Ongoing (interventions implemented as appropriate)

## 2015-11-01 NOTE — Allied Health Progress (Signed)
Physical Therapy Progress Note    Date of Service: 11/01/2015   Time in: 905  Total time: 45 Minutes      S: 8/10 Pain, located in R eye. No other complaints of pain. Agreeable to treatment. Asked RN to give medication after therapy due to them making her groggy. Reports having difficulty breathing with HOB low    O: RN cleared patient patient for treatment  Supine to sitting EOB with HOB raised, mod I.   Sitting EOB performed therapeutic exercise of long arc quads, hip flexion ankle  ABCs on LLE  Depression scoot transfer in increments with MinA cues to extend LLE during transfer. Patient unable to maintain weight bearing restrictions on LLE  Education provided on increasing OOB sitting tolerance as opposite relates to medical complications (weakness, risk for skin breakdown due decreased circulation and sensation secondary to diabetes  Patient left sitting in wheelchair with needs in reach and RN aware.  Therapist returned to assist patient back to bed, via depression scoot. Performed slightly uphill with SBA. Unable to keep weight off LLE.    A: Patient tolerated sitting in wheelchair x40 minutes, reported feeling nauseas. Receptive of education provided. Unable to maintain NWB on LLE during transfers.     P: Continue with plan of care, improve transfers while maintaining weight bearing precautions    Delma OfficerSharee Destine Ambroise, PTA  PI# [id number]

## 2015-11-01 NOTE — Allied Health Progress (Signed)
Mobility Program Data Extraction Note  (See Physical Therapy Notes for Clinical Information)        Mobility Phase Guidelines: [email or web address]    Last Documented Mobility Phase: Phase 3    Current Phase: phase 3    Mobility Session Initiated with Physical Therapy?: yes    Mobility Session Completed with Physical Therapy?:yes    Mobility Program Status: in progress      ======================================================================       Phase I  Phase II    Command and physical response activation  Arousal/ orientation/ communication Degree of Interation: Low Cooperation    Command and verbal response activation     Patient and/or caregiver education    Arousal and orientation degree of Interaction: Comatose, Unarousable    Musculoskeletal Program: Advancement (AROM, AAROM, resistive training, metered exercise UE/LE    Musculoskeletal Program: Positioning Head to Feet: prevent subluxation, joint malalignment, manage tone, manage edema, visual-spatial orientation, PROM all limbs: proximal to distal, facilitate basic AROM  Participation in beginning components of bed mobility: Reaching, rolling, active LE      Sensorimotor Program: midline orientation, reflexes, tactile feedback  Positioning Head to Feet: prevent subluxation, joint malalignment, manage tone, manage edema, visual-spatial orientation    Caregiver education and participation as appropriate  Sensorimotor Program: visual attention, midline orientation, righting reactions    Dependent splint/orthotic application  Supported sitting EOB, cardiac chair, wheelchair    Dependent mobilization out of bed (to cardiac chair)  Mobilize out of bed: Passive Transfers Dependent through Max Assist  ?Lift Team indicated    Encourage patient participation with bed-level ADL's: Hygiene/grooming; self-feeding; upper body sponge bathing, upper body dressing  Mobilize out of bed:  Assess transfer type, level of assist, tolerance      Sitting schedule implemented all shifts      EOB: Supported, unsupported or challenged balance activities      Supported sitting exercise: metered exercise UE/LE      Pre-gait training (dependent to moderate assist)      Other mobilization: Tile Table Program      Passive Splint/Orthotic Application      Encourage patient participation with edge of bed ADL's: Hygiene/grooming; self-feeding; upper body sponge bathing/dressing; lower body sponge bathing    Phase III  Phase IV   xx Arousal/orientation/communication Degree of Interaction: Moderate Cooperation  Degree of interactionPsychologist, forensic: High Cooperation Education for patient/family    Patient and/or cargiver education as appropriate (incorporate any appropriate activity)  Advanced Musculoskeletal Program: Resistive, metered exercise UE/LE   x Musculoskeletal Program: Advancement (AROM, AAROM, resistive training metered exercise UE/LE, Object Manipulation)  Advanced bed mobility/incorporate nursing all shifts     Sensorimotor Program: proprioception feeback, coordination reactions  Sensorimotor Program: timing, skilled voluntary control of limbs, position ; direction change reactions    x Caregiver education and participation  Functional transfer training (commode or chair)/incorporate nursing all shifts   x Bed mobility advancement  High level balance activities    Sitting balance advancement: Static and dynamic trunk activities  Gait program or (wheelchair mobility for wheelchair-dependent only), incorporate nursing all shifts    Advance out of bed sitting tolerance  Active splint/orthotic application    Active assisted transfer training and advancement  Encourage patient participation in ADL's in bathroom setting: Use of regular toilet; ADL's at sink-side; consider use of shower stall    Standing Activities (Pre-gait): Supported/unsupported, Static/dynamic; tilt table      Gait Training/assisted gait      Active  assistive splint/orthotic application  Encourage Patient participation in seated ADL Activities OOB in bedside chair/wheelchair/ cardiac chair/ bedside commode: Hygiene/grooming; self-feeding; upper body sponge bathing/ dressing; lower Body sponge bathing/ dressing; toileting       Phase D for Pediatric use    ======================================================================    Electronically signed by:     Ocie Bob, PTA  PI# [id number]

## 2015-11-01 NOTE — Plan of Care (Signed)
Problem: Patient Care Overview (Adult)  Goal: Plan of Care Review  Outcome: Ongoing (interventions implemented as appropriate)  Goal Outcome Evaluation Note     Leslie Hernandez is a [age]yr female admitted 10/20/2015      OUTCOME SUMMARY AND PLAN MOVING FORWARD:      Pt hypertensive, but stable. Pain well managed on current regimen. Pt working with PT today, pt tolerated well. Possible d/c to snf tomorrow. Will continue to monitor.         Goal: Individualization and Mutuality  Outcome: Ongoing (interventions implemented as appropriate)  Goal: Discharge Needs Assessment  Outcome: Ongoing (interventions implemented as appropriate)    Problem: Sleep Pattern Disturbance (Adult)  Goal: Adequate Sleep/Rest  Patient will demonstrate the desired outcomes by discharge/transition of care.   Outcome: Ongoing (interventions implemented as appropriate)    Problem: Fall Risk (Adult)  Goal: Absence of Falls  Patient will demonstrate the desired outcomes by discharge/transition of care.   Outcome: Ongoing (interventions implemented as appropriate)    Problem: Mobility, Physical Impaired (Adult)  Goal: Enhanced Mobility Skills  Patient will demonstrate the desired outcomes by discharge/transition of care.   Outcome: Ongoing (interventions implemented as appropriate)  Goal: Enhanced Functionality Ability  Patient will demonstrate the desired outcomes by discharge/transition of care.   Outcome: Ongoing (interventions implemented as appropriate)    Problem: Fluid Volume Excess (Adult,Obstetrics,Pediatric)  Goal: Stable Weight  Patient will demonstrate the desired outcomes by discharge/transition of care.   Outcome: Ongoing (interventions implemented as appropriate)  Goal: Balanced Intake/Output  Patient will demonstrate the desired outcomes by discharge/transition of care.   Outcome: Ongoing (interventions implemented as appropriate)    Problem: Pain, Acute (Adult)  Goal: Acceptable Pain Control/Comfort Level  Patient will demonstrate the  desired outcomes by discharge/transition of care.   Outcome: Ongoing (interventions implemented as appropriate)    Problem: Pressure Ulcer Risk (Braden Scale) (Adult,Obstetrics,Pediatric)  Goal: Skin Integrity  Patient will demonstrate the desired outcomes by discharge/transition of care.   Outcome: Ongoing (interventions implemented as appropriate)

## 2015-11-01 NOTE — Nurse Assessment (Signed)
ASSESSMENT NOTE    Note Started: 11/01/2015, 02:44     Initial assessment completed and recorded in EMR.  Report received from day shift nurse and orders reviewed. Plan of Care reviewed and appropriate, discussed with patient.  Lahna Nath,  RN

## 2015-11-01 NOTE — Progress Notes (Signed)
INTERNAL MEDICINE DAILY PROGRESS NOTE  Date: 11/01/2015  Time:  12:45        Leslie Hernandez is a 50yo F with DM2, CKD4, PE on coumadin, dCHF, and HTN who presented 5/20 for progressive right eye vision loss 2/2 vitreal hemorrhage about to undergo vitrectomy but found to have supratherapeutic INR in PACU so sent to ED where she was found to have dyspnea, cough, rhinorrhea, nausea, and vomiting with an INR 4.87. She was found to have a Rhinovirus URI as well as decompensated CHF and subsequently admitted for further medical management.     INTERVAL HISTORY:    INR 2.78    SUBJECTIVE:    Nausea, Vomiting resolved    MEDICATIONS:  Scheduled Medications  Amlodipine (NORVASC) Tablet 10 mg, ORAL, QAM  Atorvastatin (LIPITOR) Tablet 80 mg, ORAL, Daily Bedtime  Brimonidine (ALPHAGAN) 0.2 % Ophthalmic Solution 1 drop, BOTH Eyes, TID  Docusate (COLACE) Capsule 100 mg, ORAL, BID  FUROsemide (LASIX) Tablet 80 mg, ORAL, QAM  Gabapentin (NEURONTIN) Capsule 100 mg, ORAL, TID  HydrALAZINE (APRESOLINE) Tablet 100 mg, ORAL, TID  Insulin Aspart (NOVOLOG) Injection Pen 1-4 Units, SUBCUTANEOUS, Daily Bedtime  Insulin Aspart (NOVOLOG) Injection Pen 1-6 Units, SUBCUTANEOUS, TID w/ meals - correction dose  Insulin Glargine (LANTUS) Injection 5 Units, SUBCUTANEOUS, Daily Bedtime  Lidocaine (LIDODERM) 5 %(700 mg/patch) Patch 2 patch, Transdermal, Q24H Now  Lidocaine patch REMOVAL 1 patch, Transdermal, Q24H Now  Neomycin/Polymyxin/Dexamethasone (MAXITROL) Ophthalmic Ointment 0.5 inch, RIGHT Eye, QID  Polyethylene Glycol 3350 (MIRALAX) Oral Powder Packet 17 g, ORAL, QAM  Sennosides (SENOKOT) Tablet 17.2 mg, ORAL, Daily Bedtime  Warfarin Patient Flag, NOT APPLICABLE, PATIENT FLAG ONLY    IV Medications   PRN Medications  Acetaminophen (TYLENOL) Tablet 650 mg, ORAL, Q4H PRN  Bisacodyl (DULCOLAX) Suppository 10 mg, RECTALLY, Q24H PRN  Dextrose 50% Injection 12.5 g, IV, PRN  Dextrose 50% Injection 25 g, IV, PRN  DiphenhydrAMINE (BENADRYL) Capsule 25 mg,  ORAL, Q6H PRN  Glucagon Injection 1 mg, IM, PRN  HydrALAZINE (APRESOLINE) Injection 10 mg, IV, Q4H PRN  Hydromorphone (DILAUDID) Injection 1-1.5 mg, IV, Q3H PRN  Hydromorphone (DILAUDID) Tablet 2 mg, ORAL, Q4H PRN  Lactulose (CHRONULAC) 10 gram/15 mL Solution 30 mL, ORAL, Q6H PRN  Mineral Oil (FLEET) Enema 1 enema, RECTALLY, Bedtime PRN  Ondansetron (ZOFRAN) Injection 4 mg, IV, Q8H PRN  Promethazine (PHENERGAN) Injection 12.5 mg, IV, Q6H PRN        OBJECTIVE:   Vital Signs  Summary  Temp Min: 36.4 C (97.5 F) Max: 37.1 C (98.8 F)  BP: (151-165)/(80-106)   Pulse Min: 76 Max: 99  Resp Min: 18 Max: 18  SpO2 Min: 98 % Max: 100 %      Current Vitals  Temp: 36.9 C (98.5 F)  BP: (!) 165/92  Pulse: 94  Resp: 18  SpO2: 100 %      Weight: 111.8 kg (246 lb 8 oz)     Intake and Output  Last Two Completed Shifts  In: 650 [Oral:650]  Out: 1200 [Urine:1200]    Current Shift  In: 240 [Oral:240]  Out: 350 [Urine:350]      Physical Exam  General: obese female, No acute distress, conversational. A+Ox3  HEENT:  Moist oral mucosa.  Right occluded with bandages c/d/i  CV: Regular rate and rhythm, nl s1s2, no murmurs, rubs or gallops. No JVD.  Pulm: CTAB  good symmetric air movement  Abd: Soft NT ND, +BS. No rebound or guarding  Skin: No rashes. No jaundice.  Neuro: face symmetric, moving all 4 extremities spontaneously.  Left foot with TTP along lateral aspect.  Extrem:  1+ LE edema bilaterally.    LINES AND DRAINS:   piv    LAB TESTS/STUDIES:     I personally reviewed the following report(s)  .    Lab Results - 24 hours (excluding micro and POC)   BASIC METABOLIC PANEL     Status: Abnormal   Result Value Status    SODIUM 142 Final    POTASSIUM 3.9 Final    CHLORIDE 110 Final    CARBON DIOXIDE TOTAL 22 (L) Final    UREA NITROGEN, BLOOD (BUN) 49 (H) Final    CREATININE BLOOD 3.80 (H) Final    E-GFR, AFRICAN AMERICAN 15 (L) Final    E-GFR, NON-AFRICAN AMERICAN 13 (L) Final    GLUCOSE 202 (H) Final    CALCIUM 8.9 Final    MAGNESIUM (MG)     Status: None   Result Value Status    MAGNESIUM (MG) 2.0 Final   PHOSPHORUS (PO4)     Status: None   Result Value Status    PHOSPHORUS (PO4) 4.7 Final   INR     Status: Abnormal   Result Value Status    INR 2.78 (H) Final   CBC WITH DIFFERENTIAL     Status: Abnormal   Result Value Status    WHITE BLOOD CELL COUNT 5.7 Final    RED CELL COUNT 4.20 Final    HEMOGLOBIN 9.2 (L) Final    HEMATOCRIT 29.4 (L) Final    MCV 70.1 (L) Final    MCH 22.0 (L) Final    MCHC 31.4 (L) Final    RDW 18.6 (H) Final    MPV 7.3 Final    PLATELET COUNT 283 Final    NEUTROPHILS % AUTO 62.9 Final    LYMPHOCYTES % AUTO 26.3 Final    MONOCYTES % AUTO 7.2 Final    EOSINOPHIL % AUTO 2.9 Final    BASOPHILS % AUTO 0.7 Final    NEUTROPHIL ABS AUTO 3.60 Final    LYMPHOCYTE ABS AUTO 1.5 Final    MONOCYTES ABS AUTO 0.4 Final    EOSINOPHIL ABS AUTO 0.2 Final    BASOPHILS ABS AUTO 0 Final    ANISOCYTOSIS MOD (Abnl) Final    POIKILOCYTOSIS SL Final    MICROCYTOSIS MOD Final    OVALOCYTES SL Final    LEFT SHIFT NOT PRESENT Final    PLATELET ESTIMATE, SMEAR Adequate Final     INFLUENZA A Negative  Negative  Final     INFLUENZA B Negative  Negative  Final    RESPIRATORY SYNCYTIAL VIRUS Negative  Negative  Final    PARAINFLUENZA Negative  Negative  Final    CORONAVIRUS Negative  Negative  Final    HUMAN METAPNEUMOVIRUS Negative  Negative  Final    RHINOVIRUS/ENTEROVIRUS POSITIVE (Abnl) Negative  Final    ADENOVIRUS Negative  Negative  Final    HUMAN BOCAVIRUS Negative  Negative  Final    CHLAMYDOPHILA PNEUMONIAE Negative  Negative  Final    MYCOPLASMA PNEUMONIAE Negative  Negative  Final    Comment:       Xray left foot:  Flattening and fragmentation of the navicular bone, compatible with  subacute fracture. Regions of sclerosis within the navicular bone likely  reflect osteonecrosis.  Old healed fracture of the proximal fifth metatarsal.      ASSESSMENT & PLAN:    50 year old female past medical  history of DM-2, CKD-4, PE on  warfarin, dCHF, HTN with CHF exacerbation and subacute vitreal hemorrhage requiring ophtho retinal surgery which occurred but delayed due to supratherapeutic INR when admitted.    Nausea, vomiting  Suspected viral gastroenteritis, improved symptomatically  Plan: supportive care, phenergan, zofran    Glaucoma  vitreal hemorrhage  diabetic retinopathy  Right eye blindness  Transferred to [place] for urgent retinal surgery for elevated IOP, underwent anterior chamber tap with improved IOP at [facility], due to having surgery but held off due to elevated INR.  Procedure performed by [place] optho 10/25/15.  Continue brimonidine, maxitrol, and atropine (unable to obtain 2/2 shortage)  Retina f/u postprocedurally (friday will be 1 week f/u)    AKI on CKD  Acute on chronic heart failure, diastolic  Pericardial effusion  Patient with baseline Cr 2.3.  Presented with Cr 3.5 rose to 3.7 with diureisis, improved with hydration and now with evidence of peripheral volume overload    Plan:   renally dose medications and avoid nephrotoxins  Monitor Cr  continue lasix 80mg  po daily  Fluid restriction, daily weight    Anemia  Her blood counts are erratic but I think her baseline is around 8-9  Plan: repeat CBC with T&S    Cough  rhinorhea  Patient with rhinovirus.  Patient given vanc/cefepime in ED, but no evidence pneumonia and with viral URI.  Monitor off abx.  supportive care    HTN  Hypertensive on admission, likely related to volume overload.  Monitor with diuresis.  cont home amlodipine, hydralazine.  Have hydralazine iv PRN available.    DM2  Peripheral neuropathy  Retinopathy  A1c in march was 9.8, sugars consistently above 200's  Patient with BG normal with sliding scale alone despite home long acting.  Monitor and discharge on appropriate regimen.  but will follow as renal function improves.  A1C=7.6%  Gabapentin reduced to 100mg  TID given renal function    Plan:   Add lantus 8 units QHS  CDI    PE (saddle embolus) status post IVC  filter  supratherapeutic INR  The PE was diagnosed 3 years ago at [facility] with IVC placed with plan for lifelong AC given unprovoked and severity.  Feb V/Q without evidence of PE. Was on coumadin 20mg  daily resulting in supratherapeutic INR on presentation here.  Now with therapeutic INR    Plan: coumadin 7.5 today    Constipation - RESOLVED  Last BM 2 days prior admission and required enema, no evidence of obstruction, no prior abdominal surgeries, benign abd exam with bowel sounds.  Resolved with aggressive bowel regimen    Iron deficiency anemia  Hemoglobin at baseline of ~8, no evidence of blood loss, receives iron infusions (last dose 5/18), last iron studies in 08/2015 with ferritin 286 and Fe % saturation 15. Iron studies unrevealing  monitor CBC    Left foot fracture  Recent Charcot's neuropathic osteoarthropathy left foot versus neuropathic fracture of the navicular bone and fifth metatarsal. Seen by ortho with worsening osteonecrosis on xray.  Patient must wear orthotics (CAM boot) boot when ambulating.   PT rec SNF  Patient will need follow up ortho outpt ~1wk following discharge.    Vulvar ulcerated lesion  Seen by gynecology on 5/16, swab positive for HSV 2, no Rx found in outside records for acyclovir or other antiviral  consider starting acyclovir or other antiviral if renal function stable  follow up with gynecology    FEN: renal/diabetic diet  DVT  Prophylaxis: hep gtt until INR therapeutic 2-3 given unprovoked saddle pe  Code status: Full  Dispo: Medically stable for [facility]      Malee Grays A. Edd Fabiankamoto, MD  [phone]

## 2015-11-01 NOTE — Allied Health Progress (Signed)
Name: Leslie Hernandez MRN: [id number]    Date of Service: 11/01/15      Clinical Case Management Progress Note    Comments: received a call from business office. As of today pt has [employer] insurance. I faxed all reviews to [facility]  [phone]. [facility] was not aware pt. Was here and wanted the reviews and she has not been assigned a CM yet. [facility] will call us when they have a CM assigned.     Further follow-up needed: cm will continue to work with [employer].     Date: 11/01/2015  Time: 15:06    Electronically Signed by:  Alda Leascott, Kandon Hosking, RN,   Pager: [phone]

## 2015-11-02 LAB — CBC WITH DIFFERENTIAL
BASOPHILS % AUTO: 0.9 %
BASOPHILS ABS AUTO: 0 10*3/uL (ref 0–0.2)
EOSINOPHIL % AUTO: 2.9 %
EOSINOPHIL ABS AUTO: 0.2 10*3/uL (ref 0–0.5)
HEMATOCRIT: 28.5 % — AB (ref 34–46)
HEMOGLOBIN: 8.8 g/dL — AB (ref 12.0–16.0)
LYMPHOCYTE ABS AUTO: 1.4 10*3/uL (ref 1.0–4.8)
LYMPHOCYTES % AUTO: 24.3 %
MCH: 21.5 pg — AB (ref 27–33)
MCHC: 30.8 % — AB (ref 32–36)
MCV: 69.7 UM3 — AB (ref 80–100)
MONOCYTES % AUTO: 6.3 %
MONOCYTES ABS AUTO: 0.4 10*3/uL (ref 0.1–0.8)
MPV: 7.2 UM3 (ref 6.8–10.0)
NEUTROPHIL ABS AUTO: 3.7 10*3/uL (ref 1.80–7.70)
NEUTROPHILS % AUTO: 65.6 %
PLATELET COUNT: 275 10*3/uL (ref 130–400)
PLATELET ESTIMATE, SMEAR: ADEQUATE
RDW: 19.1 U — AB (ref 0–14.7)
RED CELL COUNT: 4.09 10*6/uL (ref 3.7–5.5)
WHITE BLOOD CELL COUNT: 5.6 10*3/uL (ref 4.5–11.0)

## 2015-11-02 LAB — BASIC METABOLIC PANEL
CALCIUM: 8.5 mg/dL — AB (ref 8.6–10.5)
CARBON DIOXIDE TOTAL: 22 meq/L — AB (ref 24–32)
CHLORIDE: 108 meq/L (ref 95–110)
CREATININE BLOOD: 3.8 mg/dL — AB (ref 0.44–1.27)
E-GFR, AFRICAN AMERICAN: 15 SEE NOTE — AB (ref >60)
E-GFR, NON-AFRICAN AMERICAN: 13 — AB (ref >60)
GLUCOSE: 229 mg/dL — AB (ref 70–99)
POTASSIUM: 4.1 meq/L (ref 3.3–5.0)
SODIUM: 139 meq/L (ref 135–145)
UREA NITROGEN, BLOOD (BUN): 56 mg/dL — AB (ref 8–22)

## 2015-11-02 LAB — PHOSPHORUS (PO4): PHOSPHORUS (PO4): 4.9 mg/dL (ref 2.4–5.0)

## 2015-11-02 LAB — INR: INR: 2.83 — AB (ref 0.87–1.18)

## 2015-11-02 LAB — MAGNESIUM (MG): MAGNESIUM (MG): 1.9 mg/dL (ref 1.5–2.6)

## 2015-11-02 MED ORDER — WARFARIN 7.5 MG TABLET
7.5000 mg | ORAL_TABLET | ORAL | Status: AC
Start: 2015-11-02 — End: 2015-11-02
  Administered 2015-11-02: 7.5 mg via ORAL
  Filled 2015-11-02: qty 1

## 2015-11-02 NOTE — Nurse Assessment (Signed)
ASSESSMENT NOTE    Note Started: 11/02/2015, 19:38     Initial assessment completed and recorded in EMR. Pt calm and cooperative, AOx4, VSS, no pain. Goals for today include rest and safety. Report received from day shift nurse and orders reviewed. Plan of Care reviewed and appropriate, discussed with patient.  Salena Sanereok Emile Ringgenberg, RN RN

## 2015-11-02 NOTE — Plan of Care (Signed)
Problem: Patient Care Overview (Adult)  Goal: Plan of Care Review  Outcome: Ongoing (interventions implemented as appropriate)  Goal Outcome Evaluation Note     Leslie Hernandez is a [age]yr female admitted 10/20/2015      OUTCOME SUMMARY AND PLAN MOVING FORWARD:      VSS. NAC. Pain management still a work-in-progress. Pt sleeping between care much of day. Awaiting placement to snf via [facility]. Will continue to monitor.         Goal: Individualization and Mutuality  Outcome: Ongoing (interventions implemented as appropriate)  Goal: Discharge Needs Assessment  Outcome: Ongoing (interventions implemented as appropriate)    Problem: Sleep Pattern Disturbance (Adult)  Goal: Adequate Sleep/Rest  Patient will demonstrate the desired outcomes by discharge/transition of care.   Outcome: Ongoing (interventions implemented as appropriate)    Problem: Fall Risk (Adult)  Goal: Absence of Falls  Patient will demonstrate the desired outcomes by discharge/transition of care.   Outcome: Ongoing (interventions implemented as appropriate)    Problem: Mobility, Physical Impaired (Adult)  Goal: Enhanced Mobility Skills  Patient will demonstrate the desired outcomes by discharge/transition of care.   Outcome: Ongoing (interventions implemented as appropriate)  Goal: Enhanced Functionality Ability  Patient will demonstrate the desired outcomes by discharge/transition of care.   Outcome: Ongoing (interventions implemented as appropriate)    Problem: Fluid Volume Excess (Adult,Obstetrics,Pediatric)  Goal: Stable Weight  Patient will demonstrate the desired outcomes by discharge/transition of care.   Outcome: Ongoing (interventions implemented as appropriate)  Goal: Balanced Intake/Output  Patient will demonstrate the desired outcomes by discharge/transition of care.   Outcome: Ongoing (interventions implemented as appropriate)    Problem: Pain, Acute (Adult)  Goal: Acceptable Pain Control/Comfort Level  Patient will demonstrate the desired  outcomes by discharge/transition of care.   Outcome: Ongoing (interventions implemented as appropriate)    Problem: Pressure Ulcer Risk (Braden Scale) (Adult,Obstetrics,Pediatric)  Goal: Skin Integrity  Patient will demonstrate the desired outcomes by discharge/transition of care.   Outcome: Ongoing (interventions implemented as appropriate)

## 2015-11-02 NOTE — Allied Health Progress (Signed)
Name: Leslie Hernandez MRN: [id number]    Date of Service:   11/02/2015      Clinical Case Management Progress Note    Comments:     [facility] [phone] admissions to provide updated insurance information and to see if they have any available bed.  Pt was there prior to hospitalization and they had stated she had behavioral problems.     ICM spoke to [facility] Case Manager Merry ProudBrandi [phone], they will not approve a direct admit as pt current health care needs do not require her to be in the hospital.  Merry ProudBrandi will send over SNF paperwork for our MD to fill out and they can assist in locating SNF for pt.     Further follow-up needed:     ICM to continue to look into placement options for pt.     Date: 11/02/2015  Time: 11:14    Electronically Signed by:  Basil Dessamille Marinna Blane, Case Manager, LCSW  Pager: [phone]

## 2015-11-02 NOTE — Progress Notes (Signed)
Ophthalmology Retina Progress Note    50 y/o female who is POW#1 s/p vitrectomy for ghost cell glaucoma OD. Patient notes right brown pain, possibly headache. No vision changes since last assessment. Currently receiving brimonidine and Maxitrol (no atropine drops available in hospital).    Exam    VA cc OD: 20/200    IOP OD: 21    Bedside Anterior Segment Exam OD  L/L: flat  C/S: trace injection over sclerotomy sites, w/q otherwise  K: grossly clear  AC: formed  Iris: round, dilated  Lens: PCIOL  Vit: s/p vtx, mild VH    Dilated Fundus Exam OD  Nerve: slightly hazy view, perfused  Macula: grossly flat, slightly hazy view  Vessels: slightly attenuated  Periphery: full PRP, attached    Assessment: 50 y/o female POW#1 s/p pars plana vitrectomy and endolaser OD for ghost cell glaucoma  - retina attached with mild vitreous hemorrhage, IOP within normal limits on brimonidine therapy    Plan:  1. Continue brimonidine gtts both eyes TID  2. Taper Maxitrol OD to BID x 2 more weeks  3. Shield qhs x 1 more week  4. [facility] ([facility] Willaim [facility]Park [facility]    Quin HoopSophia Ramell Wacha, MD  Fellow, Vitreoretinal Surgery  Department of Ophthalmology and Vision Science

## 2015-11-02 NOTE — Allied Health Progress (Signed)
NUTRITION SCREENING     Admission Date: 10/20/2015   Date of Service: 11/02/2015, 11:00     Patient reports fair to good appetite, tolerating diet, has c/o nausea which affects her PO intake.  Patient with average intake of 69% meals over past 7 days.  Patient also reports she likes to drink Nepro supplement, sips between meals, able to drink at least 1.5 cans/day.  PO intake appears adequate to met patient's nutrition needs.  Encouraged patient to request anti nausea medication as needed, ordered PRN.  Patient verbalizes understanding.  Patient's food preferences discussed and entered in EMR.  + BM 5/29    Patient does not present at acute nutrition risk. No further nutrition intervention is warranted at this time. Will continue routine nutrition monitoring. Please consult Registered Dietitian if acute nutrition issues arise.    Report Electronically Signed By: Earnestine Leys, MS, RD, CDE pager [phone]

## 2015-11-02 NOTE — Nurse Assessment (Signed)
ASSESSMENT NOTE    Note Started: 11/02/2015, 07:00     Initial assessment completed and recorded in EMR.  Report received from night shift nurse and orders reviewed. Patient reports 8/10 r-eye pain.  The plan for the day is adl's and pain management.   Plan of Care reviewed and appropriate, discussed with patient.   Kari BaarsJoshua Timothy Townsel, RN.

## 2015-11-02 NOTE — Nurse Assessment (Signed)
ASSESSMENT NOTE    Note Started: 11/02/2015, 02:52     Initial assessment completed and recorded in EMR.  Report received from day shift nurse and orders reviewed. Plan of Care reviewed and appropriate, discussed with patient.  Kaelyn Nauta,  RN

## 2015-11-02 NOTE — Plan of Care (Signed)
Problem: Patient Care Overview (Adult)  Goal: Plan of Care Review  Outcome: Ongoing (interventions implemented as appropriate)  Goal Outcome Evaluation Note     Leslie Hernandez is a 89yr female admitted 10/20/2015      OUTCOME SUMMARY AND PLAN MOVING FORWARD:   Pt. A & O  x4  Still c/o R eye pain.  Concerned about her Right eye vision not coming back and persistent pain.  Opthalmology saw her last night  Right eye appears less reddened and healing well from the outside look.  VS stable  NO SOB, NO coughing, sating 99-100% in RA    Problem: Sleep Pattern Disturbance (Adult)  Goal: Adequate Sleep/Rest  Patient will demonstrate the desired outcomes by discharge/transition of care.   Outcome: Ongoing (interventions implemented as appropriate)    Problem: Mobility, Physical Impaired (Adult)  Goal: Enhanced Functionality Ability  Patient will demonstrate the desired outcomes by discharge/transition of care.   Outcome: Ongoing (interventions implemented as appropriate)    Problem: Fluid Volume Excess (Adult,Obstetrics,Pediatric)  Goal: Stable Weight  Patient will demonstrate the desired outcomes by discharge/transition of care.   Outcome: Ongoing (interventions implemented as appropriate)  Goal: Balanced Intake/Output  Patient will demonstrate the desired outcomes by discharge/transition of care.   Outcome: Ongoing (interventions implemented as appropriate)    Problem: Pain, Acute (Adult)  Goal: Acceptable Pain Control/Comfort Level  Patient will demonstrate the desired outcomes by discharge/transition of care.   Outcome: Ongoing (interventions implemented as appropriate)    Problem: Pressure Ulcer Risk (Braden Scale) (Adult,Obstetrics,Pediatric)  Goal: Skin Integrity  Patient will demonstrate the desired outcomes by discharge/transition of care.   Outcome: Ongoing (interventions implemented as appropriate)

## 2015-11-02 NOTE — Progress Notes (Signed)
INTERNAL MEDICINE DAILY PROGRESS NOTE  Date: 11/02/2015  Time:  14:32        Leslie Hernandez is a 50yo F with DM2, CKD4, PE on coumadin, dCHF, and HTN who presented 5/20 for progressive right eye vision loss 2/2 vitreal hemorrhage about to undergo vitrectomy but found to have supratherapeutic INR in PACU so sent to ED where she was found to have dyspnea, cough, rhinorrhea, nausea, and vomiting with an INR 4.87. She was found to have a Rhinovirus URI as well as decompensated CHF and subsequently admitted for further medical management.     INTERVAL HISTORY:    INR 2.83  Optho follow up at 1 week completed 6/2    SUBJECTIVE:    Nausea, Vomiting resolved    MEDICATIONS:  Scheduled Medications  Amlodipine (NORVASC) Tablet 10 mg, ORAL, QAM  Atorvastatin (LIPITOR) Tablet 80 mg, ORAL, Daily Bedtime  Brimonidine (ALPHAGAN) 0.2 % Ophthalmic Solution 1 drop, BOTH Eyes, TID  Docusate (COLACE) Capsule 100 mg, ORAL, BID  FUROsemide (LASIX) Tablet 80 mg, ORAL, QAM  Gabapentin (NEURONTIN) Capsule 100 mg, ORAL, TID  HydrALAZINE (APRESOLINE) Tablet 100 mg, ORAL, TID  Insulin Aspart (NOVOLOG) Injection Pen 1-4 Units, SUBCUTANEOUS, Daily Bedtime  Insulin Aspart (NOVOLOG) Injection Pen 1-6 Units, SUBCUTANEOUS, TID w/ meals - correction dose  Insulin Glargine (LANTUS) Injection 8 Units, SUBCUTANEOUS, Daily Bedtime  Lidocaine (LIDODERM) 5 %(700 mg/patch) Patch 2 patch, Transdermal, Q24H Now  Lidocaine patch REMOVAL 1 patch, Transdermal, Q24H Now  Neomycin/Polymyxin/Dexamethasone (MAXITROL) Ophthalmic Ointment 0.5 inch, RIGHT Eye, QID  Polyethylene Glycol 3350 (MIRALAX) Oral Powder Packet 17 g, ORAL, QAM  Sennosides (SENOKOT) Tablet 17.2 mg, ORAL, Daily Bedtime  Warfarin Patient Flag, NOT APPLICABLE, PATIENT FLAG ONLY    IV Medications   PRN Medications  Acetaminophen (TYLENOL) Tablet 650 mg, ORAL, Q4H PRN  Bisacodyl (DULCOLAX) Suppository 10 mg, RECTALLY, Q24H PRN  Dextrose 50% Injection 12.5 g, IV, PRN  Dextrose 50% Injection 25 g, IV,  PRN  DiphenhydrAMINE (BENADRYL) Capsule 25 mg, ORAL, Q6H PRN  Glucagon Injection 1 mg, IM, PRN  HydrALAZINE (APRESOLINE) Injection 10 mg, IV, Q4H PRN  Hydromorphone (DILAUDID) Injection 1-1.5 mg, IV, Q3H PRN  Hydromorphone (DILAUDID) Tablet 2 mg, ORAL, Q4H PRN  Lactulose (CHRONULAC) 10 gram/15 mL Solution 30 mL, ORAL, Q6H PRN  Mineral Oil (FLEET) Enema 1 enema, RECTALLY, Bedtime PRN  Ondansetron (ZOFRAN) Injection 4 mg, IV, Q8H PRN  Promethazine (PHENERGAN) Injection 12.5 mg, IV, Q6H PRN        OBJECTIVE:   Vital Signs  Summary  Temp Min: 36.8 C (98.3 F) Max: 37.1 C (98.7 F)  BP: (136-196)/(64-97)   Pulse Min: 83 Max: 99  Resp Min: 18 Max: 20  SpO2 Min: 97 % Max: 99 %      Current Vitals  Temp: 36.9 C (98.4 F)  BP: 151/87  Pulse: 99  Resp: 20  SpO2: 97 %      Weight: 111.8 kg (246 lb 8 oz)     Intake and Output  Last Two Completed Shifts  In: 1000 [Oral:1000]  Out: 1300 [Urine:1300]    Current Shift  In: 440 [Oral:440]  Out: 1300 [Urine:1250; Emesis:50]      Physical Exam  General: obese female, No acute distress, conversational. A+Ox3  HEENT:  Moist oral mucosa.  Right occluded with bandages c/d/i  CV: Regular rate and rhythm, nl s1s2, no murmurs, rubs or gallops. No JVD.  Pulm: CTAB  good symmetric air movement  Abd: Soft NT ND, +BS. No  rebound or guarding  Skin: No rashes. No jaundice.  Neuro: face symmetric, moving all 4 extremities spontaneously.  Left foot with TTP along lateral aspect.  Extrem:  1+ LE ankle edema b/l.    LINES AND DRAINS:   piv    LAB TESTS/STUDIES:     I personally reviewed the following report(s)  .    Lab Results - 24 hours (excluding micro and POC)   BASIC METABOLIC PANEL     Status: Abnormal   Result Value Status    SODIUM 139 Final    POTASSIUM 4.1 Final    CHLORIDE 108 Final    CARBON DIOXIDE TOTAL 22 (L) Final    UREA NITROGEN, BLOOD (BUN) 56 (H) Final    CREATININE BLOOD 3.80 (H) Final    E-GFR, AFRICAN AMERICAN 15 (L) Final    E-GFR, NON-AFRICAN AMERICAN 13 (L)  Final    GLUCOSE 229 (H) Final    CALCIUM 8.5 (L) Final   MAGNESIUM (MG)     Status: None   Result Value Status    MAGNESIUM (MG) 1.9 Final   PHOSPHORUS (PO4)     Status: None   Result Value Status    PHOSPHORUS (PO4) 4.9 Final   INR     Status: Abnormal   Result Value Status    INR 2.83 (H) Final   CBC WITH DIFFERENTIAL     Status: Abnormal   Result Value Status    WHITE BLOOD CELL COUNT 5.6 Final    RED CELL COUNT 4.09 Final    HEMOGLOBIN 8.8 (L) Final    HEMATOCRIT 28.5 (L) Final    MCV 69.7 (L) Final    MCH 21.5 (L) Final    MCHC 30.8 (L) Final    RDW 19.1 (H) Final    MPV 7.2 Final    PLATELET COUNT 275 Final    NEUTROPHILS % AUTO 65.6 Final    LYMPHOCYTES % AUTO 24.3 Final    MONOCYTES % AUTO 6.3 Final    EOSINOPHIL % AUTO 2.9 Final    BASOPHILS % AUTO 0.9 Final    NEUTROPHIL ABS AUTO 3.70 Final    LYMPHOCYTE ABS AUTO 1.4 Final    MONOCYTES ABS AUTO 0.4 Final    EOSINOPHIL ABS AUTO 0.2 Final    BASOPHILS ABS AUTO 0 Final    ANISOCYTOSIS SL (Abnl) Final    POIKILOCYTOSIS MOD Final    MICROCYTOSIS MK Final    POLYCHROMASIA SL Final    HYPOCHROMIA SL Final    SCHISTOCYTES OCC Final    TEAR DROPS SL Final    PLATELET ESTIMATE, SMEAR Adequate Final     INFLUENZA A Negative  Negative  Final     INFLUENZA B Negative  Negative  Final    RESPIRATORY SYNCYTIAL VIRUS Negative  Negative  Final    PARAINFLUENZA Negative  Negative  Final    CORONAVIRUS Negative  Negative  Final    HUMAN METAPNEUMOVIRUS Negative  Negative  Final    RHINOVIRUS/ENTEROVIRUS POSITIVE (Abnl) Negative  Final    ADENOVIRUS Negative  Negative  Final    HUMAN BOCAVIRUS Negative  Negative  Final    CHLAMYDOPHILA PNEUMONIAE Negative  Negative  Final    MYCOPLASMA PNEUMONIAE Negative  Negative  Final    Comment:       Xray left foot:  Flattening and fragmentation of the navicular bone, compatible with  subacute fracture. Regions of sclerosis within the navicular bone likely  reflect osteonecrosis.  Old healed  fracture of the proximal fifth  metatarsal.      ASSESSMENT & PLAN:    50 year old female past medical history of DM-2, CKD-4, PE on warfarin, dCHF, HTN with CHF exacerbation and subacute vitreal hemorrhage requiring ophtho retinal surgery which occurred but delayed due to supratherapeutic INR when admitted.    Nausea, vomiting  Suspected viral gastroenteritis, improved symptomatically  Plan: supportive care, phenergan, zofran    Glaucoma  vitreal hemorrhage  diabetic retinopathy  Right eye blindness  Transferred to [place] for urgent retinal surgery for elevated IOP, [facility], due to having surgery but held off due to elevated INR.  Procedure performed by [place] optho 10/25/15.  Continue brimonidine, maxitrol, and atropine (unable to obtain 2/2 shortage)  Retina f/u postprocedurally (friday will be 1 week f/u)    AKI on CKD  Acute on chronic heart failure, diastolic  Pericardial effusion  Fluid status improving with decreasing edema in the ankles. Patient with baseline Cr 2.3.  Presented with Cr 3.5 rose to 3.7 with diureisis, improved with hydration and now stable around ~3.8 with evidence of peripheral volume overload    Plan:   renally dose medications and avoid nephrotoxins  Monitor Cr  continue lasix 80mg  po daily  Fluid restriction, daily weight    Anemia  Her blood counts are erratic but I think her baseline is around 8-9  Plan: repeat CBC with T&S    Cough  rhinorhea  Patient with rhinovirus.  Patient given vanc/cefepime in ED, but no evidence pneumonia and with viral URI.  Monitor off abx.  supportive care    HTN  Hypertensive on admission, likely related to volume overload.  Monitor with diuresis.  cont home amlodipine, hydralazine.  Have hydralazine iv PRN available.    DM2  Peripheral neuropathy  Retinopathy  A1c in march was 9.8, sugars remain consistently above 200's  Patient with BG normal with sliding scale alone despite home long acting.  Monitor and discharge on appropriate regimen.  but  will follow as renal function improves.  A1C=7.6%  Gabapentin reduced to 100mg  TID given renal function  Lantus added 6/1    Plan:   Increase lantus 10 units QHS  Start TID/AC 5 units Lispro + CDI    PE (saddle embolus) status post IVC filter  supratherapeutic INR  The PE was [facility] with IVC placed with plan for lifelong AC given unprovoked and severity.  Feb V/Q without evidence of PE. Was on coumadin 20mg  daily resulting in supratherapeutic INR on presentation here.  Now with therapeutic INR    Plan: coumadin 7.5 today    Constipation - RESOLVED  Last BM 2 days prior admission and required enema, no evidence of obstruction, no prior abdominal surgeries, benign abd exam with bowel sounds.  Resolved with aggressive bowel regimen    Iron deficiency anemia  Hemoglobin at baseline of ~8, no evidence of blood loss, receives iron infusions (last dose 5/18), last iron studies in 08/2015 with ferritin 286 and Fe % saturation 15. Iron studies unrevealing  monitor CBC    Left foot fracture  Recent Charcot's neuropathic osteoarthropathy left foot versus neuropathic fracture of the navicular bone and fifth metatarsal. Seen by ortho with worsening osteonecrosis on xray.  Patient must wear orthotics (CAM boot) boot when ambulating.   PT rec SNF  Patient will need follow up ortho outpt ~1wk following discharge.    Vulvar ulcerated lesion  Seen by gynecology on 5/16, swab positive for HSV 2, no Rx found in outside records for acyclovir or other antiviral  consider starting acyclovir or other antiviral if renal function stable  follow up with gynecology    FEN: renal/diabetic diet  DVT Prophylaxis: hep gtt until INR therapeutic 2-3 given unprovoked saddle pe  Code status: Full  Dispo: Medically stable for discharge back to SNF, [facility] without available beds      Hortensia Duffin A. Edd Fabian, MD  [phone]

## 2015-11-03 DIAGNOSIS — H4311 Vitreous hemorrhage, right eye: Principal | ICD-10-CM

## 2015-11-03 DIAGNOSIS — I130 Hypertensive heart and chronic kidney disease with heart failure and stage 1 through stage 4 chronic kidney disease, or unspecified chronic kidney disease: Secondary | ICD-10-CM

## 2015-11-03 DIAGNOSIS — N189 Chronic kidney disease, unspecified: Secondary | ICD-10-CM

## 2015-11-03 DIAGNOSIS — D649 Anemia, unspecified: Secondary | ICD-10-CM

## 2015-11-03 DIAGNOSIS — H4089 Other specified glaucoma: Secondary | ICD-10-CM

## 2015-11-03 DIAGNOSIS — D509 Iron deficiency anemia, unspecified: Secondary | ICD-10-CM

## 2015-11-03 DIAGNOSIS — R791 Abnormal coagulation profile: Secondary | ICD-10-CM

## 2015-11-03 DIAGNOSIS — E11319 Type 2 diabetes mellitus with unspecified diabetic retinopathy without macular edema: Secondary | ICD-10-CM

## 2015-11-03 DIAGNOSIS — R05 Cough: Secondary | ICD-10-CM

## 2015-11-03 DIAGNOSIS — I2692 Saddle embolus of pulmonary artery without acute cor pulmonale: Secondary | ICD-10-CM

## 2015-11-03 DIAGNOSIS — I313 Pericardial effusion (noninflammatory): Secondary | ICD-10-CM

## 2015-11-03 DIAGNOSIS — E1121 Type 2 diabetes mellitus with diabetic nephropathy: Secondary | ICD-10-CM

## 2015-11-03 DIAGNOSIS — J3489 Other specified disorders of nose and nasal sinuses: Secondary | ICD-10-CM

## 2015-11-03 DIAGNOSIS — E1140 Type 2 diabetes mellitus with diabetic neuropathy, unspecified: Secondary | ICD-10-CM

## 2015-11-03 DIAGNOSIS — R112 Nausea with vomiting, unspecified: Secondary | ICD-10-CM

## 2015-11-03 DIAGNOSIS — I5033 Acute on chronic diastolic (congestive) heart failure: Secondary | ICD-10-CM

## 2015-11-03 DIAGNOSIS — N179 Acute kidney failure, unspecified: Secondary | ICD-10-CM

## 2015-11-03 LAB — CBC WITH DIFFERENTIAL
BASOPHILS % AUTO: 0.7 %
BASOPHILS ABS AUTO: 0 10*3/uL (ref 0–0.2)
EOSINOPHIL % AUTO: 3.1 %
EOSINOPHIL ABS AUTO: 0.2 10*3/uL (ref 0–0.5)
HEMATOCRIT: 28.2 % — AB (ref 34–46)
HEMOGLOBIN: 8.6 g/dL — AB (ref 12.0–16.0)
LYMPHOCYTE ABS AUTO: 1.1 10*3/uL (ref 1.0–4.8)
LYMPHOCYTES % AUTO: 21.9 %
MCH: 21.6 pg — AB (ref 27–33)
MCHC: 30.6 % — AB (ref 32–36)
MCV: 70.5 UM3 — AB (ref 80–100)
MONOCYTES % AUTO: 6.4 %
MONOCYTES ABS AUTO: 0.3 10*3/uL (ref 0.1–0.8)
MPV: 7.4 UM3 (ref 6.8–10.0)
NEUTROPHIL ABS AUTO: 3.4 10*3/uL (ref 1.80–7.70)
NEUTROPHILS % AUTO: 67.9 %
PLATELET COUNT: 268 10*3/uL (ref 130–400)
RDW: 19.5 U — AB (ref 0–14.7)
RED CELL COUNT: 3.99 10*6/uL (ref 3.7–5.5)
WHITE BLOOD CELL COUNT: 5.1 10*3/uL (ref 4.5–11.0)

## 2015-11-03 LAB — BASIC METABOLIC PANEL
CALCIUM: 8.6 mg/dL (ref 8.6–10.5)
CARBON DIOXIDE TOTAL: 21 meq/L — AB (ref 24–32)
CHLORIDE: 106 meq/L (ref 95–110)
CREATININE BLOOD: 3.92 mg/dL — AB (ref 0.44–1.27)
E-GFR, AFRICAN AMERICAN: 15 SEE NOTE — AB (ref >60)
E-GFR, NON-AFRICAN AMERICAN: 13 — AB (ref >60)
GLUCOSE: 245 mg/dL — AB (ref 70–99)
POTASSIUM: 4.2 meq/L (ref 3.3–5.0)
SODIUM: 138 meq/L (ref 135–145)
UREA NITROGEN, BLOOD (BUN): 58 mg/dL — AB (ref 8–22)

## 2015-11-03 LAB — MAGNESIUM (MG): MAGNESIUM (MG): 1.9 mg/dL (ref 1.5–2.6)

## 2015-11-03 LAB — INR: INR: 3.36 — AB (ref 0.87–1.18)

## 2015-11-03 LAB — PHOSPHORUS (PO4): PHOSPHORUS (PO4): 5 mg/dL (ref 2.4–5.0)

## 2015-11-03 MED ORDER — WARFARIN 2 MG TABLET
6.0000 mg | ORAL_TABLET | ORAL | Status: AC
Start: 2015-11-03 — End: 2015-11-03
  Administered 2015-11-03: 6 mg via ORAL
  Filled 2015-11-03: qty 3

## 2015-11-03 MED ORDER — DEXTROMETHORPHAN-GUAIFENESIN 10 MG-100 MG/5 ML ORAL SYRUP
10.0000 mL | ORAL_SOLUTION | ORAL | Status: DC | PRN
Start: 2015-11-03 — End: 2015-11-17
  Administered 2015-11-03 – 2015-11-07 (×5): 10 mL via ORAL
  Filled 2015-11-03 (×6): qty 10

## 2015-11-03 MED ORDER — ALBUTEROL SULFATE HFA 90 MCG/ACTUATION AEROSOL INHALER
1.0000 | INHALATION_SPRAY | RESPIRATORY_TRACT | Status: DC | PRN
Start: 2015-11-03 — End: 2015-11-18
  Administered 2015-11-03 – 2015-11-07 (×4): 2 via RESPIRATORY_TRACT
  Filled 2015-11-03: qty 8

## 2015-11-03 NOTE — Allied Health Progress (Addendum)
Date of Service : 11/03/2015    Clinical Case Management Weekend/Holiday Note    Comments:  Call from Lollie MarrowBrandi, [facility] coordinator, [phone], fx [phone], [facility] orders and dc summary for SNF placement coordination, SNF orders faxed, will fax DC summary when available, Dr. Threasa BeardsYang aware.      Further follow up needed:   [facility], coordinate [facility] once [facility] notifies [facility] of accepting facility.      11/03/2015, 14:52      Electronically Signed by:    Rogue BussingGeorge Shaquavia Whisonant, Case Manager, RN  Pager: [phone]

## 2015-11-03 NOTE — Nurse Assessment (Signed)
ASSESSMENT NOTE    Note Started: 11/03/2015, 07:18     Initial assessment completed and recorded in EMR.  Report received from night shift nurse and orders reviewed. Patient reports 7/10 r-eye pain.  The plan for the day is adl's, pain management, and possible d/c to snf.   Plan of Care reviewed and appropriate, discussed with patient.   Kari BaarsJoshua Juanmiguel Defelice, RN.

## 2015-11-03 NOTE — Plan of Care (Addendum)
Problem: Patient Care Overview (Adult)  Goal: Plan of Care Review  Outcome: Ongoing (interventions implemented as appropriate)  Goal Outcome Evaluation Note     Leslie Hernandez is a [age]yr female admitted 10/20/2015      OUTCOME SUMMARY AND PLAN MOVING FORWARD:   Pt calm and cooperative during shift, AOx4, VSS, in bed resting and comfortable. Complains of itching and pain 7/10 during shift, PRN Dilaudid PO and IV given for pain and Benadryl for itching, sleeping during reassessment. [facility], slept 5-6 hours during shift.    Problem: Sleep Pattern Disturbance (Adult)  Goal: Adequate Sleep/Rest  Patient will demonstrate the desired outcomes by discharge/transition of care.   Outcome: Ongoing (interventions implemented as appropriate)    Problem: Fall Risk (Adult)  Goal: Absence of Falls  Patient will demonstrate the desired outcomes by discharge/transition of care.   Outcome: Ongoing (interventions implemented as appropriate)    Problem: Mobility, Physical Impaired (Adult)  Goal: Enhanced Mobility Skills  Patient will demonstrate the desired outcomes by discharge/transition of care.   Outcome: Ongoing (interventions implemented as appropriate)    Problem: Fluid Volume Excess (Adult,Obstetrics,Pediatric)  Goal: Stable Weight  Patient will demonstrate the desired outcomes by discharge/transition of care.   Outcome: Ongoing (interventions implemented as appropriate)    Problem: Pain, Acute (Adult)  Goal: Acceptable Pain Control/Comfort Level  Patient will demonstrate the desired outcomes by discharge/transition of care.   Outcome: Ongoing (interventions implemented as appropriate)    Problem: Pressure Ulcer Risk (Braden Scale) (Adult,Obstetrics,Pediatric)  Goal: Skin Integrity  Patient will demonstrate the desired outcomes by discharge/transition of care.   Outcome: Ongoing (interventions implemented as appropriate)

## 2015-11-03 NOTE — Progress Notes (Signed)
INTERNAL MEDICINE DAILY PROGRESS NOTE  Date: 11/03/2015  Time:  19:22      Leslie Hernandez is a 50yo F with DM2, CKD4, PE on coumadin, dCHF, and HTN who presented 5/20 for progressive right eye vision loss 2/2 vitreal hemorrhage about to undergo vitrectomy but found to have supratherapeutic INR in PACU so sent to ED where she was found to have dyspnea, cough, rhinorrhea, nausea, and vomiting with an INR 4.87. She was found to have a Rhinovirus URI as well as decompensated CHF and subsequently admitted for further medical management.     INTERVAL HISTORY:    - INR 3.36 today, [facility] pharmacist recommended Coumadin 6mg  x 1  - [facility] SNF paperwork filled out by previous hospitalist and given to DCP to fax to [facility]    SUBJECTIVE:    Reports unable to sleep last night due to eye pain, improved to 7/10 with IV dilaudid.     MEDICATIONS:  Scheduled Medications  Amlodipine (NORVASC) Tablet 10 mg, ORAL, QAM  Atorvastatin (LIPITOR) Tablet 80 mg, ORAL, Daily Bedtime  Brimonidine (ALPHAGAN) 0.2 % Ophthalmic Solution 1 drop, BOTH Eyes, TID  Docusate (COLACE) Capsule 100 mg, ORAL, BID  FUROsemide (LASIX) Tablet 80 mg, ORAL, QAM  Gabapentin (NEURONTIN) Capsule 100 mg, ORAL, TID  HydrALAZINE (APRESOLINE) Tablet 100 mg, ORAL, TID  Insulin Aspart (NOVOLOG) Injection Pen 1-4 Units, SUBCUTANEOUS, Daily Bedtime  Insulin Aspart (NOVOLOG) Injection Pen 1-6 Units, SUBCUTANEOUS, TID w/ meals - correction dose  Insulin Glargine (LANTUS) Injection 8 Units, SUBCUTANEOUS, Daily Bedtime  Lidocaine (LIDODERM) 5 %(700 mg/patch) Patch 2 patch, Transdermal, Q24H Now  Lidocaine patch REMOVAL 1 patch, Transdermal, Q24H Now  Neomycin/Polymyxin/Dexamethasone (MAXITROL) Ophthalmic Ointment 0.5 inch, RIGHT Eye, QID  Polyethylene Glycol 3350 (MIRALAX) Oral Powder Packet 17 g, ORAL, QAM  Sennosides (SENOKOT) Tablet 17.2 mg, ORAL, Daily Bedtime  Warfarin Patient Flag, NOT APPLICABLE, PATIENT FLAG ONLY    IV Medications   PRN Medications  Acetaminophen (TYLENOL) Tablet  650 mg, ORAL, Q4H PRN  Albuterol (PROAIR HFA, PROVENTIL HFA, VENTOLIN HFA) Inhaler 1-2 puff, INHALATION, Q4H PRN  Bisacodyl (DULCOLAX) Suppository 10 mg, RECTALLY, Q24H PRN  Dextrose 50% Injection 12.5 g, IV, PRN  Dextrose 50% Injection 25 g, IV, PRN  DiphenhydrAMINE (BENADRYL) Capsule 25 mg, ORAL, Q6H PRN  Glucagon Injection 1 mg, IM, PRN  Guaifenesin 100mg -Dextromethorphan 10mg /42ml (ROBITUSSIN DM) Syrup 10 mL, ORAL, Q4H PRN  HydrALAZINE (APRESOLINE) Injection 10 mg, IV, Q4H PRN  Hydromorphone (DILAUDID) Injection 1-1.5 mg, IV, Q3H PRN  Hydromorphone (DILAUDID) Tablet 2 mg, ORAL, Q4H PRN  Lactulose (CHRONULAC) 10 gram/15 mL Solution 30 mL, ORAL, Q6H PRN  Mineral Oil (FLEET) Enema 1 enema, RECTALLY, Bedtime PRN  Ondansetron (ZOFRAN) Injection 4 mg, IV, Q8H PRN  Promethazine (PHENERGAN) Injection 12.5 mg, IV, Q6H PRN        OBJECTIVE:   Vital Signs  Summary  Temp Min: 36.3 C (97.4 F) Max: 37.2 C (98.9 F)  BP: (141-164)/(81-94)   Pulse Min: 75 Max: 89  Resp Min: 18 Max: 18  SpO2 Min: 98 % Max: 100 %      Current Vitals  Temp: 36.3 C (97.4 F)  BP: (!) 164/94  Pulse: 87  Resp: 18  SpO2: 98 %      Weight: 111.8 kg (246 lb 8 oz)     Intake and Output  Last Two Completed Shifts  In: 1260 [Oral:1260]  Out: 850 [Urine:850]    Current Shift       Physical Exam  General:  obese female, No acute distress, conversational. A+Ox3  HEENT:  Moist oral mucosa.  Right occluded with bandages c/d/i/ Left pupil reactive to light.   CV: Regular rate and rhythm, nl s1s2, no murmurs, rubs or gallops. No JVD.  Pulm: CTAB  good symmetric air movement  Abd: Soft NT ND, +BS. No rebound or guarding  Skin: No rashes. No jaundice.  Neuro: face symmetric, moving all 4 extremities spontaneously.  Left foot with TTP along lateral aspect.  Extrem:  1+ LE ankle edema b/l.    LINES AND DRAINS:   piv    LAB TESTS/STUDIES:     I personally reviewed the following report(s)  .    Lab Results - 24 hours (excluding micro and POC)   BASIC  METABOLIC PANEL     Status: Abnormal   Result Value Status    SODIUM 138 Final    POTASSIUM 4.2 Final    CHLORIDE 106 Final    CARBON DIOXIDE TOTAL 21 (L) Final    UREA NITROGEN, BLOOD (BUN) 58 (H) Final    CREATININE BLOOD 3.92 (H) Final    E-GFR, AFRICAN AMERICAN 15 (L) Final    E-GFR, NON-AFRICAN AMERICAN 13 (L) Final    GLUCOSE 245 (H) Final    CALCIUM 8.6 Final   MAGNESIUM (MG)     Status: None   Result Value Status    MAGNESIUM (MG) 1.9 Final   PHOSPHORUS (PO4)     Status: None   Result Value Status    PHOSPHORUS (PO4) 5.0 Final   INR     Status: Abnormal   Result Value Status    INR 3.36 (H) Final   CBC WITH DIFFERENTIAL     Status: Abnormal   Result Value Status    WHITE BLOOD CELL COUNT 5.1 Final    RED CELL COUNT 3.99 Final    HEMOGLOBIN 8.6 (L) Final    HEMATOCRIT 28.2 (L) Final    MCV 70.5 (L) Final    MCH 21.6 (L) Final    MCHC 30.6 (L) Final    RDW 19.5 (H) Final    MPV 7.4 Final    PLATELET COUNT 268 Final    NEUTROPHILS % AUTO 67.9 Final    LYMPHOCYTES % AUTO 21.9 Final    MONOCYTES % AUTO 6.4 Final    EOSINOPHIL % AUTO 3.1 Final    BASOPHILS % AUTO 0.7 Final    NEUTROPHIL ABS AUTO 3.40 Final    LYMPHOCYTE ABS AUTO 1.1 Final    MONOCYTES ABS AUTO 0.3 Final    EOSINOPHIL ABS AUTO 0.2 Final    BASOPHILS ABS AUTO 0 Final     INFLUENZA A Negative  Negative  Final     INFLUENZA B Negative  Negative  Final    RESPIRATORY SYNCYTIAL VIRUS Negative  Negative  Final    PARAINFLUENZA Negative  Negative  Final    CORONAVIRUS Negative  Negative  Final    HUMAN METAPNEUMOVIRUS Negative  Negative  Final    RHINOVIRUS/ENTEROVIRUS POSITIVE (Abnl) Negative  Final    ADENOVIRUS Negative  Negative  Final    HUMAN BOCAVIRUS Negative  Negative  Final    CHLAMYDOPHILA PNEUMONIAE Negative  Negative  Final    MYCOPLASMA PNEUMONIAE Negative  Negative  Final    Comment:       Xray left foot:  Flattening and fragmentation of the navicular bone, compatible with  subacute fracture. Regions of sclerosis within the  navicular bone likely  reflect osteonecrosis.  Old healed fracture of the proximal fifth metatarsal.      ASSESSMENT & PLAN:    50 year old female past medical history of DM-2, CKD-4, PE on warfarin, dCHF, HTN with CHF exacerbation and subacute vitreal hemorrhage requiring ophtho retinal surgery which occurred but delayed due to supratherapeutic INR when admitted.    Glaucoma  Vitreal hemorrhage 2/2 supratherapeutic INR  Diabetic retinopathy  Right eye blindness  Transferred to [place] for urgent retinal surgery for elevated IOP but procedure delayed due to supratherapeutic INR and vitrious hemorrage. S/p procedure by [place] [facility] on 5/25.   - Continue brimonidine, maxitrol, and atropine (unable to obtain 2/2 shortage)  - Retina f/u postprocedurally (friday will be 1 week f/u)    AKI on CKD  Acute on chronic heart failure, diastolic  Pericardial effusion  Baseline Cre 2.3, presented with Cr 3.5 which rose with diuresis. Was given IVF with minimal improvement. Has symmetric 1-2+ pitting edema of lower extremities. Will check BNP to assess for change from 5/20 (145)  - renally dose medications and avoid nephrotoxins  - Monitor Cr  - continue lasix  po daily  - Fluid restriction, daily standing weight  - F/u repeat BNP tomorrow AM    Anemia  Her blood counts are erratic but I think her baseline is around 8-9  Plan: repeat CBC with T&S    Cough  rhinorhea  Patient with rhinovirus.  Patient given vanc/cefepime in ED, but no evidence pneumonia and with viral URI.  Monitor off abx.  supportive care     HTN  Hypertensive on admission, likely related to volume overload.  Monitor with diuresis.  cont home amlodipine, hydralazine.  Have hydralazine iv PRN available.    DM2  Peripheral neuropathy  Retinopathy  A1c in march was 9.8, sugars remain consistently above 200's  Patient with BG normal with sliding scale alone despite home long acting.  Monitor and discharge on appropriate regimen.  but will follow as renal function  improves.  A1C=7.6%  Gabapentin reduced to  TID given renal function  Lantus added 6/1  - continue lantus 8u QHS + SSI with meals    PE (saddle embolus) status post IVC filter  supratherapeutic INR  The PE was diagnosed 3 years ago at [facility] with IVC placed with plan for lifelong AC given unprovoked and severity.  Feb V/Q without evidence of PE. Was on coumadin  daily resulting in supratherapeutic INR on presentation here.  Now with therapeutic INR  - [facility] pharmacy to follow  - warfarin  x 1 today    Constipation - RESOLVED  Last BM 2 days prior admission and required enema, no evidence of obstruction, no prior abdominal surgeries, benign abd exam with bowel sounds.  Resolved with aggressive bowel regimen    Iron deficiency anemia  Hemoglobin at baseline of ~8, no evidence of blood loss, receives iron infusions (last dose 5/18), last iron studies in 08/2015 with ferritin 286 and Fe % saturation 15. Iron studies unrevealing  monitor CBC    Left foot fracture  Recent Charcot's neuropathic osteoarthropathy left foot versus neuropathic fracture of the navicular bone and fifth metatarsal. Seen by ortho with worsening osteonecrosis on xray.  Patient must wear orthotics (CAM boot) boot when ambulating.   PT rec SNF  Patient will need follow up ortho outpt ~1wk following discharge.    Vulvar ulcerated lesion  Seen by gynecology on 5/16, swab positive for HSV 2, no Rx found in outside records for  acyclovir or other antiviral   consider starting acyclovir or other antiviral if renal function stable  follow up with gynecology    FEN: renal/diabetic diet  DVT Prophylaxis: Coumadin  Code status: Full  Dispo: Medically stable for [facility]    Erin Hearinglara Inola Lisle, MD   Internal Medicine Attending  Hospitalist Division  Pager: [phone]  PI: [id number]

## 2015-11-03 NOTE — Plan of Care (Addendum)
Problem: Patient Care Overview (Adult)  Goal: Plan of Care Review  Outcome: Ongoing (interventions implemented as appropriate)  Goal Outcome Evaluation Note     Leslie Hernandez is a [age]yr female admitted 10/20/2015      OUTCOME SUMMARY AND PLAN MOVING FORWARD:   Pt calm and cooperative, AOx4, VSS. Administered PO dilaudid for pain 7/10, upon reassessment patient was sleeping. Up to bedside commode to urinate x1 during shift. Slept 5-6 hours during shift. Will continue to monitor.     Problem: Fall Risk (Adult)  Goal: Absence of Falls  Patient will demonstrate the desired outcomes by discharge/transition of care.   Outcome: Ongoing (interventions implemented as appropriate)    Problem: Mobility, Physical Impaired (Adult)  Goal: Enhanced Mobility Skills  Patient will demonstrate the desired outcomes by discharge/transition of care.   Outcome: Ongoing (interventions implemented as appropriate)    Problem: Fluid Volume Excess (Adult,Obstetrics,Pediatric)  Goal: Stable Weight  Patient will demonstrate the desired outcomes by discharge/transition of care.   Outcome: Ongoing (interventions implemented as appropriate)    Problem: Pressure Ulcer Risk (Braden Scale) (Adult,Obstetrics,Pediatric)  Goal: Skin Integrity  Patient will demonstrate the desired outcomes by discharge/transition of care.   Outcome: Ongoing (interventions implemented as appropriate)

## 2015-11-03 NOTE — Nurse Assessment (Signed)
ASSESSMENT NOTE    Note Started: 11/03/2015, 19:20     Initial assessment completed and recorded in EMR.  Report received from day shift nurse and orders reviewed. Plan of Care reviewed and appropriate, discussed with patient. Pt AOx4, resting in bed, VSS, comfortable and safe. Plan for today is safety and rest. Salena Sanereok Tavoris Brisk, RN RN

## 2015-11-03 NOTE — Plan of Care (Signed)
Problem: Patient Care Overview (Adult)  Goal: Plan of Care Review  Outcome: Ongoing (interventions implemented as appropriate)  Goal Outcome Evaluation Note     Leslie Hernandez is a [age]yr female admitted 10/20/2015      OUTCOME SUMMARY AND PLAN MOVING FORWARD:      VSS. NAC. Pt pleasant and cooperative. CHG shower done, linen changed. Mepilex on LLE changed. Sleeping between care much of day. Pt awaiting placement to sn[facility] via [facility]. Will continue to monitor.         Goal: Individualization and Mutuality  Outcome: Ongoing (interventions implemented as appropriate)  Goal: Discharge Needs Assessment  Outcome: Ongoing (interventions implemented as appropriate)    Problem: Sleep Pattern Disturbance (Adult)  Goal: Adequate Sleep/Rest  Patient will demonstrate the desired outcomes by discharge/transition of care.   Outcome: Ongoing (interventions implemented as appropriate)    Problem: Fall Risk (Adult)  Goal: Absence of Falls  Patient will demonstrate the desired outcomes by discharge/transition of care.   Outcome: Ongoing (interventions implemented as appropriate)    Problem: Mobility, Physical Impaired (Adult)  Goal: Enhanced Mobility Skills  Patient will demonstrate the desired outcomes by discharge/transition of care.   Outcome: Ongoing (interventions implemented as appropriate)  Goal: Enhanced Functionality Ability  Patient will demonstrate the desired outcomes by discharge/transition of care.   Outcome: Ongoing (interventions implemented as appropriate)    Problem: Fluid Volume Excess (Adult,Obstetrics,Pediatric)  Goal: Stable Weight  Patient will demonstrate the desired outcomes by discharge/transition of care.   Outcome: Ongoing (interventions implemented as appropriate)  Goal: Balanced Intake/Output  Patient will demonstrate the desired outcomes by discharge/transition of care.   Outcome: Ongoing (interventions implemented as appropriate)    Problem: Pain, Acute (Adult)  Goal: Acceptable Pain Control/Comfort  Level  Patient will demonstrate the desired outcomes by discharge/transition of care.   Outcome: Ongoing (interventions implemented as appropriate)    Problem: Pressure Ulcer Risk (Braden Scale) (Adult,Obstetrics,Pediatric)  Goal: Skin Integrity  Patient will demonstrate the desired outcomes by discharge/transition of care.   Outcome: Ongoing (interventions implemented as appropriate)

## 2015-11-04 LAB — CBC WITH DIFFERENTIAL
BASOPHILS % AUTO: 0.7 %
BASOPHILS ABS AUTO: 0 10*3/uL (ref 0–0.2)
EOSINOPHIL % AUTO: 3 %
EOSINOPHIL ABS AUTO: 0.2 10*3/uL (ref 0–0.5)
HEMATOCRIT: 28.1 % — AB (ref 34–46)
HEMOGLOBIN: 8.8 g/dL — AB (ref 12.0–16.0)
LYMPHOCYTE ABS AUTO: 1.3 10*3/uL (ref 1.0–4.8)
LYMPHOCYTES % AUTO: 25.4 %
MCH: 21.7 pg — AB (ref 27–33)
MCHC: 31.2 % — AB (ref 32–36)
MCV: 69.7 UM3 — AB (ref 80–100)
MONOCYTES % AUTO: 6 %
MONOCYTES ABS AUTO: 0.3 10*3/uL (ref 0.1–0.8)
MPV: 7.3 UM3 (ref 6.8–10.0)
NEUTROPHIL ABS AUTO: 3.3 10*3/uL (ref 1.80–7.70)
NEUTROPHILS % AUTO: 64.9 %
PLATELET COUNT: 276 10*3/uL (ref 130–400)
RDW: 19.3 U — AB (ref 0–14.7)
RED CELL COUNT: 4.04 10*6/uL (ref 3.7–5.5)
WHITE BLOOD CELL COUNT: 5.2 10*3/uL (ref 4.5–11.0)

## 2015-11-04 LAB — PHOSPHORUS (PO4): PHOSPHORUS (PO4): 5.4 mg/dL — AB (ref 2.4–5.0)

## 2015-11-04 LAB — BASIC METABOLIC PANEL
CALCIUM: 8.4 mg/dL — AB (ref 8.6–10.5)
CARBON DIOXIDE TOTAL: 21 meq/L — AB (ref 24–32)
CHLORIDE: 106 meq/L (ref 95–110)
CREATININE BLOOD: 3.86 mg/dL — AB (ref 0.44–1.27)
E-GFR, AFRICAN AMERICAN: 15 SEE NOTE — AB (ref >60)
E-GFR, NON-AFRICAN AMERICAN: 13 — AB (ref >60)
GLUCOSE: 182 mg/dL — AB (ref 70–99)
POTASSIUM: 4.3 meq/L (ref 3.3–5.0)
SODIUM: 137 meq/L (ref 135–145)
UREA NITROGEN, BLOOD (BUN): 62 mg/dL — AB (ref 8–22)

## 2015-11-04 LAB — MAGNESIUM (MG): MAGNESIUM (MG): 2 mg/dL (ref 1.5–2.6)

## 2015-11-04 LAB — B-TYPE NATRIURETIC PEPTIDE: B-TYPE NATRIURETIC PEPTIDE: 49 pg/mL (ref 0–100)

## 2015-11-04 LAB — INR: INR: 3.39 — AB (ref 0.87–1.18)

## 2015-11-04 MED ORDER — CARVEDILOL 6.25 MG TABLET
6.2500 mg | ORAL_TABLET | Freq: Two times a day (BID) | ORAL | Status: DC
Start: 2015-11-04 — End: 2015-11-05
  Administered 2015-11-04 – 2015-11-05 (×2): 6.25 mg via ORAL
  Filled 2015-11-04 (×2): qty 1

## 2015-11-04 MED ORDER — WARFARIN 10 MG TABLET
ORAL_TABLET | ORAL | 0 refills | Status: DC
Start: 2015-11-04 — End: 2015-11-10

## 2015-11-04 MED ORDER — ALBUTEROL SULFATE HFA 90 MCG/ACTUATION AEROSOL INHALER
1.0000 | INHALATION_SPRAY | RESPIRATORY_TRACT | 3 refills | Status: DC | PRN
Start: 2015-11-04 — End: 2015-11-18

## 2015-11-04 MED ORDER — INSULIN GLARGINE 100 UNIT/ML SUB-Q VIAL
8.0000 [IU] | Freq: Every day | SUBCUTANEOUS | 11 refills | Status: DC
Start: 2015-11-04 — End: 2015-11-18

## 2015-11-04 MED ORDER — NEOMYCIN 3.5 MG/G-POLYMYXIN B 10,000 UNIT/G-DEXAMETH 0.1 % EYE OINT
0.5000 [in_us] | TOPICAL_OINTMENT | Freq: Two times a day (BID) | OPHTHALMIC | 1 refills | Status: DC
Start: 2015-11-04 — End: 2015-11-10

## 2015-11-04 MED ORDER — WARFARIN 2 MG TABLET
5.0000 mg | ORAL_TABLET | ORAL | Status: AC
Start: 2015-11-04 — End: 2015-11-04
  Administered 2015-11-04: 13:00:00 5 mg via ORAL
  Filled 2015-11-04: qty 1

## 2015-11-04 MED ORDER — FUROSEMIDE 40 MG TABLET
80.0000 mg | ORAL_TABLET | Freq: Every day | ORAL | 0 refills | Status: DC
Start: 2015-11-04 — End: 2015-11-18

## 2015-11-04 MED ORDER — NEOMYCIN 3.5 MG/G-POLYMYXIN B 10,000 UNIT/G-DEXAMETH 0.1 % EYE OINT
0.5000 [in_us] | TOPICAL_OINTMENT | Freq: Four times a day (QID) | OPHTHALMIC | 1 refills | Status: DC
Start: 2015-11-04 — End: 2015-11-04

## 2015-11-04 MED ORDER — BRIMONIDINE 0.2 % EYE DROPS
1.0000 [drp] | Freq: Three times a day (TID) | OPHTHALMIC | 3 refills | Status: DC
Start: 2015-11-04 — End: 2015-11-18

## 2015-11-04 MED ORDER — CARVEDILOL 6.25 MG TABLET
6.2500 mg | ORAL_TABLET | Freq: Two times a day (BID) | ORAL | 0 refills | Status: DC
Start: 2015-11-04 — End: 2015-11-05

## 2015-11-04 MED ORDER — CALCIUM ACETATE(PHOSPHATE BINDERS) 667 MG CAPSULE
667.0000 mg | ORAL_CAPSULE | Freq: Three times a day (TID) | ORAL | 0 refills | Status: DC
Start: 2015-11-04 — End: 2015-11-18

## 2015-11-04 MED ORDER — WARFARIN 10 MG TABLET
ORAL_TABLET | ORAL | 0 refills | Status: DC
Start: 2015-11-04 — End: 2015-11-04

## 2015-11-04 MED ORDER — NEOMYCIN 3.5 MG/G-POLYMYXIN B 10,000 UNIT/G-DEXAMETH 0.1 % EYE OINT
0.5000 [in_us] | TOPICAL_OINTMENT | Freq: Two times a day (BID) | OPHTHALMIC | Status: DC
Start: 2015-11-04 — End: 2015-11-18
  Administered 2015-11-04 – 2015-11-18 (×28): 0.5 [in_us] via OPHTHALMIC
  Filled 2015-11-04 (×3): qty 3.5

## 2015-11-04 MED ORDER — CALCIUM ACETATE(PHOSPHATE BINDERS) 667 MG CAPSULE
667.0000 mg | ORAL_CAPSULE | Freq: Three times a day (TID) | ORAL | Status: DC
Start: 2015-11-04 — End: 2015-11-05
  Administered 2015-11-04 – 2015-11-05 (×3): 667 mg via ORAL
  Filled 2015-11-04 (×3): qty 1

## 2015-11-04 NOTE — Plan of Care (Signed)
Problem: Patient Care Overview (Adult)  Goal: Plan of Care Review  Outcome: Ongoing (interventions implemented as appropriate)  Goal Outcome Evaluation Note     Leslie Hernandez is a [age]yr female admitted 10/20/2015      OUTCOME SUMMARY AND PLAN MOVING FORWARD:      VSS. NAC. PO and IV dilaudid prn for pain. Good appetite. Pt using bsc with standby assist. Pt awaiting snf placement via [facility]. Will continue to monitor.        Goal: Individualization and Mutuality  Outcome: Ongoing (interventions implemented as appropriate)  Goal: Discharge Needs Assessment  Outcome: Ongoing (interventions implemented as appropriate)    Problem: Sleep Pattern Disturbance (Adult)  Goal: Adequate Sleep/Rest  Patient will demonstrate the desired outcomes by discharge/transition of care.   Outcome: Ongoing (interventions implemented as appropriate)    Problem: Fall Risk (Adult)  Goal: Absence of Falls  Patient will demonstrate the desired outcomes by discharge/transition of care.   Outcome: Ongoing (interventions implemented as appropriate)    Problem: Mobility, Physical Impaired (Adult)  Goal: Enhanced Mobility Skills  Patient will demonstrate the desired outcomes by discharge/transition of care.   Outcome: Ongoing (interventions implemented as appropriate)  Goal: Enhanced Functionality Ability  Patient will demonstrate the desired outcomes by discharge/transition of care.   Outcome: Ongoing (interventions implemented as appropriate)    Problem: Fluid Volume Excess (Adult,Obstetrics,Pediatric)  Goal: Stable Weight  Patient will demonstrate the desired outcomes by discharge/transition of care.   Outcome: Ongoing (interventions implemented as appropriate)  Goal: Balanced Intake/Output  Patient will demonstrate the desired outcomes by discharge/transition of care.   Outcome: Ongoing (interventions implemented as appropriate)    Problem: Pain, Acute (Adult)  Goal: Acceptable Pain Control/Comfort Level  Patient will demonstrate the desired  outcomes by discharge/transition of care.   Outcome: Ongoing (interventions implemented as appropriate)    Problem: Pressure Ulcer Risk (Braden Scale) (Adult,Obstetrics,Pediatric)  Goal: Skin Integrity  Patient will demonstrate the desired outcomes by discharge/transition of care.   Outcome: Ongoing (interventions implemented as appropriate)

## 2015-11-04 NOTE — Nurse Focus (Signed)
Spoke to Dr. Edd Fabiankamoto on the phone regarding crackles in her lungs, and her wet cough. Asked if patient would need IV lasix, per pt request and clinica presentation. Due to no change in her condition, lasix not needed. Also, patient on lasix qdaily 80mg  PO.

## 2015-11-04 NOTE — Discharge Summary (Addendum)
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  Qty: 90 capsule, Refills: 0      Carvedilol (COREG) 6.25 mg Tablet Take 4 tablets by mouth 2 times daily with meals.  Qty: 60 tablet, Refills: 0      Docusate (COLACE) 100 mg Capsule Take 2 capsules by mouth 2 times daily.  Qty: 60 capsule, Refills: 11      Insulin Glargine (LANTUS) 100 unit/mL Vial Inject 8 Units subcutaneously every day at bedtime.  Qty: 10 mL, Refills: 11      Lactulose (CHRONULAC) 10 gram/15 mL Solution Take 30 mL by mouth every 6 hours if needed (constipation).  Qty: 200 mL, Refills: 0      Neomycin/Polymyxin/Dexamethasone (MAXITROL) 3.5 mg/g-10,000 unit/g-0.1 % Ophthalmic Ointment Instill 0.5 inches into the RIGHT eye 2 times daily.  Qty: 3.5 g, Refills: 1         CONTINUE these medications which have CHANGED    Details   Acetaminophen (TYLENOL) 500 mg Tablet Take 2 tablets by mouth every 8 hours.  Qty: 100 tablet, Refills: 0      Albuterol (PROAIR HFA, PROVENTIL HFA, VENTOLIN HFA) 90 mcg/actuation inhaler Take 1-2 puffs by inhalation every 4 hours if needed.  Qty: 8.5 g, Refills: 3      Brimonidine (ALPHAGAN) 0.2 % Ophthalmic Solution Instill 1 drop into EACH eye 3 times  daily.  Qty: 10 mL, Refills: 3      FUROsemide (LASIX) 40 mg Tablet Take 2 tablets by mouth every day.  Qty: 60 tablet, Refills: 0      Gabapentin (NEURONTIN) 300 mg Capsule Take 1 capsule by mouth 3 times daily.  Qty: 90 capsule, Refills: 11      Warfarin (COUMADIN) 10 mg Tablet Indications: pulmonary thromboembolism As directed by SNF MD based on INR levels  Qty: 60 tablet, Refills: 0    Comments: 6 mg daily or as directed by coumadin clinic         CONTINUE these medications which have NOT CHANGED    Details   Amlodipine (NORVASC) 10 mg Tablet Take 1 tablet by mouth every day. Indications: hypertension                  Atorvastatin (LIPITOR) 80 mg tablet Take 1 tablet by mouth every day.                  Calcium-Cholecalciferol, D3, (CALCIUM 600 + D) 600 mg(1,500mg ) -400 unit Tablet Take 1 tablet by mouth 2 times daily.      HydrALAZINE (APRESOLINE) 100 mg tablet Take 1 tablet by mouth 3  times daily.      Insulin Lispro, Human, (HUMALOG KWIKPEN) 100 unit/mL Pen Inject subcutaneously before meals and at bedtime as directed per sliding scale;if blood glucose is below 60 and resident is alert, give snack, if not alert start hypoglycemia protocol;  if 150-200= 1 unit; 201-250= 2 units; 251-300= 3 units; 301-350= 4 units; 351-400= 5 units; 401 and above= 6 units. Call MD for blood glucose above 400      Melatonin 3 mg Tablet Take 1 tablet by mouth every day at bedtime if needed for insomnia.      Multivitamins with Minerals 1 tab tablet Take 1 tablet by mouth every day.         STOP taking these medications       Bisacodyl (DULCOLAX) 10 mg Suppository Comments:   Reason for Stopping:         Cholecalciferol, Vitamin D3, (VITAMIN D-3) 400 unit Tablet Comments:   Reason for Stopping:         Dorzolamide 2%/Timolol 0.5% (COSOPT) Ophthalmic Solution Comments:   Reason for Stopping:         ferrous sulfate (FERRO-TIME) 325 mg (65 mg iron) Tablet Comments:   Reason for Stopping:         INSULIN NPH HUMAN ISOPHANE (NPH  INSULIN HUMAN RECOMB SC) Comments:   Reason for Stopping:         Insulin NPH Human Recomb (HUMULIN N KWIKPEN) 100 unit/mL (3 mL) Pen Comments:   Reason for Stopping:         Latanoprost (XALATAN) 0.005 % Ophthalmic Solution Comments:   Reason for Stopping:         Magnesium Hydroxide (MILK OF MAGNESIA CONCENTRATED) 2,400 mg/10 mL Suspension Comments:   Reason for Stopping:         Meclizine (ANTIVERT) 12.5 mg Tablet Comments:   Reason for Stopping:         NUT.TX.GLUCOSE INTOLERANCE,SOY (GLUCERNA PO) Comments:   Reason for Stopping:         Ondansetron 4 mg Film Comments:   Reason for Stopping:         Prochlorperazine (COMPAZINE) 10 mg Tablet Comments:   Reason for Stopping:         Saccharomyces boulardii (FLORASTOR) 250 mg Capsule Comments:   Reason for Stopping:         Sennosides (SENNA) 8.6 mg Tablet Comments:   Reason for Stopping:         Sodium Phosphates (FLEET ENEMA) 19-7 gram/118 mL Enema Comments:   Reason for Stopping:         Tramadol (ULTRAM) 50 mg Tablet Comments:   Reason for Stopping:                   Procedure(s) Performed:   10/25/15 - vitrectomy, endolaser panretinal laser photocoagulation R eye.     Consultation(s):   OPHTHALMOLOGY CONSULT     Reason for Admission and Brief HPI:   Please see original H&P for more details. In brief, Leslie Hernandez is a 50 yo AA woman with PMH of DM2, CKD4, saddle PE s/p IVC filter on Coumadin, dCHF, HTN who presented for urgent retinal surgery for elevated IOP after anterior chamber tap at [facility], initial surgery was delayed 2/2 supratherapeutic INR.     Hospital Course:   # Glaucoma  # Vitreal hemorrhage with increased IOP s/p urgent retinal surgery  # Diabetic retinopathy  # Right eye blindness  Transferred to [place] for urgent retinal surgery for elevated  IOP but procedure delayed due to supratherapeutic INR and vitrious hemorrage. S/p vitrectomy and laser photocoagulatuion by [facility] on 5/25.   - continue bromonidine gtt both eyes TID  - maxitrol R eye  bid x 2 weeks (6/4-6/24  - tapering maxitrol to R eye only twice daily for 2 more weeks (6/4-6/18)  - unable to do atropine gtt due to national shortage  - F/u 3 weeks as outpatient at [facility] or [place] per patient preference    (schedule with Dr. Willaim Bane at [facility] or [facility] Ole[facility])    # HTN  Persistently hypertensive. Already on hydralazine 100mg  TID, amlodipine 10mg  daily, and lasix 80mg  daily. Started on coreg, uptitrating  - Coreg 25 mg bid in addition to above    #AKI on CKD  # Acute on chronic heart failure, diastolic  Baseline Cre 2.3, presented with Cr 3.5 which rose with diuresis.  Was given IVF with minimal improvement. Cr stable 4.09 (peak 4.18) on discharge. Has symmetric 1-2+ pitting edema of lower extremities. BNP improved from 5/20, thus unlikely 2/2 cardiorenal.  - Will attempt for better BP control, uptitrating coreg.   - renally dose medications and avoid nephrotoxins  - Monitor Cr  - continue lasix 80mg  po daily  - Fluid restriction, daily standing weight  - outpatient referral nephrology for management    # Anemia  Iron studies suggestive of anemia of chronic disease.  Hgb stable 8-9 while in house.    # Cough  # rhinorhea  Patient with rhinovirus but has been in-house for 16 days, no longer having significant secretions.   - continue guaifenisin cough syrup PRN  - d/c isolation precautions    # DM2  # Peripheral neuropathy  # Retinopathy  A1c 7.6% in May 2017. Gabapentin reduced to 100mg  TID given renal function.  Lantus added 6/1  - continue lantus 8u QHS + SSI with meals    # PE (saddle embolus) status post IVC filter  # supratherapeutic INR  The PE was diagnosed 3 years ago at [facility] with IVC placed with plan for lifelong AC given unprovoked and severity.  Feb V/Q without evidence of PE. Was on coumadin 20mg  daily resulting in supratherapeutic INR on presentation here.  Now with therapeutic INR.  DOAC n/a given worsening CKD.  - [facility] pharmacy to follow  - warfarin 6mg  x 1 today and  on discharge, f/u coumadin clinic      # Left foot fracture  Recent Charcot's neuropathic osteoarthropathy left foot versus neuropathic fracture of the navicular bone and fifth metatarsal. Seen by ortho with worsening osteonecrosis on xray.  - Patient must wear orthotics (CAM boot) boot when ambulating.   - Patient will need follow up ortho outpt ~1wk following discharge.    COMPLICATIONS DURING ADMISSION   Complication(s) During Admission: None         Vital Signs:  Current Vitals  Temp: 36.7 C (98 F)  BP: (!) 165/91  Pulse: 86  Resp: 16  SpO2: 98 %  Flow (L/min): 10   Weight: 113.8 kg (250 lb 14.4 oz)     Physical Exam:  General: obese female, No acute distress, conversational. A+Ox3  HEENT:  Moist oral mucosa.  R eye chronic blindness. Left pupil reactive to light.   CV: Regular rate and rhythm, nl s1s2, no murmurs, rubs or gallops. No JVD.  Pulm: Coarse BS 2/2 transmitted upper airway noises.  No appreciable rales or wheeze.  Abd: Soft NT ND, +BS.  No rebound or guarding  Skin: No rashes. No jaundice.  Neuro: face symmetric, moving all 4 extremities spontaneously.    Extrem: trace edema bilaterally      CONDITION AT DISCHARGE   Condition at Discharge: Stable             DISCHARGE DISPOSITION   Discharge To: SNF           Discharge Instructions:    DISCHARGE DIET   Discharge Diet: Cardiac (3 gms Sodium)    Discharge Diet: Renal    Discharge Diet: Carbohydrate Controlled (Diabetic Diet)      DISCHARGE FLUID INSTRUCTIONS   2L/day   Discharge Fluid Instructions: Restrict, Specify mls / 24 hours in Comments      DISCHARGE ACTIVITY RESTRICTIONS   Patient needs to wear CAM boot on L foot while ambulating. Patient should wear eye shiled over R eye while sleeping for 1 more week   Discharge Activity Restrictions: No restrictions    Discharge Activity Restrictions: Other, Specify in Comments          Recommended Follow Up Appointments:  No follow-up provider specified.     Scheduled Appointments:  No future  appointments.     Pertinent Lab, Study, and Image Findings:    I personally reviewed the following  BASIC METABOLIC PANEL   Recent labs for the past 72 hours     11/10/15 0843 11/09/15 0707 11/08/15 0737    GLUCOSE 135* 162* 140*    UREA NITROGEN, BLOOD (BUN) 70* 65* 67*    CREATININE BLOOD 4.09* 4.18* 4.18*    SODIUM 140 137 139    POTASSIUM 4.5 4.8 4.3    CHLORIDE 110 105 108    CARBON DIOXIDE TOTAL 23* 23* 22*    CALCIUM 8.4* 8.9 8.6        CBC   Recent labs for the past 72 hours     11/10/15 0843 11/09/15 0707 11/08/15 0737    WHITE BLOOD CELL COUNT 3.6* 3.6* 3.7*    HEMOGLOBIN 8.1* 9.4* 8.6*    HEMATOCRIT 26.0* 30.6* 27.8*    PLATELET COUNT 241 298 235     INR Recent labs for the past 72 hours     11/05/15 0747 11/04/15 0944 11/03/15 0900    INR 2.89* 3.39* 3.36*           Results for Leslie Hernandez, Leslie Hernandez (MRN [id number]) as of 11/05/2015 15:57   Ref. Range 10/21/2015 08:06   IRON TOTAL Latest Ref Range: 42 - 135 ug/dL 26 (L)   TRANSFERRIN Latest Ref Range: 192 - 382 mg/dL 454 (L)   TOTAL IRON BINDING CAPACITY Latest Ref Range: 280 - 400 ug/dL 098 (L)   IRON PERCENT SATURATION Latest Ref Range: 15 - 50 % 16.0   FERRITIN Latest Ref Range: 10 - 291 ng/mL 844 (H)     Results for Leslie Hernandez, Leslie Hernandez (MRN [id number]) as of 11/05/2015 15:57   Ref. Range 10/21/2015 08:06   HGB A1C Latest Ref Range: 3.9 - 5.6 % 7.6 (H)     Results for Leslie Hernandez, Leslie Hernandez (MRN [id number]) as of 11/05/2015 15:57   Ref. Range 10/20/2015 16:50 11/04/2015 09:44   B-TYPE NATRIURETIC PEPTIDE Latest Ref Range: 0 - 100 pg/mL 145 (H) 49       Studies Pending at Time of Discharge: None    Comments to Outpatient Physician:  Please follow up on   - F/u with ortho in 1 week  - F/u with Ophthalmology in 3 weeks  -  F/u nephrology for CKD4    Total time spent on discharge 120 minutes.     Kennith MaesGerald Tayvon Culley, MD  Associate Physician - Internal Medicine  [facility]  [facility]   Pager # [phone]  PI # [phone]      Default CC to:    Myles Gipoy A Berry III, MD     Additional  CC to:  --   --

## 2015-11-04 NOTE — Nurse Assessment (Signed)
ASSESSMENT NOTE    Note Started: 11/04/2015, 07:04     Initial assessment completed and recorded in EMR.  Report received from night shift nurse and orders reviewed. Patient resting comfortably, nad.  The plan for the day is adl's and possible [facility].   Plan of Care reviewed and appropriate, discussed with patient.   Kari BaarsJoshua Jenavieve Freda, RN.

## 2015-11-04 NOTE — Progress Notes (Signed)
INTERNAL MEDICINE DAILY PROGRESS NOTE  Date: 11/04/2015  Time:  13:00      Leslie Hernandez is a 50yo F with DM2, CKD4, PE on coumadin, dCHF, and HTN who presented 5/20 for progressive right eye vision loss 2/2 vitreal hemorrhage about to undergo vitrectomy but found to have supratherapeutic INR in PACU so sent to ED where she was found to have dyspnea, cough, rhinorrhea, nausea, and vomiting with an INR 4.87. She was found to have a Rhinovirus URI as well as decompensated CHF and subsequently admitted for further medical management.     INTERVAL HISTORY:    - BNP 49 (previous 149)  - INR 3.39 today. Warfarin 5mg  x 1 ordered  - Phos high, started on phoslo TIDQAC  - Hypertensive to SBP 180s today. Coreg started for improved BP control.     SUBJECTIVE:    Continues to report R eye pain, 8/10 in severity, improved with IV pain medicaiton     MEDICATIONS:  Scheduled Medications  Amlodipine (NORVASC) Tablet 10 mg, ORAL, QAM  Atorvastatin (LIPITOR) Tablet 80 mg, ORAL, Daily Bedtime  Brimonidine (ALPHAGAN) 0.2 % Ophthalmic Solution 1 drop, BOTH Eyes, TID  Docusate (COLACE) Capsule 100 mg, ORAL, BID  FUROsemide (LASIX) Tablet 80 mg, ORAL, QAM  Gabapentin (NEURONTIN) Capsule 100 mg, ORAL, TID  HydrALAZINE (APRESOLINE) Tablet 100 mg, ORAL, TID  Insulin Aspart (NOVOLOG) Injection Pen 1-4 Units, SUBCUTANEOUS, Daily Bedtime  Insulin Aspart (NOVOLOG) Injection Pen 1-6 Units, SUBCUTANEOUS, TID w/ meals - correction dose  Insulin Glargine (LANTUS) Injection 8 Units, SUBCUTANEOUS, Daily Bedtime  Lidocaine (LIDODERM) 5 %(700 mg/patch) Patch 2 patch, Transdermal, Q24H Now  Lidocaine patch REMOVAL 1 patch, Transdermal, Q24H Now  Neomycin/Polymyxin/Dexamethasone (MAXITROL) Ophthalmic Ointment 0.5 inch, RIGHT Eye, QID  Polyethylene Glycol 3350 (MIRALAX) Oral Powder Packet 17 g, ORAL, QAM  Sennosides (SENOKOT) Tablet 17.2 mg, ORAL, Daily Bedtime  Warfarin Patient Flag, NOT APPLICABLE, PATIENT FLAG ONLY    IV Medications   PRN  Medications  Acetaminophen (TYLENOL) Tablet 650 mg, ORAL, Q4H PRN  Albuterol (PROAIR HFA, PROVENTIL HFA, VENTOLIN HFA) Inhaler 1-2 puff, INHALATION, Q4H PRN  Bisacodyl (DULCOLAX) Suppository 10 mg, RECTALLY, Q24H PRN  Dextrose 50% Injection 12.5 g, IV, PRN  Dextrose 50% Injection 25 g, IV, PRN  DiphenhydrAMINE (BENADRYL) Capsule 25 mg, ORAL, Q6H PRN  Glucagon Injection 1 mg, IM, PRN  Guaifenesin 100mg -Dextromethorphan 10mg /655ml (ROBITUSSIN DM) Syrup 10 mL, ORAL, Q4H PRN  HydrALAZINE (APRESOLINE) Injection 10 mg, IV, Q4H PRN  Hydromorphone (DILAUDID) Injection 1-1.5 mg, IV, Q3H PRN  Hydromorphone (DILAUDID) Tablet 2 mg, ORAL, Q4H PRN  Lactulose (CHRONULAC) 10 gram/15 mL Solution 30 mL, ORAL, Q6H PRN  Mineral Oil (FLEET) Enema 1 enema, RECTALLY, Bedtime PRN  Ondansetron (ZOFRAN) Injection 4 mg, IV, Q8H PRN  Promethazine (PHENERGAN) Injection 12.5 mg, IV, Q6H PRN      OBJECTIVE:   Vital Signs  Summary  Temp Min: 36.3 C (97.4 F) Max: 37 C (98.6 F)  BP: (141-182)/(81-98)   Pulse Min: 86 Max: 94  Resp Min: 16 Max: 18  SpO2 Min: 98 % Max: 100 %      Current Vitals  Temp: 37 C (98.6 F)  BP: (!) 182/98  Pulse: 94  Resp: 18  SpO2: 100 %      Weight: 111.8 kg (246 lb 8 oz)     Intake and Output  Last Two Completed Shifts  In: 1190 [Oral:1180; Crystalloid:10]  Out: 1850 [Urine:1850]    Current Shift  In: 260 [Oral:260]  Out: 0     Physical Exam  General: obese female, No acute distress, conversational. A+Ox3  HEENT:  Moist oral mucosa.  R eye chronic blindness. Left pupil reactive to light.   CV: Regular rate and rhythm, nl s1s2, no murmurs, rubs or gallops. No JVD.  Pulm: Coarse BS 2/2 transmitted upper airway noises.  No appreciable rales or wheeze.  Abd: Soft NT ND, +BS. No rebound or guarding  Skin: No rashes. No jaundice.  Neuro: face symmetric, moving all 4 extremities spontaneously.    Extrem:  1+ LE ankle edema b/l.    LINES AND DRAINS:   piv    LAB TESTS/STUDIES:   I personally reviewed the following  report(s)  .  Lab Results - 24 hours (excluding micro and POC)   BASIC METABOLIC PANEL     Status: Abnormal   Result Value Status    SODIUM 137 Final    POTASSIUM 4.3 Final    CHLORIDE 106 Final    CARBON DIOXIDE TOTAL 21 (L) Final    UREA NITROGEN, BLOOD (BUN) 62 (H) Final    CREATININE BLOOD 3.86 (H) Final    E-GFR, AFRICAN AMERICAN 15 (L) Final    E-GFR, NON-AFRICAN AMERICAN 13 (L) Final    GLUCOSE 182 (H) Final    CALCIUM 8.4 (L) Final   MAGNESIUM (MG)     Status: None   Result Value Status    MAGNESIUM (MG) 2.0 Final   PHOSPHORUS (PO4)     Status: Abnormal   Result Value Status    PHOSPHORUS (PO4) 5.4 (H) Final   INR     Status: Abnormal   Result Value Status    INR 3.39 (H) Final   CBC WITH DIFFERENTIAL     Status: Abnormal   Result Value Status    WHITE BLOOD CELL COUNT 5.2 Final    RED CELL COUNT 4.04 Final    HEMOGLOBIN 8.8 (L) Final    HEMATOCRIT 28.1 (L) Final    MCV 69.7 (L) Final    MCH 21.7 (L) Final    MCHC 31.2 (L) Final    RDW 19.3 (H) Final    MPV 7.3 Final    PLATELET COUNT 276 Final    NEUTROPHILS % AUTO 64.9 Final    LYMPHOCYTES % AUTO 25.4 Final    MONOCYTES % AUTO 6.0 Final    EOSINOPHIL % AUTO 3.0 Final    BASOPHILS % AUTO 0.7 Final    NEUTROPHIL ABS AUTO 3.30 Final    LYMPHOCYTE ABS AUTO 1.3 Final    MONOCYTES ABS AUTO 0.3 Final    EOSINOPHIL ABS AUTO 0.2 Final    BASOPHILS ABS AUTO 0 Final   B-TYPE NATRIURETIC PEPTIDE     Status: None   Result Value Status    B-TYPE NATRIURETIC PEPTIDE 49 Final     INFLUENZA A Negative  Negative  Final     INFLUENZA B Negative  Negative  Final    RESPIRATORY SYNCYTIAL VIRUS Negative  Negative  Final    PARAINFLUENZA Negative  Negative  Final    CORONAVIRUS Negative  Negative  Final    HUMAN METAPNEUMOVIRUS Negative  Negative  Final    RHINOVIRUS/ENTEROVIRUS POSITIVE (Abnl) Negative  Final    ADENOVIRUS Negative  Negative  Final    HUMAN BOCAVIRUS Negative  Negative  Final    CHLAMYDOPHILA PNEUMONIAE Negative  Negative  Final    MYCOPLASMA  PNEUMONIAE Negative  Negative  Final    Comment:  Xray left foot:  Flattening and fragmentation of the navicular bone, compatible with  subacute fracture. Regions of sclerosis within the navicular bone likely  reflect osteonecrosis.  Old healed fracture of the proximal fifth metatarsal.    ASSESSMENT & PLAN:    50 year old female past medical history of DM-2, CKD-4, PE on warfarin, dCHF, HTN with CHF exacerbation and subacute vitreal hemorrhage requiring ophtho retinal surgery which occurred but delayed due to supratherapeutic INR when admitted.    Glaucoma  Vitreal hemorrhage 2/2 supratherapeutic INR  Diabetic retinopathy  Right eye blindness  Transferred to [place] for urgent retinal surgery for elevated IOP but procedure delayed due to supratherapeutic INR and vitrious hemorrage. S/p procedure by [facility] on 5/25.   - Continue brimonidine, maxitrol, and atropine (unable to obtain 2/2 shortage)  - Retina f/u postprocedurally (friday will be 1 week f/u)    HTN  Persistently hypertensive. Already on hydralazine  TID, amlodipine  daily, and lasix  daily.   - Added coreg 6.25mg  BID, uptitrate as needed    AKI on CKD  Acute on chronic heart failure, diastolic  Pericardial effusion  Baseline Cre 2.3, presented with Cr 3.5 which rose with diuresis. Was given IVF with minimal improvement. Has symmetric 1-2+ pitting edema of lower extremities. BNP improved from 5/20, thus unlikely 2/2 cardiorenal. BP not controlled.   - Will attempt for better BP control, added coreg.   - renally dose medications and avoid nephrotoxins  - Monitor Cr  - continue lasix  po daily  - Fluid restriction, daily standing weight    Anemia  Her blood counts are erratic but I think her baseline is around 8-9. Has remained stable while in-house.     Cough  rhinorhea  Patient with rhinovirus.  Patient given vanc/cefepime in ED, but no evidence pneumonia and with viral URI.  Monitor off abx.  supportive care      DM2  Peripheral neuropathy  Retinopathy  A1c in march was 9.8, sugars remain consistently above 200's  Patient with BG normal with sliding scale alone despite home long acting.  Monitor and discharge on appropriate regimen.  but will follow as renal function improves.  A1C=7.6%  Gabapentin reduced to  TID given renal function  Lantus added 6/1  - continue lantus 8u QHS + SSI with meals    PE (saddle embolus) status post IVC filter  supratherapeutic INR  The PE was diagnosed 3 years ago at [facility] with IVC placed with plan for lifelong AC given unprovoked and severity.  Feb V/Q without evidence of PE. Was on coumadin  daily resulting in supratherapeutic INR on presentation here.  Now with therapeutic INR  - [facility] pharmacy to follow  - warfarin  x 1 today    Constipation - RESOLVED  Last BM 2 days prior admission and required enema, no evidence of obstruction, no prior abdominal surgeries, benign abd exam with bowel sounds.  Resolved with aggressive bowel regimen    Iron deficiency anemia  Hemoglobin at baseline of ~8, no evidence of blood loss, receives iron infusions (last dose 5/18), last iron studies in 08/2015 with ferritin 286 and Fe % saturation 15. Iron studies unrevealing  monitor CBC    Left foot fracture  Recent Charcot's neuropathic osteoarthropathy left foot versus neuropathic fracture of the navicular bone and fifth metatarsal. Seen by ortho with worsening osteonecrosis on xray.  Patient must wear orthotics (CAM boot) boot when ambulating.   PT rec SNF  Patient  will need follow up ortho outpt ~1wk following discharge.    Vulvar ulcerated lesion  Seen by gynecology on 5/16, swab positive for HSV 2, no Rx found in outside records for acyclovir or other antiviral   consider starting acyclovir or other antiviral if renal function stable  follow up with gynecology    FEN: renal/diabetic diet  DVT Prophylaxis: Coumadin  Code status: Full  Dispo: Medically stable for discharge back to  SNF, [facility] without available beds    Erin Hearing, MD   Internal Medicine Attending  Hospitalist Division  Pager: [phone]  PI: [phone]

## 2015-11-04 NOTE — Allied Health Progress (Signed)
Date of Service : 11/04/2015     Clinical Case Management Weekend/Holiday Note    Comments:  Weekend CM reviewed weekend list.  Pt on list as possible d/c.  Per review of chart, pt resided at [facility], but pt requested different SNF upon d/c.  Attempts made to search for alternative SNF, but to no avail, no accepting.  Attempts made to [facility]; however, per Johnson Controlseorge White's note on 5/30, Jacki ConesLaurie at facility refused to take pt back citing no bed hold, pt's behaviors and non-compliance.  Please note: pt with straight Medi-Cal at the time and thus bed hold should have occurred.  [facility] effective 11/01/15 and thus efforts to locate SNF via [facility].      Per review of updated SNF referral, [facility] agreed to accept pt, only if pt is short term and pt can return to room she rents (please note: pt was renting a room prior to this admission and it is clearly in the clinical information sent to the SNF).  CM responded to [facility] asking if they are contracted with [facility].    CM called Brandi at [facility], [phone], fx [phone], who confirmed she never received any orders yesterday from CM.  She stated she will work on securing a SNF, once she has the SNF and d/c summary orders faxed to her.  She confirmed fax number as above.      CM paged MD, Dr. Threasa BeardsYang, [phone], to request she complete SNF and d/c summary orders, so they can be faxed to [facility].  Per MD, she completed the [facility] paperwork that CM Greggory StallionGeorge provided to her yesterday.  CM confirmed with [facility] they want actual SNF and d/c summary orders completed by MD and then cm to fax to them, so they can search for SNF.  MD stated she will complete the orders by tonight.        Further follow up needed:   Once SNF and d/c summary orders completed, assigned CM to fax to [facility], [place], p.[phone], fx [phone], so [facility] can begin  SNF search.        11/04/2015, 15:41      Electronically Signed by:    Theotis BurrowKim Anaiza Behrens, Case Manager, Discharge Planner  Pager: [phone]

## 2015-11-04 NOTE — Discharge Summary (Signed)
Skilled Nursing Home Physician's Orders    [facility]      PHYSICIAN ORDERS      PATIENT:        Leslie Hernandez   MRN:                [id number]    DOB:               22-Jun-1965     Date, time: 11/10/2015, TBD      1. Discharged to SNF on (Date 11/10/2015,.Time TBD):      2. Primary Diagnosis:     # Glaucoma  # Vitreal hemorrhage 2/2 supratherapeutic INR s/p urgent retinal surgery  # R eye blindness    3. Rehabilitation Potential: (Fair)     4. CPR status: Full           Supportive documentation provided in (H&P)     5. Decisional capacity: Yes has capacity             6. Patient has been informed of medical diagnosis: yes           If no, who was notified?      7. Responsible Party / Conservator: Tajanae Guilbault          Phone number: [phone]    8. Allergies:     Metformin    Diarrhea  Vicodin [Hydrocodone-Acetaminophen]    Nausea/Vomiting     9. Isolation type / location: None      10. Diet:             DISCHARGE DIET   Discharge Diet: Cardiac (3 gms Sodium)    Discharge Diet: Renal    Discharge Diet: Carbohydrate Controlled (Diabetic Diet)      DISCHARGE FLUID INSTRUCTIONS   2L/day   Discharge Fluid Instructions: Restrict, Specify mls / 24 hours in Comments      DISCHARGE ACTIVITY RESTRICTIONS   Patient needs to wear CAM boot on L foot while ambulating. Patient should wear eye shiled over R eye while sleeping for 1 more week   Discharge Activity Restrictions: No restrictions    Discharge Activity Restrictions: Other, Specify in Comments        11. Activity restriction:   Patient needs to wear CAM boot on L foot while ambulating. OK to bear weight.      12. Therapies: (evaluate and treat   Physical therapy -   yes  Occupational therapy-   yes  Speech therapy-   no    Respiratory therapy - No  Continuous pulse ox - No  Incentive spirometry- yes     13. Laboratory tests: INR check every Mon, Wed, Fri until stable Coumadin dosing.           Follow up results to:  Skilled Nursing Facility MD    14. IV therapy type (iv, PICC, central, other): None    15. Vital signs: Routine (q shift x 3 days, then weekly)     16. Weight: routine on admission, then monthly     17.TB Screen: PPD done - no        Date:                     CXR done - yes        Date: 10/20/15    18. Type of restraint used in acute care: ( none )     19. Wound care: (  Use eye shield over R eye for bedtime use daily for 1 more week )     20. Bowel care: ( routine)     21. Scheduled follow up appointments:   Primary care: Myles Gip, MD  [address]  [place] [address]  [phone]    In 1 week       - Patient needs follow-up in 3 weeks as an outpatient with [facility] or [facility] (Dr. Willaim Bane if [place], Dr. Olevia Bowens if [facility])  - Patient needs ortho follow-up in 1 week for L foot fracture   - coumadin clinic for warfarin dosing      22. Medications and treatments:   Current Discharge Medication List      START taking these medications    Details   Calcium Acetate (PHOSLO) 667 mg Capsule Take 1 capsule by mouth 3 times daily with meals.  Qty: 90 capsule, Refills: 0      Carvedilol (COREG) 6.25 mg Tablet Take 4 tablets by mouth 2 times daily with meals.  Qty: 60 tablet, Refills: 0      Docusate (COLACE) 100 mg Capsule Take 2 capsules by mouth 2 times daily.  Qty: 60 capsule, Refills: 11      Insulin Glargine (LANTUS) 100 unit/mL Vial Inject 8 Units subcutaneously every day at bedtime.  Qty: 10 mL, Refills: 11      Lactulose (CHRONULAC) 10 gram/15 mL Solution Take 30 mL by mouth every 6 hours if needed (constipation).  Qty: 200 mL, Refills: 0      Neomycin/Polymyxin/Dexamethasone (MAXITROL) 3.5 mg/g-10,000 unit/g-0.1 % Ophthalmic Ointment Instill 0.5 inches into the RIGHT eye 2 times daily.  Qty: 3.5 g, Refills: 1         CONTINUE these medications which have CHANGED    Details   Acetaminophen (TYLENOL) 500 mg Tablet Take 2 tablets by mouth every 8 hours.  Qty: 100 tablet, Refills: 0      Albuterol (PROAIR HFA,  PROVENTIL HFA, VENTOLIN HFA) 90 mcg/actuation inhaler Take 1-2 puffs by inhalation every 4 hours if needed.  Qty: 8.5 g, Refills: 3      Brimonidine (ALPHAGAN) 0.2 % Ophthalmic Solution Instill 1 drop into EACH eye 3 times daily.  Qty: 10 mL, Refills: 3      FUROsemide (LASIX) 40 mg Tablet Take 2 tablets by mouth every day.  Qty: 60 tablet, Refills: 0      Gabapentin (NEURONTIN) 300 mg Capsule Take 1 capsule by mouth 3 times daily.  Qty: 90 capsule, Refills: 11      Warfarin (COUMADIN) 10 mg Tablet Indications: pulmonary thromboembolism As directed by SNF MD based on INR levels  Qty: 60 tablet, Refills: 0    Comments: 6 mg daily or as directed by coumadin clinic         CONTINUE these medications which have NOT CHANGED    Details   Amlodipine (NORVASC) 10 mg Tablet Take 1 tablet by mouth every day. Indications: hypertension                  Atorvastatin (LIPITOR) 80 mg tablet Take 1 tablet by mouth every day.                  Calcium-Cholecalciferol, D3, (CALCIUM 600 + D) 600 mg(1,500mg ) -400 unit Tablet Take 1 tablet by mouth 2 times daily.      HydrALAZINE (APRESOLINE) 100 mg tablet Take 1 tablet by mouth 3 times daily.  Insulin Lispro, Human, (HUMALOG KWIKPEN) 100 unit/mL Pen Inject subcutaneously before meals and at bedtime as directed per sliding scale;if blood glucose is below 60 and resident is alert, give snack, if not alert start hypoglycemia protocol;  if 150-200= 1 unit; 201-250= 2 units; 251-300= 3 units; 301-350= 4 units; 351-400= 5 units; 401 and above= 6 units. Call MD for blood glucose above 400      Melatonin 3 mg Tablet Take 1 tablet by mouth every day at bedtime if needed for insomnia.      Multivitamins with Minerals 1 tab tablet Take 1 tablet by mouth every day.         STOP taking these medications       Bisacodyl (DULCOLAX) 10 mg Suppository Comments:   Reason for Stopping:         Cholecalciferol, Vitamin D3, (VITAMIN D-3) 400 unit Tablet Comments:   Reason for Stopping:          Dorzolamide 2%/Timolol 0.5% (COSOPT) Ophthalmic Solution Comments:   Reason for Stopping:         ferrous sulfate (FERRO-TIME) 325 mg (65 mg iron) Tablet Comments:   Reason for Stopping:         INSULIN NPH HUMAN ISOPHANE (NPH INSULIN HUMAN RECOMB SC) Comments:   Reason for Stopping:         Insulin NPH Human Recomb (HUMULIN N KWIKPEN) 100 unit/mL (3 mL) Pen Comments:   Reason for Stopping:         Latanoprost (XALATAN) 0.005 % Ophthalmic Solution Comments:   Reason for Stopping:         Magnesium Hydroxide (MILK OF MAGNESIA CONCENTRATED) 2,400 mg/10 mL Suspension Comments:   Reason for Stopping:         Meclizine (ANTIVERT) 12.5 mg Tablet Comments:   Reason for Stopping:         NUT.TX.GLUCOSE INTOLERANCE,SOY (GLUCERNA PO) Comments:   Reason for Stopping:         Ondansetron 4 mg Film Comments:   Reason for Stopping:         Prochlorperazine (COMPAZINE) 10 mg Tablet Comments:   Reason for Stopping:         Saccharomyces boulardii (FLORASTOR) 250 mg Capsule Comments:   Reason for Stopping:         Sennosides (SENNA) 8.6 mg Tablet Comments:   Reason for Stopping:         Sodium Phosphates (FLEET ENEMA) 19-7 gram/118 mL Enema Comments:   Reason for Stopping:         Tramadol (ULTRAM) 50 mg Tablet Comments:   Reason for Stopping:                23. If you smoke, STOP.     24. Foley in place?:no           If present, routine foley care PRN.     Electronically signed by:     Kennith MaesGerald Coley Kulikowski, MD  Associate Physician - Internal Medicine  Section of [facility]  [facility]   Pager # [phone]  PI # [phone]

## 2015-11-04 NOTE — Nurse Assessment (Signed)
ASSESSMENT NOTE    Note Started: 11/04/2015, 20:21     Initial assessment completed and recorded in EMR.  Report received from day shift nurse and orders reviewed. Plan of Care reviewed and appropriate, discussed with patient.  A+Ox4. VSS. Pleasant, calm and cooperative. Will continue to monitor. Thelma Companeva Eve Matheau [place]ron[place], RN

## 2015-11-05 LAB — CBC WITH DIFFERENTIAL
BASOPHILS % AUTO: 1 %
BASOPHILS ABS AUTO: 0 10*3/uL (ref 0–0.2)
EOSINOPHIL % AUTO: 3.7 %
EOSINOPHIL ABS AUTO: 0.2 10*3/uL (ref 0–0.5)
HEMATOCRIT: 29.4 % — AB (ref 34–46)
HEMOGLOBIN: 9.2 g/dL — AB (ref 12.0–16.0)
LYMPHOCYTE ABS AUTO: 1.2 10*3/uL (ref 1.0–4.8)
LYMPHOCYTES % AUTO: 26.1 %
MCH: 21.9 pg — AB (ref 27–33)
MCHC: 31.1 % — AB (ref 32–36)
MCV: 70.2 UM3 — AB (ref 80–100)
MONOCYTES % AUTO: 6.3 %
MONOCYTES ABS AUTO: 0.3 10*3/uL (ref 0.1–0.8)
MPV: 7.6 UM3 (ref 6.8–10.0)
NEUTROPHIL ABS AUTO: 2.8 10*3/uL (ref 1.80–7.70)
NEUTROPHILS % AUTO: 62.9 %
PLATELET COUNT: 282 10*3/uL (ref 130–400)
RDW: 18.8 U — AB (ref 0–14.7)
RED CELL COUNT: 4.19 10*6/uL (ref 3.7–5.5)
WHITE BLOOD CELL COUNT: 4.5 10*3/uL (ref 4.5–11.0)

## 2015-11-05 LAB — INR: INR: 2.89 — AB (ref 0.87–1.18)

## 2015-11-05 LAB — BASIC METABOLIC PANEL
CALCIUM: 8.6 mg/dL (ref 8.6–10.5)
CARBON DIOXIDE TOTAL: 21 meq/L — AB (ref 24–32)
CHLORIDE: 107 meq/L (ref 95–110)
CREATININE BLOOD: 4.14 mg/dL — AB (ref 0.44–1.27)
E-GFR, AFRICAN AMERICAN: 14 SEE NOTE — AB (ref >60)
E-GFR, NON-AFRICAN AMERICAN: 12 — AB (ref >60)
GLUCOSE: 200 mg/dL — AB (ref 70–99)
POTASSIUM: 4.5 meq/L (ref 3.3–5.0)
SODIUM: 138 meq/L (ref 135–145)
UREA NITROGEN, BLOOD (BUN): 69 mg/dL — AB (ref 8–22)

## 2015-11-05 LAB — MAGNESIUM (MG): MAGNESIUM (MG): 2.1 mg/dL (ref 1.5–2.6)

## 2015-11-05 LAB — PHOSPHORUS (PO4): PHOSPHORUS (PO4): 5.6 mg/dL — AB (ref 2.4–5.0)

## 2015-11-05 MED ORDER — DOCUSATE SODIUM 100 MG CAPSULE
200.0000 mg | ORAL_CAPSULE | Freq: Two times a day (BID) | ORAL | 11 refills | Status: DC
Start: 2015-11-05 — End: 2015-11-18

## 2015-11-05 MED ORDER — CARVEDILOL 6.25 MG TABLET
12.5000 mg | ORAL_TABLET | Freq: Two times a day (BID) | ORAL | 0 refills | Status: DC
Start: 2015-11-05 — End: 2015-11-10

## 2015-11-05 MED ORDER — ACETAMINOPHEN 500 MG TABLET
1000.0000 mg | ORAL_TABLET | Freq: Three times a day (TID) | ORAL | 0 refills | Status: DC
Start: 2015-11-06 — End: 2015-11-18

## 2015-11-05 MED ORDER — WARFARIN 2 MG TABLET
6.0000 mg | ORAL_TABLET | ORAL | Status: AC
Start: 2015-11-05 — End: 2015-11-05
  Administered 2015-11-05: 6 mg via ORAL
  Filled 2015-11-05: qty 3

## 2015-11-05 MED ORDER — DOCUSATE SODIUM 100 MG CAPSULE
200.0000 mg | ORAL_CAPSULE | Freq: Two times a day (BID) | ORAL | Status: DC
Start: 2015-11-05 — End: 2015-11-18
  Administered 2015-11-08 – 2015-11-17 (×9): 200 mg via ORAL
  Filled 2015-11-05 (×15): qty 2

## 2015-11-05 MED ORDER — CARVEDILOL 6.25 MG TABLET
12.5000 mg | ORAL_TABLET | Freq: Two times a day (BID) | ORAL | Status: DC
Start: 2015-11-05 — End: 2015-11-09
  Administered 2015-11-05 – 2015-11-09 (×8): 12.5 mg via ORAL
  Filled 2015-11-05 (×8): qty 2

## 2015-11-05 MED ORDER — ACETAMINOPHEN 500 MG TABLET
1000.0000 mg | ORAL_TABLET | Freq: Three times a day (TID) | ORAL | Status: DC
Start: 2015-11-06 — End: 2015-11-18
  Administered 2015-11-06 – 2015-11-18 (×37): 1000 mg via ORAL
  Filled 2015-11-05 (×37): qty 2

## 2015-11-05 MED ORDER — LACTULOSE 10 GRAM/15 ML (15 ML) ORAL SOLUTION
30.0000 mL | Freq: Four times a day (QID) | ORAL | 0 refills | Status: DC | PRN
Start: 2015-11-05 — End: 2015-11-18

## 2015-11-05 MED ORDER — LACTULOSE 10 GRAM/15 ML (15 ML) ORAL SOLUTION
30.0000 mL | Freq: Four times a day (QID) | ORAL | Status: DC
Start: 2015-11-05 — End: 2015-11-10
  Administered 2015-11-05: 30 mL via ORAL
  Filled 2015-11-05 (×3): qty 30

## 2015-11-05 MED ORDER — CALCIUM ACETATE(PHOSPHATE BINDERS) 667 MG CAPSULE
1334.0000 mg | ORAL_CAPSULE | Freq: Three times a day (TID) | ORAL | Status: DC
Start: 2015-11-05 — End: 2015-11-18
  Administered 2015-11-05 – 2015-11-18 (×39): 1334 mg via ORAL
  Filled 2015-11-05 (×39): qty 2

## 2015-11-05 NOTE — Plan of Care (Signed)
Problem: Patient Care Overview (Adult)  Goal: Plan of Care Review  Outcome: Ongoing (interventions implemented as appropriate)  Goal Outcome Evaluation Note     Leslie Hernandez is a [age]yr female admitted 10/20/2015      OUTCOME SUMMARY AND PLAN MOVING FORWARD:   A+Ox4. Follows commands. VSS. Lungs have crackles B/L. Fluid restriction maintained at 1500mL a day. HOb elevated and propped up on pillows to assist with breathing. Sp02 WDL. Blood sugar checks with meals and at bedtime. Independent in bed for positioning, 1 person assist with walker for OOb activities. Has BSC to void/stool. No Bowel movement during the night, patient concerned about being constipated, continued stool softener use. Mepilex in place to left heel, pressure wound appears to be granulating. Pain to right eye and left leg, tolerating well with IV dilaudid. Encouraged patient use of PO dilaudid before use of IV dilaudid. Slept well throughout the night. Will continue to monitor.   Goal: Individualization and Mutuality  Outcome: Ongoing (interventions implemented as appropriate)  Goal: Discharge Needs Assessment  Outcome: Ongoing (interventions implemented as appropriate)    Problem: Sleep Pattern Disturbance (Adult)  Goal: Adequate Sleep/Rest  Patient will demonstrate the desired outcomes by discharge/transition of care.   Outcome: Ongoing (interventions implemented as appropriate)    Problem: Fall Risk (Adult)  Goal: Absence of Falls  Patient will demonstrate the desired outcomes by discharge/transition of care.   Outcome: Ongoing (interventions implemented as appropriate)    Problem: Mobility, Physical Impaired (Adult)  Goal: Enhanced Mobility Skills  Patient will demonstrate the desired outcomes by discharge/transition of care.   Outcome: Ongoing (interventions implemented as appropriate)  Goal: Enhanced Functionality Ability  Patient will demonstrate the desired outcomes by discharge/transition of care.   Outcome: Ongoing (interventions  implemented as appropriate)    Problem: Fluid Volume Excess (Adult,Obstetrics,Pediatric)  Goal: Stable Weight  Patient will demonstrate the desired outcomes by discharge/transition of care.   Outcome: Ongoing (interventions implemented as appropriate)  Goal: Balanced Intake/Output  Patient will demonstrate the desired outcomes by discharge/transition of care.   Outcome: Ongoing (interventions implemented as appropriate)    Problem: Pain, Acute (Adult)  Goal: Acceptable Pain Control/Comfort Level  Patient will demonstrate the desired outcomes by discharge/transition of care.   Outcome: Ongoing (interventions implemented as appropriate)    Problem: Pressure Ulcer Risk (Braden Scale) (Adult,Obstetrics,Pediatric)  Goal: Skin Integrity  Patient will demonstrate the desired outcomes by discharge/transition of care.   Outcome: Ongoing (interventions implemented as appropriate)

## 2015-11-05 NOTE — Allied Health Progress (Signed)
Intensive Case Management Follow Up Note    Comments: CM spoke with CM Leslie Hernandez (covering pt on Saturday, 6/3) re: SNF order [facility], as well as d/c summary.  Per Leslie Hernandez, the SNF orders ([facility] is up on the floor in pt's file & [facility].  Re: the d/c summary, the d/c summary was not completed as of Saturday, so he never faxed it.  In review of chart, d/c summary still not completed.      CM thus paged MD, Dr. Threasa BeardsYang, to clarify if pt is stable to transfer to SNF, and to clarify:   -- if no changes to existing SNF orders (that are on the floor in pt's folder) then CM can change date and [facility]  -- However, if any changes, then MD will need to make changes to orders and sign and date.    -- Also, d/c summary must be completed, so [facility] can [facility].    Clarified with Dr. Threasa BeardsYang that pt remains stable to d/c with same SNF orders as of Saturday & she will work on d/c summary today.      CM faxed Leslie [facility], [place], Leslie Hernandez, fx [phone], the SNF orders (completed on 11/03/15 by Dr. Edd Fabiankamoto and haven't changed per Dr. Threasa BeardsYang).         Plan:  [facility], once MD completes.  [facility] for [facility] once they get their documents.      Date: 11/05/2015 Time: 08:14  Leslie Hernandez, MSW Intensive Clinical Case Manager/ Discharge Planner  [phone], [phone]

## 2015-11-05 NOTE — Plan of Care (Signed)
Problem: Patient Care Overview (Adult)  Goal: Plan of Care Review  Outcome: Ongoing (interventions implemented as appropriate)  Goal Outcome Evaluation Note     Leslie Hernandez is a [age]yr female admitted 10/20/2015      OUTCOME SUMMARY AND PLAN MOVING FORWARD:   Patient with elevated BP, other VSS, with BMx2, ambulated to hall with cam boot on and FWW, sat up in chair for dinner, standing weight obtained. Patient in better spirits today after accomplishing all set goals of having a BM and getting OOB. Plan is for SNF placement.       Goal: Individualization and Mutuality  Outcome: Ongoing (interventions implemented as appropriate)  Goal: Discharge Needs Assessment  Outcome: Ongoing (interventions implemented as appropriate)    Problem: Sleep Pattern Disturbance (Adult)  Goal: Adequate Sleep/Rest  Patient will demonstrate the desired outcomes by discharge/transition of care.   Outcome: Ongoing (interventions implemented as appropriate)    Problem: Fall Risk (Adult)  Goal: Absence of Falls  Patient will demonstrate the desired outcomes by discharge/transition of care.   Outcome: Ongoing (interventions implemented as appropriate)    Problem: Mobility, Physical Impaired (Adult)  Goal: Enhanced Mobility Skills  Patient will demonstrate the desired outcomes by discharge/transition of care.   Outcome: Ongoing (interventions implemented as appropriate)  Goal: Enhanced Functionality Ability  Patient will demonstrate the desired outcomes by discharge/transition of care.   Outcome: Ongoing (interventions implemented as appropriate)    Problem: Fluid Volume Excess (Adult,Obstetrics,Pediatric)  Goal: Stable Weight  Patient will demonstrate the desired outcomes by discharge/transition of care.   Outcome: Ongoing (interventions implemented as appropriate)  Goal: Balanced Intake/Output  Patient will demonstrate the desired outcomes by discharge/transition of care.   Outcome: Ongoing (interventions implemented as  appropriate)    Problem: Pain, Acute (Adult)  Goal: Acceptable Pain Control/Comfort Level  Patient will demonstrate the desired outcomes by discharge/transition of care.   Outcome: Ongoing (interventions implemented as appropriate)    Problem: Pressure Ulcer Risk (Braden Scale) (Adult,Obstetrics,Pediatric)  Goal: Skin Integrity  Patient will demonstrate the desired outcomes by discharge/transition of care.   Outcome: Ongoing (interventions implemented as appropriate)

## 2015-11-05 NOTE — Progress Notes (Signed)
INTERNAL MEDICINE DAILY PROGRESS NOTE  Date: 11/05/2015  Time:  16:25      Leslie Hernandez is a 50yo F with DM2, CKD4, PE on coumadin, dCHF, and HTN who presented 5/20 for progressive right eye vision loss 2/2 vitreal hemorrhage about to undergo vitrectomy but found to have supratherapeutic INR in PACU so sent to ED where she was found to have dyspnea, cough, rhinorrhea, nausea, and vomiting with an INR 4.87. She was found to have a Rhinovirus URI as well as decompensated CHF and subsequently admitted for further medical management.     INTERVAL HISTORY:    - INR 2.89. Warfarin 6mg  x 1 today  - Phos still high, increased phoslo to 2 tab TIDQAC  - Coreg uptitrated to 12.5mg  BID, BP still not at goal  - GFR roughly stable.     SUBJECTIVE:    Says she is feeling better today, eye pain is improving. C/o constipation and "fear of going poop." Motivated to try to walk today.     MEDICATIONS:  Scheduled Medications  [START ON 11/06/2015] Acetaminophen (TYLENOL) Tablet 1,000 mg, ORAL, Q8H  Amlodipine (NORVASC) Tablet 10 mg, ORAL, QAM  Atorvastatin (LIPITOR) Tablet 80 mg, ORAL, Daily Bedtime  Brimonidine (ALPHAGAN) 0.2 % Ophthalmic Solution 1 drop, BOTH Eyes, TID  Calcium Acetate (PHOSLO) Capsule 1,334 mg, ORAL, TID w/ meals  Carvedilol (COREG) Tablet 12.5 mg, ORAL, BID w/ meals  Docusate (COLACE) Capsule 200 mg, ORAL, BID  FUROsemide (LASIX) Tablet 80 mg, ORAL, QAM  Gabapentin (NEURONTIN) Capsule 100 mg, ORAL, TID  HydrALAZINE (APRESOLINE) Tablet 100 mg, ORAL, TID  Insulin Aspart (NOVOLOG) Injection Pen 1-4 Units, SUBCUTANEOUS, Daily Bedtime  Insulin Aspart (NOVOLOG) Injection Pen 1-6 Units, SUBCUTANEOUS, TID w/ meals - correction dose  Insulin Glargine (LANTUS) Injection 8 Units, SUBCUTANEOUS, Daily Bedtime  Lactulose (CHRONULAC) 10 gram/15 mL Solution 30 mL, ORAL, QID  Lidocaine (LIDODERM) 5 %(700 mg/patch) Patch 2 patch, Transdermal, Q24H Now  Lidocaine patch REMOVAL 1 patch, Transdermal, Q24H Now  Neomycin/Polymyxin/Dexamethasone  (MAXITROL) Ophthalmic Ointment 0.5 inch, RIGHT Eye, Q12H Now  Polyethylene Glycol 3350 (MIRALAX) Oral Powder Packet 17 g, ORAL, QAM  Sennosides (SENOKOT) Tablet 17.2 mg, ORAL, Daily Bedtime  Warfarin Patient Flag, NOT APPLICABLE, PATIENT FLAG ONLY    IV Medications   PRN Medications  Albuterol (PROAIR HFA, PROVENTIL HFA, VENTOLIN HFA) Inhaler 1-2 puff, INHALATION, Q4H PRN  Bisacodyl (DULCOLAX) Suppository 10 mg, RECTALLY, Q24H PRN  Dextrose 50% Injection 12.5 g, IV, PRN  Dextrose 50% Injection 25 g, IV, PRN  Glucagon Injection 1 mg, IM, PRN  Guaifenesin 100mg -Dextromethorphan 10mg /77ml (ROBITUSSIN DM) Syrup 10 mL, ORAL, Q4H PRN  HydrALAZINE (APRESOLINE) Injection 10 mg, IV, Q4H PRN  Hydromorphone (DILAUDID) Injection 1-1.5 mg, IV, Q3H PRN  Hydromorphone (DILAUDID) Tablet 2 mg, ORAL, Q4H PRN  Mineral Oil (FLEET) Enema 1 enema, RECTALLY, Bedtime PRN  Ondansetron (ZOFRAN) Injection 4 mg, IV, Q8H PRN  Promethazine (PHENERGAN) Injection 12.5 mg, IV, Q6H PRN      OBJECTIVE:   Vital Signs  Summary  Temp Min: 36.7 C (98 F) Max: 37.1 C (98.7 F)  BP: (138-166)/(80-95)   Pulse Min: 78 Max: 86  Resp Min: 14 Max: 16  SpO2 Min: 98 % Max: 100 %      Current Vitals  Temp: 36.7 C (98 F)  BP: (!) 165/91  Pulse: 86  Resp: 16  SpO2: 98 %      Weight: 113.8 kg (250 lb 14.4 oz)     Intake and Output  Last Two Completed Shifts  In: 560 [Oral:560]  Out: 2300 [Urine:2300]    Current Shift  In: 230 [Oral:230]  Out: -     Physical Exam  General: obese female, No acute distress, conversational. A+Ox3  HEENT:  Moist oral mucosa.  R eye chronic blindness. Left pupil reactive to light.   CV: Regular rate and rhythm, nl s1s2, no murmurs, rubs or gallops. No JVD.  Pulm: Coarse BS 2/2 transmitted upper airway noises.  No appreciable rales or wheeze.  Abd: Soft NT ND, +BS. No rebound or guarding  Skin: No rashes. No jaundice.  Neuro: face symmetric, moving all 4 extremities spontaneously.    Extrem: trace LE ankle edema b/l.    LINES  AND DRAINS:   piv    LAB TESTS/STUDIES:   I personally reviewed the following report(s)  .  Lab Results - 24 hours (excluding micro and POC)   BASIC METABOLIC PANEL     Status: Abnormal   Result Value Status    SODIUM 138 Final    POTASSIUM 4.5 Final    CHLORIDE 107 Final    CARBON DIOXIDE TOTAL 21 (L) Final    UREA NITROGEN, BLOOD (BUN) 69 (H) Final    CREATININE BLOOD 4.14 (H) Final    E-GFR, AFRICAN AMERICAN 14 (L) Final    E-GFR, NON-AFRICAN AMERICAN 12 (L) Final    GLUCOSE 200 (H) Final    CALCIUM 8.6 Final   MAGNESIUM (MG)     Status: None   Result Value Status    MAGNESIUM (MG) 2.1 Final   PHOSPHORUS (PO4)     Status: Abnormal   Result Value Status    PHOSPHORUS (PO4) 5.6 (H) Final   INR     Status: Abnormal   Result Value Status    INR 2.89 (H) Final   CBC WITH DIFFERENTIAL     Status: Abnormal   Result Value Status    WHITE BLOOD CELL COUNT 4.5 Final    RED CELL COUNT 4.19 Final    HEMOGLOBIN 9.2 (L) Final    HEMATOCRIT 29.4 (L) Final    MCV 70.2 (L) Final    MCH 21.9 (L) Final    MCHC 31.1 (L) Final    RDW 18.8 (H) Final    MPV 7.6 Final    PLATELET COUNT 282 Final    NEUTROPHILS % AUTO 62.9 Final    LYMPHOCYTES % AUTO 26.1 Final    MONOCYTES % AUTO 6.3 Final    EOSINOPHIL % AUTO 3.7 Final    BASOPHILS % AUTO 1.0 Final    NEUTROPHIL ABS AUTO 2.80 Final    LYMPHOCYTE ABS AUTO 1.2 Final    MONOCYTES ABS AUTO 0.3 Final    EOSINOPHIL ABS AUTO 0.2 Final    BASOPHILS ABS AUTO 0 Final     INFLUENZA A Negative  Negative  Final     INFLUENZA B Negative  Negative  Final    RESPIRATORY SYNCYTIAL VIRUS Negative  Negative  Final    PARAINFLUENZA Negative  Negative  Final    CORONAVIRUS Negative  Negative  Final    HUMAN METAPNEUMOVIRUS Negative  Negative  Final    RHINOVIRUS/ENTEROVIRUS POSITIVE (Abnl) Negative  Final    ADENOVIRUS Negative  Negative  Final    HUMAN BOCAVIRUS Negative  Negative  Final    CHLAMYDOPHILA PNEUMONIAE Negative  Negative  Final    MYCOPLASMA PNEUMONIAE Negative  Negative  Final     Comment:  Xray left foot:  Flattening and fragmentation of the navicular bone, compatible with  subacute fracture. Regions of sclerosis within the navicular bone likely  reflect osteonecrosis.  Old healed fracture of the proximal fifth metatarsal.    ASSESSMENT & PLAN:    50 year old female past medical history of DM-2, CKD-4, PE on warfarin, dCHF, HTN with CHF exacerbation and subacute vitreal hemorrhage requiring ophtho retinal surgery which occurred but delayed due to supratherapeutic INR when admitted.    Glaucoma  Vitreal hemorrhage 2/2 supratherapeutic INR  Diabetic retinopathy  Right eye blindness  Transferred to [place] for urgent retinal surgery for elevated IOP but procedure delayed due to supratherapeutic INR and vitrious hemorrage. S/p vitrectomy and laser photocoagulation by [facility] on 5/25.   - continue brimonidine TID to both eyes, maxitrol tapered to R eye BID x 2 more weeks  - F/u ophthalmology in 3 weeks ([facility])    HTN  Persistently hypertensive. Already on hydralazine 100mg  TID, amlodipine 10mg  daily, and lasix 80mg  daily.   - increased coreg to 12.5mg  BID, uptitrate as needed    AKI on CKD  Acute on chronic heart failure, diastolic  Baseline Cre 2.3, presented with Cr 3.5 which rose with diuresis. Was given IVF with minimal improvement. Has symmetric 1-2+ pitting edema of lower extremities. BNP improved from 5/20, thus unlikely 2/2 cardiorenal. BP not controlled.   - Will attempt for better BP control, added coreg.   - renally dose medications and avoid nephrotoxins  - Monitor Cr  - continue lasix 80mg  po daily  - Fluid restriction, daily standing weight    Anemia of chronic disease  Iron studies suggestive of anemia of chronic disease.  No evidence of acute blood loss.    Cough  Rhinorhea, resovled  Patient with rhinovirus, but no longer with significant secretions. Cough improved. Isoaltion d/c.   - robitussin DM PRN    DM2  Peripheral neuropathy  Retinopathy  A1c in march was  9.8, sugars remain consistently above 200's. Repeat A1c in May was 7.6%.   Gabapentin reduced to 100mg  TID given renal function. Lantus added 6/1  - continue lantus 8u QHS + SSI with meals    PE (saddle embolus) status post IVC filter  supratherapeutic INR  [facility] with plan for lifelong AC given unprovoked and severity.  Feb V/Q without evidence of PE. Was on coumadin 20mg  daily resulting in supratherapeutic INR on presentation here.  Now with therapeutic INR  - [facility] pharmacy to follow  - warfarin 6mg  x 1 today    Constipation  - lactulose Q6H PRN  - routine bowel regimen    Left foot fracture  Recent Charcot's neuropathic osteoarthropathy left foot versus neuropathic fracture of the navicular bone and fifth metatarsal. Seen by ortho with worsening osteonecrosis on xray.  Patient must wear orthotics (CAM boot) boot when ambulating.   PT rec SNF  Patient will need follow up ortho outpt ~1wk following discharge.    FEN: renal/diabetic diet  DVT Prophylaxis: Coumadin  Code status: Full  Dispo: Medically stable for discharge [facility]    Erin Hearinglara Blanche Gallien, MD   Internal Medicine Attending  Hospitalist Division  Pager: [phone]  PI: [phone]

## 2015-11-05 NOTE — Nurse Focus (Signed)
[facility] person assist. Encouraged patient to stay out of bed and in a chair afterwards, however patient declined and stated she will get up a little later. She is tired at this time, and would like to go back to sleep. Early mobility explained to patient, and she stated she will try to sit in a chair later today.

## 2015-11-05 NOTE — Nurse Assessment (Signed)
ASSESSMENT NOTE    Note Started: 11/05/2015, 08:08     Initial assessment completed and recorded in EMR.  Report received from night shift nurse and orders reviewed. Patient set up for breakfast, c/o pain to right eye 8/10. Reports not feeling constipated but knows that she has not had a BM "for a while". Patient encouraged to mobilize more, amenable to sitting up in chair for lunch and dinner today. Plan of Care reviewed and appropriate, discussed with patient.  Charlett Langoanh Ryle Buscemi, RN

## 2015-11-05 NOTE — Nurse Assessment (Signed)
ASSESSMENT NOTE    Note Started: 11/05/2015, 21:47     Assumed pt. Care. Report received from Tim RN shift nurse and orders reviewed.   Plan of Care reviewed and appropriate, discussed with pt.   Chales Abrahamsominique Posey Jasmin,  RN

## 2015-11-05 NOTE — Nurse Assessment (Signed)
ASSESSMENT NOTE    Note Started: 11/05/2015, 19:16     Initial assessment completed and recorded in EMR.  Report received from day shift nurse and orders reviewed. Plan of Care reviewed and appropriate, discussed with patient. Pt calm and cooperative, resting in bed. BM x 1 at the beginning of shift large. Plan for today includes safety and comfort.  Salena Sanereok Bonny Egger, RN RN

## 2015-11-06 ENCOUNTER — Inpatient Hospital Stay (HOSPITAL_COMMUNITY): Payer: MEDICAID

## 2015-11-06 DIAGNOSIS — J988 Other specified respiratory disorders: Secondary | ICD-10-CM

## 2015-11-06 DIAGNOSIS — I517 Cardiomegaly: Secondary | ICD-10-CM

## 2015-11-06 LAB — CBC WITH DIFFERENTIAL
BASOPHILS % AUTO: 0.9 %
BASOPHILS ABS AUTO: 0 10*3/uL (ref 0–0.2)
EOSINOPHIL % AUTO: 2.9 %
EOSINOPHIL ABS AUTO: 0.1 10*3/uL (ref 0–0.5)
HEMATOCRIT: 29.4 % — AB (ref 34–46)
HEMOGLOBIN: 9.1 g/dL — AB (ref 12.0–16.0)
LYMPHOCYTE ABS AUTO: 0.2 10*3/uL — AB (ref 1.0–4.8)
LYMPHOCYTES % AUTO: 4.6 %
MCH: 21.8 pg — AB (ref 27–33)
MCHC: 31 % — AB (ref 32–36)
MCV: 70.4 UM3 — AB (ref 80–100)
MONOCYTES % AUTO: 4.8 %
MONOCYTES ABS AUTO: 0.2 10*3/uL (ref 0.1–0.8)
MPV: 7.5 UM3 (ref 6.8–10.0)
NEUTROPHIL ABS AUTO: 4 10*3/uL (ref 1.80–7.70)
NEUTROPHILS % AUTO: 86.8 %
PLATELET COUNT: 252 10*3/uL (ref 130–400)
RDW: 18.7 U — AB (ref 0–14.7)
RED CELL COUNT: 4.18 10*6/uL (ref 3.7–5.5)
WHITE BLOOD CELL COUNT: 4.7 10*3/uL (ref 4.5–11.0)

## 2015-11-06 LAB — BASIC METABOLIC PANEL
CALCIUM: 8.8 mg/dL (ref 8.6–10.5)
CARBON DIOXIDE TOTAL: 20 meq/L — AB (ref 24–32)
CHLORIDE: 107 meq/L (ref 95–110)
CREATININE BLOOD: 3.76 mg/dL — AB (ref 0.44–1.27)
E-GFR, AFRICAN AMERICAN: 16 SEE NOTE — AB (ref >60)
E-GFR, NON-AFRICAN AMERICAN: 13 — AB (ref >60)
GLUCOSE: 194 mg/dL — AB (ref 70–99)
POTASSIUM: 4.3 meq/L (ref 3.3–5.0)
SODIUM: 138 meq/L (ref 135–145)
UREA NITROGEN, BLOOD (BUN): 67 mg/dL — AB (ref 8–22)

## 2015-11-06 LAB — INR: INR: 2.93 — ABNORMAL HIGH (ref 0.87–1.18)

## 2015-11-06 LAB — PHOSPHORUS (PO4): Phosphorus (PO4): 5.1 mg/dL — ABNORMAL HIGH (ref 2.4–5.0)

## 2015-11-06 LAB — MAGNESIUM (MG): MAGNESIUM (MG): 2 mg/dL (ref 1.5–2.6)

## 2015-11-06 MED ORDER — WARFARIN 2 MG TABLET
5.0000 mg | ORAL_TABLET | ORAL | Status: AC
Start: 2015-11-06 — End: 2015-11-06
  Administered 2015-11-06: 5 mg via ORAL
  Filled 2015-11-06: qty 1

## 2015-11-06 MED ORDER — CAMPHOR-MENTHOL 0.5 %-0.5 % LOTION
TOPICAL_LOTION | Freq: Two times a day (BID) | TOPICAL | Status: DC | PRN
Start: 2015-11-06 — End: 2015-11-18
  Filled 2015-11-06: qty 222

## 2015-11-06 MED ORDER — DIPHENHYDRAMINE 25 MG CAPSULE
25.0000 mg | ORAL_CAPSULE | Freq: Once | ORAL | Status: AC
Start: 2015-11-06 — End: 2015-11-06
  Administered 2015-11-06: 25 mg via ORAL
  Filled 2015-11-06: qty 1

## 2015-11-06 NOTE — Nurse Assessment (Signed)
ASSESSMENT NOTE    Note Started: 11/06/2015, 08:01     Initial assessment completed and recorded in EMR.  Report received from night shift nurse and orders reviewed. Plan of Care reviewed and appropriate, discussed with patient.  Alan RipperJill Vashaun Osmon, RN RN

## 2015-11-06 NOTE — Allied Health Progress (Signed)
Intensive Case Management Follow Up Note    Comments: CM reviewed EMR; MD shared d/c summary and SNF orders.  [facility] at fax number:  fx [phone], ATT: Brandi.      CM received VM from [facility] at [facility], ph: [phone], who requested that he still needs [facility] SNF form completed and dated (he needs this dated every day the pt is pending SNF placement) faxed to him.      CM spoke with Dr. Threasa BeardsYang; had her review [facility] and sign and date.  She did so.      CM faxed the completed SNF orders ([facility]) [facility], Att: Clayburn PertEvan.          Plan: Await response from Nadara MustardBrandi or Evan at [facility], [place], [phone], fx [phone] re: SNF placement.        Date: 11/06/2015 Time: 09:43  Theotis BurrowKim Kalin Kyler, MSW Intensive Clinical Case Manager/ Discharge Planner  [phone], [phone]

## 2015-11-06 NOTE — Allied Health Consult (Signed)
PM&R -- ACUTE CARE  SERVICE  PHYSICAL THERAPY  INTERIM REPORT    Name: Junior Kenedy  MRN: [id number]   Date of Report: 11/06/2015     Initial Treatment Date:  10/23/15    Date of Admission: 10/20/2015  Date of Onset (Medicare only):  10/20/15    Diagnosis:  Left foot fracture         Precautions: Cam boot for ambulation    Primary Service:  (A) Hospital Medicine Faculty Service    Updated Present Condition (New Dx, Change in Status, New Procedures): none, 2 week interim  Pain: 0/10      Extremity Status: WFL    ROM MMT   RUE     LUE     RLE     LLE     Comments: left ankle NT, mostly global deconditioning 3-4/5       Functional Update:     Key:  Dep=Dependent     Max=Maximal     Mod=Moderate     Min=Minimal     CG=Contact Guard     SB=Stand-By     S=Supervised     I=Independent     NA=Not Applicable     NT=Not Tested     N=Normal     G=Good     F=Fair     P=Poor     U=Unable     FWW=Front-Wheeled Walker     AC=Axillary Crutches     SPC=Single-Point Cane       Level of Assistance Dep Max Mod Min CG SB S I NA NT Comments   Rolling          X      Scooting          X      Sidelying/ Supine to Sit        X   Uses rails, HOB up   Transfer Sit to Stand      X       FWW   Transfer Bed to Chair    X       FWW   Gait      X       32ft with FWW then patient began passing out   Up/Down Step/Stairs              Comments: required urgent W/C as patient began passing out while ambulatory, RN notified, BP: 108/58 post episode sitting in W/C     Patient / Caregiver Education Completed Thus Far:  Transfers, Cam boot required for OOB and ambulation    Assessment: Patient is a 50 year old female with history of left ankle fracture. From initial assessment, patient requires MIN A with transfers and short distance gait using a FWW.  Will benefit from short term skilled PT to improve strength and endurance and improve functional mobility to meet goals below.     Updated Physical Therapy Goals:    Transfers: sit to stand using a FWW with  SBA  Transfers: SBA using a FWW  Transfers: squat pivot transfer with SBA  Gait with FWW 167ft SBA  Patient education: maintain NWB of her left LE with mobility.  Wheelchair mobility: propel wheelchair using bilateral LEs x 100 feet with supervision    Updated Physical Therapy Treatment Plan:   Marine scientist / Training  Discharge Planning    Recommended Frequency of Treatment: Once a day    Recommended Duration  of Treatment: 1 week    Treatment Plan / Goals Discussed With Patient /Caregiver:  yes    Next Interim Report Due: 11/20/2015      Reported by:  Janeece Riggersavid Oaklyn Mans  P.T.  P.I. # [phone]

## 2015-11-06 NOTE — Op Note (Deleted)
Preop diagnosis: nonclearing vitreous hemorrhage with proliferative diabetic retinopathy and ghose cell glaucoma right eye      Postop diagnosis: same     Procedure:25g pars planar vitrectomy, endolaser panretinal laser photocoagulation right eye       Surgeon: Chancy HurterSusanna Talah Cookston, MD PhD  Assistant: Nash DimmerS Wong, MD    Anesthesia: MAC with retrobulbar block    Indication for surgery: For possible visual improvement and to minimize risk of further vision loss and for glaucoma control    Summary of procedure:    After obtaining written consent and reviewing the risk and benefits of surgery, including the risk of retinal detachment, bleeding, infection, loss of vision, loss of eye, etc, the patient's right eye was dilated preoperatively using 1% cyclogyl, 1% mydriacyl and 2.5% phenylephrine. The patient was brought to the OR where after confirming the site and procedure to be done, a retrobulbar block of 2% lidocaine and 0.75% marcaine was administered under intravenous sedation. The operated eye was prepped and drapped in the usual sterile manner. A lid speculum was placed and a 25g trocar was inserted 3.515mm behind the infratemporal limbus. The infusion line was inserted and turned on to form the globe. Two 25g trocars was inserted supranasally and supratemporally, 3.285mm behind the limbus.    Pars planar vitrectomy was performed to remove the cortical vitreous which contained blood. Once complete vitrectomy was completed, active aspiration was performed to confirm a complete removal of vitreous hemorrhage. The retina was noted to be attached throughout with scars from prior panretinal laser photocoagulation. Detailed exam of the peripheral retina did not show any new retinal breaks or detachment. Endolaser was applied to the peripheral retina 360 degrees as supplemental panretinal laser photocoagulation . A total of 1237 spots were applied.      The three trochars were removed and the two superior sclerotomies were found  to be leaking. A limited peritomy was created and the superior sclerotomies were closed with 7-0 vicryl. Overlying peritomy was closed with 7-0 vicyrl.  Dexamethasone and kezol was injected subconjunctival. Intraocular pressure was normal tactile.     Maxitrol and atropine oinments were applied. Lid speculum and drapes were removed and the eye was covered with a dressing and shield.     The patient was sent to the recovery room in stable condition. There were no complications    I was present for the entire procedure.    The information contained on this form is true and accurate to the best of my knowledge.  Further, I understand that if I misrepresent, falsify or conceal information regarding my participation in the professional service described above, I may be subject to fine, imprisonment, or civil penalty under applicable federal laws.    Cleola Perryman S. Ula Couvillon, MD PhD  Vitreo-retinal Specialist  Professor  Attending

## 2015-11-06 NOTE — Plan of Care (Signed)
Problem: Patient Care Overview (Adult)  Goal: Plan of Care Review  Outcome: Ongoing (interventions implemented as appropriate)  Goal Outcome Evaluation Note     Leslie Hernandez is a [age]yr female admitted 10/20/2015      OUTCOME SUMMARY AND PLAN MOVING FORWARD:     Pt sleepy this morning, low grade temp. Chest x-ray and UA ordered; refused breakfast. B/P occasionally elevated throughout the day; no PRN hydralazine administered/indicated. Up to bedside commode to void; no BM today. Pt walked some with PT today (some weakness while walking, B/P at 108 systolic after sitting back down); up in chair for a couple hours this evening. Dose of warfarin today; CBG covered for all meals. Continued discharge planning.   Goal: Individualization and Mutuality  Outcome: Ongoing (interventions implemented as appropriate)  Goal: Discharge Needs Assessment  Outcome: Ongoing (interventions implemented as appropriate)    Problem: Sleep Pattern Disturbance (Adult)  Goal: Adequate Sleep/Rest  Patient will demonstrate the desired outcomes by discharge/transition of care.   Outcome: Ongoing (interventions implemented as appropriate)    Problem: Fall Risk (Adult)  Goal: Absence of Falls  Patient will demonstrate the desired outcomes by discharge/transition of care.   Outcome: Ongoing (interventions implemented as appropriate)    Problem: Mobility, Physical Impaired (Adult)  Goal: Enhanced Mobility Skills  Patient will demonstrate the desired outcomes by discharge/transition of care.   Outcome: Ongoing (interventions implemented as appropriate)  Goal: Enhanced Functionality Ability  Patient will demonstrate the desired outcomes by discharge/transition of care.   Outcome: Ongoing (interventions implemented as appropriate)    Problem: Fluid Volume Excess (Adult,Obstetrics,Pediatric)  Goal: Stable Weight  Patient will demonstrate the desired outcomes by discharge/transition of care.   Outcome: Ongoing (interventions implemented as  appropriate)  Goal: Balanced Intake/Output  Patient will demonstrate the desired outcomes by discharge/transition of care.   Outcome: Ongoing (interventions implemented as appropriate)    Problem: Pain, Acute (Adult)  Goal: Acceptable Pain Control/Comfort Level  Patient will demonstrate the desired outcomes by discharge/transition of care.   Outcome: Ongoing (interventions implemented as appropriate)    Problem: Pressure Ulcer Risk (Braden Scale) (Adult,Obstetrics,Pediatric)  Goal: Skin Integrity  Patient will demonstrate the desired outcomes by discharge/transition of care.   Outcome: Ongoing (interventions implemented as appropriate)

## 2015-11-06 NOTE — Plan of Care (Signed)
Problem: Patient Care Overview (Adult)  Goal: Plan of Care Review  Outcome: Ongoing (interventions implemented as appropriate)  Goal Outcome Evaluation Note     Leslie Hernandez is a [age]yr female admitted 10/20/2015      OUTCOME SUMMARY AND PLAN MOVING FORWARD:   Pt. With significant 8/10 pain to R eye not improved with PO dilaudid, improved with IV dilaudid x1. PRN benadryl x1 for c/o itching, improved per pt. Large, loose incontinent BM x2 this shift, cleaned pt. And changed, per pt. BM just "flowing out."  Pt. Able to ambulate with one person assist to bedside chair with steady gait. Pt. Slept well in between care, complied with 1.5L FR and remained within behavioral boundaries. SBP elevated to 178, improved spontaneously and did not require PRN hydralazine administration, otherwise VSS. [facility]   Goal: Individualization and Mutuality  Outcome: Ongoing (interventions implemented as appropriate)  Goal: Discharge Needs Assessment  Outcome: Ongoing (interventions implemented as appropriate)    Problem: Sleep Pattern Disturbance (Adult)  Goal: Adequate Sleep/Rest  Patient will demonstrate the desired outcomes by discharge/transition of care.   Outcome: Ongoing (interventions implemented as appropriate)    Problem: Fall Risk (Adult)  Goal: Absence of Falls  Patient will demonstrate the desired outcomes by discharge/transition of care.   Outcome: Ongoing (interventions implemented as appropriate)    Problem: Mobility, Physical Impaired (Adult)  Goal: Enhanced Mobility Skills  Patient will demonstrate the desired outcomes by discharge/transition of care.   Outcome: Ongoing (interventions implemented as appropriate)  Goal: Enhanced Functionality Ability  Patient will demonstrate the desired outcomes by discharge/transition of care.   Outcome: Ongoing (interventions implemented as appropriate)    Problem: Fluid Volume Excess (Adult,Obstetrics,Pediatric)  Goal: Stable Weight  Patient will  demonstrate the desired outcomes by discharge/transition of care.   Outcome: Ongoing (interventions implemented as appropriate)  Goal: Balanced Intake/Output  Patient will demonstrate the desired outcomes by discharge/transition of care.   Outcome: Ongoing (interventions implemented as appropriate)    Problem: Pain, Acute (Adult)  Goal: Acceptable Pain Control/Comfort Level  Patient will demonstrate the desired outcomes by discharge/transition of care.   Outcome: Ongoing (interventions implemented as appropriate)

## 2015-11-06 NOTE — Progress Notes (Signed)
INTERNAL MEDICINE DAILY PROGRESS NOTE  Date: 11/06/2015  Time:  16:53      [name] is a 50yo F with DM2, CKD4, PE on coumadin, dCHF, and HTN who presented 5/20 for progressive right eye vision loss 2/2 vitreal hemorrhage about to undergo vitrectomy but found to have supratherapeutic INR in PACU so sent to ED where she was found to have dyspnea, cough, rhinorrhea, nausea, and vomiting with an INR 4.87. She was found to have a Rhinovirus URI as well as decompensated CHF and subsequently admitted for further medical management.     INTERVAL HISTORY:    - Warfarin  x 1 ordered today. INR 2.93  - IV dilaudid d/c due to somnolence  - Improved BP control 110-170s over last 24 hours  - Creatinine/GFR stable    SUBJECTIVE:    Seemed slightly delirious today. Had low grade temp of 100F. Repeat CXR shows stable  cardiomegaly, slightly increased interstitial markings     MEDICATIONS:  Scheduled Medications  Acetaminophen (TYLENOL) Tablet 1,000 mg, ORAL, Q8H  Amlodipine (NORVASC) Tablet 10 mg, ORAL, QAM  Atorvastatin (LIPITOR) Tablet 80 mg, ORAL, Daily Bedtime  Brimonidine (ALPHAGAN) 0.2 % Ophthalmic Solution 1 drop, BOTH Eyes, TID  Calcium Acetate (PHOSLO) Capsule 1,334 mg, ORAL, TID w/ meals  Carvedilol (COREG) Tablet 12.5 mg, ORAL, BID w/ meals  Docusate (COLACE) Capsule 200 mg, ORAL, BID  FUROsemide (LASIX) Tablet 80 mg, ORAL, QAM  Gabapentin (NEURONTIN) Capsule 100 mg, ORAL, TID  HydrALAZINE (APRESOLINE) Tablet 100 mg, ORAL, TID  Insulin Aspart (NOVOLOG) Injection Pen 1-4 Units, SUBCUTANEOUS, Daily Bedtime  Insulin Aspart (NOVOLOG) Injection Pen 1-6 Units, SUBCUTANEOUS, TID w/ meals - correction dose  Insulin Glargine (LANTUS) Injection 8 Units, SUBCUTANEOUS, Daily Bedtime  Lactulose (CHRONULAC) 10 gram/15 mL Solution 30 mL, ORAL, QID  Lidocaine (LIDODERM) 5 %(700 mg/patch) Patch 2 patch, Transdermal, Q24H Now  Lidocaine patch REMOVAL 1 patch, Transdermal, Q24H Now  Neomycin/Polymyxin/Dexamethasone (MAXITROL) Ophthalmic  Ointment 0.5 inch, RIGHT Eye, Q12H Now  Polyethylene Glycol 3350 (MIRALAX) Oral Powder Packet 17 g, ORAL, QAM  Sennosides (SENOKOT) Tablet 17.2 mg, ORAL, Daily Bedtime  Warfarin Patient Flag, NOT APPLICABLE, PATIENT FLAG ONLY    IV Medications   PRN Medications  Albuterol (PROAIR HFA, PROVENTIL HFA, VENTOLIN HFA) Inhaler 1-2 puff, INHALATION, Q4H PRN  Bisacodyl (DULCOLAX) Suppository 10 mg, RECTALLY, Q24H PRN  Dextrose 50% Injection 12.5 g, IV, PRN  Dextrose 50% Injection 25 g, IV, PRN  Glucagon Injection 1 mg, IM, PRN  Guaifenesin -Dextromethorphan /16ml (ROBITUSSIN DM) Syrup 10 mL, ORAL, Q4H PRN  HydrALAZINE (APRESOLINE) Injection 10 mg, IV, Q4H PRN  Hydromorphone (DILAUDID) Tablet 2 mg, ORAL, Q4H PRN  Mineral Oil (FLEET) Enema 1 enema, RECTALLY, Bedtime PRN  Ondansetron (ZOFRAN) Injection 4 mg, IV, Q8H PRN  Promethazine (PHENERGAN) Injection 12.5 mg, IV, Q6H PRN      OBJECTIVE:   Vital Signs  Summary  Temp Min: 36.4 C (97.6 F) Max: 37.8 C (100 F)  BP: (111-178)/(71-88)   Pulse Min: 70 Max: 92  Resp Min: 16 Max: 20  SpO2 Min: 94 % Max: 99 %      Current Vitals  Temp: 36.9 C (98.5 F)  BP: 153/82  Pulse: 84  Resp: 18  SpO2: 98 %      Weight: 107 kg (236 lb)     Intake and Output  Last Two Completed Shifts  In: 380 [Oral:380]  Out: 1200 [Urine:1200]    Current Shift  In: 560 [Oral:560]  Out: 600 [  Urine:600]    Physical Exam  General: obese female, No acute distress, appears sleepy but arousable  HEENT:  Moist oral mucosa.  R eye chronic blindness. Left pupil reactive to light.   CV: Regular rate and rhythm, nl s1s2, no murmurs, rubs or gallops. No JVD.  Pulm: Coarse BS 2/2 transmitted upper airway noises.  No appreciable rales or wheeze.  Abd: Soft NT ND, +BS. No rebound or guarding  Skin: No rashes. No jaundice.  Neuro: face symmetric, moving all 4 extremities spontaneously.    Extrem: trace LE ankle edema b/l.    LINES AND DRAINS:   piv    LAB TESTS/STUDIES:   I personally reviewed the  following report(s)  .  Lab Results - 24 hours (excluding micro and POC)   BASIC METABOLIC PANEL     Status: Abnormal   Result Value Status    SODIUM 138 Final    POTASSIUM 4.3 Final    CHLORIDE 107 Final    CARBON DIOXIDE TOTAL 20 (L) Final    UREA NITROGEN, BLOOD (BUN) 67 (H) Final    CREATININE BLOOD 3.76 (H) Final    E-GFR, AFRICAN AMERICAN 16 (L) Final    E-GFR, NON-AFRICAN AMERICAN 13 (L) Final    GLUCOSE 194 (H) Final    CALCIUM 8.8 Final   MAGNESIUM (MG)     Status: None   Result Value Status    MAGNESIUM (MG) 2.0 Final   PHOSPHORUS (PO4)     Status: Abnormal   Result Value Status    PHOSPHORUS (PO4) 5.1 (H) Final   INR     Status: Abnormal   Result Value Status    INR 2.93 (H) Final   CBC WITH DIFFERENTIAL     Status: Abnormal   Result Value Status    WHITE BLOOD CELL COUNT 4.7 Final    RED CELL COUNT 4.18 Final    HEMOGLOBIN 9.1 (L) Final    HEMATOCRIT 29.4 (L) Final    MCV 70.4 (L) Final    MCH 21.8 (L) Final    MCHC 31.0 (L) Final    RDW 18.7 (H) Final    MPV 7.5 Final    PLATELET COUNT 252 Final    NEUTROPHILS % AUTO 86.8 Final    LYMPHOCYTES % AUTO 4.6 Final    MONOCYTES % AUTO 4.8 Final    EOSINOPHIL % AUTO 2.9 Final    BASOPHILS % AUTO 0.9 Final    NEUTROPHIL ABS AUTO 4.00 Final    LYMPHOCYTE ABS AUTO 0.2 (L) Final    MONOCYTES ABS AUTO 0.2 Final    EOSINOPHIL ABS AUTO 0.1 Final    BASOPHILS ABS AUTO 0 Final     INFLUENZA A Negative  Negative  Final     INFLUENZA B Negative  Negative  Final    RESPIRATORY SYNCYTIAL VIRUS Negative  Negative  Final    PARAINFLUENZA Negative  Negative  Final    CORONAVIRUS Negative  Negative  Final    HUMAN METAPNEUMOVIRUS Negative  Negative  Final    RHINOVIRUS/ENTEROVIRUS POSITIVE (Abnl) Negative  Final    ADENOVIRUS Negative  Negative  Final    HUMAN BOCAVIRUS Negative  Negative  Final    CHLAMYDOPHILA PNEUMONIAE Negative  Negative  Final    MYCOPLASMA PNEUMONIAE Negative  Negative  Final    Comment:       Xray left foot:  Flattening and fragmentation of  the navicular bone, compatible with  subacute fracture. Regions of sclerosis within  the navicular bone likely  reflect osteonecrosis.  Old healed fracture of the proximal fifth metatarsal.    CXR 11/06/15  IMPRESSION:  STABLE CARDIOMEGALY. CONSIDER PERICARDIAL EFFUSION.  MILD INCREASE IN INTERSTITIAL MARKINGS, ESPECIALLY IN THE LEFT PERIHILAR  REGION AND LUNG BASE. THE FINDINGS ARE COMPATIBLE WITH PULMONARY EDEMA BUT A BILATERAL INFECTIOUS PROCESS COULD HAVE A SIMILAR APPEARANCE    ASSESSMENT & PLAN:    50 year old female past medical history of DM-2, CKD-4, PE on warfarin, dCHF, HTN with CHF exacerbation and subacute vitreal hemorrhage requiring ophtho retinal surgery which occurred but delayed due to supratherapeutic INR when admitted.    Glaucoma  Vitreal hemorrhage 2/2 supratherapeutic INR  Diabetic retinopathy  Right eye blindness  Transferred to [place] for urgent retinal surgery for elevated IOP but procedure delayed due to supratherapeutic INR and vitrious hemorrage. S/p vitrectomy and laser photocoagulation by [facility] on 5/25.   - continue brimonidine TID to both eyes, maxitrol tapered to R eye BID x 2 more weeks  - F/u ophthalmology in 3 weeks (vs Mikael Spray)  - d/c IV dilaudid due to delirium and somnolence  - continue po dilaudid 2mg  Q4H PRN for pain    HTN  Persistently hypertensive. Already on hydralazine 100mg  TID, amlodipine 10mg  daily, and lasix 80mg  daily.   - Started on coreg in-house with improving BP control. Continue coreg 12.5mg  BID    AKI on CKD  Acute on chronic heart failure, diastolic  Baseline Cre 2.3, presented with Cr 3.5 which rose with diuresis. Was given IVF with minimal improvement. Has symmetric 1-2+ pitting edema of lower extremities. BNP improved from 5/20, thus unlikely 2/2 cardiorenal. BP not controlled, but improving.    - continue sevelamer for hyperphosphatemia  - renally dose medications and avoid nephrotoxins  - Monitor Cr  - continue lasix 80mg  po daily   - Fluid  restriction, daily standing weight    Anemia of chronic disease  Iron studies suggestive of anemia of chronic disease.  No evidence of acute blood loss.    Cough  Rhinorhea, resovled  Patient with rhinovirus, but no longer with significant secretions. Cough improved. Isoaltion d/c.   - robitussin DM PRN    DM2  Peripheral neuropathy  Retinopathy  A1c in march was 9.8, sugars remain consistently above 200's. Repeat A1c in May was 7.6%.   Gabapentin reduced to 100mg  TID given renal function. Lantus added 6/1  - continue lantus 8u QHS + SSI with meals    PE (saddle embolus) status post IVC filter  supratherapeutic INR  The PE was diagnosed 3 years ago at [facility] with IVC placed with plan for lifelong AC given unprovoked and severity.  Feb V/Q without evidence of PE. Was on coumadin 20mg  daily resulting in supratherapeutic INR on presentation here.  Now with therapeutic INR  - [facility] pharmacy to follow  - warfarin 5mg  x 1 today    Constipation  - lactulose Q6H PRN  - routine bowel regimen    Left foot fracture  Recent Charcot's neuropathic osteoarthropathy left foot versus neuropathic fracture of the navicular bone and fifth metatarsal. Seen by ortho with worsening osteonecrosis on xray.  Patient must wear orthotics (CAM boot) boot when ambulating.   PT rec SNF  Patient will need follow up ortho outpt ~1wk following discharge.    FEN: renal/diabetic diet  DVT Prophylaxis: Coumadin  Code status: Full  Dispo: Medically stable for discharge back to SNF, [facility] without available beds  Caswell Corwin, MD   Internal Medicine Attending  Hospitalist Division  Pager: [phone]  PI: [phone]

## 2015-11-07 DIAGNOSIS — R079 Chest pain, unspecified: Secondary | ICD-10-CM

## 2015-11-07 LAB — BASIC METABOLIC PANEL
CALCIUM: 8.4 mg/dL — AB (ref 8.6–10.5)
CARBON DIOXIDE TOTAL: 21 meq/L — AB (ref 24–32)
CHLORIDE: 108 meq/L (ref 95–110)
CREATININE BLOOD: 3.98 mg/dL — AB (ref 0.44–1.27)
E-GFR, AFRICAN AMERICAN: 15 SEE NOTE — AB (ref >60)
E-GFR, NON-AFRICAN AMERICAN: 13 — AB (ref >60)
GLUCOSE: 185 mg/dL — AB (ref 70–99)
POTASSIUM: 4.3 meq/L (ref 3.3–5.0)
SODIUM: 136 meq/L (ref 135–145)
UREA NITROGEN, BLOOD (BUN): 66 mg/dL — AB (ref 8–22)

## 2015-11-07 LAB — URINALYSIS AND CULTURE IF IND
Bilirubin Urine: NEGATIVE
Glucose Urine: 500 mg/dL
Hyaline Casts: 1 /LPF (ref 0–5)
Ketones: NEGATIVE mg/dL
Leuk. Esterase: NEGATIVE
Nitrite Urine: NEGATIVE
Protein Urine: 300 mg/dL — AB
RBC: 11 /HPF — ABNORMAL HIGH (ref 0–5)
Specific Gravity: 1.012 (ref 1.002–1.030)
Squamous EPI: 3 /HPF (ref 0–5)
Urobilinogen.: NEGATIVE mg/dL (ref ?–2.0)
WBC: 7 /HPF — ABNORMAL HIGH (ref 0–5)
pH URINE: 6 (ref 4.8–7.8)

## 2015-11-07 LAB — CBC WITH DIFFERENTIAL
BASOPHILS % AUTO: 0.5 %
BASOPHILS ABS AUTO: 0 10*3/uL (ref 0–0.2)
EOSINOPHIL % AUTO: 5.9 %
EOSINOPHIL ABS AUTO: 0.2 10*3/uL (ref 0–0.5)
HEMATOCRIT: 25.5 % — AB (ref 34–46)
HEMOGLOBIN: 8.1 g/dL — AB (ref 12.0–16.0)
LYMPHOCYTE ABS AUTO: 0.6 10*3/uL — AB (ref 1.0–4.8)
LYMPHOCYTES % AUTO: 23.3 %
MCH: 22.1 pg — AB (ref 27–33)
MCHC: 31.8 % — AB (ref 32–36)
MCV: 69.5 UM3 — AB (ref 80–100)
MONOCYTES % AUTO: 8.9 %
MONOCYTES ABS AUTO: 0.2 10*3/uL (ref 0.1–0.8)
MPV: 7.3 UM3 (ref 6.8–10.0)
NEUTROPHIL ABS AUTO: 1.7 10*3/uL — AB (ref 1.80–7.70)
NEUTROPHILS % AUTO: 61.4 %
PLATELET COUNT: 215 10*3/uL (ref 130–400)
RDW: 19 U — AB (ref 0–14.7)
RED CELL COUNT: 3.67 10*6/uL — AB (ref 3.7–5.5)
WHITE BLOOD CELL COUNT: 2.8 10*3/uL — AB (ref 4.5–11.0)

## 2015-11-07 LAB — PHOSPHORUS (PO4): PHOSPHORUS (PO4): 4.6 mg/dL (ref 2.4–5.0)

## 2015-11-07 LAB — INR: INR: 3.84 — AB (ref 0.87–1.18)

## 2015-11-07 LAB — MAGNESIUM (MG): MAGNESIUM (MG): 1.9 mg/dL (ref 1.5–2.6)

## 2015-11-07 MED ORDER — WARFARIN 2 MG TABLET
2.0000 mg | ORAL_TABLET | ORAL | Status: AC
Start: 2015-11-07 — End: 2015-11-07
  Administered 2015-11-07: 2 mg via ORAL
  Filled 2015-11-07: qty 1

## 2015-11-07 NOTE — Nurse Assessment (Signed)
ASSESSMENT NOTE    Note Started: 11/07/2015, 07:53     Initial assessment completed and recorded in EMR.  Report received from night shift nurse and orders reviewed. Patient feeling unrested this AM, states she did not get good sleep last night. Has requested to allow her to sleep in a bit longer this morning. Plan of Care reviewed and appropriate, discussed with patient.  Charlett Langoanh Eliaz Fout, RN

## 2015-11-07 NOTE — Nurse Assessment (Signed)
ASSESSMENT NOTE    Note Started: 11/07/2015, 19:45     Initial assessment completed and recorded in EMR.  Report received from day shift nurse and orders reviewed. Plan of Care reviewed and appropriate, discussed with patient.  Doree BarthelEvelyn Correa, LVN Other (specify): LVN

## 2015-11-07 NOTE — Nurse Assessment (Signed)
I endorse Evelyn's assessment of the patient as accurate and true. Madelin Headingsctavio, RN
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
## 2015-11-07 NOTE — Plan of Care (Addendum)
Problem: Patient Care Overview (Adult)  Goal: Plan of Care Review  Outcome: Ongoing (interventions implemented as appropriate)  Goal Outcome Evaluation Note     Leslie Hernandez is a [age]yr female admitted 10/20/2015      OUTCOME SUMMARY AND PLAN MOVING FORWARD:   Patient with VSS, UA and culture sent, EKG collected for chest pain that resolved on its own, showered today with minimal assist. Declining BM meds because of loose stool. PRN Dilaudid x1 for pain to R eye. Droplet precautions discontinued. Pending placement.       Goal: Individualization and Mutuality  Outcome: Ongoing (interventions implemented as appropriate)  Goal: Discharge Needs Assessment  Outcome: Ongoing (interventions implemented as appropriate)    Problem: Sleep Pattern Disturbance (Adult)  Goal: Adequate Sleep/Rest  Patient will demonstrate the desired outcomes by discharge/transition of care.   Outcome: Ongoing (interventions implemented as appropriate)    Problem: Fall Risk (Adult)  Goal: Absence of Falls  Patient will demonstrate the desired outcomes by discharge/transition of care.   Outcome: Ongoing (interventions implemented as appropriate)    Problem: Mobility, Physical Impaired (Adult)  Goal: Enhanced Mobility Skills  Patient will demonstrate the desired outcomes by discharge/transition of care.   Outcome: Ongoing (interventions implemented as appropriate)  Goal: Enhanced Functionality Ability  Patient will demonstrate the desired outcomes by discharge/transition of care.   Outcome: Ongoing (interventions implemented as appropriate)    Problem: Fluid Volume Excess (Adult,Obstetrics,Pediatric)  Goal: Stable Weight  Patient will demonstrate the desired outcomes by discharge/transition of care.   Outcome: Ongoing (interventions implemented as appropriate)  Goal: Balanced Intake/Output  Patient will demonstrate the desired outcomes by discharge/transition of care.   Outcome: Ongoing (interventions implemented as appropriate)    Problem: Pain,  Acute (Adult)  Goal: Acceptable Pain Control/Comfort Level  Patient will demonstrate the desired outcomes by discharge/transition of care.   Outcome: Ongoing (interventions implemented as appropriate)    Problem: Pressure Ulcer Risk (Braden Scale) (Adult,Obstetrics,Pediatric)  Goal: Skin Integrity  Patient will demonstrate the desired outcomes by discharge/transition of care.   Outcome: Ongoing (interventions implemented as appropriate)

## 2015-11-07 NOTE — Progress Notes (Signed)
INTERNAL MEDICINE DAILY PROGRESS NOTE  Date: 11/07/2015  Time:  17:04      Leslie Hernandez is a 50yo F with DM2, CKD4, PE on coumadin, dCHF, and HTN who presented 5/20 for progressive right eye vision loss 2/2 vitreal hemorrhage about to undergo vitrectomy but found to have supratherapeutic INR in PACU so sent to ED where she was found to have dyspnea, cough, rhinorrhea, nausea, and vomiting with an INR 4.87. She was found to have a Rhinovirus URI as well as decompensated CHF and subsequently admitted for further medical management.     INTERVAL HISTORY:    - INR jumped today to 3.84. Warfarin  x 1 today  - C/o chest pain today that she described a pressure, located above left upper chest wall, reproducible with chest wall palpation. EKG unremarkable. Resolved on its own.   - IV dilaudid d/c due to somnolence  - GFR/Cre stable    SUBJECTIVE:   As noted above. C/o chest pain, chest wall tenderness. Denies SOB, cough ,fevers/chills. Had loose stools with lactulose. Denies dysuria.     MEDICATIONS:  Scheduled Medications  Acetaminophen (TYLENOL) Tablet 1,000 mg, ORAL, Q8H  Amlodipine (NORVASC) Tablet 10 mg, ORAL, QAM  Atorvastatin (LIPITOR) Tablet 80 mg, ORAL, Daily Bedtime  Brimonidine (ALPHAGAN) 0.2 % Ophthalmic Solution 1 drop, BOTH Eyes, TID  Calcium Acetate (PHOSLO) Capsule 1,334 mg, ORAL, TID w/ meals  Carvedilol (COREG) Tablet 12.5 mg, ORAL, BID w/ meals  Docusate (COLACE) Capsule 200 mg, ORAL, BID  FUROsemide (LASIX) Tablet 80 mg, ORAL, QAM  Gabapentin (NEURONTIN) Capsule 100 mg, ORAL, TID  HydrALAZINE (APRESOLINE) Tablet 100 mg, ORAL, TID  Insulin Aspart (NOVOLOG) Injection Pen 1-4 Units, SUBCUTANEOUS, Daily Bedtime  Insulin Aspart (NOVOLOG) Injection Pen 1-6 Units, SUBCUTANEOUS, TID w/ meals - correction dose  Insulin Glargine (LANTUS) Injection 8 Units, SUBCUTANEOUS, Daily Bedtime  Lactulose (CHRONULAC) 10 gram/15 mL Solution 30 mL, ORAL, QID  Lidocaine (LIDODERM) 5 %(700 mg/patch) Patch 2 patch, Transdermal, Q24H  Now  Lidocaine patch REMOVAL 1 patch, Transdermal, Q24H Now  Neomycin/Polymyxin/Dexamethasone (MAXITROL) Ophthalmic Ointment 0.5 inch, RIGHT Eye, Q12H Now  Polyethylene Glycol 3350 (MIRALAX) Oral Powder Packet 17 g, ORAL, QAM  Sennosides (SENOKOT) Tablet 17.2 mg, ORAL, Daily Bedtime  Warfarin Patient Flag, NOT APPLICABLE, PATIENT FLAG ONLY    IV Medications   PRN Medications  Albuterol (PROAIR HFA, PROVENTIL HFA, VENTOLIN HFA) Inhaler 1-2 puff, INHALATION, Q4H PRN  Bisacodyl (DULCOLAX) Suppository 10 mg, RECTALLY, Q24H PRN  Camphor/Menthol (SARNA) Lotion, TOPICAL, BID prn  Dextrose 50% Injection 12.5 g, IV, PRN  Dextrose 50% Injection 25 g, IV, PRN  Glucagon Injection 1 mg, IM, PRN  Guaifenesin -Dextromethorphan /75ml (ROBITUSSIN DM) Syrup 10 mL, ORAL, Q4H PRN  HydrALAZINE (APRESOLINE) Injection 10 mg, IV, Q4H PRN  Hydromorphone (DILAUDID) Tablet 2 mg, ORAL, Q4H PRN  Mineral Oil (FLEET) Enema 1 enema, RECTALLY, Bedtime PRN  Ondansetron (ZOFRAN) Injection 4 mg, IV, Q8H PRN  Promethazine (PHENERGAN) Injection 12.5 mg, IV, Q6H PRN      OBJECTIVE:   Vital Signs  Summary  Temp Min: 36.8 C (98.3 F) Max: 37.3 C (99.1 F)  BP: (119-136)/(76-83)   Pulse Min: 68 Max: 77  Resp Min: 18 Max: 18  SpO2 Min: 98 % Max: 100 %      Current Vitals  Temp: 37 C (98.6 F)  BP: 119/76  Pulse: 68  Resp: 18  SpO2: 98 %      Weight: 107 kg (236 lb)     Intake  and Output  Last Two Completed Shifts  In: 800 [Oral:800]  Out: 1250 [Urine:1250]    Current Shift  In: 200 [Oral:200]  Out: 800 [Urine:800]    Physical Exam  General: obese female, No acute distress, appears sleepy but arousable  HEENT:  Moist oral mucosa.  R eye chronic blindness. Left pupil reactive to light.   CV: Regular rate and rhythm, nl s1s2, no murmurs, rubs or gallops. No JVD.  Pulm: Coarse BS 2/2 transmitted upper airway noises.  No appreciable rales or wheeze.  Abd: Soft NT ND, +BS. No rebound or guarding  Skin: No rashes. No jaundice.  Neuro: face  symmetric, moving all 4 extremities spontaneously.    Extrem: trace LE ankle edema b/l.    LINES AND DRAINS:   piv    LAB TESTS/STUDIES:   I personally reviewed the following report(s)  .  Lab Results - 24 hours (excluding micro and POC)   BASIC METABOLIC PANEL     Status: Abnormal   Result Value Status    SODIUM 136 Final    POTASSIUM 4.3 Final    CHLORIDE 108 Final    CARBON DIOXIDE TOTAL 21 (L) Final    UREA NITROGEN, BLOOD (BUN) 66 (H) Final    CREATININE BLOOD 3.98 (H) Final    E-GFR, AFRICAN AMERICAN 15 (L) Final    E-GFR, NON-AFRICAN AMERICAN 13 (L) Final    GLUCOSE 185 (H) Final    CALCIUM 8.4 (L) Final   MAGNESIUM (MG)     Status: None   Result Value Status    MAGNESIUM (MG) 1.9 Final   PHOSPHORUS (PO4)     Status: None   Result Value Status    PHOSPHORUS (PO4) 4.6 Final   INR     Status: Abnormal   Result Value Status    INR 3.84 (H) Final   CBC WITH DIFFERENTIAL     Status: Abnormal   Result Value Status    WHITE BLOOD CELL COUNT 2.8 (L) Final    RED CELL COUNT 3.67 (L) Final    HEMOGLOBIN 8.1 (L) Final    HEMATOCRIT 25.5 (L) Final    MCV 69.5 (L) Final    MCH 22.1 (L) Final    MCHC 31.8 (L) Final    RDW 19.0 (H) Final    MPV 7.3 Final    PLATELET COUNT 215 Final    NEUTROPHILS % AUTO 61.4 Final    LYMPHOCYTES % AUTO 23.3 Final    MONOCYTES % AUTO 8.9 Final    EOSINOPHIL % AUTO 5.9 Final    BASOPHILS % AUTO 0.5 Final    NEUTROPHIL ABS AUTO 1.70 (L) Final    LYMPHOCYTE ABS AUTO 0.6 (L) Final    MONOCYTES ABS AUTO 0.2 Final    EOSINOPHIL ABS AUTO 0.2 Final    BASOPHILS ABS AUTO 0 Final   URINALYSIS AND CULTURE IF IND     Status: Abnormal   Result Value Status    COLLECTION Clean Catch Final    COLOR Yellow Final    CLARITY Clear Final    SPECIFIC GRAVITY 1.012 Final    pH URINE 6.0 Final    OCCULT BLOOD URINE TRACE (Abnl) Final    BILIRUBIN URINE Negative Final    KETONES Negative Final    GLUCOSE URINE 500 Final    PROTEIN URINE 300 (Abnl) Final    UROBILINOGEN. Negative Final    NITRITE URINE Negative  Final    LEUK. ESTERASE Negative Final  MICROSCOPIC INDICATED Final    WBC 7 (H) Final    RBC 11 (H) Final    BACTERIA/HPF Few Final    SQUAMOUS EPI 3 Final    MUCOUS/LPF Few Final    HYALINE CASTS 1 Final    URINE CULTURE INDICATED (Abnl) Final     INFLUENZA A Negative  Negative  Final     INFLUENZA B Negative  Negative  Final    RESPIRATORY SYNCYTIAL VIRUS Negative  Negative  Final    PARAINFLUENZA Negative  Negative  Final    CORONAVIRUS Negative  Negative  Final    HUMAN METAPNEUMOVIRUS Negative  Negative  Final    RHINOVIRUS/ENTEROVIRUS POSITIVE (Abnl) Negative  Final    ADENOVIRUS Negative  Negative  Final    HUMAN BOCAVIRUS Negative  Negative  Final    CHLAMYDOPHILA PNEUMONIAE Negative  Negative  Final    MYCOPLASMA PNEUMONIAE Negative  Negative  Final    Comment:       Xray left foot:  Flattening and fragmentation of the navicular bone, compatible with  subacute fracture. Regions of sclerosis within the navicular bone likely  reflect osteonecrosis.  Old healed fracture of the proximal fifth metatarsal.    CXR 11/06/15  IMPRESSION:  STABLE CARDIOMEGALY. CONSIDER PERICARDIAL EFFUSION.  MILD INCREASE IN INTERSTITIAL MARKINGS, ESPECIALLY IN THE LEFT PERIHILAR  REGION AND LUNG BASE. THE FINDINGS ARE COMPATIBLE WITH PULMONARY EDEMA BUT A BILATERAL INFECTIOUS PROCESS COULD HAVE A SIMILAR APPEARANCE    ASSESSMENT & PLAN:    50 year old female past medical history of DM-2, CKD-4, PE on warfarin, dCHF, HTN with CHF exacerbation and subacute vitreal hemorrhage requiring ophtho retinal surgery which occurred but delayed due to supratherapeutic INR when admitted.    Glaucoma  Vitreal hemorrhage 2/2 supratherapeutic INR  Diabetic retinopathy  Right eye blindness  Transferred to [facility] for urgent retinal surgery for elevated IOP but procedure delayed due to supratherapeutic INR and vitrious hemorrage. S/p vitrectomy and laser photocoagulation by [facility] on 5/25.   - continue brimonidine TID to both eyes,  maxitrol tapered to R eye BID x 2 more weeks  - F/u ophthalmology in 3 weeks ([place] [facility])  - continue po dilaudid 2mg  Q4H PRN for pain    HTN  Persistently hypertensive. Already on hydralazine 100mg  TID, amlodipine 10mg  daily, and lasix 80mg  daily.   - Started on coreg in-house with improving BP control. Continue coreg 12.5mg  BID    AKI on CKD  Acute on chronic heart failure, diastolic  Baseline Cre 2.3, presented with Cr 3.5 which rose with diuresis. Was given IVF with minimal improvement. Has symmetric 1-2+ pitting edema of lower extremities. BNP improved from 5/20, thus unlikely 2/2 cardiorenal. BP now controlled. Hyperphosphatemia improved with phosphate binder  - continue sevelamer for hyperphosphatemia  - renally dose medications and avoid nephrotoxins  - Monitor Cr  - continue lasix 80mg  po daily   - Fluid restriction, daily standing weight    Anemia of chronic disease  Iron studies suggestive of anemia of chronic disease.  No evidence of acute blood loss.    Cough  Rhinorhea, resovled  Patient with rhinovirus, but no longer with significant secretions. Cough improved. Isoaltion d/c.   - robitussin DM PRN    DM2  Peripheral neuropathy  Retinopathy  A1c in march was 9.8, sugars remain consistently above 200's. Repeat A1c in May was 7.6%.   Gabapentin reduced to 100mg  TID given renal function. Lantus added 6/1  - continue lantus  8u QHS + SSI with meals    PE (saddle embolus) status post IVC filter  supratherapeutic INR  The PE was diagnosed 3 years ago at [facility] with IVC placed with plan for lifelong AC given unprovoked and severity.  Feb V/Q without evidence of PE. Was on coumadin 20mg  daily resulting in supratherapeutic INR on presentation here.  Now with therapeutic INR  - [facility] to follow  - warfarin 2mg  x 1 today    Constipation  - lactulose Q6H PRN (hold for loose stools)  - routine bowel regimen    Left foot fracture  Recent Charcot's neuropathic osteoarthropathy left foot versus  neuropathic fracture of the navicular bone and fifth metatarsal. Seen by ortho with worsening osteonecrosis on xray.  Patient must wear orthotics (CAM boot) boot when ambulating.   PT rec SNF  Patient will need follow up ortho outpt ~1wk following discharge.    FEN: renal/diabetic diet  DVT Prophylaxis: Coumadin  Code status: Full  Dispo: Medically stable for discharge back to SNF, [facility] without available beds    Erin Hearing, MD   Internal Medicine Attending  Hospitalist Division  Pager: [phone]  PI: [phone]

## 2015-11-07 NOTE — Allied Health Procedure (Signed)
Mobility Program Data Extraction Note  (See Physical Therapy Notes for Clinical Information)        Mobility Phase Guidelines: [email or web address]    Last Documented Mobility Phase: Phase 3    Current Phase: Phase 3    Mobility Session Initiated with Physical Therapy?: Yes    Mobility Session Completed with Physical Therapy?: Yes    Mobility Program Status: In Progress      ======================================================================       Phase I  Phase II    Command and physical response activation  Arousal/ orientation/ communication Degree of Interation: Low Cooperation    Command and verbal response activation     Patient and/or caregiver education    Arousal and orientation degree of Interaction: Comatose, Unarousable    Musculoskeletal Program: Advancement (AROM, AAROM, resistive training, metered exercise UE/LE    Musculoskeletal Program: Positioning Head to Feet: prevent subluxation, joint malalignment, manage tone, manage edema, visual-spatial orientation, PROM all limbs: proximal to distal, facilitate basic AROM  Participation in beginning components of bed mobility: Reaching, rolling, active LE      Sensorimotor Program: midline orientation, reflexes, tactile feedback  Positioning Head to Feet: prevent subluxation, joint malalignment, manage tone, manage edema, visual-spatial orientation    Caregiver education and participation as appropriate  Sensorimotor Program: visual attention, midline orientation, righting reactions    Dependent splint/orthotic application  Supported sitting EOB, cardiac chair, wheelchair    Dependent mobilization out of bed (to cardiac chair)  Mobilize out of bed: Passive Transfers Dependent through Max Assist  ?Lift Team indicated    Encourage patient participation with bed-level ADL's: Hygiene/grooming; self-feeding; upper body sponge bathing, upper body dressing  Mobilize out of bed:  Assess transfer type, level of assist, tolerance      Sitting schedule implemented all shifts      EOB: Supported, unsupported or challenged balance activities      Supported sitting exercise: metered exercise UE/LE      Pre-gait training (dependent to moderate assist)      Other mobilization: Tile Table Program      Passive Splint/Orthotic Application      Encourage patient participation with edge of bed ADL's: Hygiene/grooming; self-feeding; upper body sponge bathing/dressing; lower body sponge bathing    Phase III  Phase IV    Arousal/orientation/communication Degree of Interaction: Moderate Cooperation  Degree of interaction: High Cooperation Education for patient/family    Patient and/or cargiver education as appropriate (incorporate any appropriate activity)  Advanced Musculoskeletal Program: Resistive, metered exercise UE/LE    Musculoskeletal Program: Advancement (AROM, AAROM, resistive training metered exercise UE/LE, Object Manipulation)  Advanced bed mobility/incorporate nursing all shifts     Sensorimotor Program: proprioception feeback, coordination reactions  Sensorimotor Program: timing, skilled voluntary control of limbs, position ; direction change reactions    X Caregiver education and participation  Functional transfer training (commode or chair)/incorporate nursing all shifts    Bed mobility advancement  High level balance activities    Sitting balance advancement: Static and dynamic trunk activities  Gait program or (wheelchair mobility for wheelchair-dependent only), incorporate nursing all shifts   X Advance out of bed sitting tolerance  Active splint/orthotic application    Active assisted transfer training and advancement  Encourage patient participation in ADL's in bathroom setting: Use of regular toilet; ADL's at sink-side; consider use of shower stall    Standing Activities (Pre-gait): Supported/unsupported, Static/dynamic; tilt table     X Gait Training/assisted gait      Active  assistive splint/orthotic application       X Encourage Patient participation in seated ADL Activities OOB in bedside chair/wheelchair/ cardiac chair/ bedside commode: Hygiene/grooming; self-feeding; upper body sponge bathing/ dressing; lower Body sponge bathing/ dressing; toileting       Phase D for Pediatric use    ======================================================================    Electronically signed by:     Lourdes Kucharski  P.T.  P.I. # [id number]

## 2015-11-07 NOTE — Nurse Focus (Signed)
Patient c/o chest pain, reports "my chest is tight, it feels like someone is sitting on it". Patient c/o of this pain while resting in bed, vitals are stable. MD made aware, at bedside to assess. Will collect EKG.

## 2015-11-07 NOTE — Nurse Focus (Signed)
Patient's lidocaine patch to left flank came off while in the shower. Offered patient a new one but patient said "later".

## 2015-11-07 NOTE — Nurse Assessment (Signed)
ASSESSMENT NOTE    Note Started: 11/07/2015, 01:51     Initial assessment completed and recorded in EMR.  Report received from day shift nurse and orders reviewed. Plan of Care reviewed and appropriate, discussed with patient.  Doree BarthelEvelyn Correa, LVN Other (specify): LVN

## 2015-11-08 LAB — CBC WITH DIFFERENTIAL
BASOPHILS % AUTO: 0.7 %
BASOPHILS ABS AUTO: 0 10*3/uL (ref 0–0.2)
EOSINOPHIL % AUTO: 4.9 %
EOSINOPHIL ABS AUTO: 0.2 10*3/uL (ref 0–0.5)
HEMATOCRIT: 27.8 % — AB (ref 34–46)
HEMOGLOBIN: 8.6 g/dL — AB (ref 12.0–16.0)
LYMPHOCYTE ABS AUTO: 0.8 10*3/uL — AB (ref 1.0–4.8)
LYMPHOCYTES % AUTO: 21.7 %
MCH: 21.9 pg — AB (ref 27–33)
MCHC: 31 % — AB (ref 32–36)
MCV: 70.5 UM3 — AB (ref 80–100)
MONOCYTES % AUTO: 10.6 %
MONOCYTES ABS AUTO: 0.4 10*3/uL (ref 0.1–0.8)
MPV: 8.5 UM3 (ref 6.8–10.0)
NEUTROPHIL ABS AUTO: 2.3 10*3/uL (ref 1.80–7.70)
NEUTROPHILS % AUTO: 62.1 %
PLATELET COUNT: 235 10*3/uL (ref 130–400)
RDW: 19 U — AB (ref 0–14.7)
RED CELL COUNT: 3.94 10*6/uL (ref 3.7–5.5)
WHITE BLOOD CELL COUNT: 3.7 10*3/uL — AB (ref 4.5–11.0)

## 2015-11-08 LAB — MAGNESIUM (MG): MAGNESIUM (MG): 2 mg/dL (ref 1.5–2.6)

## 2015-11-08 LAB — BASIC METABOLIC PANEL
CALCIUM: 8.6 mg/dL (ref 8.6–10.5)
CARBON DIOXIDE TOTAL: 22 meq/L — AB (ref 24–32)
CHLORIDE: 108 meq/L (ref 95–110)
CREATININE BLOOD: 4.18 mg/dL — AB (ref 0.44–1.27)
E-GFR, AFRICAN AMERICAN: 14 SEE NOTE — AB (ref >60)
E-GFR, NON-AFRICAN AMERICAN: 12 — AB (ref >60)
GLUCOSE: 140 mg/dL — AB (ref 70–99)
POTASSIUM: 4.3 meq/L (ref 3.3–5.0)
SODIUM: 139 meq/L (ref 135–145)
UREA NITROGEN, BLOOD (BUN): 67 mg/dL — AB (ref 8–22)

## 2015-11-08 LAB — INR: INR: 2.76 — AB (ref 0.87–1.18)

## 2015-11-08 LAB — PHOSPHORUS (PO4): PHOSPHORUS (PO4): 5 mg/dL (ref 2.4–5.0)

## 2015-11-08 MED ORDER — WARFARIN 2 MG TABLET
5.0000 mg | ORAL_TABLET | ORAL | Status: AC
Start: 2015-11-08 — End: 2015-11-08
  Administered 2015-11-08: 5 mg via ORAL
  Filled 2015-11-08: qty 1

## 2015-11-08 NOTE — Nurse Assessment (Signed)
ASSESSMENT NOTE          Initial assessment completed and recorded in EMR.  Report received from day shift nurse and orders reviewed. Plan of Care reviewed and appropriate, discussed with patient. Patient complained of nausea.Will administer prn med for nausea. Patient's goal for this shift is nausea control. Will keep patient safe and comfortable. Will continue to monitor. Marshell GarfinkelLoradyl Halla Chopp,  RN.

## 2015-11-08 NOTE — Progress Notes (Signed)
INTERNAL MEDICINE DAILY PROGRESS NOTE  Date: 11/08/2015  Time:  17:21      Leslie Hernandez is a 50yo F with DM2, CKD4, PE on coumadin, dCHF, and HTN who presented 5/20 for progressive right eye vision loss 2/2 vitreal hemorrhage about to undergo vitrectomy but found to have supratherapeutic INR in PACU so sent to ED where she was found to have dyspnea, cough, rhinorrhea, nausea, and vomiting with an INR 4.87. She was found to have a Rhinovirus URI as well as decompensated CHF and subsequently admitted for further medical management.     INTERVAL HISTORY:    - INR 2.76 today. Warfarin 5mg  x 1 today  - No acute events overnight    SUBJECTIVE:   More alert and interactive today. Reports pain in eye is still a "7/10" but improving. Wondering if she can go back to her room and board instead of SNF.     MEDICATIONS:  Scheduled Medications  Acetaminophen (TYLENOL) Tablet 1,000 mg, ORAL, Q8H  Amlodipine (NORVASC) Tablet 10 mg, ORAL, QAM  Atorvastatin (LIPITOR) Tablet 80 mg, ORAL, Daily Bedtime  Brimonidine (ALPHAGAN) 0.2 % Ophthalmic Solution 1 drop, BOTH Eyes, TID  Calcium Acetate (PHOSLO) Capsule 1,334 mg, ORAL, TID w/ meals  Carvedilol (COREG) Tablet 12.5 mg, ORAL, BID w/ meals  Docusate (COLACE) Capsule 200 mg, ORAL, BID  FUROsemide (LASIX) Tablet 80 mg, ORAL, QAM  Gabapentin (NEURONTIN) Capsule 100 mg, ORAL, TID  HydrALAZINE (APRESOLINE) Tablet 100 mg, ORAL, TID  Insulin Aspart (NOVOLOG) Injection Pen 1-4 Units, SUBCUTANEOUS, Daily Bedtime  Insulin Aspart (NOVOLOG) Injection Pen 1-6 Units, SUBCUTANEOUS, TID w/ meals - correction dose  Insulin Glargine (LANTUS) Injection 8 Units, SUBCUTANEOUS, Daily Bedtime  Lactulose (CHRONULAC) 10 gram/15 mL Solution 30 mL, ORAL, QID  Lidocaine (LIDODERM) 5 %(700 mg/patch) Patch 2 patch, Transdermal, Q24H Now  Lidocaine patch REMOVAL 1 patch, Transdermal, Q24H Now  Neomycin/Polymyxin/Dexamethasone (MAXITROL) Ophthalmic Ointment 0.5 inch, RIGHT Eye, Q12H Now  Polyethylene Glycol 3350 (MIRALAX)  Oral Powder Packet 17 g, ORAL, QAM  Sennosides (SENOKOT) Tablet 17.2 mg, ORAL, Daily Bedtime  Warfarin Patient Flag, NOT APPLICABLE, PATIENT FLAG ONLY    IV Medications   PRN Medications  Albuterol (PROAIR HFA, PROVENTIL HFA, VENTOLIN HFA) Inhaler 1-2 puff, INHALATION, Q4H PRN  Bisacodyl (DULCOLAX) Suppository 10 mg, RECTALLY, Q24H PRN  Camphor/Menthol (SARNA) Lotion, TOPICAL, BID prn  Dextrose 50% Injection 12.5 g, IV, PRN  Dextrose 50% Injection 25 g, IV, PRN  Glucagon Injection 1 mg, IM, PRN  Guaifenesin 100mg -Dextromethorphan 10mg /10ml (ROBITUSSIN DM) Syrup 10 mL, ORAL, Q4H PRN  HydrALAZINE (APRESOLINE) Injection 10 mg, IV, Q4H PRN  Hydromorphone (DILAUDID) Tablet 2 mg, ORAL, Q4H PRN  Mineral Oil (FLEET) Enema 1 enema, RECTALLY, Bedtime PRN  Ondansetron (ZOFRAN) Injection 4 mg, IV, Q8H PRN  Promethazine (PHENERGAN) Injection 12.5 mg, IV, Q6H PRN      OBJECTIVE:   Vital Signs  Summary  Temp Min: 36.7 C (98 F) Max: 37 C (98.6 F)  BP: (130-152)/(80-92)   Pulse Min: 67 Max: 80  Resp Min: 18 Max: 18  SpO2 Min: 98 % Max: 100 %      Current Vitals  Temp: 36.7 C (98 F)  BP: 152/83  Pulse: 80  Resp: 18  SpO2: 98 %      Weight: 106.9 kg (235 lb 10.8 oz)     Intake and Output  Last Two Completed Shifts  In: 650 [Oral:650]  Out: 1800 [Urine:1800]    Current Shift  In: 480 [Oral:480]  Out:  25 [Emesis:25]    Physical Exam  General: obese female, No acute distress, appears sleepy but arousable  HEENT:  Moist oral mucosa.  R eye chronic blindness. Left pupil reactive to light.   CV: Regular rate and rhythm, nl s1s2, no murmurs, rubs or gallops. No JVD.  Pulm: CTA anteriorly with some bibasilar crackles.   Abd: Soft NT ND, +BS. No rebound or guarding  Skin: No rashes. No jaundice.  Neuro: face symmetric, moving all 4 extremities spontaneously.    Extrem: trace LE ankle edema b/l.    LINES AND DRAINS:   piv    LAB TESTS/STUDIES:   I personally reviewed the following report(s)  .  Lab Results - 24 hours (excluding  micro and POC)   BASIC METABOLIC PANEL     Status: Abnormal   Result Value Status    SODIUM 139 Final    POTASSIUM 4.3 Final    CHLORIDE 108 Final    CARBON DIOXIDE TOTAL 22 (L) Final    UREA NITROGEN, BLOOD (BUN) 67 (H) Final    CREATININE BLOOD 4.18 (H) Final    E-GFR, AFRICAN AMERICAN 14 (L) Final    E-GFR, NON-AFRICAN AMERICAN 12 (L) Final    GLUCOSE 140 (H) Final    CALCIUM 8.6 Final   MAGNESIUM (MG)     Status: None   Result Value Status    MAGNESIUM (MG) 2.0 Final   PHOSPHORUS (PO4)     Status: None   Result Value Status    PHOSPHORUS (PO4) 5.0 Final   INR     Status: Abnormal   Result Value Status    INR 2.76 (H) Final   CBC WITH DIFFERENTIAL     Status: Abnormal   Result Value Status    WHITE BLOOD CELL COUNT 3.7 (L) Final    RED CELL COUNT 3.94 Final    HEMOGLOBIN 8.6 (L) Final    HEMATOCRIT 27.8 (L) Final    MCV 70.5 (L) Final    MCH 21.9 (L) Final    MCHC 31.0 (L) Final    RDW 19.0 (H) Final    MPV 8.5 Final    PLATELET COUNT 235 Final    NEUTROPHILS % AUTO 62.1 Final    LYMPHOCYTES % AUTO 21.7 Final    MONOCYTES % AUTO 10.6 Final    EOSINOPHIL % AUTO 4.9 Final    BASOPHILS % AUTO 0.7 Final    NEUTROPHIL ABS AUTO 2.30 Final    LYMPHOCYTE ABS AUTO 0.8 (L) Final    MONOCYTES ABS AUTO 0.4 Final    EOSINOPHIL ABS AUTO 0.2 Final    BASOPHILS ABS AUTO 0 Final     INFLUENZA A Negative  Negative  Final     INFLUENZA B Negative  Negative  Final    RESPIRATORY SYNCYTIAL VIRUS Negative  Negative  Final    PARAINFLUENZA Negative  Negative  Final    CORONAVIRUS Negative  Negative  Final    HUMAN METAPNEUMOVIRUS Negative  Negative  Final    RHINOVIRUS/ENTEROVIRUS POSITIVE (Abnl) Negative  Final    ADENOVIRUS Negative  Negative  Final    HUMAN BOCAVIRUS Negative  Negative  Final    CHLAMYDOPHILA PNEUMONIAE Negative  Negative  Final    MYCOPLASMA PNEUMONIAE Negative  Negative  Final    Comment:       Xray left foot:  Flattening and fragmentation of the navicular bone, compatible with  subacute fracture.  Regions of sclerosis within the navicular bone likely  reflect osteonecrosis.  Old healed fracture of the proximal fifth metatarsal.    CXR 11/06/15  IMPRESSION:  STABLE CARDIOMEGALY. CONSIDER PERICARDIAL EFFUSION.  MILD INCREASE IN INTERSTITIAL MARKINGS, ESPECIALLY IN THE LEFT PERIHILAR  REGION AND LUNG BASE. THE FINDINGS ARE COMPATIBLE WITH PULMONARY EDEMA BUT A BILATERAL INFECTIOUS PROCESS COULD HAVE A SIMILAR APPEARANCE    ASSESSMENT & PLAN:    50 year old female past medical history of DM-2, CKD-4, PE on warfarin, dCHF, HTN with CHF exacerbation and subacute vitreal hemorrhage requiring ophtho retinal surgery which occurred but delayed due to supratherapeutic INR when admitted.    Glaucoma  Vitreal hemorrhage 2/2 supratherapeutic INR  Diabetic retinopathy  Right eye blindness  Transferred to [facility] for urgent retinal surgery for elevated IOP but procedure delayed due to supratherapeutic INR and vitrious hemorrage. S/p vitrectomy and laser photocoagulation by [facility] on 5/25.   - continue brimonidine TID to both eyes, maxitrol tapered to R eye BID x 2 more weeks  - F/u ophthalmology in 3 weeks ([facility] vs [facility])  - continue po dilaudid  Q4H PRN for pain    HTN  Persistently hypertensive. Already on hydralazine  TID, amlodipine  daily, and lasix  daily.   - Started on coreg in-house with improving BP control. Continue coreg 12.5mg  BID    AKI on CKD  Acute on chronic heart failure, diastolic  Baseline Cre 2.3, presented with Cr 3.5 which rose with diuresis. Was given IVF with minimal improvement. Has symmetric 1-2+ pitting edema of lower extremities. BNP improved from 5/20, thus unlikely 2/2 cardiorenal. BP now controlled. Hyperphosphatemia improved with phosphate binder  - continue sevelamer for hyperphosphatemia  - renally dose medications and avoid nephrotoxins  - Monitor Cr  - continue lasix  po daily   - Fluid restriction, daily standing weight    Anemia of chronic disease  Iron studies  suggestive of anemia of chronic disease.  No evidence of acute blood loss.    Cough  Rhinorhea, resovled  Patient with rhinovirus, but no longer with significant secretions. Cough improved. Isoaltion d/c.   - robitussin DM PRN    DM2  Peripheral neuropathy  Retinopathy  A1c in march was 9.8, sugars remain consistently above 200's. Repeat A1c in May was 7.6%.   Gabapentin reduced to  TID given renal function. Lantus added 6/1  - continue lantus 8u QHS + SSI with meals    PE (saddle embolus) status post IVC filter  supratherapeutic INR  The PE was diagnosed 3 years ago at [facility] with IVC placed with plan for lifelong AC given unprovoked and severity.  Feb V/Q without evidence of PE. Was on coumadin  daily resulting in supratherapeutic INR on presentation here.  Now with therapeutic INR  - [facility] pharmacy to follow  - warfarin  x 1 today    Constipation  - lactulose Q6H PRN (hold for loose stools)  - routine bowel regimen    Left foot fracture  Recent Charcot's neuropathic osteoarthropathy left foot versus neuropathic fracture of the navicular bone and fifth metatarsal. Seen by ortho with worsening osteonecrosis on xray.  Patient must wear orthotics (CAM boot) boot when ambulating.   PT rec SNF  Patient will need follow up ortho outpt ~1wk following discharge.    FEN: renal/diabetic diet  DVT Prophylaxis: Coumadin  Code status: Full  Dispo: Medically stable for discharge back to SNF, [facility] without available beds    Erin Hearing, MD   Internal Medicine Attending  Hospitalist  Division  Pager: [place]: [phone]

## 2015-11-08 NOTE — Allied Health Consult (Addendum)
PM&R -- ACUTE CARE SERVICE  OCCPATIONAL THERAPY  INTERIM REPORT    Name: Leslie Hernandez   MRN: [id number]   Date of Report: 11/08/2015     Initial Treatment Date:  10/22/2015    Date of Admission: 10/20/2015  Date of Onset (Medicare only):  10/20/2015    Diagnosis:  SOB            Precautions:  Fall precautions  Left Leg With brace only must wear CAM boot     Primary Service:  (A) Hospital Medicine Faculty Service     Additional Orders:  ACTIVE OT ORDERS     Updated Present Condition (New Dx, Change in Status, New Procedures):  PER MD note: [name] is a 50yo F with DM2, CKD4, PE on coumadin, dCHF, and HTN who presented 5/20 for progressive right eye vision loss 2/2 vitreal hemorrhage about to undergo vitrectomy but found to have supratherapeutic INR in PACU so sent to ED where she was found to have dyspnea, cough, rhinorrhea, nausea, and vomiting with an INR 4.87. She was found to have a Rhinovirus URI as well as decompensated CHF and subsequently admitted for further medical management.     INTERVAL HISTORY:    - INR 2.76 today. Warfarin 5mg  x 1 today  - No acute events overnight    SUBJECTIVE:   More alert and interactive today. Reports pain in eye is still a "7/10" but improving. Wondering if she can go back to her room and board instead of SNF.     Functional Update:   Key: Dep=Dependent     Max=Maximal     Mod=Moderate     Min=Minimal     CG=Contact Guard     SB=Stand-By     S=Supervised     I=Independent     NA=Not Applicable     NT=Not Tested     N=Normal     G=Good     F=Fair     P=Poor     U=Unable     FWW=Front-Wheeled Walker      AC=Axillary Crutches     SPC=Single-Point Cane       Level of Assistance: Dep Max Mod Min SBA  S MI I NA NT Comments   Bed Mobility (roll/self-adjust)       x       Sidelying To Sit / Supine To Sit       x       Transfer Bed To Chair    x        sit ? stand      Transfer Bed To Toilet    x       Bed side commode    Transfer To Tub / Shower          x    Grooming / Hygiene       x        Toileting      x     Bed side commode    Bathing Upper          x    Bathing Lower          x    Upper Body Dress       x       Lower Body Dress   x           Self Feeding       x  Comments:       Cognitive / Perceptual:  Alert and oriented  X 4, very labile     Updated Goals:    Hygiene/Grooming: Independent at Manual wheeled chair level   Dressing: Upper Body  Independent seated   Dressing:  Lower Body  Mod I with long handled adaptive device prn   Functional transfer to toilet Mod I with good compliance with mWb precaution   Functional transfer to shower. Mod I / Supervision with front wheeled walker to shower chair   Functional transfer to w/c: Independent via stand pivot with front wheeled walker                    Updated Treatment Plan:    Bed mobility Training, Transfer Training, Sitting Balance, Functional Mobility, ADL / Self Care, Therapeutic Exercise, Adaptive Equipment, Durable Medical Equipment, Wheelchair Mobility, Energy Conservation / Work Simplification, Patient / Sales promotion account executive and Home Exercise Program      Next Interim Report Due: 11/22/2015    Reported by:   Arma Heading, OTR/L II,    Dept. of Physical Medicine & Rehabilitation (Acute Care Services)   Occupational Therapies  PI #: [id number]  Vocera: [phone]  Phone: [phone]

## 2015-11-08 NOTE — Allied Health Progress (Addendum)
Name: Lora PaulaDorothy Mccall MRN: [id number]    Date of Service:   11/08/2015      Clinical Case Management Progress Note    Comments:     [facility], no Case Manager assigned to pt until afternoon. Jan [phone]  is today's case manager, tentatively approves placement at [facility].  [facility] willing to give pt next available bed, none today.    [facility]    Further follow-up needed:     Continue to coordinate placement efforts.      Date: 11/08/2015  Time: 15:40    Electronically Signed by:  Basil Dessamille Mikko Lewellen, Case Manager, LCSW   Pager: [phone]

## 2015-11-08 NOTE — Plan of Care (Signed)
Problem: Patient Care Overview (Adult)  Goal: Plan of Care Review  Outcome: Ongoing (interventions implemented as appropriate)  Goal Outcome Evaluation Note     Leslie Hernandez is a [age]yr female admitted 10/20/2015      OUTCOME SUMMARY AND PLAN MOVING FORWARD:   No acute changes observed today, ambulated with FWW and cam boot from bed to hallway and back, sat up in chair for 3.5 hours, requiring a stand-by assist. One episode of emesis while working with OT, PRN IV Zofran provided to good effect. Pending placement.       Goal: Individualization and Mutuality  Outcome: Ongoing (interventions implemented as appropriate)  Goal: Discharge Needs Assessment  Outcome: Ongoing (interventions implemented as appropriate)    Problem: Sleep Pattern Disturbance (Adult)  Goal: Adequate Sleep/Rest  Patient will demonstrate the desired outcomes by discharge/transition of care.   Outcome: Ongoing (interventions implemented as appropriate)    Problem: Fall Risk (Adult)  Goal: Absence of Falls  Patient will demonstrate the desired outcomes by discharge/transition of care.   Outcome: Ongoing (interventions implemented as appropriate)    Problem: Mobility, Physical Impaired (Adult)  Goal: Enhanced Mobility Skills  Patient will demonstrate the desired outcomes by discharge/transition of care.   Outcome: Ongoing (interventions implemented as appropriate)  Goal: Enhanced Functionality Ability  Patient will demonstrate the desired outcomes by discharge/transition of care.   Outcome: Ongoing (interventions implemented as appropriate)    Problem: Fluid Volume Excess (Adult,Obstetrics,Pediatric)  Goal: Stable Weight  Patient will demonstrate the desired outcomes by discharge/transition of care.   Outcome: Ongoing (interventions implemented as appropriate)  Goal: Balanced Intake/Output  Patient will demonstrate the desired outcomes by discharge/transition of care.   Outcome: Ongoing (interventions implemented as appropriate)    Problem: Pain, Acute  (Adult)  Goal: Acceptable Pain Control/Comfort Level  Patient will demonstrate the desired outcomes by discharge/transition of care.   Outcome: Ongoing (interventions implemented as appropriate)    Problem: Pressure Ulcer Risk (Braden Scale) (Adult,Obstetrics,Pediatric)  Goal: Skin Integrity  Patient will demonstrate the desired outcomes by discharge/transition of care.   Outcome: Ongoing (interventions implemented as appropriate)

## 2015-11-08 NOTE — Plan of Care (Signed)
Problem: Patient Care Overview (Adult)  Goal: Plan of Care Review  Outcome: Ongoing (interventions implemented as appropriate)    11/08/15 0239   OTHER   Plan Of Care Reviewed With patient   Plan of Care Review   Progress improving   Goal Outcome Evaluation Note     Leslie Hernandez is a [age]yr female admitted 10/20/2015      OUTCOME SUMMARY AND PLAN MOVING FORWARD:   Pt is sleeping well vss, she asked for pain med x1, she has been sleeping well, she was easy to arouse when I gave her tylenol, no acute changes noted Doree BarthelEvelyn Correa, LVN        Problem: Sleep Pattern Disturbance (Adult)  Goal: Adequate Sleep/Rest  Patient will demonstrate the desired outcomes by discharge/transition of care.   Outcome: Ongoing (interventions implemented as appropriate)    Problem: Fall Risk (Adult)  Goal: Absence of Falls  Patient will demonstrate the desired outcomes by discharge/transition of care.   Outcome: Ongoing (interventions implemented as appropriate)    Problem: Mobility, Physical Impaired (Adult)  Goal: Enhanced Mobility Skills  Patient will demonstrate the desired outcomes by discharge/transition of care.   Outcome: Ongoing (interventions implemented as appropriate)  Goal: Enhanced Functionality Ability  Patient will demonstrate the desired outcomes by discharge/transition of care.   Outcome: Ongoing (interventions implemented as appropriate)    Problem: Fluid Volume Excess (Adult,Obstetrics,Pediatric)  Goal: Stable Weight  Patient will demonstrate the desired outcomes by discharge/transition of care.   Outcome: Ongoing (interventions implemented as appropriate)  Goal: Balanced Intake/Output  Patient will demonstrate the desired outcomes by discharge/transition of care.   Outcome: Ongoing (interventions implemented as appropriate)    Problem: Pain, Acute (Adult)  Goal: Acceptable Pain Control/Comfort Level  Patient will demonstrate the desired outcomes by discharge/transition of care.   Outcome: Ongoing (interventions  implemented as appropriate)    Problem: Pressure Ulcer Risk (Braden Scale) (Adult,Obstetrics,Pediatric)  Goal: Skin Integrity  Patient will demonstrate the desired outcomes by discharge/transition of care.   Outcome: Ongoing (interventions implemented as appropriate)

## 2015-11-08 NOTE — Nurse Assessment (Signed)
ASSESSMENT NOTE    Note Started: 11/08/2015, 07:48     Initial assessment completed and recorded in EMR.  Report received from night shift nurse and orders reviewed. Plan of Care reviewed and appropriate, discussed with patient.  Charlett Langoanh Anuhea Gassner, RN

## 2015-11-09 LAB — BASIC METABOLIC PANEL
CALCIUM: 8.9 mg/dL (ref 8.6–10.5)
CARBON DIOXIDE TOTAL: 23 meq/L — AB (ref 24–32)
CHLORIDE: 105 meq/L (ref 95–110)
CREATININE BLOOD: 4.18 mg/dL — AB (ref 0.44–1.27)
E-GFR, AFRICAN AMERICAN: 14 SEE NOTE — AB (ref >60)
E-GFR, NON-AFRICAN AMERICAN: 12 — AB (ref >60)
GLUCOSE: 162 mg/dL — AB (ref 70–99)
POTASSIUM: 4.8 meq/L (ref 3.3–5.0)
SODIUM: 137 meq/L (ref 135–145)
UREA NITROGEN, BLOOD (BUN): 65 mg/dL — AB (ref 8–22)

## 2015-11-09 LAB — CBC WITH DIFFERENTIAL
BASOPHILS % AUTO: 1.4 %
BASOPHILS ABS AUTO: 0.1 10*3/uL (ref 0–0.2)
EOSINOPHIL % AUTO: 5.2 %
EOSINOPHIL ABS AUTO: 0.2 10*3/uL (ref 0–0.5)
HEMATOCRIT: 30.6 % — AB (ref 34–46)
HEMOGLOBIN: 9.4 g/dL — AB (ref 12.0–16.0)
LYMPHOCYTE ABS AUTO: 1.1 10*3/uL (ref 1.0–4.8)
LYMPHOCYTES % AUTO: 31.4 %
MCH: 21.7 pg — AB (ref 27–33)
MCHC: 30.8 % — AB (ref 32–36)
MCV: 70.3 UM3 — AB (ref 80–100)
MONOCYTES % AUTO: 8.7 %
MONOCYTES ABS AUTO: 0.3 10*3/uL (ref 0.1–0.8)
MPV: 7.4 UM3 (ref 6.8–10.0)
NEUTROPHIL ABS AUTO: 1.9 10*3/uL (ref 1.80–7.70)
NEUTROPHILS % AUTO: 53.3 %
PLATELET COUNT: 298 10*3/uL (ref 130–400)
RDW: 19.4 U — AB (ref 0–14.7)
RED CELL COUNT: 4.35 10*6/uL (ref 3.7–5.5)
WHITE BLOOD CELL COUNT: 3.6 10*3/uL — AB (ref 4.5–11.0)

## 2015-11-09 LAB — INR: INR: 2.35 — AB (ref 0.87–1.18)

## 2015-11-09 LAB — CULTURE URINE, BACTI

## 2015-11-09 LAB — ELECTROCARDIOGRAM WITH RHYTHM STRIP: QTC: 430

## 2015-11-09 LAB — MAGNESIUM (MG): MAGNESIUM (MG): 2.1 mg/dL (ref 1.5–2.6)

## 2015-11-09 LAB — PHOSPHORUS (PO4): PHOSPHORUS (PO4): 5 mg/dL (ref 2.4–5.0)

## 2015-11-09 MED ORDER — CARVEDILOL 25 MG TABLET
25.0000 mg | ORAL_TABLET | Freq: Two times a day (BID) | ORAL | Status: DC
Start: 2015-11-09 — End: 2015-11-18
  Administered 2015-11-09 – 2015-11-18 (×18): 25 mg via ORAL
  Filled 2015-11-09 (×18): qty 1

## 2015-11-09 MED ORDER — WARFARIN 2 MG TABLET
6.0000 mg | ORAL_TABLET | ORAL | Status: AC
Start: 2015-11-09 — End: 2015-11-09
  Administered 2015-11-09: 6 mg via ORAL
  Filled 2015-11-09: qty 3

## 2015-11-09 NOTE — Plan of Care (Addendum)
Problem: Patient Care Overview (Adult)  Goal: Plan of Care Review  Outcome: Ongoing (interventions implemented as appropriate)  Goal Outcome Evaluation Note     Leslie Hernandez is a [age]yr female admitted 10/20/2015      OUTCOME SUMMARY AND PLAN MOVING FORWARD:      VSS. NAC. Large, formed BM. Pain well controlled on current regimen. Good appetite. BS well controlled. Fluid restriction maintained. Awaiting placement, will continue to monitor.          Goal: Individualization and Mutuality  Outcome: Ongoing (interventions implemented as appropriate)  Goal: Discharge Needs Assessment  Outcome: Ongoing (interventions implemented as appropriate)    Problem: Sleep Pattern Disturbance (Adult)  Goal: Adequate Sleep/Rest  Patient will demonstrate the desired outcomes by discharge/transition of care.   Outcome: Ongoing (interventions implemented as appropriate)    Problem: Fall Risk (Adult)  Goal: Absence of Falls  Patient will demonstrate the desired outcomes by discharge/transition of care.   Outcome: Ongoing (interventions implemented as appropriate)    Problem: Mobility, Physical Impaired (Adult)  Goal: Enhanced Mobility Skills  Patient will demonstrate the desired outcomes by discharge/transition of care.   Outcome: Ongoing (interventions implemented as appropriate)  Goal: Enhanced Functionality Ability  Patient will demonstrate the desired outcomes by discharge/transition of care.   Outcome: Ongoing (interventions implemented as appropriate)    Problem: Fluid Volume Excess (Adult,Obstetrics,Pediatric)  Goal: Stable Weight  Patient will demonstrate the desired outcomes by discharge/transition of care.   Outcome: Ongoing (interventions implemented as appropriate)  Goal: Balanced Intake/Output  Patient will demonstrate the desired outcomes by discharge/transition of care.   Outcome: Ongoing (interventions implemented as appropriate)    Problem: Pain, Acute (Adult)  Goal: Acceptable Pain Control/Comfort Level  Patient will  demonstrate the desired outcomes by discharge/transition of care.   Outcome: Ongoing (interventions implemented as appropriate)    Problem: Pressure Ulcer Risk (Braden Scale) (Adult,Obstetrics,Pediatric)  Goal: Skin Integrity  Patient will demonstrate the desired outcomes by discharge/transition of care.   Outcome: Ongoing (interventions implemented as appropriate)

## 2015-11-09 NOTE — Op Note (Signed)
Preop diagnosis: nonclearing vitreous hemorrhage with proliferative diabetic retinopathy and ghose cell glaucoma right eye      Postop diagnosis: same     Procedure:25g pars planar vitrectomy, endolaser panretinal laser photocoagulation right eye       Surgeon: Carlissa Pesola, MD PhD  Assistant: S Wong, MD    Anesthesia: MAC with retrobulbar block    Indication for surgery: For possible visual improvement and to minimize risk of further vision loss and for glaucoma control    Summary of procedure:    After obtaining written consent and reviewing the risk and benefits of surgery, including the risk of retinal detachment, bleeding, infection, loss of vision, loss of eye, etc, the patient's right eye was dilated preoperatively using 1% cyclogyl, 1% mydriacyl and 2.5% phenylephrine. The patient was brought to the OR where after confirming the site and procedure to be done, a retrobulbar block of 2% lidocaine and 0.75% marcaine was administered under intravenous sedation. The operated eye was prepped and drapped in the usual sterile manner. A lid speculum was placed and a 25g trocar was inserted 3.5mm behind the infratemporal limbus. The infusion line was inserted and turned on to form the globe. Two 25g trocars was inserted supranasally and supratemporally, 3.5mm behind the limbus.    Pars planar vitrectomy was performed to remove the cortical vitreous which contained blood. Once complete vitrectomy was completed, active aspiration was performed to confirm a complete removal of vitreous hemorrhage. The retina was noted to be attached throughout with scars from prior panretinal laser photocoagulation. Detailed exam of the peripheral retina did not show any new retinal breaks or detachment. Endolaser was applied to the peripheral retina 360 degrees as supplemental panretinal laser photocoagulation . A total of 1237 spots were applied.      The three trochars were removed and the two superior sclerotomies were found  to be leaking. A limited peritomy was created and the superior sclerotomies were closed with 7-0 vicryl. Overlying peritomy was closed with 7-0 vicyrl.  Dexamethasone and kezol was injected subconjunctival. Intraocular pressure was normal tactile.     Maxitrol and atropine oinments were applied. Lid speculum and drapes were removed and the eye was covered with a dressing and shield.     The patient was sent to the recovery room in stable condition. There were no complications    I was present for the entire procedure.    The information contained on this form is true and accurate to the best of my knowledge.  Further, I understand that if I misrepresent, falsify or conceal information regarding my participation in the professional service described above, I may be subject to fine, imprisonment, or civil penalty under applicable federal laws.    Aileena Iglesia S. Marai Teehan, MD PhD  Vitreo-retinal Specialist  Professor  Attending

## 2015-11-09 NOTE — Allied Health Procedure (Signed)
Mobility Program Data Extraction Note  (See Physical Therapy Notes for Clinical Information)        Mobility Phase Guidelines: [email or web address]    Last Documented Mobility Phase: Phase 3    Current Phase: Phase 3    Mobility Session Initiated with Physical Therapy?: Yes    Mobility Session Completed with Physical Therapy?: No - patient refusal     Mobility Program Status: In Progress      ======================================================================        Phase I  Phase II    Command and physical response activation  Arousal/ orientation/ communication Degree of Interation: Low Cooperation    Command and verbal response activation     Patient and/or caregiver education    Arousal and orientation degree of Interaction: Comatose, Unarousable    Musculoskeletal Program: Advancement (AROM, AAROM, resistive training, metered exercise UE/LE    Musculoskeletal Program: Positioning Head to Feet: prevent subluxation, joint malalignment, manage tone, manage edema, visual-spatial orientation, PROM all limbs: proximal to distal, facilitate basic AROM  Participation in beginning components of bed mobility: Reaching, rolling, active LE      Sensorimotor Program: midline orientation, reflexes, tactile feedback  Positioning Head to Feet: prevent subluxation, joint malalignment, manage tone, manage edema, visual-spatial orientation    Caregiver education and participation as appropriate  Sensorimotor Program: visual attention, midline orientation, righting reactions    Dependent splint/orthotic application  Supported sitting EOB, cardiac chair, wheelchair    Dependent mobilization out of bed (to cardiac chair)  Mobilize out of bed: Passive Transfers Dependent through Max Assist  ?Lift Team indicated    Encourage patient participation with bed-level ADL's: Hygiene/grooming; self-feeding; upper body sponge bathing, upper body dressing   Mobilize out of bed: Assess transfer type, level of assist, tolerance      Sitting schedule implemented all shifts      EOB: Supported, unsupported or challenged balance activities      Supported sitting exercise: metered exercise UE/LE      Pre-gait training (dependent to moderate assist)      Other mobilization: Tile Table Program      Passive Splint/Orthotic Application      Encourage patient participation with edge of bed ADL's: Hygiene/grooming; self-feeding; upper body sponge bathing/dressing; lower body sponge bathing    Phase III  Phase IV    Arousal/orientation/communication Degree of Interaction: Moderate Cooperation  Degree of interactionPsychologist, forensic: High Cooperation Education for patient/family    Patient and/or cargiver education as appropriate (incorporate any appropriate activity)  Advanced Musculoskeletal Program: Resistive, metered exercise UE/LE    Musculoskeletal Program: Advancement (AROM, AAROM, resistive training metered exercise UE/LE, Object Manipulation)  Advanced bed mobility/incorporate nursing all shifts     Sensorimotor Program: proprioception feeback, coordination reactions  Sensorimotor Program: timing, skilled voluntary control of limbs, position ; direction change reactions     Caregiver education and participation  Functional transfer training (commode or chair)/incorporate nursing all shifts    Bed mobility advancement  High level balance activities    Sitting balance advancement: Static and dynamic trunk activities  Gait program or (wheelchair mobility for wheelchair-dependent only), incorporate nursing all shifts    Advance out of bed sitting tolerance  Active splint/orthotic application    Active assisted transfer training and advancement  Encourage patient participation in ADL's in bathroom setting: Use of regular toilet; ADL's at sink-side; consider use of shower stall    Standing Activities (Pre-gait): Supported/unsupported, Static/dynamic; tilt table      Gait Training/assisted gait  Active assistive splint/orthotic application      Encourage Patient participation in seated ADL Activities OOB in bedside chair/wheelchair/ cardiac chair/ bedside commode: Hygiene/grooming; self-feeding; upper body sponge bathing/ dressing; lower Body sponge bathing/ dressing; toileting       Phase D for Pediatric use    ======================================================================    Electronically signed by:     Ancil Dewan PTA  PI# [id number]

## 2015-11-09 NOTE — Progress Notes (Signed)
INTERNAL MEDICINE DAILY PROGRESS NOTE  Date: 11/09/2015  Time:  15:33      Leslie Hernandez is a 50yo F with DM2, CKD4, PE on coumadin, dCHF, and HTN who presented 5/20 for progressive right eye vision loss 2/2 vitreal hemorrhage about to undergo vitrectomy but found to have supratherapeutic INR in PACU so sent to ED where she was found to have dyspnea, cough, rhinorrhea, nausea, and vomiting with an INR 4.87. She was found to have a Rhinovirus URI as well as decompensated CHF and subsequently admitted for further medical management.     INTERVAL HISTORY:    - INR 2.35 today. Warfarin  x 1 ordered  - No acute events overnight     SUBJECTIVE:   More alert and interactive today. Now says she thinks she does need rehab and is not fit for room and board. Expressed sadness and the feeling of depression due to the death of her daughter in 2017-02-15from a massive brain aneurysm. Said she was an only child and her daughter was an only child, and she has no family left to care for her.  Expressed concern with how she would be able to manage on her own, with her R eye blindness. Also expressed that she was unhappy with [facility] and that they didn't take her eye complaints seriously and ignored it, and she holds them responsible for her eye blindness.     MEDICATIONS:  Scheduled Medications  Acetaminophen (TYLENOL) Tablet 1,000 mg, ORAL, Q8H  Amlodipine (NORVASC) Tablet 10 mg, ORAL, QAM  Atorvastatin (LIPITOR) Tablet 80 mg, ORAL, Daily Bedtime  Brimonidine (ALPHAGAN) 0.2 % Ophthalmic Solution 1 drop, BOTH Eyes, TID  Calcium Acetate (PHOSLO) Capsule 1,334 mg, ORAL, TID w/ meals  Carvedilol (COREG) Tablet 12.5 mg, ORAL, BID w/ meals  Docusate (COLACE) Capsule 200 mg, ORAL, BID  FUROsemide (LASIX) Tablet 80 mg, ORAL, QAM  Gabapentin (NEURONTIN) Capsule 100 mg, ORAL, TID  HydrALAZINE (APRESOLINE) Tablet 100 mg, ORAL, TID  Insulin Aspart (NOVOLOG) Injection Pen 1-4 Units, SUBCUTANEOUS, Daily Bedtime  Insulin Aspart (NOVOLOG) Injection  Pen 1-6 Units, SUBCUTANEOUS, TID w/ meals - correction dose  Insulin Glargine (LANTUS) Injection 8 Units, SUBCUTANEOUS, Daily Bedtime  Lactulose (CHRONULAC) 10 gram/15 mL Solution 30 mL, ORAL, QID  Lidocaine (LIDODERM) 5 %(700 mg/patch) Patch 2 patch, Transdermal, Q24H Now  Lidocaine patch REMOVAL 1 patch, Transdermal, Q24H Now  Neomycin/Polymyxin/Dexamethasone (MAXITROL) Ophthalmic Ointment 0.5 inch, RIGHT Eye, Q12H Now  Polyethylene Glycol 3350 (MIRALAX) Oral Powder Packet 17 g, ORAL, QAM  Sennosides (SENOKOT) Tablet 17.2 mg, ORAL, Daily Bedtime  Warfarin Patient Flag, NOT APPLICABLE, PATIENT FLAG ONLY    IV Medications   PRN Medications  Albuterol (PROAIR HFA, PROVENTIL HFA, VENTOLIN HFA) Inhaler 1-2 puff, INHALATION, Q4H PRN  Bisacodyl (DULCOLAX) Suppository 10 mg, RECTALLY, Q24H PRN  Camphor/Menthol (SARNA) Lotion, TOPICAL, BID prn  Dextrose 50% Injection 12.5 g, IV, PRN  Dextrose 50% Injection 25 g, IV, PRN  Glucagon Injection 1 mg, IM, PRN  Guaifenesin -Dextromethorphan /25ml (ROBITUSSIN DM) Syrup 10 mL, ORAL, Q4H PRN  HydrALAZINE (APRESOLINE) Injection 10 mg, IV, Q4H PRN  Hydromorphone (DILAUDID) Tablet 2 mg, ORAL, Q4H PRN  Mineral Oil (FLEET) Enema 1 enema, RECTALLY, Bedtime PRN  Ondansetron (ZOFRAN) Injection 4 mg, IV, Q8H PRN  Promethazine (PHENERGAN) Injection 12.5 mg, IV, Q6H PRN      OBJECTIVE:   Vital Signs  Summary  Temp Min: 36.7 C (98 F) Max: 37.1 C (98.8 F)  BP: (150-161)/(79-90)   Pulse  Min: 74 Max: 80  Resp Min: 16 Max: 18  SpO2 Min: 98 % Max: 100 %      Current Vitals  Temp: 37.1 C (98.8 F)  BP: 150/82  Pulse: 77  Resp: 18  SpO2: 100 %      Weight: 106.2 kg (234 lb 2.1 oz)     Intake and Output  Last Two Completed Shifts  In: 1160 [Oral:1160]  Out: 1975 [Urine:1950; Emesis:25]    Current Shift  In: 540 [Oral:540]  Out: 1100 [Urine:1100]    Physical Exam  General: obese female, No acute distress, sitting up in hospital bed in NAD  HEENT:  Moist oral mucosa.  R eye  chronic blindness. Left pupil reactive to light.   CV: Regular rate and rhythm, nl s1s2, no murmurs, rubs or gallops. No JVD.  Pulm: CTAB  Abd: Soft NT ND, +BS. No rebound or guarding  Skin: No rashes. No jaundice.  Neuro: face symmetric, moving all 4 extremities spontaneously.    Extrem: trace LE ankle edema b/l.    LINES AND DRAINS:    piv    LAB TESTS/STUDIES:   I personally reviewed the following report(s)  .  Lab Results - 24 hours (excluding micro and POC)   BASIC METABOLIC PANEL     Status: Abnormal   Result Value Status    SODIUM 137 Final    POTASSIUM 4.8 Final    CHLORIDE 105 Final    CARBON DIOXIDE TOTAL 23 (L) Final    UREA NITROGEN, BLOOD (BUN) 65 (H) Final    CREATININE BLOOD 4.18 (H) Final    E-GFR, AFRICAN AMERICAN 14 (L) Final    E-GFR, NON-AFRICAN AMERICAN 12 (L) Final    GLUCOSE 162 (H) Final    CALCIUM 8.9 Final   MAGNESIUM (MG)     Status: None   Result Value Status    MAGNESIUM (MG) 2.1 Final   PHOSPHORUS (PO4)     Status: None   Result Value Status    PHOSPHORUS (PO4) 5.0 Final   CBC WITH DIFFERENTIAL     Status: Abnormal   Result Value Status    WHITE BLOOD CELL COUNT 3.6 (L) Final    RED CELL COUNT 4.35 Final    HEMOGLOBIN 9.4 (L) Final    HEMATOCRIT 30.6 (L) Final    MCV 70.3 (L) Final    MCH 21.7 (L) Final    MCHC 30.8 (L) Final    RDW 19.4 (H) Final    MPV 7.4 Final    PLATELET COUNT 298 Final    NEUTROPHILS % AUTO 53.3 Final    LYMPHOCYTES % AUTO 31.4 Final    MONOCYTES % AUTO 8.7 Final    EOSINOPHIL % AUTO 5.2 Final    BASOPHILS % AUTO 1.4 Final    NEUTROPHIL ABS AUTO 1.90 Final    LYMPHOCYTE ABS AUTO 1.1 Final    MONOCYTES ABS AUTO 0.3 Final    EOSINOPHIL ABS AUTO 0.2 Final    BASOPHILS ABS AUTO 0.10 Final   INR     Status: Abnormal   Result Value Status    INR 2.35 (H) Final     INFLUENZA A Negative  Negative  Final     INFLUENZA B Negative  Negative  Final    RESPIRATORY SYNCYTIAL VIRUS Negative  Negative  Final    PARAINFLUENZA Negative  Negative  Final    CORONAVIRUS Negative   Negative  Final    HUMAN METAPNEUMOVIRUS Negative  Negative  Final    RHINOVIRUS/ENTEROVIRUS POSITIVE (Abnl) Negative  Final    ADENOVIRUS Negative  Negative  Final    HUMAN BOCAVIRUS Negative  Negative  Final    CHLAMYDOPHILA PNEUMONIAE Negative  Negative  Final    MYCOPLASMA PNEUMONIAE Negative  Negative  Final    Comment:       Xray left foot:  Flattening and fragmentation of the navicular bone, compatible with  subacute fracture. Regions of sclerosis within the navicular bone likely  reflect osteonecrosis.  Old healed fracture of the proximal fifth metatarsal.    CXR 11/06/15  IMPRESSION:  STABLE CARDIOMEGALY. CONSIDER PERICARDIAL EFFUSION.  MILD INCREASE IN INTERSTITIAL MARKINGS, ESPECIALLY IN THE LEFT PERIHILAR  REGION AND LUNG BASE. THE FINDINGS ARE COMPATIBLE WITH PULMONARY EDEMA BUT A BILATERAL INFECTIOUS PROCESS COULD HAVE A SIMILAR APPEARANCE    ASSESSMENT & PLAN:    50 year old female past medical history of DM-2, CKD-4, PE on warfarin, dCHF, HTN with CHF exacerbation and subacute vitreal hemorrhage requiring ophtho retinal surgery which occurred but delayed due to supratherapeutic INR when admitted.    Glaucoma  Vitreal hemorrhage 2/2 supratherapeutic INR  Diabetic retinopathy  Right eye blindness  Transferred to [place] for urgent retinal surgery for elevated IOP but procedure delayed due to supratherapeutic INR and vitrious hemorrage. S/p vitrectomy and laser photocoagulation by [place] [facility] on 5/25.   - continue brimonidine TID to both eyes, maxitrol tapered to R eye BID x 2 more weeks  - F/u ophthalmology in 3 weeks ([place] vs [facility])  - continue po dilaudid 2mg  Q4H PRN for pain    HTN  Persistently hypertensive. Already on hydralazine 100mg  TID, amlodipine 10mg  daily, and lasix 80mg  daily.   - Started on coreg in-house with improving BP control. Increase to 25mg  BID today.     AKI on CKD  Acute on chronic heart failure, diastolic  Baseline Cre 2.3, presented with Cr 3.5 which rose with diuresis.  Was given IVF with minimal improvement. Has symmetric 1-2+ pitting edema of lower extremities. BNP improved from 5/20, thus unlikely 2/2 cardiorenal. BP now controlled. Hyperphosphatemia improved with phosphate binder  - continue sevelamer for hyperphosphatemia  - renally dose medications and avoid nephrotoxins  - Monitor Cr  - continue lasix 80mg  po daily   - Fluid restriction, daily standing weight    Anemia of chronic disease  Iron studies suggestive of anemia of chronic disease.  No evidence of acute blood loss.    Cough  Rhinorhea, resovled  Patient with rhinovirus, but no longer with significant secretions. Cough improved. Isoaltion d/c.   - robitussin DM PRN    DM2  Peripheral neuropathy  Retinopathy  A1c in march was 9.8, sugars remain consistently above 200's. Repeat A1c in May was 7.6%.   Gabapentin reduced to 100mg  TID given renal function. Lantus added 6/1  - continue lantus 8u QHS + SSI with meals    PE (saddle embolus) status post IVC filter  supratherapeutic INR  The PE was diagnosed 3 years ago at [facility] with IVC placed with plan for lifelong AC given unprovoked and severity.  Feb V/Q without evidence of PE. Was on coumadin 20mg  daily resulting in supratherapeutic INR on presentation here.  Now with therapeutic INR but have not been able to find a stable Coumadin dose.  [facility] pharmacist Sharia Reeve) brought up the idea of switching to a NOAC, but one concern is that she had spontaneous hemorrhage of the eye (though coumadin was supratherapeutic) and she  has worsening CKD4.   - [facility] pharmacy to follow  - warfarin 6mg  x 1 today  - could consider switching from warfarin to NOAC    Constipation  - lactulose Q6H PRN (hold for loose stools)  - routine bowel regimen    Left foot fracture  Recent Charcot's neuropathic osteoarthropathy left foot versus neuropathic fracture of the navicular bone and fifth metatarsal. Seen by ortho with worsening osteonecrosis on xray.  Patient must wear orthotics (CAM  boot) boot when ambulating.   PT rec SNF  Patient will need follow up ortho outpt ~1wk following discharge.    FEN: renal/diabetic diet  DVT Prophylaxis: Coumadin  Code status: Full  Dispo: Medically stable for discharge back to SNF    Erin Hearing, MD   Internal Medicine Attending  Hospitalist Division  Pager: [phone]  PI: [phone]

## 2015-11-09 NOTE — Best Poss Rx Hx (Addendum)
Best Possible Medication History (BPMH) Note   Leslie Hernandez   11/09/2015  14:37  [facility] Medicine Faculty Service    Sources:   Primary:   Institutional Document (Name: [facility]; number: [phone])   Secondary:   N/A  Additional:   N/A    Patient Understanding of Medications:   High (understands indications, dose, strength, and frequency of most meds)    Legend  Red text with  symbol: action required or info re: adherence  Blue text: FYI comments    Follow-up/Pending Medication Entries:  MD Further Review Needed: 0      Medication List:   Prior to Admission Medications   Prescriptions   Acetaminophen (TYLENOL) 325 mg Tablet   Sig: Take 2 tablets by mouth every 4 hours if needed. ** for MILD pain **  - Do not exceed max daily dose of 3000 mg of Tylenol/Acetaminophen from all sources OR Less as directed by your doctor. -   Albuterol (VENTOLIN HFA) 90 mcg/actuation inhaler   Sig: Take 2 puffs by inhalation every 4 hours if needed for wheezing.   Amlodipine (NORVASC) 10 mg Tablet   Sig: Take 1 tablet by mouth every day. Indications: hypertension      Atorvastatin (LIPITOR) 80 mg tablet   Sig: Take 1 tablet by mouth every day.      Bisacodyl (DULCOLAX) 10 mg Suppository   Sig: Insert 1 suppository into the rectum once daily if needed. ** for bowel care every third day, no bowel movement when milk of magnesia is ineffective **   Brimonidine (ALPHAGAN) 0.2 % Ophthalmic Solution   Sig: Instill 1 drop into EACH eye 3 times daily.   Calcium-Cholecalciferol, D3, (CALCIUM 600 + D) 600 mg(1,500mg ) -400 unit Tablet   Sig: Take 1 tablet by mouth 2 times daily.   Cholecalciferol, Vitamin D3, (VITAMIN D-3) 400 unit Tablet   Sig: Take 1 tablet by mouth every day.   Dorzolamide 2%/Timolol 0.5% (COSOPT) Ophthalmic Solution   Sig: Instill 1 drop into EACH eye 2 times daily.   FUROsemide (LASIX) 40 mg Tablet   Sig: Take 1 tablet by mouth every day.   Gabapentin (NEURONTIN) 300 mg Capsule   Sig: Take 1 capsule by mouth  3 times daily.   HydrALAZINE (APRESOLINE) 100 mg tablet   Sig: Take 1 tablet by mouth 3 times daily.   Insulin Lispro, Human, (HUMALOG KWIKPEN) 100 unit/mL Pen   Sig: Inject subcutaneously before meals and at bedtime as directed per sliding scale;if blood glucose is below 60 and resident is alert, give snack, if not alert start hypoglycemia protocol;  if 150-200= 1 unit; 201-250= 2 units; 251-300= 3 units; 301-350= 4 units; 351-400= 5 units; 401 and above= 6 units. Call MD for blood glucose above 400   Insulin NPH Human Recomb (HUMULIN N KWIKPEN) 100 unit/mL (3 mL) Pen   Sig: Inject 12 Units subcutaneously every day.   Latanoprost (XALATAN) 0.005 % Ophthalmic Solution   Sig: Instill 1 drop into EACH eye every day at bedtime.   Magnesium Hydroxide (MILK OF MAGNESIA CONCENTRATED) 2,400 mg/10 mL Suspension   Sig: Take 10 mL by mouth once daily if needed. ** bowel care, every third day if no bowel movement **   Meclizine (ANTIVERT) 12.5 mg Tablet   Sig: Take 1 tablet by mouth every 6 hours if needed for Dizziness.   Melatonin 3 mg Tablet   Sig: Take 1 tablet by mouth every day at bedtime if needed for iThyra Yingernsomnia.  Multivitamins with Minerals 1 tab tablet   Sig: Take 1 tablet by mouth every day.   NUT.TX.GLUCOSE INTOLERANCE,SOY (GLUCERNA PO)   Sig: Take 1 can by mouth 2 times daily.   Ondansetron 4 mg Film   Sig: Take 4 mg by mouth every 8 hours if needed.   Prochlorperazine (COMPAZINE) 10 mg Tablet   Sig: Take 1 tablet by mouth every 6 hours if needed. Indications: Nausea and Vomiting   Saccharomyces boulardii (FLORASTOR) 250 mg Capsule   Sig: Take 1 capsule by mouth 2 times daily.   Sennosides (SENNA) 8.6 mg Tablet   Sig: Take 1 tablet by mouth two times daily if needed for constipation.   Sodium Phosphates (FLEET ENEMA) 19-7 gram/118 mL Enema   Sig: Insert 1 enema into the rectum once daily if needed. ** for bowel care every third day, if no bowel movement when milk of magnesia and dulcolax suppository are ineffective  **   Tramadol (ULTRAM) 50 mg Tablet   Sig: Take 1 tablet by mouth every 6 hours if needed for pain.   WARFARIN SODIUM (WARFARIN PO)   Sig: Take 10 mg by mouth on Sunday, Monday, Tuesday, Thursday and Saturday and 12.5 mg by mouth on Wednesday and Friday.   ferrous sulfate (FERRO-TIME) 325 mg (65 mg iron) Tablet   Sig: Take 1 tablet by mouth 2 times daily.              Patient reported; however, unable to confirm with pharmacy or insurance: 0    Prescribed, but not taking: 0    Flagged for removal (pharm tech unable to delete non-patient reported entries): 0        Added: 25   1. Acetaminophen (TYLENOL) 325 mg Tablet  2. Albuterol (VENTOLIN HFA) 90 mcg/actuation inhaler  3. Amlodipine (NORVASC) 10 mg Tablet  4. Atorvastatin (LIPITOR) 80 mg tablet  5. Bisacodyl (DULCOLAX) 10 mg Suppository  6. Brimonidine (ALPHAGAN) 0.2 % Ophthalmic Solution  7. Calcium-Cholecalciferol, D3, (CALCIUM 600 + D) 600 mg(1,500mg ) -400 unit Tablet  8. Cholecalciferol, Vitamin D3, (VITAMIN D-3) 400 unit Tablet  9. Dorzolamide 2%/Timolol 0.5% (COSOPT) Ophthalmic Solution  10. FUROsemide (LASIX) 40 mg Tablet  11. Gabapentin (NEURONTIN) 300 mg Capsule  12. HydrALAZINE (APRESOLINE) 100 mg tablet  13. Insulin Lispro, Human, (HUMALOG KWIKPEN) 100 unit/mL Pen  14. Insulin NPH Human Recomb (HUMULIN N KWIKPEN) 100 unit/mL (3 mL) Pen  15. Latanoprost (XALATAN) 0.005 % Ophthalmic Solution  16. Magnesium Hydroxide (MILK OF MAGNESIA CONCENTRATED) 2,400 mg/10 mL Suspension  17. Meclizine (ANTIVERT) 12.5 mg Tablet  18. Melatonin 3 mg Tablet  19. Multivitamins with Minerals 1 tab tablet  20. GLUCERNA  21. Ondansetron 4 mg Film  22. Prochlorperazine 10 mg Tablet  23. Saccharomyces boulardii (FLORASTOR) 250 mg Capsule  24. Sennosides (SENNA) 8.6 mg Tablet  25. Sodium Phosphates (FLEET ENEMA) 19-7 gram/118 mL Enema     Removed (patient reported no longer current or incomplete entry):3  1. Gabapentin 100 mg  2. Humalog kwikpen incomplete entry  3. Hydralazine 25  mg     Updated Dose/Strength/Route/Frequency: 0      Educated the patient and/or caregiver on the importance of carrying with them an accurate and up-to-date medication list.     This note is documentation that a Best Possible Medication History (BPMH) has been completed using the most reliable sources at the moment. Information may be subject to change.   Above information communicated to provider for review.     Kelby Fam  Thornton Parkabralez, Pharm Tech  Transition of Care Pharmacy Team  Pager: [phone]    Estimated total time spent: 60 min      High Risk Criteria: no      Physician Attestation:  I thoroughly reviewed the patient's medication history and compared the patient's inpatient medication orders [facility]. Orders updated and changes made as appropriate.     Erin Hearinglara Rande Roylance, MD

## 2015-11-09 NOTE — Nurse Assessment (Signed)
ASSESSMENT NOTE    Note Started: 11/09/2015, 07:30     Initial assessment completed and recorded in EMR.  Report received from night shift nurse and orders reviewed. Patient reports 7/10 r-eye pain.  The plan for the day is adl's, safety, and pain management..    Plan of Care reviewed and appropriate, discussed with patient.   Kari BaarsJoshua Maleta Pacha, RN.

## 2015-11-09 NOTE — Plan of Care (Signed)
Problem: Patient Care Overview (Adult)  Goal: Plan of Care Review  Outcome: Ongoing (interventions implemented as appropriate)  Goal Outcome Evaluation Note     Leslie Hernandez is a [age]yr female admitted 10/20/2015      OUTCOME SUMMARY AND PLAN MOVING FORWARD:   Patient was complaining of nausea at the start of the shift, Phenergan was given, reported nausea relief.   Slept with intervals. BP is elevated in the 160s, due Hydralazine given. VSS. Closely monitored.      Goal: Individualization and Mutuality  Outcome: Ongoing (interventions implemented as appropriate)  Goal: Discharge Needs Assessment  Outcome: Ongoing (interventions implemented as appropriate)    Problem: Sleep Pattern Disturbance (Adult)  Goal: Adequate Sleep/Rest  Patient will demonstrate the desired outcomes by discharge/transition of care.   Outcome: Ongoing (interventions implemented as appropriate)    Problem: Fall Risk (Adult)  Goal: Absence of Falls  Patient will demonstrate the desired outcomes by discharge/transition of care.   Outcome: Ongoing (interventions implemented as appropriate)    Problem: Mobility, Physical Impaired (Adult)  Goal: Enhanced Mobility Skills  Patient will demonstrate the desired outcomes by discharge/transition of care.   Outcome: Ongoing (interventions implemented as appropriate)  Goal: Enhanced Functionality Ability  Patient will demonstrate the desired outcomes by discharge/transition of care.   Outcome: Ongoing (interventions implemented as appropriate)    Problem: Fluid Volume Excess (Adult,Obstetrics,Pediatric)  Goal: Stable Weight  Patient will demonstrate the desired outcomes by discharge/transition of care.   Outcome: Ongoing (interventions implemented as appropriate)  Goal: Balanced Intake/Output  Patient will demonstrate the desired outcomes by discharge/transition of care.   Outcome: Ongoing (interventions implemented as appropriate)    Problem: Pain, Acute (Adult)  Goal: Acceptable Pain Control/Comfort  Level  Patient will demonstrate the desired outcomes by discharge/transition of care.   Outcome: Ongoing (interventions implemented as appropriate)    Problem: Pressure Ulcer Risk (Braden Scale) (Adult,Obstetrics,Pediatric)  Goal: Skin Integrity  Patient will demonstrate the desired outcomes by discharge/transition of care.   Outcome: Ongoing (interventions implemented as appropriate)

## 2015-11-09 NOTE — Allied Health Progress (Signed)
Physical Therapy Progress Note     Date of Service: 11/09/2015   Time in: 1530  Total time: not applicable  Minutes    S: 8/10 pain located in stomach . Patient feeling well earlier, but now belly pain and not able to mobilize. Patient describes course of event leading to blindness and current stay here, issues with her SNF vs coumadin dosing vs getting her to appointments.    O: patient supine, head of bed up, preferring visitors on her R side.  - patient refusing out-of-bed with stomach pain, RN alerted, patient education to use call button for high pain.  - patient and RN education re cancellation of non weight bearing order for L lower extremity and new order for out-of-bed with cam boot.   - patient with call button after treatment, report to RN re above    A: patient needing continued treatment for gait training with CAM boot on, bed mobility, transfer training. Consult with primary PT re cancellation of non weight bearing order, per PT OK to ambulate with weight bearing L leg in cam boot based on new order.    P: Continue with plan of care towards goals, gait training cam boot, transfer training      Leslye Peerobert Damone Fancher PTA  PI# [id number]

## 2015-11-09 NOTE — Allied Health Progress (Signed)
NUTRITION SCREENING     Admission Date: 10/20/2015   Date of Service: 11/09/2015, 1211:742     50 year old female past medical history of DM-2, CKD-4, PE on warfarin, dCHF, HTN with CHF exacerbation and subacute vitreal hemorrhage requiring ophtho retinal surgery which occurred but delayed due to supratherapeutic INR when admitted.    Patient reports eating well, has c/o limited food choices, and less variety of food.  Drinking Nepro supplements if does not eat well.  Patient with average intake of 74% meals consumed over past 7 days.  PO intake appears adequate to meet patient's nutrition needs.      Reviewed renal diet restrictions with patient.  Topics Reviewed:   - Moderate Protein intake  - High sodium, potassium, and phosphorus foods to avoid  - Relationship between sodium intake and fluid management  - Phosphorus binders  - Renal grocery shopping list    Handouts provided: "Nutrition and Chronic Kidney Disease"  Education participants: Patient  See MPER for learning assessment.    Patient does not present at acute nutrition risk. No further nutrition intervention is warranted at this time. Will continue routine nutrition monitoring. Please consult Registered Dietitian if acute nutrition issues arise.    Report Electronically Signed By: Zeb ComfortHarjeet Louiza Moor, MS, RD, CDE pager [phone]

## 2015-11-09 NOTE — Allied Health Progress (Signed)
Occupational Therapy Progress Note    Date of Service: 11/08/2015  Time in: 1215  Total time: 30 Minutes    S: 4/10 pain located in back. Feeling nausea     O: Patient  seen for Occupational Therapy Interim Assessment. Treatment additionally included  Functional transfer training to toilet, ADLs, Manual wheeled chair mobility     A: patient functional ability is limited by decreased vision and     P: Continue OT per plan of care outlined in 11/08/2015 Interim Report.    Reported by:   Arma HeadingBright Ladarien Beeks, OTR/L II,    Dept. of Physical Medicine & Rehabilitation (Acute Care Services)   Occupational Therapies  PI #: [id number]  Vocera: [phone]  Phone: [phone]

## 2015-11-09 NOTE — Allied Health Progress (Addendum)
Pastoral Care Progress Note                                 Spiritual Assessment Form    Chaplain: Clayburn PertNnaemeka Dagny Fiorentino, Chaplain Date of Note:  11/09/2015   Patient Name:  Leslie Hernandez Unit:  [facility] Medicine Faculty Service   Faith Affiliation/Tradition: NO RELIGIOUS PREF Age:  [age]yr  Sex:  female   Admit Date:  10/20/2015 Admit Diagnosis:      Spouse/Significant Other Name/Relationship: There was no family at the bedside.    Reason(s) for visit: Patient request,Emotionally troubled due to the death of her only child.    Current Religious/Spiritual Practices: [employer].    Spiritual Strengths:  Patient expressed feeling(s) such as unhappy, weighed down, sad,and grief.. Patient also expressed meaning due to togetherness that existed between her and the deceased daughter.     Patient Response to Visit:  Receptive     Pastoral/Spiritual Needs Assessment: Grief/Loss of the only child whom she called a "friend".. Ritual Needs: Prayer and encouragement.    Impact of Needs on Patient's and Others: Accepting. Feeling abandoned. Frustration. Hopelessness. Loneliness. Meaninglessness. Sadness.    Pastoral/Spiritual Care Intervention: Provided prayer/scripture, facilitated relaxation response, explored beliefs/values, provided opportunities for trust building, facilitated articulation of spiritual concerns, assisted in identifying spiritual strengths and challenges, grief/loss support, life review facilitated, pastoral care and compassionate presence provided.     Outcomes of Care: Spiritual issues identified, Acceptance, Calm, peaceful, Emotional release, Increased hopefullness, Meaning and purpose, Progress with grief, Progress with trust, Religious / spiritual resources used and Spiritual comfort.    Continuity of Care / Goals:    1. Continuing care: Follow-up by the floor chaplain is commendable.    2. Referrals: None        Contact Information:   Trude Mcburneyhaplain Donda Friedli  Pager # [phone]

## 2015-11-09 NOTE — Allied Health Progress (Addendum)
Intensive Case Management Follow Up Note    Comments: CM called [facility], [place],  [phone], re: SNF approval for [facility], as per ICM Durward Mallard Martin's note from yesterday, [facility] will approve SNF, pending bed availability at the facility.  Left vm.      CM called Jan at [facility], [phone], assigned CM Basil Dess spoke with yesterday.  Left a vm.      CM reviewed SNF referral and now [facility] has responded "no" that they cannot accept pt.  Thus cm called them to verify, pH: [phone], and per Angie they have changed their answer to "no" because of pt's non-compliance at prior SNF, pt would take off for days at a time via "Lyft" and pt has no solid dispo plan after SNF.      CM spoke with pt at bedside.  CM provided pt with update on [facility] declining pt, which pt was upset to hear.  Pt reported she never called [employer] to take off for days from SNF; however, what she did do was called [employer] to take her to her eye appts, as the facility she was in, [facility], would not call her an ambulance.  She also relayed the facility would not attend to her medical appts she needed, even though she was going blind, and thus she made them herself, had Ly[employer] take her there, and she would return.  Pt stated she is upset to hear that [facility] clearly spoke with [facility], who falsely reported her status to them, and now she is "blacklisted" with regard to any SNF taking her.  Pt amenable to R & B, but is concerned about foot ulcer, of which [facility] did not tend to.  Pt stated she is blind in right and limited vision in left, and thus she is concerned about making sure wound is tended to.  Pt aware that R & B won't provide wound care, and even if cm gets HH, pt won't get it daily; however, pt is willing to learn how to care for it.     CM spoke with bedside RN, Sharia Reeve, who reported there is not wound care order for pt, but they are washing it with soap and water  and putting mepilex on daily.  CM asked Josh if this is something that they could teach pt, of which Josh stated yes, they could teach pt how to put mepilex on it.      CM spoke with Merry Proud at [facility], who was off for several days and did not know that [facility] has changed their mind.  She will discuss with [facility] to see if SNF will still be authorized and if any accepting facility, but she is also ok with CM trying to locate a R & B for pt as well.  If R & B located, then pt would be provided with f/u outpatient appts and Mikael Spray can try to secure [facility] services.      CM referred pt to Gwendolyn Fill, [facility], [phone], to have her begin looking for R & B.      @ 15:02 CM received call from [place] at [facility].  She informed CM she spoke with placement unit and they are still searching for SNF placement.  A [place] and outside area search is ongoing.  She asked weekend CM to call [facility] to determine if placement located.  Plan:  1.  Await response from [facility], [phone], fx [phone] re: SNF placement & if they will authorize SNF LOC; if so, have they located one.  WEEKEND CM to f/u with [facility] to see if placement located.      VS    2.  If [facility] will not authorize SNF, CM has made referral for [facility] to locate a Room and Board for pt.  Pt will need HH and [facility], if this is the d/c plan.      3.  Misty StanleyLisa from [facility], [phone], is searching for Room and Board LOC.      4.  If pt to d/c to R & B LOC, then pt will need to be taught self care of foot ulcer & be provided with a FWW.    5.  Chart copy has been ordered to the floor, in the event SNF is located via [facility].        Date: 11/09/2015 Time: 11:27  Theotis BurrowKim Kelcie Currie, MSW Intensive Clinical Case Manager/ Discharge Planner  [phone], [phone]

## 2015-11-09 NOTE — Nurse Assessment (Signed)
ASSESSMENT NOTE    Note Started: 11/09/2015, 20:01     Initial assessment completed and recorded in EMR.  Report received from day shift nurse and orders reviewed. Plan of Care reviewed and appropriate, discussed with patient.  Enriqueta ShutterGenevieve Man Bonneau, RN RN

## 2015-11-10 ENCOUNTER — Other Ambulatory Visit: Payer: Self-pay

## 2015-11-10 DIAGNOSIS — K5900 Constipation, unspecified: Secondary | ICD-10-CM

## 2015-11-10 DIAGNOSIS — S92902A Unspecified fracture of left foot, initial encounter for closed fracture: Secondary | ICD-10-CM

## 2015-11-10 LAB — MAGNESIUM (MG): MAGNESIUM (MG): 2 mg/dL (ref 1.5–2.6)

## 2015-11-10 LAB — PHOSPHORUS (PO4): PHOSPHORUS (PO4): 4.7 mg/dL (ref 2.4–5.0)

## 2015-11-10 LAB — CBC WITH DIFFERENTIAL
BASOPHILS % AUTO: 0.8 %
BASOPHILS ABS AUTO: 0 10*3/uL (ref 0–0.2)
EOSINOPHIL % AUTO: 4 %
EOSINOPHIL ABS AUTO: 0.1 10*3/uL (ref 0–0.5)
HEMATOCRIT: 26 % — AB (ref 34–46)
HEMOGLOBIN: 8.1 g/dL — AB (ref 12.0–16.0)
LYMPHOCYTE ABS AUTO: 1.2 10*3/uL (ref 1.0–4.8)
LYMPHOCYTES % AUTO: 34 %
MCH: 21.9 pg — AB (ref 27–33)
MCHC: 31.1 % — AB (ref 32–36)
MCV: 70.4 UM3 — AB (ref 80–100)
MONOCYTES % AUTO: 11 %
MONOCYTES ABS AUTO: 0.4 10*3/uL (ref 0.1–0.8)
MPV: 7.3 UM3 (ref 6.8–10.0)
NEUTROPHIL ABS AUTO: 1.8 10*3/uL (ref 1.80–7.70)
NEUTROPHILS % AUTO: 50.2 %
PLATELET COUNT: 241 10*3/uL (ref 130–400)
RDW: 19 U — AB (ref 0–14.7)
RED CELL COUNT: 3.69 10*6/uL — AB (ref 3.7–5.5)
WHITE BLOOD CELL COUNT: 3.6 10*3/uL — AB (ref 4.5–11.0)

## 2015-11-10 LAB — BASIC METABOLIC PANEL
CALCIUM: 8.4 mg/dL — AB (ref 8.6–10.5)
CARBON DIOXIDE TOTAL: 23 meq/L — AB (ref 24–32)
CHLORIDE: 110 meq/L (ref 95–110)
CREATININE BLOOD: 4.09 mg/dL — AB (ref 0.44–1.27)
E-GFR, AFRICAN AMERICAN: 14 SEE NOTE — AB (ref >60)
E-GFR, NON-AFRICAN AMERICAN: 12 — AB (ref >60)
GLUCOSE: 135 mg/dL — AB (ref 70–99)
POTASSIUM: 4.5 meq/L (ref 3.3–5.0)
SODIUM: 140 meq/L (ref 135–145)
UREA NITROGEN, BLOOD (BUN): 70 mg/dL — AB (ref 8–22)

## 2015-11-10 LAB — INR: INR: 2.15 — AB (ref 0.87–1.18)

## 2015-11-10 MED ORDER — LACTOBACILLUS RHAMNOSUS GG 10 BILLION CELL CAPSULE
1.0000 | ORAL_CAPSULE | Freq: Every day | ORAL | Status: DC
Start: 2015-11-11 — End: 2015-11-18
  Administered 2015-11-11 – 2015-11-18 (×8): 1 via ORAL
  Filled 2015-11-10 (×8): qty 1

## 2015-11-10 MED ORDER — WARFARIN 2 MG TABLET
6.0000 mg | ORAL_TABLET | ORAL | Status: AC
Start: 2015-11-10 — End: 2015-11-10
  Administered 2015-11-10: 6 mg via ORAL
  Filled 2015-11-10: qty 3

## 2015-11-10 NOTE — Nurse Assessment (Signed)
ASSESSMENT NOTE    Note Started: 11/10/2015, 20:02     Initial assessment completed and recorded in EMR.  Report received from day shift nurse and orders reviewed. Plan of Care reviewed and appropriate, discussed with patient.  Enriqueta ShutterGenevieve Gustava Berland, RN RN

## 2015-11-10 NOTE — Plan of Care (Signed)
Problem: Patient Care Overview (Adult)  Goal: Plan of Care Review  Outcome: Ongoing (interventions implemented as appropriate)  Goal Outcome Evaluation Note     Leslie Hernandez is a [age]yr female admitted 10/20/2015      OUTCOME SUMMARY AND PLAN MOVING FORWARD:      VSS. NAC. Pt showered today. Pt was suppose to transfer back to [facility] today, but refuses to go back there so [facility] looking for new placement. Will continue to monitor.         Goal: Individualization and Mutuality  Outcome: Ongoing (interventions implemented as appropriate)  Goal: Discharge Needs Assessment  Outcome: Ongoing (interventions implemented as appropriate)    Problem: Sleep Pattern Disturbance (Adult)  Goal: Adequate Sleep/Rest  Patient will demonstrate the desired outcomes by discharge/transition of care.   Outcome: Ongoing (interventions implemented as appropriate)    Problem: Fall Risk (Adult)  Goal: Absence of Falls  Patient will demonstrate the desired outcomes by discharge/transition of care.   Outcome: Ongoing (interventions implemented as appropriate)    Problem: Mobility, Physical Impaired (Adult)  Goal: Enhanced Mobility Skills  Patient will demonstrate the desired outcomes by discharge/transition of care.   Outcome: Ongoing (interventions implemented as appropriate)  Goal: Enhanced Functionality Ability  Patient will demonstrate the desired outcomes by discharge/transition of care.   Outcome: Ongoing (interventions implemented as appropriate)    Problem: Fluid Volume Excess (Adult,Obstetrics,Pediatric)  Goal: Stable Weight  Patient will demonstrate the desired outcomes by discharge/transition of care.   Outcome: Ongoing (interventions implemented as appropriate)    Problem: Pain, Acute (Adult)  Goal: Acceptable Pain Control/Comfort Level  Patient will demonstrate the desired outcomes by discharge/transition of care.   Outcome: Ongoing (interventions implemented as appropriate)    Problem: Pressure Ulcer Risk  (Braden Scale) (Adult,Obstetrics,Pediatric)  Goal: Skin Integrity  Patient will demonstrate the desired outcomes by discharge/transition of care.   Outcome: Ongoing (interventions implemented as appropriate)

## 2015-11-10 NOTE — Plan of Care (Signed)
Problem: Patient Care Overview (Adult)  Goal: Plan of Care Review  Outcome: Ongoing (interventions implemented as appropriate)  Goal Outcome Evaluation Note     Leslie Hernandez is a [age]yr female admitted 10/20/2015      OUTCOME SUMMARY AND PLAN MOVING FORWARD:   No acute changes observed during the night shift, [facility]. Pt had one Large loose/soft BM. Partial bath given.  No emesis, but pt complained of nausea to which PRN IV Zofran and phenergan provided to good effect. Pending placement. Will continue to monitor.      Goal: Discharge Needs Assessment  Outcome: Ongoing (interventions implemented as appropriate)    Problem: Sleep Pattern Disturbance (Adult)  Goal: Adequate Sleep/Rest  Patient will demonstrate the desired outcomes by discharge/transition of care.   Outcome: Ongoing (interventions implemented as appropriate)    Problem: Fall Risk (Adult)  Goal: Absence of Falls  Patient will demonstrate the desired outcomes by discharge/transition of care.   Outcome: Ongoing (interventions implemented as appropriate)    Problem: Mobility, Physical Impaired (Adult)  Goal: Enhanced Mobility Skills  Patient will demonstrate the desired outcomes by discharge/transition of care.   Outcome: Ongoing (interventions implemented as appropriate)  Goal: Enhanced Functionality Ability  Patient will demonstrate the desired outcomes by discharge/transition of care.   Outcome: Ongoing (interventions implemented as appropriate)    Problem: Fluid Volume Excess (Adult,Obstetrics,Pediatric)  Goal: Stable Weight  Patient will demonstrate the desired outcomes by discharge/transition of care.   Outcome: Ongoing (interventions implemented as appropriate)    Problem: Pain, Acute (Adult)  Goal: Acceptable Pain Control/Comfort Level  Patient will demonstrate the desired outcomes by discharge/transition of care.   Outcome: Ongoing (interventions implemented as appropriate)    Problem: Pressure Ulcer Risk (Braden Scale)  (Adult,Obstetrics,Pediatric)  Goal: Skin Integrity  Patient will demonstrate the desired outcomes by discharge/transition of care.   Outcome: Ongoing (interventions implemented as appropriate)

## 2015-11-10 NOTE — Allied Health Progress (Addendum)
Date of Service : 11/10/15    Clinical Case Management Weekend/Holiday Note    Comments:  Reviewed case and discussed with team.      [facility] [phone] who stated that they have [facility], however pt does not want to return to facility.  Please see CM note of 6/9 for further details.    Spoke with Leotis ShamesLauren with [facility] [phone] who stated that she will search to try to find an alternative SNF facility for pt as [facility].    4:45PM:  Spoke with [facility] with [facility] [phone] who stated that case has been sent to [facility] management for review.    Team MD and bedside RN notified of the above.    Further follow up needed:       1. CM to follow up with Lauren with [facility] [phone], fx [phone].    2.   If pt is not placed at a SNF, CM Geraldine ContrasDee has made referral for [facility] to locate a Room and Board for pt.  Pt will need HH and f/[facility]ts [facility], if this is the d/c plan.      3.   Misty StanleyLisa from [facility], [phone], is searching for Room and Board LOC.      4.   If pt to d/c to R & B LOC, then pt will need to be taught self care of foot ulcer & be provided with a FWW.    5.   Chart copy has been ordered to the floor, in the event SNF is located via [facility].      11/10/2015, 14:40    Electronically Signed by:    Mayer CamelLeticia Jasun Gasparini, LCSW, CCM   Pager: [phone]  Phone: [phone]

## 2015-11-10 NOTE — Nurse Assessment (Signed)
ASSESSMENT NOTE    Note Started: 11/10/2015, 07:26     Initial assessment completed and recorded in EMR.  Report received from night shift nurse and orders reviewed. Patient asleep, nad.  The plan for the day is ADL's, pain management, and safety.   Plan of Care reviewed and appropriate.   Kari BaarsJoshua Hadia Minier, RN.

## 2015-11-10 NOTE — Discharge Planning (AHS/AVS) (Addendum)
You have elected to discharge to a [facility] of your choice. You were provided with a FWW upon your discharge.     As you have [facility] insurance you will need to follow up with your [facility] Social Worker CM, AltaDanielle, [phone] for any services you may need. You will also need to follow up with Revonda Standardaphne, [facility], ph: [phone]/ main line [phone] to arrange your home health services if they are still warranted. Your assigned [facility] RN CM is Zenaida NieceVan who can be reached at , [phone].         If you have any questions or concerns related to the services listed above that were coordinated for your discharge, please contact [facility] Clinical Case Management  [phone].

## 2015-11-11 LAB — CBC WITH DIFFERENTIAL
BASOPHILS % AUTO: 1 %
BASOPHILS ABS AUTO: 0 10*3/uL (ref 0–0.2)
EOSINOPHIL % AUTO: 5.1 %
EOSINOPHIL ABS AUTO: 0.2 10*3/uL (ref 0–0.5)
HEMATOCRIT: 28.2 % — AB (ref 34–46)
HEMOGLOBIN: 8.6 g/dL — AB (ref 12.0–16.0)
LYMPHOCYTE ABS AUTO: 1.3 10*3/uL (ref 1.0–4.8)
LYMPHOCYTES % AUTO: 33.1 %
MCH: 21.6 pg — AB (ref 27–33)
MCHC: 30.4 % — AB (ref 32–36)
MCV: 71.3 UM3 — AB (ref 80–100)
MONOCYTES % AUTO: 8.2 %
MONOCYTES ABS AUTO: 0.3 10*3/uL (ref 0.1–0.8)
MPV: 7.4 UM3 (ref 6.8–10.0)
NEUTROPHIL ABS AUTO: 2.1 10*3/uL (ref 1.80–7.70)
NEUTROPHILS % AUTO: 52.6 %
PLATELET COUNT: 265 10*3/uL (ref 130–400)
RDW: 18.9 U — AB (ref 0–14.7)
RED CELL COUNT: 3.96 10*6/uL (ref 3.7–5.5)
WHITE BLOOD CELL COUNT: 4 10*3/uL — AB (ref 4.5–11.0)

## 2015-11-11 LAB — BASIC METABOLIC PANEL
CALCIUM: 8.4 mg/dL — AB (ref 8.6–10.5)
CARBON DIOXIDE TOTAL: 21 meq/L — AB (ref 24–32)
CHLORIDE: 106 meq/L (ref 95–110)
CREATININE BLOOD: 4.12 mg/dL — AB (ref 0.44–1.27)
E-GFR, AFRICAN AMERICAN: 14 SEE NOTE — AB (ref >60)
E-GFR, NON-AFRICAN AMERICAN: 12 — AB (ref >60)
GLUCOSE: 182 mg/dL — AB (ref 70–99)
POTASSIUM: 4.6 meq/L (ref 3.3–5.0)
SODIUM: 138 meq/L (ref 135–145)
UREA NITROGEN, BLOOD (BUN): 66 mg/dL — AB (ref 8–22)

## 2015-11-11 LAB — MAGNESIUM (MG): MAGNESIUM (MG): 1.9 mg/dL (ref 1.5–2.6)

## 2015-11-11 LAB — INR: INR: 2.32 — AB (ref 0.87–1.18)

## 2015-11-11 LAB — PHOSPHORUS (PO4): PHOSPHORUS (PO4): 4.3 mg/dL (ref 2.4–5.0)

## 2015-11-11 MED ORDER — WARFARIN 2 MG TABLET
6.0000 mg | ORAL_TABLET | ORAL | Status: AC
Start: 2015-11-11 — End: 2015-11-11
  Administered 2015-11-11: 6 mg via ORAL
  Filled 2015-11-11: qty 3

## 2015-11-11 NOTE — Plan of Care (Signed)
Problem: Sleep Pattern Disturbance (Adult)  Goal: Adequate Sleep/Rest  Patient will demonstrate the desired outcomes by discharge/transition of care.   Outcome: Ongoing (interventions implemented as appropriate)  Goal Outcome Evaluation Note     Leslie Hernandez is a [age]yr female admitted 10/20/2015      OUTCOME SUMMARY AND PLAN MOVING FORWARD:      No acute changes observed during the night shift, [facility]  No pain, emesis or nausea reported throughout the night shift. Pending placement. Will continue to monitor.        Problem: Fall Risk (Adult)  Goal: Absence of Falls  Patient will demonstrate the desired outcomes by discharge/transition of care.   Outcome: Ongoing (interventions implemented as appropriate)    Problem: Mobility, Physical Impaired (Adult)  Goal: Enhanced Mobility Skills  Patient will demonstrate the desired outcomes by discharge/transition of care.   Outcome: Ongoing (interventions implemented as appropriate)  Goal: Enhanced Functionality Ability  Patient will demonstrate the desired outcomes by discharge/transition of care.   Outcome: Ongoing (interventions implemented as appropriate)    Problem: Fluid Volume Excess (Adult,Obstetrics,Pediatric)  Goal: Stable Weight  Patient will demonstrate the desired outcomes by discharge/transition of care.   Outcome: Ongoing (interventions implemented as appropriate)    Problem: Pain, Acute (Adult)  Goal: Acceptable Pain Control/Comfort Level  Patient will demonstrate the desired outcomes by discharge/transition of care.   Outcome: Ongoing (interventions implemented as appropriate)    Problem: Pressure Ulcer Risk (Braden Scale) (Adult,Obstetrics,Pediatric)  Goal: Skin Integrity  Patient will demonstrate the desired outcomes by discharge/transition of care.   Outcome: Ongoing (interventions implemented as appropriate)

## 2015-11-11 NOTE — Allied Health Progress (Addendum)
Date of Service : 11/11/15    Clinical Case Management Weekend/Holiday Note    Comments:      Case discussed with Dr. Trisha Mangleiaz - patient is medically stable for SNF discharge.    Received call from lauren with [facility] [phone]. Per Leotis ShamesLauren, since patient does not want to [facility] the [facility], they will close the case. Lauren suggested that patient go to a shelter and then [facility] can coordinate outpatient PT, but she would need to arrange her own transportation.      Per review of  ICM/DCP notes, Room and Board placement has been initiated. Referral has been made to [facility] to locate a Room and Board for pt.    Further follow up needed:   Assigned ICM to follow      11/11/2015, 12:29      Electronically Signed by:    Leslie CoderMary Ninetta Adelstein, RN BSN  Clinical Case Manager  [facility]   Phone No. [phone]  Pager No. [phone]  Fax No: [phone]  E-Mail: [email or web address]

## 2015-11-11 NOTE — Progress Notes (Signed)
INTERNAL MEDICINE DAILY PROGRESS NOTE  Date: 11/11/2015  Time:  15:07      Leslie Hernandez is a 50yo F with DM2, CKD4, PE on coumadin, dCHF, and HTN who presented 5/20 for progressive right eye vision loss 2/2 vitreal hemorrhage about to undergo vitrectomy but found to have supratherapeutic INR in PACU so sent to ED where she was found to have dyspnea, cough, rhinorrhea, nausea, and vomiting with an INR 4.87. She was found to have a Rhinovirus URI as well as decompensated CHF and subsequently admitted for further medical management.     24H INTERVAL HISTORY / SUBJECTIVE:  - attempted to d/c to SNF yesterday.  When patient realized [facility] she became very upset.  She blames them for her blindness and states she will never go back there  - This morning without acute c/o    MEDICATIONS:  Scheduled Medications  Acetaminophen (TYLENOL) Tablet 1,000 mg, ORAL, Q8H  Amlodipine (NORVASC) Tablet 10 mg, ORAL, QAM  Atorvastatin (LIPITOR) Tablet 80 mg, ORAL, Daily Bedtime  Brimonidine (ALPHAGAN) 0.2 % Ophthalmic Solution 1 drop, BOTH Eyes, TID  Calcium Acetate (PHOSLO) Capsule 1,334 mg, ORAL, TID w/ meals  Carvedilol (COREG) Tablet 25 mg, ORAL, BID w/ meals  Docusate (COLACE) Capsule 200 mg, ORAL, BID  FUROsemide (LASIX) Tablet 80 mg, ORAL, QAM  Gabapentin (NEURONTIN) Capsule 100 mg, ORAL, TID  HydrALAZINE (APRESOLINE) Tablet 100 mg, ORAL, TID  Insulin Aspart (NOVOLOG) Injection Pen 1-4 Units, SUBCUTANEOUS, Daily Bedtime  Insulin Aspart (NOVOLOG) Injection Pen 1-6 Units, SUBCUTANEOUS, TID w/ meals - correction dose  Insulin Glargine (LANTUS) Injection 8 Units, SUBCUTANEOUS, Daily Bedtime  Lactobacillus (CULTURELLE) Capsule 1 capsule, ORAL, QAM  Lidocaine (LIDODERM) 5 %(700 mg/patch) Patch 2 patch, Transdermal, Q24H Now  Lidocaine patch REMOVAL 1 patch, Transdermal, Q24H Now  Neomycin/Polymyxin/Dexamethasone (MAXITROL) Ophthalmic Ointment 0.5 inch, RIGHT Eye, Q12H Now  Polyethylene Glycol 3350 (MIRALAX) Oral  Powder Packet 17 g, ORAL, QAM  Sennosides (SENOKOT) Tablet 17.2 mg, ORAL, Daily Bedtime  Warfarin Patient Flag, NOT APPLICABLE, PATIENT FLAG ONLY    IV Medications   PRN Medications  Albuterol (PROAIR HFA, PROVENTIL HFA, VENTOLIN HFA) Inhaler 1-2 puff, INHALATION, Q4H PRN  Bisacodyl (DULCOLAX) Suppository 10 mg, RECTALLY, Q24H PRN  Camphor/Menthol (SARNA) Lotion, TOPICAL, BID prn  Dextrose 50% Injection 12.5 g, IV, PRN  Dextrose 50% Injection 25 g, IV, PRN  Glucagon Injection 1 mg, IM, PRN  Guaifenesin 100mg -Dextromethorphan 10mg /685ml (ROBITUSSIN DM) Syrup 10 mL, ORAL, Q4H PRN  HydrALAZINE (APRESOLINE) Injection 10 mg, IV, Q4H PRN  Hydromorphone (DILAUDID) Tablet 2 mg, ORAL, Q4H PRN  Mineral Oil (FLEET) Enema 1 enema, RECTALLY, Bedtime PRN  Ondansetron (ZOFRAN) Injection 4 mg, IV, Q8H PRN  Promethazine (PHENERGAN) Injection 12.5 mg, IV, Q6H PRN      OBJECTIVE:   Vital Signs  Summary  Temp Min: 36.4 C (97.6 F) Max: 36.9 C (98.5 F)  BP: (123-170)/(75-92)   Pulse Min: 67 Max: 82  Resp Min: 16 Max: 18  SpO2 Min: 97 % Max: 100 %      Current Vitals  Temp: 36.9 C (98.5 F)  BP: 123/80  Pulse: 67  Resp: 16  SpO2: 100 %      Weight: 106.2 kg (234 lb 2.1 oz)     Intake and Output  Last Two Completed Shifts  In: 980 [Oral:980]  Out: 1800 [Urine:1800]    Current Shift  In: 320 [Oral:320]  Out: 0     Physical Exam    Physical  Exam:  General: Middle age black female, No acute distress, conversational. A+Ox3  HEENT: Moist oral mucosa. R eye chronic blindness.Left pupil reactive to light.   CV: Regular rate and rhythm, nl s1s2, no murmurs, rubs or gallops. No definite JVD.  Pulm: Coarse BS 2/2 transmitted upper airway noises. No appreciable rales or wheeze.  Abd: Soft NT ND, +BS. No rebound or guarding  Skin: No rashes. No jaundice.  Neuro: face symmetric, moving all 4 extremities spontaneously.   Extrem: trace edema bilaterally    LINES AND DRAINS:    piv    LAB TESTS/STUDIES:   I personally reviewed the  following report(s)  .  Lab Results - 24 hours (excluding micro and POC)   BASIC METABOLIC PANEL     Status: Abnormal   Result Value Status    SODIUM 138 Final    POTASSIUM 4.6 Final    CHLORIDE 106 Final    CARBON DIOXIDE TOTAL 21 (L) Final    UREA NITROGEN, BLOOD (BUN) 66 (H) Final    CREATININE BLOOD 4.12 (H) Final    E-GFR, AFRICAN AMERICAN 14 (L) Final    E-GFR, NON-AFRICAN AMERICAN 12 (L) Final    GLUCOSE 182 (H) Final    CALCIUM 8.4 (L) Final   MAGNESIUM (MG)     Status: None   Result Value Status    MAGNESIUM (MG) 1.9 Final   PHOSPHORUS (PO4)     Status: None   Result Value Status    PHOSPHORUS (PO4) 4.3 Final   INR     Status: Abnormal   Result Value Status    INR 2.32 (H) Final   CBC WITH DIFFERENTIAL     Status: Abnormal   Result Value Status    WHITE BLOOD CELL COUNT 4.0 (L) Final    RED CELL COUNT 3.96 Final    HEMOGLOBIN 8.6 (L) Final    HEMATOCRIT 28.2 (L) Final    MCV 71.3 (L) Final    MCH 21.6 (L) Final    MCHC 30.4 (L) Final    RDW 18.9 (H) Final    MPV 7.4 Final    PLATELET COUNT 265 Final    NEUTROPHILS % AUTO 52.6 Final    LYMPHOCYTES % AUTO 33.1 Final    MONOCYTES % AUTO 8.2 Final    EOSINOPHIL % AUTO 5.1 Final    BASOPHILS % AUTO 1.0 Final    NEUTROPHIL ABS AUTO 2.10 Final    LYMPHOCYTE ABS AUTO 1.3 Final    MONOCYTES ABS AUTO 0.3 Final    EOSINOPHIL ABS AUTO 0.2 Final    BASOPHILS ABS AUTO 0 Final     INFLUENZA A Negative  Negative  Final     INFLUENZA B Negative  Negative  Final    RESPIRATORY SYNCYTIAL VIRUS Negative  Negative  Final    PARAINFLUENZA Negative  Negative  Final    CORONAVIRUS Negative  Negative  Final    HUMAN METAPNEUMOVIRUS Negative  Negative  Final    RHINOVIRUS/ENTEROVIRUS POSITIVE (Abnl) Negative  Final    ADENOVIRUS Negative  Negative  Final    HUMAN BOCAVIRUS Negative  Negative  Final    CHLAMYDOPHILA PNEUMONIAE Negative  Negative  Final    MYCOPLASMA PNEUMONIAE Negative  Negative  Final    Comment:       Xray left foot:  Flattening and fragmentation of the  navicular bone, compatible with  subacute fracture. Regions of sclerosis within the navicular bone likely  reflect osteonecrosis.  Old healed  fracture of the proximal fifth metatarsal.    CXR 11/06/15  IMPRESSION:  STABLE CARDIOMEGALY. CONSIDER PERICARDIAL EFFUSION.  MILD INCREASE IN INTERSTITIAL MARKINGS, ESPECIALLY IN THE LEFT PERIHILAR  REGION AND LUNG BASE. THE FINDINGS ARE COMPATIBLE WITH PULMONARY EDEMA BUT A BILATERAL INFECTIOUS PROCESS COULD HAVE A SIMILAR APPEARANCE    ASSESSMENT & PLAN:      50 year old female past medical history of DM-2, CKD-4, PE on warfarin, dCHF, HTN with CHF exacerbation and subacute vitreal hemorrhage requiring ophtho retinal surgery which occurred but delayed due to supratherapeutic INR when admitted.    # Glaucoma  # Vitreal hemorrhage with increased IOP s/p urgent retinal surgery  # Diabetic retinopathy  # Right eye blindness  Transferred to [place] for urgent retinal surgery for elevated IOP but procedure delayed due to supratherapeutic INR and vitrious hemorrage. S/p vitrectomy and laser photocoagulatuion by ophthomology on 5/25.   - continue bromonidine gtt both eyes TID  - maxitrol R eye bid x 2 weeks (6/4-6/18)  - F/u 3 weeks as outpatient at [facility] or [place] per patient preference                          (schedule with [facility] Willaim Bane at [facility] or Dr. Olevia Bowens at [facility])    # HTN  Persistently hypertensive. Already on hydralazine  TID, amlodipine  daily, and lasix  daily. Started on coreg, uptitrating  - Coreg 25 mg bid in addition to above    #AKI on CKD  # Acute on chronic heart failure, diastolic  Baseline Cr 2.3, presented with Cr 3.5 which rose with diuresis.  Was given IVF with minimal improvement. Cr stable 4.09 (peak 4.18) on discharge. Has symmetric 1-2+ pitting edema of lower extremities. BNP improved from 5/20, thus unlikely 2/2 cardiorenal.  - Will attempt for better BP control, uptitrating coreg.   - renally dose medications and avoid nephrotoxins  - Monitor  Cr  - continue lasix  po daily  - Fluid restriction, daily standing weight  - outpatient referral nephrology for management    # Anemia  Iron studies suggestive of anemia of chronic disease.  Hgb stable 8-9 while in house.    # Cough  # rhinorhea  Patient with rhinovirus but has been in-house for 16 days, no longer having significant secretions.   - continue guaifenisin cough syrup PRN  - d/c isolation precautions    # DM2  # Peripheral neuropathy  # Retinopathy  A1c 7.6% in May 2017. Gabapentin reduced to  TID given renal function.  Lantus added 6/1  - continue lantus 8u QHS + SSI with meals    # PE (saddle embolus) status post IVC filter  # supratherapeutic INR  The PE was diagnosed 3 years ago at [facility] with IVC placed with plan for lifelong AC given unprovoked and severity. Feb V/Q without evidence of PE. Was on coumadin  daily resulting in supratherapeutic INR on presentation here. Now with therapeutic INR.  DOAC n/a given worsening CKD.  - [facility] pharmacy to follow  - warfarin  x 1 today and on discharge, f/u coumadin clinic      # Left foot fracture  Recent Charcot's neuropathic osteoarthropathy left foot versus neuropathic fracture of the navicular bone and fifth metatarsal. Seen by ortho with worsening osteonecrosis on xray.  - Patient must wear orthotics (CAM boot) boot when ambulating.   - Patient will need follow up ortho outpt ~1wk following  discharge.    # FEN: renal/diabetic diet  # DVT Prophylaxis: Coumadin  # Code status: Full  # Dispo: Medically stable for discharge back to SNF    Report Electronically Signed By:    Kennith Maes, MD  Associate Physician - Internal Medicine  Section of [facility] Medicine  [facility]   Pager # [phone]  PI # [id number]

## 2015-11-11 NOTE — Nurse Assessment (Signed)
ASSESSMENT NOTE    Note Started: 11/11/2015, 19:02       Received report from AM shift RN and orders reviewed. Patient is presently sleeping comfortably without any form of distress. As previously reported, patient did not have a restful night last night; plan of care will be reviewed with patient and assessment will be completed when patient is awake.     Joni FearsIan Gian Ybarra, RN, BSN

## 2015-11-11 NOTE — Nurse Assessment (Signed)
ASSESSMENT NOTE    Note Started: 11/11/2015, 08:00     Initial assessment completed and recorded in EMR.  Report received from night shift nurse and orders reviewed. Patient reports 3/10 R-eye pain.  The plan for the day is ADL's, safety, pain management, and possible [facility].  Plan of Care reviewed and updated, discussed with patient.   Kari BaarsJoshua Mamie Diiorio, RN.

## 2015-11-11 NOTE — Plan of Care (Signed)
Problem: Patient Care Overview (Adult)  Goal: Plan of Care Review  Outcome: Ongoing (interventions implemented as appropriate)  Goal Outcome Evaluation Note     Leslie Hernandez is a 89yr female admitted 10/20/2015      OUTCOME SUMMARY AND PLAN MOVING FORWARD:      VSS. NAC. Pt sleeping between care most of day; disruptive roommate made sleep difficult last night. Awaiting placement to snf via [facility] outside services. Will continue to monitor.         Goal: Individualization and Mutuality  Outcome: Ongoing (interventions implemented as appropriate)  Goal: Discharge Needs Assessment  Outcome: Ongoing (interventions implemented as appropriate)    Problem: Sleep Pattern Disturbance (Adult)  Goal: Adequate Sleep/Rest  Patient will demonstrate the desired outcomes by discharge/transition of care.   Outcome: Ongoing (interventions implemented as appropriate)    Problem: Fall Risk (Adult)  Goal: Absence of Falls  Patient will demonstrate the desired outcomes by discharge/transition of care.   Outcome: Ongoing (interventions implemented as appropriate)    Problem: Mobility, Physical Impaired (Adult)  Goal: Enhanced Mobility Skills  Patient will demonstrate the desired outcomes by discharge/transition of care.   Outcome: Ongoing (interventions implemented as appropriate)  Goal: Enhanced Functionality Ability  Patient will demonstrate the desired outcomes by discharge/transition of care.   Outcome: Ongoing (interventions implemented as appropriate)    Problem: Fluid Volume Excess (Adult,Obstetrics,Pediatric)  Goal: Stable Weight  Patient will demonstrate the desired outcomes by discharge/transition of care.   Outcome: Ongoing (interventions implemented as appropriate)  Goal: Balanced Intake/Output  Patient will demonstrate the desired outcomes by discharge/transition of care.   Outcome: Ongoing (interventions implemented as appropriate)    Problem: Pain, Acute (Adult)  Goal: Acceptable Pain Control/Comfort Level  Patient will  demonstrate the desired outcomes by discharge/transition of care.   Outcome: Ongoing (interventions implemented as appropriate)    Problem: Pressure Ulcer Risk (Braden Scale) (Adult,Obstetrics,Pediatric)  Goal: Skin Integrity  Patient will demonstrate the desired outcomes by discharge/transition of care.   Outcome: Ongoing (interventions implemented as appropriate)

## 2015-11-12 DIAGNOSIS — H409 Unspecified glaucoma: Secondary | ICD-10-CM

## 2015-11-12 DIAGNOSIS — H547 Unspecified visual loss: Secondary | ICD-10-CM

## 2015-11-12 DIAGNOSIS — Z751 Person awaiting admission to adequate facility elsewhere: Secondary | ICD-10-CM

## 2015-11-12 LAB — CBC WITH DIFFERENTIAL
BASOPHILS % AUTO: 0.9 %
BASOPHILS ABS AUTO: 0 10*3/uL (ref 0–0.2)
EOSINOPHIL % AUTO: 5.4 %
EOSINOPHIL ABS AUTO: 0.2 10*3/uL (ref 0–0.5)
HEMATOCRIT: 31.1 % — AB (ref 34–46)
HEMOGLOBIN: 9.5 g/dL — AB (ref 12.0–16.0)
LYMPHOCYTE ABS AUTO: 1.3 10*3/uL (ref 1.0–4.8)
LYMPHOCYTES % AUTO: 32.2 %
MCH: 21.6 pg — AB (ref 27–33)
MCHC: 30.6 % — AB (ref 32–36)
MCV: 70.7 UM3 — AB (ref 80–100)
MONOCYTES % AUTO: 6.4 %
MONOCYTES ABS AUTO: 0.3 10*3/uL (ref 0.1–0.8)
MPV: 7.1 UM3 (ref 6.8–10.0)
NEUTROPHIL ABS AUTO: 2.2 10*3/uL (ref 1.80–7.70)
NEUTROPHILS % AUTO: 55.1 %
PLATELET COUNT: 286 10*3/uL (ref 130–400)
RDW: 19.1 U — AB (ref 0–14.7)
RED CELL COUNT: 4.4 10*6/uL (ref 3.7–5.5)
WHITE BLOOD CELL COUNT: 4.1 10*3/uL — AB (ref 4.5–11.0)

## 2015-11-12 LAB — BASIC METABOLIC PANEL
CALCIUM: 8.6 mg/dL (ref 8.6–10.5)
CARBON DIOXIDE TOTAL: 21 meq/L — AB (ref 24–32)
CHLORIDE: 105 meq/L (ref 95–110)
CREATININE BLOOD: 4.01 mg/dL — AB (ref 0.44–1.27)
E-GFR, AFRICAN AMERICAN: 14 SEE NOTE — AB (ref >60)
E-GFR, NON-AFRICAN AMERICAN: 12 — AB (ref >60)
GLUCOSE: 160 mg/dL — AB (ref 70–99)
POTASSIUM: 4.6 meq/L (ref 3.3–5.0)
SODIUM: 137 meq/L (ref 135–145)
UREA NITROGEN, BLOOD (BUN): 67 mg/dL — AB (ref 8–22)

## 2015-11-12 LAB — PHOSPHORUS (PO4): PHOSPHORUS (PO4): 4.5 mg/dL (ref 2.4–5.0)

## 2015-11-12 LAB — MAGNESIUM (MG): MAGNESIUM (MG): 1.9 mg/dL (ref 1.5–2.6)

## 2015-11-12 LAB — INR: INR: 2.3 — AB (ref 0.87–1.18)

## 2015-11-12 MED ORDER — FUROSEMIDE 80 MG TABLET
80.0000 mg | ORAL_TABLET | Freq: Once | ORAL | Status: AC
Start: 2015-11-12 — End: 2015-11-12
  Administered 2015-11-12: 80 mg via ORAL
  Filled 2015-11-12: qty 1

## 2015-11-12 MED ORDER — WARFARIN 2 MG TABLET
6.0000 mg | ORAL_TABLET | ORAL | Status: AC
Start: 2015-11-12 — End: 2015-11-12
  Administered 2015-11-12: 6 mg via ORAL
  Filled 2015-11-12: qty 3

## 2015-11-12 NOTE — Nurse Assessment (Signed)
ASSESSMENT NOTE    Note Started: 11/12/2015, 02:29 for 11/11/15 2030       Assessment completed and recorded in EMR.  Plan of Care reviewed and appropriate, discussed with patient. Patient is AAOx3. No apparent distress noted. Call light within easy reach. Goals for the night-maintain safety and comfort      Joni FearsIan Jalayna Josten, RN, BSN

## 2015-11-12 NOTE — Progress Notes (Signed)
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 Patient notes right brown pain, possibly headache. No vision changes since last assessment. Currently receiving brimonidine and Maxitrol (no atropine drops available in hospital).    Interval Events:  Patient complaining of foreign body sensation and pain temporally in the right eye. No changes in vision    Exam    OD:   VA cc: counting fingers   IOP: 13    Bedside Anterior Segment Exam  L/L: normal  C/S: injection over sclerotomy sites w/ 2 exposed sutures  K: grossly clear  AC: formed  Iris: round, reactive    OS:   VA cc: 20/25 on near card  IOP: 21    Bedside Anterior Segment Exam  L/L: normal  C/S: white and quiet  K: grossly clear  AC: formed  Iris: round, reactive    Assessment: 50 y/o female s/p pars plana vitrectomy and endolaser OD for ghost cell glaucoma  - foreign body sensation and pain likely from sclerotomy suture sites  - pressures well controlled    Plan:  1. Continue brimonidine gtts both eyes TID  2. Continue to taper Maxitrol OD to BID x 2 more weeks  3. Ocular lubricant right eye QID for comfort  4. [facility] ([facility]Park [facility])    Sudie GrumblingMichael Mada Sadik, MD  PGY-2   [place] [facility] Dept of Ophthalmology  Service Pager: [phone]  Personal Pager: [phone]

## 2015-11-12 NOTE — Plan of Care (Signed)
Problem: Patient Care Overview (Adult)  Goal: Plan of Care Review  Outcome: Ongoing (interventions implemented as appropriate)  Goal Outcome Evaluation Note     Leslie Hernandez is a 89yr female admitted 10/20/2015      OUTCOME SUMMARY AND PLAN MOVING FORWARD:      VSS. NAC. Pt ambulating in hallway with walker today, pt tolerated well. Prn dilaudid for r-eye pain. Awaiting placement, will continue to monitor.         Goal: Individualization and Mutuality  Outcome: Ongoing (interventions implemented as appropriate)  Goal: Discharge Needs Assessment  Outcome: Ongoing (interventions implemented as appropriate)    Problem: Sleep Pattern Disturbance (Adult)  Goal: Adequate Sleep/Rest  Patient will demonstrate the desired outcomes by discharge/transition of care.   Outcome: Ongoing (interventions implemented as appropriate)    Problem: Fall Risk (Adult)  Goal: Absence of Falls  Patient will demonstrate the desired outcomes by discharge/transition of care.   Outcome: Ongoing (interventions implemented as appropriate)    Problem: Mobility, Physical Impaired (Adult)  Goal: Enhanced Mobility Skills  Patient will demonstrate the desired outcomes by discharge/transition of care.   Outcome: Ongoing (interventions implemented as appropriate)  Goal: Enhanced Functionality Ability  Patient will demonstrate the desired outcomes by discharge/transition of care.   Outcome: Ongoing (interventions implemented as appropriate)    Problem: Fluid Volume Excess (Adult,Obstetrics,Pediatric)  Goal: Stable Weight  Patient will demonstrate the desired outcomes by discharge/transition of care.   Outcome: Ongoing (interventions implemented as appropriate)  Goal: Balanced Intake/Output  Patient will demonstrate the desired outcomes by discharge/transition of care.   Outcome: Ongoing (interventions implemented as appropriate)    Problem: Pain, Acute (Adult)  Goal: Acceptable Pain Control/Comfort Level  Patient will demonstrate the desired outcomes by  discharge/transition of care.   Outcome: Ongoing (interventions implemented as appropriate)    Problem: Pressure Ulcer Risk (Braden Scale) (Adult,Obstetrics,Pediatric)  Goal: Skin Integrity  Patient will demonstrate the desired outcomes by discharge/transition of care.   Outcome: Ongoing (interventions implemented as appropriate)

## 2015-11-12 NOTE — Nurse Focus (Signed)
Pt inadvertently removed iv, pt refusing new iv at this time. MD aware.

## 2015-11-12 NOTE — Nurse Assessment (Signed)
ASSESSMENT NOTE    Note Started: 11/12/2015, 07:38     Initial assessment completed and recorded in EMR.  Report received from night shift nurse and orders reviewed. Patient reports 7/10 r-eye pain.  The plan for the day is pain management and rest.   Plan of Care reviewed and appropriate, discussed with patient.   Kari BaarsJoshua Loras Grieshop, RN.

## 2015-11-12 NOTE — Nurse Assessment (Signed)
ASSESSMENT NOTE    Note Started: 11/12/2015, 22:06     Initial assessment completed and recorded in EMR.  Report received from day shift nurse and orders reviewed. Plan of Care reviewed and appropriate, discussed with patient.  Doree BarthelEvelyn Correa, LVN Other (specify): LVN

## 2015-11-12 NOTE — Progress Notes (Signed)
INTERNAL MEDICINE DAILY PROGRESS NOTE  Date: 11/12/2015  Time:  15:15      Leslie Hernandez is a 50yo F with DM2, CKD4, PE on coumadin, dCHF, and HTN who presented 5/20 for progressive right eye vision loss 2/2 vitreal hemorrhage about to undergo vitrectomy but found to have supratherapeutic INR in PACU so sent to ED where she was found to have dyspnea, cough, rhinorrhea, nausea, and vomiting with an INR 4.87. She was found to have a Rhinovirus URI as well as decompensated CHF and subsequently admitted for further medical management.     24H INTERVAL HISTORY / SUBJECTIVE:  - awaiting placement  - unable to sleep as neighbor is too loud  - states she has worsening throbbing eye discomfort, feeling of grittiness in her R eye    MEDICATIONS:  Scheduled Medications  Acetaminophen (TYLENOL) Tablet 1,000 mg, ORAL, Q8H  Amlodipine (NORVASC) Tablet 10 mg, ORAL, QAM  Atorvastatin (LIPITOR) Tablet 80 mg, ORAL, Daily Bedtime  Brimonidine (ALPHAGAN) 0.2 % Ophthalmic Solution 1 drop, BOTH Eyes, TID  Calcium Acetate (PHOSLO) Capsule 1,334 mg, ORAL, TID w/ meals  Carvedilol (COREG) Tablet 25 mg, ORAL, BID w/ meals  Docusate (COLACE) Capsule 200 mg, ORAL, BID  FUROsemide (LASIX) Tablet 80 mg, ORAL, QAM  Gabapentin (NEURONTIN) Capsule 100 mg, ORAL, TID  HydrALAZINE (APRESOLINE) Tablet 100 mg, ORAL, TID  Insulin Aspart (NOVOLOG) Injection Pen 1-4 Units, SUBCUTANEOUS, Daily Bedtime  Insulin Aspart (NOVOLOG) Injection Pen 1-6 Units, SUBCUTANEOUS, TID w/ meals - correction dose  Insulin Glargine (LANTUS) Injection 8 Units, SUBCUTANEOUS, Daily Bedtime  Lactobacillus (CULTURELLE) Capsule 1 capsule, ORAL, QAM  Lidocaine (LIDODERM) 5 %(700 mg/patch) Patch 2 patch, Transdermal, Q24H Now  Lidocaine patch REMOVAL 1 patch, Transdermal, Q24H Now  Neomycin/Polymyxin/Dexamethasone (MAXITROL) Ophthalmic Ointment 0.5 inch, RIGHT Eye, Q12H Now  Polyethylene Glycol 3350 (MIRALAX) Oral Powder Packet 17 g, ORAL, QAM  Sennosides (SENOKOT) Tablet 17.2 mg, ORAL,  Daily Bedtime  Warfarin (COUMADIN) Tablet 6 mg, ORAL, Today x1  Warfarin Patient Flag, NOT APPLICABLE, PATIENT FLAG ONLY    IV Medications   PRN Medications  Albuterol (PROAIR HFA, PROVENTIL HFA, VENTOLIN HFA) Inhaler 1-2 puff, INHALATION, Q4H PRN  Bisacodyl (DULCOLAX) Suppository 10 mg, RECTALLY, Q24H PRN  Camphor/Menthol (SARNA) Lotion, TOPICAL, BID prn  Dextrose 50% Injection 12.5 g, IV, PRN  Dextrose 50% Injection 25 g, IV, PRN  Glucagon Injection 1 mg, IM, PRN  Guaifenesin 100mg -Dextromethorphan 10mg /305ml (ROBITUSSIN DM) Syrup 10 mL, ORAL, Q4H PRN  HydrALAZINE (APRESOLINE) Injection 10 mg, IV, Q4H PRN  Hydromorphone (DILAUDID) Tablet 2 mg, ORAL, Q4H PRN  Mineral Oil (FLEET) Enema 1 enema, RECTALLY, Bedtime PRN  Ondansetron (ZOFRAN) Injection 4 mg, IV, Q8H PRN  Promethazine (PHENERGAN) Injection 12.5 mg, IV, Q6H PRN      OBJECTIVE:   Vital Signs  Summary  Temp Min: 36.7 C (98 F) Max: 36.8 C (98.3 F)  BP: (135-165)/(76-82)   Pulse Min: 67 Max: 81  Resp Min: 16 Max: 18  SpO2 Min: 99 % Max: 100 %      Current Vitals  Temp: 36.8 C (98.3 F)  BP: 135/82  Pulse: 74  Resp: 18  SpO2: 100 %      Weight: 106.2 kg (234 lb 2.1 oz)     Intake and Output  Last Two Completed Shifts  In: 800 [Oral:800]  Out: 1450 [Urine:1450]    Current Shift  In: 400 [Oral:400]  Out: 0     Physical Exam    Physical Exam:  General: Middle  age black female, No acute distress, conversational. A+Ox3  HEENT: Moist oral mucosa. R eye chronic blindness, mild patchy erythema conjunctiva.Left pupil reactive to light.   CV: Regular rate and rhythm, nl s1s2, no murmurs, rubs or gallops. No definite JVD.  Pulm: Coarse BS 2/2 transmitted upper airway noises. No appreciable rales or wheeze.  Abd: Soft NT ND, +BS. No rebound or guarding  Skin: No rashes. No jaundice.  Neuro: face symmetric, moving all 4 extremities spontaneously.   Extrem: trace edema bilaterally    LINES AND DRAINS:    piv    LAB TESTS/STUDIES:   I personally reviewed  the following report(s)  .  Lab Results - 24 hours (excluding micro and POC)   BASIC METABOLIC PANEL     Status: Abnormal   Result Value Status    SODIUM 137 Final    POTASSIUM 4.6 Final    CHLORIDE 105 Final    CARBON DIOXIDE TOTAL 21 (L) Final    UREA NITROGEN, BLOOD (BUN) 67 (H) Final    CREATININE BLOOD 4.01 (H) Final    E-GFR, AFRICAN AMERICAN 14 (L) Final    E-GFR, NON-AFRICAN AMERICAN 12 (L) Final    GLUCOSE 160 (H) Final    CALCIUM 8.6 Final   MAGNESIUM (MG)     Status: None   Result Value Status    MAGNESIUM (MG) 1.9 Final   PHOSPHORUS (PO4)     Status: None   Result Value Status    PHOSPHORUS (PO4) 4.5 Final   INR     Status: Abnormal   Result Value Status    INR 2.30 (H) Final   CBC WITH DIFFERENTIAL     Status: Abnormal   Result Value Status    WHITE BLOOD CELL COUNT 4.1 (L) Final    RED CELL COUNT 4.40 Final    HEMOGLOBIN 9.5 (L) Final    HEMATOCRIT 31.1 (L) Final    MCV 70.7 (L) Final    MCH 21.6 (L) Final    MCHC 30.6 (L) Final    RDW 19.1 (H) Final    MPV 7.1 Final    PLATELET COUNT 286 Final    NEUTROPHILS % AUTO 55.1 Final    LYMPHOCYTES % AUTO 32.2 Final    MONOCYTES % AUTO 6.4 Final    EOSINOPHIL % AUTO 5.4 Final    BASOPHILS % AUTO 0.9 Final    NEUTROPHIL ABS AUTO 2.20 Final    LYMPHOCYTE ABS AUTO 1.3 Final    MONOCYTES ABS AUTO 0.3 Final    EOSINOPHIL ABS AUTO 0.2 Final    BASOPHILS ABS AUTO 0 Final     INFLUENZA A Negative  Negative  Final     INFLUENZA B Negative  Negative  Final    RESPIRATORY SYNCYTIAL VIRUS Negative  Negative  Final    PARAINFLUENZA Negative  Negative  Final    CORONAVIRUS Negative  Negative  Final    HUMAN METAPNEUMOVIRUS Negative  Negative  Final    RHINOVIRUS/ENTEROVIRUS POSITIVE (Abnl) Negative  Final    ADENOVIRUS Negative  Negative  Final    HUMAN BOCAVIRUS Negative  Negative  Final    CHLAMYDOPHILA PNEUMONIAE Negative  Negative  Final    MYCOPLASMA PNEUMONIAE Negative  Negative  Final    Comment:       Xray left foot:  Flattening and fragmentation of the  navicular bone, compatible with  subacute fracture. Regions of sclerosis within the navicular bone likely  reflect osteonecrosis.  Old healed fracture  of the proximal fifth metatarsal.    CXR 11/06/15  IMPRESSION:  STABLE CARDIOMEGALY. CONSIDER PERICARDIAL EFFUSION.  MILD INCREASE IN INTERSTITIAL MARKINGS, ESPECIALLY IN THE LEFT PERIHILAR  REGION AND LUNG BASE. THE FINDINGS ARE COMPATIBLE WITH PULMONARY EDEMA BUT A BILATERAL INFECTIOUS PROCESS COULD HAVE A SIMILAR APPEARANCE    ASSESSMENT & PLAN:      50 year old female past medical history of DM-2, CKD-4, PE on warfarin, dCHF, HTN with CHF exacerbation and subacute vitreal hemorrhage requiring ophtho retinal surgery which occurred but delayed due to supratherapeutic INR when admitted.    # Glaucoma  # Vitreal hemorrhage with increased IOP s/p urgent retinal surgery  # Diabetic retinopathy  # Right eye blindness  Transferred to [place] for urgent retinal surgery for elevated IOP but procedure delayed due to supratherapeutic INR and vitrious hemorrage. S/p vitrectomy and laser photocoagulatuion by ophthomology on 5/25.  - will ask opthalmology to revisit today/tomorrow, last note/eval on 6/2  - continue bromonidine gtt both eyes TID  - maxitrol R eye bid x 2 weeks (6/4-6/18)  - F/u 3 weeks as outpatient at [facility] or [place] per patient preference                          (schedule with [facility]. Willaim Bane at [facility] or Dr. Olevia Bowens at [facility])    # HTN  Persistently hypertensive. Already on hydralazine  TID, amlodipine  daily, and lasix  daily. Started on coreg, uptitrating  - Coreg 25 mg bid in addition to above    #AKI on CKD  # Acute on chronic heart failure, diastolic  Baseline Cr 2.3, presented with Cr 3.5 which rose with diuresis.  Was given IVF with minimal improvement. Cr stable 4.09 (peak 4.18) on discharge. Has symmetric 1-2+ pitting edema of lower extremities- improving. BNP improved from 5/20, thus unlikely 2/2 cardiorenal.  - Will attempt for better BP control,  uptitrating coreg.   - renally dose medications and avoid nephrotoxin  - continue lasix  po daily   - additional 80 mg PO x 1 this PM   - monitor response in Cr closely  - Fluid restriction, daily standing weight  - outpatient referral nephrology for management    # Anemia  Iron studies suggestive of anemia of chronic disease.  Hgb stable 8-9 while in house.    # Cough  # rhinorhea  Patient with rhinovirus but has been in-house for 16 days, no longer having significant secretions.   - continue guaifenisin cough syrup PRN  - d/c isolation precautions    # DM2  # Peripheral neuropathy  # Retinopathy  A1c 7.6% in May 2017. Gabapentin reduced to  TID given renal function.  Lantus added 6/1  - continue lantus 8u QHS + SSI with meals    # PE (saddle embolus) status post IVC filter  # supratherapeutic INR  The PE was diagnosed 3 years ago at [facility] with IVC placed with plan for lifelong AC given unprovoked and severity. Feb V/Q without evidence of PE. Was on coumadin  daily resulting in supratherapeutic INR on presentation here. Now with therapeutic INR.  DOAC n/a given worsening CKD.  - [facility] pharmacy to follow  - warfarin  x 1 today and on discharge, f/u coumadin clinic      # Left foot fracture  Recent Charcot's neuropathic osteoarthropathy left foot versus neuropathic fracture of the navicular bone and fifth metatarsal. Seen by ortho with worsening  osteonecrosis on xray.  - Patient must wear orthotics (CAM boot) boot when ambulating.   - Patient will need follow up ortho outpt ~1wk following discharge.    # FEN: renal/diabetic diet  # DVT Prophylaxis: Coumadin  # Code status: Full  # Dispo: Medically stable for discharge back to SNF    Report Electronically Signed By:    Kennith Maes, MD  Associate Physician - Internal Medicine  Section of [facility] Medicine  [facility]   Pager # [phone]  PI # [phone]

## 2015-11-12 NOTE — Allied Health Progress (Signed)
Intensive Case Management Follow Up Note    Comments: CM reviewed notes from weekend CM, and it appears pt [facility] but pt refused to go.  Since pt refused to go and [facility] could not secure any other SNF for pt, and thus per CM Mary's note, [facility] indicated [facility] are closing the case.  [facility] indicated to [employer] Mary that pt should be referred to a shelter or alternative placement and [facility] can set up f/u appts for pt, once placement secured.      CM left message with Gwendolyn FillLisa Santos from [facility] to determine if any interested room and boards located yet.  Misty StanleyLisa called back and informed CM she will assess the pt at bedside; hopefully today, and begin R & B search.          Plan:  1.  Seek Room and Board placement for pt.  [facility]' Gwendolyn FillLisa Santos, [phone], is assisting in search.      2.  [facility] closed case for SNF search, as pt refused to d/c to [facility].      3.  If pt to d/c to R &B LOC, then pt will need to be taught self care of foot ulcer &be provided with a FWW.    4.  If pt is d/c'd to R & B, then contact [facility] to coordinate outpatient care/ follow up for pt.        Date: 11/12/2015 Time: 08:07  Theotis BurrowKim Burnell Hurta, MSW Intensive Clinical Case Manager/ Discharge Planner  [phone], [phone]

## 2015-11-12 NOTE — Plan of Care (Signed)
Problem: Patient Care Overview (Adult)  Goal: Plan of Care Review  Outcome: Ongoing (interventions implemented as appropriate)  Goal Outcome Evaluation Note     Leslie Hernandez is a [age]yr female admitted 10/20/2015      OUTCOME SUMMARY AND PLAN MOVING FORWARD:      No acute changes noted overnight; slept intermittently. Dilaudid 2mg  given x2 this shift with good relief. Still awaiting placement at this time  Goal: Individualization and Mutuality  Outcome: Ongoing (interventions implemented as appropriate)  Goal: Discharge Needs Assessment  Outcome: Ongoing (interventions implemented as appropriate)    Problem: Sleep Pattern Disturbance (Adult)  Goal: Adequate Sleep/Rest  Patient will demonstrate the desired outcomes by discharge/transition of care.   Outcome: Ongoing (interventions implemented as appropriate)    Problem: Fall Risk (Adult)  Goal: Absence of Falls  Patient will demonstrate the desired outcomes by discharge/transition of care.   Outcome: Ongoing (interventions implemented as appropriate)    Problem: Mobility, Physical Impaired (Adult)  Goal: Enhanced Mobility Skills  Patient will demonstrate the desired outcomes by discharge/transition of care.   Outcome: Ongoing (interventions implemented as appropriate)  Goal: Enhanced Functionality Ability  Patient will demonstrate the desired outcomes by discharge/transition of care.   Outcome: Ongoing (interventions implemented as appropriate)    Problem: Fluid Volume Excess (Adult,Obstetrics,Pediatric)  Goal: Balanced Intake/Output  Patient will demonstrate the desired outcomes by discharge/transition of care.   Outcome: Ongoing (interventions implemented as appropriate)    Problem: Pain, Acute (Adult)  Goal: Acceptable Pain Control/Comfort Level  Patient will demonstrate the desired outcomes by discharge/transition of care.   Outcome: Ongoing (interventions implemented as appropriate)    Problem: Pressure Ulcer Risk (Braden Scale) (Adult,Obstetrics,Pediatric)  Goal:  Skin Integrity  Patient will demonstrate the desired outcomes by discharge/transition of care.   Outcome: Ongoing (interventions implemented as appropriate)

## 2015-11-13 ENCOUNTER — Inpatient Hospital Stay (HOSPITAL_COMMUNITY): Payer: MEDICAID

## 2015-11-13 DIAGNOSIS — R55 Syncope and collapse: Secondary | ICD-10-CM

## 2015-11-13 LAB — CBC WITH DIFFERENTIAL
BASOPHILS % AUTO: 0.9 %
BASOPHILS ABS AUTO: 0 10*3/uL (ref 0–0.2)
EOSINOPHIL % AUTO: 7.2 %
EOSINOPHIL ABS AUTO: 0.3 10*3/uL (ref 0–0.5)
HEMATOCRIT: 31.2 % — AB (ref 34–46)
HEMOGLOBIN: 9.8 g/dL — AB (ref 12.0–16.0)
LYMPHOCYTE ABS AUTO: 1.5 10*3/uL (ref 1.0–4.8)
LYMPHOCYTES % AUTO: 37.7 %
MCH: 22 pg — AB (ref 27–33)
MCHC: 31.5 % — AB (ref 32–36)
MCV: 69.8 UM3 — AB (ref 80–100)
MONOCYTES % AUTO: 6.1 %
MONOCYTES ABS AUTO: 0.2 10*3/uL (ref 0.1–0.8)
MPV: 7.1 UM3 (ref 6.8–10.0)
NEUTROPHIL ABS AUTO: 1.9 10*3/uL (ref 1.80–7.70)
NEUTROPHILS % AUTO: 48.1 %
PLATELET COUNT: 333 10*3/uL (ref 130–400)
PLATELET ESTIMATE, SMEAR: ADEQUATE
RDW: 19.1 U — AB (ref 0–14.7)
RED CELL COUNT: 4.47 10*6/uL (ref 3.7–5.5)
WHITE BLOOD CELL COUNT: 4 10*3/uL — AB (ref 4.5–11.0)

## 2015-11-13 LAB — BASIC METABOLIC PANEL
CALCIUM: 8.9 mg/dL (ref 8.6–10.5)
CARBON DIOXIDE TOTAL: 23 meq/L — AB (ref 24–32)
CHLORIDE: 106 meq/L (ref 95–110)
CREATININE BLOOD: 3.99 mg/dL — AB (ref 0.44–1.27)
E-GFR, AFRICAN AMERICAN: 14 SEE NOTE — AB (ref >60)
E-GFR, NON-AFRICAN AMERICAN: 13 — AB (ref >60)
GLUCOSE: 168 mg/dL — AB (ref 70–99)
POTASSIUM: 4.3 meq/L (ref 3.3–5.0)
SODIUM: 137 meq/L (ref 135–145)
UREA NITROGEN, BLOOD (BUN): 63 mg/dL — AB (ref 8–22)

## 2015-11-13 LAB — PHOSPHORUS (PO4): PHOSPHORUS (PO4): 4.3 mg/dL (ref 2.4–5.0)

## 2015-11-13 LAB — MAGNESIUM (MG): MAGNESIUM (MG): 1.9 mg/dL (ref 1.5–2.6)

## 2015-11-13 LAB — INR: INR: 2.41 — AB (ref 0.87–1.18)

## 2015-11-13 MED ORDER — WARFARIN 2 MG TABLET
6.0000 mg | ORAL_TABLET | ORAL | Status: AC
Start: 2015-11-13 — End: 2015-11-13
  Administered 2015-11-13: 6 mg via ORAL
  Filled 2015-11-13: qty 3

## 2015-11-13 MED ORDER — WHITE PETROLATUM-MINERAL OIL 83 %-15 % EYE OINTMENT
0.2500 [in_us] | TOPICAL_OINTMENT | Freq: Four times a day (QID) | OPHTHALMIC | Status: DC | PRN
Start: 2015-11-13 — End: 2015-11-18
  Filled 2015-11-13: qty 3.5

## 2015-11-13 NOTE — Allied Health Progress (Signed)
Intensive Case Management Follow Up Note    Comments: CM received call from Cathlyn Parsons, [facility] Social Work Case Manager, ph: [phone], late on 6/12, who follows and has followed pt for years in the outpatient setting.  Per Andee Poles, she just learned pt was at [facility], as she didn't know where pt was for the last several weeks.  She stated she follows pt for all of pt's outpatient medical needs/ medical follow up appts/ etc.  Per Andee Poles, pt has had [facility] on and off for years, and pt has been intermittently switched to [employer] (when pt moved to [place] to live with daughter in the past) to straight [employer], back to [employer] cabin [employer].  Per Andee Poles, she can set up any services needed for pt, including transport to and from medical appts, outpatient services such as [place] or outpatient PT/OT, DME, etc.  Per Andee Poles, she stated once room and board secured to let her know, and then she can set up anything that is needed for pt.  CM asked her to kindly secure FWW and HH (for PT and wound care for foot ulcer), once CM secures a R & B.  CM hoping to secure R & B tomorrow.  Also, per Andee Poles, pt can get "quite upset" at folks, service providers, and can become difficult to work with.  She stated she has followed pt for years in outpatient setting and thus is familiar with the pt and her behaviors/ patterns/ issues/ med hx.  CM will be in touch.     CM spoke with Alben Spittle from [place], late yesterday, 6/12, who relayed she likely will be able to secure a R & B for pt 6/13.  She will be in touch.  She stated it appears pt will need a FWW and possibly HH.  CM can arrange, once cm has address.  Pt has [employer] and a PCP that follows her.  Per Lattie Haw, she met with pt and pt is agreeable to R & B placement.      CM left vm with Danielle at [facility] to update her that it is likely cm will secure R & B for pt today, so CM asked Danielle to help with [facility]  and FWW, once CM has the address.       CM spoke with Charge RN and bedside RN; pt apparently had an episode this date with nearly passing out.  Pt's vitals normal and blood sugar slightly elevated but not enough to explain nearly passing out.  MD apprised of the episode.  CM asked bedside RN if she could kindly try to teach pt the wound care of foot ulcer, as per bedside RN last week, pt amenable and teachable to self wound care, if given direction.  Per Bedside RN, she will teach pt wound care.  CM will keep bedside RN informed if R & B located for pt and if [facility] responds with [place], DME and medical f/u being secured.      Spoke w/ Dr. Ferrel Logan, and pt has episode of syncope this am.  MD monitoring.  CM will alert MD if R & B secured for pt to determine if pt will d/c today vs alternative date following syncope monitoring.      CM informed from Alben Spittle that the R & B can accept pt today; however, cm informed Lattie Haw of what occurred this morning and pt needing work up.  She stated to let her  know if pt will d/c today vs tomorrow or next day.      CM spoke with Dr. Ferrel Logan, who reported he will keep pt overnight; he will work pt up for syncope episode.  CM informed MD that R & B has accepted the pt, and thus if pt is ok overnight and Ivar Bury can secure f/u, then pt could potentially go tomorrow.  CM will discuss in the morning.      CM spoke with Andee Poles at [facility], who was informed there is an accepting R & B; however, pt presented with syncope episode this am and thus now won't leave today.  Per Andee Poles, this is pt's behavior--she will have services and/or follow up or placement secured and she will sabotage the services/ placement as it is happening.  Per Andee Poles, long patter of pt doing this same thing time and time again.  Per Andee Poles, if pt is to leave tomorrow, pt's RN CM, Lucianne Lei, ph: [phone], will try to coordinate FWW and [facility] for pt, but it will have to go through pt's PCP.  CM will be in touch with her once  d/c date is confirmed, along with address for R & B.      CM spoke at length with Alben Spittle from [place], who secured the R & B:   -- [address], [place], [place] [address] with operator Elbert Ewings.    Per Lattie Haw she believes the pt is not being forthcoming about many things; however, R & B is still willing to take the pt.  She stated the pt first told her yesterday she has 980 month in income and had that much money in the bank; then changed her tune and stated she has about 740 in her account.  Pt stated she has been to several R & B's, including [facility] and she is refusing to go to many of the R & B's, including [facility]. Pt had a list of places she will not go to; seemed to present as very picky.  Lattie Haw stated she is fine to work with all of this, but at same time can't save the R & B for more than a day, as the bed will be lost and as it is pt doesn't have enough $ to even pay first month's rent, but R & B operator still willing to take her.  This R & B is quite nice, brand new, pt will have a roommate that is also AA female, pt will have meals prepared for her that are conducive to diabetic diet, and R & B operator can help get pt to her f/u appts and make sure pt takes her meds.  Per Lattie Haw, she is aware the pt likely has issues and that is why she is needs R & B even though pt is only 50 yo, but with that said, still willing and able to place pt in the R & B tomorrow, if pt ready.  She also stated [facility] is able to take this pt, even though pt doesn't have the money for the placement this month.  Pt only has portion of funds.  She stated she will f/u with pt now to confirm pt is ok with going and see if this can get arranged for tomorrow.  CM will f/u with Lattie Haw tomorrow then to let her know if d/c will be tomorrow.          Plan:  1.  Await to confirm with MD  that pt is stable to d/c.      2.  Confirm with RN that pt is has been taught wound care to foot ulcer.    3.  If pt  can d/c tomorrow, pt has an accepting R & B, [address] [place], [place], [place] [address].  [place], [phone] to arrange placement.     4.  If pt d/c's tomorrow, update [facility] [place], [place], [phone], and pt's RN CM for outpatient svs, Lucianne Lei, [phone].        Date: 11/13/2015 Time: 08:05  Christy Gentles, MSW Intensive Clinical Case Manager/ Discharge Planner  [phone], [phone]

## 2015-11-13 NOTE — Nurse Focus (Addendum)
Patient expresses concern about plans for discharge and discharge plans. Patient expresses that she does not want to go to an "unlicensed place" and that she will not pay $840 "for just a mattress in a shared room". Patient would like to speak to case manager first thing in the morning to discuss. Will follow up with morning discharge team in AM.

## 2015-11-13 NOTE — Progress Notes (Signed)
INTERNAL MEDICINE DAILY PROGRESS NOTE  Date: 11/13/2015  Time:  18:05      Leslie Hernandez is a 50yo F with DM2, CKD4, PE on coumadin, dCHF, and HTN who presented 5/20 for progressive right eye vision loss 2/2 vitreal hemorrhage about to undergo vitrectomy but found to have supratherapeutic INR in PACU so sent to ED where she was found to have dyspnea, cough, rhinorrhea, nausea, and vomiting with an INR 4.87. She was found to have a Rhinovirus URI as well as decompensated CHF and subsequently admitted for further medical management.     24H INTERVAL HISTORY / SUBJECTIVE:  - was doing well this morning  - syncopal episode while ambulating in the hallway, symptoms of generalized weakness quickly resolved  - still anxious regarding safe placement  - unable to sleep as neighbor is too loud    MEDICATIONS:  Scheduled Medications  Acetaminophen (TYLENOL) Tablet 1,000 mg, ORAL, Q8H  Amlodipine (NORVASC) Tablet 10 mg, ORAL, QAM  Atorvastatin (LIPITOR) Tablet 80 mg, ORAL, Daily Bedtime  Brimonidine (ALPHAGAN) 0.2 % Ophthalmic Solution 1 drop, BOTH Eyes, TID  Calcium Acetate (PHOSLO) Capsule 1,334 mg, ORAL, TID w/ meals  Carvedilol (COREG) Tablet 25 mg, ORAL, BID w/ meals  Docusate (COLACE) Capsule 200 mg, ORAL, BID  FUROsemide (LASIX) Tablet 80 mg, ORAL, QAM  Gabapentin (NEURONTIN) Capsule 100 mg, ORAL, TID  HydrALAZINE (APRESOLINE) Tablet 100 mg, ORAL, TID  Insulin Aspart (NOVOLOG) Injection Pen 1-4 Units, SUBCUTANEOUS, Daily Bedtime  Insulin Aspart (NOVOLOG) Injection Pen 1-6 Units, SUBCUTANEOUS, TID w/ meals - correction dose  Insulin Glargine (LANTUS) Injection 8 Units, SUBCUTANEOUS, Daily Bedtime  Lactobacillus (CULTURELLE) Capsule 1 capsule, ORAL, QAM  Lidocaine (LIDODERM) 5 %(700 mg/patch) Patch 2 patch, Transdermal, Q24H Now  Lidocaine patch REMOVAL 1 patch, Transdermal, Q24H Now  Neomycin/Polymyxin/Dexamethasone (MAXITROL) Ophthalmic Ointment 0.5 inch, RIGHT Eye, Q12H Now  Polyethylene Glycol 3350 (MIRALAX) Oral Powder  Packet 17 g, ORAL, QAM  Sennosides (SENOKOT) Tablet 17.2 mg, ORAL, Daily Bedtime  Warfarin Patient Flag, NOT APPLICABLE, PATIENT FLAG ONLY    IV Medications   PRN Medications  Albuterol (PROAIR HFA, PROVENTIL HFA, VENTOLIN HFA) Inhaler 1-2 puff, INHALATION, Q4H PRN  Bisacodyl (DULCOLAX) Suppository 10 mg, RECTALLY, Q24H PRN  Camphor/Menthol (SARNA) Lotion, TOPICAL, BID prn  Dextrose 50% Injection 12.5 g, IV, PRN  Dextrose 50% Injection 25 g, IV, PRN  Glucagon Injection 1 mg, IM, PRN  Guaifenesin -Dextromethorphan /38ml (ROBITUSSIN DM) Syrup 10 mL, ORAL, Q4H PRN  HydrALAZINE (APRESOLINE) Injection 10 mg, IV, Q4H PRN  Hydromorphone (DILAUDID) Tablet 2 mg, ORAL, Q4H PRN  Mineral Oil (FLEET) Enema 1 enema, RECTALLY, Bedtime PRN  Ocular Lubricant Ophthalmic Ointment 0.25 inch, RIGHT Eye, Q6H PRN  Ondansetron (ZOFRAN) Injection 4 mg, IV, Q8H PRN  Promethazine (PHENERGAN) Injection 12.5 mg, IV, Q6H PRN      OBJECTIVE:   Vital Signs  Summary  Temp Min: 36.6 C (97.8 F) Max: 37.1 C (98.7 F)  BP: (136-157)/(46-84)   Pulse Min: 63 Max: 75  Resp Min: 18 Max: 18  SpO2 Min: 98 % Max: 99 %      Current Vitals  Temp: 37.1 C (98.7 F)  BP: 157/84  Pulse: 75  Resp: 18  SpO2: 99 %      Weight: 106.2 kg (234 lb 2.1 oz)     Intake and Output  Last Two Completed Shifts  In: 400 [Oral:400]  Out: 1800 [Urine:1800]    Current Shift  In: 270 [Oral:270]  Out: 1000 [Urine:1000]  Physical Exam    Physical Exam:  General: Middle age black female, No acute distress, conversational. A+Ox3  HEENT: Moist oral mucosa. R eye chronic blindness, mild patchy erythema conjunctiva.Left pupil reactive to light.   CV: Regular rate and rhythm, nl s1s2, no murmurs, rubs or gallops. No definite JVD.  Pulm: Coarse BS 2/2 transmitted upper airway noises. No appreciable rales or wheeze.  Abd: Soft NT ND, +BS. No rebound or guarding  Skin: No rashes. No jaundice.  Neuro: face symmetric, moving all 4 extremities spontaneously.    CN  II-XII intact, intact finger-nose, heel shin no vertical nystagmus  Extrem: trace edema bilaterally    LINES AND DRAINS:    piv    LAB TESTS/STUDIES:   I personally reviewed the following report(s)  .  Lab Results - 24 hours (excluding micro and POC)   BASIC METABOLIC PANEL     Status: Abnormal   Result Value Status    SODIUM 137 Final    POTASSIUM 4.3 Final    CHLORIDE 106 Final    CARBON DIOXIDE TOTAL 23 (L) Final    UREA NITROGEN, BLOOD (BUN) 63 (H) Final    CREATININE BLOOD 3.99 (H) Final    E-GFR, AFRICAN AMERICAN 14 (L) Final    E-GFR, NON-AFRICAN AMERICAN 13 (L) Final    GLUCOSE 168 (H) Final    CALCIUM 8.9 Final   MAGNESIUM (MG)     Status: None   Result Value Status    MAGNESIUM (MG) 1.9 Final   PHOSPHORUS (PO4)     Status: None   Result Value Status    PHOSPHORUS (PO4) 4.3 Final   INR     Status: Abnormal   Result Value Status    INR 2.41 (H) Final   CBC WITH DIFFERENTIAL     Status: Abnormal   Result Value Status    WHITE BLOOD CELL COUNT 4.0 (L) Final    RED CELL COUNT 4.47 Final    HEMOGLOBIN 9.8 (L) Final    HEMATOCRIT 31.2 (L) Final    MCV 69.8 (L) Final    MCH 22.0 (L) Final    MCHC 31.5 (L) Final    RDW 19.1 (H) Final    MPV 7.1 Final    PLATELET COUNT 333 Final    NEUTROPHILS % AUTO 48.1 Final    LYMPHOCYTES % AUTO 37.7 Final    MONOCYTES % AUTO 6.1 Final    EOSINOPHIL % AUTO 7.2 Final    BASOPHILS % AUTO 0.9 Final    NEUTROPHIL ABS AUTO 1.90 Final    LYMPHOCYTE ABS AUTO 1.5 Final    MONOCYTES ABS AUTO 0.2 Final    EOSINOPHIL ABS AUTO 0.3 Final    BASOPHILS ABS AUTO 0 Final    ANISOCYTOSIS SL (Abnl) Final    LEFT SHIFT NOT PRESENT Final    PLATELET ESTIMATE, SMEAR Adequate Final     INFLUENZA A Negative  Negative  Final     INFLUENZA B Negative  Negative  Final    RESPIRATORY SYNCYTIAL VIRUS Negative  Negative  Final    PARAINFLUENZA Negative  Negative  Final    CORONAVIRUS Negative  Negative  Final    HUMAN METAPNEUMOVIRUS Negative  Negative  Final    RHINOVIRUS/ENTEROVIRUS POSITIVE (Abnl)  Negative  Final    ADENOVIRUS Negative  Negative  Final    HUMAN BOCAVIRUS Negative  Negative  Final    CHLAMYDOPHILA PNEUMONIAE Negative  Negative  Final    MYCOPLASMA PNEUMONIAE Negative  Negative  Final    Comment:       Xray left foot:  Flattening and fragmentation of the navicular bone, compatible with  subacute fracture. Regions of sclerosis within the navicular bone likely  reflect osteonecrosis.  Old healed fracture of the proximal fifth metatarsal.    CXR 11/06/15  IMPRESSION:  STABLE CARDIOMEGALY. CONSIDER PERICARDIAL EFFUSION.  MILD INCREASE IN INTERSTITIAL MARKINGS, ESPECIALLY IN THE LEFT PERIHILAR  REGION AND LUNG BASE. THE FINDINGS ARE COMPATIBLE WITH PULMONARY EDEMA BUT A BILATERAL INFECTIOUS PROCESS COULD HAVE A SIMILAR APPEARANCE    ASSESSMENT & PLAN:      50 year old female past medical history of DM-2, CKD-4, PE on warfarin, dCHF, HTN with CHF exacerbation and subacute vitreal hemorrhage requiring ophtho retinal surgery which occurred but delayed due to supratherapeutic INR when admitted.    # Syncope  Syncopal episode today 6/13 while ambulating in the hallways.  On exam with generalized diffuse weakness non-focal exam, glucose normal and vital signs remained normal.  CT Head negative for acute trauma.  Although there is suspicion from members of caregiving team that there may be secondary gain as similar episodes have occurred upon discussion of placement options, her rapid recovery is also consistent with syncope and did receive 2 doses of PO lasix last night.  - declined orthostatic evaluation  - ECG, telemetry monitoring  - has recent echo from 5/21    # Glaucoma  # Vitreal hemorrhage with increased IOP s/p urgent retinal surgery  # Diabetic retinopathy  # Right eye blindness  Transferred to [place] for urgent retinal surgery for elevated IOP but procedure delayed due to supratherapeutic INR and vitrious hemorrage. S/p vitrectomy and laser photocoagulatuion by [facility]hthom[facility] on 5/25.  - will  ask opthalmology to revisit today/tomorrow, last note/eval on 6/2  - continue bromonidine gtt both eyes TID  - maxitrol R eye bid x 2 weeks (6/4-6/18)  - F/u 3 weeks as outpatient at [facility] or [place] per patient preference                          (schedule with Dr. Willaim Bane at [facility] or Dr. Olevia Bowens at [facility])    # HTN  Persistently hypertensive. Already on hydralazine 100mg  TID, amlodipine 10mg  daily, and lasix 80mg  daily. Started on coreg, uptitrating  - Coreg 25 mg bid in addition to above    #AKI on CKD  # Acute on chronic heart failure, diastolic  Baseline Cr 2.3, presented with Cr 3.5 which rose with diuresis.  Was given IVF with minimal improvement. Cr stable 4.09 (peak 4.18) on discharge. Has symmetric 1-2+ pitting edema of lower extremities- improving. BNP improved from 5/20, thus unlikely 2/2 cardiorenal.  - Will attempt for better BP control, uptitrating coreg.   - renally dose medications and avoid nephrotoxin  - continue lasix 80mg  po daily   - monitor response in Cr closely  - Fluid restriction, daily standing weight  - outpatient referral nephrology for management    # Anemia  Iron studies suggestive of anemia of chronic disease.  Hgb stable 8-9 while in house.    # Cough  # rhinorhea  Patient with rhinovirus but has been in-house for 16 days, no longer having significant secretions.   - continue guaifenisin cough syrup PRN  - d/c isolation precautions    # DM2  # Peripheral neuropathy  # Retinopathy  A1c 7.6% in May 2017. Gabapentin reduced to 100mg  TID given renal  function.  Lantus added 6/1  - continue lantus 8u QHS + SSI with meals    # PE (saddle embolus) status post IVC filter  # supratherapeutic INR  The PE was diagnosed 3 years ago at [facility] with IVC placed with plan for lifelong AC given unprovoked and severity. Feb V/Q without evidence of PE. Was on coumadin 20mg  daily resulting in supratherapeutic INR on presentation here. Now with therapeutic INR.  DOAC n/a given worsening  CKD.  - [facility] pharmacy to follow  - warfarin 6mg  x 1 today and on discharge, f/u coumadin clinic      # Left foot fracture  Recent Charcot's neuropathic osteoarthropathy left foot versus neuropathic fracture of the navicular bone and fifth metatarsal. Seen by ortho with worsening osteonecrosis on xray.  - Patient must wear orthotics (CAM boot) boot when ambulating.   - Patient will need follow up ortho outpt ~1wk following discharge.    # FEN: renal/diabetic diet  # DVT Prophylaxis: Coumadin  # Code status: Full  # Dispo: Medically stable for discharge back to SNF if remains stable overnight on telemetry    Report Electronically Signed By:    Kennith Maes, MD  Associate Physician - Internal Medicine  Section of [facility] Medicine  [facility]   Pager # [phone]  PI # [id number]

## 2015-11-13 NOTE — Plan of Care (Addendum)
Problem: Patient Care Overview (Adult)  Goal: Plan of Care Review  Outcome: Ongoing (interventions implemented as appropriate)  Goal Outcome Evaluation Note     Leslie Hernandez is a [age]yr female admitted 10/20/2015      OUTCOME SUMMARY AND PLAN MOVING FORWARD:   Patient with syncopal episode while ambulating, stable vitals, CT head negative, EKG consistent with baseline, patient recovered without any complications. Patient initiated on telemetry. Refusing orthostatic vitals today, MD aware. Patient has expressed concern about having to go to a board and care d/t having to share a room with an unfamiliar roommate. Discharge planner and placement coordinator are aware. Plan is for board and care placement when stable.       Goal: Individualization and Mutuality  Outcome: Ongoing (interventions implemented as appropriate)  Goal: Discharge Needs Assessment  Outcome: Ongoing (interventions implemented as appropriate)    Problem: Sleep Pattern Disturbance (Adult)  Goal: Adequate Sleep/Rest  Patient will demonstrate the desired outcomes by discharge/transition of care.   Outcome: Ongoing (interventions implemented as appropriate)    Problem: Fall Risk (Adult)  Goal: Absence of Falls  Patient will demonstrate the desired outcomes by discharge/transition of care.   Outcome: Ongoing (interventions implemented as appropriate)    Problem: Mobility, Physical Impaired (Adult)  Goal: Enhanced Mobility Skills  Patient will demonstrate the desired outcomes by discharge/transition of care.   Outcome: Ongoing (interventions implemented as appropriate)  Goal: Enhanced Functionality Ability  Patient will demonstrate the desired outcomes by discharge/transition of care.   Outcome: Ongoing (interventions implemented as appropriate)    Problem: Fluid Volume Excess (Adult,Obstetrics,Pediatric)  Goal: Stable Weight  Patient will demonstrate the desired outcomes by discharge/transition of care.   Outcome: Ongoing (interventions implemented as  appropriate)  Goal: Balanced Intake/Output  Patient will demonstrate the desired outcomes by discharge/transition of care.   Outcome: Ongoing (interventions implemented as appropriate)    Problem: Pain, Acute (Adult)  Goal: Acceptable Pain Control/Comfort Level  Patient will demonstrate the desired outcomes by discharge/transition of care.   Outcome: Ongoing (interventions implemented as appropriate)    Problem: Pressure Ulcer Risk (Braden Scale) (Adult,Obstetrics,Pediatric)  Goal: Skin Integrity  Patient will demonstrate the desired outcomes by discharge/transition of care.   Outcome: Ongoing (interventions implemented as appropriate)

## 2015-11-13 NOTE — Allied Health Progress (Signed)
Date of Service: 11/13/2015  Time in: 1010  Total time: 15 Minutes    S: patient see with RN walking hallway.  Patient "near fainting" episode in wall. Patient  Assisted to chair and vitals per Rn were wfl.  Patient  reguained consciously and was transferred to Manual wheeled chair via stand pivot transfer by  this OT Min A .  Patient  wheeled to room and subsequently  Stand pivot transferred to bed, Min A with  Patient positioned for comfort and all lines and phone in reach.  Patient care transferred back to RN      A: patient had a near fainting episode this morning with RN but able to stand pivot to bed.   Patient would benefit from OT intervention to address pending deficit areas in order to prevent further complications and restore patient  to or near prior level of function with necessary strategies and adaptations while still in the hospital    P:  Continue OT 3-5x/wk to address functional Independence. Focus on: transre training, ADLs Independence,  and reinforce safety, ADLs re training, functional transfer compliance and progression of function towards stated goals.     Reported by:   Arma HeadingBright Mette Southgate, OTR/L II,    Dept. of Physical Medicine & Rehabilitation (Acute Care Services)   Occupational Therapies  PI #: [id number]  Vocera: [id number]  Phone: [phone]

## 2015-11-13 NOTE — Nurse Assessment (Signed)
ASSESSMENT NOTE    Note Started: 11/13/2015, 07:32     Initial assessment completed and recorded in EMR.  Report received from night shift nurse and orders reviewed. Patient sitting up in chair, having breakfast.  Plan of Care reviewed and appropriate, discussed with patient.  Charlett Langoanh Lurene Robley, RN

## 2015-11-13 NOTE — Nurse Focus (Addendum)
Patient was ambulating from chair to the hallway with FWW with this RN. Patient was alert, oriented, and maintaining conversation with RN. Patient suddenly became appeared lethargic and this RN guided patient into chair. Patient became nonresponsive while in chair to verbal/painful stimuli. Patient with VSS, BS of 208, pt. then slowly became alert but still lethargic and disoriented to situation, does not recollect what happened after ambulating. Patient escorted back to bed. Denies pain or SOB, just reports feeling very tired. MD at bedside to assess, patient to go to CT stat.     1115: Patient back from CT, resting in bed, now appears to be at baseline. Patient with normal mentation, oriented to time and situation. Still slightly lethargic.

## 2015-11-13 NOTE — Nurse Assessment (Addendum)
ASSESSMENT NOTE    Note Started: 11/13/2015, 20:30     Initial assessment completed.  Report received from day shift nurse and orders reviewed. Plan of Care reviewed and appropriate, discussed with patient. Patient sleepy at this time. VSS but patient reporting nausea. Will administer antiemetics and monitor response. Jomarie LongsKatherine Cyana Shook, RN

## 2015-11-13 NOTE — Plan of Care (Signed)
Problem: Patient Care Overview (Adult)  Goal: Plan of Care Review  Outcome: Ongoing (interventions implemented as appropriate)    11/13/15 0420   OTHER   Plan Of Care Reviewed With patient   Plan of Care Review   Progress progress toward functional goals as expected   Goal Outcome Evaluation Note     Leslie Hernandez is a [age]yr female admitted 10/20/2015      OUTCOME SUMMARY AND PLAN MOVING FORWARD:   Pt has had a sleepless night, due to her roommate and has been sleeping off and on throughout the night, she then later has voiced her feelings on her placement, she doesn't want to be in a b&c that she would have to share a room or bathroom, but has otherwise had an uneventful night with no acute changes noted Doree BarthelEvelyn Correa, LVN        Problem: Sleep Pattern Disturbance (Adult)  Goal: Adequate Sleep/Rest  Patient will demonstrate the desired outcomes by discharge/transition of care.   Outcome: Ongoing (interventions implemented as appropriate)    Problem: Fall Risk (Adult)  Goal: Absence of Falls  Patient will demonstrate the desired outcomes by discharge/transition of care.   Outcome: Ongoing (interventions implemented as appropriate)    Problem: Mobility, Physical Impaired (Adult)  Goal: Enhanced Mobility Skills  Patient will demonstrate the desired outcomes by discharge/transition of care.   Outcome: Ongoing (interventions implemented as appropriate)  Goal: Enhanced Functionality Ability  Patient will demonstrate the desired outcomes by discharge/transition of care.   Outcome: Ongoing (interventions implemented as appropriate)    Problem: Fluid Volume Excess (Adult,Obstetrics,Pediatric)  Goal: Stable Weight  Patient will demonstrate the desired outcomes by discharge/transition of care.   Outcome: Ongoing (interventions implemented as appropriate)  Goal: Balanced Intake/Output  Patient will demonstrate the desired outcomes by discharge/transition of care.   Outcome: Ongoing (interventions implemented as  appropriate)    Problem: Pain, Acute (Adult)  Goal: Acceptable Pain Control/Comfort Level  Patient will demonstrate the desired outcomes by discharge/transition of care.   Outcome: Ongoing (interventions implemented as appropriate)    Problem: Pressure Ulcer Risk (Braden Scale) (Adult,Obstetrics,Pediatric)  Goal: Skin Integrity  Patient will demonstrate the desired outcomes by discharge/transition of care.   Outcome: Ongoing (interventions implemented as appropriate)

## 2015-11-14 LAB — BASIC METABOLIC PANEL
CALCIUM: 8.7 mg/dL (ref 8.6–10.5)
CARBON DIOXIDE TOTAL: 24 meq/L (ref 24–32)
CHLORIDE: 107 meq/L (ref 95–110)
CREATININE BLOOD: 3.94 mg/dL — AB (ref 0.44–1.27)
E-GFR, AFRICAN AMERICAN: 15 SEE NOTE — AB (ref >60)
E-GFR, NON-AFRICAN AMERICAN: 13 — AB (ref >60)
GLUCOSE: 133 mg/dL — AB (ref 70–99)
POTASSIUM: 4.7 meq/L (ref 3.3–5.0)
SODIUM: 138 meq/L (ref 135–145)
UREA NITROGEN, BLOOD (BUN): 64 mg/dL — AB (ref 8–22)

## 2015-11-14 LAB — URINALYSIS AND CULTURE IF IND
BILIRUBIN URINE: NEGATIVE
GLUCOSE URINE: 500 mg/dL
KETONES: NEGATIVE mg/dL
LEUK. ESTERASE: NEGATIVE
NITRITE URINE: NEGATIVE
OCCULT BLOOD URINE: NEGATIVE mg/dL
PH URINE: 6.5 (ref 4.8–7.8)
PROTEIN URINE: 300 mg/dL — AB
SPECIFIC GRAVITY: 1.012 (ref 1.002–1.030)
SQUAMOUS EPI: 3 /HPF (ref 0–5)
UROBILINOGEN.: NEGATIVE mg/dL (ref ?–2.0)
WBC: 3 /HPF (ref 0–5)

## 2015-11-14 LAB — CBC WITH DIFFERENTIAL
BASOPHILS % AUTO: 0.8 %
BASOPHILS ABS AUTO: 0 10*3/uL (ref 0–0.2)
EOSINOPHIL % AUTO: 6.8 %
EOSINOPHIL ABS AUTO: 0.3 10*3/uL (ref 0–0.5)
HEMATOCRIT: 30 % — AB (ref 34–46)
HEMOGLOBIN: 9.2 g/dL — AB (ref 12.0–16.0)
LYMPHOCYTE ABS AUTO: 1.6 10*3/uL (ref 1.0–4.8)
LYMPHOCYTES % AUTO: 41 %
MCH: 21.6 pg — AB (ref 27–33)
MCHC: 30.6 % — AB (ref 32–36)
MCV: 70.4 UM3 — AB (ref 80–100)
MONOCYTES % AUTO: 7.7 %
MONOCYTES ABS AUTO: 0.3 10*3/uL (ref 0.1–0.8)
MPV: 6.9 UM3 (ref 6.8–10.0)
NEUTROPHIL ABS AUTO: 1.7 10*3/uL — AB (ref 1.80–7.70)
NEUTROPHILS % AUTO: 43.7 %
PLATELET COUNT: 276 10*3/uL (ref 130–400)
RDW: 19.4 U — AB (ref 0–14.7)
RED CELL COUNT: 4.26 10*6/uL (ref 3.7–5.5)
WHITE BLOOD CELL COUNT: 3.9 10*3/uL — AB (ref 4.5–11.0)

## 2015-11-14 LAB — MAGNESIUM (MG): MAGNESIUM (MG): 1.8 mg/dL (ref 1.5–2.6)

## 2015-11-14 LAB — ELECTROCARDIOGRAM WITH RHYTHM STRIP: QTC: 441

## 2015-11-14 LAB — PHOSPHORUS (PO4): PHOSPHORUS (PO4): 4.7 mg/dL (ref 2.4–5.0)

## 2015-11-14 LAB — INR: INR: 2.53 — AB (ref 0.87–1.18)

## 2015-11-14 MED ORDER — WARFARIN 2 MG TABLET
6.0000 mg | ORAL_TABLET | ORAL | Status: AC
Start: 2015-11-14 — End: 2015-11-14
  Administered 2015-11-14: 6 mg via ORAL
  Filled 2015-11-14: qty 3

## 2015-11-14 NOTE — Nurse Assessment (Signed)
ASSESSMENT NOTE    Note Started: 11/14/2015, 07:50     Initial assessment completed and recorded in EMR.  Report received from night shift nurse and orders reviewed. Plan of Care reviewed and appropriate, discussed with patient.  Charlett Langoanh Roney Youtz, RN

## 2015-11-14 NOTE — Allied Health Progress (Signed)
Intensive Case Management Follow Up Note    Comments: CM received text page from bedside RN stating pt would like to speak to this cm when possible today.      CM left message with Alben Spittle, [facility], inquiring if pt is medically stable and in agreement, can R & B accept pt today?    Paged MD Ferrel Logan to inquire if pt is stable to d/c today.  MD responded.  Pt is medically stable to d/c.  He will have PT see pt to clear her.  CM waiting to hear if pt can transfer to R & B today.      CM received response from [facility] that was secured yesterday for pt is not available for one week, because R & B operator went on vacation.  R & B operator is not comfortable letting pt come to his R & B today or this week, while he is out, due to pt's issues she has presented with & he wants to avoid a re-admission of pt.  Therefore, he can either take pt next week upon his return or Lattie Haw will try to locate another R & B for pt.  Lattie Haw will keep cm informed.      Updated MD via text page of the above.    CM spoke with pt at bedside.  Pt's bedside RN present during the meeting for witness.  Pt reported she wanted to express her assessment of events that have led to this point.  Per pt, she was told [facility] was going to look for a SNF, but then they found the one that she was already in and it wasn't nice, so she refused to go back there.  Per pt, she was seen by Alben Spittle to discuss room and board and she was told that she would have to pay the first month's rent, plus pay for next month on July 1, but she told Lattie Haw that is not how things works and she should have to have her rent pro-rated for June, not pay whole amount.  Pt stated she told Lattie Haw she only had $740, and denied she told her she had $980 in income for rent.  Pt stated then Lattie Haw got an attitude with her and "dropped me like a hot potato."  Pt insisted Lattie Haw demanded money from her for rent for R & B, and she won't help her with  finding a place and was rude to her.  Pt stated she then was called by Andee Poles at [facility] who is also trying to help her get into a place, and she stated she didn't know Andee Poles was involved or working to help her.  Pt stated she stated she doesn't understand why she can't go to a nursing home.      CM responded to pt and clarified the events that led to this point.  CM informed pt that pt was assigned to CM last week.  CM met with pt and plan for d/c was discussed (wait for [facility] to secure a SNF vs CM having a placement agency start looking for a R & B for pt as back up plan).  Pt was in agreement with this plan.  CM informed pt that pt did NOT meet criteria for SNF, as pt ambulating 300 ft with [facility] last week, yet despite this [facility] was still willing to authorize a SNF.  Then on Sunday, 6/11, pt informed [facility] secured a SNF (  [facility]), but pt refused to go.  CM informed pt this cm understands the pt was not in agreement with going to [facility], because pt was there before and it was a bad experience.  Pt nodded head in agreement with this statement.  CM stated meanwhile, while [facility] was looking for a SNF, CM asked Alben Spittle with [facility] to meet with pt and discuss R & B placement, cost, what pt was looking for in a place, etc.  CM stated this meeting occurred and cm spoke with Lattie Haw while she was still talking with pt, and the meeting went well, Lattie Haw did NOT drop pt like a "hot potato" and Lattie Haw discussed cost.  CM informed pt that Lattie Haw was first told pt had $980 in income, which is what pt told this CM, but then pt changed her statement and told Lattie Haw she only has $740, which is substantially less.  Lattie Haw advised pt she would work with this amount for June, but pt would have to pay for full rent of $850 on July 1.  CM informed pt that Lattie Haw advised pt the rent would be prorated for June.  CM informed pt that Lattie Haw listened to the pt, what she wanted and that pt had expressed she had been to  numerous R & B's and refused to go to them again and asked for specific places to be eliminated from the list, as she wouldn't go.  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  CM reiterated as Lattie Haw did that PATIENT MUST BE INDEPENDENT WITH AMBULATION AND SELF CARE IN ORDER TO GO TO A ROOM AND BOARD.  CM further relayed to pt she cannot afford a B & C, as too much.  Pt was told this prior.  CM stated Lattie Haw then located a R & B that could have taken pt yesterday, could have helped with meds, transport to and from appts, special diet for DM, but pt had syncope episode and pt thus could not go.  Now R & B operator will NOT accept pt while he is on vacation and he is on vacation thru next week.  CM informed pt that Lattie Haw is still looking for a R & B and if one is located, then pt will d/c, as pt does NOT meet criteria for hospital any longer.  CM further relayed to pt that [facility] will bill pt for hospitalization, as pt doesn't meet criteria and pt has been offered SNF and refused.  CM stated pt's results of syncope episode are normal and thus if R & B located CM will facilitate transfer for pt.  Furthermore, CM stated Andee Poles has been pt's cm for long time with [facility], has followed pt in outpatient setting (when pt capped to [facility]) and thus is there to help pt with setting up appts, rides, etc.  CM informed pt that a RN also follows from [place] and thus once we have placement secured, CM will contact [place] and RN who follow pt to coordinate f/u care, appts, services.      Pt reported she would like to go to R & B with Laverna Peace, as it sounds like a nice place.  Pt stated she would like to be considered to go there next week when Laverna Peace returns from vacation.  CM reiterated that is R & B opens for pt, CM will facilitate pt's  transfer to the facility, as pt doesn't meet criteria to be in hospital.  CM reiterated pt doesn't need to be in  hospital or SNF, and encouraged pt to continue to walk with nursing, learn wound care to foot ulcer, and motivate self to getting better, as pt is quite young to have to rely upon R & B placement at such a young age.  Pt agreed with this plan.      CM called [place], [place], [phone], to update on the above; left vm.        Plan:  1.  Alben Spittle from [facility] is searching for room and board, ph: [phone].        2.  Confirm with RN that pt is has been taught wound care to foot ulcer.    3.  If other R & B can't be located, pt has accepting R & B, [address], [place], [place], [place] [address].  [place], [phone] to arrange placement, once Laverna Peace returns from vacation next week.    4.  If pt d/c's to [facility] [place], [place], [phone], and pt's RN CM for outpatient svs, Lucianne Lei, [phone].      Date: 11/14/2015 Time: 09:05  Christy Gentles, MSW Intensive Clinical Case Manager/ Discharge Planner  [phone], [phone]

## 2015-11-14 NOTE — Plan of Care (Addendum)
Problem: Patient Care Overview (Adult)  Goal: Plan of Care Review  Outcome: Ongoing (interventions implemented as appropriate)  Goal Outcome Evaluation Note     Leslie Hernandez is a 47yr female admitted 10/20/2015      OUTCOME SUMMARY AND PLAN MOVING FORWARD:   Patient afebrile, with VSS, up to bedside commode x1 for BM and void. Patient and CM had a thorough discussion regarding discharge plans for patient (see CM note dated 6/14) for which this RN was present. Patient was in agreement of the plan and understands that she is medically cleared to leave the hospital. Patient up to bedside commode with nurse extern x1 for BM and to void. Patient reported to nurse extern that pt. Felt dizzy, so this RN came into room to assess. Upon arrival, patient was alert and oriented, and when this RN encouraged patient to perform perianal care and transfer back to bed, patient was able to do so and appeared to spontaneously recover from said dizziness. Patient did not ambulate today as every time RN encouraged patient to ambulate, patient would say that she feels tired or dizzy. Plan is to discharge to accepted R&B next week or to another R&B if one accepts patient earlier.     Goal: Individualization and Mutuality  Outcome: Ongoing (interventions implemented as appropriate)  Goal: Discharge Needs Assessment  Outcome: Ongoing (interventions implemented as appropriate)    Problem: Sleep Pattern Disturbance (Adult)  Goal: Adequate Sleep/Rest  Patient will demonstrate the desired outcomes by discharge/transition of care.   Outcome: Ongoing (interventions implemented as appropriate)    Problem: Fall Risk (Adult)  Goal: Absence of Falls  Patient will demonstrate the desired outcomes by discharge/transition of care.   Outcome: Ongoing (interventions implemented as appropriate)    Problem: Mobility, Physical Impaired (Adult)  Goal: Enhanced Mobility Skills  Patient will demonstrate the desired outcomes by discharge/transition of care.    Outcome: Ongoing (interventions implemented as appropriate)  Goal: Enhanced Functionality Ability  Patient will demonstrate the desired outcomes by discharge/transition of care.   Outcome: Ongoing (interventions implemented as appropriate)    Problem: Fluid Volume Excess (Adult,Obstetrics,Pediatric)  Goal: Stable Weight  Patient will demonstrate the desired outcomes by discharge/transition of care.   Outcome: Ongoing (interventions implemented as appropriate)  Goal: Balanced Intake/Output  Patient will demonstrate the desired outcomes by discharge/transition of care.   Outcome: Ongoing (interventions implemented as appropriate)    Problem: Pain, Acute (Adult)  Goal: Acceptable Pain Control/Comfort Level  Patient will demonstrate the desired outcomes by discharge/transition of care.   Outcome: Ongoing (interventions implemented as appropriate)    Problem: Pressure Ulcer Risk (Braden Scale) (Adult,Obstetrics,Pediatric)  Goal: Skin Integrity  Patient will demonstrate the desired outcomes by discharge/transition of care.   Outcome: Ongoing (interventions implemented as appropriate)

## 2015-11-14 NOTE — Plan of Care (Addendum)
Problem: Patient Care Overview (Adult)  Goal: Plan of Care Review  Outcome: Ongoing (interventions implemented as appropriate)  Goal Outcome Evaluation Note     Leslie Hernandez is a [age]yr female admitted 10/20/2015      OUTCOME SUMMARY AND PLAN MOVING FORWARD:   Ms. Leslie Hernandez's VS remained stable and she experienced no acute EKG changes overnight, remaining in sinus rhythm. She got OOBX1 and had one massive void (approximately 1200ml) using BSC. Ms. Leslie Hernandez reported nausea, which was relieved by the PRN antiemetics she had available. She continued to ask about her placement and stated that she felt she needed a higher level of care than a board and care could provide. Whenever she talked about her discharge, Ms. Leslie Hernandez would also mention that she felt dizzy/nauseated/not ready for discharge. However, for the most part Ms. Leslie Hernandez had an uneventful shift and slept the majority of the night.  Goal: Individualization and Mutuality  Outcome: Ongoing (interventions implemented as appropriate)  Goal: Discharge Needs Assessment  Outcome: Ongoing (interventions implemented as appropriate)    Problem: Sleep Pattern Disturbance (Adult)  Goal: Adequate Sleep/Rest  Patient will demonstrate the desired outcomes by discharge/transition of care.   Outcome: Ongoing (interventions implemented as appropriate)    Problem: Fall Risk (Adult)  Goal: Absence of Falls  Patient will demonstrate the desired outcomes by discharge/transition of care.   Outcome: Ongoing (interventions implemented as appropriate)    Problem: Mobility, Physical Impaired (Adult)  Goal: Enhanced Mobility Skills  Patient will demonstrate the desired outcomes by discharge/transition of care.   Outcome: Ongoing (interventions implemented as appropriate)  Goal: Enhanced Functionality Ability  Patient will demonstrate the desired outcomes by discharge/transition of care.   Outcome: Ongoing (interventions implemented as appropriate)    Problem: Fluid Volume Excess  (Adult,Obstetrics,Pediatric)  Goal: Stable Weight  Patient will demonstrate the desired outcomes by discharge/transition of care.   Outcome: Ongoing (interventions implemented as appropriate)  Goal: Balanced Intake/Output  Patient will demonstrate the desired outcomes by discharge/transition of care.   Outcome: Ongoing (interventions implemented as appropriate)    Problem: Pain, Acute (Adult)  Goal: Acceptable Pain Control/Comfort Level  Patient will demonstrate the desired outcomes by discharge/transition of care.   Outcome: Ongoing (interventions implemented as appropriate)    Problem: Pressure Ulcer Risk (Braden Scale) (Adult,Obstetrics,Pediatric)  Goal: Skin Integrity  Patient will demonstrate the desired outcomes by discharge/transition of care.   Outcome: Ongoing (interventions implemented as appropriate)

## 2015-11-14 NOTE — Allied Health Progress (Signed)
This was a follow-up visit. Pt was lying on bed. Pt said she was worried about her discharge plans. She is concerned which facility she will be taking to. Pt shared her frustration over an agent for "[facility]" who offered a facility that is not suitable for her and said "she used information she received concerning me against me".   Supportive and non-judgmental listening, compassionate care,.emotional support,and Pastoral presence were provided. Pt was receptive and appreciative and emotionally release. She progressed with trust and increased hopefulness.    Trude Mcburneyhaplain [place]lyn [place]  Pager # [phone]

## 2015-11-14 NOTE — Progress Notes (Signed)
INTERNAL MEDICINE DAILY PROGRESS NOTE  Date: 11/14/2015  Time:  17:16      Leslie Hernandez is a 50yo F with DM2, CKD4, PE on coumadin, dCHF, and HTN who presented 5/20 for progressive right eye vision loss 2/2 vitreal hemorrhage about to undergo vitrectomy but found to have supratherapeutic INR in PACU so sent to ED where she was found to have dyspnea, cough, rhinorrhea, nausea, and vomiting with an INR 4.87. She was found to have a Rhinovirus URI as well as decompensated CHF and subsequently admitted for further medical management.     24H INTERVAL HISTORY / SUBJECTIVE:  - No events on telemetry, no further syncopal events  - Appears to have lost her B&C placement per ICM    MEDICATIONS:  Scheduled Medications  Acetaminophen (TYLENOL) Tablet 1,000 mg, ORAL, Q8H  Amlodipine (NORVASC) Tablet 10 mg, ORAL, QAM  Atorvastatin (LIPITOR) Tablet 80 mg, ORAL, Daily Bedtime  Brimonidine (ALPHAGAN) 0.2 % Ophthalmic Solution 1 drop, BOTH Eyes, TID  Calcium Acetate (PHOSLO) Capsule 1,334 mg, ORAL, TID w/ meals  Carvedilol (COREG) Tablet 25 mg, ORAL, BID w/ meals  Docusate (COLACE) Capsule 200 mg, ORAL, BID  FUROsemide (LASIX) Tablet 80 mg, ORAL, QAM  Gabapentin (NEURONTIN) Capsule 100 mg, ORAL, TID  HydrALAZINE (APRESOLINE) Tablet 100 mg, ORAL, TID  Insulin Aspart (NOVOLOG) Injection Pen 1-4 Units, SUBCUTANEOUS, Daily Bedtime  Insulin Aspart (NOVOLOG) Injection Pen 1-6 Units, SUBCUTANEOUS, TID w/ meals - correction dose  Insulin Glargine (LANTUS) Injection 8 Units, SUBCUTANEOUS, Daily Bedtime  Lactobacillus (CULTURELLE) Capsule 1 capsule, ORAL, QAM  Lidocaine (LIDODERM) 5 %(700 mg/patch) Patch 2 patch, Transdermal, Q24H Now  Lidocaine patch REMOVAL 1 patch, Transdermal, Q24H Now  Neomycin/Polymyxin/Dexamethasone (MAXITROL) Ophthalmic Ointment 0.5 inch, RIGHT Eye, Q12H Now  Polyethylene Glycol 3350 (MIRALAX) Oral Powder Packet 17 g, ORAL, QAM  Sennosides (SENOKOT) Tablet 17.2 mg, ORAL, Daily Bedtime  Warfarin Patient Flag, NOT  APPLICABLE, PATIENT FLAG ONLY    IV Medications   PRN Medications  Albuterol (PROAIR HFA, PROVENTIL HFA, VENTOLIN HFA) Inhaler 1-2 puff, INHALATION, Q4H PRN  Bisacodyl (DULCOLAX) Suppository 10 mg, RECTALLY, Q24H PRN  Camphor/Menthol (SARNA) Lotion, TOPICAL, BID prn  Dextrose 50% Injection 12.5 g, IV, PRN  Dextrose 50% Injection 25 g, IV, PRN  Glucagon Injection 1 mg, IM, PRN  Guaifenesin -Dextromethorphan /62ml (ROBITUSSIN DM) Syrup 10 mL, ORAL, Q4H PRN  HydrALAZINE (APRESOLINE) Injection 10 mg, IV, Q4H PRN  Hydromorphone (DILAUDID) Tablet 2 mg, ORAL, Q4H PRN  Mineral Oil (FLEET) Enema 1 enema, RECTALLY, Bedtime PRN  Ocular Lubricant Ophthalmic Ointment 0.25 inch, RIGHT Eye, Q6H PRN  Ondansetron (ZOFRAN) Injection 4 mg, IV, Q8H PRN  Promethazine (PHENERGAN) Injection 12.5 mg, IV, Q6H PRN      OBJECTIVE:   Vital Signs  Summary  Temp Min: 36.6 C (97.9 F) Max: 37.2 C (98.9 F)  BP: (121-155)/(75-95)   Pulse Min: 67 Max: 75  Resp Min: 16 Max: 16  SpO2 Min: 97 % Max: 99 %      Current Vitals  Temp: 36.8 C (98.3 F)  BP: 121/75  Pulse: 67  Resp: 16  SpO2: 99 %      Weight: 106.2 kg (234 lb 2.1 oz)     Intake and Output  Last Two Completed Shifts  In: 710 [Oral:710]  Out: 2100 [Urine:2100]    Current Shift  In: 100 [Oral:100]  Out: -     Physical Exam    Physical Exam:  General: Middle age black female, No acute distress, conversational.  A+Ox3  HEENT: Moist oral mucosa. R eye chronic blindness, mild patchy erythema conjunctiva.Left pupil reactive to light.   CV: Regular rate and rhythm, nl s1s2, no murmurs, rubs or gallops. No definite JVD.  Pulm: Coarse BS 2/2 transmitted upper airway noises. No appreciable rales or wheeze.  Abd: Soft NT ND, +BS. No rebound or guarding  Skin: No rashes. No jaundice.  Neuro: face symmetric, moving all 4 extremities spontaneously.    CN II-XII intact, intact finger-nose, heel shin no vertical nystagmus  Extrem: trace edema bilaterally    LINES AND DRAINS:     piv    LAB TESTS/STUDIES:   I personally reviewed the following report(s)  .  Lab Results - 24 hours (excluding micro and POC)   BASIC METABOLIC PANEL     Status: Abnormal   Result Value Status    SODIUM 138 Final    POTASSIUM 4.7 Final    CHLORIDE 107 Final    CARBON DIOXIDE TOTAL 24 Final    UREA NITROGEN, BLOOD (BUN) 64 (H) Final    CREATININE BLOOD 3.94 (H) Final    E-GFR, AFRICAN AMERICAN 15 (L) Final    E-GFR, NON-AFRICAN AMERICAN 13 (L) Final    GLUCOSE 133 (H) Final    CALCIUM 8.7 Final   MAGNESIUM (MG)     Status: None   Result Value Status    MAGNESIUM (MG) 1.8 Final   PHOSPHORUS (PO4)     Status: None   Result Value Status    PHOSPHORUS (PO4) 4.7 Final   INR     Status: Abnormal   Result Value Status    INR 2.53 (H) Final   CBC WITH DIFFERENTIAL     Status: Abnormal   Result Value Status    WHITE BLOOD CELL COUNT 3.9 (L) Final    RED CELL COUNT 4.26 Final    HEMOGLOBIN 9.2 (L) Final    HEMATOCRIT 30.0 (L) Final    MCV 70.4 (L) Final    MCH 21.6 (L) Final    MCHC 30.6 (L) Final    RDW 19.4 (H) Final    MPV 6.9 Final    PLATELET COUNT 276 Final    NEUTROPHILS % AUTO 43.7 Final    LYMPHOCYTES % AUTO 41.0 Final    MONOCYTES % AUTO 7.7 Final    EOSINOPHIL % AUTO 6.8 Final    BASOPHILS % AUTO 0.8 Final    NEUTROPHIL ABS AUTO 1.70 (L) Final    LYMPHOCYTE ABS AUTO 1.6 Final    MONOCYTES ABS AUTO 0.3 Final    EOSINOPHIL ABS AUTO 0.3 Final    BASOPHILS ABS AUTO 0 Final     INFLUENZA A Negative  Negative  Final     INFLUENZA B Negative  Negative  Final    RESPIRATORY SYNCYTIAL VIRUS Negative  Negative  Final    PARAINFLUENZA Negative  Negative  Final    CORONAVIRUS Negative  Negative  Final    HUMAN METAPNEUMOVIRUS Negative  Negative  Final    RHINOVIRUS/ENTEROVIRUS POSITIVE (Abnl) Negative  Final    ADENOVIRUS Negative  Negative  Final    HUMAN BOCAVIRUS Negative  Negative  Final    CHLAMYDOPHILA PNEUMONIAE Negative  Negative  Final    MYCOPLASMA PNEUMONIAE Negative  Negative  Final    Comment:        Xray left foot:  Flattening and fragmentation of the navicular bone, compatible with  subacute fracture. Regions of sclerosis within the navicular bone likely  reflect  osteonecrosis.  Old healed fracture of the proximal fifth metatarsal.    CXR 11/06/15  IMPRESSION:  STABLE CARDIOMEGALY. CONSIDER PERICARDIAL EFFUSION.  MILD INCREASE IN INTERSTITIAL MARKINGS, ESPECIALLY IN THE LEFT PERIHILAR  REGION AND LUNG BASE. THE FINDINGS ARE COMPATIBLE WITH PULMONARY EDEMA BUT A BILATERAL INFECTIOUS PROCESS COULD HAVE A SIMILAR APPEARANCE    ASSESSMENT & PLAN:      50 year old female past medical history of DM-2, CKD-4, PE on warfarin, dCHF, HTN with CHF exacerbation and subacute vitreal hemorrhage requiring ophtho retinal surgery which occurred but delayed due to supratherapeutic INR when admitted.    # Syncope  Syncopal episode today while ambulating in the hallways.  On exam with generalized diffuse weakness non-focal exam, glucose normal and vital signs remained normal.  CT Head negative for acute trauma.  Although there is suspicion from members of caregiving team that there may be secondary gain as similar episodes have occurred upon discussion of placement options, her rapid recovery is also consistent with syncope and did receive 2 doses of PO lasix last night.  - declined orthostatic evaluation  - continue telemetry monitoring, as may help elucidate any secondary gain with repeat event  - has recent echo from 5/21    # Glaucoma  # Vitreal hemorrhage with increased IOP s/p urgent retinal surgery  # Diabetic retinopathy  # Right eye blindness  Transferred to [place] for urgent retinal surgery for elevated IOP but procedure delayed due to supratherapeutic INR and vitrious hemorrage. S/p vitrectomy and laser photocoagulatuion by [place] [facility] on 5/25.  - appreciate ophthal recs, last seen 6/12  - continue bromonidine gtt both eyes TID  - maxitrol R eye bid x 2 weeks (6/4-6/18)  - lubricant R eye qid prn  - F/u 3 weeks  as outpatient at [facility] or [place] per patient preference                          (schedule with Dr. Willaim Bane at [facility] or Dr. Olevia Bowens at [facility])    # HTN  Persistently hypertensive. Already on hydralazine 100mg  TID, amlodipine 10mg  daily, and lasix 80mg  daily. Started on coreg, uptitrating  - Coreg 25 mg bid in addition to above    #AKI on CKD  # Acute on chronic heart failure, diastolic  Baseline Cr 2.3, presented with Cr 3.5 which rose with diuresis.  Was given IVF with minimal improvement. Cr stable 4.09 (peak 4.18) on discharge. Has symmetric 1-2+ pitting edema of lower extremities- improving. BNP improved from 5/20, thus unlikely 2/2 cardiorenal.  - Will attempt for better BP control, uptitrating coreg.   - renally dose medications and avoid nephrotoxin  - continue lasix 80mg  po daily   - monitor response in Cr closely  - Fluid restriction, daily standing weight  - outpatient referral nephrology for management    # Anemia  Iron studies suggestive of anemia of chronic disease.  Hgb stable 8-9 while in house.    # Cough  # rhinorhea  Patient with rhinovirus but has been in-house for 16 days, no longer having significant secretions.   - continue guaifenisin cough syrup PRN  - d/c isolation precautions    # DM2  # Peripheral neuropathy  # Retinopathy  A1c 7.6% in May 2017. Gabapentin reduced to 100mg  TID given renal function.  Lantus added 6/1  - continue lantus 8u QHS + SSI with meals    # PE (saddle embolus) status post IVC filter  #  Supratherapeutic INR  The PE was diagnosed 3 years ago at [facility] with IVC placed with plan for lifelong [facility] given unprovoked and severity. Feb V/Q without evidence of PE. Was on coumadin 20mg  daily resulting in supratherapeutic INR on presentation here. Now with therapeutic INR.  DOAC n/a given worsening CKD.  - [facility] pharmacy to follow  - warfarin 6mg  x 1 today and on discharge, f/u coumadin clinic    # Left foot fracture  Recent Charcot's neuropathic osteoarthropathy left  foot versus neuropathic fracture of the navicular bone and fifth metatarsal. Seen by ortho with worsening osteonecrosis on xray.  - Patient must wear orthotics (CAM boot) boot when ambulating.   - Patient will need follow up ortho outpt ~1wk following discharge.    # FEN: renal/diabetic diet  # DVT Prophylaxis: Coumadin  # Code status: Full    # Dispo: Medically stable for discharge back to SNF / B&C if remains stable overnight on telemetry    Report Electronically Signed By:    Kennith Maes, MD  Associate Physician - Internal Medicine  Section of [facility] Medicine  [facility]   Pager # [phone]  PI # [id number]

## 2015-11-15 LAB — BASIC METABOLIC PANEL
CALCIUM: 8.5 mg/dL — AB (ref 8.6–10.5)
CARBON DIOXIDE TOTAL: 23 meq/L — AB (ref 24–32)
CHLORIDE: 104 meq/L (ref 95–110)
CREATININE BLOOD: 3.9 mg/dL — AB (ref 0.44–1.27)
E-GFR, AFRICAN AMERICAN: 15 SEE NOTE — AB (ref >60)
E-GFR, NON-AFRICAN AMERICAN: 13 — AB (ref >60)
GLUCOSE: 198 mg/dL — AB (ref 70–99)
POTASSIUM: 4.7 meq/L (ref 3.3–5.0)
SODIUM: 136 meq/L (ref 135–145)
UREA NITROGEN, BLOOD (BUN): 66 mg/dL — AB (ref 8–22)

## 2015-11-15 LAB — CBC WITH DIFFERENTIAL
BASOPHILS % AUTO: 0.9 %
BASOPHILS ABS AUTO: 0 10*3/uL (ref 0–0.2)
EOSINOPHIL % AUTO: 5.8 %
EOSINOPHIL ABS AUTO: 0.3 10*3/uL (ref 0–0.5)
HEMATOCRIT: 30.1 % — AB (ref 34–46)
HEMOGLOBIN: 9.3 g/dL — AB (ref 12.0–16.0)
LYMPHOCYTE ABS AUTO: 1.5 10*3/uL (ref 1.0–4.8)
LYMPHOCYTES % AUTO: 34 %
MCH: 21.8 pg — AB (ref 27–33)
MCHC: 30.9 % — AB (ref 32–36)
MCV: 70.3 UM3 — AB (ref 80–100)
MONOCYTES % AUTO: 6.8 %
MONOCYTES ABS AUTO: 0.3 10*3/uL (ref 0.1–0.8)
MPV: 7.1 UM3 (ref 6.8–10.0)
NEUTROPHIL ABS AUTO: 2.3 10*3/uL (ref 1.80–7.70)
NEUTROPHILS % AUTO: 52.5 %
PLATELET COUNT: 282 10*3/uL (ref 130–400)
RDW: 18.9 U — AB (ref 0–14.7)
RED CELL COUNT: 4.29 10*6/uL (ref 3.7–5.5)
WHITE BLOOD CELL COUNT: 4.3 10*3/uL — AB (ref 4.5–11.0)

## 2015-11-15 LAB — MAGNESIUM (MG): MAGNESIUM (MG): 2 mg/dL (ref 1.5–2.6)

## 2015-11-15 LAB — INR: INR: 2.85 — AB (ref 0.87–1.18)

## 2015-11-15 LAB — PHOSPHORUS (PO4): PHOSPHORUS (PO4): 4.3 mg/dL (ref 2.4–5.0)

## 2015-11-15 MED ORDER — WARFARIN 2 MG TABLET
5.0000 mg | ORAL_TABLET | ORAL | Status: AC
Start: 2015-11-15 — End: 2015-11-15
  Administered 2015-11-15: 5 mg via ORAL
  Filled 2015-11-15: qty 1

## 2015-11-15 NOTE — Plan of Care (Signed)
Problem: Patient Care Overview (Adult)  Goal: Plan of Care Review  Outcome: Ongoing (interventions implemented as appropriate)  Goal Outcome Evaluation Note     Leslie Hernandez is a 26yr female admitted 10/20/2015      OUTCOME SUMMARY AND PLAN MOVING FORWARD:   Pt. Ambulated x1 with PT using FWW to hall and back, orthostatic vitals obtained. Patient reported feeling dizzy during ambulation. Patient up to chair for about 3 hours. Owners of a board and care came to see patient and offered their facility for the week before Jimmy from accepted facility comes back from vacation, expected to return next week. Patient declined the offer and said "I will wait for Jimmy to come back".             Goal: Individualization and Mutuality  Outcome: Ongoing (interventions implemented as appropriate)  Goal: Discharge Needs Assessment  Outcome: Ongoing (interventions implemented as appropriate)    Problem: Sleep Pattern Disturbance (Adult)  Goal: Adequate Sleep/Rest  Patient will demonstrate the desired outcomes by discharge/transition of care.   Outcome: Ongoing (interventions implemented as appropriate)    Problem: Fall Risk (Adult)  Goal: Absence of Falls  Patient will demonstrate the desired outcomes by discharge/transition of care.   Outcome: Ongoing (interventions implemented as appropriate)    Problem: Mobility, Physical Impaired (Adult)  Goal: Enhanced Mobility Skills  Patient will demonstrate the desired outcomes by discharge/transition of care.   Outcome: Ongoing (interventions implemented as appropriate)  Goal: Enhanced Functionality Ability  Patient will demonstrate the desired outcomes by discharge/transition of care.   Outcome: Ongoing (interventions implemented as appropriate)    Problem: Fluid Volume Excess (Adult,Obstetrics,Pediatric)  Goal: Stable Weight  Patient will demonstrate the desired outcomes by discharge/transition of care.   Outcome: Ongoing (interventions implemented as appropriate)  Goal: Balanced  Intake/Output  Patient will demonstrate the desired outcomes by discharge/transition of care.   Outcome: Ongoing (interventions implemented as appropriate)    Problem: Pain, Acute (Adult)  Goal: Acceptable Pain Control/Comfort Level  Patient will demonstrate the desired outcomes by discharge/transition of care.   Outcome: Ongoing (interventions implemented as appropriate)    Problem: Pressure Ulcer Risk (Braden Scale) (Adult,Obstetrics,Pediatric)  Goal: Skin Integrity  Patient will demonstrate the desired outcomes by discharge/transition of care.   Outcome: Ongoing (interventions implemented as appropriate)

## 2015-11-15 NOTE — Nurse Assessment (Signed)
ASSESSMENT NOTE    Note Started: 11/15/2015, 07:07     Initial assessment completed and recorded in EMR.  Report received from night shift nurse and orders reviewed. Patient awake in bed, denies pain, reports that she feels relaxed and that she enjoyed her bath bed from last night. Is motivated to get OOB for lunch today. Plan of Care reviewed and appropriate, discussed with patient.  Charlett Langoanh Dannell Raczkowski, RN

## 2015-11-15 NOTE — Nurse Assessment (Signed)
ASSESSMENT NOTE    Note Started: 11/15/2015, 19:31     Initial assessment completed and recorded in EMR.  Report received from day shift nurse and orders reviewed. Plan of Care reviewed and appropriate, discussed with patient. Patient is a little nauseated right now. Patient's goal for this shift is to be able to rest for the night. Will keep patient safe and comfortable. Will continue to monitor. Marshell GarfinkelLoradyl Harjit Douds,  RN

## 2015-11-15 NOTE — Allied Health Procedure (Signed)
Mobility Program Data Extraction Note  (See Physical Therapy Notes for Clinical Information)        Mobility Phase Guidelines: [email or web address]    Last Documented Mobility Phase: Phase 3    Current Phase: Phase 3    Mobility Session Initiated with Physical Therapy?: Yes    Mobility Session Completed with Physical Therapy?: Yes    Mobility Program Status: In Progress      ======================================================================       Phase I  Phase II    Command and physical response activation  Arousal/ orientation/ communication Degree of Interation: Low Cooperation    Command and verbal response activation     Patient and/or caregiver education    Arousal and orientation degree of Interaction: Comatose, Unarousable    Musculoskeletal Program: Advancement (AROM, AAROM, resistive training, metered exercise UE/LE    Musculoskeletal Program: Positioning Head to Feet: prevent subluxation, joint malalignment, manage tone, manage edema, visual-spatial orientation, PROM all limbs: proximal to distal, facilitate basic AROM  Participation in beginning components of bed mobility: Reaching, rolling, active LE      Sensorimotor Program: midline orientation, reflexes, tactile feedback  Positioning Head to Feet: prevent subluxation, joint malalignment, manage tone, manage edema, visual-spatial orientation    Caregiver education and participation as appropriate  Sensorimotor Program: visual attention, midline orientation, righting reactions    Dependent splint/orthotic application  Supported sitting EOB, cardiac chair, wheelchair    Dependent mobilization out of bed (to cardiac chair)  Mobilize out of bed: Passive Transfers Dependent through Max Assist  ?Lift Team indicated    Encourage patient participation with bed-level ADL's: Hygiene/grooming; self-feeding; upper body sponge bathing, upper body dressing  Mobilize out of bed:  Assess transfer type, level of assist, tolerance      Sitting schedule implemented all shifts      EOB: Supported, unsupported or challenged balance activities      Supported sitting exercise: metered exercise UE/LE      Pre-gait training (dependent to moderate assist)      Other mobilization: Tile Table Program      Passive Splint/Orthotic Application      Encourage patient participation with edge of bed ADL's: Hygiene/grooming; self-feeding; upper body sponge bathing/dressing; lower body sponge bathing    Phase III  Phase IV    Arousal/orientation/communication Degree of Interaction: Moderate Cooperation  Degree of interactionTheatre stage manager for patient/family   x Patient and/or cargiver education as appropriate (incorporate any appropriate activity)  Advanced Musculoskeletal Program: Resistive, metered exercise UE/LE   x Musculoskeletal Program: Advancement (AROM, AAROM, resistive training metered exercise UE/LE, Object Manipulation)  Advanced bed mobility/incorporate nursing all shifts     Sensorimotor Program: proprioception feeback, coordination reactions  Sensorimotor Program: timing, skilled voluntary control of limbs, position ; direction change reactions    x Caregiver education and participation  Functional transfer training (commode or chair)/incorporate nursing all shifts   x Bed mobility advancement  High level balance activities   x Sitting balance advancement: Static and dynamic trunk activities  Gait program or (wheelchair mobility for wheelchair-dependent only), incorporate nursing all shifts   x Advance out of bed sitting tolerance  Active splint/orthotic application   x Active assisted transfer training and advancement  Encourage patient participation in ADL's in bathroom setting: Use of regular toilet; ADL's at sink-side; consider use of shower stall    Standing Activities (Pre-gait): Supported/unsupported, Static/dynamic; tilt table     x Gait Training/assisted gait      Active  assistive splint/orthotic application  Encourage Patient participation in seated ADL Activities OOB in bedside chair/wheelchair/ cardiac chair/ bedside commode: Hygiene/grooming; self-feeding; upper body sponge bathing/ dressing; lower Body sponge bathing/ dressing; toileting       Phase D for Pediatric use    ======================================================================    Electronically signed by:     Leeam Cedrone  Physical Therapist Assistant II  Department of Physical Medicine & Rehabilitation  Vocera # [id number] (Kloi Brodman)   PI #[id number]

## 2015-11-15 NOTE — Plan of Care (Signed)
Problem: Patient Care Overview (Adult)  Goal: Plan of Care Review  Outcome: Ongoing (interventions implemented as appropriate)  Goal Outcome Evaluation Note     Leslie Hernandez is a [age]yr female admitted 10/20/2015      OUTCOME SUMMARY AND PLAN MOVING FORWARD:   Ms. Leslie Hernandez's VS remained stable overnight and she remained in sinus rhythm with no acute EKG events overnight. Ms. Leslie Hernandez reported nausea around 2100, which was relieved by the IV Zofran ordered. Patient got OOB to the commodeX1 where she had one 108400ml+ void. Patient's R eye noted to be watering more than on past couple of days. Otherwise, patient experienced no acute physical changes overnight. The plan moving forward is to discharge Ms. Leslie Hernandez to a board in care when a bed becomes available at an accepting facility.   Goal: Individualization and Mutuality  Outcome: Ongoing (interventions implemented as appropriate)  Goal: Discharge Needs Assessment  Outcome: Ongoing (interventions implemented as appropriate)    Problem: Sleep Pattern Disturbance (Adult)  Goal: Adequate Sleep/Rest  Patient will demonstrate the desired outcomes by discharge/transition of care.   Outcome: Ongoing (interventions implemented as appropriate)    Problem: Fall Risk (Adult)  Goal: Absence of Falls  Patient will demonstrate the desired outcomes by discharge/transition of care.   Outcome: Ongoing (interventions implemented as appropriate)    Problem: Mobility, Physical Impaired (Adult)  Goal: Enhanced Mobility Skills  Patient will demonstrate the desired outcomes by discharge/transition of care.   Outcome: Ongoing (interventions implemented as appropriate)  Goal: Enhanced Functionality Ability  Patient will demonstrate the desired outcomes by discharge/transition of care.   Outcome: Ongoing (interventions implemented as appropriate)    Problem: Fluid Volume Excess (Adult,Obstetrics,Pediatric)  Goal: Stable Weight  Patient will demonstrate the desired outcomes by discharge/transition of  care.   Outcome: Ongoing (interventions implemented as appropriate)  Goal: Balanced Intake/Output  Patient will demonstrate the desired outcomes by discharge/transition of care.   Outcome: Ongoing (interventions implemented as appropriate)    Problem: Pain, Acute (Adult)  Goal: Acceptable Pain Control/Comfort Level  Patient will demonstrate the desired outcomes by discharge/transition of care.   Outcome: Ongoing (interventions implemented as appropriate)

## 2015-11-15 NOTE — Progress Notes (Signed)
INTERNAL MEDICINE DAILY PROGRESS NOTE  Date: 11/15/2015  Time:  17:09      [name] is a 50yo F with DM2, CKD4, PE on coumadin, dCHF, and HTN who presented 5/20 for progressive right eye vision loss 2/2 vitreal hemorrhage about to undergo vitrectomy but found to have supratherapeutic INR in PACU so sent to ED where she was found to have dyspnea, cough, rhinorrhea, nausea, and vomiting with an INR 4.87. She was found to have a Rhinovirus URI as well as decompensated CHF and subsequently admitted for further medical management.     24H INTERVAL HISTORY / SUBJECTIVE:  - No events on telemetry, no further syncopal events  - Felt weak during HD, asking for Iron levels to be checked.    MEDICATIONS:  Scheduled Medications  Acetaminophen (TYLENOL) Tablet 1,000 mg, ORAL, Q8H  Amlodipine (NORVASC) Tablet 10 mg, ORAL, QAM  Atorvastatin (LIPITOR) Tablet 80 mg, ORAL, Daily Bedtime  Brimonidine (ALPHAGAN) 0.2 % Ophthalmic Solution 1 drop, BOTH Eyes, TID  Calcium Acetate (PHOSLO) Capsule 1,334 mg, ORAL, TID w/ meals  Carvedilol (COREG) Tablet 25 mg, ORAL, BID w/ meals  Docusate (COLACE) Capsule 200 mg, ORAL, BID  FUROsemide (LASIX) Tablet 80 mg, ORAL, QAM  Gabapentin (NEURONTIN) Capsule 100 mg, ORAL, TID  HydrALAZINE (APRESOLINE) Tablet 100 mg, ORAL, TID  Insulin Aspart (NOVOLOG) Injection Pen 1-4 Units, SUBCUTANEOUS, Daily Bedtime  Insulin Aspart (NOVOLOG) Injection Pen 1-6 Units, SUBCUTANEOUS, TID w/ meals - correction dose  Insulin Glargine (LANTUS) Injection 8 Units, SUBCUTANEOUS, Daily Bedtime  Lactobacillus (CULTURELLE) Capsule 1 capsule, ORAL, QAM  Lidocaine (LIDODERM) 5 %(700 mg/patch) Patch 2 patch, Transdermal, Q24H Now  Lidocaine patch REMOVAL 1 patch, Transdermal, Q24H Now  Neomycin/Polymyxin/Dexamethasone (MAXITROL) Ophthalmic Ointment 0.5 inch, RIGHT Eye, Q12H Now  Polyethylene Glycol 3350 (MIRALAX) Oral Powder Packet 17 g, ORAL, QAM  Sennosides (SENOKOT) Tablet 17.2 mg, ORAL, Daily Bedtime  Warfarin Patient Flag,  NOT APPLICABLE, PATIENT FLAG ONLY    IV Medications   PRN Medications  Albuterol (PROAIR HFA, PROVENTIL HFA, VENTOLIN HFA) Inhaler 1-2 puff, INHALATION, Q4H PRN  Bisacodyl (DULCOLAX) Suppository 10 mg, RECTALLY, Q24H PRN  Camphor/Menthol (SARNA) Lotion, TOPICAL, BID prn  Dextrose 50% Injection 12.5 g, IV, PRN  Dextrose 50% Injection 25 g, IV, PRN  Glucagon Injection 1 mg, IM, PRN  Guaifenesin 100mg -Dextromethorphan 10mg /545ml (ROBITUSSIN DM) Syrup 10 mL, ORAL, Q4H PRN  HydrALAZINE (APRESOLINE) Injection 10 mg, IV, Q4H PRN  Hydromorphone (DILAUDID) Tablet 2 mg, ORAL, Q4H PRN  Mineral Oil (FLEET) Enema 1 enema, RECTALLY, Bedtime PRN  Ocular Lubricant Ophthalmic Ointment 0.25 inch, RIGHT Eye, Q6H PRN  Ondansetron (ZOFRAN) Injection 4 mg, IV, Q8H PRN  Promethazine (PHENERGAN) Injection 12.5 mg, IV, Q6H PRN      OBJECTIVE:   Vital Signs  Summary  Temp Min: 36.5 C (97.7 F) Max: 36.8 C (98.3 F)  BP: (135-155)/(82-88)   Pulse Min: 71 Max: 75  Resp Min: 16 Max: 16  SpO2 Min: 99 % Max: 100 %      Current Vitals  Temp: 36.5 C (97.7 F)  BP: 135/82  Pulse: 72  Resp: 16  SpO2: 100 %      Weight: 102.7 kg (226 lb 6.6 oz)     Intake and Output  Last Two Completed Shifts  In: 980 [Oral:980]  Out: 1100 [Urine:1100]    Current Shift  In: -   Out: 450 [Urine:450]    Physical Exam    Physical Exam:  General: Middle age black female, No acute  distress, conversational. A+Ox3  HEENT: Moist oral mucosa. R eye chronic blindness, mild patchy erythema conjunctiva.Left pupil reactive to light.   CV: Regular rate and rhythm, nl s1s2, no murmurs, rubs or gallops. No definite JVD.  Pulm: Coarse BS 2/2 transmitted upper airway noises. No appreciable rales or wheeze.  Abd: Soft NT ND, +BS. No rebound or guarding  Skin: No rashes. No jaundice.  Neuro: face symmetric, moving all 4 extremities spontaneously.    CN II-XII intact, intact finger-nose, heel shin no vertical nystagmus  Extrem: trace edema bilaterally    LINES AND DRAINS:     piv    LAB TESTS/STUDIES:   I personally reviewed the following report(s)  .  Lab Results - 24 hours (excluding micro and POC)   URINALYSIS AND CULTURE IF IND     Status: Abnormal   Result Value Status    COLLECTION Clean Catch Final    COLOR Yellow Final    CLARITY Clear Final    SPECIFIC GRAVITY 1.012 Final    pH URINE 6.5 Final    OCCULT BLOOD URINE Negative Final    BILIRUBIN URINE Negative Final    KETONES Negative Final    GLUCOSE URINE 500 Final    PROTEIN URINE 300 (Abnl) Final    UROBILINOGEN. Negative Final    NITRITE URINE Negative Final    LEUK. ESTERASE Negative Final    MICROSCOPIC INDICATED Final    WBC 3 Final    RBC 0-5 Final    BACTERIA/HPF MODERATE (Abnl) Final    SQUAMOUS EPI 3 Final    MUCOUS/LPF Few Final    URINE CULTURE INDICATED (Abnl) Final   CULTURE URINE, BACTI     Status: None (Preliminary result)   Result Value Status    CULTURE URINE  Preliminary       CULTURE URINE  Preliminary                                                                     CULTURE GROWTH TOO YOUNG; NEEDS ADDITIONAL INCUBATION            PRIOR TO WORK-UP                                            BASIC METABOLIC PANEL     Status: Abnormal   Result Value Status    SODIUM 136 Final    POTASSIUM 4.7 Final    CHLORIDE 104 Final    CARBON DIOXIDE TOTAL 23 (L) Final    UREA NITROGEN, BLOOD (BUN) 66 (H) Final    CREATININE BLOOD 3.90 (H) Final    E-GFR, AFRICAN AMERICAN 15 (L) Final    E-GFR, NON-AFRICAN AMERICAN 13 (L) Final    GLUCOSE 198 (H) Final    CALCIUM 8.5 (L) Final   MAGNESIUM (MG)     Status: None   Result Value Status    MAGNESIUM (MG) 2.0 Final   PHOSPHORUS (PO4)     Status: None   Result Value Status    PHOSPHORUS (PO4) 4.3 Final   INR     Status: Abnormal   Result Value Status  INR 2.85 (H) Final   CBC WITH DIFFERENTIAL     Status: Abnormal   Result Value Status    WHITE BLOOD CELL COUNT 4.3 (L) Final    RED CELL COUNT 4.29 Final    HEMOGLOBIN 9.3 (L) Final    HEMATOCRIT 30.1 (L) Final    MCV 70.3 (L)  Final    MCH 21.8 (L) Final    MCHC 30.9 (L) Final    RDW 18.9 (H) Final    MPV 7.1 Final    PLATELET COUNT 282 Final    NEUTROPHILS % AUTO 52.5 Final    LYMPHOCYTES % AUTO 34.0 Final    MONOCYTES % AUTO 6.8 Final    EOSINOPHIL % AUTO 5.8 Final    BASOPHILS % AUTO 0.9 Final    NEUTROPHIL ABS AUTO 2.30 Final    LYMPHOCYTE ABS AUTO 1.5 Final    MONOCYTES ABS AUTO 0.3 Final    EOSINOPHIL ABS AUTO 0.3 Final    BASOPHILS ABS AUTO 0 Final     INFLUENZA A Negative  Negative  Final     INFLUENZA B Negative  Negative  Final    RESPIRATORY SYNCYTIAL VIRUS Negative  Negative  Final    PARAINFLUENZA Negative  Negative  Final    CORONAVIRUS Negative  Negative  Final    HUMAN METAPNEUMOVIRUS Negative  Negative  Final    RHINOVIRUS/ENTEROVIRUS POSITIVE (Abnl) Negative  Final    ADENOVIRUS Negative  Negative  Final    HUMAN BOCAVIRUS Negative  Negative  Final    CHLAMYDOPHILA PNEUMONIAE Negative  Negative  Final    MYCOPLASMA PNEUMONIAE Negative  Negative  Final    Comment:       Xray left foot:  Flattening and fragmentation of the navicular bone, compatible with  subacute fracture. Regions of sclerosis within the navicular bone likely  reflect osteonecrosis.  Old healed fracture of the proximal fifth metatarsal.    CXR 11/06/15  IMPRESSION:  STABLE CARDIOMEGALY. CONSIDER PERICARDIAL EFFUSION.  MILD INCREASE IN INTERSTITIAL MARKINGS, ESPECIALLY IN THE LEFT PERIHILAR  REGION AND LUNG BASE. THE FINDINGS ARE COMPATIBLE WITH PULMONARY EDEMA BUT A BILATERAL INFECTIOUS PROCESS COULD HAVE A SIMILAR APPEARANCE    ASSESSMENT & PLAN:      50 year old female past medical history of DM-2, CKD-4, PE on warfarin, dCHF, HTN with CHF exacerbation and subacute vitreal hemorrhage requiring ophtho retinal surgery which occurred but delayed due to supratherapeutic INR when admitted.    # Syncope  Syncopal episode 6/13 while ambulating in the hallways.  On exam with generalized diffuse weakness non-focal exam, glucose normal and vital signs  remained normal.  CT Head negative for acute trauma.  Although there is suspicion from members of caregiving team that there may be secondary gain as similar episodes have occurred upon discussion of placement options, her rapid recovery is also consistent with syncope and did receive 2 doses of PO lasix last night.  - declined orthostatic evaluation  - continue telemetry monitoring, as may help elucidate any secondary gain with repeat event  - has recent echo from 5/21    # Glaucoma  # Vitreal hemorrhage with increased IOP s/p urgent retinal surgery  # Diabetic retinopathy  # Right eye blindness  Transferred to [place] for urgent retinal surgery for elevated IOP but procedure delayed due to supratherapeutic INR and vitrious hemorrage. S/p vitrectomy and laser photocoagulatuion by ophthomology on 5/25.  - appreciate ophthal recs, last seen 6/12  - continue bromonidine gtt  both eyes TID  - maxitrol R eye bid x 2 weeks (6/4-6/18)  - lubricant R eye qid prn  - F/u 3 weeks as outpatient at [facility] or [facility] per patient preference                          (schedule with [facility] Willaim [facility]ane at [facility] or Dr. Olevia Bowens at [facility])    # HTN  Persistently hypertensive. Already on hydralazine 100mg  TID, amlodipine 10mg  daily, and lasix 80mg  daily. Started on coreg, uptitrating  - Coreg 25 mg bid in addition to above    #AKI on CKD  # Acute on chronic heart failure, diastolic  Baseline Cr 2.3, presented with Cr 3.5 which rose with diuresis.  Was given IVF with minimal improvement. Cr stable 4.09 (peak 4.18) on discharge. Has symmetric 1-2+ pitting edema of lower extremities- improving. BNP improved from 5/20, thus unlikely 2/2 cardiorenal.  - Will attempt for better BP control, uptitrating coreg.   - renally dose medications and avoid nephrotoxin  - continue lasix 80mg  po daily   - monitor response in Cr closely  - Fluid restriction, daily standing weight  - outpatient referral nephrology for management    # Anemia  Iron studies suggestive of  anemia of chronic disease.  Hgb stable 8-9 while in house.    # Cough  # rhinorhea  Patient with rhinovirus but has been in-house for 16 days, no longer having significant secretions.   - continue guaifenisin cough syrup PRN  - d/c isolation precautions    # DM2  # Peripheral neuropathy  # Retinopathy  A1c 7.6% in May 2017. Gabapentin reduced to 100mg  TID given renal function.  Lantus added 6/1  - continue lantus 8u QHS + SSI with meals    # PE (saddle embolus) status post IVC filter  # Supratherapeutic INR  The PE was diagnosed 3 years ago at [facility] with IVC placed with plan for lifelong AC given unprovoked and severity. Feb V/Q without evidence of PE. Was on coumadin 20mg  daily resulting in supratherapeutic INR on presentation here. Now with therapeutic INR.  DOAC n/a given worsening CKD.  - [facility] pharmacy to follow  - warfarin 5mg  x 1 today and on discharge, f/u coumadin clinic    # Left foot fracture  Recent Charcot's neuropathic osteoarthropathy left foot versus neuropathic fracture of the navicular bone and fifth metatarsal. Seen by ortho with worsening osteonecrosis on xray.  - Patient must wear orthotics (CAM boot) boot when ambulating.   - Patient will need follow up ortho outpt ~1wk following discharge.    # FEN: renal/diabetic diet  # DVT Prophylaxis: Coumadin  # Code status: Full    # Dispo: Medically stable for discharge back to SNF / B&C if remains stable overnight on telemetry    Report Electronically Signed By:    Kennith Maes, MD  Associate Physician - Internal Medicine  Section of [facility]  [facility]   Pager # [phone]  PI # [phone]

## 2015-11-15 NOTE — Allied Health Progress (Signed)
Date of Service: 11/15/2015   Time in: 1450  Total time: 30 minutes    Patient status discussed with nursing and patient cleared for physical therapy.     Patient received supine in bed and agreeable for physical therapy.     S: 3/10 pain in left abdomen, patient states she needs her "patch" on her left side.     O:   Patient Education:   -Importance of out of bed with physical therapy and up in chair 2-3x daily.     Bed Mobility:   -Donned CAM Boot prior to bed mobility   -supervised in bed mobility sup>sitting to edge of bed with patient placing HOB elevated. Patient states "this is how I get out of bed." [facility] is more    Transfers:   -squat pivot transfer from edge of bed> [facility]. Patient with poor safety awareness and [facility]" [facility]. Cues for slow descent and improved safety awareness.   -sit>[facility] with FWW stand-by assist.     Gait:   -stand-by assist <> contact-guard assist with fww 6125ft x 2 from room into hallway.Nursing following behind with wheelchair.  -Patient gaits with step to gait due to L CAM boot. Patient has slow methodic gait pacing and needs cues for upright posture.   -Patient with c/o of dizziness with short seated rest break in wheelchair in hallway. Patient with improved descent and hand placement on wheelchair.   -sit>stand from wheelchair with fww cues for hand placement and contact-guard assist.  -patient taking small 20-30 sec standing rest breaks during gait back into room.   -patient with slow descent onto chair post treatment.     Pt was positioned for comfort and safety with call light and all needs within reach in chair with tray table in front.   Care transitioned back to RN and discussed patient response to treatment.    Orthostatic Vitals:    Supine: BP  133/75(94)   Sitting: BP  148/73(98)   Standing:  BP 104/67(79) +for dizziness in standing  Post-Treatment:  BP  115/66(83)    RN: Physical Therapy Recommendations for  Nursing: Out of bed tor   chair via stand pivot transfer with fww.  Ambulate with physical therapy using fww and CAM boot on LLE.  Phase: 3    A: patient with improvement in functional mobility and tolerance to standing and gait with fww today. Patient has intermittent c/o of dizziness in standing. Patient with slight drop in BP from sitting>standing and c/o dizziness which subsided following standing. Patient eager to progress gait today and more motivated. Patient encouraged to be up to the chair for meals 2-3x daily.     >>Monitor sitting > standing blood pressure each treatment to assess patient's response to positional changes.     P: Continue current acute physical therapy goals for bed mobility, transfers and gait.     Leslie Hernandez  Physical Therapist Assistant II  Department of Physical Medicine & Rehabilitation  Vocera # [phone] [name]([name])   PI [phone]

## 2015-11-16 LAB — CBC WITH DIFFERENTIAL
BASOPHILS % AUTO: 0.7 %
BASOPHILS ABS AUTO: 0 10*3/uL (ref 0–0.2)
EOSINOPHIL % AUTO: 6.5 %
EOSINOPHIL ABS AUTO: 0.3 10*3/uL (ref 0–0.5)
HEMATOCRIT: 29.3 % — AB (ref 34–46)
HEMOGLOBIN: 9 g/dL — AB (ref 12.0–16.0)
LYMPHOCYTE ABS AUTO: 1.6 10*3/uL (ref 1.0–4.8)
LYMPHOCYTES % AUTO: 38.3 %
MCH: 21.6 pg — AB (ref 27–33)
MCHC: 30.7 % — AB (ref 32–36)
MCV: 70.6 UM3 — AB (ref 80–100)
MONOCYTES % AUTO: 7.3 %
MONOCYTES ABS AUTO: 0.3 10*3/uL (ref 0.1–0.8)
MPV: 7.1 UM3 (ref 6.8–10.0)
NEUTROPHIL ABS AUTO: 2 10*3/uL (ref 1.80–7.70)
NEUTROPHILS % AUTO: 47.2 %
PLATELET COUNT: 291 10*3/uL (ref 130–400)
RDW: 19.1 U — AB (ref 0–14.7)
RED CELL COUNT: 4.15 10*6/uL (ref 3.7–5.5)
WHITE BLOOD CELL COUNT: 4.2 10*3/uL — AB (ref 4.5–11.0)

## 2015-11-16 LAB — BASIC METABOLIC PANEL
CALCIUM: 8.5 mg/dL — AB (ref 8.6–10.5)
CARBON DIOXIDE TOTAL: 22 meq/L — AB (ref 24–32)
CHLORIDE: 107 meq/L (ref 95–110)
CREATININE BLOOD: 4.01 mg/dL — AB (ref 0.44–1.27)
E-GFR, AFRICAN AMERICAN: 14 SEE NOTE — AB (ref >60)
E-GFR, NON-AFRICAN AMERICAN: 12 — AB (ref >60)
GLUCOSE: 163 mg/dL — AB (ref 70–99)
POTASSIUM: 4.9 meq/L (ref 3.3–5.0)
SODIUM: 136 meq/L (ref 135–145)
UREA NITROGEN, BLOOD (BUN): 70 mg/dL — AB (ref 8–22)

## 2015-11-16 LAB — PHOSPHORUS (PO4): PHOSPHORUS (PO4): 4.7 mg/dL (ref 2.4–5.0)

## 2015-11-16 LAB — MAGNESIUM (MG): MAGNESIUM (MG): 1.9 mg/dL (ref 1.5–2.6)

## 2015-11-16 LAB — INR: INR: 2.65 — AB (ref 0.87–1.18)

## 2015-11-16 MED ORDER — WARFARIN 2 MG TABLET
5.0000 mg | ORAL_TABLET | ORAL | Status: AC
Start: 2015-11-16 — End: 2015-11-16
  Administered 2015-11-16: 5 mg via ORAL
  Filled 2015-11-16: qty 1

## 2015-11-16 NOTE — Nurse Assessment (Addendum)
ASSESSMENT NOTE    Note Started: 11/16/2015, 07:33     Initial assessment completed and recorded in EMR.  Report received from night shift nurse and orders reviewed. Patient reports 6/10 r-eye pain.  The plan for the day is ADL's, safety, and pain management.   Plan of Care reviewed and appropriate, discussed with patient.   Kari BaarsJoshua Demitrus Francisco, RN.

## 2015-11-16 NOTE — Plan of Care (Signed)
Problem: Patient Care Overview (Adult)  Goal: Plan of Care Review  Outcome: Ongoing (interventions implemented as appropriate)  Goal Outcome Evaluation Note     Leslie Hernandez is a [age]yr female admitted 10/20/2015      OUTCOME SUMMARY AND PLAN MOVING FORWARD:   Pt was nauseated at the start of the shift, refused to eat dinner, slept for the most part of the shift. Woke up around 0300H, snacks given. Patient verbalized that she is hurting emotionally. Emotional support given. Pt would like to talk with MD today. She wants to stay in a hotel while waiting for the owner of the [employer] and [employer] to return. Will pass it on to dayshift.      Goal: Individualization and Mutuality  Outcome: Ongoing (interventions implemented as appropriate)  Goal: Discharge Needs Assessment  Outcome: Ongoing (interventions implemented as appropriate)    Problem: Sleep Pattern Disturbance (Adult)  Goal: Adequate Sleep/Rest  Patient will demonstrate the desired outcomes by discharge/transition of care.   Outcome: Ongoing (interventions implemented as appropriate)    Problem: Fall Risk (Adult)  Goal: Absence of Falls  Patient will demonstrate the desired outcomes by discharge/transition of care.   Outcome: Ongoing (interventions implemented as appropriate)    Problem: Mobility, Physical Impaired (Adult)  Goal: Enhanced Mobility Skills  Patient will demonstrate the desired outcomes by discharge/transition of care.   Outcome: Ongoing (interventions implemented as appropriate)  Goal: Enhanced Functionality Ability  Patient will demonstrate the desired outcomes by discharge/transition of care.   Outcome: Ongoing (interventions implemented as appropriate)    Problem: Fluid Volume Excess (Adult,Obstetrics,Pediatric)  Goal: Stable Weight  Patient will demonstrate the desired outcomes by discharge/transition of care.   Outcome: Ongoing (interventions implemented as appropriate)  Goal: Balanced Intake/Output  Patient will demonstrate the desired outcomes by  discharge/transition of care.   Outcome: Ongoing (interventions implemented as appropriate)    Problem: Pain, Acute (Adult)  Goal: Acceptable Pain Control/Comfort Level  Patient will demonstrate the desired outcomes by discharge/transition of care.   Outcome: Ongoing (interventions implemented as appropriate)    Problem: Pressure Ulcer Risk (Braden Scale) (Adult,Obstetrics,Pediatric)  Goal: Skin Integrity  Patient will demonstrate the desired outcomes by discharge/transition of care.   Outcome: Ongoing (interventions implemented as appropriate)

## 2015-11-16 NOTE — Plan of Care (Signed)
Problem: Patient Care Overview (Adult)  Goal: Plan of Care Review  Outcome: Ongoing (interventions implemented as appropriate)  Goal Outcome Evaluation Note     Leslie Hernandez is a [age]yr female admitted 10/20/2015      OUTCOME SUMMARY AND PLAN MOVING FORWARD:      VSS. NAC. Pt ambulating in hallway with brace and walker. Pt also worked with OT tolerated well. Pt worked spoke with ICM about discharge needs. ICM asked RN to be present during meeting, pt rude and disgruntled towards this nurse after meeting. Good appetite. Linen changed. Assisted with ADL's. Awaiting placement. Will continue to monitor.         Goal: Individualization and Mutuality  Outcome: Ongoing (interventions implemented as appropriate)  Goal: Discharge Needs Assessment  Outcome: Ongoing (interventions implemented as appropriate)    Problem: Sleep Pattern Disturbance (Adult)  Goal: Adequate Sleep/Rest  Patient will demonstrate the desired outcomes by discharge/transition of care.   Outcome: Ongoing (interventions implemented as appropriate)    Problem: Fall Risk (Adult)  Goal: Absence of Falls  Patient will demonstrate the desired outcomes by discharge/transition of care.   Outcome: Ongoing (interventions implemented as appropriate)    Problem: Mobility, Physical Impaired (Adult)  Goal: Enhanced Mobility Skills  Patient will demonstrate the desired outcomes by discharge/transition of care.   Outcome: Ongoing (interventions implemented as appropriate)  Goal: Enhanced Functionality Ability  Patient will demonstrate the desired outcomes by discharge/transition of care.   Outcome: Ongoing (interventions implemented as appropriate)    Problem: Fluid Volume Excess (Adult,Obstetrics,Pediatric)  Goal: Stable Weight  Patient will demonstrate the desired outcomes by discharge/transition of care.   Outcome: Ongoing (interventions implemented as appropriate)  Goal: Balanced Intake/Output  Patient will demonstrate the desired outcomes by discharge/transition of  care.   Outcome: Ongoing (interventions implemented as appropriate)    Problem: Pain, Acute (Adult)  Goal: Acceptable Pain Control/Comfort Level  Patient will demonstrate the desired outcomes by discharge/transition of care.   Outcome: Ongoing (interventions implemented as appropriate)    Problem: Pressure Ulcer Risk (Braden Scale) (Adult,Obstetrics,Pediatric)  Goal: Skin Integrity  Patient will demonstrate the desired outcomes by discharge/transition of care.   Outcome: Ongoing (interventions implemented as appropriate)

## 2015-11-16 NOTE — Allied Health Progress (Signed)
NUTRITION SCREENING     Admission Date: 10/20/2015   Date of Service: 11/16/2015, 14:40     Patient continues with adequate PO intake, tolerating diet with no c/o GI distress.  Nepro supplement discontinued as patient not drinking it, c/o getting abdominal discomfort, has ~ 6 cans at bedside.  Patient c/o meals not having much taste, are bland.  RD to add snacks as patient c/o hungry at times. +BM 6/16.     Patient does not present at acute nutrition risk. No further nutrition intervention is warranted at this time. Will continue routine nutrition monitoring. Please consult Registered Dietitian if acute nutrition issues arise.    Report Electronically Signed By: Zeb ComfortHarjeet Michaeljames Milnes, MS, RD, CDE pager [phone]

## 2015-11-16 NOTE — Progress Notes (Signed)
INTERNAL MEDICINE DAILY PROGRESS NOTE  Date: 11/16/2015  Time:  18:55      Leslie Hernandez is a 50yo F with DM2, CKD4, PE on coumadin, dCHF, and HTN who presented 5/20 for progressive right eye vision loss 2/2 vitreal hemorrhage about to undergo vitrectomy but found to have supratherapeutic INR in PACU so sent to ED where she was found to have dyspnea, cough, rhinorrhea, nausea, and vomiting with an INR 4.87. She was found to have a Rhinovirus URI as well as decompensated CHF and subsequently admitted for further medical management.     24H INTERVAL HISTORY / SUBJECTIVE:  - No events on telemetry, no further syncopal events  - very upset after discussion with ICM - see ICM note from today for detailed discussion    MEDICATIONS:  Scheduled Medications  Acetaminophen (TYLENOL) Tablet 1,000 mg, ORAL, Q8H  Amlodipine (NORVASC) Tablet 10 mg, ORAL, QAM  Atorvastatin (LIPITOR) Tablet 80 mg, ORAL, Daily Bedtime  Brimonidine (ALPHAGAN) 0.2 % Ophthalmic Solution 1 drop, BOTH Eyes, TID  Calcium Acetate (PHOSLO) Capsule 1,334 mg, ORAL, TID w/ meals  Carvedilol (COREG) Tablet 25 mg, ORAL, BID w/ meals  Docusate (COLACE) Capsule 200 mg, ORAL, BID  FUROsemide (LASIX) Tablet 80 mg, ORAL, QAM  Gabapentin (NEURONTIN) Capsule 100 mg, ORAL, TID  HydrALAZINE (APRESOLINE) Tablet 100 mg, ORAL, TID  Insulin Aspart (NOVOLOG) Injection Pen 1-4 Units, SUBCUTANEOUS, Daily Bedtime  Insulin Aspart (NOVOLOG) Injection Pen 1-6 Units, SUBCUTANEOUS, TID w/ meals - correction dose  Insulin Glargine (LANTUS) Injection 8 Units, SUBCUTANEOUS, Daily Bedtime  Lactobacillus (CULTURELLE) Capsule 1 capsule, ORAL, QAM  Lidocaine (LIDODERM) 5 %(700 mg/patch) Patch 2 patch, Transdermal, Q24H Now  Lidocaine patch REMOVAL 1 patch, Transdermal, Q24H Now  Neomycin/Polymyxin/Dexamethasone (MAXITROL) Ophthalmic Ointment 0.5 inch, RIGHT Eye, Q12H Now  Polyethylene Glycol 3350 (MIRALAX) Oral Powder Packet 17 g, ORAL, QAM  Sennosides (SENOKOT) Tablet 17.2 mg, ORAL, Daily  Bedtime  Warfarin Patient Flag, NOT APPLICABLE, PATIENT FLAG ONLY    IV Medications   PRN Medications  Albuterol (PROAIR HFA, PROVENTIL HFA, VENTOLIN HFA) Inhaler 1-2 puff, INHALATION, Q4H PRN  Bisacodyl (DULCOLAX) Suppository 10 mg, RECTALLY, Q24H PRN  Camphor/Menthol (SARNA) Lotion, TOPICAL, BID prn  Dextrose 50% Injection 12.5 g, IV, PRN  Dextrose 50% Injection 25 g, IV, PRN  Glucagon Injection 1 mg, IM, PRN  Guaifenesin 100mg -Dextromethorphan 10mg /82ml (ROBITUSSIN DM) Syrup 10 mL, ORAL, Q4H PRN  HydrALAZINE (APRESOLINE) Injection 10 mg, IV, Q4H PRN  Hydromorphone (DILAUDID) Tablet 2 mg, ORAL, Q4H PRN  Mineral Oil (FLEET) Enema 1 enema, RECTALLY, Bedtime PRN  Ocular Lubricant Ophthalmic Ointment 0.25 inch, RIGHT Eye, Q6H PRN  Ondansetron (ZOFRAN) Injection 4 mg, IV, Q8H PRN  Promethazine (PHENERGAN) Injection 12.5 mg, IV, Q6H PRN      OBJECTIVE:   Vital Signs  Summary  Temp Min: 36.7 C (98 F) Max: 37.1 C (98.8 F)  BP: (101-150)/(59-91)   Pulse Min: 70 Max: 76  Resp Min: 16 Max: 18  SpO2 Min: 98 % Max: 100 %      Current Vitals  Temp: 37.1 C (98.8 F)  BP: 101/59 (standing)  Pulse: 70  Resp: 18  SpO2: 100 %      Weight: 101.3 kg (223 lb 5.2 oz)     Intake and Output  Last Two Completed Shifts  In: 300 [Oral:300]  Out: 2200 [Urine:2200]    Current Shift  In: 740 [Oral:740]  Out: 850 [Urine:850]    Physical Exam    Physical Exam:  General: Middle  age black female, No acute distress, conversational. A+Ox3  HEENT: Moist oral mucosa. R eye chronic blindness, mild patchy erythema conjunctiva.Left pupil reactive to light.   CV: Regular rate and rhythm, nl s1s2, no murmurs, rubs or gallops. No definite JVD.  Pulm: Coarse BS 2/2 transmitted upper airway noises. No appreciable rales or wheeze.  Abd: Soft NT ND, +BS. No rebound or guarding  Skin: No rashes. No jaundice.  Neuro: face symmetric, moving all 4 extremities spontaneously.    CN II-XII intact, intact finger-nose, heel shin no vertical  nystagmus  Extrem: trace edema bilaterally    LINES AND DRAINS:    piv    LAB TESTS/STUDIES:   I personally reviewed the following report(s)  .  Lab Results - 24 hours (excluding micro and POC)   BASIC METABOLIC PANEL     Status: Abnormal   Result Value Status    SODIUM 136 Final    POTASSIUM 4.9 Final    CHLORIDE 107 Final    CARBON DIOXIDE TOTAL 22 (L) Final    UREA NITROGEN, BLOOD (BUN) 70 (H) Final    CREATININE BLOOD 4.01 (H) Final    E-GFR, AFRICAN AMERICAN 14 (L) Final    E-GFR, NON-AFRICAN AMERICAN 12 (L) Final    GLUCOSE 163 (H) Final    CALCIUM 8.5 (L) Final   MAGNESIUM (MG)     Status: None   Result Value Status    MAGNESIUM (MG) 1.9 Final   PHOSPHORUS (PO4)     Status: None   Result Value Status    PHOSPHORUS (PO4) 4.7 Final   INR     Status: Abnormal   Result Value Status    INR 2.65 (H) Final   CBC WITH DIFFERENTIAL     Status: Abnormal   Result Value Status    WHITE BLOOD CELL COUNT 4.2 (L) Final    RED CELL COUNT 4.15 Final    HEMOGLOBIN 9.0 (L) Final    HEMATOCRIT 29.3 (L) Final    MCV 70.6 (L) Final    MCH 21.6 (L) Final    MCHC 30.7 (L) Final    RDW 19.1 (H) Final    MPV 7.1 Final    PLATELET COUNT 291 Final    NEUTROPHILS % AUTO 47.2 Final    LYMPHOCYTES % AUTO 38.3 Final    MONOCYTES % AUTO 7.3 Final    EOSINOPHIL % AUTO 6.5 Final    BASOPHILS % AUTO 0.7 Final    NEUTROPHIL ABS AUTO 2.00 Final    LYMPHOCYTE ABS AUTO 1.6 Final    MONOCYTES ABS AUTO 0.3 Final    EOSINOPHIL ABS AUTO 0.3 Final    BASOPHILS ABS AUTO 0 Final     INFLUENZA A Negative  Negative  Final     INFLUENZA B Negative  Negative  Final    RESPIRATORY SYNCYTIAL VIRUS Negative  Negative  Final    PARAINFLUENZA Negative  Negative  Final    CORONAVIRUS Negative  Negative  Final    HUMAN METAPNEUMOVIRUS Negative  Negative  Final    RHINOVIRUS/ENTEROVIRUS POSITIVE (Abnl) Negative  Final    ADENOVIRUS Negative  Negative  Final    HUMAN BOCAVIRUS Negative  Negative  Final    CHLAMYDOPHILA PNEUMONIAE Negative  Negative  Final     MYCOPLASMA PNEUMONIAE Negative  Negative  Final    Comment:       Xray left foot:  Flattening and fragmentation of the navicular bone, compatible with  subacute fracture. Regions of  sclerosis within the navicular bone likely  reflect osteonecrosis.  Old healed fracture of the proximal fifth metatarsal.    CXR 11/06/15  IMPRESSION:  STABLE CARDIOMEGALY. CONSIDER PERICARDIAL EFFUSION.  MILD INCREASE IN INTERSTITIAL MARKINGS, ESPECIALLY IN THE LEFT PERIHILAR  REGION AND LUNG BASE. THE FINDINGS ARE COMPATIBLE WITH PULMONARY EDEMA BUT A BILATERAL INFECTIOUS PROCESS COULD HAVE A SIMILAR APPEARANCE    ASSESSMENT & PLAN:      50 year old female past medical history of DM-2, CKD-4, PE on warfarin, dCHF, HTN with CHF exacerbation and subacute vitreal hemorrhage requiring ophtho retinal surgery which occurred but delayed due to supratherapeutic INR when admitted.    # Syncope  Syncopal episode 6/13 while ambulating in the hallways.  On exam with generalized diffuse weakness non-focal exam, glucose normal and vital signs remained normal.  CT Head negative for acute trauma.  Although there is suspicion from members of caregiving team that there may be secondary gain as similar episodes have occurred upon discussion of placement options (at prior institutions per report), her rapid recovery is also consistent with syncope and did receive 2 doses of PO lasix on prior night.  - declined orthostatic evaluation  - continue telemetry monitoring, as per huddle discussion this may help elucidate any secondary gain with repeat event  - has recent echo from 5/21    # Glaucoma  # Vitreal hemorrhage with increased IOP s/p urgent retinal surgery  # Diabetic retinopathy  # Right eye blindness  Transferred to [place] for urgent retinal surgery for elevated IOP but procedure delayed due to supratherapeutic INR and vitrious hemorrage. S/p vitrectomy and laser photocoagulatuion by [place] [facility] on 5/25.  - appreciate ophthal recs, last seen 6/12  -  continue bromonidine gtt both eyes TID  - maxitrol R eye bid x 2 weeks (6/4-6/18)  - lubricant R eye qid prn  - [facility]                          ([facility]    # HTN  Persistently hypertensive. Already on hydralazine 100mg  TID, amlodipine 10mg  daily, and lasix 80mg  daily. Started on coreg, uptitrating  - Coreg 25 mg bid in addition to above    #AKI on CKD  # Acute on chronic heart failure, diastolic  Baseline Cr 2.3, presented with Cr 3.5 which rose with diuresis.  Was given IVF with minimal improvement. Cr stable 4.09 (peak 4.18) on discharge. Has symmetric 1-2+ pitting edema of lower extremities- improving. BNP improved from 5/20, thus unlikely 2/2 cardiorenal.  - Will attempt for better BP control, uptitrating coreg.   - renally dose medications and avoid nephrotoxin  - continue lasix 80mg  po daily   - monitor response in Cr closely  - Fluid restriction, daily standing weight  - outpatient referral nephrology for management    # Anemia  Iron studies suggestive of anemia of chronic disease.  Hgb stable 8-9 while in house.    # Cough  # rhinorhea  Patient with rhinovirus but has been in-house for 16 days, no longer having significant secretions.   - continue guaifenisin cough syrup PRN  - d/c isolation precautions    # DM2  # Peripheral neuropathy  # Retinopathy  A1c 7.6% in May 2017. Gabapentin reduced to 100mg  TID given renal function.  Lantus added 6/1  - continue lantus  8u QHS + SSI with meals    # PE (saddle embolus) status post IVC filter  # Supratherapeutic INR  The PE was diagnosed 3 years ago at [facility] with IVC placed with plan for lifelong AC given unprovoked and severity. Feb V/Q without evidence of PE. Was on coumadin 20mg  daily resulting in supratherapeutic INR on presentation here. Now with therapeutic INR.  DOAC n/a given worsening CKD.  - [facility] pharmacy to follow  - warfarin  daily, f/u coumadin clinic on discharge    # Left foot fracture  Recent Charcot's neuropathic osteoarthropathy left foot versus neuropathic fracture of the navicular bone and fifth metatarsal. Seen by ortho with worsening osteonecrosis on xray.  - Patient must wear orthotics (CAM boot) boot when ambulating.   - Patient will need follow up ortho outpt ~1wk following discharge.    # FEN: renal/diabetic diet  # DVT Prophylaxis: Coumadin  # Code status: Full    # Dispo: Medically stable for discharge back to SNF / B&C if remains stable overnight on telemetry    Report Electronically Signed By:    Kennith Maes, MD  Associate Physician - Internal Medicine  Section of [facility]  [facility]   Pager # [phone]  PI # [phone]

## 2015-11-16 NOTE — Nurse Focus (Signed)
Pt being rude, angry, and verbally abusive after [facility].

## 2015-11-16 NOTE — Allied Health Progress (Signed)
Occupational Therapy Progress Note     Date of Service: 11/16/2015  Time in: 1500  Total time: 40 Minutes    Treatment session  out of 10 in this reporting period.    S: Pt. Reports pain 4/10 in left foot. RN clears for tx.    O: Pt. Presents walking in hallway with RN.  Transfers: SBA mobility back to room with left foot in CAM boot with FWW for support.  SBA sit to stand transfers from bed side chair with FWW.  LB dressing: Independent don/doff CAM while seated in bedside chair. Independent don/doff sock over right foot.  Grooming/hygiene: SBA standing at sink side for hand washing task. Pt defers further self care tasks at sink side.   Pt reports she feels "sluggish, because she has not had her iron transfused." Notified RN of patient complaint. Pt reports feeling she has been treated poorly in this hospital, consoled and provided listening ear for patient.  Pt returned to bed side with all needs in reach at end of session, RN aware.     A: Pt progressing well with mobility tasks and self care tasks at this time. Would benefit from 1-2 more sessions to work on transfers and balance with standing tasks.     P: Continue OT plan of care while house, or defer to home health therapy for environment set up if to be discharged.     Reported by:  Carlis AbbottErica Rhylie Stehr, COTA/L  Certified Occupational Therapy Assistant  PI # [phone]  PM&R Department  [employer] [phone]

## 2015-11-16 NOTE — Allied Health Progress (Addendum)
Intensive Case Management Follow Up Note    Comments: CM spoke with Alben Spittle, [place], [phone], who reported the room and board operator that came to meet pt last night, Ben, has rejected the pt for short term placement.  Per Lattie Haw, she spoke with pt yesterday about having Suezanne Jacquet assess pt for 8 days of placement in his facility, and then pt would transition to [facility] room and board, once La[facility]na Peace returns from vacation, as pt has expressed ultimately she wants to go to [place].  Pt wanted to meet with Suezanne Jacquet first, which occurred last night.  Per Manon Hilding expressed that pt presented as paranoid, wanting to know why he was trying to take pt for short term placement when pt stated she wanted to go to [place].  Therefore, Suezanne Jacquet reported he will NOT take pt.  Furthermore, pt refusing to go to [place] with Suezanne Jacquet and refusing to pay him for short term placement.      CM asked Lattie Haw to clarify with Laverna Peace if he is still willing to take pt when he returns from vacation, given the issues the pt has presented with.        CM reviewed PT note from 6/15, as again, PATIENT WILL NEED TO BE INDEPENDENT WITH HER ADL'S PRIOR TO D/C TO ROOM AND BOARD.  Cm will note the following:  For GAIT:   -stand-by assist <> contact-guard assist with fww 66f x 2 from room into hallway.Nursing following behind with wheelchair.  -Patient gaits with step to gait due to L CAM boot. Patient has slow methodic gait pacing and needs cues for upright posture.   -Patient with c/o of dizziness with short seated rest break in wheelchair in hallway. Patient with improved descent and hand placement on wheelchair.   -sit>stand from wheelchair with fww cues for hand placement and contact-guard assist.  -patient taking small 20-30 sec standing rest breaks during gait back into room.   -patient with slow descent onto chair post treatment.   For TRANSFERS:  - Cues for slow descent and improved safety awareness.   [facility].   OVERALL:   -Out of bed to chair via stand pivot transfer with fww.  Ambulate with physical therapy using fww and CAM boot on LLE      CM spoke with CCM Supervisor VIsaac Laudre: pt and challenging d/c pt has been, behaviors, etc.  CM directed to pursue placement of pt with [facility] upon his return from vacation, and cm to [facility] and [facility]  If pt's DME will be too challenging to pursue, then ok to 799 the DME.        CM met with pt and pt's bedside RN, Josh, at bedside.  Pt questioned why the RN needed to be present, and cm informed pt she would like RN present, as a witness, as there have been pieces of information that have been misrepresented and misconstrued by the pt and therefore for a witness he is here.  CM also wants to speak with pt about mobilizing in and out of bed, as this is important for pt to transition out of the hospital back to the community, and nursing will be assisting pt with ambulation.  Pt spoke for quite a time about wanting to go to [facility], once he returns from his vacation, and how she had already told this cm, so she  is not sure why CM even had anybody else come and see her last night.  Pt was a bit all over the place, presented as being victimized, at times, by the hospital, by [facility], by the SNF she was in, etc, and how she wasn't going to tolerate any of this as she is not "uneducated."  Pt confirmed she has her LVN and she is educated and knows that this cm and staff should not treat her this way.  CM will note voice never raised, never spoke to pt in any kind of tone or derogatory manner, etc, yet pt maintained she would not tolerate being treated the way she was.  Pt relayed that she is being told by others "that is above my pay grade" and that she has been going through too much in her life to be dealing with the hospital and staff treating her poorly.  Pt made statements of generalizations that were not even said to her by  this cm, stated the cm was yelling at her when she was not, etc.  Pt complaining of Alben Spittle and her conversations with her, insisting Lattie Haw demanded money from her, etc etc.  Pt reported if hospital insists she leaves then she will just go and will just need her meds and then the meds explained to her so she can get her meds at [facility].  CM asked pt if CM could speak, as cm unable to speak this entire time.  Pt agreed.      CM informed pt, just as she did 2 days prior, that the R [facility] B that was available for pt with [facility] was only available the day the pt had a syncope episode and therefore pt could not leave, and now [facility] is out of the country and doesn't want to accept pt while he is gone.  Furthermore, CM informed pt that Alben Spittle was directed to look for another R & B, in interim of pt going to [facility] when he returns from vacation, as pt does NOT meet criteria to be in hospital and therefore pt could go to another R & B or go to another R & B, pending transitioning to [facility] when he returns, but is now refusing.  Furthermore, pt has rejected one SNF [place] found for pt, and therefore [facility] isn't trying to locate SNF any longer and has closed the pt's case.  Pt informed she isn't meeting criteria for hospitalization and therefore CM's job is to try and locate placement, since pt doesn't have a home to return to.  CM informed pt that Ivar Bury wanted to send pt to a B & C that was marginal, and thus CM kept that from happening.  Pt informed she refused multiple R & B's as well, due to personal experiences, etc.  CM relayed that this cm asked Lattie Haw, as this cm told pt she would do so 2 days ago, to continue to look for R & B and if any interested then they are to assess pt.  Pt informed Suezanne Jacquet came last night and met with pt.  Pt refused to go citing she wants to go to [facility].  Ben declined pt for placement as well.  CM informed pt and reminded her AGAIN, she must be independent to go to R & B and that nobody is  there to help pt.  She will need to do everything on her own, which pt stated she understood.  Then, out of nowhere, pt posed quite an attitude,  raised her voice and stated she was done talking to this cm and stated she wasn't going to talk to this CM anymore.  Pt insisted pt does NOT need or want a BSC, but does want a FWW.    CM left room.      CM called [facility], [place], [phone], & unable to leave vm re: FWW.  Therefore, CM called pt's RN CM for outpatient svs, Lucianne Lei, [phone], re: FWW.  She stated pt hasn't established care with PCP and therefore any DME cannot be coordinated until d/c.  But for [facility] she will place call and see if [facility] can get set up.  She asked CM to keep her apprised if R & B will take her, as HH will need address. Also, pt has a f/u MD appt for Tuesday, 6/20, but pt won't be out of hospital so she will re-schedule.  Pt has a Nephrology appt set for 6/26  She can assist with transport once d/c date confirmed and d/c address confirmed.  Will be in touch.      @ 16:08 CM received call from [facility], ph: [phone]/ main line [phone], who reported she will try to obtain [facility] services for pt for RN and PT.  She will keep CM apprised if services are confirmed, and she asked CM to confirm with her if d/c to R & B is confirmed, along with date.        Plan:  1.  CM to 799 authorize FWW for pt from distribution.      2.  Plan for d/c to [place], once he returns from his vacation, and pending his acceptance.  [facility], [employer], [place], [place], [place] [address]. [place], [phone] to arrange placement, once Laverna Peace returns from vacation next week.    3.  Continue to work with Ivar Bury for [facility] for pt for PT and wound care; contacts are RN CM for outpatient svs, Lucianne Lei, [phone]) [phone]/ [employer] [employer], ph: [phone]) [phone]/ main line [phone].    4.  Confirm with RN that pt is has been taught wound care to foot  ulcer & pt is INDEPENDENT with ADL's and mobility.        Date: 11/16/2015 Time: 08:57  Christy Gentles, MSW Intensive Clinical Case Manager/ Discharge Planner  [phone], [phone]

## 2015-11-17 LAB — BASIC METABOLIC PANEL
CALCIUM: 8.7 mg/dL (ref 8.6–10.5)
CARBON DIOXIDE TOTAL: 21 meq/L — AB (ref 24–32)
CHLORIDE: 104 meq/L (ref 95–110)
CREATININE BLOOD: 4.22 mg/dL — AB (ref 0.44–1.27)
E-GFR, AFRICAN AMERICAN: 14 SEE NOTE — AB (ref >60)
E-GFR, NON-AFRICAN AMERICAN: 12 — AB (ref >60)
GLUCOSE: 202 mg/dL — AB (ref 70–99)
POTASSIUM: 4.7 meq/L (ref 3.3–5.0)
SODIUM: 135 meq/L (ref 135–145)
UREA NITROGEN, BLOOD (BUN): 75 mg/dL — AB (ref 8–22)

## 2015-11-17 LAB — CBC WITH DIFFERENTIAL
BASOPHILS % AUTO: 0.6 %
BASOPHILS ABS AUTO: 0 10*3/uL (ref 0–0.2)
EOSINOPHIL % AUTO: 7.7 %
EOSINOPHIL ABS AUTO: 0.3 10*3/uL (ref 0–0.5)
HEMATOCRIT: 31.6 % — AB (ref 34–46)
HEMOGLOBIN: 9.8 g/dL — AB (ref 12.0–16.0)
LYMPHOCYTE ABS AUTO: 1.5 10*3/uL (ref 1.0–4.8)
LYMPHOCYTES % AUTO: 35.9 %
MCH: 22 pg — AB (ref 27–33)
MCHC: 31.1 % — AB (ref 32–36)
MCV: 70.7 UM3 — AB (ref 80–100)
MONOCYTES % AUTO: 6 %
MONOCYTES ABS AUTO: 0.3 10*3/uL (ref 0.1–0.8)
MPV: 7.2 UM3 (ref 6.8–10.0)
NEUTROPHIL ABS AUTO: 2.1 10*3/uL (ref 1.80–7.70)
NEUTROPHILS % AUTO: 49.8 %
PLATELET COUNT: 293 10*3/uL (ref 130–400)
RDW: 19.3 U — AB (ref 0–14.7)
RED CELL COUNT: 4.47 10*6/uL (ref 3.7–5.5)
WHITE BLOOD CELL COUNT: 4.2 10*3/uL — AB (ref 4.5–11.0)

## 2015-11-17 LAB — INR: INR: 2.56 — ABNORMAL HIGH (ref 0.87–1.18)

## 2015-11-17 LAB — CULTURE URINE, BACTI

## 2015-11-17 LAB — PHOSPHORUS (PO4): PHOSPHORUS (PO4): 4.6 mg/dL (ref 2.4–5.0)

## 2015-11-17 LAB — MAGNESIUM (MG): MAGNESIUM (MG): 2 mg/dL (ref 1.5–2.6)

## 2015-11-17 MED ORDER — GUAIFENESIN 100 MG/5 ML ORAL LIQUID
400.0000 mg | ORAL | Status: DC | PRN
Start: 2015-11-17 — End: 2015-11-18

## 2015-11-17 MED ORDER — WARFARIN 2 MG TABLET
5.0000 mg | ORAL_TABLET | ORAL | Status: AC
Start: 2015-11-17 — End: 2015-11-17
  Administered 2015-11-17: 5 mg via ORAL
  Filled 2015-11-17: qty 1

## 2015-11-17 NOTE — Nurse Focus (Signed)
Pt more alert and this writer was a standby assist to bsc. Pt declines pain meds. Will continue to monitor.

## 2015-11-17 NOTE — Progress Notes (Signed)
INTERNAL MEDICINE DAILY PROGRESS NOTE  Date: 11/17/2015  Time:  17:42      SUMMARY:    50 year old woman with history of DM2, CKD4, PE, dCHF, HTN initially admitted with plan for vitrectomy but held off due to supratherapeutic INR and noted to have dCHF exacerbation and rhinovirus URI then subsequently had vitrectomy and now awaiting placement    INTERVAL HISTORY:    -no acute events overnight    SUBJECTIVE:    -feels well, no complaints, concerned about fluid restriction when it is hot outside and is thirsty    MEDICATIONS:  Scheduled Medications  Acetaminophen (TYLENOL) Tablet 1,000 mg, ORAL, Q8H  Amlodipine (NORVASC) Tablet 10 mg, ORAL, QAM  Atorvastatin (LIPITOR) Tablet 80 mg, ORAL, Daily Bedtime  Brimonidine (ALPHAGAN) 0.2 % Ophthalmic Solution 1 drop, BOTH Eyes, TID  Calcium Acetate (PHOSLO) Capsule 1,334 mg, ORAL, TID w/ meals  Carvedilol (COREG) Tablet 25 mg, ORAL, BID w/ meals  Docusate (COLACE) Capsule 200 mg, ORAL, BID  FUROsemide (LASIX) Tablet 80 mg, ORAL, QAM  Gabapentin (NEURONTIN) Capsule 100 mg, ORAL, TID  HydrALAZINE (APRESOLINE) Tablet 100 mg, ORAL, TID  Insulin Aspart (NOVOLOG) Injection Pen 1-4 Units, SUBCUTANEOUS, Daily Bedtime  Insulin Aspart (NOVOLOG) Injection Pen 1-6 Units, SUBCUTANEOUS, TID w/ meals - correction dose  Insulin Glargine (LANTUS) Injection 8 Units, SUBCUTANEOUS, Daily Bedtime  Lactobacillus (CULTURELLE) Capsule 1 capsule, ORAL, QAM  Lidocaine (LIDODERM) 5 %(700 mg/patch) Patch 2 patch, Transdermal, Q24H Now  Lidocaine patch REMOVAL 1 patch, Transdermal, Q24H Now  Neomycin/Polymyxin/Dexamethasone (MAXITROL) Ophthalmic Ointment 0.5 inch, RIGHT Eye, Q12H Now  Polyethylene Glycol 3350 (MIRALAX) Oral Powder Packet 17 g, ORAL, QAM  Sennosides (SENOKOT) Tablet 17.2 mg, ORAL, Daily Bedtime  Warfarin Patient Flag, NOT APPLICABLE, PATIENT FLAG ONLY    IV Medications   PRN Medications  Albuterol (PROAIR HFA, PROVENTIL HFA, VENTOLIN HFA) Inhaler 1-2 puff, INHALATION, Q4H PRN  Bisacodyl  (DULCOLAX) Suppository 10 mg, RECTALLY, Q24H PRN  Camphor/Menthol (SARNA) Lotion, TOPICAL, BID prn  Dextrose 50% Injection 12.5 g, IV, PRN  Dextrose 50% Injection 25 g, IV, PRN  Glucagon Injection 1 mg, IM, PRN  Guaifenesin -Dextromethorphan /44ml (ROBITUSSIN DM) Syrup 10 mL, ORAL, Q4H PRN  HydrALAZINE (APRESOLINE) Injection 10 mg, IV, Q4H PRN  Hydromorphone (DILAUDID) Tablet 2 mg, ORAL, Q4H PRN  Mineral Oil (FLEET) Enema 1 enema, RECTALLY, Bedtime PRN  Ocular Lubricant Ophthalmic Ointment 0.25 inch, RIGHT Eye, Q6H PRN  Ondansetron (ZOFRAN) Injection 4 mg, IV, Q8H PRN  Promethazine (PHENERGAN) Injection 12.5 mg, IV, Q6H PRN    OBJECTIVE:   Vital Signs  Summary  Temp Min: 36.4 C (97.5 F) Max: 36.9 C (98.5 F)  BP: (125-147)/(78-83)   Pulse Min: 66 Max: 76  Resp Min: 18 Max: 18  SpO2 Min: 99 % Max: 100 %      Current Vitals  Temp: 36.4 C (97.5 F)  BP: 133/81  Pulse: 74  Resp: 18  SpO2: 100 %      Weight: 101.3 kg (223 lb 5.2 oz)     Intake and Output  Last Two Completed Shifts  In: 990 [Oral:990]  Out: 1700 [Urine:1700]    Current Shift  In: 120 [Oral:120]  Out: 800 [Urine:800]    Physical Exam  General Appearance: alert, no distress  Eyes: PERRL, EOMI, sclerae normal  Mouth: normal  Neck: supple, no adenopathy   Heart: normal rate and regular rhythm, no murmurs, clicks, or gallops  Lungs: clear to auscultation  Abdomen: BS normal, soft, NT/ND  Extremities: RLE 1+ and LLE trace edema and distal pulses normal  Neuro: CN II-XII intact, sensation and strength grossly normal  Skin: no rashes or lesions    LINES AND DRAINS: PIV    LAB TESTS/STUDIES:   I personally reviewed the following labs, images, reports  Recent labs for the past 72 hours     11/17/15 0741 11/16/15 0654 11/15/15 0814    GLUCOSE 202* 163* 198*    UREA NITROGEN, BLOOD (BUN) 75* 70* 66*    CREATININE BLOOD 4.22* 4.01* 3.90*    SODIUM 135 136 136    POTASSIUM 4.7 4.9 4.7    CHLORIDE 104 107 104    CARBON DIOXIDE TOTAL 21* 22*  23*    CALCIUM 8.7 8.5* 8.5*    PROTEIN -- -- --    ALBUMIN -- -- --    BILIRUBIN TOTAL -- -- --    ALKALINE PHOSPHATASE (ALP) -- -- --    ASPARTATE TRANSAMINASE (AST) -- -- --    ALANINE TRANSFERASE (ALT) -- -- --        Recent labs for the past 72 hours     11/17/15 0741 11/16/15 0654 11/15/15 0814    WHITE BLOOD CELL COUNT 4.2* 4.2* 4.3*    HEMOGLOBIN 9.8* 9.0* 9.3*    HEMATOCRIT 31.6* 29.3* 30.1*    PLATELET COUNT 293 291 282     Recent labs for the past 72 hours     11/17/15 0741 11/16/15 0654 11/15/15 0814    INR 2.56* 2.65* 2.85*      Lab Results   Lab Name Value Date/Time    HGBA1C 7.6 (H) 10/21/2015 08:06 AM       Micro:  Blood culture - negative  RVP - Rhinovirus  5/26 urine culture - 30K Proteus mirabilis  6/7 urine culture - mixed flora/no significant growth  6/14 urine culture - Proteus mirabilis    Radiology:  No new imaging    ASSESSMENT & PLAN:      Syncope  Syncopal episode 6/13 while ambulating in the hallways.  On exam with generalized diffuse weakness non-focal exam, glucose normal and vital signs remained normal.  CT Head negative for acute trauma.  Although there is suspicion from members of caregiving team that there may be secondary gain as similar episodes have occurred upon discussion of placement options (at prior institutions per report), her rapid recovery is also consistent with syncope and did receive 2 doses of PO lasix on prior night.  Declined orthostatic evaluation.  Has recent TTE  -d/c tele given lack of events  -monitor    AKI on CKD  Acute on chronic dCHF  Baseline Cr 2.3, presented with Cr 3.5 which rose with diuresis. Was given IVF with minimal improvement. Cr stable around 4  -monitor renal function  -renally dose meds and avoid nephrotoxins  -if creatinine continues to rise then hold lasix  -fuid restriction, daily weight  -outpatient nephrology referral    Glaucoma  Vitreal hemorrhage with increased IOP s/p vitrectomy for ghost cell glaucoma  Diabetic  retinopathy  Right eye blindness  Transferred to [facility] for urgent retinal surgery for elevated IOP but procedure delayed due to supratherapeutic INR and vitrious hemorrage. S/p vitrectomy and laser photocoagulatuion by [place] [facility] on 5/25.  -follow up ophtho recs  -bromonidine, maxitrol (6/4-6/18), lubricant prn  -follow up 3 weeks as outpatient at [facility] or [place] per patient preference (schedule with Dr. Willaim Bane at [facility] or Dr. Olevia Bowens at [place])  Asymptomatic bacteruria - UA minimally positive and no urinary symptoms but urine culture with Proteus, will not treat given lack of symptoms  HTN - persistently hypertensive so added on carvedilol in addition to hydralazine, amlodipine, lasix with improvement  Anemia - iron studies suggestive of anemia of chronic disease, hemoglobin stable  Rhinovirus infection - resolved, guaifenesin prn  DM2 c/b neuropathy, retinopathy - A1c 7.6, gabapentin dose reduced for renal function, lantus, ISS  PE (saddle embolus) status post IVC filter - [facility] with IVC placed with plan for lifelong AC given unprovoked and severity. Feb V/Q without evidence of PE. Was on coumadin 20mg  daily resulting in supratherapeutic INR on presentation. Now with therapeutic INR. DOAC n/a given worsening CKD.  Warfarin per clot pharmacy, monitor INR  Left foot fracture - recent Charcot's neuropathic osteoarthropathy left foot versus neuropathic fracture of the navicular bone and fifth metatarsal. Seen by ortho with worsening osteonecrosis on xray.  Must wear orthotics (CAM boot) boot when ambulating.  Patient will need follow up ortho outpt ~1wk following discharge.    FEN: renal/diabetic diet  DVT prophylaxis: warfarin  Dispo: ICM, B&C when available    Report electronically signed by:  Sanjuana KavaShaheen T Gared Gillie, MD  Internal Medicine Attending  Pager: [phone]

## 2015-11-17 NOTE — Nurse Assessment (Signed)
ASSESSMENT NOTE  Late entry   Initial assessment completed and recorded in EMR.  Report received from day shift nurse and orders reviewed. Vss, patient denies pain at this time and is resting in bed with no current signs of distress. Plan of Care reviewed and appropriate, discussed with patient. Mayo AoMichael Jaki Steptoe, RN

## 2015-11-17 NOTE — Nurse Assessment (Signed)
ASSESSMENT NOTE    Note Started: 11/17/2015, 07:02     Initial assessment completed and recorded in EMR.  Report received from night shift nurse and orders reviewed. Patient reports pain of the r.eye.   The plan for the day is pain management.    Plan of Care reviewed and appropriate, discussed with patient.   Leslie AbideEllen Addysyn Fern, RN RN

## 2015-11-17 NOTE — Plan of Care (Signed)
Problem: Patient Care Overview (Adult)  Goal: Plan of Care Review  Outcome: Ongoing (interventions implemented as appropriate)  Goal Outcome Evaluation Note     Leslie Hernandez is a [age]yr female admitted 10/20/2015      OUTCOME SUMMARY AND PLAN MOVING FORWARD:   Vss with no acute changes. Pt able to turn and reposition herself in bed. Assisted pt to bedside commode with assist x2. Pt currently resting in bed with no signs of distress. Will continue to monitor pt.   Goal: Individualization and Mutuality  Outcome: Ongoing (interventions implemented as appropriate)  Goal: Discharge Needs Assessment  Outcome: Ongoing (interventions implemented as appropriate)    Problem: Sleep Pattern Disturbance (Adult)  Goal: Adequate Sleep/Rest  Patient will demonstrate the desired outcomes by discharge/transition of care.   Outcome: Ongoing (interventions implemented as appropriate)    Problem: Mobility, Physical Impaired (Adult)  Goal: Enhanced Mobility Skills  Patient will demonstrate the desired outcomes by discharge/transition of care.   Outcome: Ongoing (interventions implemented as appropriate)  Goal: Enhanced Functionality Ability  Patient will demonstrate the desired outcomes by discharge/transition of care.   Outcome: Ongoing (interventions implemented as appropriate)    Problem: Fluid Volume Excess (Adult,Obstetrics,Pediatric)  Goal: Stable Weight  Patient will demonstrate the desired outcomes by discharge/transition of care.   Outcome: Ongoing (interventions implemented as appropriate)  Goal: Balanced Intake/Output  Patient will demonstrate the desired outcomes by discharge/transition of care.   Outcome: Ongoing (interventions implemented as appropriate)    Problem: Pressure Ulcer Risk (Braden Scale) (Adult,Obstetrics,Pediatric)  Goal: Skin Integrity  Patient will demonstrate the desired outcomes by discharge/transition of care.   Outcome: Ongoing (interventions implemented as appropriate)

## 2015-11-17 NOTE — Nurse Assessment (Addendum)
ASSESSMENT NOTE    Note Started: 11/17/2015, 20:04     Initial assessment completed and recorded in EMR.  Report received from day shift nurse and orders reviewed. Patient reports 7/10 r eye pain. Pt has been extremely sleepy arousing very briefly to voice. Vss, pt is asleep with no current signs of distress at this time. Will continue to monitor pt. Pt not on tele, med surg orders only. Plan of Care reviewed and appropriate, discussed with patient.  Mayo AoMichael Fahad Cisse, RN

## 2015-11-18 DIAGNOSIS — B9789 Other viral agents as the cause of diseases classified elsewhere: Secondary | ICD-10-CM

## 2015-11-18 DIAGNOSIS — R8271 Bacteriuria: Secondary | ICD-10-CM

## 2015-11-18 LAB — CBC WITH DIFFERENTIAL
BASOPHILS % AUTO: 0.9 %
BASOPHILS ABS AUTO: 0 10*3/uL (ref 0–0.2)
EOSINOPHIL % AUTO: 7.2 %
EOSINOPHIL ABS AUTO: 0.3 10*3/uL (ref 0–0.5)
HEMATOCRIT: 30 % — AB (ref 34–46)
HEMOGLOBIN: 9.4 g/dL — AB (ref 12.0–16.0)
LYMPHOCYTE ABS AUTO: 1.6 10*3/uL (ref 1.0–4.8)
LYMPHOCYTES % AUTO: 37.6 %
MCH: 22.2 pg — AB (ref 27–33)
MCHC: 31.3 % — AB (ref 32–36)
MCV: 70.8 UM3 — AB (ref 80–100)
MONOCYTES % AUTO: 6.4 %
MONOCYTES ABS AUTO: 0.3 10*3/uL (ref 0.1–0.8)
MPV: 7.2 UM3 (ref 6.8–10.0)
NEUTROPHIL ABS AUTO: 2.1 10*3/uL (ref 1.80–7.70)
NEUTROPHILS % AUTO: 47.9 %
PLATELET COUNT: 265 10*3/uL (ref 130–400)
RDW: 19.4 U — AB (ref 0–14.7)
RED CELL COUNT: 4.24 10*6/uL (ref 3.7–5.5)
WHITE BLOOD CELL COUNT: 4.3 10*3/uL — AB (ref 4.5–11.0)

## 2015-11-18 LAB — BASIC METABOLIC PANEL
Calcium: 8.6 mg/dL (ref 8.6–10.5)
Carbon Dioxide Total: 22 meq/L — ABNORMAL LOW (ref 24–32)
Chloride: 107 meq/L (ref 95–110)
Creatinine Blood: 4.04 mg/dL — ABNORMAL HIGH (ref 0.44–1.27)
E-GFR, African American: 14 SEE NOTE — ABNORMAL LOW (ref >60)
E-GFR, Non-African American: 12 — ABNORMAL LOW (ref >60)
Glucose: 187 mg/dL — ABNORMAL HIGH (ref 70–99)
Potassium: 4.6 meq/L (ref 3.3–5.0)
Sodium: 137 meq/L (ref 135–145)
Urea Nitrogen, Blood (BUN): 82 mg/dL — ABNORMAL HIGH (ref 8–22)

## 2015-11-18 LAB — PHOSPHORUS (PO4): Phosphorus (PO4): 4.7 mg/dL (ref 2.4–5.0)

## 2015-11-18 LAB — MAGNESIUM (MG): MAGNESIUM (MG): 2.1 mg/dL (ref 1.5–2.6)

## 2015-11-18 LAB — B-TYPE NATRIURETIC PEPTIDE: B-TYPE NATRIURETIC PEPTIDE: 89 pg/mL (ref 0–100)

## 2015-11-18 LAB — INR: INR: 2.6 — ABNORMAL HIGH (ref 0.87–1.18)

## 2015-11-18 MED ORDER — WARFARIN 10 MG TABLET
5.0000 mg | ORAL_TABLET | Freq: Every day | ORAL | 0 refills | Status: DC
Start: 2015-11-18 — End: 2015-11-18

## 2015-11-18 MED ORDER — CALCIUM 600 MG (AS CARBONATE)-VITAMIN D3 10 MCG (400 UNIT) TABLET
1.0000 | ORAL_TABLET | Freq: Two times a day (BID) | ORAL | 3 refills | Status: AC
Start: 2015-11-18 — End: 2015-12-18

## 2015-11-18 MED ORDER — INSULIN GLARGINE 100 UNIT/ML SUB-Q VIAL
8.0000 [IU] | Freq: Every day | SUBCUTANEOUS | 11 refills | Status: DC
Start: 2015-11-18 — End: 2015-11-18

## 2015-11-18 MED ORDER — FUROSEMIDE 40 MG TABLET
80.0000 mg | ORAL_TABLET | Freq: Every day | ORAL | 0 refills | Status: AC
Start: 2015-11-18 — End: 2015-12-18

## 2015-11-18 MED ORDER — AMLODIPINE 10 MG TABLET
1.0000 | ORAL_TABLET | Freq: Every day | ORAL | 0 refills | Status: AC
Start: 2015-11-18 — End: 2015-12-18

## 2015-11-18 MED ORDER — DOCUSATE SODIUM 100 MG CAPSULE
200.0000 mg | ORAL_CAPSULE | Freq: Two times a day (BID) | ORAL | 11 refills | Status: DC
Start: 2015-11-18 — End: 2015-11-18

## 2015-11-18 MED ORDER — GABAPENTIN 300 MG CAPSULE
300.0000 mg | ORAL_CAPSULE | Freq: Three times a day (TID) | ORAL | 3 refills | Status: AC
Start: 2015-11-18 — End: 2015-12-18

## 2015-11-18 MED ORDER — CALCIUM ACETATE(PHOSPHATE BINDERS) 667 MG CAPSULE
667.0000 mg | ORAL_CAPSULE | Freq: Three times a day (TID) | ORAL | 0 refills | Status: AC
Start: 2015-11-18 — End: 2016-11-12

## 2015-11-18 MED ORDER — ACETAMINOPHEN 500 MG TABLET
1000.0000 mg | ORAL_TABLET | Freq: Three times a day (TID) | ORAL | 0 refills | Status: AC
Start: 2015-11-18 — End: 2016-11-12

## 2015-11-18 MED ORDER — DOCUSATE SODIUM 100 MG CAPSULE
200.0000 mg | ORAL_CAPSULE | Freq: Two times a day (BID) | ORAL | 3 refills | Status: AC
Start: 2015-11-18 — End: 2016-11-12

## 2015-11-18 MED ORDER — HYDRALAZINE 100 MG TABLET
1.0000 | ORAL_TABLET | Freq: Three times a day (TID) | ORAL | 0 refills | Status: AC
Start: 2015-11-18 — End: 2015-12-18

## 2015-11-18 MED ORDER — NEOMYCIN 3.5 MG/G-POLYMYXIN B 10,000 UNIT/G-DEXAMETH 0.1 % EYE OINT
0.5000 [in_us] | TOPICAL_OINTMENT | Freq: Two times a day (BID) | OPHTHALMIC | 1 refills | Status: DC
Start: 2015-11-18 — End: 2015-11-18

## 2015-11-18 MED ORDER — ALBUTEROL SULFATE HFA 90 MCG/ACTUATION AEROSOL INHALER
1.0000 | INHALATION_SPRAY | RESPIRATORY_TRACT | 3 refills | Status: DC | PRN
Start: 2015-11-18 — End: 2015-11-18

## 2015-11-18 MED ORDER — WARFARIN 5 MG TABLET
5.0000 mg | ORAL_TABLET | Freq: Every day | ORAL | 0 refills | Status: AC
Start: 2015-11-18 — End: 2015-12-18

## 2015-11-18 MED ORDER — GABAPENTIN 300 MG CAPSULE
300.0000 mg | ORAL_CAPSULE | Freq: Three times a day (TID) | ORAL | 11 refills | Status: DC
Start: 2015-11-18 — End: 2015-11-18

## 2015-11-18 MED ORDER — BRIMONIDINE 0.2 % EYE DROPS
1.0000 [drp] | Freq: Three times a day (TID) | OPHTHALMIC | 3 refills | Status: AC
Start: 2015-11-18 — End: 2016-11-12

## 2015-11-18 MED ORDER — CALCIUM 600 MG (AS CARBONATE)-VITAMIN D3 10 MCG (400 UNIT) TABLET
1.0000 | ORAL_TABLET | Freq: Two times a day (BID) | ORAL | 11 refills | Status: DC
Start: 2015-11-18 — End: 2015-11-18

## 2015-11-18 MED ORDER — ALBUTEROL SULFATE HFA 90 MCG/ACTUATION AEROSOL INHALER
1.0000 | INHALATION_SPRAY | RESPIRATORY_TRACT | 3 refills | Status: AC | PRN
Start: 2015-11-18 — End: 2016-11-12

## 2015-11-18 MED ORDER — INSULIN GLARGINE 100 UNIT/ML SUB-Q VIAL
8.0000 [IU] | Freq: Every day | SUBCUTANEOUS | 3 refills | Status: AC
Start: 2015-11-18 — End: 2016-11-12

## 2015-11-18 MED ORDER — CARVEDILOL 25 MG TABLET
25.0000 mg | ORAL_TABLET | Freq: Two times a day (BID) | ORAL | 0 refills | Status: AC
Start: 2015-11-18 — End: 2015-12-18

## 2015-11-18 NOTE — Nurse Assessment (Signed)
ASSESSMENT NOTE    Note Started: 11/18/2015, 07:02     Initial assessment completed and recorded in EMR.  Report received from night shift nurse and orders reviewed. Patient resting comfortably, nad.  The plan for the day is ADL's, safety, and positive mood.   Plan of Care reviewed and appropriate, discussed with patient.   Kari BaarsJoshua Breccan Galant, RN.

## 2015-11-18 NOTE — Allied Health Progress (Signed)
Intensive Case Management Follow Up Note    Comments: CM received call from LATE Friday night from [facility], [facility], pt's outpatient RN that follows pt with [facility].  Per Zenaida NieceVan, CM should be receiving a call from [facility] (please see this cm's note from Friday indicating Daphne called this cm already).  Per Eric FormVan, Daphne is with [facility] and [facility] will be arranging [facility].  Per Eric FormVan, Daphne will need the following faxed to her at fax #: [phone]:  --H&P  --wound care notes  --PT notes  --D/C summary  --F/F certification and HH orders [facility] doctor    Per Hyman BowerVan, [facility] for [facility] can be reached at [phone].           Plan:  1.  CM to 799 authorize FWW for pt from distribution.      2.  Plan for [facility]/c to [facility], once he returns from his vacation, and pending his acceptance.  [facility] [employer] [employer], [address], [place], [place] [id number]. Contact Gwendolyn FillLisa Santos, [phone] to arrange placement, once Chanetta MarshallJimmy returns from vacation next week.    3.  Continue to work with [facility] for pt for PT and wound care; contacts are RN CM for outpatient svs, Zenaida NieceVan, [phone]/ Revonda StandardDaphne, [facility], ph: [phone]/ main line [phone].    4.  Confirm with RN that pt is has been taught wound care to foot ulcer & pt is INDEPENDENT with ADL's and mobility.          Date: 11/18/2015 Time: 09:13  Theotis BurrowKim Raylyn Carton, MSW Intensive Clinical Case Manager/ Discharge Planner  [phone], [phone]

## 2015-11-18 NOTE — Plan of Care (Addendum)
Problem: Patient Care Overview (Adult)  Goal: Plan of Care Review  Outcome: Ongoing (interventions implemented as appropriate)  Goal Outcome Evaluation Note     Leslie Hernandez is a [age]yr female admitted 10/20/2015      OUTCOME SUMMARY AND PLAN MOVING FORWARD:   Vss, assisted pt to bsc with standby assist x1. pt mentioned about talking to the doctor about going AMA and had an episode of crying, and this Clinical research associatewriter provided emotional support. Pt currently resting in bed with no signs of distress. Will continue to monitor pt.     Goal: Individualization and Mutuality  Outcome: Ongoing (interventions implemented as appropriate)  Goal: Discharge Needs Assessment  Outcome: Ongoing (interventions implemented as appropriate)    Problem: Sleep Pattern Disturbance (Adult)  Goal: Adequate Sleep/Rest  Patient will demonstrate the desired outcomes by discharge/transition of care.   Outcome: Ongoing (interventions implemented as appropriate)    Problem: Mobility, Physical Impaired (Adult)  Goal: Enhanced Mobility Skills  Patient will demonstrate the desired outcomes by discharge/transition of care.   Outcome: Ongoing (interventions implemented as appropriate)  Goal: Enhanced Functionality Ability  Patient will demonstrate the desired outcomes by discharge/transition of care.   Outcome: Ongoing (interventions implemented as appropriate)    Problem: Fluid Volume Excess (Adult,Obstetrics,Pediatric)  Goal: Balanced Intake/Output  Patient will demonstrate the desired outcomes by discharge/transition of care.   Outcome: Ongoing (interventions implemented as appropriate)    Problem: Pain, Acute (Adult)  Goal: Acceptable Pain Control/Comfort Level  Patient will demonstrate the desired outcomes by discharge/transition of care.   Outcome: Ongoing (interventions implemented as appropriate)

## 2015-11-18 NOTE — Allied Health Progress (Signed)
Date of Service : 11/18/2015    Clinical Case Management Weekend/Holiday Note    Comments:  WEEKEND CM received call from [facility] who stated that patient now wants to discharge to a motel and has declined the room and board. CM advised MD that as patient has [facility] insurance she will need to be provided with a written RX of her medications in order to have [facility].     Patient will be provided with a FWW out of distribution.     WEEKEND CM spoke with bedside RN, Sharia ReeveJosh who was apprised of the above information. Per Josh patient has arranged her own transportation through [employer] and has not provided the name of the motel as to where she will discharge to. He stated that per patient the motel room will not be ready until 1930 today.     Per assigned ICM note [facility] and [facility] Herbert[facility]aphne will be arranging [facility]. WEEKEND CM will fax the following to [facility] at fax #: [phone]:  --H&P  --wound care notes  --PT notes  --D/C summary  --F/F certification and HH orders from [facility] doctor.    Per MD Najafi there is no wound care consult. He will however complete the [facility] order and F/F certification.     Further follow up needed:   Await DC summary and HH orders/ F2F in order to [facility] to [facility].     @[id number] : Faxed documents as requested by [facility] CM Daphne. Please refer to bedside RN note as patient declined FWW as well as refused taking her L-foot brace/boot.      11/18/2015, 09:15      Electronically Signed by:    Milly JakobLisa Shavona Gunderman, LCSW  WEEKEND Case Manager  Pager: [phone]

## 2015-11-18 NOTE — Discharge Summary (Signed)
INTERNAL MEDICINE DISCHARGE SUMMARY  Date of Admission:   10/20/2015 Date of Discharge 11/18/2015   Admitting  Service: Hospital Medicine Faculty Service Discharging Service: Hospital Medicine Faculty Service   Attending Physician at Discharge: Sharlett IlesShaheen Norelle Runnion, MD        Discharge Diagnosis:  AKI on CKD  Acute on chronic dCHF  Glaucoma  Vitreal hemorrhage with increased IOP s/p pars plana vitrectomy and endolaser OD  Diabetic retinopathy  Right eye blindness  Syncope  Asymptomatic bacteruria  HTN  Microcytic anemia  Rhinovirus infection  DM2 c/b neuropathy  PE (saddle embolus) s/p IVC filter  Left foot fracture    Comments to Outpatient Physician:  Please follow up on:  -prescribing rest of meds and diabetes supplies  -volume status, renal function, lasix dosing  -ophtho follow up  -ortho follow up  -monitor INR and warfarin dosing    Medications at time of Discharge:  Discharge Medication List as of 11/18/2015 12:01 PM      CONTINUE these medications which have CHANGED    Details   Acetaminophen (TYLENOL) 500 mg Tablet Take 2 tablets by mouth every 8 hours., Disp-100 tablet, R-0, Normal      Albuterol (PROAIR HFA, PROVENTIL HFA, VENTOLIN HFA) 90 mcg/actuation inhaler Take 1-2 puffs by inhalation every 4 hours if needed for wheezing., Disp-8.5 g, R-3, Long-term, Pharmacy      Amlodipine (NORVASC) 10 mg Tablet Take 1 tablet by mouth every day. Indications: hypertension, Disp-30 tablet, R-0, Long-term, Normal      Brimonidine (ALPHAGAN) 0.2 % Ophthalmic Solution Instill 1 drop into EACH eye 3 times daily., Disp-10 mL, R-3, Long-term, Normal      Calcium Acetate (PHOSLO) 667 mg Capsule Take 1 capsule by mouth 3 times daily with meals., Disp-90 capsule, R-0, Long-term, Normal      Calcium-Cholecalciferol, D3, (CALCIUM 600 + D) 600 mg(1,500mg ) -400 unit Tablet Take 1 tablet by mouth 2 times daily., Disp-90 tablet, R-3, Long-term, Pharmacy      Carvedilol (COREG) 25 mg Tablet Take 1 tablet by mouth 2 times daily with meals.,  Disp-60 tablet, R-0, Long-term, Normal      Docusate (COLACE) 100 mg Capsule Take 2 capsules by mouth 2 times daily., Disp-60 capsule, R-3, Pharmacy      FUROsemide (LASIX) 40 mg Tablet Take 2 tablets by mouth every day., Disp-60 tablet, R-0, Long-term, Normal      Gabapentin (NEURONTIN) 300 mg Capsule Take 1 capsule by mouth 3 times daily., Disp-90 capsule, R-3, Long-term, Pharmacy      HydrALAZINE (APRESOLINE) 100 mg tablet Take 1 tablet by mouth 3 times daily. Indications: hypertension, 100 mg (1 tablet), Disp-90 tablet, R-0, Long-term, Normal      Insulin Glargine (LANTUS) 100 unit/mL Vial Inject 8 Units subcutaneously every day at bedtime., Disp-10 mL, R-3, Long-term, Pharmacy      Warfarin (COUMADIN) 5 mg Tablet Take 1 tablet by mouth every day., 5 mg, Disp-30 tablet, R-0, Long-term, Normal         CONTINUE these medications which have NOT CHANGED    Details   Atorvastatin (LIPITOR) 80 mg tablet Take 1 tablet by mouth every day.            , Long-term, Historical      Melatonin 3 mg Tablet Take 1 tablet by mouth every day at bedtime if needed for insomnia., Historical      Multivitamins with Minerals 1 tab tablet Take 1 tablet by mouth every day., Historical  STOP taking these medications       Bisacodyl (DULCOLAX) 10 mg Suppository Comments:   Reason for Stopping:         Cholecalciferol, Vitamin D3, (VITAMIN D-3) 400 unit Tablet Comments:   Reason for Stopping:         Dorzolamide 2%/Timolol 0.5% (COSOPT) Ophthalmic Solution Comments:   Reason for Stopping:         ferrous sulfate (FERRO-TIME) 325 mg (65 mg iron) Tablet Comments:   Reason for Stopping:         Insulin Lispro, Human, (HUMALOG KWIKPEN) 100 unit/mL Pen Comments:   Reason for Stopping:         INSULIN NPH HUMAN ISOPHANE (NPH INSULIN HUMAN RECOMB SC) Comments:   Reason for Stopping:         Insulin NPH Human Recomb (HUMULIN N KWIKPEN) 100 unit/mL (3 mL) Pen Comments:   Reason for Stopping:         Latanoprost (XALATAN) 0.005 % Ophthalmic  Solution Comments:   Reason for Stopping:         Magnesium Hydroxide (MILK OF MAGNESIA CONCENTRATED) 2,400 mg/10 mL Suspension Comments:   Reason for Stopping:         Meclizine (ANTIVERT) 12.5 mg Tablet Comments:   Reason for Stopping:         Neomycin/Polymyxin/Dexamethasone (MAXITROL) 3.5 mg/g-10,000 unit/g-0.1 % Ophthalmic Ointment Comments:   Reason for Stopping:         NUT.TX.GLUCOSE INTOLERANCE,SOY (GLUCERNA PO) Comments:   Reason for Stopping:         Ondansetron 4 mg Film Comments:   Reason for Stopping:         Prochlorperazine (COMPAZINE) 10 mg Tablet Comments:   Reason for Stopping:         Saccharomyces boulardii (FLORASTOR) 250 mg Capsule Comments:   Reason for Stopping:         Sennosides (SENNA) 8.6 mg Tablet Comments:   Reason for Stopping:         Sodium Phosphates (FLEET ENEMA) 19-7 gram/118 mL Enema Comments:   Reason for Stopping:         Tramadol (ULTRAM) 50 mg Tablet Comments:   Reason for Stopping:               Procedure(s) Performed:   Pars plana vitrectomy and endolaser OD (5/25)    Consultation(s):   OPHTHALMOLOGY CONSULT    Reason for Admission and Brief HPI:   50 year old woman with history of DM2, CKD4, PE on warfarin, dCHF, HTN, presenting with dyspnea.  She was in her usual state of health until about 1.5 weeks ago when she developed right eye spots/flutters that progressed to complete blindness.  About a week ago she developed a productive cough, nausea/vomiting, sweats, chills, rhinorrhea, and fatigue.  She had episodes of PND which would be relieved with supplemental O2 and associated with hypertension (200s/100s).  She also had decreased UOP, BLE edema, as well as constipation with her last BM 2 days prior to admission that required an enema.  She denies fevers, sick contacts, chest pain, abdominal pain, dysuria.     Hospital Course:     Syncope  Syncopal episode 6/13 while ambulating in the hallways. On exam with generalized diffuse weakness non-focal exam, glucose normal and  vital signs remained normal. CT Head negative for acute trauma. Although there is suspicion from members of caregiving team that there may be secondary gain as similar episodes have occurred upon discussion of placement  options (at prior institutions per report), her rapid recovery is also consistent with syncope and did receive 2 doses of PO lasix on prior night.  Declined orthostatic evaluation.  Has recent TTE.  Monitored on telemetry with no events.    AKI on CKD  Acute on chronic dCHF  Baseline Cr 2.3, presented with Cr 3.5 which rose with diuresis during stay up to around 4 where it was stable upon discharge. Was given IVF with minimal improvement.  Encouraged fluid restriction and low salt diet.  Due to see [facility] nephrologist as outpatient.  Continued on lasix.  Weight down to 221 lbs upon discharge.    Glaucoma  Vitreal hemorrhage with increased IOP s/p vitrectomy for ghost cell glaucoma  Diabetic retinopathy  Right eye blindness  Transferred to [place] for urgent retinal surgery for elevated IOP but procedure delayed due to supratherapeutic INR and vitrious hemorrage. S/p vitrectomy and laser photocoagulatuion by ophthomology on 5/25.  Continued on brimonidine and completed 2 weeks of maixtrol.  Encouraged to follow up with [facility] ophthalmologist.    Asymptomatic bacteruria - UA minimally positive and no urinary symptoms but urine culture with Proteus, did not treat given lack of symptoms  HTN - persistently hypertensive initially which may have been due in part to fluid overload, started on carvedilol in addition to hydralazine, amlodipine, lasix with improvement  Anemia - iron studies suggestive of anemia of chronic disease, hemoglobin stable  Rhinovirus infection - resolved, guaifenesin prn  DM2 c/b neuropathy, retinopathy - A1c 7.6, gabapentin dose reduced for renal function, continued on lantus during stay as well as ISS.  PE (saddle embolus) status post IVC filter - diagnosed 3 years ago at [facility] with IVC placed with plan for lifelong AC given unprovoked and severity. Feb V/Q without evidence of PE. Was on coumadin  daily resulting in supratherapeutic INR on presentation. Now with therapeutic INR. DOAC n/a given worsening CKD.  Discharged with warfarin 5 mg tablets per clot pharmacy recs and encouraged to follow up with anticoagulation clinic at [facility].  Left foot fracture - recent Charcot's neuropathic osteoarthropathy left foot versus neuropathic fracture of the navicular bone and fifth metatarsal. Seen by ortho with worsening osteonecrosis on xray.  Must wear orthotics (CAM boot) boot when ambulating.  Instructed her to follow up ortho outpt ~1wk following discharge.    Hospital stay was prolonged mostly due to placement issues as patient has [employer] and was difficult to place due to needs and patient preference.  Eventually decided to leave and stay at a hotel as she had recently inherited a large sum of money from her recently deceased daughter's life insurance policy.  She had plans to rent an apartment soon.  She refused placement at a [facility] or [facility] due to concerns that the facilities are substandard and she would not get treated well there.  She was seen by PT/OT and recommended a FWW which was ordered for her.  They also recommended [name] PT/OT which was ordered as well.  She was given written Rx for all her meds as she could only get them filled at a [facility].  Encouraged patient to get remaining meds and diabetes supplies prescribed from her PCP as unclear what would be in the [facility] formulary.    Code Status: Full      COMPLICATIONS DURING ADMISSION   Complication(s) During Admission: None           Vital Signs:  Current Vitals  Temp: 37 C (  98.6 F)  BP: 140/88  Pulse: 77  Resp: 18  SpO2: 100 %  Flow (L/min): 10   Weight: 100.6 kg (221 lb 12.5 oz)     Physical Exam:  General Appearance: alert, no distress  Eyes: PERRL, EOMI, sclerae normal  Mouth: normal  Neck:  supple, no adenopathy   Heart: normal rate and regular rhythm, no murmurs, clicks, or gallops  Lungs: clear to auscultation  Abdomen: BS normal, soft, NT/ND  Extremities: trace BLE edema and distal pulses normal  Neuro: CN II-XII intact, sensation and strength grossly normal  Skin: no rashes or lesions    Recommended Follow Up Appointments:  Myles Gip, MD  [address]  [place] [address]  [phone]    In 1 week      Scheduled Appointments:  No future appointments.     Pertinent Lab, Study, and Image Findings:   I personally reviewed the following:  BASIC METABOLIC PANEL Recent labs for the past 72 hours     11/18/15 0714 11/17/15 0741 11/16/15 0654    GLUCOSE 187* 202* 163*    UREA NITROGEN, BLOOD (BUN) 82* 75* 70*    CREATININE BLOOD 4.04* 4.22* 4.01*    SODIUM 137 135 136    POTASSIUM 4.6 4.7 4.9    CHLORIDE 107 104 107    CARBON DIOXIDE TOTAL 22* 21* 22*    CALCIUM 8.6 8.7 8.5*     CBC Recent labs for the past 72 hours     11/18/15 0714 11/17/15 0741 11/16/15 0654    WHITE BLOOD CELL COUNT 4.3* 4.2* 4.2*    HEMOGLOBIN 9.4* 9.8* 9.0*    HEMATOCRIT 30.0* 31.6* 29.3*    PLATELET COUNT 265 293 291     INR Recent labs for the past 72 hours     11/18/15 0714 11/17/15 0741 11/16/15 0654    INR 2.60* 2.56* 2.65*     Iron - 26  Transferrin - 117  TIBC - 163  Iron sat - 16  Ferritin - 844    A1c - 7.6    Left foot xray  Flattening and fragmentation of the navicular bone, compatible with  subacute fracture. Regions of sclerosis within the navicular bone likely  reflect osteonecrosis.    Old healed fracture of the proximal fifth metatarsal.    CT head  No acute intracranial abnormality    TTE  1. The LV systolic function is normal. The estimated LV ejection fraction is 65 %.  2. Trivial pericardial effusion.  3. Mild concentric left ventricular hypertrophy.  4. The left ventricular diastolic function is abnormal. Stage I: Impaired early left ventricular   relaxation (Abnormal relaxation pattern).  5. The  left atrium is normal in size and structure.  6. The right atrium is normal in size and structure.  7. Mildly elevated pulmonary artery systolic pressure.    Studies Pending at Time of Discharge:   None      Total time spent on discharge: 45 minutes.       Default CC to:    Myles Gip, MD     Additional CC to:  --   --     Report electronically signed by:  Sanjuana Kava, MD  Internal Medicine Attending  Pager: [phone]

## 2015-11-18 NOTE — Nurse Focus (Signed)
Pt asked this Clinical research associatewriter about going AMA. Pt stated she will talk with the doctor in the morning about leaving and setting up her medications if she leaves. Will continue to monitor pt closely.

## 2015-11-18 NOTE — Nurse Discharge Note (Addendum)
Pt discharged by [employer]. VSS. All lines removed, pt tolerated well. All belongings sent with pt, belongings release signed. Pt refused c-diff out. Paper rx given to pt. Discharge instructions given, understanding verbalized. Front wheel walker provided to pt at bedside, but pt refused. Pt also refused taking her L-foot brace/boot. MD notified.

## 2015-11-18 NOTE — Nurse Focus (Signed)
While taking pt vitals, pt started crying and stated "I have a lot on my mind and a lot of decisions to make". This Clinical research associatewriter spoke with pt for some time and provided emotional support. Pt calmer now and resting in bed with no current signs of distress. Will continue to monitor pt.

## 2015-11-18 NOTE — Discharge Instructions (Signed)
Take your medications as prescribed    Follow up with your primary care doctor, your nephrologist, your ophthalmologist, and the anticoagulation clinic for your warfarin    Have your primary doctor refer you to a orthopedic surgeon to follow up on your foot fracture and how long you need to wear your boot

## 2015-11-19 NOTE — Allied Health Consult (Signed)
PM&R -- ACUTE CARE SERVICE  PHYSICAL THERAPY DISCHARGE REPORT   This Discharge Summary Report based on physical therapy progress notes; no treatment or assessment rendered by this therapist on this date .    Name: Danisa Kopec  MRN: [id number]   Date: 11/19/2015    Initial Treatment Date:  10/23/2015  Onset Date of Illness or Injury:   10/20/2015    Primary Service:  (A) [place] and Significant Associated Diagnoses:  Left foot fracture    Functional Status:  Based on last seen PMR progress note  Key: Dep=Dependent     Max=Maximal     Mod=Moderate     Min=Minimal     CG=Contact Guard     SB=Stand-By     S=Supervised     I=Independent     NA=Not Applicable     NT=Not Tested     FWW=Front-Wheeled Walker     AC=Axillary Crutches     SPC=Single-Point Cane       Level of Assistance Dep Max Mod Min CG SB S I NA NT Comments:   Rolling                Scooting                Sidelying/ Supine to Sit       x       Transfer Sit to Stand      x     To FWW   Transfer Bed to Chair      x     Pivot transfer. Poor safety awareness   Gait       x      25 feet x2 FWW   Up/Down Step/Stairs          x    Other:  See last seen progress note for more details    Discharge Recommendations:        Continued Physical Therapy:  Home Health PT                            Level of Assistance or Supervision:  CGA/SBA for OOB mobility         Equipment: [employer] Exercise Program: N/A     Disposition:  Room and board    Patient / Caregiver Training Completed:  N/A    Comments:  Recommend continued skilled PT to progress transfer and gait safety/independence     Treatment Goals And Objectives:  Partially met goals    Interim Report and/ or Re-Evaluation Date(s):    11/06/2015 interim    Discharge Date:  11/28/2015    Reported by:  Bennie Hind, PT  PI# [id number]  Physical Therapist II  Physical Medicine and [place] [phone]

## 2015-11-27 ENCOUNTER — Telehealth: Payer: Self-pay | Admitting: Hospitalist

## 2015-11-27 NOTE — Telephone Encounter (Signed)
Post-discharge follow up Phone Call    Provider: Dr. Dutch GrayNajafi    Shannie Wylene MenLacey is a [age]yr old female.     AKI on CKD  Acute on chronic dCHF  Glaucoma  Vitreal hemorrhage with increased IOP s/p pars plana vitrectomy and endolaser OD  Diabetic retinopathy  Right eye blindness  Syncope  Asymptomatic bacteruria  HTN  Microcytic anemia  Rhinovirus infection  DM2 c/b neuropathy  PE (saddle embolus) s/p IVC filter  Left foot fracture    From the automated post-discharge phone call, patient indicated a call back needed for:   - call manually generated from unreached [employer] platform    Attempted to reach patient. Left message on unidentified VM to please call me back at [phone].     Jones Balesiana Leighann Amadon, RN, BSN  [facility] Post-discharge Phone Call Program  [phone]

## 2015-11-28 NOTE — Telephone Encounter (Signed)
Unable to reach patent.  Encounter closed.

## 2016-02-18 MED FILL — lidocaine 5 % topical patch: TOPICAL | Qty: 1 | Status: AC

## 2016-02-19 MED FILL — lidocaine 5 % topical patch: TOPICAL | Qty: 1 | Status: AC

## 2016-06-02 HISTORY — PX: AV FISTULA PLACEMENT: SHX1204

## 2017-10-18 ENCOUNTER — Encounter (HOSPITAL_COMMUNITY): Payer: Self-pay | Admitting: Emergency Medicine

## 2017-10-18 ENCOUNTER — Emergency Department (HOSPITAL_COMMUNITY): Payer: Self-pay

## 2017-10-18 ENCOUNTER — Inpatient Hospital Stay (HOSPITAL_COMMUNITY)
Admission: EM | Admit: 2017-10-18 | Discharge: 2017-10-21 | DRG: 189 | Disposition: A | Discharge status: Home / Self Care | Payer: Self-pay | Attending: Internal Medicine | Admitting: Internal Medicine

## 2017-10-18 DIAGNOSIS — J9600 Acute respiratory failure, unspecified whether with hypoxia or hypercapnia: Secondary | ICD-10-CM | POA: Diagnosis present

## 2017-10-18 DIAGNOSIS — J9601 Acute respiratory failure with hypoxia: Secondary | ICD-10-CM | POA: Diagnosis present

## 2017-10-18 DIAGNOSIS — R197 Diarrhea, unspecified: Secondary | ICD-10-CM | POA: Diagnosis present

## 2017-10-18 DIAGNOSIS — E1122 Type 2 diabetes mellitus with diabetic chronic kidney disease: Secondary | ICD-10-CM | POA: Diagnosis present

## 2017-10-18 DIAGNOSIS — X58XXXA Exposure to other specified factors, initial encounter: Secondary | ICD-10-CM

## 2017-10-18 DIAGNOSIS — N2581 Secondary hyperparathyroidism of renal origin: Secondary | ICD-10-CM | POA: Diagnosis present

## 2017-10-18 DIAGNOSIS — Z885 Allergy status to narcotic agent status: Secondary | ICD-10-CM

## 2017-10-18 DIAGNOSIS — I5033 Acute on chronic diastolic (congestive) heart failure: Secondary | ICD-10-CM | POA: Diagnosis present

## 2017-10-18 DIAGNOSIS — E1129 Type 2 diabetes mellitus with other diabetic kidney complication: Secondary | ICD-10-CM | POA: Diagnosis present

## 2017-10-18 DIAGNOSIS — Z6835 Body mass index (BMI) 35.0-35.9, adult: Secondary | ICD-10-CM

## 2017-10-18 DIAGNOSIS — Z888 Allergy status to other drugs, medicaments and biological substances status: Secondary | ICD-10-CM

## 2017-10-18 DIAGNOSIS — S82892A Other fracture of left lower leg, initial encounter for closed fracture: Secondary | ICD-10-CM | POA: Diagnosis present

## 2017-10-18 DIAGNOSIS — K529 Noninfective gastroenteritis and colitis, unspecified: Secondary | ICD-10-CM | POA: Diagnosis present

## 2017-10-18 DIAGNOSIS — R55 Syncope and collapse: Secondary | ICD-10-CM | POA: Diagnosis present

## 2017-10-18 DIAGNOSIS — I10 Essential (primary) hypertension: Secondary | ICD-10-CM | POA: Diagnosis present

## 2017-10-18 DIAGNOSIS — Z794 Long term (current) use of insulin: Secondary | ICD-10-CM

## 2017-10-18 DIAGNOSIS — E669 Obesity, unspecified: Secondary | ICD-10-CM | POA: Diagnosis present

## 2017-10-18 DIAGNOSIS — R112 Nausea with vomiting, unspecified: Secondary | ICD-10-CM | POA: Diagnosis present

## 2017-10-18 DIAGNOSIS — E875 Hyperkalemia: Secondary | ICD-10-CM | POA: Diagnosis present

## 2017-10-18 DIAGNOSIS — L8990 Pressure ulcer of unspecified site, unspecified stage: Secondary | ICD-10-CM

## 2017-10-18 DIAGNOSIS — J810 Acute pulmonary edema: Principal | ICD-10-CM | POA: Diagnosis present

## 2017-10-18 DIAGNOSIS — Z955 Presence of coronary angioplasty implant and graft: Secondary | ICD-10-CM

## 2017-10-18 DIAGNOSIS — L89620 Pressure ulcer of left heel, unstageable: Secondary | ICD-10-CM | POA: Diagnosis present

## 2017-10-18 DIAGNOSIS — Z7902 Long term (current) use of antithrombotics/antiplatelets: Secondary | ICD-10-CM

## 2017-10-18 DIAGNOSIS — F1123 Opioid dependence with withdrawal: Secondary | ICD-10-CM | POA: Diagnosis present

## 2017-10-18 DIAGNOSIS — D631 Anemia in chronic kidney disease: Secondary | ICD-10-CM | POA: Diagnosis present

## 2017-10-18 DIAGNOSIS — I2510 Atherosclerotic heart disease of native coronary artery without angina pectoris: Secondary | ICD-10-CM | POA: Diagnosis present

## 2017-10-18 DIAGNOSIS — Z7982 Long term (current) use of aspirin: Secondary | ICD-10-CM

## 2017-10-18 DIAGNOSIS — D649 Anemia, unspecified: Secondary | ICD-10-CM

## 2017-10-18 DIAGNOSIS — Z992 Dependence on renal dialysis: Secondary | ICD-10-CM

## 2017-10-18 DIAGNOSIS — E11610 Type 2 diabetes mellitus with diabetic neuropathic arthropathy: Secondary | ICD-10-CM | POA: Diagnosis present

## 2017-10-18 DIAGNOSIS — I132 Hypertensive heart and chronic kidney disease with heart failure and with stage 5 chronic kidney disease, or end stage renal disease: Secondary | ICD-10-CM | POA: Diagnosis present

## 2017-10-18 DIAGNOSIS — M898X9 Other specified disorders of bone, unspecified site: Secondary | ICD-10-CM | POA: Diagnosis present

## 2017-10-18 DIAGNOSIS — N186 End stage renal disease: Secondary | ICD-10-CM | POA: Diagnosis present

## 2017-10-18 DIAGNOSIS — Z9115 Patient's noncompliance with renal dialysis: Secondary | ICD-10-CM

## 2017-10-18 HISTORY — DX: Essential (primary) hypertension: I10

## 2017-10-18 HISTORY — DX: Unspecified asthma, uncomplicated: J45.909

## 2017-10-18 HISTORY — DX: Dependence on renal dialysis: Z99.2

## 2017-10-18 HISTORY — DX: Depression, unspecified: F32.A

## 2017-10-18 HISTORY — DX: Personal history of other medical treatment: Z92.89

## 2017-10-18 HISTORY — DX: Headache, unspecified: R51.9

## 2017-10-18 HISTORY — DX: Anemia, unspecified: D64.9

## 2017-10-18 HISTORY — DX: Anxiety disorder, unspecified: F41.9

## 2017-10-18 HISTORY — DX: End stage renal disease: N18.6

## 2017-10-18 HISTORY — DX: Major depressive disorder, single episode, unspecified: F32.9

## 2017-10-18 HISTORY — DX: Heart failure, unspecified: I50.9

## 2017-10-18 HISTORY — DX: Headache: R51

## 2017-10-18 HISTORY — DX: Type 1 diabetes mellitus without complications: E10.9

## 2017-10-18 HISTORY — DX: Other pulmonary embolism without acute cor pulmonale: I26.99

## 2017-10-18 HISTORY — DX: Pure hypercholesterolemia, unspecified: E78.00

## 2017-10-18 HISTORY — DX: Dependence on supplemental oxygen: Z99.81

## 2017-10-18 HISTORY — DX: Atherosclerotic heart disease of native coronary artery without angina pectoris: I25.10

## 2017-10-18 HISTORY — DX: Pneumonia, unspecified organism: J18.9

## 2017-10-18 LAB — COMPREHENSIVE METABOLIC PANEL
ALBUMIN: 3.5 g/dL (ref 3.5–5.0)
ALT: 45 U/L (ref 14–54)
ANION GAP: 12 (ref 5–15)
AST: 31 U/L (ref 15–41)
Alkaline Phosphatase: 131 U/L — ABNORMAL HIGH (ref 38–126)
BILIRUBIN TOTAL: 0.6 mg/dL (ref 0.3–1.2)
BUN: 89 mg/dL — ABNORMAL HIGH (ref 6–20)
CO2: 25 mmol/L (ref 22–32)
Calcium: 7.4 mg/dL — ABNORMAL LOW (ref 8.9–10.3)
Chloride: 104 mmol/L (ref 101–111)
Creatinine, Ser: 9.6 mg/dL — ABNORMAL HIGH (ref 0.44–1.00)
GFR calc Af Amer: 5 mL/min — ABNORMAL LOW (ref >60)
GFR calc non Af Amer: 4 mL/min — ABNORMAL LOW (ref >60)
GLUCOSE: 245 mg/dL — AB (ref 65–99)
POTASSIUM: 6.2 mmol/L — AB (ref 3.5–5.1)
SODIUM: 141 mmol/L (ref 135–145)
TOTAL PROTEIN: 6.7 g/dL (ref 6.5–8.1)

## 2017-10-18 LAB — CBC WITH DIFFERENTIAL/PLATELET
Abs Immature Granulocytes: 0 10*3/uL (ref 0.0–0.1)
Basophils Absolute: 0 10*3/uL (ref 0.0–0.1)
Basophils Relative: 0 %
Eosinophils Absolute: 0.1 10*3/uL (ref 0.0–0.7)
Eosinophils Relative: 3 %
HCT: 22.9 % — ABNORMAL LOW (ref 36.0–46.0)
Hemoglobin: 6.8 g/dL — CL (ref 12.0–15.0)
Immature Granulocytes: 0 %
Lymphocytes Relative: 20 %
Lymphs Abs: 1.1 10*3/uL (ref 0.7–4.0)
MCH: 23.1 pg — ABNORMAL LOW (ref 26.0–34.0)
MCHC: 29.7 g/dL — ABNORMAL LOW (ref 30.0–36.0)
MCV: 77.9 fL — ABNORMAL LOW (ref 78.0–100.0)
Monocytes Absolute: 0.3 10*3/uL (ref 0.1–1.0)
Monocytes Relative: 5 %
Neutro Abs: 4.1 10*3/uL (ref 1.7–7.7)
Neutrophils Relative %: 72 %
Platelets: 172 10*3/uL (ref 150–400)
RBC: 2.94 MIL/uL — ABNORMAL LOW (ref 3.87–5.11)
RDW: 18.3 % — ABNORMAL HIGH (ref 11.5–15.5)
WBC: 5.7 10*3/uL (ref 4.0–10.5)

## 2017-10-18 LAB — URINALYSIS, ROUTINE W REFLEX MICROSCOPIC
BILIRUBIN URINE: NEGATIVE
Bacteria, UA: NONE SEEN
Glucose, UA: >=500 mg/dL — AB
Ketones, ur: NEGATIVE mg/dL
Leukocytes, UA: NEGATIVE
NITRITE: NEGATIVE
PH: 6 (ref 5.0–8.0)
Protein, ur: >=300 mg/dL — AB
SPECIFIC GRAVITY, URINE: 1.014 (ref 1.005–1.030)

## 2017-10-18 LAB — RAPID URINE DRUG SCREEN, HOSP PERFORMED
Amphetamines: NOT DETECTED
Barbiturates: NOT DETECTED
Benzodiazepines: NOT DETECTED
Cocaine: NOT DETECTED
OPIATES: NOT DETECTED
TETRAHYDROCANNABINOL: NOT DETECTED

## 2017-10-18 LAB — CBG MONITORING, ED: Glucose-Capillary: 259 mg/dL — ABNORMAL HIGH (ref 65–99)

## 2017-10-18 LAB — I-STAT BETA HCG BLOOD, ED (MC, WL, AP ONLY): HCG, QUANTITATIVE: 6.4 m[IU]/mL — AB (ref <5)

## 2017-10-18 LAB — I-STAT TROPONIN, ED: Troponin i, poc: 0 ng/mL (ref 0.00–0.08)

## 2017-10-18 LAB — PREPARE RBC (CROSSMATCH)

## 2017-10-18 LAB — PREGNANCY, URINE: Preg Test, Ur: NEGATIVE

## 2017-10-18 LAB — POC OCCULT BLOOD, ED: FECAL OCCULT BLD: NEGATIVE

## 2017-10-18 LAB — LIPASE, BLOOD: LIPASE: 53 U/L — AB (ref 11–51)

## 2017-10-18 MED ORDER — SODIUM CHLORIDE 0.9 % IV SOLN
Freq: Once | INTRAVENOUS | Status: AC
  Administered 2017-10-18: via INTRAVENOUS

## 2017-10-18 MED ORDER — INSULIN ASPART 100 UNIT/ML ~~LOC~~ SOLN
2.0000 [IU] | Freq: Once | SUBCUTANEOUS | Status: AC
Start: 2017-10-18 — End: 2017-10-18
  Administered 2017-10-18: 2 [IU] via INTRAVENOUS
  Filled 2017-10-18: qty 1

## 2017-10-18 MED ORDER — SODIUM POLYSTYRENE SULFONATE 15 GM/60ML PO SUSP
15.0000 g | Freq: Once | ORAL | Status: AC
  Administered 2017-10-18: 15 g via ORAL
  Filled 2017-10-18: qty 60

## 2017-10-18 MED ORDER — FENTANYL CITRATE (PF) 100 MCG/2ML IJ SOLN
50.0000 ug | Freq: Once | INTRAMUSCULAR | Status: AC
  Administered 2017-10-18: 50 ug via INTRAVENOUS
  Filled 2017-10-18: qty 2

## 2017-10-18 NOTE — ED Triage Notes (Signed)
Pt arrives via EMS with reports of a syncopal episode while in a store. Pt reports diarrhea for 2 days. Denies pain. Reports missing her last 2 HD treatments and Left ankle swelling after husband stepped on it.
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
## 2017-10-18 NOTE — ED Notes (Signed)
Patient transported to CT 

## 2017-10-19 ENCOUNTER — Other Ambulatory Visit: Payer: Self-pay

## 2017-10-19 ENCOUNTER — Encounter (HOSPITAL_COMMUNITY): Payer: Self-pay | Admitting: Internal Medicine

## 2017-10-19 ENCOUNTER — Inpatient Hospital Stay (HOSPITAL_COMMUNITY): Payer: Self-pay

## 2017-10-19 DIAGNOSIS — D631 Anemia in chronic kidney disease: Secondary | ICD-10-CM

## 2017-10-19 DIAGNOSIS — R197 Diarrhea, unspecified: Secondary | ICD-10-CM | POA: Diagnosis present

## 2017-10-19 DIAGNOSIS — E1129 Type 2 diabetes mellitus with other diabetic kidney complication: Secondary | ICD-10-CM | POA: Diagnosis present

## 2017-10-19 DIAGNOSIS — R112 Nausea with vomiting, unspecified: Secondary | ICD-10-CM | POA: Diagnosis present

## 2017-10-19 DIAGNOSIS — J9601 Acute respiratory failure with hypoxia: Secondary | ICD-10-CM

## 2017-10-19 DIAGNOSIS — N186 End stage renal disease: Secondary | ICD-10-CM | POA: Diagnosis present

## 2017-10-19 DIAGNOSIS — S82892A Other fracture of left lower leg, initial encounter for closed fracture: Secondary | ICD-10-CM | POA: Diagnosis present

## 2017-10-19 DIAGNOSIS — I10 Essential (primary) hypertension: Secondary | ICD-10-CM | POA: Diagnosis present

## 2017-10-19 DIAGNOSIS — R55 Syncope and collapse: Secondary | ICD-10-CM | POA: Diagnosis present

## 2017-10-19 DIAGNOSIS — Z9289 Personal history of other medical treatment: Secondary | ICD-10-CM

## 2017-10-19 DIAGNOSIS — E1121 Type 2 diabetes mellitus with diabetic nephropathy: Secondary | ICD-10-CM

## 2017-10-19 DIAGNOSIS — I2510 Atherosclerotic heart disease of native coronary artery without angina pectoris: Secondary | ICD-10-CM | POA: Diagnosis present

## 2017-10-19 HISTORY — DX: Personal history of other medical treatment: Z92.89

## 2017-10-19 LAB — BASIC METABOLIC PANEL
ANION GAP: 16 — AB (ref 5–15)
BUN: 59 mg/dL — ABNORMAL HIGH (ref 6–20)
CO2: 24 mmol/L (ref 22–32)
Calcium: 8 mg/dL — ABNORMAL LOW (ref 8.9–10.3)
Chloride: 100 mmol/L — ABNORMAL LOW (ref 101–111)
Creatinine, Ser: 7.12 mg/dL — ABNORMAL HIGH (ref 0.44–1.00)
GFR, EST AFRICAN AMERICAN: 7 mL/min — AB (ref >60)
GFR, EST NON AFRICAN AMERICAN: 6 mL/min — AB (ref >60)
Glucose, Bld: 126 mg/dL — ABNORMAL HIGH (ref 65–99)
POTASSIUM: 4.4 mmol/L (ref 3.5–5.1)
Sodium: 140 mmol/L (ref 135–145)

## 2017-10-19 LAB — CBC
HCT: 29.2 % — ABNORMAL LOW (ref 36.0–46.0)
HEMOGLOBIN: 9.2 g/dL — AB (ref 12.0–15.0)
MCH: 24.7 pg — ABNORMAL LOW (ref 26.0–34.0)
MCHC: 31.5 g/dL (ref 30.0–36.0)
MCV: 78.3 fL (ref 78.0–100.0)
Platelets: 184 10*3/uL (ref 150–400)
RBC: 3.73 MIL/uL — AB (ref 3.87–5.11)
RDW: 17 % — ABNORMAL HIGH (ref 11.5–15.5)
WBC: 6.6 10*3/uL (ref 4.0–10.5)

## 2017-10-19 LAB — HIV ANTIBODY (ROUTINE TESTING W REFLEX): HIV SCREEN 4TH GENERATION: NONREACTIVE

## 2017-10-19 LAB — GLUCOSE, CAPILLARY
GLUCOSE-CAPILLARY: 117 mg/dL — AB (ref 65–99)
Glucose-Capillary: 140 mg/dL — ABNORMAL HIGH (ref 65–99)
Glucose-Capillary: 127 mg/dL — ABNORMAL HIGH (ref 65–99)

## 2017-10-19 LAB — TROPONIN I

## 2017-10-19 LAB — MAGNESIUM: Magnesium: 2.2 mg/dL (ref 1.7–2.4)

## 2017-10-19 LAB — ABO/RH: ABO/RH(D): A POS

## 2017-10-19 MED ORDER — FENTANYL CITRATE (PF) 100 MCG/2ML IJ SOLN
12.5000 ug | Freq: Once | INTRAMUSCULAR | Status: AC
  Administered 2017-10-19: 12.5 ug via INTRAVENOUS
  Filled 2017-10-19: qty 2

## 2017-10-19 MED ORDER — INSULIN ASPART 100 UNIT/ML ~~LOC~~ SOLN: 30.0000 [IU] | Freq: Every day | SUBCUTANEOUS | Status: DC

## 2017-10-19 MED ORDER — AMLODIPINE BESYLATE 10 MG PO TABS
10.0000 mg | ORAL_TABLET | Freq: Every day | ORAL | Status: DC
  Administered 2017-10-19 – 2017-10-21 (×3): 10 mg via ORAL
  Filled 2017-10-19: qty 1
  Filled 2017-10-19 (×2): qty 2
  Filled 2017-10-19: qty 1

## 2017-10-19 MED ORDER — RENA-VITE PO TABS
1.0000 | ORAL_TABLET | Freq: Every day | ORAL | Status: DC
  Administered 2017-10-19: 1 via ORAL
  Filled 2017-10-19 (×2): qty 1

## 2017-10-19 MED ORDER — INSULIN ASPART 100 UNIT/ML ~~LOC~~ SOLN
0.0000 [IU] | Freq: Three times a day (TID) | SUBCUTANEOUS | Status: DC
  Administered 2017-10-19 – 2017-10-21 (×2): 1 [IU] via SUBCUTANEOUS
  Administered 2017-10-21: 2 [IU] via SUBCUTANEOUS

## 2017-10-19 MED ORDER — DARBEPOETIN ALFA 100 MCG/0.5ML IJ SOSY
100.0000 ug | PREFILLED_SYRINGE | Freq: Once | INTRAMUSCULAR | Status: AC
  Administered 2017-10-19: 100 ug via INTRAVENOUS

## 2017-10-19 MED ORDER — CALCIUM ACETATE (PHOS BINDER) 667 MG PO CAPS
667.0000 mg | ORAL_CAPSULE | Freq: Three times a day (TID) | ORAL | Status: DC
  Administered 2017-10-19 – 2017-10-21 (×5): 667 mg via ORAL
  Filled 2017-10-19 (×10): qty 1

## 2017-10-19 MED ORDER — SODIUM CHLORIDE 0.9 % IV SOLN
62.5000 mg | INTRAVENOUS | Status: DC
  Administered 2017-10-20: 62.5 mg via INTRAVENOUS
  Filled 2017-10-19 (×2): qty 5

## 2017-10-19 MED ORDER — LIDOCAINE-PRILOCAINE 2.5-2.5 % EX CREA: 1.0000 "application " | TOPICAL_CREAM | CUTANEOUS | Status: DC | PRN

## 2017-10-19 MED ORDER — ESCITALOPRAM OXALATE 10 MG PO TABS
10.0000 mg | ORAL_TABLET | Freq: Every day | ORAL | Status: DC
  Administered 2017-10-19 – 2017-10-21 (×3): 10 mg via ORAL
  Filled 2017-10-19 (×3): qty 1

## 2017-10-19 MED ORDER — ZOLPIDEM TARTRATE 5 MG PO TABS
5.0000 mg | ORAL_TABLET | Freq: Once | ORAL | Status: AC
  Administered 2017-10-19: 5 mg via ORAL
  Filled 2017-10-19: qty 1

## 2017-10-19 MED ORDER — SODIUM CHLORIDE 0.9 % IV SOLN: 100.0000 mL | INTRAVENOUS | Status: DC | PRN

## 2017-10-19 MED ORDER — ACETAMINOPHEN 325 MG PO TABS
ORAL_TABLET | ORAL | Status: AC
  Administered 2017-10-19: 650 mg via ORAL
  Filled 2017-10-19: qty 2

## 2017-10-19 MED ORDER — HYDRALAZINE HCL 20 MG/ML IJ SOLN: 10.0000 mg | Freq: Four times a day (QID) | INTRAMUSCULAR | Status: DC | PRN

## 2017-10-19 MED ORDER — ONDANSETRON HCL 4 MG/2ML IJ SOLN: 4.0000 mg | Freq: Four times a day (QID) | INTRAMUSCULAR | Status: DC | PRN

## 2017-10-19 MED ORDER — GABAPENTIN 300 MG PO CAPS
300.0000 mg | ORAL_CAPSULE | Freq: Every day | ORAL | Status: DC
  Administered 2017-10-19 – 2017-10-20 (×2): 300 mg via ORAL
  Filled 2017-10-19 (×2): qty 1

## 2017-10-19 MED ORDER — ASPIRIN EC 81 MG PO TBEC
81.0000 mg | DELAYED_RELEASE_TABLET | Freq: Every day | ORAL | Status: DC
  Administered 2017-10-19 – 2017-10-21 (×3): 81 mg via ORAL
  Filled 2017-10-19 (×3): qty 1

## 2017-10-19 MED ORDER — INSULIN ASPART 100 UNIT/ML FLEXPEN: 30.0000 [IU] | PEN_INJECTOR | Freq: Every day | SUBCUTANEOUS | Status: DC

## 2017-10-19 MED ORDER — SEVELAMER CARBONATE 800 MG PO TABS
800.0000 mg | ORAL_TABLET | Freq: Three times a day (TID) | ORAL | Status: DC
  Administered 2017-10-19 – 2017-10-21 (×5): 800 mg via ORAL
  Filled 2017-10-19 (×7): qty 1

## 2017-10-19 MED ORDER — FENTANYL CITRATE (PF) 100 MCG/2ML IJ SOLN
12.5000 ug | Freq: Once | INTRAMUSCULAR | Status: DC
  Filled 2017-10-19: qty 2

## 2017-10-19 MED ORDER — DARBEPOETIN ALFA 100 MCG/0.5ML IJ SOSY
PREFILLED_SYRINGE | INTRAMUSCULAR | Status: AC
  Filled 2017-10-19: qty 0.5

## 2017-10-19 MED ORDER — HEPARIN SODIUM (PORCINE) 5000 UNIT/ML IJ SOLN
5000.0000 [IU] | Freq: Three times a day (TID) | INTRAMUSCULAR | Status: DC
  Administered 2017-10-19 – 2017-10-21 (×5): 5000 [IU] via SUBCUTANEOUS
  Filled 2017-10-19 (×5): qty 1

## 2017-10-19 MED ORDER — INSULIN GLARGINE 100 UNIT/ML ~~LOC~~ SOLN
5.0000 [IU] | Freq: Every day | SUBCUTANEOUS | Status: DC
  Administered 2017-10-19 – 2017-10-21 (×2): 5 [IU] via SUBCUTANEOUS
  Filled 2017-10-19 (×5): qty 0.05

## 2017-10-19 MED ORDER — PENTAFLUOROPROP-TETRAFLUOROETH EX AERO: 1.0000 "application " | INHALATION_SPRAY | CUTANEOUS | Status: DC | PRN

## 2017-10-19 MED ORDER — CLOPIDOGREL BISULFATE 75 MG PO TABS
75.0000 mg | ORAL_TABLET | Freq: Every day | ORAL | Status: DC
  Administered 2017-10-19 – 2017-10-21 (×3): 75 mg via ORAL
  Filled 2017-10-19 (×3): qty 1

## 2017-10-19 MED ORDER — DOXERCALCIFEROL 0.5 MCG PO CAPS
1.0000 ug | ORAL_CAPSULE | ORAL | Status: DC
  Filled 2017-10-19: qty 2

## 2017-10-19 MED ORDER — ONDANSETRON HCL 4 MG PO TABS: 4.0000 mg | ORAL_TABLET | Freq: Four times a day (QID) | ORAL | Status: DC | PRN

## 2017-10-19 MED ORDER — CARVEDILOL 12.5 MG PO TABS
12.5000 mg | ORAL_TABLET | Freq: Two times a day (BID) | ORAL | Status: DC
  Administered 2017-10-19 – 2017-10-21 (×5): 12.5 mg via ORAL
  Filled 2017-10-19 (×5): qty 1

## 2017-10-19 MED ORDER — ACETAMINOPHEN 325 MG PO TABS
650.0000 mg | ORAL_TABLET | Freq: Four times a day (QID) | ORAL | Status: DC | PRN
  Administered 2017-10-19 (×2): 650 mg via ORAL
  Filled 2017-10-19: qty 2

## 2017-10-19 NOTE — Progress Notes (Addendum)
Patient admitted after midnight, please see H&P.  Here after missing HD- got today and plan is to repeat in the AM.   ? Colitis on CT scan-- no WBC count or fever- hold on abx for now ? c diff-- recent abx  Lauren Bear DO
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
## 2017-10-19 NOTE — ED Notes (Signed)
Gave pt saltines and graham crackers and peanut butter, per Lovena Le - RN.
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
## 2017-10-20 ENCOUNTER — Other Ambulatory Visit (HOSPITAL_COMMUNITY): Payer: Self-pay

## 2017-10-20 DIAGNOSIS — L8990 Pressure ulcer of unspecified site, unspecified stage: Secondary | ICD-10-CM

## 2017-10-20 LAB — CBC WITH DIFFERENTIAL/PLATELET
ABS IMMATURE GRANULOCYTES: 0.1 10*3/uL (ref 0.0–0.1)
BASOS PCT: 0 %
Basophils Absolute: 0 10*3/uL (ref 0.0–0.1)
Eosinophils Absolute: 0.2 10*3/uL (ref 0.0–0.7)
Eosinophils Relative: 3 %
HCT: 32.1 % — ABNORMAL LOW (ref 36.0–46.0)
HEMOGLOBIN: 9.9 g/dL — AB (ref 12.0–15.0)
IMMATURE GRANULOCYTES: 1 %
LYMPHS ABS: 1 10*3/uL (ref 0.7–4.0)
LYMPHS PCT: 14 %
MCH: 24.5 pg — ABNORMAL LOW (ref 26.0–34.0)
MCHC: 30.8 g/dL (ref 30.0–36.0)
MCV: 79.5 fL (ref 78.0–100.0)
MONO ABS: 0.4 10*3/uL (ref 0.1–1.0)
MONOS PCT: 6 %
NEUTROS ABS: 5.4 10*3/uL (ref 1.7–7.7)
NEUTROS PCT: 76 %
Platelets: 209 10*3/uL (ref 150–400)
RBC: 4.04 MIL/uL (ref 3.87–5.11)
RDW: 16.9 % — AB (ref 11.5–15.5)
WBC: 7.1 10*3/uL (ref 4.0–10.5)

## 2017-10-20 LAB — TYPE AND SCREEN
ABO/RH(D): A POS
Antibody Screen: NEGATIVE
UNIT DIVISION: 0
UNIT DIVISION: 0

## 2017-10-20 LAB — RENAL FUNCTION PANEL
Albumin: 3.5 g/dL (ref 3.5–5.0)
Anion gap: 12 (ref 5–15)
BUN: 34 mg/dL — AB (ref 6–20)
CHLORIDE: 101 mmol/L (ref 101–111)
CO2: 26 mmol/L (ref 22–32)
CREATININE: 5.69 mg/dL — AB (ref 0.44–1.00)
Calcium: 8.7 mg/dL — ABNORMAL LOW (ref 8.9–10.3)
GFR calc Af Amer: 9 mL/min — ABNORMAL LOW (ref >60)
GFR calc non Af Amer: 8 mL/min — ABNORMAL LOW (ref >60)
GLUCOSE: 97 mg/dL (ref 65–99)
POTASSIUM: 4.8 mmol/L (ref 3.5–5.1)
Phosphorus: 5.3 mg/dL — ABNORMAL HIGH (ref 2.5–4.6)
Sodium: 139 mmol/L (ref 135–145)

## 2017-10-20 LAB — BPAM RBC
BLOOD PRODUCT EXPIRATION DATE: 201906052359
BLOOD PRODUCT EXPIRATION DATE: 201906052359
ISSUE DATE / TIME: 201905200221
ISSUE DATE / TIME: 201905192309
UNIT TYPE AND RH: 6200
Unit Type and Rh: 6200

## 2017-10-20 LAB — GLUCOSE, CAPILLARY
GLUCOSE-CAPILLARY: 195 mg/dL — AB (ref 65–99)
Glucose-Capillary: 96 mg/dL (ref 65–99)
Glucose-Capillary: 121 mg/dL — ABNORMAL HIGH (ref 65–99)
Glucose-Capillary: 76 mg/dL (ref 65–99)

## 2017-10-20 LAB — MRSA PCR SCREENING: MRSA by PCR: NEGATIVE

## 2017-10-20 MED ORDER — AMOXICILLIN-POT CLAVULANATE 500-125 MG PO TABS
1.0000 | ORAL_TABLET | Freq: Once | ORAL | Status: AC
  Administered 2017-10-20: 500 mg via ORAL
  Filled 2017-10-20: qty 1

## 2017-10-20 MED ORDER — TRAMADOL HCL 50 MG PO TABS
50.0000 mg | ORAL_TABLET | Freq: Two times a day (BID) | ORAL | Status: DC | PRN
Start: 2017-10-20 — End: 2017-10-21
  Administered 2017-10-20 – 2017-10-21 (×3): 50 mg via ORAL
  Filled 2017-10-20 (×3): qty 1

## 2017-10-20 MED ORDER — AMOXICILLIN-POT CLAVULANATE 500-125 MG PO TABS
1.0000 | ORAL_TABLET | Freq: Every evening | ORAL | Status: DC
  Administered 2017-10-21: 500 mg via ORAL
  Filled 2017-10-20: qty 1

## 2017-10-20 MED ORDER — FENTANYL CITRATE (PF) 100 MCG/2ML IJ SOLN
12.5000 ug | Freq: Once | INTRAMUSCULAR | Status: AC
  Administered 2017-10-20: 12.5 ug via INTRAVENOUS
  Filled 2017-10-20: qty 2

## 2017-10-20 MED ORDER — DOXERCALCIFEROL 0.5 MCG PO CAPS
ORAL_CAPSULE | ORAL | Status: AC
  Administered 2017-10-20: 1 ug
  Filled 2017-10-20: qty 2

## 2017-10-20 NOTE — Progress Notes (Signed)
Pt gone to dialysis via bed.   
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
## 2017-10-20 NOTE — Progress Notes (Signed)
Patient complaining of abdominal pain 9/10 unrelieved by tylenol requesting fentanyl. Text paged Chaney Malling NP.New orders received .

## 2017-10-20 NOTE — Progress Notes (Signed)
PT Cancellation Note  Patient Details Name: Haeleigh Streiff MRN: [id number] DOB: 11-27-1965   Cancelled Treatment:    Reason Eval/Treat Not Completed: Patient at procedure or test/unavailable   Currently in HD;  Will follow up later today as time allows;  Otherwise, will follow up for PT at a later date;   Thank you,  Roney Marion, PT  Acute Rehabilitation Services Pager [phone] Office [place] 10/20/2017, 1:55 PM

## 2017-10-21 ENCOUNTER — Inpatient Hospital Stay (HOSPITAL_COMMUNITY): Payer: Self-pay

## 2017-10-21 DIAGNOSIS — I340 Nonrheumatic mitral (valve) insufficiency: Secondary | ICD-10-CM

## 2017-10-21 DIAGNOSIS — I361 Nonrheumatic tricuspid (valve) insufficiency: Secondary | ICD-10-CM

## 2017-10-21 DIAGNOSIS — D649 Anemia, unspecified: Secondary | ICD-10-CM

## 2017-10-21 LAB — GLUCOSE, CAPILLARY
GLUCOSE-CAPILLARY: 150 mg/dL — AB (ref 65–99)
GLUCOSE-CAPILLARY: 166 mg/dL — AB (ref 65–99)
Glucose-Capillary: 120 mg/dL — ABNORMAL HIGH (ref 65–99)

## 2017-10-21 LAB — ECHOCARDIOGRAM COMPLETE
HEIGHTINCHES: 67 in
Weight: 3460.8 oz

## 2017-10-21 MED ORDER — AMOXICILLIN-POT CLAVULANATE 500-125 MG PO TABS: 1.0000 | ORAL_TABLET | Freq: Every evening | ORAL | 0 refills | Status: DC

## 2017-10-21 MED ORDER — TRAMADOL HCL 50 MG PO TABS: 25.0000 mg | ORAL_TABLET | Freq: Two times a day (BID) | ORAL | Status: DC | PRN

## 2017-10-21 MED ORDER — RENA-VITE PO TABS: 1.0000 | ORAL_TABLET | Freq: Every day | ORAL | 0 refills | Status: DC

## 2017-10-21 MED ORDER — PERFLUTREN LIPID MICROSPHERE
1.0000 mL | INTRAVENOUS | Status: AC | PRN
  Administered 2017-10-21: 2 mL via INTRAVENOUS
  Filled 2017-10-21: qty 10

## 2017-10-21 MED ORDER — TRAMADOL HCL 50 MG PO TABS: 25.0000 mg | ORAL_TABLET | Freq: Two times a day (BID) | ORAL | 0 refills | Status: DC | PRN

## 2017-10-21 MED ORDER — UNABLE TO FIND: 0 refills | Status: DC

## 2017-10-21 MED ORDER — ACETAMINOPHEN 325 MG PO TABS: 650.0000 mg | ORAL_TABLET | ORAL | Status: DC | PRN

## 2017-10-21 NOTE — Progress Notes (Signed)
  Echocardiogram 2D Echocardiogram has been performed.  Jennette Dubin 10/21/2017, 11:25 AM

## 2017-10-21 NOTE — Progress Notes (Signed)
Patient discharged to home, AVS reviewed, IV removed, telebox returned, cab voucher provided, patient left floor via wheelchair with staffmember
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
## 2017-10-21 NOTE — Progress Notes (Signed)
Pt. assisted to [facility]. (2) small, football shaped, white pills noted in bed, underneath her buttocks. Pt stated she had no idea what they were. Pills placed in sharps container.

## 2017-10-21 NOTE — Progress Notes (Signed)
Orthopedic Tech Progress Note Patient Details:  Lauren Warner Aug 08, 1965 [id number]  Ortho Devices Type of Ortho Device: CAM walker Ortho Device/Splint Location: lle Ortho Device/Splint Interventions: Application   Post Interventions Patient Tolerated: Well Instructions Provided: Care of device Post op boot to be cancelled; RN notified  Hildred Priest 10/21/2017, 2:01 PM
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
## 2017-10-21 NOTE — Clinical Social Work Note (Signed)
Patient's nurse provided with cab voucher for Ms. Lauren Warner for transport home. CSW signing off as patient will be discharged this afternoon back to her hotel.  Arye Weyenberg Givens, MSW, LCSW Licensed Clinical Social Worker [place] [phone]
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
## 2017-10-21 NOTE — Care Management Note (Addendum)
Case Management Note  Patient Details  Name: Lauren Warner MRN: [id number] Date of Birth: 29-Nov-1965  Subjective/Objective:                  Admitted for Acute respiratory failure with hypoxia; Prior to admission had sustained a fracture of the left ankle for which she has outpatient follow-up with podiatry.   Action/Plan: Recently moved to [place] 2 weeks ago and has been getting dialysis at [facility].  In to speak with patient, NCM obtained Authorization for medical records from PCP Campbell Stall, [place] and faxed to [facility]. Also discussed recommendation for Outpatient physical therapy, internal referral sent to [facility], and patient phone number provided, explained to patient rehab center will call her to set up appointment.  Patient Phone number [phone]).  Attending requesting Knee Scooter for Patient, NCM discussed DME Knee walker need with [employer] representative with [facility].  Per [employer] they do not provide DME: knee scooters; patient requires script and must go get it at [facility].  NCM called [facility] at [phone], ext [phone] left message requesting return call as "Urgent" to get price quote, see if in stock. NCM outreached [facility] representative Dan to see if he can find out if the DME scooter is in stock at [facility] location and price.  Per [facility] does have Knee scooter in stock $199 plus tax.   NCM had unsuccessful call attempt to [facility] in [place], phone rang greater than 8 times, no answer/no voicemail option.  NCM advised patient unfortunately hospital is unable to supply knee scooter, she will need to take md order to medical supply store and purchase.  Patient verbalized understanding.   Expected Discharge Date:    10/21/1017              Expected Discharge Plan:  OP Rehab  In-House Referral:  Clinical Social Work  Discharge  planning Services  CM Consult  Post Acute Care Choice:  (Outpatient Rehab) Choice offered to:  Patient  Status of Service:  Completed, signed off  Kristen Cardinal, RN 10/21/2017, 12:41 PM

## 2017-10-31 DIAGNOSIS — E875 Hyperkalemia: Secondary | ICD-10-CM

## 2017-10-31 HISTORY — DX: Hyperkalemia: E87.5

## 2017-11-10 ENCOUNTER — Encounter (HOSPITAL_COMMUNITY): Payer: Self-pay | Admitting: Pharmacy Technician

## 2017-11-10 ENCOUNTER — Inpatient Hospital Stay (HOSPITAL_COMMUNITY)
Admission: EM | Admit: 2017-11-10 | Discharge: 2017-11-12 | DRG: 640 | Disposition: A | Discharge status: Home / Self Care | Payer: Self-pay | Attending: Internal Medicine | Admitting: Internal Medicine

## 2017-11-10 ENCOUNTER — Emergency Department (HOSPITAL_COMMUNITY): Payer: Self-pay

## 2017-11-10 DIAGNOSIS — I2510 Atherosclerotic heart disease of native coronary artery without angina pectoris: Secondary | ICD-10-CM | POA: Diagnosis present

## 2017-11-10 DIAGNOSIS — Z9115 Patient's noncompliance with renal dialysis: Secondary | ICD-10-CM

## 2017-11-10 DIAGNOSIS — Z885 Allergy status to narcotic agent status: Secondary | ICD-10-CM

## 2017-11-10 DIAGNOSIS — Z992 Dependence on renal dialysis: Secondary | ICD-10-CM

## 2017-11-10 DIAGNOSIS — D631 Anemia in chronic kidney disease: Secondary | ICD-10-CM | POA: Diagnosis present

## 2017-11-10 DIAGNOSIS — R112 Nausea with vomiting, unspecified: Secondary | ICD-10-CM | POA: Diagnosis present

## 2017-11-10 DIAGNOSIS — J9621 Acute and chronic respiratory failure with hypoxia: Secondary | ICD-10-CM | POA: Diagnosis present

## 2017-11-10 DIAGNOSIS — I509 Heart failure, unspecified: Secondary | ICD-10-CM | POA: Diagnosis present

## 2017-11-10 DIAGNOSIS — Z888 Allergy status to other drugs, medicaments and biological substances status: Secondary | ICD-10-CM

## 2017-11-10 DIAGNOSIS — N186 End stage renal disease: Secondary | ICD-10-CM | POA: Diagnosis present

## 2017-11-10 DIAGNOSIS — Z9119 Patient's noncompliance with other medical treatment and regimen: Secondary | ICD-10-CM

## 2017-11-10 DIAGNOSIS — E1022 Type 1 diabetes mellitus with diabetic chronic kidney disease: Secondary | ICD-10-CM | POA: Diagnosis present

## 2017-11-10 DIAGNOSIS — Z7982 Long term (current) use of aspirin: Secondary | ICD-10-CM

## 2017-11-10 DIAGNOSIS — Z955 Presence of coronary angioplasty implant and graft: Secondary | ICD-10-CM

## 2017-11-10 DIAGNOSIS — N2581 Secondary hyperparathyroidism of renal origin: Secondary | ICD-10-CM | POA: Diagnosis present

## 2017-11-10 DIAGNOSIS — Z9981 Dependence on supplemental oxygen: Secondary | ICD-10-CM

## 2017-11-10 DIAGNOSIS — J9600 Acute respiratory failure, unspecified whether with hypoxia or hypercapnia: Secondary | ICD-10-CM | POA: Diagnosis present

## 2017-11-10 DIAGNOSIS — R197 Diarrhea, unspecified: Secondary | ICD-10-CM | POA: Diagnosis present

## 2017-11-10 DIAGNOSIS — J9601 Acute respiratory failure with hypoxia: Secondary | ICD-10-CM

## 2017-11-10 DIAGNOSIS — E1129 Type 2 diabetes mellitus with other diabetic kidney complication: Secondary | ICD-10-CM | POA: Diagnosis present

## 2017-11-10 DIAGNOSIS — Z79899 Other long term (current) drug therapy: Secondary | ICD-10-CM

## 2017-11-10 DIAGNOSIS — E875 Hyperkalemia: Principal | ICD-10-CM | POA: Diagnosis present

## 2017-11-10 DIAGNOSIS — Z7902 Long term (current) use of antithrombotics/antiplatelets: Secondary | ICD-10-CM

## 2017-11-10 DIAGNOSIS — I10 Essential (primary) hypertension: Secondary | ICD-10-CM | POA: Diagnosis present

## 2017-11-10 DIAGNOSIS — J810 Acute pulmonary edema: Secondary | ICD-10-CM

## 2017-11-10 DIAGNOSIS — E872 Acidosis: Secondary | ICD-10-CM | POA: Diagnosis present

## 2017-11-10 DIAGNOSIS — I132 Hypertensive heart and chronic kidney disease with heart failure and with stage 5 chronic kidney disease, or end stage renal disease: Secondary | ICD-10-CM | POA: Diagnosis present

## 2017-11-10 HISTORY — DX: Hyperkalemia: E87.5

## 2017-11-10 LAB — BASIC METABOLIC PANEL
ANION GAP: 16 — AB (ref 5–15)
BUN: 139 mg/dL — AB (ref 6–20)
CO2: 17 mmol/L — AB (ref 22–32)
Calcium: 7.6 mg/dL — ABNORMAL LOW (ref 8.9–10.3)
Chloride: 107 mmol/L (ref 101–111)
Creatinine, Ser: 13.53 mg/dL — ABNORMAL HIGH (ref 0.44–1.00)
GFR calc Af Amer: 3 mL/min — ABNORMAL LOW (ref >60)
GFR calc non Af Amer: 3 mL/min — ABNORMAL LOW (ref >60)
GLUCOSE: 178 mg/dL — AB (ref 65–99)
POTASSIUM: 6.6 mmol/L — AB (ref 3.5–5.1)
Sodium: 140 mmol/L (ref 135–145)

## 2017-11-10 LAB — GLUCOSE, CAPILLARY: Glucose-Capillary: 90 mg/dL (ref 65–99)

## 2017-11-10 LAB — CBC
HEMATOCRIT: 27.9 % — AB (ref 36.0–46.0)
HEMOGLOBIN: 8.4 g/dL — AB (ref 12.0–15.0)
MCH: 24.3 pg — AB (ref 26.0–34.0)
MCHC: 30.1 g/dL (ref 30.0–36.0)
MCV: 80.6 fL (ref 78.0–100.0)
Platelets: 157 10*3/uL (ref 150–400)
RBC: 3.46 MIL/uL — ABNORMAL LOW (ref 3.87–5.11)
RDW: 19.4 % — AB (ref 11.5–15.5)
WBC: 3.4 10*3/uL — ABNORMAL LOW (ref 4.0–10.5)

## 2017-11-10 LAB — I-STAT CHEM 8, ED
BUN: 125 mg/dL — AB (ref 6–20)
CALCIUM ION: 0.9 mmol/L — AB (ref 1.15–1.40)
CHLORIDE: 109 mmol/L (ref 101–111)
CREATININE: 13.9 mg/dL — AB (ref 0.44–1.00)
GLUCOSE: 181 mg/dL — AB (ref 65–99)
HCT: 27 % — ABNORMAL LOW (ref 36.0–46.0)
Hemoglobin: 9.2 g/dL — ABNORMAL LOW (ref 12.0–15.0)
Potassium: 6.5 mmol/L (ref 3.5–5.1)
Sodium: 138 mmol/L (ref 135–145)
TCO2: 18 mmol/L — AB (ref 22–32)

## 2017-11-10 LAB — I-STAT TROPONIN, ED: Troponin i, poc: 0.01 ng/mL (ref 0.00–0.08)

## 2017-11-10 LAB — HEMOGLOBIN A1C
Hgb A1c MFr Bld: 7.5 % — ABNORMAL HIGH (ref 4.8–5.6)
MEAN PLASMA GLUCOSE: 168.55 mg/dL

## 2017-11-10 MED ORDER — SODIUM CHLORIDE 0.9% FLUSH
3.0000 mL | Freq: Two times a day (BID) | INTRAVENOUS | Status: DC
  Administered 2017-11-11 – 2017-11-12 (×2): 3 mL via INTRAVENOUS

## 2017-11-10 MED ORDER — SODIUM CHLORIDE 0.9 % IV SOLN
1.0000 g | Freq: Once | INTRAVENOUS | Status: AC
  Administered 2017-11-10: 1 g via INTRAVENOUS
  Filled 2017-11-10: qty 10

## 2017-11-10 MED ORDER — CHLORHEXIDINE GLUCONATE CLOTH 2 % EX PADS
6.0000 | MEDICATED_PAD | Freq: Every day | CUTANEOUS | Status: DC
  Administered 2017-11-11 – 2017-11-12 (×2): 6 via TOPICAL

## 2017-11-10 MED ORDER — CLOPIDOGREL BISULFATE 75 MG PO TABS
75.0000 mg | ORAL_TABLET | Freq: Every day | ORAL | Status: DC
  Administered 2017-11-11 – 2017-11-12 (×2): 75 mg via ORAL
  Filled 2017-11-10 (×2): qty 1

## 2017-11-10 MED ORDER — CALCIUM ACETATE (PHOS BINDER) 667 MG PO CAPS
667.0000 mg | ORAL_CAPSULE | Freq: Three times a day (TID) | ORAL | Status: DC
  Administered 2017-11-11 – 2017-11-12 (×4): 667 mg via ORAL
  Filled 2017-11-10 (×5): qty 1

## 2017-11-10 MED ORDER — ONDANSETRON HCL 4 MG PO TABS: 4.0000 mg | ORAL_TABLET | Freq: Four times a day (QID) | ORAL | Status: DC | PRN

## 2017-11-10 MED ORDER — HYDRALAZINE HCL 20 MG/ML IJ SOLN
10.0000 mg | Freq: Once | INTRAMUSCULAR | Status: AC
  Administered 2017-11-10: 10 mg via INTRAVENOUS
  Filled 2017-11-10: qty 1

## 2017-11-10 MED ORDER — ACETAMINOPHEN 650 MG RE SUPP: 650.0000 mg | Freq: Four times a day (QID) | RECTAL | Status: DC | PRN

## 2017-11-10 MED ORDER — LIDOCAINE-PRILOCAINE 2.5-2.5 % EX CREA
1.0000 "application " | TOPICAL_CREAM | CUTANEOUS | Status: DC | PRN
  Filled 2017-11-10: qty 5

## 2017-11-10 MED ORDER — CHLORHEXIDINE GLUCONATE CLOTH 2 % EX PADS
6.0000 | MEDICATED_PAD | Freq: Every day | CUTANEOUS | Status: DC
  Administered 2017-11-11: 6 via TOPICAL

## 2017-11-10 MED ORDER — SODIUM CHLORIDE 0.9 % IV SOLN: 100.0000 mL | INTRAVENOUS | Status: DC | PRN

## 2017-11-10 MED ORDER — ALTEPLASE 2 MG IJ SOLR: 2.0000 mg | Freq: Once | INTRAMUSCULAR | Status: DC | PRN

## 2017-11-10 MED ORDER — ASPIRIN EC 81 MG PO TBEC
81.0000 mg | DELAYED_RELEASE_TABLET | Freq: Every day | ORAL | Status: DC
  Administered 2017-11-11 – 2017-11-12 (×2): 81 mg via ORAL
  Filled 2017-11-10 (×2): qty 1

## 2017-11-10 MED ORDER — DOXERCALCIFEROL 4 MCG/2ML IV SOLN
1.0000 ug | INTRAVENOUS | Status: DC
  Administered 2017-11-12: 1 ug via INTRAVENOUS
  Filled 2017-11-10: qty 2

## 2017-11-10 MED ORDER — CARVEDILOL 12.5 MG PO TABS
12.5000 mg | ORAL_TABLET | Freq: Two times a day (BID) | ORAL | Status: DC
  Administered 2017-11-11: 12.5 mg via ORAL
  Filled 2017-11-10: qty 1

## 2017-11-10 MED ORDER — SODIUM CHLORIDE 0.9% FLUSH: 3.0000 mL | INTRAVENOUS | Status: DC | PRN

## 2017-11-10 MED ORDER — GABAPENTIN 300 MG PO CAPS
300.0000 mg | ORAL_CAPSULE | Freq: Every day | ORAL | Status: DC
  Administered 2017-11-10: 300 mg via ORAL
  Filled 2017-11-10: qty 1

## 2017-11-10 MED ORDER — PENTAFLUOROPROP-TETRAFLUOROETH EX AERO
1.0000 | INHALATION_SPRAY | CUTANEOUS | Status: DC | PRN
Start: 2017-11-10 — End: 2017-11-12
  Filled 2017-11-10: qty 103.5

## 2017-11-10 MED ORDER — HEPARIN SODIUM (PORCINE) 5000 UNIT/ML IJ SOLN
5000.0000 [IU] | Freq: Three times a day (TID) | INTRAMUSCULAR | Status: DC
  Administered 2017-11-10 – 2017-11-12 (×4): 5000 [IU] via SUBCUTANEOUS
  Filled 2017-11-10 (×4): qty 1

## 2017-11-10 MED ORDER — SEVELAMER CARBONATE 800 MG PO TABS
800.0000 mg | ORAL_TABLET | Freq: Three times a day (TID) | ORAL | Status: DC
  Administered 2017-11-11 – 2017-11-12 (×4): 800 mg via ORAL
  Filled 2017-11-10 (×4): qty 1

## 2017-11-10 MED ORDER — LIDOCAINE HCL (PF) 1 % IJ SOLN: 5.0000 mL | INTRAMUSCULAR | Status: DC | PRN

## 2017-11-10 MED ORDER — AMLODIPINE BESYLATE 10 MG PO TABS
10.0000 mg | ORAL_TABLET | Freq: Every day | ORAL | Status: DC
  Administered 2017-11-12: 10 mg via ORAL
  Filled 2017-11-10: qty 1

## 2017-11-10 MED ORDER — ONDANSETRON HCL 4 MG/2ML IJ SOLN
4.0000 mg | Freq: Four times a day (QID) | INTRAMUSCULAR | Status: DC | PRN
  Administered 2017-11-11 – 2017-11-12 (×2): 4 mg via INTRAVENOUS
  Filled 2017-11-10 (×2): qty 2

## 2017-11-10 MED ORDER — ACETAMINOPHEN 325 MG PO TABS
650.0000 mg | ORAL_TABLET | Freq: Four times a day (QID) | ORAL | Status: DC | PRN
  Administered 2017-11-11: 650 mg via ORAL

## 2017-11-10 MED ORDER — TRAMADOL HCL 50 MG PO TABS
25.0000 mg | ORAL_TABLET | Freq: Two times a day (BID) | ORAL | Status: DC | PRN
  Administered 2017-11-11 – 2017-11-12 (×3): 25 mg via ORAL
  Filled 2017-11-10 (×3): qty 1

## 2017-11-10 MED ORDER — LATANOPROST 0.005 % OP SOLN
1.0000 [drp] | Freq: Every day | OPHTHALMIC | Status: DC
  Administered 2017-11-10 – 2017-11-11 (×2): 1 [drp] via OPHTHALMIC
  Filled 2017-11-10: qty 2.5

## 2017-11-10 MED ORDER — HEPARIN SODIUM (PORCINE) 1000 UNIT/ML DIALYSIS
1000.0000 [IU] | INTRAMUSCULAR | Status: DC | PRN
  Filled 2017-11-10: qty 1

## 2017-11-10 MED ORDER — RENA-VITE PO TABS
1.0000 | ORAL_TABLET | Freq: Every day | ORAL | Status: DC
  Administered 2017-11-10 – 2017-11-11 (×2): 1 via ORAL
  Filled 2017-11-10 (×2): qty 1

## 2017-11-10 MED ORDER — ESCITALOPRAM OXALATE 10 MG PO TABS
10.0000 mg | ORAL_TABLET | Freq: Every day | ORAL | Status: DC
  Administered 2017-11-11 – 2017-11-12 (×2): 10 mg via ORAL
  Filled 2017-11-10 (×2): qty 1

## 2017-11-10 MED ORDER — SODIUM CHLORIDE 0.9 % IV SOLN: 250.0000 mL | INTRAVENOUS | Status: DC | PRN

## 2017-11-10 MED ORDER — SODIUM CHLORIDE 0.9 % IV SOLN
250.0000 mg | INTRAVENOUS | Status: DC
  Administered 2017-11-12: 250 mg via INTRAVENOUS
  Filled 2017-11-10 (×3): qty 20

## 2017-11-10 MED ORDER — INSULIN ASPART 100 UNIT/ML ~~LOC~~ SOLN
0.0000 [IU] | SUBCUTANEOUS | Status: DC
  Administered 2017-11-11 (×2): 3 [IU] via SUBCUTANEOUS
  Administered 2017-11-12: 2 [IU] via SUBCUTANEOUS
  Administered 2017-11-12: 3 [IU] via SUBCUTANEOUS

## 2017-11-10 NOTE — ED Triage Notes (Signed)
Pt arrives via EMS from home with reports of missing dialysis. States last tx was 2 saturdays ago. Pt states she has missed dialysis because she cannot afford transport. Pt with SOB, peripheral edema to lower extremities, ascites, weakness. Pt with hx CHF, cardiac stent and renal failure. A&OX4. CBG 217, BP 130/72, HR 70, RR 18, 100% RA. EKG unremarkable.

## 2017-11-10 NOTE — Consult Note (Addendum)
[facility] Renal Consultation Note    Indication for Consultation:  Management of ESRD/hemodialysis; anemia, hypertension/volume and secondary hyperparathyroidism PCP: Campbell Stall, [name]  HPI: Lauren Warner is a 52 y.o. female with ESRD on HD for about a year who moved to [facility] from [place] without a HD unit.  She came here because she visited and stand with her grandmother and liked it here.  She reports she has been able to secure an apartment but did not have the cash for a taxi to drive to dialysis.  She has been only been to 3 dialysis treatments since her May discharge from [facility]. Prior records indicate a history of noncompliance. She is [facility] the [facility] HD unit.   When transportation came today to take her to dialysis, she felt very bad and the bad and decided to come to the hospital.  She is SOB, weak, tired, sleepy.  She has had some N, V, D and makes a little urine.  Missing dialysis has made it difficult for the SW to help her with the necessary arrangements for Medicaid and transportation.   Labs pre HD K 6.6 CO2 17 BUN 139 Cr 13.53 hgb 8.4 CXR interstitial pulmonary edema.  Past Medical History:  Diagnosis Date  . Anemia   . Anxiety   . Asthma   . CHF (congestive heart failure) ([place])   . Coronary artery disease   . Daily headache   . Depression   . ESRD (end stage renal disease) on dialysis ([place])    "[facility]; TTS" (10/19/2017)  . High cholesterol   . History of blood transfusion 10/19/2017   "anemia"  . Hypertension   . On home oxygen therapy    "2L; daily" (10/19/2017)  . Pneumonia    "several times" (10/19/2017)  . Pulmonary embolism ([place]) 2012  . Type 1 diabetes mellitus ([place])    Past Surgical History:  Procedure Laterality Date  . AV FISTULA PLACEMENT Left 2018  . CESAREAN SECTION  1989  . CORONARY ANGIOPLASTY WITH STENT PLACEMENT  ~ 08/2017   "3 stents" (10/19/2017)  . ENDOMETRIAL ABLATION  ~ 2011    Family History  Problem Relation Age of Onset  . Stroke Sister    Social History:  reports that she has never smoked. She has never used smokeless tobacco. She reports that she does not drink alcohol or use drugs. Allergies  Allergen Reactions  . Hydrocodone-Acetaminophen Other (See Comments)    "makes me fell crazy"  . Metformin And Related Other (See Comments)    "Makes me feel crazy"   Prior to Admission medications   Medication Sig Start Date End Date Taking? Authorizing Provider  amLODipine (NORVASC) 10 MG tablet Take 10 mg by mouth daily.   Yes [provider]  aspirin EC 81 MG tablet Take 81 mg by mouth daily.   Yes [provider]  calcium acetate (PHOSLO) 667 MG capsule Take 667 mg by mouth 3 (three) times daily with meals.   Yes [provider]  carvedilol (COREG) 12.5 MG tablet Take 12.5 mg by mouth 2 (two) times daily with a meal.   Yes [provider]  clopidogrel (PLAVIX) 75 MG tablet Take 75 mg by mouth daily.   Yes [provider]  escitalopram (LEXAPRO) 10 MG tablet Take 10 mg by mouth daily.   Yes [provider]  gabapentin (NEURONTIN) 300 MG capsule Take 300 mg by mouth at bedtime.  Yes [provider]  latanoprost (XALATAN) 0.005 % ophthalmic solution Place 1 drop into both eyes at bedtime.   Yes [provider]  multivitamin (RENA-VIT) TABS tablet Take 1 tablet by mouth at bedtime. 10/21/17  Yes Eulogio Bear U, DO  sevelamer carbonate (RENVELA) 800 MG tablet Take 800 mg by mouth 3 (three) times daily with meals.   Yes [provider]  traMADol (ULTRAM) 50 MG tablet Take 0.5 tablets (25 mg total) by mouth every 12 (twelve) hours as needed for moderate pain. 10/21/17  Yes Geradine Girt, DO   Current Facility-Administered Medications  Medication Dose Route Frequency Provider Last Rate Last Dose  . 0.9 %  sodium chloride infusion  250 mL Intravenous PRN Sofia, Leslie K, PA-C      .  acetaminophen (TYLENOL) tablet 650 mg  650 mg Oral Q6H PRN Caryl Ada K, PA-C       Or  . acetaminophen (TYLENOL) suppository 650 mg  650 mg Rectal Q6H PRN Sofia, Leslie K, PA-C      . amLODipine (NORVASC) tablet 10 mg  10 mg Oral Daily Sofia, Leslie K, [place]      . aspirin EC tablet 81 mg  81 mg Oral Daily Sofia, Leslie K, [place]      . calcium acetate (PHOSLO) capsule 667 mg  667 mg Oral TID WC Sofia, Leslie K, PA-C      . carvedilol (COREG) tablet 12.5 mg  12.5 mg Oral BID WC Sofia, Leslie K, [place]      . [START ON 11/11/2017] Chlorhexidine Gluconate Cloth 2 % PADS 6 each  6 each Topical Q0600 Alric Seton, PA-C      . clopidogrel (PLAVIX) tablet 75 mg  75 mg Oral Daily Sofia, Leslie K, [place]      . [START ON 11/12/2017] doxercalciferol (HECTOROL) injection 1 mcg  1 mcg Intravenous Q T,Th,Sa-HD Alric Seton, PA-C      . escitalopram (LEXAPRO) tablet 10 mg  10 mg Oral Daily Sofia, Leslie K, [place]      . [START ON 11/12/2017] ferric gluconate (NULECIT) 250 mg in sodium chloride 0.9 % 100 mL IVPB  250 mg Intravenous Q T,Th,Sa-HD Alric Seton, PA-C      . gabapentin (NEURONTIN) capsule 300 mg  300 mg Oral QHS Sofia, Leslie K, PA-C      . heparin injection 5,000 Units  5,000 Units Subcutaneous Q8H Sofia, Leslie K, PA-C      . insulin aspart (novoLOG) injection 0-15 Units  0-15 Units Subcutaneous Q4H Sofia, Leslie K, PA-C      . latanoprost (XALATAN) 0.005 % ophthalmic solution 1 drop  1 drop Both Eyes QHS Sofia, Leslie K, [place]      . multivitamin (RENA-VIT) tablet 1 tablet  1 tablet Oral QHS Sofia, Leslie K, PA-C      . ondansetron [facility]) tablet 4 mg  4 mg Oral Q6H PRN Caryl Ada K, PA-C       Or  . ondansetron [employer]) injection 4 mg  4 mg Intravenous Q6H PRN Sofia, Leslie K, PA-C      . sevelamer carbonate (RENVELA) tablet 800 mg  800 mg Oral TID WC Caryl Ada K, PA-C      . sodium chloride flush (NS) 0.9 % injection 3 mL  3 mL Intravenous Q12H Sofia, Leslie K, PA-C      . sodium  chloride flush (NS) 0.9 % injection 3 mL  3 mL Intravenous PRN Caryl Ada K, PA-C      .  traMADol (ULTRAM) tablet 25 mg  25 mg Oral Q12H PRN Fransico Meadow, [place]       Current Outpatient Medications  Medication Sig Dispense Refill  . amLODipine (NORVASC) 10 MG tablet Take 10 mg by mouth daily.    Marland Kitchen aspirin EC 81 MG tablet Take 81 mg by mouth daily.    . calcium acetate (PHOSLO) 667 MG capsule Take 667 mg by mouth 3 (three) times daily with meals.    . carvedilol (COREG) 12.5 MG tablet Take 12.5 mg by mouth 2 (two) times daily with a meal.    . clopidogrel (PLAVIX) 75 MG tablet Take 75 mg by mouth daily.    Marland Kitchen escitalopram (LEXAPRO) 10 MG tablet Take 10 mg by mouth daily.    Marland Kitchen gabapentin (NEURONTIN) 300 MG capsule Take 300 mg by mouth at bedtime.    Marland Kitchen latanoprost (XALATAN) 0.005 % ophthalmic solution Place 1 drop into both eyes at bedtime.    . multivitamin (RENA-VIT) TABS tablet Take 1 tablet by mouth at bedtime. 30 tablet 0  . sevelamer carbonate (RENVELA) 800 MG tablet Take 800 mg by mouth 3 (three) times daily with meals.    . traMADol (ULTRAM) 50 MG tablet Take 0.5 tablets (25 mg total) by mouth every 12 (twelve) hours as needed for moderate pain. 6 tablet 0   Labs: Basic Metabolic Panel: Recent Labs  Lab 11/10/17 1140 11/10/17 1159  NA 140 138  K 6.6* 6.5*  CL 107 109  CO2 17*  --   GLUCOSE 178* 181*  BUN 139* 125*  CREATININE 13.53* 13.90*  CALCIUM 7.6*  --    Liver Function Tests: No results for input(s): AST, ALT, ALKPHOS, BILITOT, PROT, ALBUMIN in the last 168 hours. No results for input(s): LIPASE, AMYLASE in the last 168 hours. No results for input(s): AMMONIA in the last 168 hours. CBC: Recent Labs  Lab 11/10/17 1140 11/10/17 1159  WBC 3.4*  --   HGB 8.4* 9.2*  HCT 27.9* 27.0*  MCV 80.6  --   PLT 157  --    Cardiac Enzymes: No results for input(s): CKTOTAL, CKMB, CKMBINDEX, TROPONINI in the last 168 hours. CBG: No results for input(s): GLUCAP in the  last 168 hours. Iron Studies: No results for input(s): IRON, TIBC, TRANSFERRIN, FERRITIN in the last 72 hours. Studies/Results: Dg Chest Portable 1 View  Result Date: 11/10/2017 CLINICAL DATA:  Shortness of breath.  CHF.  Kidney disease. EXAM: PORTABLE CHEST 1 VIEW COMPARISON:  10/18/2017. FINDINGS: Cardiomegaly with diffuse pulmonary interstitial prominence. Interstitial prominence is increased slightly from prior study of 10/18/2017. These findings are consistent with CHF with progressive pulmonary interstitial edema. Pneumonitis cannot be excluded. No pneumothorax. IMPRESSION: Cardiomegaly with interstitial pulmonary edema. Interim slight progression from prior exam. Electronically Signed   By: [facility]   On: 11/10/2017 12:36    ROS: As per HPI otherwise negative.  Physical Exam: Vitals:   11/10/17 1415 11/10/17 1430 11/10/17 1445 11/10/17 1515  BP: (!) 145/92 (!) 153/84 (!) 142/86 (!) 157/97  Pulse: 65 65 64 64  Resp: 16 15 14 14   SpO2: 100% 100% 100% 100%     General:  Obese edematous, some SOB sats ok on supplemental O2, uremic odor Head: NCAT sclera not icteric MMM Neck: Supple.  Lungs: bilateral crackles, dim BS Heart: RRR with S1 S2.  Abdomen: obese soft NT + BS Lower extremities: +++edema Neuro: A & O  X 3. Moves all extremities spontaneously.+ asterxis Psych:  Responds to  questions appropriately with unusual affect - not sure what is baseline or if some is related to uremia, drowsy Dialysis Access: left upper AVF + bruit  Dialysis Orders:  [facility] to [facility] TTS 4hr 2K 2.25 Ca EDW 97.5 left upper AVF hectorol 1, venofer 100 x 10 heparin 7 K mircera 100 q 2 weeks - last dose 6/1  Assessment/Plan: 1. Hyperkalemia with uremia due to missed HD - last HD 6/1 - serial HD to lower K and toxins - run at 300/600 today and again tomorrow 2. ESRD -  TTS - in the process of transferring from [facility] to [facility] - closer to her appt; finalizing process with [facility], metabolic acidosis   3. Hypertension/volume  - lower volume 4. Anemia  - hgb 8.3 actually up from 7/5 6/1 - not due for ESA yet - replete Fe 5. Metabolic bone disease -  Continue VDRA 6. Nutrition - renal diet/fluid restriction 7. Noncompliance with dialysis (prior history per old records) - she indicates it is due to a lack of cash for taxi-SW at her HD unit trying to help with transportation/Medicaid.  Myriam Jacobson, PA-C [place] [phone] 11/10/2017, 3:51 PM   Pt seen, examined and agree w A/P as above.  Kelly Splinter MD [employer] pager [phone]   11/10/2017, 5:29 PM
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
## 2017-11-10 NOTE — Discharge Planning (Signed)
[facility] consulted regarding transportation to HD.  Pt moved to [place] from [place] having [facility].  SW spoke with pt on last visit and pt was instructed to apply for [place] medicaid ASAP to enroll in SCAT services.  [facility] will consult EDSW to review transportation issues with pt.

## 2017-11-10 NOTE — ED Notes (Signed)
Placed pt on 2L [place] for comfort

## 2017-11-10 NOTE — Procedures (Signed)
   I was present at this dialysis session, have reviewed the session itself and made  appropriate changes Kelly Splinter MD [place] pager [phone]   11/10/2017, 5:29 PM

## 2017-11-10 NOTE — ED Provider Notes (Signed)
Emergency Department Provider Note   I have reviewed the triage vital signs and the nursing notes.   HISTORY  Chief Complaint Vascular Access Problem   HPI Lauren Warner is a 52 y.o. female with PMH of CHF, ESRD on HD, HLD, elevated BMI, asthma, and IDDM presents to the emergency department for evaluation of shortness of breath, lower extremity edema, generalized weakness setting of missing dialysis for over 1 week.  Patient last had dialysis 10 days ago.  She states that she has dialysis in [facility] but lives in [place].  He states that she missed because of lack of transportation.  That she does not qualify for shuttle service and it will cost her $60 round trip to take a taxi.  She lives alone and does not have friends or family to help her with transportation.  She has been missing dialysis but began to feel especially bad over the last several days with shortness of breath is worse with lying flat.  She denies any fevers or shaking chills but has noticed fatigue.  No abdominal pain.  She feels that her lower extremities are more swollen than normal.  Past Medical History:  Diagnosis Date  . Anemia   . Anxiety   . Asthma   . CHF (congestive heart failure) ([place])   . Coronary artery disease   . Daily headache   . Depression   . ESRD (end stage renal disease) on dialysis ([place])    "[facility]; TTS" (10/19/2017)  . High cholesterol   . History of blood transfusion 10/19/2017   "anemia"  . Hypertension   . On home oxygen therapy    "2L; daily" (10/19/2017)  . Pneumonia    "several times" (10/19/2017)  . Pulmonary embolism ([place]) 2012  . Type 1 diabetes mellitus [facility])     Patient Active Problem List   Diagnosis Date Noted  . Pressure injury of skin 10/20/2017  . Essential hypertension 10/19/2017  . CAD (coronary artery disease) 10/19/2017  . ESRD (end stage renal disease) ([place]) 10/19/2017  . Syncope 10/19/2017  . Anemia due to end stage renal disease ([place]) 10/19/2017  .  Closed left ankle fracture 10/19/2017  . Nausea vomiting and diarrhea 10/19/2017  . DM (diabetes mellitus), type 2 with renal complications ([place]) 61/95/0932  . Acute respiratory failure with hypoxia ([place]) 10/18/2017    Past Surgical History:  Procedure Laterality Date  . AV FISTULA PLACEMENT Left 2018  . CESAREAN SECTION  1989  . CORONARY ANGIOPLASTY WITH STENT PLACEMENT  ~ 08/2017   "3 stents" (10/19/2017)  . ENDOMETRIAL ABLATION  ~ 2011    Current Outpatient Rx  . Order #: [id number] Class: No Print  . Order #: [id number] Class: Historical Med  . Order #: [id number] Class: Historical Med  . Order #: [id number] Class: Historical Med  . Order #: [id number] Class: Historical Med  . Order #: [id number] Class: Historical Med  . Order #: [id number] Class: Historical Med  . Order #: [id number] Class: Historical Med  . Order #: [id number] Class: Historical Med  . Order #: [id number] Class: Historical Med  . Order #: [id number] Class: Historical Med  . Order #: [id number] Class: Normal  . Order #: [id number] Class: Historical Med  . Order #: [id number] Class: Print  . Order #: [id number] Class: Print  . Order #: [id number] Class: Normal    Allergies Hydrocodone-acetaminophen and Metformin and related  Family History  Problem Relation Age of Onset  . Stroke Sister     Social History Social History   Tobacco Use  . Smoking status: Never Smoker  .  Smokeless tobacco: Never Used  Substance Use Topics  . Alcohol use: Never    Frequency: Never  . Drug use: Never    Review of Systems  Constitutional: No fever/chills. Positive fatigue.  Eyes: No visual changes. ENT: No sore throat. Cardiovascular: Denies chest pain. Respiratory: Positive shortness of breath. Gastrointestinal: No abdominal pain.  No nausea, no vomiting.  No diarrhea.  No constipation. Genitourinary: Negative for dysuria. Musculoskeletal: Negative for back pain. Positive LE edema.  Skin: Negative for rash. Neurological: Negative for  headaches, focal weakness or numbness.  10-point ROS otherwise negative.  ____________________________________________   PHYSICAL EXAM:  VITAL SIGNS: ED Triage Vitals  Enc Vitals Group     BP 11/10/17 1141 134/83     Pulse Rate 11/10/17 1141 72     Resp 11/10/17 1141 19     SpO2 11/10/17 1141 94 %     Pain Score 11/10/17 1139 3   Vitals:   11/10/17 1445 11/10/17 1515  BP: (!) 142/86 (!) 157/97  Pulse: 64 64  Resp: 14 14  SpO2: 100% 100%    Constitutional: Alert and oriented. Appears chronically ill and somewhat somnolent but able to provide a full history.  Eyes: Conjunctivae are normal.  Head: Atraumatic. Nose: No congestion/rhinnorhea. Mouth/Throat: Mucous membranes are moist.  Neck: No stridor.  Cardiovascular: Normal rate, regular rhythm. Good peripheral circulation. Grossly normal heart sounds.   Respiratory: Increased respiratory effort.  No retractions. Lungs diminished at the bases with rales.  Gastrointestinal: Soft and nontender. No distention.  Musculoskeletal: No lower extremity tenderness and 3+ pitting edema bilaterally. No gross deformities of extremities. Neurologic:  Normal speech and language. No gross focal neurologic deficits are appreciated.  Skin:  Skin is warm, dry and intact. No rash noted.  ____________________________________________   LABS (all labs ordered are listed, but only abnormal results are displayed)  Labs Reviewed  BASIC METABOLIC PANEL - Abnormal; Notable for the following components:      Result Value   Potassium 6.6 (*)    CO2 17 (*)    Glucose, Bld 178 (*)    BUN 139 (*)    Creatinine, Ser 13.53 (*)    Calcium 7.6 (*)    GFR calc non Af Amer 3 (*)    GFR calc Af Amer 3 (*)    Anion gap 16 (*)    All other components within normal limits  CBC - Abnormal; Notable for the following components:   WBC 3.4 (*)    RBC 3.46 (*)    Hemoglobin 8.4 (*)    HCT 27.9 (*)    MCH 24.3 (*)    RDW 19.4 (*)    All other components  within normal limits  I-STAT CHEM 8, ED - Abnormal; Notable for the following components:   Potassium 6.5 (*)    BUN 125 (*)    Creatinine, Ser 13.90 (*)    Glucose, Bld 181 (*)    Calcium, Ion 0.90 (*)    TCO2 18 (*)    Hemoglobin 9.2 (*)    HCT 27.0 (*)    All other components within normal limits  I-STAT TROPONIN, ED   ____________________________________________  EKG   EKG Interpretation  Date/Time:  Tuesday November 10 2017 11:41:42 EDT Ventricular Rate:  72 PR Interval:    QRS Duration: 92 QT Interval:  422 QTC Calculation: 462 R Axis:   48 Text Interpretation:  Sinus rhythm Low voltage, extremity leads No STEMI.  Confirmed by Nanda Quinton [phone]) on  11/10/2017 12:07:57 PM       ____________________________________________  RADIOLOGY  Dg Chest Portable 1 View  Result Date: 11/10/2017 CLINICAL DATA:  Shortness of breath.  CHF.  Kidney disease. EXAM: PORTABLE CHEST 1 VIEW COMPARISON:  10/18/2017. FINDINGS: Cardiomegaly with diffuse pulmonary interstitial prominence. Interstitial prominence is increased slightly from prior study of 10/18/2017. These findings are consistent with CHF with progressive pulmonary interstitial edema. Pneumonitis cannot be excluded. No pneumothorax. IMPRESSION: Cardiomegaly with interstitial pulmonary edema. Interim slight progression from prior exam. Electronically Signed   By: [place]   On: 11/10/2017 12:36    ____________________________________________   PROCEDURES  Procedure(s) performed:   .Critical Care Performed by: Margette Fast, MD Authorized by: Margette Fast, MD   Critical care provider statement:    Critical care time (minutes):  35   Critical care time was exclusive of:  Separately billable procedures and treating other patients and teaching time   Critical care was necessary to treat or prevent imminent or life-threatening deterioration of the following conditions:  Renal failure, respiratory failure and  metabolic crisis   Critical care was time spent personally by me on the following activities:  Blood draw for specimens, development of treatment plan with patient or surrogate, discussions with consultants, evaluation of patient's response to treatment, examination of patient, obtaining history from patient or surrogate, ordering and performing treatments and interventions, ordering and review of laboratory studies, ordering and review of radiographic studies, pulse oximetry, review of old charts and re-evaluation of patient's condition   I assumed direction of critical care for this patient from another provider in my specialty: no      ____________________________________________   INITIAL IMPRESSION / [place] / ED COURSE  Pertinent labs & imaging results that were available during my care of the patient were reviewed by me and considered in my medical decision making (see chart for details).  Patient presents to the emergency department for evaluation of generalized weakness with shortness of breath and clinical volume overload in the setting of missing dialysis for the past 10 days.  Her i-STAT labs show potassium greater than 6.  EKG shows no acute findings of hyperkalemia other than borderline long QT.  Patient appears as if she will need urgent hemodialysis.  Chest x-ray shows diffuse edema worse than prior.  Plan to discuss case with nephrology.  Also placed a case management consultation to assist with her underlying issue which appears to be transportation to dialysis.   Spoke with Nephrology Dr. Joelyn Oms who will come down to evaluate and arrange HD. Labs and imaging reviewed with hyperkalemia and pulmonary edema. No BiPAP requirement at this time.   Discussed patient's case with Hospitalist to request admission. Patient and family (if present) updated with plan. Care transferred to Hospitalist service.  I reviewed all nursing notes, vitals, pertinent old records, EKGs,  labs, imaging (as available).  ____________________________________________  FINAL CLINICAL IMPRESSION(S) / ED DIAGNOSES  Final diagnoses:  Hyperkalemia  Acute pulmonary edema (HCC)  ESRD (end stage renal disease) (HCC)     MEDICATIONS GIVEN DURING THIS VISIT:  Medications  Chlorhexidine Gluconate Cloth 2 % PADS 6 each (has no administration in time range)  ferric gluconate (NULECIT) 250 mg in sodium chloride 0.9 % 100 mL IVPB (has no administration in time range)  doxercalciferol (HECTOROL) injection 1 mcg (has no administration in time range)  calcium gluconate 1 g in sodium chloride 0.9 % 100 mL IVPB (1 g Intravenous New Bag/Given 11/10/17 1450)  Note:  This document was prepared using Dragon voice recognition software and may include unintentional dictation errors.  Nanda Quinton, MD Emergency Medicine    Long, [facility], MD 11/10/17 [phone]

## 2017-11-10 NOTE — ED Notes (Signed)
Admitting at the bedside.  

## 2017-11-11 ENCOUNTER — Encounter (HOSPITAL_COMMUNITY): Payer: Self-pay | Admitting: General Practice

## 2017-11-11 ENCOUNTER — Other Ambulatory Visit: Payer: Self-pay

## 2017-11-11 DIAGNOSIS — Z9119 Patient's noncompliance with other medical treatment and regimen: Secondary | ICD-10-CM

## 2017-11-11 DIAGNOSIS — I10 Essential (primary) hypertension: Secondary | ICD-10-CM

## 2017-11-11 DIAGNOSIS — E889 Metabolic disorder, unspecified: Secondary | ICD-10-CM

## 2017-11-11 DIAGNOSIS — Z992 Dependence on renal dialysis: Secondary | ICD-10-CM

## 2017-11-11 DIAGNOSIS — M9080 Osteopathy in diseases classified elsewhere, unspecified site: Secondary | ICD-10-CM

## 2017-11-11 LAB — BASIC METABOLIC PANEL
Anion gap: 12 (ref 5–15)
BUN: 53 mg/dL — ABNORMAL HIGH (ref 6–20)
CALCIUM: 8 mg/dL — AB (ref 8.9–10.3)
CO2: 25 mmol/L (ref 22–32)
CREATININE: 7.72 mg/dL — AB (ref 0.44–1.00)
Chloride: 103 mmol/L (ref 101–111)
GFR, EST AFRICAN AMERICAN: 6 mL/min — AB (ref >60)
GFR, EST NON AFRICAN AMERICAN: 5 mL/min — AB (ref >60)
Glucose, Bld: 159 mg/dL — ABNORMAL HIGH (ref 65–99)
Potassium: 4.9 mmol/L (ref 3.5–5.1)
Sodium: 140 mmol/L (ref 135–145)

## 2017-11-11 LAB — RENAL FUNCTION PANEL
Albumin: 3.4 g/dL — ABNORMAL LOW (ref 3.5–5.0)
Anion gap: 11 (ref 5–15)
BUN: 17 mg/dL (ref 6–20)
CO2: 30 mmol/L (ref 22–32)
Calcium: 8.4 mg/dL — ABNORMAL LOW (ref 8.9–10.3)
Chloride: 100 mmol/L — ABNORMAL LOW (ref 101–111)
Creatinine, Ser: 4.02 mg/dL — ABNORMAL HIGH (ref 0.44–1.00)
GFR calc Af Amer: 14 mL/min — ABNORMAL LOW (ref >60)
GFR calc non Af Amer: 12 mL/min — ABNORMAL LOW (ref >60)
Glucose, Bld: 127 mg/dL — ABNORMAL HIGH (ref 65–99)
Phosphorus: 3.9 mg/dL (ref 2.5–4.6)
Potassium: 3.7 mmol/L (ref 3.5–5.1)
Sodium: 141 mmol/L (ref 135–145)

## 2017-11-11 LAB — GLUCOSE, CAPILLARY
GLUCOSE-CAPILLARY: 100 mg/dL — AB (ref 65–99)
GLUCOSE-CAPILLARY: 100 mg/dL — AB (ref 65–99)
GLUCOSE-CAPILLARY: 156 mg/dL — AB (ref 65–99)
GLUCOSE-CAPILLARY: 172 mg/dL — AB (ref 65–99)
GLUCOSE-CAPILLARY: 89 mg/dL (ref 65–99)
GLUCOSE-CAPILLARY: 91 mg/dL (ref 65–99)

## 2017-11-11 LAB — CBC
HCT: 29.5 % — ABNORMAL LOW (ref 36.0–46.0)
Hemoglobin: 8.8 g/dL — ABNORMAL LOW (ref 12.0–15.0)
MCH: 24 pg — AB (ref 26.0–34.0)
MCHC: 29.8 g/dL — ABNORMAL LOW (ref 30.0–36.0)
MCV: 80.4 fL (ref 78.0–100.0)
PLATELETS: 172 10*3/uL (ref 150–400)
RBC: 3.67 MIL/uL — AB (ref 3.87–5.11)
RDW: 18.8 % — AB (ref 11.5–15.5)
WBC: 3.8 10*3/uL — AB (ref 4.0–10.5)

## 2017-11-11 LAB — MAGNESIUM: Magnesium: 2.2 mg/dL (ref 1.7–2.4)

## 2017-11-11 LAB — HEPATITIS B SURFACE ANTIGEN: Hepatitis B Surface Ag: NEGATIVE

## 2017-11-11 MED ORDER — GABAPENTIN 300 MG PO CAPS
300.0000 mg | ORAL_CAPSULE | Freq: Two times a day (BID) | ORAL | Status: DC
  Administered 2017-11-11 – 2017-11-12 (×2): 300 mg via ORAL
  Filled 2017-11-11 (×2): qty 1

## 2017-11-11 MED ORDER — HEPARIN SODIUM (PORCINE) 1000 UNIT/ML DIALYSIS
7000.0000 [IU] | Freq: Once | INTRAMUSCULAR | Status: AC
  Administered 2017-11-12: 7000 [IU] via INTRAVENOUS_CENTRAL
  Filled 2017-11-11: qty 7

## 2017-11-11 MED ORDER — ALTEPLASE 2 MG IJ SOLR: 2.0000 mg | Freq: Once | INTRAMUSCULAR | Status: DC | PRN

## 2017-11-11 MED ORDER — ACETAMINOPHEN 325 MG PO TABS
ORAL_TABLET | ORAL | Status: AC
  Filled 2017-11-11: qty 2

## 2017-11-11 MED ORDER — PENTAFLUOROPROP-TETRAFLUOROETH EX AERO: 1.0000 "application " | INHALATION_SPRAY | CUTANEOUS | Status: DC | PRN

## 2017-11-11 MED ORDER — SODIUM CHLORIDE 0.9 % IV SOLN: 100.0000 mL | INTRAVENOUS | Status: DC | PRN

## 2017-11-11 MED ORDER — CARVEDILOL 25 MG PO TABS
25.0000 mg | ORAL_TABLET | Freq: Two times a day (BID) | ORAL | Status: DC
  Administered 2017-11-12: 25 mg via ORAL
  Filled 2017-11-11: qty 1

## 2017-11-11 MED ORDER — CHLORHEXIDINE GLUCONATE CLOTH 2 % EX PADS: 6.0000 | MEDICATED_PAD | Freq: Every day | CUTANEOUS | Status: DC

## 2017-11-11 MED ORDER — HEPARIN SODIUM (PORCINE) 1000 UNIT/ML DIALYSIS
20.0000 [IU]/kg | INTRAMUSCULAR | Status: DC | PRN
  Filled 2017-11-11: qty 2

## 2017-11-11 MED ORDER — CARVEDILOL 12.5 MG PO TABS
12.5000 mg | ORAL_TABLET | Freq: Once | ORAL | Status: AC
  Administered 2017-11-11: 12.5 mg via ORAL
  Filled 2017-11-11: qty 1

## 2017-11-11 MED ORDER — HEPARIN SODIUM (PORCINE) 1000 UNIT/ML DIALYSIS: 1000.0000 [IU] | INTRAMUSCULAR | Status: DC | PRN

## 2017-11-11 MED ORDER — LIDOCAINE-PRILOCAINE 2.5-2.5 % EX CREA: 1.0000 "application " | TOPICAL_CREAM | CUTANEOUS | Status: DC | PRN

## 2017-11-11 MED ORDER — LIDOCAINE HCL (PF) 1 % IJ SOLN: 5.0000 mL | INTRAMUSCULAR | Status: DC | PRN

## 2017-11-11 NOTE — Progress Notes (Signed)
[facility] Progress Note  Subjective: feeling a little better, legs aren't as heavy, no SOB, mild cough  Vitals:   11/11/17 0002 11/11/17 0431 11/11/17 0726 11/11/17 1150  BP: (!) 136/103 (!) 170/96 (!) 177/88 130/78  Pulse: 88 77 81 75  Resp: 16 15 13 19   Temp: 98.2 F (36.8 C) 98.5 F (36.9 C)    TempSrc: Oral Oral    SpO2: 100% 99% 100% 99%  Weight:      Height:        Inpatient medications: . amLODipine  10 mg Oral Daily  . aspirin EC  81 mg Oral Daily  . calcium acetate  667 mg Oral TID WC  . carvedilol  12.5 mg Oral BID WC  . Chlorhexidine Gluconate Cloth  6 each Topical Q0600  . clopidogrel  75 mg Oral Daily  . [START ON 11/12/2017] doxercalciferol  1 mcg Intravenous Q T,Th,Sa-HD  . escitalopram  10 mg Oral Daily  . gabapentin  300 mg Oral QHS  . heparin  5,000 Units Subcutaneous Q8H  . insulin aspart  0-15 Units Subcutaneous Q4H  . latanoprost  1 drop Both Eyes QHS  . multivitamin  1 tablet Oral QHS  . sevelamer carbonate  800 mg Oral TID WC  . sodium chloride flush  3 mL Intravenous Q12H   . sodium chloride    . sodium chloride    . sodium chloride    . [START ON 11/12/2017] ferric gluconate (FERRLECIT/NULECIT) IV     sodium chloride, sodium chloride, sodium chloride, acetaminophen **OR** acetaminophen, alteplase, heparin, lidocaine (PF), lidocaine-prilocaine, ondansetron **OR** ondansetron (ZOFRAN) IV, pentafluoroprop-tetrafluoroeth, sodium chloride flush, traMADol  Exam: General:  Obese edematous, some SOB sats ok on supplemental O2, uremic odor Head: NCAT sclera not icteric MMM Neck: Supple.  Lungs: bilateral crackles, dim BS Heart: RRR with S1 S2.  Abdomen: obese soft NT + BS Lower extremities: +++edema Neuro: A & O  X 3. Moves all extremities spontaneously.+ asterxis Psych:  Responds to questions appropriately with unusual affect - not sure what is baseline or if some is related to uremia, drowsy Dialysis Access: left upper AVF +  bruit  Dialysis Orders:  [facility] transferring to [facility] TTS  4h  2/2.25 bath  LUA AVF  97.5kg  Heparin 7000 - hectorol 1  - venofer 100 x 10  - mircera 100 q 2 weeks - last dose 6/1  Assessment/Plan: 1. Hyperkalemia with uremia due to missed HD - last HD 6/1. Plan HD again today. Will plan for HD tomorrow as well , possibly as OP.  2. ESRD -  TTS - in the process of transferring from [facility] to [facility] - closer to her appt; finalizing process with CLIP 3. Hypertension/volume  - lower volume still up 7kg 4. Anemia  - hgb 8.3 actually up from 7/5 6/1 - not due for ESA yet - replete Fe 5. Metabolic bone disease -  Continue VDRA 6. Nutrition - renal diet/fluid restriction 7. Noncompliance with dialysis (prior history per old records) - she indicates it is due to a lack of cash for taxi-SW at her HD unit trying to help with transportation/Medicaid.   Plan - as above   Kelly Splinter MD [facility] pager [phone]   11/11/2017, 12:00 PM   Recent Labs  Lab 11/10/17 1140 11/10/17 1159 11/11/17 1019  NA 140 138 140  K 6.6* 6.5* 4.9  CL 107 109 103  CO2 17*  --  25  GLUCOSE 178* 181*  159*  BUN 139* 125* 53*  CREATININE 13.53* 13.90* 7.72*  CALCIUM 7.6*  --  8.0*   No results for input(s): AST, ALT, ALKPHOS, BILITOT, PROT, ALBUMIN in the last 168 hours. Recent Labs  Lab 11/10/17 1140 11/10/17 1159 11/11/17 1019  WBC 3.4*  --  3.8*  HGB 8.4* 9.2* 8.8*  HCT 27.9* 27.0* 29.5*  MCV 80.6  --  80.4  PLT 157  --  172   Iron/TIBC/Ferritin/ %Sat No results found for: IRON, TIBC, FERRITIN, IRONPCTSAT

## 2017-11-11 NOTE — Progress Notes (Addendum)
Patient is set up to start at [facility] dialysis on TTS 2nd shift schedule.  She will need to be there her first treatment there at noon.  I am told by the dialysis staff she is cleared to get transportation via SCAT, she just needs to call and arrange transportation.  Amalia Hailey, PA-C

## 2017-11-11 NOTE — Progress Notes (Signed)
PROGRESS NOTE    Lauren Warner  UVO:[id number] DOB: 1965-11-17 DOA: 11/10/2017 PCP: [name], [name]   Brief Narrative:   52 y.o. BF PMHx  anxiety, chronic respiratory failure on 2 L O2 at home, PE, diabetes type 1, CAD S/P stents x3 CHF, ESRD on HD T/Th/Sat, for about a year who moved to [place] from [place] without a HD unit.  She came here because she visited and stand with her grandmother and liked it here.  She reports she has been able to secure an apartment but did not have the cash for a taxi to drive to dialysis.  She has been only been to 3 dialysis treatments since her May discharge from [facility]. Prior records indicate a history of noncompliance. She is in the process of transferring [facility] to [facility] HD unit.    When transportation came today to take her to dialysis, she felt very bad and the bad and decided to come to the hospital.  She is SOB, weak, tired, sleepy.  She has had some N, V, D and makes a little urine.  Missing dialysis has made it difficult for the SW to help her with the necessary arrangements for Medicaid and transportation.    Labs pre HD K 6.6 CO2 17 BUN 139 Cr 13.53 hgb 8.4 CXR interstitial pulmonary edema.    Subjective: 6/12 A/O x4, negative CP, negative S OB, negative abdominal pain.  States just moved here from [place] and initially was assigned to HD unit in [place] while she lived in [place].  Difficulty obtaining transportation to and from HD.    Assessment & Plan:   Active Problems:   Acute respiratory failure with hypoxia (HCC)   Essential hypertension   CAD (coronary artery disease)   ESRD (end stage renal disease) (HCC)   Nausea vomiting and diarrhea   DM (diabetes mellitus), type 2 with renal complications (HCC)   Hyperkalemia  ESRD on HD T/Th/Sat - Last HD 6/1 - Now reassigned to HD and [facility]. - Uremic however asymptomatic currently. - Electrolyte abnormalities improving. - Finalizing process for  CLIP  Noncompliance with HD (prior history per old records) - Per patient secondary to living in [place] and being assigned HD in [place]: High cost of taxi to and from appointment. - Patient reassign to [place] HD ( [facility] unit), should be resolved  Essential HTN -Control with HD - Morphine 10 mg daily - Coreg 12.5 mg BID  Hyperkalemia - Resolved with HD  Metabolic bone disease -Continue VD RA     DVT prophylaxis: Subcu heparin Code Status: Full Family Communication: None Disposition Plan: TBD: Per nephrology   Consultants:  Nephrology     Procedures/Significant Events:     I have personally reviewed and interpreted all radiology studies and my findings are as above.  VENTILATOR SETTINGS:    Cultures   Antimicrobials: Anti-infectives (From admission, onward)   None       Devices    LINES / TUBES:      Continuous Infusions: . sodium chloride    . sodium chloride    . sodium chloride    . [START ON 11/12/2017] ferric gluconate (FERRLECIT/NULECIT) IV       Objective: Vitals:   11/10/17 2136 11/11/17 0002 11/11/17 0431 11/11/17 0726  BP: (!) 207/92 (!) 136/103 (!) 170/96 (!) 177/88  Pulse: 92 88 77 81  Resp: (!) 24 16 15 13   Temp: 98.2 F (36.8 C) 98.2 F (  36.8 C) 98.5 F (36.9 C)   TempSrc: Oral Oral Oral   SpO2: 100% 100% 99% 100%  Weight: 232 lb 5.8 oz (105.4 kg)     Height: 5\' 7"  (1.702 m)       Intake/Output Summary (Last 24 hours) at 11/11/2017 4696 Last data filed at 11/10/2017 2300 Gross per 24 hour  Intake 120 ml  Output 4000 ml  Net -3880 ml   Filed Weights   11/10/17 2136  Weight: 232 lb 5.8 oz (105.4 kg)    Examination:  General: A/O x4, No acute respiratory distress Neck:  Negative scars, masses, torticollis, lymphadenopathy, JVD Lungs: Clear to auscultation bilaterally without wheezes or crackles Cardiovascular: Regular rate and rhythm without murmur gallop or rub normal S1 and S2 Abdomen:  Morbidly obese, negative abdominal pain, nondistended, positive soft, bowel sounds, no rebound, no ascites, no appreciable mass Extremities: No significant cyanosis, clubbing, or edema bilateral lower extremities Skin: Negative rashes, lesions, ulcers Psychiatric:  Negative depression, negative anxiety, negative fatigue, negative mania  Central nervous system:  Cranial nerves II through XII intact, tongue/uvula midline, all extremities muscle strength 5/5, sensation intact throughout, fnegative dysarthria, negative expressive aphasia, negative receptive aphasia.  .     Data Reviewed: Care during the described time interval was provided by me .  I have reviewed this patient's available data, including medical history, events of note, physical examination, and all test results as part of my evaluation.   CBC: Recent Labs  Lab 11/10/17 1140 11/10/17 1159  WBC 3.4*  --   HGB 8.4* 9.2*  HCT 27.9* 27.0*  MCV 80.6  --   PLT 157  --    Basic Metabolic Panel: Recent Labs  Lab 11/10/17 1140 11/10/17 1159  NA 140 138  K 6.6* 6.5*  CL 107 109  CO2 17*  --   GLUCOSE 178* 181*  BUN 139* 125*  CREATININE 13.53* 13.90*  CALCIUM 7.6*  --    GFR: Estimated Creatinine Clearance: 6 mL/min (A) (by C-G formula based on SCr of 13.9 mg/dL (H)). Liver Function Tests: No results for input(s): AST, ALT, ALKPHOS, BILITOT, PROT, ALBUMIN in the last 168 hours. No results for input(s): LIPASE, AMYLASE in the last 168 hours. No results for input(s): AMMONIA in the last 168 hours. Coagulation Profile: No results for input(s): INR, PROTIME in the last 168 hours. Cardiac Enzymes: No results for input(s): CKTOTAL, CKMB, CKMBINDEX, TROPONINI in the last 168 hours. BNP (last 3 results) No results for input(s): PROBNP in the last 8760 hours. HbA1C: Recent Labs    11/10/17 1140  HGBA1C 7.5*   CBG: Recent Labs  Lab 11/10/17 2140 11/11/17 0003 11/11/17 0429 11/11/17 0801  GLUCAP 90 172* 100* 89     Lipid Profile: No results for input(s): CHOL, HDL, LDLCALC, TRIG, CHOLHDL, LDLDIRECT in the last 72 hours. Thyroid Function Tests: No results for input(s): TSH, T4TOTAL, FREET4, T3FREE, THYROIDAB in the last 72 hours. Anemia Panel: No results for input(s): VITAMINB12, FOLATE, FERRITIN, TIBC, IRON, RETICCTPCT in the last 72 hours. Urine analysis:    Component Value Date/Time   COLORURINE YELLOW 10/18/2017 2036   APPEARANCEUR CLEAR 10/18/2017 2036   LABSPEC 1.014 10/18/2017 2036   PHURINE 6.0 10/18/2017 2036   GLUCOSEU >=500 (A) 10/18/2017 2036   HGBUR SMALL (A) 10/18/2017 2036   [place] NEGATIVE 10/18/2017 2036   KETONESUR NEGATIVE 10/18/2017 2036   PROTEINUR >=300 (A) 10/18/2017 2036   NITRITE NEGATIVE 10/18/2017 2036   LEUKOCYTESUR NEGATIVE 10/18/2017 2036  Sepsis Labs: @LABRCNTIP (procalcitonin:4,lacticidven:4)  )No results found for this or any previous visit (from the past 240 hour(s)).       Radiology Studies: Dg Chest Portable 1 View  Result Date: 11/10/2017 CLINICAL DATA:  Shortness of breath.  CHF.  Kidney disease. EXAM: PORTABLE CHEST 1 VIEW COMPARISON:  10/18/2017. FINDINGS: Cardiomegaly with diffuse pulmonary interstitial prominence. Interstitial prominence is increased slightly from prior study of 10/18/2017. These findings are consistent with CHF with progressive pulmonary interstitial edema. Pneumonitis cannot be excluded. No pneumothorax. IMPRESSION: Cardiomegaly with interstitial pulmonary edema. Interim slight progression from prior exam. Electronically Signed   By: [place]   On: 11/10/2017 12:36        Scheduled Meds: . amLODipine  10 mg Oral Daily  . aspirin EC  81 mg Oral Daily  . calcium acetate  667 mg Oral TID WC  . carvedilol  12.5 mg Oral BID WC  . Chlorhexidine Gluconate Cloth  6 each Topical Q0600  . Chlorhexidine Gluconate Cloth  6 each Topical Q0600  . clopidogrel  75 mg Oral Daily  . [START ON 11/12/2017] doxercalciferol  1  mcg Intravenous Q T,Th,Sa-HD  . escitalopram  10 mg Oral Daily  . gabapentin  300 mg Oral QHS  . heparin  5,000 Units Subcutaneous Q8H  . insulin aspart  0-15 Units Subcutaneous Q4H  . latanoprost  1 drop Both Eyes QHS  . multivitamin  1 tablet Oral QHS  . sevelamer carbonate  800 mg Oral TID WC  . sodium chloride flush  3 mL Intravenous Q12H   Continuous Infusions: . sodium chloride    . sodium chloride    . sodium chloride    . [START ON 11/12/2017] ferric gluconate (FERRLECIT/NULECIT) IV       LOS: 1 day    Time spent: 40 minutes    WOODS, Geraldo Docker, MD [facility] Pager [phone]   If 7PM-7AM, please contact night-coverage [email or web address] Password [facility] 11/11/2017, 9:24 AM

## 2017-11-12 LAB — BASIC METABOLIC PANEL
Anion gap: 13 (ref 5–15)
BUN: 21 mg/dL — AB (ref 6–20)
CALCIUM: 8.3 mg/dL — AB (ref 8.9–10.3)
CO2: 28 mmol/L (ref 22–32)
CREATININE: 4.7 mg/dL — AB (ref 0.44–1.00)
Chloride: 102 mmol/L (ref 101–111)
GFR calc Af Amer: 12 mL/min — ABNORMAL LOW (ref >60)
GFR, EST NON AFRICAN AMERICAN: 10 mL/min — AB (ref >60)
GLUCOSE: 115 mg/dL — AB (ref 65–99)
Potassium: 4.4 mmol/L (ref 3.5–5.1)
Sodium: 143 mmol/L (ref 135–145)

## 2017-11-12 LAB — CBC
HCT: 30.6 % — ABNORMAL LOW (ref 36.0–46.0)
Hemoglobin: 9.2 g/dL — ABNORMAL LOW (ref 12.0–15.0)
MCH: 24.2 pg — AB (ref 26.0–34.0)
MCHC: 30.1 g/dL (ref 30.0–36.0)
MCV: 80.5 fL (ref 78.0–100.0)
PLATELETS: 167 10*3/uL (ref 150–400)
RBC: 3.8 MIL/uL — ABNORMAL LOW (ref 3.87–5.11)
RDW: 18.1 % — AB (ref 11.5–15.5)
WBC: 3.7 10*3/uL — ABNORMAL LOW (ref 4.0–10.5)

## 2017-11-12 LAB — GLUCOSE, CAPILLARY
GLUCOSE-CAPILLARY: 163 mg/dL — AB (ref 65–99)
Glucose-Capillary: 125 mg/dL — ABNORMAL HIGH (ref 65–99)
Glucose-Capillary: 102 mg/dL — ABNORMAL HIGH (ref 65–99)

## 2017-11-12 LAB — MAGNESIUM: Magnesium: 2.1 mg/dL (ref 1.7–2.4)

## 2017-11-12 LAB — HEPATITIS B SURFACE ANTIGEN: HEP B S AG: NEGATIVE

## 2017-11-12 MED ORDER — HYDRALAZINE HCL 20 MG/ML IJ SOLN
10.0000 mg | Freq: Once | INTRAMUSCULAR | Status: AC
  Administered 2017-11-12: 10 mg via INTRAVENOUS
  Filled 2017-11-12: qty 1

## 2017-11-12 MED ORDER — CALCIUM ACETATE (PHOS BINDER) 667 MG PO CAPS: 667.0000 mg | ORAL_CAPSULE | Freq: Three times a day (TID) | ORAL | 0 refills | Status: DC

## 2017-11-12 MED ORDER — AMLODIPINE BESYLATE 10 MG PO TABS: 10.0000 mg | ORAL_TABLET | Freq: Every day | ORAL | 0 refills | Status: DC

## 2017-11-12 MED ORDER — ESCITALOPRAM OXALATE 10 MG PO TABS: 10.0000 mg | ORAL_TABLET | Freq: Every day | ORAL | 0 refills | Status: DC

## 2017-11-12 MED ORDER — DOXERCALCIFEROL 4 MCG/2ML IV SOLN
INTRAVENOUS | Status: AC
  Filled 2017-11-12: qty 2

## 2017-11-12 MED ORDER — OXYCODONE-ACETAMINOPHEN 5-325 MG PO TABS
2.0000 | ORAL_TABLET | Freq: Once | ORAL | Status: AC
  Administered 2017-11-12: 2 via ORAL
  Filled 2017-11-12: qty 2

## 2017-11-12 MED ORDER — GABAPENTIN 300 MG PO CAPS: 300.0000 mg | ORAL_CAPSULE | Freq: Every day | ORAL | 0 refills | Status: DC

## 2017-11-12 MED ORDER — CARVEDILOL 12.5 MG PO TABS: 12.5000 mg | ORAL_TABLET | Freq: Two times a day (BID) | ORAL | 0 refills | Status: DC

## 2017-11-12 MED ORDER — TRAMADOL HCL 50 MG PO TABS: 25.0000 mg | ORAL_TABLET | Freq: Two times a day (BID) | ORAL | 0 refills | Status: AC | PRN

## 2017-11-12 MED ORDER — OXYMETAZOLINE HCL 0.05 % NA SOLN
2.0000 | Freq: Two times a day (BID) | NASAL | Status: DC | PRN
  Filled 2017-11-12: qty 15

## 2017-11-12 MED ORDER — ASPIRIN EC 81 MG PO TBEC: 81.0000 mg | DELAYED_RELEASE_TABLET | Freq: Every day | ORAL | Status: DC

## 2017-11-12 MED ORDER — CLOPIDOGREL BISULFATE 75 MG PO TABS: 75.0000 mg | ORAL_TABLET | Freq: Every day | ORAL | 0 refills | Status: DC

## 2017-11-12 NOTE — Care Management Note (Signed)
Case Management Note Marvetta Gibbons RN, BSN Unit 4E-Case Manager [phone]  Patient Details  Name: Lauren Warner MRN: [id number] Date of Birth: 10/04/1965  Subjective/Objective:     Pt admitted with acute resp. Failure- had missed HD tx-               Action/Plan: PTA pt lived at home alone, moved here from [place] in May, assigned to HD in [facility] however has been having issues getting there due to cost of transportation- pt has not moved her Medicaid to [place] yet. Renal has been able to  clip pt to [facility] HD in [place] which should help resolve transportation issue spot will be T/T/S- CSW to see pt for SCAT assistance. Pt has a PCP-  Dr. Kathrynn Humble-  In speaking with pt she was able to get meds back in may at [facility] however CM will assist pt with [facility] letter this admit to insure pt gets her medications - [place] letter provided along with list of pharmacies to use- discussed copay cost and program use. Pt voices understanding. Pt also provided a cab voucher for transport home per CSW.  Pt does not have any family here- grandmother that lived here is deceased.   Expected Discharge Date:  11/12/17               Expected Discharge Plan:  Home/Self Care  In-House Referral:  Clinical Social Work  Discharge planning Services  CM Consult, Medication Assistance, [place] Program  Post Acute Care Choice:  NA Choice offered to:     DME Arranged:    DME Agency:     HH Arranged:    HH Agency:     Status of Service:  Completed, signed off  If discussed at [employer] of Stay Meetings, dates discussed:    Discharge Disposition: home/self care   Additional Comments:  Dawayne Patricia, RN 11/12/2017, 4:41 PM

## 2017-11-12 NOTE — Progress Notes (Addendum)
[facility] Progress Note  Subjective: feels much better. Needs ride home from hospital. Advised to ask floor SW for assistance.  Vitals:   11/12/17 0900 11/12/17 0930 11/12/17 1000 11/12/17 1030  BP: 140/79 123/65 120/70 (!) 170/90  Pulse: 73 73 74 76  Resp: 15     Temp:      TempSrc:      SpO2:      Weight:      Height:        Inpatient medications: . amLODipine  10 mg Oral Daily  . aspirin EC  81 mg Oral Daily  . calcium acetate  667 mg Oral TID WC  . carvedilol  25 mg Oral BID WC  . Chlorhexidine Gluconate Cloth  6 each Topical Q0600  . Chlorhexidine Gluconate Cloth  6 each Topical Q0600  . clopidogrel  75 mg Oral Daily  . doxercalciferol  1 mcg Intravenous Q T,Th,Sa-HD  . escitalopram  10 mg Oral Daily  . gabapentin  300 mg Oral BID  . heparin  5,000 Units Subcutaneous Q8H  . insulin aspart  0-15 Units Subcutaneous Q4H  . latanoprost  1 drop Both Eyes QHS  . multivitamin  1 tablet Oral QHS  . sevelamer carbonate  800 mg Oral TID WC  . sodium chloride flush  3 mL Intravenous Q12H   . sodium chloride    . sodium chloride    . sodium chloride    . ferric gluconate (FERRLECIT/NULECIT) IV 250 mg (11/12/17 1027)   sodium chloride, sodium chloride, sodium chloride, acetaminophen **OR** acetaminophen, alteplase, heparin, heparin, lidocaine (PF), lidocaine-prilocaine, ondansetron **OR** ondansetron (ZOFRAN) IV, oxymetazoline, pentafluoroprop-tetrafluoroeth, sodium chloride flush, traMADol  Exam: General: NAD facial edema resolved Lungs: no rales Heart: RRR with S1 S2.  Abdomen: obese soft NT + BS Lower extremities: edema resolved Neuro: A & O  X 3. Dialysis Access: left upper AVF + bruit  Dialysis Orders:  [facility] transferring to [facility] TTS  4h  2/2.25 bath  LUA AVF  97.5kg  Heparin 7000 - hectorol 1  - venofer 100 x 10  - mircera 100 q 2 weeks - last dose 6/1  Assessment/Plan: 1. Hyperkalemia with uremia due to missed HD - resolved with serial HD.Marland Kitchen   2. ESRD -  TTS -all arrangements made for her to start at [facility] on Saturday - needs to be there at noon 3. Hypertension/volume  -lowering with serial HD cramped today - BP dropped some - wt 96.2 - post HD - lower edw to 96.5  4. Anemia  - hgb 9.2 increasing with removal of volume  - not due for ESA yet - repleting Fe 5. Metabolic bone disease -  Continue VDRA P ok 6. Nutrition - renal diet/fluid restriction 7. Noncompliance with dialysis (prior history per old records) - she tells me she has called SCAT to arrange for Saturday transportation to HD; hopefully attendance will improved 8. Disp - cleared by renal perspective for d/c   Amalia Hailey, PA-C [facility] pager [phone] 11/12/2017, 11:05 AM   Pt seen, examined and agree w A/P as above.  Kelly Splinter MD [facility] pager [phone]   11/12/2017, 1:12 PM   Recent Labs  Lab 11/11/17 1019 11/11/17 2155 11/12/17 0423  NA 140 141 143  K 4.9 3.7 4.4  CL 103 100* 102  CO2 25 30 28   GLUCOSE 159* 127* 115*  BUN 53* 17 21*  CREATININE 7.72* 4.02* 4.70*  CALCIUM 8.0* 8.4* 8.3*  PHOS  --  3.9  --    Recent Labs  Lab 11/11/17 2155  ALBUMIN 3.4*   Recent Labs  Lab 11/10/17 1140 11/10/17 1159 11/11/17 1019 11/12/17 0423  WBC 3.4*  --  3.8* 3.7*  HGB 8.4* 9.2* 8.8* 9.2*  HCT 27.9* 27.0* 29.5* 30.6*  MCV 80.6  --  80.4 80.5  PLT 157  --  172 167   Iron/TIBC/Ferritin/ %Sat No results found for: IRON, TIBC, FERRITIN, IRONPCTSAT

## 2017-11-12 NOTE — Progress Notes (Signed)
Pt requesting assistance with ride home stating that he can not take a bus because she "passes out on the bus". This RN witnessed pt ambulating to bathroom independently. CSW notified.

## 2017-11-12 NOTE — Progress Notes (Signed)
Clinical Social Worker following patient for support and discharge needs. CSW was consulted to assist patient with SCAT transportation to dialysis. CSW met patient at bedside to discuss transportation needs. CSW filled out Part B of SCAT application, CSW gave application to patient and stated she will need to fill out Part A on her own and fax or e-mail application back to SCAT Eligibility Department. CSW stated to patient that application will need to be sent to back to SCAT department before patient can utilize resources. patient stated she understands and will follow up with SCAT. Patient needed assistance with transportation back home. CSW gave RN a cab voucher for RN to call when patient is ready for discharge. CSW signing off as patients needs have been meet.   Rhea Pink, MSW,  [place]

## 2017-11-12 NOTE — Progress Notes (Signed)
Pt is nauseous and had an episode of emesis with a nosebleed. PRN zofran given 15 min earlier. BP is 203/82 HR 84. Attending consulted. Will continue to monitor.  Fransico Michael, RN

## 2017-11-25 NOTE — Discharge Summary (Signed)
Physician Discharge Summary  Lauren Warner [facility]:[id number] DOB: 05/23/1966 DOA: 11/10/2017  PCP: [name], [name]  Admit date: 11/10/2017 Discharge date: 11/12/2017  Time spent: 35 minutes  Recommendations for Outpatient Follow-up:  1. PCP Dr. [name] in 2 weeks   Discharge Diagnoses:  Active Problems:   Acute respiratory failure with hypoxia (HCC)   Volume overload   Noncompliance   Essential hypertension   CAD (coronary artery disease)   ESRD (end stage renal disease) (HCC)   Nausea vomiting and diarrhea   DM (diabetes mellitus), type 2 with renal complications (HCC)   Hyperkalemia   Discharge Condition: improved  Diet recommendation: renal diabetic  Filed Weights   11/11/17 1744 11/12/17 0710 11/12/17 1052  Weight: 100 kg (220 lb 7.4 oz) 99.3 kg (218 lb 14.7 oz) 96.2 kg (212 lb 1.3 oz)    History of present illness:  52 y.o.BF PMHx  anxiety, chronic respiratory failure on 2 L O2 at home, PE, diabetes type 1, CAD S/P stents x3 CHF, ESRD on HD T/Th/Sat, for about a year who moved to [place] from [place] without a HD unit. She has been only been to 3 dialysis treatments since her May discharge from [facility] Course:   Hyperkalemia with uremia due to missed HD -Renal consulted, resolved with extra hemodialysis, importance of compliance emphasized -Social worker consulted for assistance with SCAT application  ESRd on HD -Move to [place] from [place] a few months back without making arrangements for dialysis -Poor compliance with outpatient hemodialysis due to transportation issues -Extra HD completed inpatient, -Education officer, museum consulted and application for SCAT was done  Hypertension/volume -BP improved with lowering volume, Coreg continued  Anemia of chronic disease -Stable, epo per renal  Noncompliance  History of diabetes mellitus mellitus -Not on medications for this, reportedly diet controlled      Discharge  Exam: Vitals:   11/12/17 1135 11/12/17 1200  BP: 132/72   Pulse: 79 80  Resp: 18 10  Temp:    SpO2: 98% 96%    General: AAOx3 Cardiovascular: S1S2/RRR Respiratory: CTAB  Discharge Instructions   Discharge Instructions    Discharge instructions   Complete by:  As directed    Renal Diet   Increase activity slowly   Complete by:  As directed      Allergies as of 11/12/2017      Reactions   Hydrocodone-acetaminophen Other (See Comments)   "makes me fell crazy"   Metformin And Related Other (See Comments)   "Makes me feel crazy"      Medication List    STOP taking these medications   sevelamer carbonate 800 MG tablet Commonly known as:  RENVELA   traMADol 50 MG tablet Commonly known as:  ULTRAM     TAKE these medications   amLODipine 10 MG tablet Commonly known as:  NORVASC Take 1 tablet (10 mg total) by mouth daily.   aspirin EC 81 MG tablet Take 1 tablet (81 mg total) by mouth daily.   calcium acetate 667 MG capsule Commonly known as:  PHOSLO Take 1 capsule (667 mg total) by mouth 3 (three) times daily with meals.   carvedilol 12.5 MG tablet Commonly known as:  COREG Take 1 tablet (12.5 mg total) by mouth 2 (two) times daily with a meal.   clopidogrel 75 MG tablet Commonly known as:  PLAVIX Take 1 tablet (75 mg total) by mouth daily.   escitalopram 10 MG tablet Commonly known as:  LEXAPRO Take 1  tablet (10 mg total) by mouth daily.   gabapentin 300 MG capsule Commonly known as:  NEURONTIN Take 1 capsule (300 mg total) by mouth at bedtime.   latanoprost 0.005 % ophthalmic solution Commonly known as:  XALATAN Place 1 drop into both eyes at bedtime.   multivitamin Tabs tablet Take 1 tablet by mouth at bedtime.     ASK your doctor about these medications   traMADol 50 MG tablet Commonly known as:  ULTRAM Take 0.5 tablets (25 mg total) by mouth every 12 (twelve) hours as needed for up to 5 days for moderate pain. Ask about: Should I take this  medication?      Allergies  Allergen Reactions  . Hydrocodone-Acetaminophen Other (See Comments)    "makes me fell crazy"  . Metformin And Related Other (See Comments)    "Makes me feel crazy"      The results of significant diagnostics from this hospitalization (including imaging, microbiology, ancillary and laboratory) are listed below for reference.    Significant Diagnostic Studies: Dg Chest Portable 1 View  Result Date: 11/10/2017 CLINICAL DATA:  Shortness of breath.  CHF.  Kidney disease. EXAM: PORTABLE CHEST 1 VIEW COMPARISON:  10/18/2017. FINDINGS: Cardiomegaly with diffuse pulmonary interstitial prominence. Interstitial prominence is increased slightly from prior study of 10/18/2017. These findings are consistent with CHF with progressive pulmonary interstitial edema. Pneumonitis cannot be excluded. No pneumothorax. IMPRESSION: Cardiomegaly with interstitial pulmonary edema. Interim slight progression from prior exam. Electronically Signed   By: Marcello Moores  Register   On: 11/10/2017 12:36    Microbiology: No results found for this or any previous visit (from the past 240 hour(s)).   Labs: Basic Metabolic Panel: No results for input(s): NA, K, CL, CO2, GLUCOSE, BUN, CREATININE, CALCIUM, MG, PHOS in the last 168 hours. Liver Function Tests: No results for input(s): AST, ALT, ALKPHOS, BILITOT, PROT, ALBUMIN in the last 168 hours. No results for input(s): LIPASE, AMYLASE in the last 168 hours. No results for input(s): AMMONIA in the last 168 hours. CBC: No results for input(s): WBC, NEUTROABS, HGB, HCT, MCV, PLT in the last 168 hours. Cardiac Enzymes: No results for input(s): CKTOTAL, CKMB, CKMBINDEX, TROPONINI in the last 168 hours. BNP: BNP (last 3 results) No results for input(s): BNP in the last 8760 hours.  ProBNP (last 3 results) No results for input(s): PROBNP in the last 8760 hours.  CBG: No results for input(s): GLUCAP in the last 168  hours.     Signed:  Domenic Polite MD.  [facility] 11/25/2017, 4:15 PM

## 2017-12-03 ENCOUNTER — Emergency Department (HOSPITAL_COMMUNITY): Payer: Medicaid Other

## 2017-12-03 ENCOUNTER — Inpatient Hospital Stay (HOSPITAL_COMMUNITY)
Admission: EM | Admit: 2017-12-03 | Discharge: 2017-12-04 | DRG: 640 | Disposition: A | Discharge status: Home Health | Payer: Medicaid Other | Attending: Family Medicine | Admitting: Family Medicine

## 2017-12-03 ENCOUNTER — Other Ambulatory Visit: Payer: Self-pay

## 2017-12-03 DIAGNOSIS — N2581 Secondary hyperparathyroidism of renal origin: Secondary | ICD-10-CM | POA: Diagnosis present

## 2017-12-03 DIAGNOSIS — Z9981 Dependence on supplemental oxygen: Secondary | ICD-10-CM

## 2017-12-03 DIAGNOSIS — I132 Hypertensive heart and chronic kidney disease with heart failure and with stage 5 chronic kidney disease, or end stage renal disease: Secondary | ICD-10-CM | POA: Diagnosis present

## 2017-12-03 DIAGNOSIS — J45909 Unspecified asthma, uncomplicated: Secondary | ICD-10-CM | POA: Diagnosis present

## 2017-12-03 DIAGNOSIS — F329 Major depressive disorder, single episode, unspecified: Secondary | ICD-10-CM | POA: Diagnosis present

## 2017-12-03 DIAGNOSIS — Z86711 Personal history of pulmonary embolism: Secondary | ICD-10-CM

## 2017-12-03 DIAGNOSIS — R55 Syncope and collapse: Secondary | ICD-10-CM | POA: Diagnosis present

## 2017-12-03 DIAGNOSIS — E875 Hyperkalemia: Secondary | ICD-10-CM | POA: Diagnosis present

## 2017-12-03 DIAGNOSIS — Z7982 Long term (current) use of aspirin: Secondary | ICD-10-CM

## 2017-12-03 DIAGNOSIS — Z885 Allergy status to narcotic agent status: Secondary | ICD-10-CM

## 2017-12-03 DIAGNOSIS — I509 Heart failure, unspecified: Secondary | ICD-10-CM | POA: Diagnosis present

## 2017-12-03 DIAGNOSIS — E1022 Type 1 diabetes mellitus with diabetic chronic kidney disease: Secondary | ICD-10-CM | POA: Diagnosis present

## 2017-12-03 DIAGNOSIS — F419 Anxiety disorder, unspecified: Secondary | ICD-10-CM | POA: Diagnosis present

## 2017-12-03 DIAGNOSIS — I2510 Atherosclerotic heart disease of native coronary artery without angina pectoris: Secondary | ICD-10-CM | POA: Diagnosis present

## 2017-12-03 DIAGNOSIS — R197 Diarrhea, unspecified: Secondary | ICD-10-CM | POA: Diagnosis present

## 2017-12-03 DIAGNOSIS — N186 End stage renal disease: Secondary | ICD-10-CM | POA: Diagnosis present

## 2017-12-03 DIAGNOSIS — Z888 Allergy status to other drugs, medicaments and biological substances status: Secondary | ICD-10-CM

## 2017-12-03 DIAGNOSIS — R112 Nausea with vomiting, unspecified: Secondary | ICD-10-CM

## 2017-12-03 DIAGNOSIS — I10 Essential (primary) hypertension: Secondary | ICD-10-CM | POA: Diagnosis present

## 2017-12-03 DIAGNOSIS — Z992 Dependence on renal dialysis: Secondary | ICD-10-CM

## 2017-12-03 DIAGNOSIS — Z955 Presence of coronary angioplasty implant and graft: Secondary | ICD-10-CM

## 2017-12-03 DIAGNOSIS — M898X9 Other specified disorders of bone, unspecified site: Secondary | ICD-10-CM | POA: Diagnosis present

## 2017-12-03 DIAGNOSIS — E7800 Pure hypercholesterolemia, unspecified: Secondary | ICD-10-CM | POA: Diagnosis present

## 2017-12-03 DIAGNOSIS — Z7902 Long term (current) use of antithrombotics/antiplatelets: Secondary | ICD-10-CM

## 2017-12-03 DIAGNOSIS — Z823 Family history of stroke: Secondary | ICD-10-CM

## 2017-12-03 DIAGNOSIS — D631 Anemia in chronic kidney disease: Secondary | ICD-10-CM | POA: Diagnosis present

## 2017-12-03 DIAGNOSIS — E1129 Type 2 diabetes mellitus with other diabetic kidney complication: Secondary | ICD-10-CM | POA: Diagnosis present

## 2017-12-03 LAB — LIPASE, BLOOD: Lipase: 90 U/L — ABNORMAL HIGH (ref 11–51)

## 2017-12-03 LAB — COMPREHENSIVE METABOLIC PANEL
ALBUMIN: 3.7 g/dL (ref 3.5–5.0)
ALT: 16 U/L (ref 0–44)
AST: 17 U/L (ref 15–41)
Alkaline Phosphatase: 101 U/L (ref 38–126)
Anion gap: 14 (ref 5–15)
BILIRUBIN TOTAL: 0.5 mg/dL (ref 0.3–1.2)
BUN: 77 mg/dL — AB (ref 6–20)
CHLORIDE: 98 mmol/L (ref 98–111)
CO2: 25 mmol/L (ref 22–32)
Calcium: 8.6 mg/dL — ABNORMAL LOW (ref 8.9–10.3)
Creatinine, Ser: 9.63 mg/dL — ABNORMAL HIGH (ref 0.44–1.00)
GFR calc Af Amer: 5 mL/min — ABNORMAL LOW (ref >60)
GFR calc non Af Amer: 4 mL/min — ABNORMAL LOW (ref >60)
GLUCOSE: 187 mg/dL — AB (ref 70–99)
Potassium: 6 mmol/L — ABNORMAL HIGH (ref 3.5–5.1)
Sodium: 137 mmol/L (ref 135–145)
Total Protein: 7.6 g/dL (ref 6.5–8.1)

## 2017-12-03 LAB — CBC WITH DIFFERENTIAL/PLATELET
Abs Immature Granulocytes: 0 10*3/uL (ref 0.0–0.1)
Basophils Absolute: 0 10*3/uL (ref 0.0–0.1)
Basophils Relative: 1 %
EOS ABS: 0.2 10*3/uL (ref 0.0–0.7)
Eosinophils Relative: 4 %
HEMATOCRIT: 33.8 % — AB (ref 36.0–46.0)
Hemoglobin: 10.2 g/dL — ABNORMAL LOW (ref 12.0–15.0)
IMMATURE GRANULOCYTES: 1 %
LYMPHS ABS: 1.3 10*3/uL (ref 0.7–4.0)
LYMPHS PCT: 27 %
MCH: 23.8 pg — ABNORMAL LOW (ref 26.0–34.0)
MCHC: 30.2 g/dL (ref 30.0–36.0)
MCV: 79 fL (ref 78.0–100.0)
Monocytes Absolute: 0.4 10*3/uL (ref 0.1–1.0)
Monocytes Relative: 8 %
NEUTROS ABS: 2.8 10*3/uL (ref 1.7–7.7)
NEUTROS PCT: 59 %
PLATELETS: 216 10*3/uL (ref 150–400)
RBC: 4.28 MIL/uL (ref 3.87–5.11)
RDW: 16.9 % — ABNORMAL HIGH (ref 11.5–15.5)
WBC: 4.7 10*3/uL (ref 4.0–10.5)

## 2017-12-03 LAB — MRSA PCR SCREENING: MRSA BY PCR: NEGATIVE

## 2017-12-03 LAB — GLUCOSE, CAPILLARY: Glucose-Capillary: 150 mg/dL — ABNORMAL HIGH (ref 70–99)

## 2017-12-03 LAB — I-STAT TROPONIN, ED: Troponin i, poc: 0 ng/mL (ref 0.00–0.08)

## 2017-12-03 MED ORDER — ASPIRIN EC 81 MG PO TBEC
81.0000 mg | DELAYED_RELEASE_TABLET | Freq: Every day | ORAL | Status: DC
  Administered 2017-12-04: 81 mg via ORAL
  Filled 2017-12-03: qty 1

## 2017-12-03 MED ORDER — ONDANSETRON HCL 4 MG/2ML IJ SOLN
4.0000 mg | Freq: Once | INTRAMUSCULAR | Status: AC
  Administered 2017-12-03: 4 mg via INTRAVENOUS
  Filled 2017-12-03: qty 2

## 2017-12-03 MED ORDER — HEPARIN SODIUM (PORCINE) 5000 UNIT/ML IJ SOLN
5000.0000 [IU] | Freq: Three times a day (TID) | INTRAMUSCULAR | Status: DC
  Administered 2017-12-03 – 2017-12-04 (×2): 5000 [IU] via SUBCUTANEOUS
  Filled 2017-12-03 (×2): qty 1

## 2017-12-03 MED ORDER — ONDANSETRON HCL 4 MG/2ML IJ SOLN: 4.0000 mg | Freq: Four times a day (QID) | INTRAMUSCULAR | Status: DC | PRN

## 2017-12-03 MED ORDER — ALBUTEROL SULFATE HFA 108 (90 BASE) MCG/ACT IN AERS: 2.0000 | INHALATION_SPRAY | Freq: Four times a day (QID) | RESPIRATORY_TRACT | Status: DC | PRN

## 2017-12-03 MED ORDER — SODIUM POLYSTYRENE SULFONATE 15 GM/60ML PO SUSP
30.0000 g | Freq: Once | ORAL | Status: AC
  Administered 2017-12-03: 30 g via ORAL
  Filled 2017-12-03: qty 120

## 2017-12-03 MED ORDER — CALCIUM ACETATE (PHOS BINDER) 667 MG PO CAPS
667.0000 mg | ORAL_CAPSULE | Freq: Three times a day (TID) | ORAL | Status: DC
  Administered 2017-12-04 (×2): 667 mg via ORAL
  Filled 2017-12-03 (×3): qty 1

## 2017-12-03 MED ORDER — LATANOPROST 0.005 % OP SOLN
1.0000 [drp] | Freq: Every day | OPHTHALMIC | Status: DC
  Administered 2017-12-03: 1 [drp] via OPHTHALMIC
  Filled 2017-12-03: qty 2.5

## 2017-12-03 MED ORDER — SODIUM CHLORIDE 0.9 % IV SOLN: INTRAVENOUS | Status: DC

## 2017-12-03 MED ORDER — GABAPENTIN 300 MG PO CAPS
300.0000 mg | ORAL_CAPSULE | Freq: Every day | ORAL | Status: DC
  Administered 2017-12-03: 300 mg via ORAL
  Filled 2017-12-03: qty 1

## 2017-12-03 MED ORDER — ONDANSETRON HCL 4 MG PO TABS: 4.0000 mg | ORAL_TABLET | Freq: Four times a day (QID) | ORAL | Status: DC | PRN

## 2017-12-03 MED ORDER — CARVEDILOL 6.25 MG PO TABS
6.2500 mg | ORAL_TABLET | Freq: Two times a day (BID) | ORAL | Status: DC
  Administered 2017-12-04: 6.25 mg via ORAL
  Filled 2017-12-03 (×2): qty 1

## 2017-12-03 MED ORDER — SODIUM CHLORIDE 0.9 % IV BOLUS
500.0000 mL | Freq: Once | INTRAVENOUS | Status: AC
  Administered 2017-12-03: 500 mL via INTRAVENOUS

## 2017-12-03 MED ORDER — RENA-VITE PO TABS
1.0000 | ORAL_TABLET | Freq: Every day | ORAL | Status: DC
  Administered 2017-12-03: 1 via ORAL
  Filled 2017-12-03: qty 1

## 2017-12-03 MED ORDER — CLOPIDOGREL BISULFATE 75 MG PO TABS
75.0000 mg | ORAL_TABLET | Freq: Every day | ORAL | Status: DC
Start: 2017-12-03 — End: 2017-12-04
  Administered 2017-12-04: 75 mg via ORAL
  Filled 2017-12-03: qty 1

## 2017-12-03 MED ORDER — ALBUTEROL SULFATE (2.5 MG/3ML) 0.083% IN NEBU: 2.5000 mg | INHALATION_SOLUTION | RESPIRATORY_TRACT | Status: DC | PRN

## 2017-12-03 MED ORDER — ESCITALOPRAM OXALATE 10 MG PO TABS
10.0000 mg | ORAL_TABLET | Freq: Every day | ORAL | Status: DC
  Administered 2017-12-04: 10 mg via ORAL
  Filled 2017-12-03: qty 1

## 2017-12-03 NOTE — ED Notes (Signed)
Pt could not stand to complete orthostatic vitals due to dizziness and nausea.

## 2017-12-03 NOTE — ED Notes (Signed)
Pt still unable to provide a urine sample at this time  °

## 2017-12-03 NOTE — ED Notes (Signed)
Attempted to obtain a urine sample; pt still not able to provide urine sample at this time

## 2017-12-03 NOTE — ED Triage Notes (Signed)
Pt brought in By EMS for C/o syncopal episode at the Dialysis center today; pt did not start dialysis ; pt c/o N/V'/lower abd pain and dysuria  X  2 days along with slight headache ; pt received 4 mg of Zofran Im prior to arrival

## 2017-12-03 NOTE — ED Notes (Signed)
Pt denies any further N/V or abd pain at this time

## 2017-12-03 NOTE — H&P (Signed)
HISTORY AND PHYSICAL       PATIENT DETAILS Name: Lauren Warner Age: 52 y.o. Sex: female Date of Birth: 12-14-1965 Admit Date: 12/03/2017 TOI:[id number], Brennan Bailey, [name]   Patient coming from: Home   CHIEF COMPLAINT:  Syncope  HPI: Lauren Warner is a 52 y.o. female with medical history significant of ESRD on HD (TTS), CAD status post PCI several years back on aspirin and Plavix, hypertension, remote history of pulmonary embolism-presents to the ED directly from her hemodialysis center for evaluation of a syncopal episode.  Per patient for the past 3 days or so she has had numerous episodes of nausea, vomiting and diarrhea.  Per patient-she has had approximately 10-15 loose stools in a 24.  For the past 3 days.  Stools are watery-did not contain blood or mucus.  She has mild lower abdominal pain.  She has also had 3-4 episodes of vomiting on a daily basis.  She has been feeling dizzy for the past few days, her last dialysis was on Tuesday-she notes feeling dizzy that day as well.  Dizziness is mostly when she stands up after sitting/lying down for a long period of time.  Her last diarrhea and vomiting was around 6 AM this morning-since then she has had no episodes.  She went to hemodialysis this morning-she was dizzy when moving around-she then sustained a syncopal episode when she stood up from a chair while she was waiting at the dialysis center.  She noted that when she stood up-she felt very dizzy and lightheaded and subsequently passed out.  She denies any tongue bite or urinary incontinence.  She was subsequently transported to the emergency room for further evaluation, where he was asked to admit this patient.  ED Course:  Cerumen-patient was given 1 L of fluid.  The potassium was found to be elevated-after discussing case with nephrology, ED PA-C gave 30 g of Kayexalate.  Per ED PA-C nephrology to see this patient tomorrow for possible HD.  Note: Lives at: Home Mobility:  Independent Chronic Indwelling Foley:no   REVIEW OF SYSTEMS:  Constitutional:   No  weight loss, night sweats,  Fevers, chills, fatigue.  HEENT:    No headaches, Dysphagia,Tooth/dental problems,Sore throat,  No sneezing, itching, ear ache, nasal congestion, post nasal drip  Cardio-vascular: No chest pain,Orthopnea, PND,lower extremity edema, anasarca, palpitations  GI:  No heartburn, indigestion,  melena or hematochezia  Resp: No shortness of breath, cough, hemoptysis,plueritic chest pain.   Skin:  No rash or lesions.  GU:  No dysuria, change in color of urine, no urgency or frequency.  No flank pain.  Musculoskeletal: No joint pain or swelling.  No decreased range of motion.  No back pain.  Endocrine: No heat intolerance, no cold intolerance, no polyuria, no polydipsia  Psych: No change in mood or affect. No depression or anxiety.  No memory loss.   ALLERGIES:   Allergies  Allergen Reactions  . Adhesive [Tape] Other (See Comments)    "RIPS MY SKIN," SO PLEASE USE COBAN WRAP!!  . Hydrocodone-Acetaminophen Other (See Comments)    "makes me fell crazy"  . Metformin And Related Other (See Comments)    "Makes me feel crazy"    PAST MEDICAL HISTORY: Past Medical History:  Diagnosis Date  . Anemia   . Anxiety   . Asthma   . CHF (congestive heart failure) ([place])   . Coronary artery disease   . Daily headache   . Depression   .  ESRD (end stage renal disease) on dialysis ([place])    "[facility]; TTS" (10/19/2017)  . High cholesterol   . History of blood transfusion 10/19/2017   "anemia"  . Hyperkalemia 10/2017  . Hypertension   . On home oxygen therapy    "2L; daily" (10/19/2017)  . Pneumonia    "several times" (10/19/2017)  . Pulmonary embolism ([place]) 2012  . Type 1 diabetes mellitus ([place])     PAST SURGICAL HISTORY: Past Surgical History:  Procedure Laterality Date  . AV FISTULA PLACEMENT Left 2018  . CESAREAN SECTION  1989  . CORONARY ANGIOPLASTY WITH  STENT PLACEMENT  ~ 08/2017   "3 stents" (10/19/2017)  . ENDOMETRIAL ABLATION  ~ 2011    MEDICATIONS AT HOME: Prior to Admission medications   Medication Sig Start Date End Date Taking? Authorizing Provider  albuterol (PROAIR HFA) 108 (90 Base) MCG/ACT inhaler Inhale 2 puffs into the lungs every 6 (six) hours as needed for wheezing or shortness of breath.   Yes [provider]  amLODipine (NORVASC) 10 MG tablet Take 1 tablet (10 mg total) by mouth daily. Patient taking differently: Take 10 mg by mouth at bedtime.  11/12/17  Yes Domenic Polite, MD  aspirin EC 81 MG tablet Take 1 tablet (81 mg total) by mouth daily. Patient taking differently: Take 81 mg by mouth at bedtime.  11/12/17  Yes Domenic Polite, MD  calcium acetate (PHOSLO) 667 MG capsule Take 1 capsule (667 mg total) by mouth 3 (three) times daily with meals. 11/12/17   Domenic Polite, MD  carvedilol (COREG) 12.5 MG tablet Take 1 tablet (12.5 mg total) by mouth 2 (two) times daily with a meal. 11/12/17   Domenic Polite, MD  clopidogrel (PLAVIX) 75 MG tablet Take 1 tablet (75 mg total) by mouth daily. 11/12/17   Domenic Polite, MD  escitalopram (LEXAPRO) 10 MG tablet Take 1 tablet (10 mg total) by mouth daily. 11/12/17   Domenic Polite, MD  gabapentin (NEURONTIN) 300 MG capsule Take 1 capsule (300 mg total) by mouth at bedtime. 11/12/17   Domenic Polite, MD  latanoprost (XALATAN) 0.005 % ophthalmic solution Place 1 drop into both eyes at bedtime.    [provider]  multivitamin (RENA-VIT) TABS tablet Take 1 tablet by mouth at bedtime. 10/21/17   Geradine Girt, DO    FAMILY HISTORY: Family History  Problem Relation Age of Onset  . Stroke Sister      SOCIAL HISTORY:  reports that she has never smoked. She has never used smokeless tobacco. She reports that she does not drink alcohol or use drugs.  PHYSICAL EXAM: Blood pressure (!) 162/88, pulse 89, temperature 98.2 F (36.8 C), temperature source Oral, resp.  rate 18, height 5\' 7"  (1.702 m), weight 85.7 kg (189 lb), last menstrual period 10/18/2009, SpO2 100 %.  General appearance :Awake, alert, not in any distress. Speech Clear. Not toxic Looking Eyes:, pupils equally reactive to light and accomodation,no scleral icterus.Pink conjunctiva HEENT: Atraumatic and Normocephalic Neck: supple, no JVD. No cervical lymphadenopathy. No thyromegaly Resp:Good air entry bilaterally, no added sounds  CVS: S1 S2 regular, no murmurs.  GI: Bowel sounds present, Non tender and not distended with no gaurding, rigidity or rebound.No organomegaly Extremities: B/L Lower Ext shows no edema, both legs are warm to touch Neurology:  speech clear,Non focal, sensation is grossly intact. Psychiatric: Normal judgment and insight. Alert and oriented x 3. Normal mood. Musculoskeletal:gait appears to be normal.No digital cyanosis Skin:No Rash, warm and dry Wounds:N/A  LABS ON ADMISSION:  I have personally reviewed following labs and imaging studies  CBC: Recent Labs  Lab 12/03/17 1440  WBC 4.7  NEUTROABS 2.8  HGB 10.2*  HCT 33.8*  MCV 79.0  PLT 638    Basic Metabolic Panel: Recent Labs  Lab 12/03/17 1440  NA 137  K 6.0*  CL 98  CO2 25  GLUCOSE 187*  BUN 77*  CREATININE 9.63*  CALCIUM 8.6*    GFR: Estimated Creatinine Clearance: 7.9 mL/min (A) (by C-G formula based on SCr of 9.63 mg/dL (H)).  Liver Function Tests: Recent Labs  Lab 12/03/17 1440  AST 17  ALT 16  ALKPHOS 101  BILITOT 0.5  PROT 7.6  ALBUMIN 3.7   Recent Labs  Lab 12/03/17 1440  LIPASE 90*   No results for input(s): AMMONIA in the last 168 hours.  Coagulation Profile: No results for input(s): INR, PROTIME in the last 168 hours.  Cardiac Enzymes: No results for input(s): CKTOTAL, CKMB, CKMBINDEX, TROPONINI in the last 168 hours.  BNP (last 3 results) No results for input(s): PROBNP in the last 8760 hours.  HbA1C: No results for input(s): HGBA1C in the last 72  hours.  CBG: No results for input(s): GLUCAP in the last 168 hours.  Lipid Profile: No results for input(s): CHOL, HDL, LDLCALC, TRIG, CHOLHDL, LDLDIRECT in the last 72 hours.  Thyroid Function Tests: No results for input(s): TSH, T4TOTAL, FREET4, T3FREE, THYROIDAB in the last 72 hours.  Anemia Panel: No results for input(s): VITAMINB12, FOLATE, FERRITIN, TIBC, IRON, RETICCTPCT in the last 72 hours.  Urine analysis:    Component Value Date/Time   COLORURINE YELLOW 10/18/2017 2036   APPEARANCEUR CLEAR 10/18/2017 2036   LABSPEC 1.014 10/18/2017 2036   PHURINE 6.0 10/18/2017 2036   GLUCOSEU >=500 (A) 10/18/2017 2036   HGBUR SMALL (A) 10/18/2017 2036   BILIRUBINUR NEGATIVE 10/18/2017 2036   KETONESUR NEGATIVE 10/18/2017 2036   PROTEINUR >=300 (A) 10/18/2017 2036   NITRITE NEGATIVE 10/18/2017 2036   LEUKOCYTESUR NEGATIVE 10/18/2017 2036    Sepsis Labs: Lactic Acid, Venous No results found for: [place]   Microbiology: No results found for this or any previous visit (from the past 240 hour(s)).    RADIOLOGIC STUDIES ON ADMISSION: Dg Chest 2 View  Result Date: 12/03/2017 CLINICAL DATA:  Syncope. EXAM: CHEST - 2 VIEW COMPARISON:  11/10/2017 FINDINGS: 1351 hours. Stable asymmetric elevation right hemidiaphragm. Cardiopericardial silhouette is at upper limits of normal for size. There is pulmonary vascular congestion without overt pulmonary edema. The visualized bony structures of the thorax are intact. Telemetry leads overlie the chest. IMPRESSION: Borderline cardiomegaly with vascular congestion. Electronically Signed   By: Misty Stanley M.D.   On: 12/03/2017 14:01    I have personally reviewed images of chest xray   EKG: Personally reviewed.  Normal sinus rhythm  ASSESSMENT AND PLAN: Syncope: High suspicion for volume depletion causing orthostatic changes.  Monitor on telemetry overnight-recent echocardiogram in May showed preserved EF-no need to repeat echo at this  point.  EKG without acute changes.  She does have a history of CAD and is status post PCI several years back.  Check orthostatic vital signs later this evening and tomorrow.  She has very received 1 L of normal saline in the emergency room-volume status is stable-we will decide on further volume replenishment with IV fluids following checking orthostatic vital signs.  Hyperkalemia: Given 30 g of Kayexalate in the emergency room, recheck electrolytes later tomorrow.  Spoke with Dr. Benay Pike  to formally evaluate tomorrow for possible HD.  No EKG changes as noted above.  Nausea, vomiting and diarrhea: Ongoing for the past 3 days but her last vomiting and diarrheal episode was around 6 AM this morning.  She is not sure if she has had any antimicrobial therapy in the past 3 months-but since diarrhea and vomiting seems to have essentially resolved-I have not ordered any stool studies at this point.  If her diarrhea reoccurs-we will send out a stool GI pathogen panel.  Start clear liquids and advance as tolerated.  ESRD: On HD TTS-nephrology to see tomorrow-see above.  This MD discussed with Dr. Florene Glen  Hypertension: Hold amlodipine-decrease Coreg to half of her usual dosing to start from tomorrow.  Check orthostatic vital signs-may need to adjust antihypertensives further.  CAD: No anginal symptoms-initial troponin/EKG without any acute changes.  Continue antiplatelet agents.  Further plan will depend as patient's clinical course evolves and further radiologic and laboratory data become available. Patient will be monitored closely.  Above noted plan was discussed with patient  face to face at bedside, she was in agreement.   CONSULTS: None  DVT Prophylaxis: Prophylactic Heparin  Code Status: Full Code  Disposition Plan:  Discharge back home possibly in 1 days, depending on clinical course  Admission status: Observation going to tele  Total time spent  55 minutes.Greater than 50% of  this time was spent in counseling, explanation of diagnosis, planning of further management, and coordination of care.  Oren Binet [facility] Pager [phone]  If 7PM-7AM, please contact night-coverage [email or web address] Password [email or web address] 12/03/2017, 5:29 PM

## 2017-12-03 NOTE — Progress Notes (Signed)
Present to do PIV.  Pt not in room - gone to xray.  ED nurse to place consult when pt. Back in room.

## 2017-12-03 NOTE — Progress Notes (Signed)
New Admission Note:   Arrival Method: Stretcher Mental Orientation: Alert and Oriented x 4 Telemetry: 68M04. 2 RNs verified.  Assessment: Completed Skin: Assessed by 2 RNs, noted L heel cracking IV:  Right antecubital NSL Pain: headache 9/10, abdominal tenderness Tubes: n/a Safety Measures: high fall risk, yellow arm band, bed alarm Admission: completed 69M Orientation: done Family: not at bediside Personal Belongings: clothing, cell phone  Orders have been reviewed and implemented. Will continue to monitor the patient. Call light has been placed within reach and bed alarm has been activated.   Manya Silvas, RN MSN [place] Phone number: [phone]

## 2017-12-03 NOTE — ED Provider Notes (Signed)
[facility] EMERGENCY DEPARTMENT Provider Note   CSN: [id number] Arrival date & time: 12/03/17  1309     History   Chief Complaint Chief Complaint  Patient presents with  . Loss of Consciousness    HPI Lauren Warner is a 52 y.o. female.  Lauren Warner is a 52 y.o. Female with a history of ESRD on hemodialysis (TThS), CHF, hypertension, diabetes, PE, CAD, asthma and anxiety, who presents to the emergency department from dialysis via EMS after a syncopal episode.  Patient reports she was walking into the treatment area to get weighed when she started to feel very lightheaded and then had a syncopal episode, patient was not able to start her dialysis treatment was sent here for further evaluation.  Patient reports Tuesday evening after dialysis she started to develop nausea vomiting and diarrhea, with some intermittent lower abdominal pain and dysuria, patient does still make small amount of urine and reports discomfort with urination over the past 2 days.  She reports emesis has been nonbloody but she has had several episodes and feels very nauseated has not been able to keep down much other than a little bit of water and kiwi.  She reports several episodes of loose stools, nonbloody, no melena.  She reports subjective fevers and chills, has not taken her temperature, afebrile here.  Reports some intermittent chest discomfort especially after vomiting and some mild shortness of breath.  Patient intermittently wears 2 L nasal cannula at home for comfort.  She reports before today she had not had any syncopal episodes but has been feeling very lightheaded and weak, had an episode of diarrhea where she could not get herself up off the couch and make it to the bathroom in time at home.  Patient also expresses concern that she is killed several spiders in her home over the past 2 days and is concerned this may all be caused by a spider bite she reports small bite to the nose and left  side of the neck with some mild surrounding swelling, denies purulent drainage from these areas.     Past Medical History:  Diagnosis Date  . Anemia   . Anxiety   . Asthma   . CHF (congestive heart failure) ([place])   . Coronary artery disease   . Daily headache   . Depression   . ESRD (end stage renal disease) on dialysis ([place])    "[facility]; TTS" (10/19/2017)  . High cholesterol   . History of blood transfusion 10/19/2017   "anemia"  . Hyperkalemia 10/2017  . Hypertension   . On home oxygen therapy    "2L; daily" (10/19/2017)  . Pneumonia    "several times" (10/19/2017)  . Pulmonary embolism ([place]) 2012  . Type 1 diabetes mellitus [facility])     Patient Active Problem List   Diagnosis Date Noted  . Hyperkalemia 11/10/2017  . Pressure injury of skin 10/20/2017  . Essential hypertension 10/19/2017  . CAD (coronary artery disease) 10/19/2017  . ESRD (end stage renal disease) ([place]) 10/19/2017  . Syncope 10/19/2017  . Anemia due to end stage renal disease ([place]) 10/19/2017  . Closed left ankle fracture 10/19/2017  . Nausea vomiting and diarrhea 10/19/2017  . DM (diabetes mellitus), type 2 with renal complications ([place]) 74/94/4967  . Acute respiratory failure with hypoxia ([place]) 10/18/2017    Past Surgical History:  Procedure Laterality Date  . AV FISTULA PLACEMENT Left 2018  . CESAREAN SECTION  1989  . CORONARY ANGIOPLASTY WITH STENT PLACEMENT  ~  08/2017   "3 stents" (10/19/2017)  . ENDOMETRIAL ABLATION  ~ 2011     OB History   None      Home Medications    Prior to Admission medications   Medication Sig Start Date End Date Taking? Authorizing Provider  amLODipine (NORVASC) 10 MG tablet Take 1 tablet (10 mg total) by mouth daily. 11/12/17   Domenic Polite, MD  aspirin EC 81 MG tablet Take 1 tablet (81 mg total) by mouth daily. 11/12/17   Domenic Polite, MD  calcium acetate (PHOSLO) 667 MG capsule Take 1 capsule (667 mg total) by mouth 3 (three) times daily with meals.  11/12/17   Domenic Polite, MD  carvedilol (COREG) 12.5 MG tablet Take 1 tablet (12.5 mg total) by mouth 2 (two) times daily with a meal. 11/12/17   Domenic Polite, MD  clopidogrel (PLAVIX) 75 MG tablet Take 1 tablet (75 mg total) by mouth daily. 11/12/17   Domenic Polite, MD  escitalopram (LEXAPRO) 10 MG tablet Take 1 tablet (10 mg total) by mouth daily. 11/12/17   Domenic Polite, MD  gabapentin (NEURONTIN) 300 MG capsule Take 1 capsule (300 mg total) by mouth at bedtime. 11/12/17   Domenic Polite, MD  latanoprost (XALATAN) 0.005 % ophthalmic solution Place 1 drop into both eyes at bedtime.    [provider]  multivitamin (RENA-VIT) TABS tablet Take 1 tablet by mouth at bedtime. 10/21/17   Geradine Girt, DO    Family History Family History  Problem Relation Age of Onset  . Stroke Sister     Social History Social History   Tobacco Use  . Smoking status: Never Smoker  . Smokeless tobacco: Never Used  Substance Use Topics  . Alcohol use: Never    Frequency: Never  . Drug use: Never     Allergies   Hydrocodone-acetaminophen and Metformin and related   Review of Systems Review of Systems  Constitutional: Positive for chills and fever.  HENT: Negative.   Eyes: Negative for visual disturbance.  Respiratory: Positive for shortness of breath. Negative for cough, chest tightness and wheezing.   Cardiovascular: Positive for chest pain. Negative for palpitations and leg swelling.  Gastrointestinal: Positive for abdominal pain, diarrhea, nausea and vomiting. Negative for blood in stool and constipation.  Genitourinary: Positive for dysuria. Negative for flank pain and frequency.  Musculoskeletal: Negative for arthralgias and myalgias.  Skin: Negative for color change and rash.  Neurological: Negative for dizziness.     Physical Exam Updated Vital Signs BP (!) 155/84   Pulse 88   Temp 98.2 F (36.8 C) (Oral)   Resp 16   Ht 5\' 7"  (1.702 m)   Wt 85.7 kg (189 lb)    LMP 10/18/2009 (Within Months) Comment: ablation  SpO2 98%   BMI 29.60 kg/m   Physical Exam  Constitutional: She appears well-developed and well-nourished. No distress.  Patient appears chronically ill, somewhat anxious but in no acute distress  HENT:  Head: Normocephalic and atraumatic.  Mucous membranes slightly dry  Eyes: Right eye exhibits no discharge. Left eye exhibits no discharge.  Neck: Neck supple.  Cardiovascular: Normal rate, regular rhythm, normal heart sounds and intact distal pulses.  Pulmonary/Chest: Effort normal and breath sounds normal. No respiratory distress.  Respirations equal and unlabored, patient able to speak in full sentences, lungs sounds slightly diminished bilaterally but clear to auscultation throughout  Abdominal: Soft. Bowel sounds are normal. She exhibits no distension and no mass. There is tenderness. There is no guarding.  Abdomen soft, nondistended, bowel sounds present throughout, there is some mild tenderness in the epigastric and periumbilical regions, otherwise nontender to palpation no focal guarding or rebound tenderness  Musculoskeletal: She exhibits no edema or deformity.  Neurological: She is alert. Coordination normal.  Skin: Skin is warm and dry. She is not diaphoretic.  Psychiatric: She has a normal mood and affect. Her behavior is normal.  Nursing note and vitals reviewed.    ED Treatments / Results  Labs (all labs ordered are listed, but only abnormal results are displayed) Labs Reviewed  CBC WITH DIFFERENTIAL/PLATELET - Abnormal; Notable for the following components:      Result Value   Hemoglobin 10.2 (*)    HCT 33.8 (*)    MCH 23.8 (*)    RDW 16.9 (*)    All other components within normal limits  COMPREHENSIVE METABOLIC PANEL - Abnormal; Notable for the following components:   Potassium 6.0 (*)    Glucose, Bld 187 (*)    BUN 77 (*)    Creatinine, Ser 9.63 (*)    Calcium 8.6 (*)    GFR calc non Af Amer 4 (*)    GFR calc  Af Amer 5 (*)    All other components within normal limits  LIPASE, BLOOD - Abnormal; Notable for the following components:   Lipase 90 (*)    All other components within normal limits  URINE CULTURE  CBC WITH DIFFERENTIAL/PLATELET  URINALYSIS, ROUTINE W REFLEX MICROSCOPIC  I-STAT TROPONIN, ED    EKG EKG Interpretation  Date/Time:  Thursday December 03 2017 13:17:36 EDT Ventricular Rate:  93 PR Interval:    QRS Duration: 96 QT Interval:  371 QTC Calculation: 462 R Axis:   43 Text Interpretation:  Sinus rhythm Borderline low voltage, extremity leads similar pattern to prior 6/19 Confirmed by Aletta Edouard [phone]) on 12/03/2017 1:20:19 PM   Radiology Dg Chest 2 View  Result Date: 12/03/2017 CLINICAL DATA:  Syncope. EXAM: CHEST - 2 VIEW COMPARISON:  11/10/2017 FINDINGS: 1351 hours. Stable asymmetric elevation right hemidiaphragm. Cardiopericardial silhouette is at upper limits of normal for size. There is pulmonary vascular congestion without overt pulmonary edema. The visualized bony structures of the thorax are intact. Telemetry leads overlie the chest. IMPRESSION: Borderline cardiomegaly with vascular congestion. Electronically Signed   By: Misty Stanley M.D.   On: 12/03/2017 14:01    Procedures Procedures (including critical care time)  Medications Ordered in ED Medications  sodium polystyrene (KAYEXALATE) 15 GM/60ML suspension 30 g (has no administration in time range)  sodium chloride 0.9 % bolus 500 mL (0 mLs Intravenous Stopped 12/03/17 1603)  ondansetron (ZOFRAN) injection 4 mg (4 mg Intravenous Given 12/03/17 1452)     Initial Impression / Assessment and Plan / ED Course  I have reviewed the triage vital signs and the nursing notes.  Pertinent labs & imaging results that were available during my care of the patient were reviewed by me and considered in my medical decision making (see chart for details).  Patient presents for evaluation of syncopal episode at dialysis,  unable to initiate dialysis today, 2 to 3 days of nausea vomiting and diarrhea but nonbloody.  Subjective fevers reported patient afebrile, hypertensive with otherwise normal vitals and in no acute distress.  Patient appears somewhat dry on exam, abdominal exam is benign, lungs clear but slightly diminished.  Will get abdominal labs, troponin, EKG and chest x-ray.  Will give Zofran and 500 mL fluid bolus.  EKG unremarkable, no ischemic changes or  signs of hyperkalemia.  No leukocytosis, hemoglobin at baseline.  Potassium of 6.0, no other acute electrolyte derangements creatinine of 9.63, BUN 77, LFTs normal, lipase slightly elevated at 90 likely in the setting of vomiting and diarrhea.  Normal throat.  EKG shows cardiomegaly and mild vascular congestion with no signs of pulmonary edema.  Urinalysis pending.  3:48 PM discussed with Dr. Florene Glen with nephrology, agrees the patient will likely require admission, regarding hyperkalemia recommends 30 g Kayexalate does not feel that she needs calcium gluconate or more aggressive treatment at this time, reviewed labs and chest x-ray and agrees that it looks better than usual and she does not need emergent dialysis currently.  Consult [facility] hospitalist for admission as patient is a bounce back from 6/13.  Spoke with Dr. Sloan Leiter who will see and admit the patient.  Final Clinical Impressions(s) / ED Diagnoses   Final diagnoses:  Syncope and collapse  Hyperkalemia  Nausea vomiting and diarrhea  ESRD on hemodialysis [facility])    ED Discharge Orders    None       Jacqlyn Larsen, [place] 12/03/17 1651    Hayden Rasmussen, MD 12/04/17 [phone]

## 2017-12-04 DIAGNOSIS — Z888 Allergy status to other drugs, medicaments and biological substances status: Secondary | ICD-10-CM | POA: Diagnosis not present

## 2017-12-04 DIAGNOSIS — Z885 Allergy status to narcotic agent status: Secondary | ICD-10-CM | POA: Diagnosis not present

## 2017-12-04 DIAGNOSIS — I509 Heart failure, unspecified: Secondary | ICD-10-CM | POA: Diagnosis present

## 2017-12-04 DIAGNOSIS — Z9981 Dependence on supplemental oxygen: Secondary | ICD-10-CM | POA: Diagnosis not present

## 2017-12-04 DIAGNOSIS — Z955 Presence of coronary angioplasty implant and graft: Secondary | ICD-10-CM | POA: Diagnosis not present

## 2017-12-04 DIAGNOSIS — F419 Anxiety disorder, unspecified: Secondary | ICD-10-CM | POA: Diagnosis present

## 2017-12-04 DIAGNOSIS — J45909 Unspecified asthma, uncomplicated: Secondary | ICD-10-CM | POA: Diagnosis present

## 2017-12-04 DIAGNOSIS — R55 Syncope and collapse: Secondary | ICD-10-CM

## 2017-12-04 DIAGNOSIS — Z86711 Personal history of pulmonary embolism: Secondary | ICD-10-CM | POA: Diagnosis not present

## 2017-12-04 DIAGNOSIS — I2510 Atherosclerotic heart disease of native coronary artery without angina pectoris: Secondary | ICD-10-CM | POA: Diagnosis present

## 2017-12-04 DIAGNOSIS — N2581 Secondary hyperparathyroidism of renal origin: Secondary | ICD-10-CM | POA: Diagnosis present

## 2017-12-04 DIAGNOSIS — E1022 Type 1 diabetes mellitus with diabetic chronic kidney disease: Secondary | ICD-10-CM | POA: Diagnosis present

## 2017-12-04 DIAGNOSIS — Z992 Dependence on renal dialysis: Secondary | ICD-10-CM | POA: Diagnosis not present

## 2017-12-04 DIAGNOSIS — E875 Hyperkalemia: Secondary | ICD-10-CM | POA: Diagnosis not present

## 2017-12-04 DIAGNOSIS — E7800 Pure hypercholesterolemia, unspecified: Secondary | ICD-10-CM | POA: Diagnosis present

## 2017-12-04 DIAGNOSIS — Z7902 Long term (current) use of antithrombotics/antiplatelets: Secondary | ICD-10-CM | POA: Diagnosis not present

## 2017-12-04 DIAGNOSIS — M898X9 Other specified disorders of bone, unspecified site: Secondary | ICD-10-CM | POA: Diagnosis present

## 2017-12-04 DIAGNOSIS — N186 End stage renal disease: Secondary | ICD-10-CM | POA: Diagnosis present

## 2017-12-04 DIAGNOSIS — F329 Major depressive disorder, single episode, unspecified: Secondary | ICD-10-CM | POA: Diagnosis present

## 2017-12-04 DIAGNOSIS — Z7982 Long term (current) use of aspirin: Secondary | ICD-10-CM | POA: Diagnosis not present

## 2017-12-04 DIAGNOSIS — D631 Anemia in chronic kidney disease: Secondary | ICD-10-CM | POA: Diagnosis present

## 2017-12-04 DIAGNOSIS — Z823 Family history of stroke: Secondary | ICD-10-CM | POA: Diagnosis not present

## 2017-12-04 DIAGNOSIS — I132 Hypertensive heart and chronic kidney disease with heart failure and with stage 5 chronic kidney disease, or end stage renal disease: Secondary | ICD-10-CM | POA: Diagnosis present

## 2017-12-04 LAB — RENAL FUNCTION PANEL
ALBUMIN: 3.4 g/dL — AB (ref 3.5–5.0)
Anion gap: 14 (ref 5–15)
BUN: 79 mg/dL — AB (ref 6–20)
CO2: 24 mmol/L (ref 22–32)
Calcium: 8.4 mg/dL — ABNORMAL LOW (ref 8.9–10.3)
Chloride: 102 mmol/L (ref 98–111)
Creatinine, Ser: 9.83 mg/dL — ABNORMAL HIGH (ref 0.44–1.00)
GFR calc Af Amer: 5 mL/min — ABNORMAL LOW (ref >60)
GFR calc non Af Amer: 4 mL/min — ABNORMAL LOW (ref >60)
GLUCOSE: 149 mg/dL — AB (ref 70–99)
PHOSPHORUS: 8.9 mg/dL — AB (ref 2.5–4.6)
POTASSIUM: 5.1 mmol/L (ref 3.5–5.1)
Sodium: 140 mmol/L (ref 135–145)

## 2017-12-04 LAB — CBC
HCT: 34.9 % — ABNORMAL LOW (ref 36.0–46.0)
Hemoglobin: 10.3 g/dL — ABNORMAL LOW (ref 12.0–15.0)
MCH: 23.4 pg — AB (ref 26.0–34.0)
MCHC: 29.5 g/dL — AB (ref 30.0–36.0)
MCV: 79.3 fL (ref 78.0–100.0)
Platelets: 199 10*3/uL (ref 150–400)
RBC: 4.4 MIL/uL (ref 3.87–5.11)
RDW: 16.7 % — AB (ref 11.5–15.5)
WBC: 3.5 10*3/uL — ABNORMAL LOW (ref 4.0–10.5)

## 2017-12-04 LAB — BASIC METABOLIC PANEL
ANION GAP: 13 (ref 5–15)
BUN: 79 mg/dL — AB (ref 6–20)
CO2: 24 mmol/L (ref 22–32)
Calcium: 8.4 mg/dL — ABNORMAL LOW (ref 8.9–10.3)
Chloride: 101 mmol/L (ref 98–111)
Creatinine, Ser: 9.93 mg/dL — ABNORMAL HIGH (ref 0.44–1.00)
GFR calc Af Amer: 5 mL/min — ABNORMAL LOW (ref >60)
GFR, EST NON AFRICAN AMERICAN: 4 mL/min — AB (ref >60)
GLUCOSE: 149 mg/dL — AB (ref 70–99)
Potassium: 5 mmol/L (ref 3.5–5.1)
Sodium: 138 mmol/L (ref 135–145)

## 2017-12-04 MED ORDER — CLOPIDOGREL BISULFATE 75 MG PO TABS: 75.0000 mg | ORAL_TABLET | Freq: Every day | ORAL | 3 refills | Status: DC

## 2017-12-04 MED ORDER — HEPARIN SODIUM (PORCINE) 1000 UNIT/ML DIALYSIS: 1000.0000 [IU] | INTRAMUSCULAR | Status: DC | PRN

## 2017-12-04 MED ORDER — SODIUM CHLORIDE 0.9 % IV SOLN: 100.0000 mL | INTRAVENOUS | Status: DC | PRN

## 2017-12-04 MED ORDER — CALCIUM ACETATE (PHOS BINDER) 667 MG PO CAPS: 667.0000 mg | ORAL_CAPSULE | Freq: Three times a day (TID) | ORAL | 3 refills | Status: DC

## 2017-12-04 MED ORDER — HEPARIN SODIUM (PORCINE) 1000 UNIT/ML DIALYSIS: 20.0000 [IU]/kg | INTRAMUSCULAR | Status: DC | PRN

## 2017-12-04 MED ORDER — DOXERCALCIFEROL 4 MCG/2ML IV SOLN
INTRAVENOUS | Status: AC
  Filled 2017-12-04: qty 2

## 2017-12-04 MED ORDER — ASPIRIN EC 81 MG PO TBEC: 81.0000 mg | DELAYED_RELEASE_TABLET | Freq: Every day | ORAL | 3 refills | Status: AC

## 2017-12-04 MED ORDER — CARVEDILOL 6.25 MG PO TABS: 6.2500 mg | ORAL_TABLET | Freq: Two times a day (BID) | ORAL | 2 refills | Status: DC

## 2017-12-04 MED ORDER — METOPROLOL TARTRATE 5 MG/5ML IV SOLN
5.0000 mg | Freq: Once | INTRAVENOUS | Status: AC
  Administered 2017-12-04: 5 mg via INTRAVENOUS

## 2017-12-04 MED ORDER — CHLORHEXIDINE GLUCONATE CLOTH 2 % EX PADS: 6.0000 | MEDICATED_PAD | Freq: Every day | CUTANEOUS | Status: DC

## 2017-12-04 MED ORDER — METOPROLOL TARTRATE 5 MG/5ML IV SOLN
INTRAVENOUS | Status: AC
  Administered 2017-12-04: 5 mg via INTRAVENOUS
  Filled 2017-12-04: qty 5

## 2017-12-04 MED ORDER — ALTEPLASE 2 MG IJ SOLR: 2.0000 mg | Freq: Once | INTRAMUSCULAR | Status: DC | PRN

## 2017-12-04 MED ORDER — DOXERCALCIFEROL 4 MCG/2ML IV SOLN
1.0000 ug | Freq: Once | INTRAVENOUS | Status: AC
  Administered 2017-12-04: 1 ug via INTRAVENOUS
  Filled 2017-12-04: qty 2

## 2017-12-04 MED ORDER — LATANOPROST 0.005 % OP SOLN: 1.0000 [drp] | Freq: Every day | OPHTHALMIC | 12 refills | Status: AC

## 2017-12-04 MED ORDER — ESCITALOPRAM OXALATE 10 MG PO TABS: 10.0000 mg | ORAL_TABLET | Freq: Every day | ORAL | 2 refills | Status: DC

## 2017-12-04 MED ORDER — LIDOCAINE-PRILOCAINE 2.5-2.5 % EX CREA: 1.0000 "application " | TOPICAL_CREAM | CUTANEOUS | Status: DC | PRN

## 2017-12-04 MED ORDER — PENTAFLUOROPROP-TETRAFLUOROETH EX AERO: 1.0000 "application " | INHALATION_SPRAY | CUTANEOUS | Status: DC | PRN

## 2017-12-04 MED ORDER — INSULIN LISPRO 100 UNIT/ML ~~LOC~~ SOLN: 2.0000 [IU] | Freq: Three times a day (TID) | SUBCUTANEOUS | 11 refills | Status: DC

## 2017-12-04 MED ORDER — DOXERCALCIFEROL 4 MCG/2ML IV SOLN: 1.0000 ug | INTRAVENOUS | Status: DC

## 2017-12-04 MED ORDER — GABAPENTIN 300 MG PO CAPS: 300.0000 mg | ORAL_CAPSULE | Freq: Every day | ORAL | 1 refills | Status: DC

## 2017-12-04 MED ORDER — METOPROLOL TARTRATE 5 MG/5ML IV SOLN
INTRAVENOUS | Status: AC
  Filled 2017-12-04: qty 5

## 2017-12-04 MED ORDER — ALBUTEROL SULFATE HFA 108 (90 BASE) MCG/ACT IN AERS: 2.0000 | INHALATION_SPRAY | Freq: Four times a day (QID) | RESPIRATORY_TRACT | 1 refills | Status: AC | PRN

## 2017-12-04 MED ORDER — LIDOCAINE HCL (PF) 1 % IJ SOLN: 5.0000 mL | INTRAMUSCULAR | Status: DC | PRN

## 2017-12-04 MED ORDER — AMLODIPINE BESYLATE 5 MG PO TABS: 5.0000 mg | ORAL_TABLET | Freq: Every day | ORAL | 1 refills | Status: DC

## 2017-12-04 MED ORDER — DIPHENHYDRAMINE HCL 25 MG PO CAPS
25.0000 mg | ORAL_CAPSULE | Freq: Once | ORAL | Status: AC
  Administered 2017-12-04: 25 mg via ORAL
  Filled 2017-12-04: qty 1

## 2017-12-04 MED ORDER — ONDANSETRON HCL 4 MG PO TABS: 4.0000 mg | ORAL_TABLET | Freq: Three times a day (TID) | ORAL | 0 refills | Status: AC | PRN

## 2017-12-04 MED ORDER — RENA-VITE PO TABS: 1.0000 | ORAL_TABLET | Freq: Every day | ORAL | 2 refills | Status: DC

## 2017-12-04 NOTE — Consult Note (Signed)
[facility] Renal Consultation Note    Indication for Consultation:  Management of ESRD/hemodialysis; anemia, hypertension/volume and secondary hyperparathyroidism PCP: Campbell Stall, [name]  [id number] Lauren Warner is a 52 y.o. female with ESRD on TTS at [facility] HD unit who moved to [place] from [place] without dialysis arrangements 2 months ago.  PMHx is significant for DM, HTN, CHF, HA, anxiety/depression, CAD with prior hx of PCI.   This is the fourth admission since arrival.  She initially said her barrier to regular dialysis was not having transportation to a unit close to her.  This has since been arranged and attendance is still erratic.  Her most recent dialysis treatments were 6/24 (net UF 4 L to EDW of 96 and no post HD orthostasis and 7/2 net UF 4.6 post wt 95.3 below edw with orthostasis sitting BP 154/78 and standing BP 114/67 P 92.  EKG showed NSR.  The patient said after dialysis Tuesday she went to bed and slept all night.  When she got up Wed, she was weak, very sleepy and got out of bed and went to her bed room chair and "sat for hours."  She denies and fevers or chills.  She had multiple uncontrollable diarrheal stools and her "legs locked up at times" making it hard to get off the commode or couch."  She had that happen to her before when her K was high.  She has dizziness with standing. She had eaten only a little kiwi between Tuesday HD and presenting yesterday but her report.   She continues to make urine without dysuria.  She denies eating any spoiled food or recent restaurant food.   When she went to dialysis yesterday and passed out with standing for a weight  and was sent to the ED for evaluation and was found to have a K of 6.  She was treated with kayexalate, had a diarrhea stool but not others since. No N and V since arrival.  Other admission labs per pertinent for CO2 25 glu 187 BUN 77 Cr 9.6 LFT normal WBC normal hgb 10.2 plts 216 CXR showed CM with borderline vascular  congestion. Admission weight was initially 85.7 and 5 hours later 98.4. Presenting BP were 150s - 160s sats were 100% on room air. She is not SOB, only sleepy with sensation of head spinning while sitting still.   Past Medical History:  Diagnosis Date  . Anemia   . Anxiety   . Asthma   . CHF (congestive heart failure) ([place])   . Coronary artery disease   . Daily headache   . Depression   . ESRD (end stage renal disease) on dialysis ([place])    "[facility]; TTS" (10/19/2017)  . High cholesterol   . History of blood transfusion 10/19/2017   "anemia"  . Hyperkalemia 10/2017  . Hypertension   . On home oxygen therapy    "2L; daily" (10/19/2017)  . Pneumonia    "several times" (10/19/2017)  . Pulmonary embolism ([place]) 2012  . Type 1 diabetes mellitus ([place])    Past Surgical History:  Procedure Laterality Date  . AV FISTULA PLACEMENT Left 2018  . CESAREAN SECTION  1989  . CORONARY ANGIOPLASTY WITH STENT PLACEMENT  ~ 08/2017   "3 stents" (10/19/2017)  . ENDOMETRIAL ABLATION  ~ 2011   Family History  Problem Relation Age of Onset  . Stroke Sister    Social History:  reports that she has never smoked. She has never used smokeless tobacco. She reports that  she does not drink alcohol or use drugs. Allergies  Allergen Reactions  . Adhesive [Tape] Other (See Comments)    "RIPS MY SKIN," SO PLEASE USE COBAN WRAP!!  . Hydrocodone-Acetaminophen Other (See Comments)    "makes me fell crazy"  . Metformin And Related Other (See Comments)    "Makes me feel crazy"   Prior to Admission medications   Medication Sig Start Date End Date Taking? Authorizing Provider  albuterol (PROAIR HFA) 108 (90 Base) MCG/ACT inhaler Inhale 2 puffs into the lungs every 6 (six) hours as needed for wheezing or shortness of breath.   Yes [provider]  amLODipine (NORVASC) 10 MG tablet Take 1 tablet (10 mg total) by mouth daily. Patient taking differently: Take 10 mg by mouth at bedtime.  11/12/17  Yes Domenic Polite, MD  aspirin EC 81 MG tablet Take 1 tablet (81 mg total) by mouth daily. Patient taking differently: Take 81 mg by mouth at bedtime.  11/12/17  Yes Domenic Polite, MD  calcium acetate (PHOSLO) 667 MG capsule Take 1 capsule (667 mg total) by mouth 3 (three) times daily with meals. 11/12/17  Yes Domenic Polite, MD  carvedilol (COREG) 12.5 MG tablet Take 1 tablet (12.5 mg total) by mouth 2 (two) times daily with a meal. 11/12/17  Yes Domenic Polite, MD  clopidogrel (PLAVIX) 75 MG tablet Take 1 tablet (75 mg total) by mouth daily. 11/12/17  Yes Domenic Polite, MD  escitalopram (LEXAPRO) 10 MG tablet Take 1 tablet (10 mg total) by mouth daily. 11/12/17  Yes Domenic Polite, MD  gabapentin (NEURONTIN) 300 MG capsule Take 1 capsule (300 mg total) by mouth at bedtime. 11/12/17  Yes Domenic Polite, MD  insulin lispro (HUMALOG) 100 UNIT/ML injection Inject 2 Units into the skin 3 (three) times daily before meals.    [provider]  Insulin NPH, Human,, Isophane, (HUMULIN N KWIKPEN) 100 UNIT/ML Kiwkpen Inject 10 Units into the skin daily before breakfast.    [provider]  latanoprost (XALATAN) 0.005 % ophthalmic solution Place 1 drop into both eyes at bedtime.    [provider]  multivitamin (RENA-VIT) TABS tablet Take 1 tablet by mouth at bedtime. 10/21/17   Geradine Girt, DO   Current Facility-Administered Medications  Medication Dose Route Frequency Provider Last Rate Last Dose  . 0.9 %  sodium chloride infusion   Intravenous Continuous Ghimire, Shanker M, MD      . albuterol (PROVENTIL) (2.5 MG/3ML) 0.083% nebulizer solution 2.5 mg  2.5 mg Nebulization Q2H PRN Ghimire, Henreitta Leber, MD      . aspirin EC tablet 81 mg  81 mg Oral Daily Ghimire, Shanker M, MD      . calcium acetate (PHOSLO) capsule 667 mg  667 mg Oral TID WC Ghimire, Shanker M, MD      . carvedilol (COREG) tablet 6.25 mg  6.25 mg Oral BID WC Ghimire, Shanker M, MD      . clopidogrel (PLAVIX) tablet 75  mg  75 mg Oral Daily Ghimire, Shanker M, MD      . escitalopram (LEXAPRO) tablet 10 mg  10 mg Oral Daily Ghimire, Shanker M, MD      . gabapentin (NEURONTIN) capsule 300 mg  300 mg Oral QHS Jonetta Osgood, MD   300 mg at 12/03/17 2209  . heparin injection 5,000 Units  5,000 Units Subcutaneous Q8H Jonetta Osgood, MD   5,000 Units at 12/04/17 0549  . latanoprost (XALATAN) 0.005 % ophthalmic solution 1  drop  1 drop Both Eyes QHS Jonetta Osgood, MD   1 drop at 12/03/17 2209  . multivitamin (RENA-VIT) tablet 1 tablet  1 tablet Oral QHS Jonetta Osgood, MD   1 tablet at 12/03/17 2209  . ondansetron (ZOFRAN) tablet 4 mg  4 mg Oral Q6H PRN Jonetta Osgood, MD       Or  . ondansetron [facility]) injection 4 mg  4 mg Intravenous Q6H PRN Jonetta Osgood, MD       Labs: Basic Metabolic Panel: Recent Labs  Lab 12/03/17 1440 12/04/17 0503  NA 137 140  138  K 6.0* 5.1  5.0  CL 98 102  101  CO2 25 24  24   GLUCOSE 187* 149*  149*  BUN 77* 79*  79*  CREATININE 9.63* 9.83*  9.93*  CALCIUM 8.6* 8.4*  8.4*  PHOS  --  8.9*   Liver Function Tests: Recent Labs  Lab 12/03/17 1440 12/04/17 0503  AST 17  --   ALT 16  --   ALKPHOS 101  --   BILITOT 0.5  --   PROT 7.6  --   ALBUMIN 3.7 3.4*   Recent Labs  Lab 12/03/17 1440  LIPASE 90*   No results for input(s): AMMONIA in the last 168 hours. CBC: Recent Labs  Lab 12/03/17 1440 12/04/17 0503  WBC 4.7 3.5*  NEUTROABS 2.8  --   HGB 10.2* 10.3*  HCT 33.8* 34.9*  MCV 79.0 79.3  PLT 216 199   Cardiac Enzymes: No results for input(s): CKTOTAL, CKMB, CKMBINDEX, TROPONINI in the last 168 hours. CBG: Recent Labs  Lab 12/03/17 1831  GLUCAP 150*   Iron Studies: No results for input(s): IRON, TIBC, TRANSFERRIN, FERRITIN in the last 72 hours. Studies/Results: Dg Chest 2 View  Result Date: 12/03/2017 CLINICAL DATA:  Syncope. EXAM: CHEST - 2 VIEW COMPARISON:  11/10/2017 FINDINGS: 1351 hours. Stable asymmetric elevation  right hemidiaphragm. Cardiopericardial silhouette is at upper limits of normal for size. There is pulmonary vascular congestion without overt pulmonary edema. The visualized bony structures of the thorax are intact. Telemetry leads overlie the chest. IMPRESSION: Borderline cardiomegaly with vascular congestion. Electronically Signed   By: Misty Stanley M.D.   On: 12/03/2017 14:01    ROS: As per HPI otherwise negative.  Physical Exam: Vitals:   12/03/17 1816 12/03/17 1831 12/03/17 2148 12/04/17 0514  BP:  (!) 164/84 (!) 145/80 128/75  Pulse:  87 84 78  Resp:  16 16 20   Temp:  97.8 F (36.6 C) 98.6 F (37 C) 99 F (37.2 C)  TempSrc:  Oral Oral Oral  SpO2:  100% 96% 95%  Weight: 98.4 kg (216 lb 14.9 oz)  99.8 kg (220 lb 0.3 oz)   Height: 5\' 7"  (1.702 m)        Genera; obese female breathing easily in bed Head: NCAT sclera not icteric MMM Neck: Supple.  Lungs: dim BS poor expansion Heart: RRR with S1 S2.  Abdomen: soft NT + BS Lower extremities :without edema or ischemic changes, dressing on cracked left heel Neuro: A & O  X 3. Moves all extremities spontaneously Psych:  Responds to questions appropriately with depressed sluggish affect, sleepy- becomes more anitmated at times Dialysis Access: left upper  AVF + bruit  Dialysis Orders: AF TTS 4 hr 350/800 EDW 96 2 K 2.25 Ca left upper AVF no profile heparin 4000 Hectorol 1  No Fe or ESA ordered Recent labs: hgb 9 - last [facility]a  100 6/1, tsat 21% 5/14 ferritin 855 s/p course of Fe - iPTH 474 Ca/P ok   Assessment/Plan: 1. Syncope -symptoms sound somewhat like vertigo given she is having sensation of room spinning while at rest; need to check orthostatics; this doesn't explain diarrhea she had Tuesday after HD; excessively sleepy - neurontin on hold 2. ESRD -  TTS - off schedule due to missed HD yesterday - plan HD today K down to 5.1 after kayexalate- repeat HD again Saturday back on schedule 3. Hypertension/volume  - orthostasis  Tuesday - check orthostatics pre HD - may need to raise EDW slightly - or adjust meds - pre HD meds on hold this am - check orthostatics pre HD and post 4. Anemia  - hgb 10.2 - currently not on ESA or Fe -apparently missed her monthly labs when she only went to one HD treatment last week 5. Metabolic bone disease -  cointinue binders/ VDRA 6. Nutrition - alb - renal carb mod diet/vits 7. Diarrhea - only one episode since admission after kayexalate but hasn't eaten much; resuming diet today - watch trend  Myriam Jacobson, PA-C [place] [phone] 12/04/2017, 9:01 AM

## 2017-12-04 NOTE — Progress Notes (Signed)
Attempted to discharge patient however she does not have a ride, social worker is aware and will get a cab voucher. Patient also mentioned she is out of home prescriptions including lexapro and carvedilol, md is aware

## 2017-12-04 NOTE — Discharge Instructions (Signed)
1)Your Amlodipine and Carvedilol dosage is been adjusted 2)Continue Hemodialysis per usual schedule 3)Very low-salt diet advised 4)Weigh yourself daily, call if you gain more than 3 pounds in 1 day or more than 5 pounds in 1 week as your diuretic medications may need to be adjusted 5)Limit your Fluid  intake to no more than 60 ounces (1.8 Liters) per day

## 2017-12-04 NOTE — Clinical Social Work Note (Signed)
Patient medically stable for discharge and provided with a taxi voucher for transport home. Voucher given to patient's nurse. CSW signing off as no other SW intervention needs.  Lindbergh Winkles Givens, MSW, LCSW Licensed Clinical Social Worker [place] [phone]

## 2017-12-04 NOTE — Progress Notes (Signed)
Pt pulled off sticker to tele monitor and scratched top layer of skin off with pt's finger nails, on chest in two small places. Site was clean and not bleeding. RN covered with non adherent bandage.   Eleanora Neighbor, RN

## 2017-12-04 NOTE — Discharge Summary (Signed)
Lauren Warner, is a 52 y.o. female  DOB 1965-07-31  MRN [id number].  Admission date:  12/03/2017  Admitting Physician  Jonetta Osgood, MD  Discharge Date:  12/04/2017   Primary MD  [name], [name]  Recommendations for primary care physician for things to follow:   1)Your Amlodipine and Carvedilol dosage is been adjusted 2)Continue Hemodialysis per usual schedule 3)Very low-salt diet advised 4)Weigh yourself daily, call if you gain more than 3 pounds in 1 day or more than 5 pounds in 1 week as your diuretic medications may need to be adjusted 5)Limit your Fluid  intake to no more than 60 ounces (1.8 Liters) per day   Admission Diagnosis  Syncope and collapse [R55] Hyperkalemia [E87.5] ESRD on hemodialysis ([place]) [N18.6, Z99.2] Nausea vomiting and diarrhea [R11.2, R19.7]   Discharge Diagnosis  Syncope and collapse [R55] Hyperkalemia [E87.5] ESRD on hemodialysis ([place]) [N18.6, Z99.2] Nausea vomiting and diarrhea [R11.2, R19.7]    Active Problems:   Essential hypertension   CAD (coronary artery disease)   ESRD (end stage renal disease) (HCC)   Syncope   Nausea vomiting and diarrhea   DM (diabetes mellitus), type 2 with renal complications (HCC)   Hyperkalemia   Syncope and collapse      Past Medical History:  Diagnosis Date  . Anemia   . Anxiety   . Asthma   . CHF (congestive heart failure) ([place])   . Coronary artery disease   . Daily headache   . Depression   . ESRD (end stage renal disease) on dialysis ([place])    "[facility]; TTS" (10/19/2017)  . High cholesterol   . History of blood transfusion 10/19/2017   "anemia"  . Hyperkalemia 10/2017  . Hypertension   . On home oxygen therapy    "2L; daily" (10/19/2017)  . Pneumonia    "several times" (10/19/2017)  . Pulmonary embolism ([place]) 2012  . Type 1 diabetes mellitus ([place])     Past Surgical History:  Procedure  Laterality Date  . AV FISTULA PLACEMENT Left 2018  . CESAREAN SECTION  1989  . CORONARY ANGIOPLASTY WITH STENT PLACEMENT  ~ 08/2017   "3 stents" (10/19/2017)  . ENDOMETRIAL ABLATION  ~ 2011       HPI  from the history and physical done on the day of admission:    HPI: Lauren Warner is a 52 y.o. female with medical history significant of ESRD on HD (TTS), CAD status post PCI several years back on aspirin and Plavix, hypertension, remote history of pulmonary embolism-presents to the ED directly from her hemodialysis center for evaluation of a syncopal episode.  Per patient for the past 3 days or so she has had numerous episodes of nausea, vomiting and diarrhea.  Per patient-she has had approximately 10-15 loose stools in a 24.  For the past 3 days.  Stools are watery-did not contain blood or mucus.  She has mild lower abdominal pain.  She has also had 3-4 episodes of vomiting on a daily basis.  She has  been feeling dizzy for the past few days, her last dialysis was on Tuesday-she notes feeling dizzy that day as well.  Dizziness is mostly when she stands up after sitting/lying down for a long period of time.  Her last diarrhea and vomiting was around 6 AM this morning-since then she has had no episodes.  She went to hemodialysis this morning-she was dizzy when moving around-she then sustained a syncopal episode when she stood up from a chair while she was waiting at the dialysis center.  She noted that when she stood up-she felt very dizzy and lightheaded and subsequently passed out.  She denies any tongue bite or urinary incontinence.  She was subsequently transported to the emergency room for further evaluation, where he was asked to admit this patient.  ED Course:  -patient was given 1 L of fluid.  The potassium was found to be elevated-after discussing case with nephrology, ED PA-C gave 30 g of Kayexalate.  Per ED PA-C nephrology to see this patient tomorrow for possible HD.        Hospital  Course:    1) syncope in the setting of persistent/intractable vomiting and diarrhea--orthostatic vital signs positive on admission, patient received IV fluids subsequently dialyzed orthostatic vitals as of 12/04/2017 has improved significantly, please see physical therapy note.  Rule out for ACS,  2) hyperkalemia-initially treated with Kayexalate, subsequently hyperkalemia was addressed with hemodialysis  3)N/V/D---patient had vomiting or diarrhea for 3 days prior to admission no further diarrhea since admission (during the hospital stay patient had one loose stool after Kayexalate otherwise no diarrhea), emesis resolved as well, tolerating oral intake well.  No indication for further GI studies at this time given spontaneous resolution of symptoms  4) hypertension-patient has been discharged on reduced dose of amlodipine and reduced dose of carvedilol, both of these can be titrated up further as BP increases  5)h/o CAD-no ACS type symptoms at this time, EKG and telemetry without acute changes at this time  6)ESRD--continue hemodialysis on Tuesdays Thursdays and Saturdays, nephrology input appreciated  7)Generalized weakness/debility--PT evaluation appreciated, patient will need home health PT post discharge  8) chronic anemia of CKD-hemoglobin above 10, ESA/EPO, iron as per nephrologist   Discharge Condition: stable  Follow UP--PCP and nephrologist   Consults obtained -nephrology  Diet and Activity recommendation:  As advised  Discharge Instructions    Discharge Instructions    ([place]) Call MD:  Anytime you have any of the following symptoms: 1) 3 pound weight gain in 24 hours or 5 pounds in 1 week 2) shortness of breath, with or without a dry hacking cough 3) swelling in the hands, feet or stomach 4) if you have to sleep on extra pillows at night in order to breathe.   Complete by:  As directed    Call MD for:  difficulty breathing, headache or visual disturbances    Complete by:  As directed    Call MD for:  persistant dizziness or light-headedness   Complete by:  As directed    Call MD for:  persistant nausea and vomiting   Complete by:  As directed    Call MD for:  severe uncontrolled pain   Complete by:  As directed    Call MD for:  temperature >100.4   Complete by:  As directed    Diet - low sodium heart healthy   Complete by:  As directed    Discharge instructions   Complete by:  As directed  1)Your Amlodipine and Carvedilol dosage is been adjusted 2)Continue Hemodialysis per usual schedule 3)Very low-salt diet advised 4)Weigh yourself daily, call if you gain more than 3 pounds in 1 day or more than 5 pounds in 1 week as your diuretic medications may need to be adjusted 5)Limit your Fluid  intake to no more than 60 ounces (1.8 Liters) per day   Increase activity slowly   Complete by:  As directed         Discharge Medications     Allergies as of 12/04/2017      Reactions   Adhesive [tape] Other (See Comments)   "RIPS MY SKIN," [place]!!   Hydrocodone-acetaminophen Other (See Comments)   "makes me fell crazy"   Metformin And Related Other (See Comments)   "Makes me feel crazy"      Medication List    TAKE these medications   amLODipine 5 MG tablet Commonly known as:  NORVASC Take 1 tablet (5 mg total) by mouth daily. What changed:    medication strength  how much to take   aspirin EC 81 MG tablet Take 1 tablet (81 mg total) by mouth daily. What changed:  when to take this   calcium acetate 667 MG capsule Commonly known as:  PHOSLO Take 1 capsule (667 mg total) by mouth 3 (three) times daily with meals.   carvedilol 6.25 MG tablet Commonly known as:  COREG Take 1 tablet (6.25 mg total) by mouth 2 (two) times daily with a meal. What changed:    medication strength  how much to take   clopidogrel 75 MG tablet Commonly known as:  PLAVIX Take 1 tablet (75 mg total) by mouth daily.     escitalopram 10 MG tablet Commonly known as:  LEXAPRO Take 1 tablet (10 mg total) by mouth daily.   gabapentin 300 MG capsule Commonly known as:  NEURONTIN Take 1 capsule (300 mg total) by mouth at bedtime.   HUMALOG 100 UNIT/ML injection Generic drug:  insulin lispro Inject 2 Units into the skin 3 (three) times daily before meals.   HUMULIN N KWIKPEN 100 UNIT/ML Kiwkpen Generic drug:  Insulin NPH (Human) (Isophane) Inject 10 Units into the skin daily before breakfast.   latanoprost 0.005 % ophthalmic solution Commonly known as:  XALATAN Place 1 drop into both eyes at bedtime.   multivitamin Tabs tablet Take 1 tablet by mouth at bedtime.   ondansetron 4 MG tablet Commonly known as:  ZOFRAN Take 1 tablet (4 mg total) by mouth every 8 (eight) hours as needed for nausea or vomiting.   PROAIR HFA 108 (90 Base) MCG/ACT inhaler Generic drug:  albuterol Inhale 2 puffs into the lungs every 6 (six) hours as needed for wheezing or shortness of breath.       Major procedures and Radiology Reports - PLEASE review detailed and final reports for all details, in brief -   Dg Chest 2 View  Result Date: 12/03/2017 CLINICAL DATA:  Syncope. EXAM: CHEST - 2 VIEW COMPARISON:  11/10/2017 FINDINGS: 1351 hours. Stable asymmetric elevation right hemidiaphragm. Cardiopericardial silhouette is at upper limits of normal for size. There is pulmonary vascular congestion without overt pulmonary edema. The visualized bony structures of the thorax are intact. Telemetry leads overlie the chest. IMPRESSION: Borderline cardiomegaly with vascular congestion. Electronically Signed   By: Misty Stanley M.D.   On: 12/03/2017 14:01   Dg Chest Portable 1 View  Result Date: 11/10/2017 CLINICAL DATA:  Shortness of breath.  CHF.  Kidney disease. EXAM: PORTABLE CHEST 1 VIEW COMPARISON:  10/18/2017. FINDINGS: Cardiomegaly with diffuse pulmonary interstitial prominence. Interstitial prominence is increased slightly from  prior study of 10/18/2017. These findings are consistent with CHF with progressive pulmonary interstitial edema. Pneumonitis cannot be excluded. No pneumothorax. IMPRESSION: Cardiomegaly with interstitial pulmonary edema. Interim slight progression from prior exam. Electronically Signed   By: [place]   On: 11/10/2017 12:36    Micro Results    Recent Results (from the past 240 hour(s))  MRSA PCR Screening     Status: None   Collection Time: 12/03/17  6:33 PM  Result Value Ref Range Status   MRSA by PCR NEGATIVE NEGATIVE Final    Comment:        The GeneXpert MRSA Assay (FDA approved for NASAL specimens only), is one component of a comprehensive MRSA colonization surveillance program. It is not intended to diagnose MRSA infection nor to guide or monitor treatment for MRSA infections. Performed at [facility], [place] [address] [place], [place] [address]        Today   Subjective    Lauren Warner today has no complaints, tolerated hemodialysis well, eating and drinking well, no further vomiting, no further concerns about diarrhea at this time no fevers no chills  during the hospital stay patient had one loose stool after Kayexalate otherwise no diarrhea          Patient has been seen and examined prior to discharge   Objective   Blood pressure (!) 157/91, pulse 82, temperature 98.3 F (36.8 C), temperature source Oral, resp. rate 18, height 5\' 7"  (1.702 m), weight 96.5 kg (212 lb 11.9 oz), last menstrual period 10/18/2009, SpO2 100 %.   Intake/Output Summary (Last 24 hours) at 12/04/2017 1847 Last data filed at 12/04/2017 1731 Gross per 24 hour  Intake 120 ml  Output 3000 ml  Net -2880 ml    Exam Gen:- Awake Alert,  , speaking in complete sentences HEENT:- [place].AT, No sclera icterus Neck-Supple Neck,No JVD,.  Lungs-  CTAB , fairly symmetrical air movement CV- S1, S2 normal Abd-  +ve B.Sounds, Abd Soft, No tenderness,    Extremity/Skin:-Left upper  extremity AV fistula with positive thrill and bruit Psych-affect is appropriate, oriented x3 Neuro-no new focal deficits, no tremors   Data Review   CBC w Diff:  Lab Results  Component Value Date   WBC 3.5 (L) 12/04/2017   HGB 10.3 (L) 12/04/2017   HCT 34.9 (L) 12/04/2017   PLT 199 12/04/2017   LYMPHOPCT 27 12/03/2017   MONOPCT 8 12/03/2017   EOSPCT 4 12/03/2017   BASOPCT 1 12/03/2017    CMP:  Lab Results  Component Value Date   NA 140 12/04/2017   NA 138 12/04/2017   K 5.1 12/04/2017   K 5.0 12/04/2017   CL 102 12/04/2017   CL 101 12/04/2017   CO2 24 12/04/2017   CO2 24 12/04/2017   BUN 79 (H) 12/04/2017   BUN 79 (H) 12/04/2017   CREATININE 9.83 (H) 12/04/2017   CREATININE 9.93 (H) 12/04/2017   PROT 7.6 12/03/2017   ALBUMIN 3.4 (L) 12/04/2017   BILITOT 0.5 12/03/2017   ALKPHOS 101 12/03/2017   AST 17 12/03/2017   ALT 16 12/03/2017   Total Discharge time is about 33 minutes  Roxan Hockey M.D on 12/04/2017 at 6:47 PM  [facility]   Office  [phone]  Voice Recognition Viviann Spare dictation system was used to create this note, attempts have  been made to correct errors. Please contact the author with questions and/or clarifications.
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
## 2017-12-04 NOTE — Procedures (Signed)
Patient seen on Hemodialysis. QB 400, UF goal 3L HR in 130s-140s---sinus tachycardia--give IV metoprolol 5mg  x 1 dose Treatment adjusted as needed.  Elmarie Shiley MD [facility]. Office # [phone] Pager # [phone] 1:37 PM

## 2017-12-04 NOTE — Progress Notes (Signed)
Discharge instructions and D/C paper and prescriptions given to patient. Patient is aware to pick up carvedilol from her [place] at [place]. Patient verbalized understanding,questions answered. Patient is also aware of follow up appointment. Patient discharged home via taxi cab. Belongings including cell phone sent with patient. Syniah Berne, Wonda Cheng, Therapist, sports

## 2017-12-22 ENCOUNTER — Observation Stay (HOSPITAL_COMMUNITY)
Admission: EM | Admit: 2017-12-22 | Discharge: 2017-12-24 | Disposition: A | Discharge status: Home / Self Care | Payer: Medicaid Other | Attending: Internal Medicine | Admitting: Internal Medicine

## 2017-12-22 ENCOUNTER — Emergency Department (HOSPITAL_COMMUNITY): Payer: Medicaid Other

## 2017-12-22 ENCOUNTER — Encounter (HOSPITAL_COMMUNITY): Payer: Self-pay

## 2017-12-22 DIAGNOSIS — R197 Diarrhea, unspecified: Secondary | ICD-10-CM | POA: Diagnosis present

## 2017-12-22 DIAGNOSIS — J45909 Unspecified asthma, uncomplicated: Secondary | ICD-10-CM | POA: Diagnosis not present

## 2017-12-22 DIAGNOSIS — I161 Hypertensive emergency: Secondary | ICD-10-CM | POA: Diagnosis not present

## 2017-12-22 DIAGNOSIS — D631 Anemia in chronic kidney disease: Secondary | ICD-10-CM | POA: Diagnosis present

## 2017-12-22 DIAGNOSIS — R109 Unspecified abdominal pain: Secondary | ICD-10-CM | POA: Diagnosis present

## 2017-12-22 DIAGNOSIS — I674 Hypertensive encephalopathy: Secondary | ICD-10-CM | POA: Diagnosis not present

## 2017-12-22 DIAGNOSIS — I2510 Atherosclerotic heart disease of native coronary artery without angina pectoris: Secondary | ICD-10-CM | POA: Diagnosis not present

## 2017-12-22 DIAGNOSIS — Z992 Dependence on renal dialysis: Secondary | ICD-10-CM | POA: Diagnosis not present

## 2017-12-22 DIAGNOSIS — Z79899 Other long term (current) drug therapy: Secondary | ICD-10-CM | POA: Diagnosis not present

## 2017-12-22 DIAGNOSIS — I160 Hypertensive urgency: Secondary | ICD-10-CM | POA: Diagnosis present

## 2017-12-22 DIAGNOSIS — E1022 Type 1 diabetes mellitus with diabetic chronic kidney disease: Secondary | ICD-10-CM | POA: Insufficient documentation

## 2017-12-22 DIAGNOSIS — I132 Hypertensive heart and chronic kidney disease with heart failure and with stage 5 chronic kidney disease, or end stage renal disease: Secondary | ICD-10-CM | POA: Diagnosis not present

## 2017-12-22 DIAGNOSIS — Z7982 Long term (current) use of aspirin: Secondary | ICD-10-CM | POA: Diagnosis not present

## 2017-12-22 DIAGNOSIS — N186 End stage renal disease: Secondary | ICD-10-CM | POA: Diagnosis not present

## 2017-12-22 DIAGNOSIS — Z7902 Long term (current) use of antithrombotics/antiplatelets: Secondary | ICD-10-CM | POA: Insufficient documentation

## 2017-12-22 DIAGNOSIS — I509 Heart failure, unspecified: Secondary | ICD-10-CM | POA: Insufficient documentation

## 2017-12-22 DIAGNOSIS — R112 Nausea with vomiting, unspecified: Secondary | ICD-10-CM | POA: Diagnosis present

## 2017-12-22 DIAGNOSIS — E1129 Type 2 diabetes mellitus with other diabetic kidney complication: Secondary | ICD-10-CM | POA: Diagnosis present

## 2017-12-22 LAB — PROTIME-INR
INR: 0.97
Prothrombin Time: 12.8 seconds (ref 11.4–15.2)

## 2017-12-22 LAB — CBC WITH DIFFERENTIAL/PLATELET
Abs Immature Granulocytes: 0 10*3/uL (ref 0.0–0.1)
Basophils Absolute: 0 10*3/uL (ref 0.0–0.1)
Basophils Relative: 1 %
EOS ABS: 0.2 10*3/uL (ref 0.0–0.7)
EOS PCT: 4 %
HEMATOCRIT: 38.1 % (ref 36.0–46.0)
Hemoglobin: 11.4 g/dL — ABNORMAL LOW (ref 12.0–15.0)
Immature Granulocytes: 0 %
LYMPHS ABS: 0.7 10*3/uL (ref 0.7–4.0)
Lymphocytes Relative: 15 %
MCH: 23.5 pg — ABNORMAL LOW (ref 26.0–34.0)
MCHC: 29.9 g/dL — ABNORMAL LOW (ref 30.0–36.0)
MCV: 78.4 fL (ref 78.0–100.0)
Monocytes Absolute: 0.3 10*3/uL (ref 0.1–1.0)
Monocytes Relative: 6 %
Neutro Abs: 3.3 10*3/uL (ref 1.7–7.7)
Neutrophils Relative %: 74 %
Platelets: 188 10*3/uL (ref 150–400)
RBC: 4.86 MIL/uL (ref 3.87–5.11)
RDW: 18.4 % — AB (ref 11.5–15.5)
WBC: 4.5 10*3/uL (ref 4.0–10.5)

## 2017-12-22 LAB — I-STAT BETA HCG BLOOD, ED (MC, WL, AP ONLY)

## 2017-12-22 LAB — COMPREHENSIVE METABOLIC PANEL
ALK PHOS: 129 U/L — AB (ref 38–126)
ALT: 16 U/L (ref 0–44)
AST: 12 U/L — ABNORMAL LOW (ref 15–41)
Albumin: 3.9 g/dL (ref 3.5–5.0)
Anion gap: 15 (ref 5–15)
BUN: 31 mg/dL — AB (ref 6–20)
CALCIUM: 8.8 mg/dL — AB (ref 8.9–10.3)
CHLORIDE: 96 mmol/L — AB (ref 98–111)
CO2: 28 mmol/L (ref 22–32)
CREATININE: 5.03 mg/dL — AB (ref 0.44–1.00)
GFR calc non Af Amer: 9 mL/min — ABNORMAL LOW (ref >60)
GFR, EST AFRICAN AMERICAN: 11 mL/min — AB (ref >60)
Glucose, Bld: 126 mg/dL — ABNORMAL HIGH (ref 70–99)
Potassium: 3.9 mmol/L (ref 3.5–5.1)
SODIUM: 139 mmol/L (ref 135–145)
Total Bilirubin: 1.3 mg/dL — ABNORMAL HIGH (ref 0.3–1.2)
Total Protein: 8.6 g/dL — ABNORMAL HIGH (ref 6.5–8.1)

## 2017-12-22 LAB — I-STAT CG4 LACTIC ACID, ED: Lactic Acid, Venous: 0.96 mmol/L (ref 0.5–1.9)

## 2017-12-22 MED ORDER — IOHEXOL 300 MG/ML  SOLN
100.0000 mL | Freq: Once | INTRAMUSCULAR | Status: AC | PRN
  Administered 2017-12-22: 100 mL via INTRAVENOUS

## 2017-12-22 MED ORDER — LABETALOL HCL 5 MG/ML IV SOLN: 80.0000 mg | Freq: Once | INTRAVENOUS | Status: DC | PRN

## 2017-12-22 MED ORDER — FENTANYL CITRATE (PF) 100 MCG/2ML IJ SOLN
50.0000 ug | Freq: Once | INTRAMUSCULAR | Status: AC
  Administered 2017-12-22: 50 ug via INTRAVENOUS
  Filled 2017-12-22: qty 2

## 2017-12-22 MED ORDER — ONDANSETRON HCL 4 MG/2ML IJ SOLN
4.0000 mg | Freq: Once | INTRAMUSCULAR | Status: AC
Start: 2017-12-22 — End: 2017-12-22
  Administered 2017-12-22: 4 mg via INTRAVENOUS
  Filled 2017-12-22: qty 2

## 2017-12-22 MED ORDER — LABETALOL HCL 5 MG/ML IV SOLN
20.0000 mg | Freq: Once | INTRAVENOUS | Status: AC
Start: 2017-12-22 — End: 2017-12-22
  Administered 2017-12-22: 20 mg via INTRAVENOUS
  Filled 2017-12-22: qty 4

## 2017-12-22 MED ORDER — LABETALOL HCL 5 MG/ML IV SOLN: 40.0000 mg | Freq: Once | INTRAVENOUS | Status: DC | PRN

## 2017-12-22 MED ORDER — HYDROMORPHONE HCL 1 MG/ML IJ SOLN: 1.0000 mg | Freq: Once | INTRAMUSCULAR | Status: DC

## 2017-12-22 MED ORDER — HYDRALAZINE HCL 20 MG/ML IJ SOLN
10.0000 mg | Freq: Once | INTRAMUSCULAR | Status: AC
  Administered 2017-12-22: 10 mg via INTRAVENOUS
  Filled 2017-12-22: qty 1

## 2017-12-22 NOTE — ED Notes (Signed)
Patient transported to X-ray 

## 2017-12-22 NOTE — ED Notes (Signed)
Pt returned from xray

## 2017-12-22 NOTE — ED Provider Notes (Signed)
[facility] EMERGENCY DEPARTMENT Provider Note   CSN: [id number] Arrival date & time: 12/22/17  1716     History   Chief Complaint Chief Complaint  Patient presents with  . Flank Pain    HPI Lauren Warner is a 52 y.o. female.  HPI  52 year old female with history of PE, type 1 diabetes, CHF, ESRD on hemodialysis since at least November, 2018 (started at [facility], [place]) comes in with chief complaint of flank pain and altered sensorium.  Patient was sent here from the dialysis center.  Patient did arrive to the dialysis center without any altered mental status, and therefore she is last normal at 7 AM.   Patient's only complaint is left-sided abdominal and back pain.  Patient's symptoms started today, and the pain is described as severe pain.  Patient has associated nausea without emesis and she denies any diarrhea.   Review of system is also negative for any focal numbness, tingling, vision change, dizziness.  Past Medical History:  Diagnosis Date  . Anemia   . Anxiety   . Asthma   . CHF (congestive heart failure) ([place])   . Coronary artery disease   . Daily headache   . Depression   . ESRD (end stage renal disease) on dialysis ([place])    "[facility]; TTS" (10/19/2017)  . High cholesterol   . History of blood transfusion 10/19/2017   "anemia"  . Hyperkalemia 10/2017  . Hypertension   . On home oxygen therapy    "2L; daily" (10/19/2017)  . Pneumonia    "several times" (10/19/2017)  . Pulmonary embolism ([place]) 2012  . Type 1 diabetes mellitus [facility])     Patient Active Problem List   Diagnosis Date Noted  . Hypertensive encephalopathy 12/23/2017  . Hypertensive urgency 12/22/2017  . Syncope and collapse 12/03/2017  . Hyperkalemia 11/10/2017  . Pressure injury of skin 10/20/2017  . Essential hypertension 10/19/2017  . CAD (coronary artery disease) 10/19/2017  . ESRD (end stage renal disease) ([place]) 10/19/2017  . Syncope 10/19/2017  . Anemia  due to end stage renal disease ([place]) 10/19/2017  . Closed left ankle fracture 10/19/2017  . Nausea vomiting and diarrhea 10/19/2017  . DM (diabetes mellitus), type 2 with renal complications ([place]) 33/35/4562  . Acute respiratory failure with hypoxia ([place]) 10/18/2017    Past Surgical History:  Procedure Laterality Date  . AV FISTULA PLACEMENT Left 2018  . CESAREAN SECTION  1989  . CORONARY ANGIOPLASTY WITH STENT PLACEMENT  ~ 08/2017   "3 stents" (10/19/2017)  . ENDOMETRIAL ABLATION  ~ 2011     OB History   None      Home Medications    Prior to Admission medications   Medication Sig Start Date End Date Taking? Authorizing Provider  albuterol (PROAIR HFA) 108 (90 Base) MCG/ACT inhaler Inhale 2 puffs into the lungs every 6 (six) hours as needed for wheezing or shortness of breath. 12/04/17  Yes Emokpae, Courage, MD  amLODipine (NORVASC) 5 MG tablet Take 1 tablet (5 mg total) by mouth daily. Patient taking differently: Take 10 mg by mouth daily.  12/04/17 12/04/18 Yes Emokpae, Courage, MD  aspirin EC 81 MG tablet Take 1 tablet (81 mg total) by mouth daily. With food 12/04/17  Yes Emokpae, Courage, MD  calcium acetate (PHOSLO) 667 MG capsule Take 1 capsule (667 mg total) by mouth 3 (three) times daily with meals. 12/04/17  Yes Emokpae, Courage, MD  carvedilol (COREG) 6.25 MG tablet Take 1 tablet (6.25 mg total)  by mouth 2 (two) times daily with a meal. 12/04/17  Yes Emokpae, Courage, MD  clopidogrel (PLAVIX) 75 MG tablet Take 1 tablet (75 mg total) by mouth daily. 12/04/17  Yes Emokpae, Courage, MD  escitalopram (LEXAPRO) 10 MG tablet Take 1 tablet (10 mg total) by mouth daily. 12/04/17  Yes Emokpae, Courage, MD  gabapentin (NEURONTIN) 300 MG capsule Take 1 capsule (300 mg total) by mouth at bedtime. 12/04/17  Yes Emokpae, Courage, MD  insulin lispro (HUMALOG) 100 UNIT/ML injection Inject 0.02 mLs (2 Units total) into the skin 3 (three) times daily before meals. As long as Blood sugar is >  110 12/04/17  Yes  Emokpae, Courage, MD  latanoprost (XALATAN) 0.005 % ophthalmic solution Place 1 drop into both eyes at bedtime. 12/04/17  Yes Emokpae, Courage, MD  multivitamin (RENA-VIT) TABS tablet Take 1 tablet by mouth at bedtime. 12/04/17  Yes Emokpae, Courage, MD  ondansetron (ZOFRAN) 4 MG tablet Take 1 tablet (4 mg total) by mouth every 8 (eight) hours as needed for nausea or vomiting. 12/04/17 12/04/18 Yes Roxan Hockey, MD    Family History Family History  Problem Relation Age of Onset  . Stroke Sister     Social History Social History   Tobacco Use  . Smoking status: Never Smoker  . Smokeless tobacco: Never Used  Substance Use Topics  . Alcohol use: Never    Frequency: Never  . Drug use: Never     Allergies   Adhesive [tape]; Hydrocodone-acetaminophen; and Metformin and related   Review of Systems Review of Systems  Unable to perform ROS: Mental status change     Physical Exam Updated Vital Signs BP (!) 157/84   Pulse 89   Temp (!) 96.6 F (35.9 C) (Rectal)   Resp 15   Ht 5\' 7"  (1.702 m)   Wt 84.8 kg (187 lb)   LMP 10/18/2009 (Within Months) Comment: ablation  SpO2 95%   BMI 29.29 kg/m   Physical Exam  Constitutional: She appears well-developed.  HENT:  Head: Normocephalic and atraumatic.  Eyes: Pupils are equal, round, and reactive to light. EOM are normal.  Neck: Normal range of motion. Neck supple.  Cardiovascular: Normal rate.  Pulmonary/Chest: Effort normal.  Abdominal: Soft. Bowel sounds are normal. There is tenderness.  Left upper quadrant, left lower quadrant and left flank tenderness to palpation. Positive guarding without rebound tenderness.  Abdomen is soft.  Neurological: No cranial nerve deficit. Coordination normal.  Somnolent, but oriented to self, time and location.  Skin: Skin is warm and dry.  Nursing note and vitals reviewed.    ED Treatments / Results  Labs (all labs ordered are listed, but only abnormal results are displayed) Labs  Reviewed  COMPREHENSIVE METABOLIC PANEL - Abnormal; Notable for the following components:      Result Value   Chloride 96 (*)    Glucose, Bld 126 (*)    BUN 31 (*)    Creatinine, Ser 5.03 (*)    Calcium 8.8 (*)    Total Protein 8.6 (*)    AST 12 (*)    Alkaline Phosphatase 129 (*)    Total Bilirubin 1.3 (*)    GFR calc non Af Amer 9 (*)    GFR calc Af Amer 11 (*)    All other components within normal limits  CBC WITH DIFFERENTIAL/PLATELET - Abnormal; Notable for the following components:   Hemoglobin 11.4 (*)    MCH 23.5 (*)    MCHC 29.9 (*)  RDW 18.4 (*)    All other components within normal limits  BASIC METABOLIC PANEL - Abnormal; Notable for the following components:   Chloride 95 (*)    Glucose, Bld 182 (*)    BUN 39 (*)    Creatinine, Ser 5.82 (*)    Calcium 8.4 (*)    GFR calc non Af Amer 8 (*)    GFR calc Af Amer 9 (*)    Anion gap 18 (*)    All other components within normal limits  CBC - Abnormal; Notable for the following components:   Hemoglobin 11.5 (*)    MCH 23.3 (*)    MCHC 29.6 (*)    RDW 18.2 (*)    All other components within normal limits  HEPATIC FUNCTION PANEL - Abnormal; Notable for the following components:   Albumin 3.4 (*)    AST 12 (*)    All other components within normal limits  CULTURE, BLOOD (ROUTINE X 2)  CULTURE, BLOOD (ROUTINE X 2)  PROTIME-INR  TROPONIN I  URINALYSIS, ROUTINE W REFLEX MICROSCOPIC  TROPONIN I  TROPONIN I  I-STAT BETA HCG BLOOD, ED ([facility], [facility], [facility] ONLY)  I-STAT CG4 LACTIC ACID, ED    EKG EKG Interpretation  Date/Time:  Tuesday December 22 2017 17:28:57 EDT Ventricular Rate:  105 PR Interval:    QRS Duration: 93 QT Interval:  368 QTC Calculation: 487 R Axis:   73 Text Interpretation:  Sinus tachycardia Consider right atrial enlargement Minimal ST depression, lateral leads Borderline prolonged QT interval No acute changes Confirmed by Varney Biles ([id number]) on 12/22/2017 8:48:44 PM   Radiology Dg Chest 2  View  Result Date: 12/22/2017 CLINICAL DATA:  Chest pain. EXAM: CHEST - 2 VIEW COMPARISON:  Radiographs of December 03, 2017. FINDINGS: Stable cardiomegaly with central pulmonary vascular congestion is noted. No pneumothorax or pleural effusion is noted. No consolidative process is noted. Bony thorax is unremarkable. IMPRESSION: Stable cardiomegaly with central pulmonary vascular congestion. Electronically Signed   By: Marijo Conception, M.D.   On: 12/22/2017 18:44   Ct Head Wo Contrast  Result Date: 12/22/2017 CLINICAL DATA:  52 year old with altered level of consciousness. Dialysis patient. EXAM: CT HEAD WITHOUT CONTRAST TECHNIQUE: Contiguous axial images were obtained from the base of the skull through the vertex without intravenous contrast. COMPARISON:  10/18/2017 FINDINGS: Brain: No evidence for acute hemorrhage, mass lesion, midline shift, hydrocephalus or large infarct. Vascular: Atherosclerotic calcifications involving the bilateral vertebral arteries. Skull: Negative for fracture or focal lesion. Sinuses/Orbits: Mucosal disease in the maxillary sinuses. Other: None. IMPRESSION: No acute intracranial abnormality. Electronically Signed   By: Markus Daft M.D.   On: 12/22/2017 20:40   Ct Abdomen Pelvis W Contrast  Result Date: 12/22/2017 CLINICAL DATA:  Left-sided flank pain EXAM: CT ABDOMEN AND PELVIS WITH CONTRAST TECHNIQUE: Multidetector CT imaging of the abdomen and pelvis was performed using the standard protocol following bolus administration of intravenous contrast. CONTRAST:  185mL OMNIPAQUE IOHEXOL 300 MG/ML  SOLN COMPARISON:  10/18/2017 FINDINGS: Lower chest: No acute abnormality. Hepatobiliary: Multiple gallstones are noted without complicating factors. The liver is within normal limits. Pancreas: Unremarkable. No pancreatic ductal dilatation or surrounding inflammatory changes. Spleen: Normal in size without focal abnormality. Adrenals/Urinary Tract: Adrenal glands are within normal limits.  Kidneys demonstrate no renal calculi or urinary tract obstructive changes. Mild renal vascular calcifications are seen. The bladder is well distended. Stomach/Bowel: Stomach is within normal limits. Appendix appears normal. No evidence of bowel wall thickening, distention, or inflammatory  changes. Vascular/Lymphatic: IVC filter is noted in place. No lymphadenopathy is identified. Diffuse vascular calcifications are noted without aneurysmal dilatation. Reproductive: Calcifications in the uterus are noted likely related to fibroid change. No adnexal mass is noted. Other: No abdominal wall hernia or abnormality. No abdominopelvic ascites. Musculoskeletal: No acute bony abnormality is noted. IMPRESSION: Cholelithiasis without complicating factors. Chronic changes as described above.  No acute abnormality noted. Electronically Signed   By: Inez Catalina M.D.   On: 12/22/2017 21:48    Procedures Procedures (including critical care time)  CRITICAL CARE Performed by: Crystol Walpole   Total critical care time: 39 minutes for hypertensive emergency requiring IV antihypertensives.  Critical care time was exclusive of separately billable procedures and treating other patients.  Critical care was necessary to treat or prevent imminent or life-threatening deterioration.  Critical care was time spent personally by me on the following activities: development of treatment plan with patient and/or surrogate as well as nursing, discussions with consultants, evaluation of patient's response to treatment, examination of patient, obtaining history from patient or surrogate, ordering and performing treatments and interventions, ordering and review of laboratory studies, ordering and review of radiographic studies, pulse oximetry and re-evaluation of patient's condition.   Medications Ordered in ED Medications  amLODipine (NORVASC) tablet 10 mg (has no administration in time range)  albuterol (PROVENTIL) (2.5 MG/3ML)  0.083% nebulizer solution 2.5 mg (has no administration in time range)  aspirin EC tablet 81 mg (has no administration in time range)  calcium acetate (PHOSLO) capsule 667 mg (has no administration in time range)  carvedilol (COREG) tablet 6.25 mg (has no administration in time range)  clopidogrel (PLAVIX) tablet 75 mg (has no administration in time range)  escitalopram (LEXAPRO) tablet 10 mg (has no administration in time range)  gabapentin (NEURONTIN) capsule 300 mg (300 mg Oral Given 12/23/17 0248)  latanoprost (XALATAN) 0.005 % ophthalmic solution 1 drop (has no administration in time range)  multivitamin (RENA-VIT) tablet 1 tablet (has no administration in time range)  acetaminophen (TYLENOL) tablet 650 mg (has no administration in time range)    Or  acetaminophen (TYLENOL) suppository 650 mg (has no administration in time range)  ondansetron (ZOFRAN) tablet 4 mg (has no administration in time range)    Or  ondansetron (ZOFRAN) injection 4 mg (has no administration in time range)  insulin aspart (novoLOG) injection 0-9 Units (has no administration in time range)  heparin injection 5,000 Units (has no administration in time range)  labetalol (NORMODYNE,TRANDATE) injection 10 mg (10 mg Intravenous Given 12/23/17 0249)  iohexol (OMNIPAQUE) 300 MG/ML solution 100 mL (100 mLs Intravenous Contrast Given 12/22/17 2140)  hydrALAZINE (APRESOLINE) injection 10 mg (10 mg Intravenous Given 12/22/17 2225)  labetalol (NORMODYNE,TRANDATE) injection 20 mg (20 mg Intravenous Given 12/22/17 2313)  ondansetron (ZOFRAN) injection 4 mg (4 mg Intravenous Given 12/22/17 2315)  fentaNYL (SUBLIMAZE) injection 50 mcg (50 mcg Intravenous Given 12/22/17 2313)     Initial Impression / Assessment and Plan / ED Course  I have reviewed the triage vital signs and the nursing notes.  Pertinent labs & imaging results that were available during my care of the patient were reviewed by me and considered in my medical decision  making (see chart for details).     DDx includes: ICH / Stroke ACS Sepsis syndrome Infection - UTI/Pneumonia Encephalopathy  Electrolyte abnormality Toxin mediated process / drug overdose DKA Metabolic disorders including thyroid disorders, adrenal insufficiency  52 year old type I diabetic, dialysis patient comes in with chief complaint  of left-sided abdominal pain.  On exam patient does have left upper quadrant, left lower quadrant and left flank tenderness.  Main concerns are for diverticuliti, although renal infarct, kidney stones and intra-abdominal infection are also possible.  Additionally, patient was sent here by the dialysis center because of altered sensorium.  She was last normal at 7 AM, when she arrived to the dialysis center.  She has no focal neurologic deficits on my exam, however given her risk factors for stroke that is certainly possible.  Medication overdose and brain bleed or other stabilities that we have considered.  Given the abrupt change in mentation, infectious process deemed less likely.  No meningismus appreciated on exam.  3:55 AM Patient CT scan imaging is normal.  Labs overall are reassuring.  Patient's mental status has improved over time, however she does not appear to be at baseline. Suspect that patient is having hypertensive encephalopathy given her severely elevated blood pressure.  Labetalol 20 mg every 10 hours have been ordered to see if he can get patient's map to 100.  Medicine will admit.   Final Clinical Impressions(s) / ED Diagnoses   Final diagnoses:  Hypertensive encephalopathy  Hypertensive emergency  Left lateral abdominal pain    ED Discharge Orders    None       Varney Biles, MD 12/23/17 [phone]

## 2017-12-22 NOTE — ED Triage Notes (Signed)
Pt coming from dialysis via EMS;staff initially called out for AMS, pt not answering questions; pt states that it hurts to talk; pt received full treatment today, but pt has missed several treatments in past couple of weeks d/t problems w/ transportation;pt c/o  L flank pain; staff at dialysis states pt at baseline when she arrived at 70; hx HTN, dm, CHF, renal disease, hyperlipidemia; per EMS, pt normally alert, oriented, ambulatory; stroke screen neg w/ ems  cbg 86 bp 230/120  HR100 sinus 98% RA RR 18

## 2017-12-22 NOTE — ED Notes (Signed)
Patient transported to CT 

## 2017-12-22 NOTE — ED Notes (Signed)
ED Provider at bedside. 

## 2017-12-22 NOTE — Progress Notes (Signed)
Patient not in room. At X-ray at this time. Will return when new consult placed for [facility]. Spoke with unit nurse. Fran Lowes, RN [facility]

## 2017-12-22 NOTE — ED Notes (Signed)
Missed IV attempts by multiple nurses and IV team. MD notified and will attempt ultrasound IV.

## 2017-12-23 ENCOUNTER — Other Ambulatory Visit: Payer: Self-pay

## 2017-12-23 ENCOUNTER — Encounter (HOSPITAL_COMMUNITY): Payer: Self-pay | Admitting: Internal Medicine

## 2017-12-23 DIAGNOSIS — I160 Hypertensive urgency: Secondary | ICD-10-CM | POA: Diagnosis not present

## 2017-12-23 DIAGNOSIS — N186 End stage renal disease: Secondary | ICD-10-CM | POA: Diagnosis not present

## 2017-12-23 DIAGNOSIS — I674 Hypertensive encephalopathy: Secondary | ICD-10-CM | POA: Diagnosis present

## 2017-12-23 DIAGNOSIS — E1121 Type 2 diabetes mellitus with diabetic nephropathy: Secondary | ICD-10-CM | POA: Diagnosis not present

## 2017-12-23 LAB — CBC
HEMATOCRIT: 38.8 % (ref 36.0–46.0)
Hemoglobin: 11.5 g/dL — ABNORMAL LOW (ref 12.0–15.0)
MCH: 23.3 pg — AB (ref 26.0–34.0)
MCHC: 29.6 g/dL — AB (ref 30.0–36.0)
MCV: 78.7 fL (ref 78.0–100.0)
Platelets: 166 10*3/uL (ref 150–400)
RBC: 4.93 MIL/uL (ref 3.87–5.11)
RDW: 18.2 % — ABNORMAL HIGH (ref 11.5–15.5)
WBC: 5.3 10*3/uL (ref 4.0–10.5)

## 2017-12-23 LAB — TROPONIN I
Troponin I: <0.03 ng/mL (ref <0.03)
Troponin I: <0.03 ng/mL (ref <0.03)

## 2017-12-23 LAB — HEPATIC FUNCTION PANEL
ALT: 15 U/L (ref 0–44)
AST: 12 U/L — ABNORMAL LOW (ref 15–41)
Albumin: 3.4 g/dL — ABNORMAL LOW (ref 3.5–5.0)
Alkaline Phosphatase: 113 U/L (ref 38–126)
BILIRUBIN INDIRECT: 0.8 mg/dL (ref 0.3–0.9)
BILIRUBIN TOTAL: 1 mg/dL (ref 0.3–1.2)
Bilirubin, Direct: 0.2 mg/dL (ref 0.0–0.2)
Total Protein: 7.9 g/dL (ref 6.5–8.1)

## 2017-12-23 LAB — BASIC METABOLIC PANEL
ANION GAP: 18 — AB (ref 5–15)
BUN: 39 mg/dL — ABNORMAL HIGH (ref 6–20)
CO2: 24 mmol/L (ref 22–32)
Calcium: 8.4 mg/dL — ABNORMAL LOW (ref 8.9–10.3)
Chloride: 95 mmol/L — ABNORMAL LOW (ref 98–111)
Creatinine, Ser: 5.82 mg/dL — ABNORMAL HIGH (ref 0.44–1.00)
GFR calc Af Amer: 9 mL/min — ABNORMAL LOW (ref >60)
GFR calc non Af Amer: 8 mL/min — ABNORMAL LOW (ref >60)
Glucose, Bld: 182 mg/dL — ABNORMAL HIGH (ref 70–99)
POTASSIUM: 4.8 mmol/L (ref 3.5–5.1)
Sodium: 137 mmol/L (ref 135–145)

## 2017-12-23 LAB — GLUCOSE, CAPILLARY
Glucose-Capillary: 193 mg/dL — ABNORMAL HIGH (ref 70–99)
Glucose-Capillary: 108 mg/dL — ABNORMAL HIGH (ref 70–99)

## 2017-12-23 LAB — CBG MONITORING, ED: Glucose-Capillary: 146 mg/dL — ABNORMAL HIGH (ref 70–99)

## 2017-12-23 MED ORDER — CLOPIDOGREL BISULFATE 75 MG PO TABS
75.0000 mg | ORAL_TABLET | Freq: Every day | ORAL | Status: DC
  Administered 2017-12-23 – 2017-12-24 (×2): 75 mg via ORAL
  Filled 2017-12-23 (×2): qty 1

## 2017-12-23 MED ORDER — TRAMADOL HCL 50 MG PO TABS
50.0000 mg | ORAL_TABLET | Freq: Four times a day (QID) | ORAL | Status: DC | PRN
  Administered 2017-12-23 – 2017-12-24 (×2): 50 mg via ORAL
  Filled 2017-12-23 (×2): qty 1

## 2017-12-23 MED ORDER — AMLODIPINE BESYLATE 10 MG PO TABS
10.0000 mg | ORAL_TABLET | Freq: Every day | ORAL | Status: DC
  Administered 2017-12-23 – 2017-12-24 (×2): 10 mg via ORAL
  Filled 2017-12-23: qty 1
  Filled 2017-12-23: qty 2
  Filled 2017-12-23: qty 1

## 2017-12-23 MED ORDER — ACETAMINOPHEN 650 MG RE SUPP
650.0000 mg | Freq: Four times a day (QID) | RECTAL | Status: DC | PRN
Start: 2017-12-23 — End: 2017-12-24

## 2017-12-23 MED ORDER — PENTAFLUOROPROP-TETRAFLUOROETH EX AERO
1.0000 | INHALATION_SPRAY | CUTANEOUS | Status: DC | PRN
Start: 2017-12-23 — End: 2017-12-24

## 2017-12-23 MED ORDER — HEPARIN SODIUM (PORCINE) 5000 UNIT/ML IJ SOLN
5000.0000 [IU] | Freq: Three times a day (TID) | INTRAMUSCULAR | Status: DC
  Administered 2017-12-23 – 2017-12-24 (×4): 5000 [IU] via SUBCUTANEOUS
  Filled 2017-12-23 (×4): qty 1

## 2017-12-23 MED ORDER — LATANOPROST 0.005 % OP SOLN
1.0000 [drp] | Freq: Every day | OPHTHALMIC | Status: DC
  Administered 2017-12-23: 1 [drp] via OPHTHALMIC
  Filled 2017-12-23: qty 2.5

## 2017-12-23 MED ORDER — GABAPENTIN 300 MG PO CAPS
300.0000 mg | ORAL_CAPSULE | Freq: Every day | ORAL | Status: DC
  Administered 2017-12-23 (×2): 300 mg via ORAL
  Filled 2017-12-23 (×2): qty 1

## 2017-12-23 MED ORDER — LABETALOL HCL 5 MG/ML IV SOLN
10.0000 mg | INTRAVENOUS | Status: DC | PRN
  Administered 2017-12-23: 10 mg via INTRAVENOUS
  Filled 2017-12-23: qty 4

## 2017-12-23 MED ORDER — HEPARIN SODIUM (PORCINE) 1000 UNIT/ML DIALYSIS: 7000.0000 [IU] | Freq: Once | INTRAMUSCULAR | Status: DC

## 2017-12-23 MED ORDER — SODIUM CHLORIDE 0.9 % IV SOLN
100.0000 mL | INTRAVENOUS | Status: DC | PRN
Start: 2017-12-23 — End: 2017-12-24

## 2017-12-23 MED ORDER — ACETAMINOPHEN 325 MG PO TABS
650.0000 mg | ORAL_TABLET | Freq: Four times a day (QID) | ORAL | Status: DC | PRN
Start: 2017-12-23 — End: 2017-12-24

## 2017-12-23 MED ORDER — CHLORHEXIDINE GLUCONATE CLOTH 2 % EX PADS
6.0000 | MEDICATED_PAD | Freq: Every day | CUTANEOUS | Status: DC
  Administered 2017-12-24: 6 via TOPICAL

## 2017-12-23 MED ORDER — RENA-VITE PO TABS
1.0000 | ORAL_TABLET | Freq: Every day | ORAL | Status: DC
  Administered 2017-12-23: 1 via ORAL
  Filled 2017-12-23 (×2): qty 1

## 2017-12-23 MED ORDER — ESCITALOPRAM OXALATE 10 MG PO TABS
10.0000 mg | ORAL_TABLET | Freq: Every day | ORAL | Status: DC
  Administered 2017-12-23 – 2017-12-24 (×2): 10 mg via ORAL
  Filled 2017-12-23 (×3): qty 1

## 2017-12-23 MED ORDER — LIDOCAINE-PRILOCAINE 2.5-2.5 % EX CREA: 1.0000 "application " | TOPICAL_CREAM | CUTANEOUS | Status: DC | PRN

## 2017-12-23 MED ORDER — ALBUTEROL SULFATE (2.5 MG/3ML) 0.083% IN NEBU: 2.5000 mg | INHALATION_SOLUTION | Freq: Four times a day (QID) | RESPIRATORY_TRACT | Status: DC | PRN

## 2017-12-23 MED ORDER — ASPIRIN EC 81 MG PO TBEC
81.0000 mg | DELAYED_RELEASE_TABLET | Freq: Every day | ORAL | Status: DC
  Administered 2017-12-23 – 2017-12-24 (×2): 81 mg via ORAL
  Filled 2017-12-23 (×2): qty 1

## 2017-12-23 MED ORDER — INSULIN ASPART 100 UNIT/ML ~~LOC~~ SOLN
0.0000 [IU] | Freq: Three times a day (TID) | SUBCUTANEOUS | Status: DC
  Administered 2017-12-23: 1 [IU] via SUBCUTANEOUS
  Filled 2017-12-23: qty 1

## 2017-12-23 MED ORDER — CALCIUM ACETATE (PHOS BINDER) 667 MG PO CAPS
667.0000 mg | ORAL_CAPSULE | Freq: Three times a day (TID) | ORAL | Status: DC
  Administered 2017-12-23 – 2017-12-24 (×4): 667 mg via ORAL
  Filled 2017-12-23 (×6): qty 1

## 2017-12-23 MED ORDER — ONDANSETRON HCL 4 MG/2ML IJ SOLN: 4.0000 mg | Freq: Four times a day (QID) | INTRAMUSCULAR | Status: DC | PRN

## 2017-12-23 MED ORDER — CARVEDILOL 6.25 MG PO TABS
6.2500 mg | ORAL_TABLET | Freq: Two times a day (BID) | ORAL | Status: DC
  Administered 2017-12-23 – 2017-12-24 (×4): 6.25 mg via ORAL
  Filled 2017-12-23 (×4): qty 1

## 2017-12-23 MED ORDER — ONDANSETRON HCL 4 MG PO TABS
4.0000 mg | ORAL_TABLET | Freq: Four times a day (QID) | ORAL | Status: DC | PRN
Start: 2017-12-23 — End: 2017-12-24

## 2017-12-23 NOTE — ED Notes (Signed)
Pt aware of need for urine sample, attached to purewick device

## 2017-12-23 NOTE — ED Notes (Signed)
Breakfast ordered 

## 2017-12-23 NOTE — Progress Notes (Signed)
PROGRESS NOTE  Lauren Warner GMW:[id number] DOB: 1965/06/05 DOA: 12/22/2017 PCP: [name], [name]  HPI/Recap of past 24 hours: Lauren Warner is a 52 y.o. female with history of ESRD on hemodialysis on Tuesday Thursday Saturdays, hypertension, CAD, diastolic CHF, anemia was brought to the ER from dialysis center after patient was found to be having mental status changes with left flank pain. Patient reported left flank pain, vomiting and diarrhea for the last 3 days. Denies missing her medications. In the ED, BP noted to be markedly elevated with systolic blood pressure around 226 and had to be given multiple doses of IV labetalol and also pain medication for the left flank pain following which blood pressure improved. CT head is unremarkable. Patient admitted for further management.  Today, patient noted to be more awake, oriented to person place time.  Denies worsening left flank pain, no fever/chills noted, denies any further nausea/vomiting, or diarrhea.   Assessment/Plan: Principal Problem:   Hypertensive urgency Active Problems:   CAD (coronary artery disease)   ESRD (end stage renal disease) (HCC)   Anemia due to end stage renal disease (HCC)   Nausea vomiting and diarrhea   DM (diabetes mellitus), type 2 with renal complications (HCC)   Hypertensive encephalopathy  Hypertensive crisis with encephalopathy Improved On admission, SBP noted to be in the 200s, s/p multiple doses of labetalol CT head unremarkable Troponin x3-, EKG showed minimal ST depression in lat leads, will repeat. Consider cardiology in am No focal neurologic deficit noted Continue home amlodipine, Coreg, PRN IV labetalol Telemetry, monitor closely  Left flank/abdominal pain Unknown etiology, noted with some nausea/vomiting/diarrhea, currently all resolved Afebrile, no leukocytosis CT abdomen unremarkable If persistent will order CT renal stone Nausea/vomiting reoccurs due to gastric emptying  study for gastroparesis  Diabetes mellitus type 2/neuropathy Last A1c 7.5 in 10/2017 SSI, accuchecks Hold home regimen Continue gabapentin   ESRD On HD T/T/S, compliant  Anemia of CKD At baseline Daily CBC  Chronic diastolic heart failure/CAD Echo showed EF of 60 to 65% with grade 2 diastolic dysfunction Continue aspirin, Coreg     Code Status: Full  Family Communication: None at bedside  Disposition Plan: Plan for discharge on 7/25   Consultants:  None  Procedures:  None  Antimicrobials:  None  DVT prophylaxis: Heparin  Objective: Vitals:   12/23/17 0730 12/23/17 0800 12/23/17 0815 12/23/17 1541  BP: (!) 154/79 133/79 (!) 142/74 (!) 146/84  Pulse: 87 81 84 86  Resp: 14 14 13  (!) 25  Temp:    98.5 F (36.9 C)  TempSrc:    Oral  SpO2: 93% 97% 97% 98%  Weight:      Height:        Intake/Output Summary (Last 24 hours) at 12/23/2017 1747 Last data filed at 12/23/2017 1542 Gross per 24 hour  Intake 477 ml  Output -  Net 477 ml   Filed Weights   12/22/17 1807  Weight: 84.8 kg (187 lb)    Exam:   General: NAD  Cardiovascular: S1, S2 present  Respiratory: CTA B  Abdomen: Soft, nontender to palpation, nondistended, bowel sounds present  Musculoskeletal: No pedal edema  Skin: Normal  Psychiatry: Normal mood   Data Reviewed: CBC: Recent Labs  Lab 12/22/17 1804 12/23/17 0128  WBC 4.5 5.3  NEUTROABS 3.3  --   HGB 11.4* 11.5*  HCT 38.1 38.8  MCV 78.4 78.7  PLT 188 440   Basic Metabolic Panel: Recent Labs  Lab 12/22/17 1804 12/23/17 0128  NA 139 137  K 3.9 4.8  CL 96* 95*  CO2 28 24  GLUCOSE 126* 182*  BUN 31* 39*  CREATININE 5.03* 5.82*  CALCIUM 8.8* 8.4*   GFR: Estimated Creatinine Clearance: 12.9 mL/min (A) (by C-G formula based on SCr of 5.82 mg/dL (H)). Liver Function Tests: Recent Labs  Lab 12/22/17 1804 12/23/17 0135  AST 12* 12*  ALT 16 15  ALKPHOS 129* 113  BILITOT 1.3* 1.0  PROT 8.6* 7.9  ALBUMIN  3.9 3.4*   No results for input(s): LIPASE, AMYLASE in the last 168 hours. No results for input(s): AMMONIA in the last 168 hours. Coagulation Profile: Recent Labs  Lab 12/22/17 1804  INR 0.97   Cardiac Enzymes: Recent Labs  Lab 12/23/17 0128 12/23/17 0720 12/23/17 1236  TROPONINI <0.03 <0.03 <0.03   BNP (last 3 results) No results for input(s): PROBNP in the last 8760 hours. HbA1C: No results for input(s): HGBA1C in the last 72 hours. CBG: Recent Labs  Lab 12/23/17 0825 12/23/17 1535  GLUCAP 146* 193*   Lipid Profile: No results for input(s): CHOL, HDL, LDLCALC, TRIG, CHOLHDL, LDLDIRECT in the last 72 hours. Thyroid Function Tests: No results for input(s): TSH, T4TOTAL, FREET4, T3FREE, THYROIDAB in the last 72 hours. Anemia Panel: No results for input(s): VITAMINB12, FOLATE, FERRITIN, TIBC, IRON, RETICCTPCT in the last 72 hours. Urine analysis:    Component Value Date/Time   COLORURINE YELLOW 10/18/2017 2036   APPEARANCEUR CLEAR 10/18/2017 2036   LABSPEC 1.014 10/18/2017 2036   PHURINE 6.0 10/18/2017 2036   GLUCOSEU >=500 (A) 10/18/2017 2036   HGBUR SMALL (A) 10/18/2017 2036   [place] NEGATIVE 10/18/2017 2036   KETONESUR NEGATIVE 10/18/2017 2036   PROTEINUR >=300 (A) 10/18/2017 2036   NITRITE NEGATIVE 10/18/2017 2036   LEUKOCYTESUR NEGATIVE 10/18/2017 2036   Sepsis Labs: @LABRCNTIP (procalcitonin:4,lacticidven:4)  ) Recent Results (from the past 240 hour(s))  Culture, blood (Routine x 2)     Status: None (Preliminary result)   Collection Time: 12/22/17  5:41 PM  Result Value Ref Range Status   Specimen Description BLOOD RIGHT HAND  Final   Special Requests   Final    BOTTLES DRAWN AEROBIC AND ANAEROBIC Blood Culture adequate volume   Culture   Final    NO GROWTH < 24 HOURS Performed at [facility], [place] [address], [place], [place] [address]    Report Status PENDING  Incomplete  Culture, blood (Routine x 2)     Status: None (Preliminary  result)   Collection Time: 12/22/17  9:07 PM  Result Value Ref Range Status   Specimen Description BLOOD RIGHT FOREARM  Final   Special Requests   Final    BOTTLES DRAWN AEROBIC ONLY Blood Culture results may not be optimal due to an inadequate volume of blood received in culture bottles   Culture   Final    NO GROWTH < 24 HOURS Performed at [facility], [place] [address], [place], [place] [address]    Report Status PENDING  Incomplete      Studies: Dg Chest 2 View  Result Date: 12/22/2017 CLINICAL DATA:  Chest pain. EXAM: CHEST - 2 VIEW COMPARISON:  Radiographs of December 03, 2017. FINDINGS: Stable cardiomegaly with central pulmonary vascular congestion is noted. No pneumothorax or pleural effusion is noted. No consolidative process is noted. Bony thorax is unremarkable. IMPRESSION: Stable cardiomegaly with central pulmonary vascular congestion. Electronically Signed   By: Marijo Conception, M.D.   On: 12/22/2017 18:44  Ct Head Wo Contrast  Result Date: 12/22/2017 CLINICAL DATA:  52 year old with altered level of consciousness. Dialysis patient. EXAM: CT HEAD WITHOUT CONTRAST TECHNIQUE: Contiguous axial images were obtained from the base of the skull through the vertex without intravenous contrast. COMPARISON:  10/18/2017 FINDINGS: Brain: No evidence for acute hemorrhage, mass lesion, midline shift, hydrocephalus or large infarct. Vascular: Atherosclerotic calcifications involving the bilateral vertebral arteries. Skull: Negative for fracture or focal lesion. Sinuses/Orbits: Mucosal disease in the maxillary sinuses. Other: None. IMPRESSION: No acute intracranial abnormality. Electronically Signed   By: Markus Daft M.D.   On: 12/22/2017 20:40   Ct Abdomen Pelvis W Contrast  Result Date: 12/22/2017 CLINICAL DATA:  Left-sided flank pain EXAM: CT ABDOMEN AND PELVIS WITH CONTRAST TECHNIQUE: Multidetector CT imaging of the abdomen and pelvis was performed using the standard protocol following bolus  administration of intravenous contrast. CONTRAST:  178mL OMNIPAQUE IOHEXOL 300 MG/ML  SOLN COMPARISON:  10/18/2017 FINDINGS: Lower chest: No acute abnormality. Hepatobiliary: Multiple gallstones are noted without complicating factors. The liver is within normal limits. Pancreas: Unremarkable. No pancreatic ductal dilatation or surrounding inflammatory changes. Spleen: Normal in size without focal abnormality. Adrenals/Urinary Tract: Adrenal glands are within normal limits. Kidneys demonstrate no renal calculi or urinary tract obstructive changes. Mild renal vascular calcifications are seen. The bladder is well distended. Stomach/Bowel: Stomach is within normal limits. Appendix appears normal. No evidence of bowel wall thickening, distention, or inflammatory changes. Vascular/Lymphatic: IVC filter is noted in place. No lymphadenopathy is identified. Diffuse vascular calcifications are noted without aneurysmal dilatation. Reproductive: Calcifications in the uterus are noted likely related to fibroid change. No adnexal mass is noted. Other: No abdominal wall hernia or abnormality. No abdominopelvic ascites. Musculoskeletal: No acute bony abnormality is noted. IMPRESSION: Cholelithiasis without complicating factors. Chronic changes as described above.  No acute abnormality noted. Electronically Signed   By: Inez Catalina M.D.   On: 12/22/2017 21:48    Scheduled Meds: . amLODipine  10 mg Oral Daily  . aspirin EC  81 mg Oral Daily  . calcium acetate  667 mg Oral TID WC  . carvedilol  6.25 mg Oral BID WC  . [START ON 12/24/2017] Chlorhexidine Gluconate Cloth  6 each Topical Q0600  . clopidogrel  75 mg Oral Daily  . escitalopram  10 mg Oral Daily  . gabapentin  300 mg Oral QHS  . heparin  5,000 Units Subcutaneous Q8H  . insulin aspart  0-9 Units Subcutaneous TID WC  . latanoprost  1 drop Both Eyes QHS  . multivitamin  1 tablet Oral QHS    Continuous Infusions:   LOS: 0 days     Alma Friendly,  MD [facility]  If 7PM-7AM, please contact night-coverage [email or web address] Password [facility] 12/23/2017, 5:47 PM

## 2017-12-23 NOTE — H&P (Addendum)
History and Physical    Lauren Warner FIE:[id number] DOB: Mar 16, 1966 DOA: 12/22/2017  PCP: [name], [name]  Patient coming from: Home.  Chief Complaint: Altered mental status and left flank pain.  HPI: Lauren Warner is a 52 y.o. female with history of ESRD on hemodialysis on Tuesday Thursday Saturdays, hypertension, CAD, diastolic CHF, anemia was brought to the ER from dialysis center after patient was found to be having mental status changes with left flank pain.  Patient states he may have passed out.  Not sure.  patient states he has been having left flank pain no nausea vomiting and diarrhea last 3 days.  Denies any blood in the vomitus or diarrhea.  Had a recent admission with similar symptoms of vomiting diarrhea with syncope.  Denies missing her medications.  ED Course: Blood pressure was markedly elevated in the ER with systolic blood pressure around 226 and had to be given multiple doses of IV labetalol and also pain medication for the left flank pain following which blood pressure improved.  Patient appeared nonfocal.  Had complained of headache mostly in the frontal and CT head is unremarkable CT abdomen and pelvis without contrast was unremarkable.  Patient admitted for further management of hypertensive urgency and possible hypertensive encephalopathy and abdominal pain.  Review of Systems: As per HPI, rest all negative.   Past Medical History:  Diagnosis Date  . Anemia   . Anxiety   . Asthma   . CHF (congestive heart failure) ([place])   . Coronary artery disease   . Daily headache   . Depression   . ESRD (end stage renal disease) on dialysis ([place])    "[facility]; TTS" (10/19/2017)  . High cholesterol   . History of blood transfusion 10/19/2017   "anemia"  . Hyperkalemia 10/2017  . Hypertension   . On home oxygen therapy    "2L; daily" (10/19/2017)  . Pneumonia    "several times" (10/19/2017)  . Pulmonary embolism ([place]) 2012  . Type 1 diabetes mellitus  ([place])     Past Surgical History:  Procedure Laterality Date  . AV FISTULA PLACEMENT Left 2018  . CESAREAN SECTION  1989  . CORONARY ANGIOPLASTY WITH STENT PLACEMENT  ~ 08/2017   "3 stents" (10/19/2017)  . ENDOMETRIAL ABLATION  ~ 2011     reports that she has never smoked. She has never used smokeless tobacco. She reports that she does not drink alcohol or use drugs.  Allergies  Allergen Reactions  . Adhesive [Tape] Other (See Comments)    "RIPS MY SKIN," SO PLEASE USE COBAN WRAP!!  . Hydrocodone-Acetaminophen Other (See Comments)    "makes me fell crazy"  . Metformin And Related Other (See Comments)    "Makes me feel crazy"    Family History  Problem Relation Age of Onset  . Stroke Sister     Prior to Admission medications   Medication Sig Start Date End Date Taking? Authorizing Provider  albuterol (PROAIR HFA) 108 (90 Base) MCG/ACT inhaler Inhale 2 puffs into the lungs every 6 (six) hours as needed for wheezing or shortness of breath. 12/04/17  Yes Emokpae, Courage, MD  amLODipine (NORVASC) 5 MG tablet Take 1 tablet (5 mg total) by mouth daily. Patient taking differently: Take 10 mg by mouth daily.  12/04/17 12/04/18 Yes Emokpae, Courage, MD  aspirin EC 81 MG tablet Take 1 tablet (81 mg total) by mouth daily. With food 12/04/17  Yes Emokpae, Courage, MD  calcium acetate (PHOSLO) 667 MG capsule Take  1 capsule (667 mg total) by mouth 3 (three) times daily with meals. 12/04/17  Yes Roxan Hockey, MD  carvedilol (COREG) 6.25 MG tablet Take 1 tablet (6.25 mg total) by mouth 2 (two) times daily with a meal. 12/04/17  Yes Emokpae, Courage, MD  clopidogrel (PLAVIX) 75 MG tablet Take 1 tablet (75 mg total) by mouth daily. 12/04/17  Yes Emokpae, Courage, MD  escitalopram (LEXAPRO) 10 MG tablet Take 1 tablet (10 mg total) by mouth daily. 12/04/17  Yes Emokpae, Courage, MD  gabapentin (NEURONTIN) 300 MG capsule Take 1 capsule (300 mg total) by mouth at bedtime. 12/04/17  Yes Emokpae, Courage, MD  insulin  lispro (HUMALOG) 100 UNIT/ML injection Inject 0.02 mLs (2 Units total) into the skin 3 (three) times daily before meals. As long as Blood sugar is >  110 12/04/17  Yes Emokpae, Courage, MD  latanoprost (XALATAN) 0.005 % ophthalmic solution Place 1 drop into both eyes at bedtime. 12/04/17  Yes Emokpae, Courage, MD  multivitamin (RENA-VIT) TABS tablet Take 1 tablet by mouth at bedtime. 12/04/17  Yes Emokpae, Courage, MD  ondansetron (ZOFRAN) 4 MG tablet Take 1 tablet (4 mg total) by mouth every 8 (eight) hours as needed for nausea or vomiting. 12/04/17 12/04/18 Yes Roxan Hockey, MD    Physical Exam: Vitals:   12/22/17 2330 12/22/17 2345 12/23/17 0000 12/23/17 0015  BP: (!) 159/83 (!) 165/87 (!) 171/91 (!) 168/84  Pulse: 87 88 88 91  Resp: 16 13 15  (!) 24  Temp:      TempSrc:      SpO2: 95% 98% 94% 99%  Weight:      Height:          Constitutional: Moderately built and nourished. Vitals:   12/22/17 2330 12/22/17 2345 12/23/17 0000 12/23/17 0015  BP: (!) 159/83 (!) 165/87 (!) 171/91 (!) 168/84  Pulse: 87 88 88 91  Resp: 16 13 15  (!) 24  Temp:      TempSrc:      SpO2: 95% 98% 94% 99%  Weight:      Height:       Eyes: Anicteric no pallor. ENMT: No discharge from the ears eyes nose or mouth. Neck: No mass or.  No neck rigidity no JVD appreciated. Respiratory: No rhonchi or crepitations. Cardiovascular: S1-S2 heard no murmurs appreciated. Abdomen: Soft nontender bowel sounds present. Musculoskeletal: No edema.  No joint effusion. Skin: No rash.  Skin appears warm. Neurologic: Alert awake oriented to time place and person.  Moves all extremities 5 x 5.  No facial asymmetry.  Tongue is midline pupils equal and reacting to light. Psychiatric: Appears normal.  Normal affect.   Labs on Admission: I have personally reviewed following labs and imaging studies  CBC: Recent Labs  Lab 12/22/17 1804  WBC 4.5  NEUTROABS 3.3  HGB 11.4*  HCT 38.1  MCV 78.4  PLT 244   Basic Metabolic  Panel: Recent Labs  Lab 12/22/17 1804  NA 139  K 3.9  CL 96*  CO2 28  GLUCOSE 126*  BUN 31*  CREATININE 5.03*  CALCIUM 8.8*   GFR: Estimated Creatinine Clearance: 15 mL/min (A) (by C-G formula based on SCr of 5.03 mg/dL (H)). Liver Function Tests: Recent Labs  Lab 12/22/17 1804  AST 12*  ALT 16  ALKPHOS 129*  BILITOT 1.3*  PROT 8.6*  ALBUMIN 3.9   No results for input(s): LIPASE, AMYLASE in the last 168 hours. No results for input(s): AMMONIA in the last 168 hours. Coagulation  Profile: Recent Labs  Lab 12/22/17 1804  INR 0.97   Cardiac Enzymes: No results for input(s): CKTOTAL, CKMB, CKMBINDEX, TROPONINI in the last 168 hours. BNP (last 3 results) No results for input(s): PROBNP in the last 8760 hours. HbA1C: No results for input(s): HGBA1C in the last 72 hours. CBG: No results for input(s): GLUCAP in the last 168 hours. Lipid Profile: No results for input(s): CHOL, HDL, LDLCALC, TRIG, CHOLHDL, LDLDIRECT in the last 72 hours. Thyroid Function Tests: No results for input(s): TSH, T4TOTAL, FREET4, T3FREE, THYROIDAB in the last 72 hours. Anemia Panel: No results for input(s): VITAMINB12, FOLATE, FERRITIN, TIBC, IRON, RETICCTPCT in the last 72 hours. Urine analysis:    Component Value Date/Time   COLORURINE YELLOW 10/18/2017 2036   APPEARANCEUR CLEAR 10/18/2017 2036   LABSPEC 1.014 10/18/2017 2036   PHURINE 6.0 10/18/2017 2036   GLUCOSEU >=500 (A) 10/18/2017 2036   HGBUR SMALL (A) 10/18/2017 2036   BILIRUBINUR NEGATIVE 10/18/2017 2036   KETONESUR NEGATIVE 10/18/2017 2036   PROTEINUR >=300 (A) 10/18/2017 2036   NITRITE NEGATIVE 10/18/2017 2036   LEUKOCYTESUR NEGATIVE 10/18/2017 2036   Sepsis Labs: @LABRCNTIP (procalcitonin:4,lacticidven:4) )No results found for this or any previous visit (from the past 240 hour(s)).   Radiological Exams on Admission: Dg Chest 2 View  Result Date: 12/22/2017 CLINICAL DATA:  Chest pain. EXAM: CHEST - 2 VIEW COMPARISON:   Radiographs of December 03, 2017. FINDINGS: Stable cardiomegaly with central pulmonary vascular congestion is noted. No pneumothorax or pleural effusion is noted. No consolidative process is noted. Bony thorax is unremarkable. IMPRESSION: Stable cardiomegaly with central pulmonary vascular congestion. Electronically Signed   By: Marijo Conception, M.D.   On: 12/22/2017 18:44   Ct Head Wo Contrast  Result Date: 12/22/2017 CLINICAL DATA:  52 year old with altered level of consciousness. Dialysis patient. EXAM: CT HEAD WITHOUT CONTRAST TECHNIQUE: Contiguous axial images were obtained from the base of the skull through the vertex without intravenous contrast. COMPARISON:  10/18/2017 FINDINGS: Brain: No evidence for acute hemorrhage, mass lesion, midline shift, hydrocephalus or large infarct. Vascular: Atherosclerotic calcifications involving the bilateral vertebral arteries. Skull: Negative for fracture or focal lesion. Sinuses/Orbits: Mucosal disease in the maxillary sinuses. Other: None. IMPRESSION: No acute intracranial abnormality. Electronically Signed   By: Markus Daft M.D.   On: 12/22/2017 20:40   Ct Abdomen Pelvis W Contrast  Result Date: 12/22/2017 CLINICAL DATA:  Left-sided flank pain EXAM: CT ABDOMEN AND PELVIS WITH CONTRAST TECHNIQUE: Multidetector CT imaging of the abdomen and pelvis was performed using the standard protocol following bolus administration of intravenous contrast. CONTRAST:  115mL OMNIPAQUE IOHEXOL 300 MG/ML  SOLN COMPARISON:  10/18/2017 FINDINGS: Lower chest: No acute abnormality. Hepatobiliary: Multiple gallstones are noted without complicating factors. The liver is within normal limits. Pancreas: Unremarkable. No pancreatic ductal dilatation or surrounding inflammatory changes. Spleen: Normal in size without focal abnormality. Adrenals/Urinary Tract: Adrenal glands are within normal limits. Kidneys demonstrate no renal calculi or urinary tract obstructive changes. Mild renal vascular  calcifications are seen. The bladder is well distended. Stomach/Bowel: Stomach is within normal limits. Appendix appears normal. No evidence of bowel wall thickening, distention, or inflammatory changes. Vascular/Lymphatic: IVC filter is noted in place. No lymphadenopathy is identified. Diffuse vascular calcifications are noted without aneurysmal dilatation. Reproductive: Calcifications in the uterus are noted likely related to fibroid change. No adnexal mass is noted. Other: No abdominal wall hernia or abnormality. No abdominopelvic ascites. Musculoskeletal: No acute bony abnormality is noted. IMPRESSION: Cholelithiasis without complicating factors. Chronic changes as  described above.  No acute abnormality noted. Electronically Signed   By: Inez Catalina M.D.   On: 12/22/2017 21:48    EKG: Independently reviewed.  Normal sinus rhythm.  Assessment/Plan Principal Problem:   Hypertensive urgency Active Problems:   CAD (coronary artery disease)   ESRD (end stage renal disease) (HCC)   Anemia due to end stage renal disease (HCC)   Nausea vomiting and diarrhea   DM (diabetes mellitus), type 2 with renal complications ([place])   Hypertensive encephalopathy    1. Hypertensive urgency -likely preceded by persistent nausea vomiting.  Blood pressure improved with IV labetalol and pain of the medications.  We will continue home medications amlodipine and Coreg and PRN IV labetalol closely monitor for blood pressure trends and avoid severe drop in blood pressure. 2. Hypertensive encephalopathy improved with blood pressure control.  Appears nonfocal.  CT head is unremarkable. 3. Possible syncope.  Has had 2D echo done in May 3 months ago which showed EF of 60 to 55% with grade 2 diastolic dysfunction.  EKG shows normal sinus rhythm with QTC of 47 ms.  Closely monitor in telemetry. 4. Left flank pain no nausea vomiting and diarrhea.  If there is any further episodes of diarrhea will check stool studies.  CT  abdomen unremarkable.  Closely observe.  Symptoms may be related to possible gastroparesis. 5. Diabetes mellitus type 2 on sliding scale coverage. 6. Anemia likely from ESRD.  Follow CBC.  Hemoglobin appears to be at baseline. 7. ESRD on hemodialysis on Tuesday Thursday and Saturday has had dialysis yesterday. 8. History of CAD denies any chest pain.  On aspirin and Coreg. 9. History of neuropathy on gabapentin.   DVT prophylaxis: SCDs. Code Status: Full code. Family Communication: Discussed with patient. Disposition Plan: Home. Consults called: None. Admission status: Observation.   Rise Patience MD [facility] Pager [phone].  If 7PM-7AM, please contact night-coverage [email or web address] Password [email or web address]  12/23/2017, 1:17 AM

## 2017-12-24 DIAGNOSIS — I160 Hypertensive urgency: Secondary | ICD-10-CM | POA: Diagnosis not present

## 2017-12-24 DIAGNOSIS — E1121 Type 2 diabetes mellitus with diabetic nephropathy: Secondary | ICD-10-CM

## 2017-12-24 DIAGNOSIS — I674 Hypertensive encephalopathy: Secondary | ICD-10-CM

## 2017-12-24 DIAGNOSIS — I2510 Atherosclerotic heart disease of native coronary artery without angina pectoris: Secondary | ICD-10-CM | POA: Diagnosis not present

## 2017-12-24 DIAGNOSIS — D631 Anemia in chronic kidney disease: Secondary | ICD-10-CM

## 2017-12-24 DIAGNOSIS — N186 End stage renal disease: Secondary | ICD-10-CM | POA: Diagnosis not present

## 2017-12-24 DIAGNOSIS — R112 Nausea with vomiting, unspecified: Secondary | ICD-10-CM

## 2017-12-24 DIAGNOSIS — R197 Diarrhea, unspecified: Secondary | ICD-10-CM

## 2017-12-24 LAB — BASIC METABOLIC PANEL
ANION GAP: 13 (ref 5–15)
BUN: 59 mg/dL — ABNORMAL HIGH (ref 6–20)
CHLORIDE: 97 mmol/L — AB (ref 98–111)
CO2: 27 mmol/L (ref 22–32)
Calcium: 7.8 mg/dL — ABNORMAL LOW (ref 8.9–10.3)
Creatinine, Ser: 8.21 mg/dL — ABNORMAL HIGH (ref 0.44–1.00)
GFR calc Af Amer: 6 mL/min — ABNORMAL LOW (ref >60)
GFR calc non Af Amer: 5 mL/min — ABNORMAL LOW (ref >60)
Glucose, Bld: 106 mg/dL — ABNORMAL HIGH (ref 70–99)
Potassium: 5.8 mmol/L — ABNORMAL HIGH (ref 3.5–5.1)
Sodium: 137 mmol/L (ref 135–145)

## 2017-12-24 LAB — CBC WITH DIFFERENTIAL/PLATELET
Abs Immature Granulocytes: 0 10*3/uL (ref 0.0–0.1)
Basophils Absolute: 0 10*3/uL (ref 0.0–0.1)
Basophils Relative: 1 %
EOS PCT: 4 %
Eosinophils Absolute: 0.2 10*3/uL (ref 0.0–0.7)
HEMATOCRIT: 31.2 % — AB (ref 36.0–46.0)
Hemoglobin: 9.4 g/dL — ABNORMAL LOW (ref 12.0–15.0)
Immature Granulocytes: 0 %
LYMPHS ABS: 1.5 10*3/uL (ref 0.7–4.0)
Lymphocytes Relative: 37 %
MCH: 23.9 pg — AB (ref 26.0–34.0)
MCHC: 30.1 g/dL (ref 30.0–36.0)
MCV: 79.4 fL (ref 78.0–100.0)
MONO ABS: 0.4 10*3/uL (ref 0.1–1.0)
MONOS PCT: 11 %
Neutro Abs: 1.9 10*3/uL (ref 1.7–7.7)
Neutrophils Relative %: 47 %
Platelets: 198 10*3/uL (ref 150–400)
RBC: 3.93 MIL/uL (ref 3.87–5.11)
RDW: 18.1 % — ABNORMAL HIGH (ref 11.5–15.5)
WBC: 4 10*3/uL (ref 4.0–10.5)

## 2017-12-24 LAB — GLUCOSE, CAPILLARY
GLUCOSE-CAPILLARY: 98 mg/dL (ref 70–99)
GLUCOSE-CAPILLARY: 118 mg/dL — AB (ref 70–99)

## 2017-12-24 MED ORDER — AMLODIPINE BESYLATE 5 MG PO TABS: 10.0000 mg | ORAL_TABLET | Freq: Every day | ORAL | Status: DC

## 2017-12-24 MED ORDER — AMLODIPINE BESYLATE 10 MG PO TABS: 10.0000 mg | ORAL_TABLET | Freq: Every day | ORAL | 0 refills | Status: DC

## 2017-12-24 NOTE — Procedures (Signed)
   I was present at this dialysis session, have reviewed the session itself and made  appropriate changes Kelly Splinter MD [place] pager [phone]   12/24/2017, 4:11 PM

## 2017-12-24 NOTE — Care Management (Signed)
1356 12-24-17 Pt in need for transportation to home. Patient states no family in town and she is not familiar with the bus system. CM did reach out to the [facility] to see if she can assist with [employer] home. No further needs from CM at this time. Bethena Roys, RN, BSN Case Manager [phone]

## 2017-12-24 NOTE — Progress Notes (Signed)
NURSING PROGRESS NOTE  Lauren Warner [id number] Discharge Data: 12/24/2017 5:53 PM Attending Provider: Alma Friendly, MD KPT:[id number], Brennan Bailey, [name]     [name] to be D/C'd Home per MD order.  Discussed with the patient the After Visit Summary and all questions fully answered. All IV's discontinued with no bleeding noted. All belongings returned to patient for patient to take home. Pt was taken downstairs via WC accompanied by her NT.  Last Vital Signs:  Blood pressure 116/77, pulse 79, temperature 97.6 F (36.4 C), temperature source Oral, resp. rate (!) 32, height 5\' 7"  (1.702 m), weight 94 kg (207 lb 3.7 oz), last menstrual period 10/18/2009, SpO2 100 %.  Discharge Medication List Allergies as of 12/24/2017      Reactions   Adhesive [tape] Other (See Comments)   "RIPS MY SKIN," SO PLEASE USE COBAN WRAP!!   Hydrocodone-acetaminophen Other (See Comments)   "makes me fell crazy"   Metformin And Related Other (See Comments)   "Makes me feel crazy"      Medication List    TAKE these medications   albuterol 108 (90 Base) MCG/ACT inhaler Commonly known as:  PROAIR HFA Inhale 2 puffs into the lungs every 6 (six) hours as needed for wheezing or shortness of breath.   amLODipine 10 MG tablet Commonly known as:  NORVASC Take 1 tablet (10 mg total) by mouth daily. What changed:    medication strength  how much to take   aspirin EC 81 MG tablet Take 1 tablet (81 mg total) by mouth daily. With food   calcium acetate 667 MG capsule Commonly known as:  PHOSLO Take 1 capsule (667 mg total) by mouth 3 (three) times daily with meals.   carvedilol 6.25 MG tablet Commonly known as:  COREG Take 1 tablet (6.25 mg total) by mouth 2 (two) times daily with a meal.   clopidogrel 75 MG tablet Commonly known as:  PLAVIX Take 1 tablet (75 mg total) by mouth daily.   escitalopram 10 MG tablet Commonly known as:  LEXAPRO Take 1 tablet (10 mg total) by mouth daily.    gabapentin 300 MG capsule Commonly known as:  NEURONTIN Take 1 capsule (300 mg total) by mouth at bedtime.   insulin lispro 100 UNIT/ML injection Commonly known as:  HUMALOG Inject 0.02 mLs (2 Units total) into the skin 3 (three) times daily before meals. As long as Blood sugar is >  110   latanoprost 0.005 % ophthalmic solution Commonly known as:  XALATAN Place 1 drop into both eyes at bedtime.   multivitamin Tabs tablet Take 1 tablet by mouth at bedtime.   ondansetron 4 MG tablet Commonly known as:  ZOFRAN Take 1 tablet (4 mg total) by mouth every 8 (eight) hours as needed for nausea or vomiting.

## 2017-12-24 NOTE — Discharge Instructions (Signed)
Dialysis Diet Dialysis is a treatment that cleans your blood. It is used when the kidneys are damaged. When you need dialysis, you should watch your diet. This is because some nutrients can build up in your blood between treatments and make you sick. These nutrients are:  Potassium.  Phosphorus.  Sodium.  Your doctor or dietitian will:  Tell you how much of these you can have.  Tell you if you need to look out for other nutrients too.  Help you plan meals.  Tell you how much to drink each day.  What do I need to know about this diet?  Limit potassium. Potassium is in milk, fruits, and vegetables.  Limit phosphorus. Phosphorus is in milk, cheese, beans, nuts, and carbonated beverages.  Limit salt (sodium). Foods that have a lot sodium include processed and cured meats, ready-made frozen meals, canned vegetables, and salty snack foods.  Do not use salt substitutes.  Try not to eat whole-grain foods and foods that have a lot of fiber.  Follow your doctor's instructions about how much to drink. You may be told to: ? Write down what you drink. ? Write down foods you eat that are made mostly from water, such as gelatin and soups. ? Drink from small cups.  Ask your doctor if you should take a medicine that binds phosphorus.  Take vitamin and mineral supplements only as told by your doctor.  Eat meat, poultry, fish, and eggs. Limit nuts and beans.  Before you cook potatoes, cut them into small pieces. Then boil them in unsalted water.  Drain all fluid from cooked vegetables and canned fruits before you eat them. What foods can I eat? Grains White bread. White rice. Cooked cereal. Unsalted popcorn. Tortillas. Pasta. Vegetables Fresh or frozen broccoli, carrots, and green beans. Cabbage. Cauliflower. Celery. Cucumbers. Eggplant. Radishes. Zucchini. Fruits Apples. Fresh or frozen berries. Fresh or canned pears, peaches, and pineapple. Grapes. Plums. Meats and Other Protein  Sources Fresh or frozen beef, pork, chicken, and fish. Eggs. Low-sodium canned tuna or salmon. Dairy Cream cheese. Heavy cream. Ricotta cheese. Beverages Apple cider. Cranberry juice. Grape juice. Lemonade. Black coffee. Condiments Herbs. Spices. Jam and jelly. Honey. Sweets and Desserts Sherbet. Cakes. Cookies. Fats and Oils Olive oil, canola oil, and safflower oil. Other Non-dairy creamer. Non-dairy whipped topping. Homemade broth without salt. The items listed above may not be a complete list of recommended foods or beverages. Contact your dietitian for more options. What foods are not recommended? Grains Whole-grain bread. Whole-grain pasta. High-fiber cereal. Vegetables Potatoes. Beets. Tomatoes. Winter squash and pumpkin. Asparagus. Spinach. Parsnips. Fruits Star fruit. Bananas. Oranges. Kiwi. Nectarines. Prunes. Melon. Dried fruit. Avocado. Meats and Other Protein Sources Canned, smoked, and cured meats. Soil scientist. Sardines. Nuts and seeds. Peanut butter. Beans and legumes. Dairy Milk. Buttermilk. Yogurt. Cheese and cottage cheese. Processed cheese spreads. Beverages Orange juice. Prune juice. Carbonated soft drinks. Condiments Salt. Salt substitutes. Soy sauce. Sweets and Desserts Ice cream. Chocolate. Candied nuts. Fats and Oils Butter. Margarine. Other Ready-made frozen meals. Canned soups. The items listed above may not be a complete list of foods and beverages to avoid. Contact your dietitian for more information. This information is not intended to replace advice given to you by your health care provider. Make sure you discuss any questions you have with your health care provider. Document Released: 11/18/2011 Document Revised: 10/25/2015 Document Reviewed: 12/20/2013 Elsevier Interactive Patient Education  [employer].

## 2017-12-24 NOTE — Progress Notes (Signed)
Pt a little drowsy after returning from dialysis. Pt is alert and oriented.  VS are stable. Pt states " I'm just tired". Will continue to monitor pt.

## 2017-12-24 NOTE — Progress Notes (Signed)
Pt ambulated on unit accompanied by her NT. Pt with no distress during ambulation and is ready for discharge. Provider made aware.

## 2017-12-24 NOTE — Discharge Summary (Signed)
Discharge Summary  Lauren Warner ZOX:[id number] DOB: [birth year]  PCP: [name], [name]  Admit date: 12/22/2017 Discharge date: 12/24/2017  Time spent: 30 mins   Recommendations for Outpatient Follow-up:  1. PCP 2. HD   Discharge Diagnoses:  Active Hospital Problems   Diagnosis Date Noted  . Hypertensive urgency 12/22/2017  . Hypertensive encephalopathy 12/23/2017  . Nausea vomiting and diarrhea 10/19/2017  . ESRD (end stage renal disease) ([place]) 10/19/2017  . DM (diabetes mellitus), type 2 with renal complications ([place]) 81/19/1478  . Anemia due to end stage renal disease ([place]) 10/19/2017  . CAD (coronary artery disease) 10/19/2017    Resolved Hospital Problems  No resolved problems to display.    Discharge Condition: Stable  Diet recommendation: Renal/heart healty  Vitals:   12/24/17 1209 12/24/17 1245  BP: 132/81 138/80  Pulse: 83   Resp: (!) 32   Temp: 97.6 F (36.4 C)   SpO2: 100%     History of present illness:  Lauren Warner a 52 y.o.femalewithhistory of ESRD on hemodialysis on Tuesday Thursday Saturdays, hypertension, CAD, diastolic CHF, anemia was brought to the ER from dialysis center after patient was found to be having mental status changes with left flank pain. Patient reported left flank pain, vomiting and diarrhea for the last 3 days. Denies missing her medications. In the ED, BP noted to be markedly elevated with systolic blood pressure around 226 and had to be given multiple doses of IV labetalol and also pain medication for the left flank pain following which blood pressure improved. CT head is unremarkable. Patient admitted for further management.  Today, patient noted to be more awake, oriented to person place time.  Denies worsening left flank pain, no fever/chills noted, denies any further nausea/vomiting, or diarrhea. Pt had HD today, stable for discharge.    Hospital Course:  Principal Problem:   Hypertensive urgency Active  Problems:   CAD (coronary artery disease)   ESRD (end stage renal disease) ([facility])   Anemia due to end stage renal disease ([facility])   Nausea vomiting and diarrhea   DM (diabetes mellitus), type 2 with renal complications ([facility])   Hypertensive encephalopathy  Hypertensive crisis with encephalopathy Resolved On admission, SBP noted to be in the 200s, s/p multiple doses of labetalol CT head unremarkable Troponin x3-, EKG unremarkable, chest pain free No focal neurologic deficit noted Continue home amlodipine, Coreg, advised pt for med adherance  Left flank/abdominal pain Unknown etiology, noted with some nausea/vomiting/diarrhea, currently all resolved Afebrile, no leukocytosis CT abdomen unremarkable, no renal stones noted  Diabetes mellitus type 2/neuropathy Last A1c 7.5 in 10/2017 Continue home regimen Continue gabapentin   ESRD On HD T/T/S, compliant Advised against missing session  Anemia of CKD Baseline around 9 At baseline, no signs of bleeding PCP to follow up  Chronic diastolic heart failure/CAD Echo showed EF of 60 to 65% with grade 2 diastolic dysfunction Continue aspirin, Coreg     Procedures:  None  Consultations:  None  Discharge Exam: BP 138/80   Pulse 83   Temp 97.6 F (36.4 C) (Oral)   Resp (!) 32   Ht 5\' 7"  (1.702 m)   Wt 94 kg (207 lb 3.7 oz)   LMP 10/18/2009 (Within Months) Comment: ablation  SpO2 100%   BMI 32.46 kg/m   General: NAD  Cardiovascular: S1, S2 present Respiratory: CTAB   Discharge Instructions You were cared for by a hospitalist during your hospital stay. If you have any questions about your discharge medications or  the care you received while you were in the hospital after you are discharged, you can call the unit and asked to speak with the hospitalist on call if the hospitalist that took care of you is not available. Once you are discharged, your primary care physician will handle any further medical issues. Please  note that NO REFILLS for any discharge medications will be authorized once you are discharged, as it is imperative that you return to your primary care physician (or establish a relationship with a primary care physician if you do not have one) for your aftercare needs so that they can reassess your need for medications and monitor your lab values.   Allergies as of 12/24/2017      Reactions   Adhesive [tape] Other (See Comments)   "RIPS MY SKIN," SO PLEASE USE COBAN WRAP!!   Hydrocodone-acetaminophen Other (See Comments)   "makes me fell crazy"   Metformin And Related Other (See Comments)   "Makes me feel crazy"      Medication List    TAKE these medications   albuterol 108 (90 Base) MCG/ACT inhaler Commonly known as:  PROAIR HFA Inhale 2 puffs into the lungs every 6 (six) hours as needed for wheezing or shortness of breath.   amLODipine 5 MG tablet Commonly known as:  NORVASC Take 2 tablets (10 mg total) by mouth daily.   aspirin EC 81 MG tablet Take 1 tablet (81 mg total) by mouth daily. With food   calcium acetate 667 MG capsule Commonly known as:  PHOSLO Take 1 capsule (667 mg total) by mouth 3 (three) times daily with meals.   carvedilol 6.25 MG tablet Commonly known as:  COREG Take 1 tablet (6.25 mg total) by mouth 2 (two) times daily with a meal.   clopidogrel 75 MG tablet Commonly known as:  PLAVIX Take 1 tablet (75 mg total) by mouth daily.   escitalopram 10 MG tablet Commonly known as:  LEXAPRO Take 1 tablet (10 mg total) by mouth daily.   gabapentin 300 MG capsule Commonly known as:  NEURONTIN Take 1 capsule (300 mg total) by mouth at bedtime.   insulin lispro 100 UNIT/ML injection Commonly known as:  HUMALOG Inject 0.02 mLs (2 Units total) into the skin 3 (three) times daily before meals. As long as Blood sugar is >  110   latanoprost 0.005 % ophthalmic solution Commonly known as:  XALATAN Place 1 drop into both eyes at bedtime.   multivitamin Tabs  tablet Take 1 tablet by mouth at bedtime.   ondansetron 4 MG tablet Commonly known as:  ZOFRAN Take 1 tablet (4 mg total) by mouth every 8 (eight) hours as needed for nausea or vomiting.      Allergies  Allergen Reactions  . Adhesive [Tape] Other (See Comments)    "RIPS MY SKIN," SO PLEASE USE COBAN WRAP!!  . Hydrocodone-Acetaminophen Other (See Comments)    "makes me fell crazy"  . Metformin And Related Other (See Comments)    "Makes me feel crazy"   Follow-up Information    [place], Brennan Bailey, [place]. Schedule an appointment as soon as possible for a visit in 1 week(s).   Specialty:  Internal Medicine Contact information: [address] [place] [address] [phone]            The results of significant diagnostics from this hospitalization (including imaging, microbiology, ancillary and laboratory) are listed below for reference.    Significant Diagnostic Studies: Dg Chest 2  View  Result Date: 12/22/2017 CLINICAL DATA:  Chest pain. EXAM: CHEST - 2 VIEW COMPARISON:  Radiographs of December 03, 2017. FINDINGS: Stable cardiomegaly with central pulmonary vascular congestion is noted. No pneumothorax or pleural effusion is noted. No consolidative process is noted. Bony thorax is unremarkable. IMPRESSION: Stable cardiomegaly with central pulmonary vascular congestion. Electronically Signed   By: Marijo Conception, M.D.   On: 12/22/2017 18:44   Dg Chest 2 View  Result Date: 12/03/2017 CLINICAL DATA:  Syncope. EXAM: CHEST - 2 VIEW COMPARISON:  11/10/2017 FINDINGS: 1351 hours. Stable asymmetric elevation right hemidiaphragm. Cardiopericardial silhouette is at upper limits of normal for size. There is pulmonary vascular congestion without overt pulmonary edema. The visualized bony structures of the thorax are intact. Telemetry leads overlie the chest. IMPRESSION: Borderline cardiomegaly with vascular congestion. Electronically Signed   By: Misty Stanley M.D.   On:  12/03/2017 14:01   Ct Head Wo Contrast  Result Date: 12/22/2017 CLINICAL DATA:  52 year old with altered level of consciousness. Dialysis patient. EXAM: CT HEAD WITHOUT CONTRAST TECHNIQUE: Contiguous axial images were obtained from the base of the skull through the vertex without intravenous contrast. COMPARISON:  10/18/2017 FINDINGS: Brain: No evidence for acute hemorrhage, mass lesion, midline shift, hydrocephalus or large infarct. Vascular: Atherosclerotic calcifications involving the bilateral vertebral arteries. Skull: Negative for fracture or focal lesion. Sinuses/Orbits: Mucosal disease in the maxillary sinuses. Other: None. IMPRESSION: No acute intracranial abnormality. Electronically Signed   By: Markus Daft M.D.   On: 12/22/2017 20:40   Ct Abdomen Pelvis W Contrast  Result Date: 12/22/2017 CLINICAL DATA:  Left-sided flank pain EXAM: CT ABDOMEN AND PELVIS WITH CONTRAST TECHNIQUE: Multidetector CT imaging of the abdomen and pelvis was performed using the standard protocol following bolus administration of intravenous contrast. CONTRAST:  163mL OMNIPAQUE IOHEXOL 300 MG/ML  SOLN COMPARISON:  10/18/2017 FINDINGS: Lower chest: No acute abnormality. Hepatobiliary: Multiple gallstones are noted without complicating factors. The liver is within normal limits. Pancreas: Unremarkable. No pancreatic ductal dilatation or surrounding inflammatory changes. Spleen: Normal in size without focal abnormality. Adrenals/Urinary Tract: Adrenal glands are within normal limits. Kidneys demonstrate no renal calculi or urinary tract obstructive changes. Mild renal vascular calcifications are seen. The bladder is well distended. Stomach/Bowel: Stomach is within normal limits. Appendix appears normal. No evidence of bowel wall thickening, distention, or inflammatory changes. Vascular/Lymphatic: IVC filter is noted in place. No lymphadenopathy is identified. Diffuse vascular calcifications are noted without aneurysmal  dilatation. Reproductive: Calcifications in the uterus are noted likely related to fibroid change. No adnexal mass is noted. Other: No abdominal wall hernia or abnormality. No abdominopelvic ascites. Musculoskeletal: No acute bony abnormality is noted. IMPRESSION: Cholelithiasis without complicating factors. Chronic changes as described above.  No acute abnormality noted. Electronically Signed   By: Inez Catalina M.D.   On: 12/22/2017 21:48    Microbiology: Recent Results (from the past 240 hour(s))  Culture, blood (Routine x 2)     Status: None (Preliminary result)   Collection Time: 12/22/17  5:41 PM  Result Value Ref Range Status   Specimen Description BLOOD RIGHT HAND  Final   Special Requests   Final    BOTTLES DRAWN AEROBIC AND ANAEROBIC Blood Culture adequate volume   Culture   Final    NO GROWTH 2 DAYS Performed at [facility], [address], [place], [place] [address]    Report Status PENDING  Incomplete  Culture, blood (Routine x 2)     Status: None (Preliminary result)  Collection Time: 12/22/17  9:07 PM  Result Value Ref Range Status   Specimen Description BLOOD RIGHT FOREARM  Final   Special Requests   Final    BOTTLES DRAWN AEROBIC ONLY Blood Culture results may not be optimal due to an inadequate volume of blood received in culture bottles   Culture   Final    NO GROWTH 2 DAYS Performed at [facility], [place] [address] [place], [place] [address]    Report Status PENDING  Incomplete     Labs: Basic Metabolic Panel: Recent Labs  Lab 12/22/17 1804 12/23/17 0128 12/24/17 0413  NA 139 137 137  K 3.9 4.8 5.8*  CL 96* 95* 97*  CO2 28 24 27   GLUCOSE 126* 182* 106*  BUN 31* 39* 59*  CREATININE 5.03* 5.82* 8.21*  CALCIUM 8.8* 8.4* 7.8*   Liver Function Tests: Recent Labs  Lab 12/22/17 1804 12/23/17 0135  AST 12* 12*  ALT 16 15  ALKPHOS 129* 113  BILITOT 1.3* 1.0  PROT 8.6* 7.9  ALBUMIN 3.9 3.4*   No results for input(s): LIPASE, AMYLASE in  the last 168 hours. No results for input(s): AMMONIA in the last 168 hours. CBC: Recent Labs  Lab 12/22/17 1804 12/23/17 0128 12/24/17 0413  WBC 4.5 5.3 4.0  NEUTROABS 3.3  --  1.9  HGB 11.4* 11.5* 9.4*  HCT 38.1 38.8 31.2*  MCV 78.4 78.7 79.4  PLT 188 166 198   Cardiac Enzymes: Recent Labs  Lab 12/23/17 0128 12/23/17 0720 12/23/17 1236  TROPONINI <0.03 <0.03 <0.03   BNP: BNP (last 3 results) No results for input(s): BNP in the last 8760 hours.  ProBNP (last 3 results) No results for input(s): PROBNP in the last 8760 hours.  CBG: Recent Labs  Lab 12/23/17 0825 12/23/17 1535 12/23/17 2140 12/24/17 1035 12/24/17 1208  GLUCAP 146* 193* 108* 98 118*       Signed:  Alma Friendly, MD [facility] 12/24/2017, 3:56 PM

## 2017-12-24 NOTE — Plan of Care (Signed)
Pt is C/O some left flank pain and only has Tylenol which she states " Doesn't help", will text page  for something a little stronger and give whatever is ordered and continue to monitor. Pt is pleasant and cooperative but doesn't like to be alone in her room so spent a little extra time with her, nothing said about wanting to hurt herself, just talked a little about her time in [place], will continue to monitor, VSS, no other issues noted at this time.

## 2017-12-25 ENCOUNTER — Encounter (INDEPENDENT_AMBULATORY_CARE_PROVIDER_SITE_OTHER): Payer: Medicaid Other | Admitting: Ophthalmology

## 2017-12-27 LAB — CULTURE, BLOOD (ROUTINE X 2)
CULTURE: NO GROWTH
CULTURE: NO GROWTH
SPECIAL REQUESTS: ADEQUATE

## 2018-01-07 ENCOUNTER — Encounter (HOSPITAL_COMMUNITY): Payer: Self-pay | Admitting: Emergency Medicine

## 2018-01-07 ENCOUNTER — Inpatient Hospital Stay (HOSPITAL_COMMUNITY)
Admission: EM | Admit: 2018-01-07 | Discharge: 2018-01-10 | DRG: 070 | Disposition: A | Discharge status: Home / Self Care | Payer: Medicaid Other | Attending: Internal Medicine | Admitting: Internal Medicine

## 2018-01-07 ENCOUNTER — Emergency Department (HOSPITAL_COMMUNITY): Payer: Medicaid Other

## 2018-01-07 ENCOUNTER — Other Ambulatory Visit: Payer: Self-pay

## 2018-01-07 DIAGNOSIS — Z79899 Other long term (current) drug therapy: Secondary | ICD-10-CM

## 2018-01-07 DIAGNOSIS — E669 Obesity, unspecified: Secondary | ICD-10-CM | POA: Diagnosis present

## 2018-01-07 DIAGNOSIS — Z992 Dependence on renal dialysis: Secondary | ICD-10-CM

## 2018-01-07 DIAGNOSIS — N2581 Secondary hyperparathyroidism of renal origin: Secondary | ICD-10-CM | POA: Diagnosis present

## 2018-01-07 DIAGNOSIS — Z86711 Personal history of pulmonary embolism: Secondary | ICD-10-CM

## 2018-01-07 DIAGNOSIS — M549 Dorsalgia, unspecified: Secondary | ICD-10-CM

## 2018-01-07 DIAGNOSIS — Z888 Allergy status to other drugs, medicaments and biological substances status: Secondary | ICD-10-CM

## 2018-01-07 DIAGNOSIS — I132 Hypertensive heart and chronic kidney disease with heart failure and with stage 5 chronic kidney disease, or end stage renal disease: Secondary | ICD-10-CM | POA: Diagnosis present

## 2018-01-07 DIAGNOSIS — E1129 Type 2 diabetes mellitus with other diabetic kidney complication: Secondary | ICD-10-CM | POA: Diagnosis present

## 2018-01-07 DIAGNOSIS — I160 Hypertensive urgency: Secondary | ICD-10-CM | POA: Diagnosis not present

## 2018-01-07 DIAGNOSIS — Z955 Presence of coronary angioplasty implant and graft: Secondary | ICD-10-CM

## 2018-01-07 DIAGNOSIS — N186 End stage renal disease: Secondary | ICD-10-CM | POA: Diagnosis present

## 2018-01-07 DIAGNOSIS — G9340 Encephalopathy, unspecified: Secondary | ICD-10-CM | POA: Diagnosis not present

## 2018-01-07 DIAGNOSIS — R45851 Suicidal ideations: Secondary | ICD-10-CM | POA: Diagnosis not present

## 2018-01-07 DIAGNOSIS — I2510 Atherosclerotic heart disease of native coronary artery without angina pectoris: Secondary | ICD-10-CM | POA: Diagnosis present

## 2018-01-07 DIAGNOSIS — Z6832 Body mass index (BMI) 32.0-32.9, adult: Secondary | ICD-10-CM

## 2018-01-07 DIAGNOSIS — F419 Anxiety disorder, unspecified: Secondary | ICD-10-CM | POA: Diagnosis present

## 2018-01-07 DIAGNOSIS — F332 Major depressive disorder, recurrent severe without psychotic features: Secondary | ICD-10-CM

## 2018-01-07 DIAGNOSIS — E1121 Type 2 diabetes mellitus with diabetic nephropathy: Secondary | ICD-10-CM | POA: Diagnosis not present

## 2018-01-07 DIAGNOSIS — D649 Anemia, unspecified: Secondary | ICD-10-CM

## 2018-01-07 DIAGNOSIS — Z91048 Other nonmedicinal substance allergy status: Secondary | ICD-10-CM

## 2018-01-07 DIAGNOSIS — Z9115 Patient's noncompliance with renal dialysis: Secondary | ICD-10-CM

## 2018-01-07 DIAGNOSIS — Z23 Encounter for immunization: Secondary | ICD-10-CM

## 2018-01-07 DIAGNOSIS — Z8701 Personal history of pneumonia (recurrent): Secondary | ICD-10-CM

## 2018-01-07 DIAGNOSIS — I150 Renovascular hypertension: Secondary | ICD-10-CM

## 2018-01-07 DIAGNOSIS — Z7982 Long term (current) use of aspirin: Secondary | ICD-10-CM

## 2018-01-07 DIAGNOSIS — F321 Major depressive disorder, single episode, moderate: Secondary | ICD-10-CM | POA: Diagnosis present

## 2018-01-07 DIAGNOSIS — D631 Anemia in chronic kidney disease: Secondary | ICD-10-CM | POA: Diagnosis present

## 2018-01-07 DIAGNOSIS — Z794 Long term (current) use of insulin: Secondary | ICD-10-CM

## 2018-01-07 DIAGNOSIS — J45909 Unspecified asthma, uncomplicated: Secondary | ICD-10-CM | POA: Diagnosis present

## 2018-01-07 DIAGNOSIS — Z630 Problems in relationship with spouse or partner: Secondary | ICD-10-CM

## 2018-01-07 DIAGNOSIS — G9341 Metabolic encephalopathy: Principal | ICD-10-CM | POA: Diagnosis present

## 2018-01-07 DIAGNOSIS — Z823 Family history of stroke: Secondary | ICD-10-CM

## 2018-01-07 DIAGNOSIS — R4182 Altered mental status, unspecified: Secondary | ICD-10-CM

## 2018-01-07 DIAGNOSIS — Z9981 Dependence on supplemental oxygen: Secondary | ICD-10-CM

## 2018-01-07 DIAGNOSIS — Z7902 Long term (current) use of antithrombotics/antiplatelets: Secondary | ICD-10-CM

## 2018-01-07 DIAGNOSIS — Z885 Allergy status to narcotic agent status: Secondary | ICD-10-CM

## 2018-01-07 DIAGNOSIS — E875 Hyperkalemia: Secondary | ICD-10-CM | POA: Diagnosis present

## 2018-01-07 DIAGNOSIS — I5033 Acute on chronic diastolic (congestive) heart failure: Secondary | ICD-10-CM | POA: Diagnosis present

## 2018-01-07 DIAGNOSIS — E1122 Type 2 diabetes mellitus with diabetic chronic kidney disease: Secondary | ICD-10-CM | POA: Diagnosis present

## 2018-01-07 DIAGNOSIS — Z598 Other problems related to housing and economic circumstances: Secondary | ICD-10-CM

## 2018-01-07 DIAGNOSIS — R011 Cardiac murmur, unspecified: Secondary | ICD-10-CM | POA: Diagnosis present

## 2018-01-07 LAB — CBC WITH DIFFERENTIAL/PLATELET
ABS IMMATURE GRANULOCYTES: 0 10*3/uL (ref 0.0–0.1)
BASOS ABS: 0 10*3/uL (ref 0.0–0.1)
Basophils Relative: 1 %
Eosinophils Absolute: 0.2 10*3/uL (ref 0.0–0.7)
Eosinophils Relative: 5 %
HCT: 32.9 % — ABNORMAL LOW (ref 36.0–46.0)
HEMOGLOBIN: 9.7 g/dL — AB (ref 12.0–15.0)
Immature Granulocytes: 0 %
LYMPHS PCT: 26 %
Lymphs Abs: 1.2 10*3/uL (ref 0.7–4.0)
MCH: 23.4 pg — AB (ref 26.0–34.0)
MCHC: 29.5 g/dL — ABNORMAL LOW (ref 30.0–36.0)
MCV: 79.3 fL (ref 78.0–100.0)
MONO ABS: 0.3 10*3/uL (ref 0.1–1.0)
Monocytes Relative: 7 %
NEUTROS ABS: 2.9 10*3/uL (ref 1.7–7.7)
Neutrophils Relative %: 61 %
Platelets: 215 10*3/uL (ref 150–400)
RBC: 4.15 MIL/uL (ref 3.87–5.11)
RDW: 17.5 % — ABNORMAL HIGH (ref 11.5–15.5)
WBC: 4.8 10*3/uL (ref 4.0–10.5)

## 2018-01-07 LAB — I-STAT BETA HCG BLOOD, ED (MC, WL, AP ONLY): HCG, QUANTITATIVE: 6 m[IU]/mL — AB (ref <5)

## 2018-01-07 LAB — ETHANOL: Alcohol, Ethyl (B): <10 mg/dL (ref <10)

## 2018-01-07 LAB — ACETAMINOPHEN LEVEL: Acetaminophen (Tylenol), Serum: <10 ug/mL — ABNORMAL LOW (ref 10–30)

## 2018-01-07 LAB — I-STAT CHEM 8, ED
BUN: 125 mg/dL — ABNORMAL HIGH (ref 6–20)
CALCIUM ION: 0.93 mmol/L — AB (ref 1.15–1.40)
Chloride: 106 mmol/L (ref 98–111)
Creatinine, Ser: 13.2 mg/dL — ABNORMAL HIGH (ref 0.44–1.00)
Glucose, Bld: 178 mg/dL — ABNORMAL HIGH (ref 70–99)
HEMATOCRIT: 30 % — AB (ref 36.0–46.0)
HEMOGLOBIN: 10.2 g/dL — AB (ref 12.0–15.0)
Potassium: 5.8 mmol/L — ABNORMAL HIGH (ref 3.5–5.1)
SODIUM: 138 mmol/L (ref 135–145)
TCO2: 21 mmol/L — ABNORMAL LOW (ref 22–32)

## 2018-01-07 LAB — AMMONIA: Ammonia: 55 umol/L — ABNORMAL HIGH (ref 9–35)

## 2018-01-07 LAB — CBG MONITORING, ED: GLUCOSE-CAPILLARY: 196 mg/dL — AB (ref 70–99)

## 2018-01-07 LAB — PROTIME-INR
INR: 1.03
Prothrombin Time: 13.4 seconds (ref 11.4–15.2)

## 2018-01-07 LAB — APTT: APTT: 35 s (ref 24–36)

## 2018-01-07 LAB — SALICYLATE LEVEL: Salicylate Lvl: <7 mg/dL (ref 2.8–30.0)

## 2018-01-07 LAB — I-STAT CG4 LACTIC ACID, ED: LACTIC ACID, VENOUS: 0.89 mmol/L (ref 0.5–1.9)

## 2018-01-07 MED ORDER — FENTANYL CITRATE (PF) 100 MCG/2ML IJ SOLN
50.0000 ug | Freq: Once | INTRAMUSCULAR | Status: DC
Start: 2018-01-07 — End: 2018-01-07

## 2018-01-07 MED ORDER — ACETAMINOPHEN 325 MG PO TABS
650.0000 mg | ORAL_TABLET | Freq: Four times a day (QID) | ORAL | Status: DC | PRN
  Administered 2018-01-08 – 2018-01-09 (×2): 650 mg via ORAL
  Filled 2018-01-07 (×3): qty 2

## 2018-01-07 MED ORDER — ACETAMINOPHEN 650 MG RE SUPP: 650.0000 mg | Freq: Four times a day (QID) | RECTAL | Status: DC | PRN

## 2018-01-07 MED ORDER — SODIUM CHLORIDE 0.9 % IV SOLN: 100.0000 mL | INTRAVENOUS | Status: DC | PRN

## 2018-01-07 MED ORDER — ONDANSETRON HCL 4 MG/2ML IJ SOLN
4.0000 mg | Freq: Four times a day (QID) | INTRAMUSCULAR | Status: DC | PRN
  Administered 2018-01-08: 4 mg via INTRAVENOUS
  Filled 2018-01-07: qty 2

## 2018-01-07 MED ORDER — DOXERCALCIFEROL 4 MCG/2ML IV SOLN
1.0000 ug | INTRAVENOUS | Status: DC
  Administered 2018-01-09: 1 ug via INTRAVENOUS
  Filled 2018-01-07: qty 2

## 2018-01-07 MED ORDER — ALTEPLASE 2 MG IJ SOLR
2.0000 mg | Freq: Once | INTRAMUSCULAR | Status: DC | PRN
  Filled 2018-01-07: qty 2

## 2018-01-07 MED ORDER — HYDRALAZINE HCL 20 MG/ML IJ SOLN
10.0000 mg | INTRAMUSCULAR | Status: DC | PRN
  Filled 2018-01-07: qty 1

## 2018-01-07 MED ORDER — HYDRALAZINE HCL 20 MG/ML IJ SOLN: 5.0000 mg | Freq: Once | INTRAMUSCULAR | Status: DC

## 2018-01-07 MED ORDER — ONDANSETRON HCL 4 MG PO TABS: 4.0000 mg | ORAL_TABLET | Freq: Four times a day (QID) | ORAL | Status: DC | PRN

## 2018-01-07 MED ORDER — LIDOCAINE HCL (PF) 1 % IJ SOLN
5.0000 mL | INTRAMUSCULAR | Status: DC | PRN
  Filled 2018-01-07: qty 5

## 2018-01-07 MED ORDER — INSULIN ASPART 100 UNIT/ML ~~LOC~~ SOLN
0.0000 [IU] | SUBCUTANEOUS | Status: DC
  Administered 2018-01-08: 2 [IU] via SUBCUTANEOUS
  Administered 2018-01-08: 3 [IU] via SUBCUTANEOUS
  Administered 2018-01-08 (×2): 1 [IU] via SUBCUTANEOUS

## 2018-01-07 MED ORDER — HYDRALAZINE HCL 20 MG/ML IJ SOLN
10.0000 mg | Freq: Once | INTRAMUSCULAR | Status: AC
  Administered 2018-01-08: 10 mg via INTRAVENOUS
  Filled 2018-01-07: qty 1

## 2018-01-07 MED ORDER — CHLORHEXIDINE GLUCONATE CLOTH 2 % EX PADS
6.0000 | MEDICATED_PAD | Freq: Every day | CUTANEOUS | Status: DC
  Administered 2018-01-08: 6 via TOPICAL

## 2018-01-07 MED ORDER — PENTAFLUOROPROP-TETRAFLUOROETH EX AERO: 1.0000 "application " | INHALATION_SPRAY | CUTANEOUS | Status: DC | PRN

## 2018-01-07 MED ORDER — HYDRALAZINE HCL 20 MG/ML IJ SOLN
10.0000 mg | Freq: Once | INTRAMUSCULAR | Status: AC
  Administered 2018-01-07: 10 mg via INTRAVENOUS
  Filled 2018-01-07: qty 1

## 2018-01-07 MED ORDER — LIDOCAINE-PRILOCAINE 2.5-2.5 % EX CREA: 1.0000 "application " | TOPICAL_CREAM | CUTANEOUS | Status: DC | PRN

## 2018-01-07 MED ORDER — LATANOPROST 0.005 % OP SOLN
1.0000 [drp] | Freq: Every day | OPHTHALMIC | Status: DC
  Administered 2018-01-08 – 2018-01-09 (×2): 1 [drp] via OPHTHALMIC
  Filled 2018-01-07: qty 2.5

## 2018-01-07 MED ORDER — LACTULOSE ENEMA
300.0000 mL | Freq: Once | ORAL | Status: AC
  Administered 2018-01-08: 300 mL via RECTAL
  Filled 2018-01-07: qty 300

## 2018-01-07 MED ORDER — HEPARIN SODIUM (PORCINE) 1000 UNIT/ML DIALYSIS
4000.0000 [IU] | Freq: Once | INTRAMUSCULAR | Status: AC
  Administered 2018-01-07: 4000 [IU] via INTRAVENOUS_CENTRAL
  Filled 2018-01-07: qty 4

## 2018-01-07 MED ORDER — HEPARIN SODIUM (PORCINE) 1000 UNIT/ML DIALYSIS
1000.0000 [IU] | INTRAMUSCULAR | Status: DC | PRN
  Filled 2018-01-07: qty 1

## 2018-01-07 MED ORDER — HEPARIN SODIUM (PORCINE) 5000 UNIT/ML IJ SOLN
5000.0000 [IU] | Freq: Three times a day (TID) | INTRAMUSCULAR | Status: DC
  Administered 2018-01-08 – 2018-01-09 (×6): 5000 [IU] via SUBCUTANEOUS
  Filled 2018-01-07 (×6): qty 1

## 2018-01-07 NOTE — Progress Notes (Signed)
HD tx initiated via 15Gx2 w/o problem, pull/push/flush well w/o problem, VSS, will cont to monitor while on HD tx, waiting on sitter to come to bedside

## 2018-01-07 NOTE — ED Notes (Signed)
When RN ask patient if she had SI thoughts patient reports I refused to be a burden to others, I will not live like this I would would rather die.

## 2018-01-07 NOTE — ED Triage Notes (Signed)
Patient presents to ED, Per EMS Dialysis called for a welfare check  Because patient had not had dialysis since last Saturday. EMS arrive patient altered only oriented to self. Patient states she is at home. Patient reports she does not want to live anymore states I am just tired.  When RN ask patient if she was deciding  to not attend dialysis patient responded yes.

## 2018-01-07 NOTE — ED Provider Notes (Addendum)
[facility] EMERGENCY DEPARTMENT Provider Note   CSN: [id number] Arrival date & time: 01/07/18  1431     History   Chief Complaint Chief Complaint  Patient presents with  . Altered Mental Status    HPI Lauren Warner is a 52 y.o. female with a PMHx of CHF, anemia, asthma, CAD, ESRD on dialysis Tu/Th/Sat, HTN, HLD, on home O2, remote PE, DM2, and other conditions listed below, who presents to the ED via EMS after they were called out to her residence for a welfare check and apparently found her confused/altered and reporting suicidal ideations.  LEVEL 5 CAVEAT DUE TO AMS, much of history is limited due to pt uncooperativeness/confusion.  When asked why the patient is here she states "my head hurts" and reports that it began a few days ago.  She recalls that she went to dialysis on Saturday, thinks it is Tuesday but then later states that she did not go to dialysis on Tuesday because she felt tired.  She states "I feel tired of being here, trying to survive".  She states that she feels "like a burden to people".  She states she is choosing to not go to dialysis because she does not want to live.  She reports feeling suicidal with a plan to either walk into something or stop going to dialysis.  Patient is a very difficult historian, mumbles throughout the evaluation, but then answers some questions coherently.  The only other reported complaint is that she has been coughing recently.  She denies fevers or abdominal pain, but she cannot tell me whether she has any other symptoms.  It's unclear whether she has any chest pain or shortness of breath or whether she is had any nausea or vomiting or any other symptoms because she doesn't directly answer these questions, however she also didn't directly report any other symptoms when asked if there was anything else going on.  She denies alcohol or illicit drug use.  She cannot remember when she last took her medications but she says that she  takes them sometimes.  The remainder of the history and ROS are extremely limited due to the patient's confusion/AMS/uncooperativeness.  Of note, chart review reveals that she was recently admitted 7/23-25/19 for AMS due to hypertensive encephalopathy.    The history is provided by medical records and the patient. The history is limited by the condition of the patient. No language interpreter was used.  Altered Mental Status      Past Medical History:  Diagnosis Date  . Anemia   . Anxiety   . Asthma   . CHF (congestive heart failure) ([place])   . Coronary artery disease   . Daily headache   . Depression   . ESRD (end stage renal disease) on dialysis ([place])    "[facility]; TTS" (10/19/2017)  . High cholesterol   . History of blood transfusion 10/19/2017   "anemia"  . Hyperkalemia 10/2017  . Hypertension   . On home oxygen therapy    "2L; daily" (10/19/2017)  . Pneumonia    "several times" (10/19/2017)  . Pulmonary embolism ([place]) 2012  . Type 1 diabetes mellitus [facility])     Patient Active Problem List   Diagnosis Date Noted  . Hypertensive encephalopathy 12/23/2017  . Hypertensive urgency 12/22/2017  . Syncope and collapse 12/03/2017  . Hyperkalemia 11/10/2017  . Pressure injury of skin 10/20/2017  . Essential hypertension 10/19/2017  . CAD (coronary artery disease) 10/19/2017  . ESRD (end stage renal  disease) ([place]) 10/19/2017  . Syncope 10/19/2017  . Anemia due to end stage renal disease ([place]) 10/19/2017  . Closed left ankle fracture 10/19/2017  . Nausea vomiting and diarrhea 10/19/2017  . DM (diabetes mellitus), type 2 with renal complications ([place]) 62/83/1517  . Acute respiratory failure with hypoxia ([place]) 10/18/2017    Past Surgical History:  Procedure Laterality Date  . AV FISTULA PLACEMENT Left 2018  . CESAREAN SECTION  1989  . CORONARY ANGIOPLASTY WITH STENT PLACEMENT  ~ 08/2017   "3 stents" (10/19/2017)  . ENDOMETRIAL ABLATION  ~ 2011     OB History   None       Home Medications    Prior to Admission medications   Medication Sig Start Date End Date Taking? Authorizing Provider  albuterol (PROAIR HFA) 108 (90 Base) MCG/ACT inhaler Inhale 2 puffs into the lungs every 6 (six) hours as needed for wheezing or shortness of breath. 12/04/17   Roxan Hockey, MD  amLODipine (NORVASC) 10 MG tablet Take 1 tablet (10 mg total) by mouth daily. 12/24/17 01/23/18  Alma Friendly, MD  aspirin EC 81 MG tablet Take 1 tablet (81 mg total) by mouth daily. With food 12/04/17   Roxan Hockey, MD  calcium acetate (PHOSLO) 667 MG capsule Take 1 capsule (667 mg total) by mouth 3 (three) times daily with meals. 12/04/17   Roxan Hockey, MD  carvedilol (COREG) 6.25 MG tablet Take 1 tablet (6.25 mg total) by mouth 2 (two) times daily with a meal. 12/04/17   Emokpae, Courage, MD  clopidogrel (PLAVIX) 75 MG tablet Take 1 tablet (75 mg total) by mouth daily. 12/04/17   Roxan Hockey, MD  escitalopram (LEXAPRO) 10 MG tablet Take 1 tablet (10 mg total) by mouth daily. 12/04/17   Roxan Hockey, MD  gabapentin (NEURONTIN) 300 MG capsule Take 1 capsule (300 mg total) by mouth at bedtime. 12/04/17   Roxan Hockey, MD  insulin lispro (HUMALOG) 100 UNIT/ML injection Inject 0.02 mLs (2 Units total) into the skin 3 (three) times daily before meals. As long as Blood sugar is >  110 12/04/17   Emokpae, Courage, MD  latanoprost (XALATAN) 0.005 % ophthalmic solution Place 1 drop into both eyes at bedtime. 12/04/17   Roxan Hockey, MD  multivitamin (RENA-VIT) TABS tablet Take 1 tablet by mouth at bedtime. 12/04/17   Roxan Hockey, MD  ondansetron (ZOFRAN) 4 MG tablet Take 1 tablet (4 mg total) by mouth every 8 (eight) hours as needed for nausea or vomiting. 12/04/17 12/04/18  Roxan Hockey, MD    Family History Family History  Problem Relation Age of Onset  . Stroke Sister     Social History Social History   Tobacco Use  . Smoking status: Never Smoker  . Smokeless tobacco:  Never Used  Substance Use Topics  . Alcohol use: Never    Frequency: Never  . Drug use: Never     Allergies   Adhesive [tape]; Hydrocodone-acetaminophen; and Metformin and related   Review of Systems Review of Systems  Unable to perform ROS: Mental status change  Constitutional: Negative for fever.  Respiratory: Positive for cough.   Gastrointestinal: Negative for abdominal pain.  Allergic/Immunologic: Positive for immunocompromised state (DM2).  Neurological: Positive for headaches.  Psychiatric/Behavioral: Positive for suicidal ideas.   LEVEL 5 CAVEAT DUE TO AMS  Physical Exam Updated Vital Signs BP (!) 188/91   Pulse 83   Temp (!) 97.4 F (36.3 C) (Oral)   Resp 14   Ht 5\' 7"  (  1.702 m)   Wt 94 kg   LMP 10/18/2009 (Within Months) Comment: ablation  SpO2 100%   BMI 32.46 kg/m   Physical Exam  Constitutional: Vital signs are normal. She appears well-developed and well-nourished. She is uncooperative. She is easily aroused.  Non-toxic appearance. No distress.  Afebrile, nontoxic, sleepy but awakens easily, engages throughout most of evaluation but then intermittently closes her eyes and refuses to answer questions. Child-like demeanor. Somewhat uncooperative at times, laying in bed with eyes closed for a lot of the evaluation.  BP 180s/90s  HENT:  Head: Normocephalic and atraumatic.  Mouth/Throat: Oropharynx is clear and moist and mucous membranes are normal.  Eyes: Pupils are equal, round, and reactive to light. Conjunctivae and EOM are normal. Right eye exhibits no discharge. Left eye exhibits no discharge.  PERRL, EOMI, no nystagmus noted although exam limited due to pt uncooperativeness  Neck: Normal range of motion. Neck supple. No neck rigidity. Normal range of motion present.  No meningismus  Cardiovascular: Normal rate, regular rhythm and intact distal pulses. Exam reveals no gallop and no friction rub.  Murmur heard. RRR, nl G1/W2, ?systolic murmur at LSB, no  rubs/gallops appreciated, distal pulses intact, 1+ RLE pedal edema and 2+ LLE pedal edema  Pulmonary/Chest: Effort normal. No respiratory distress. She has decreased breath sounds. She has no wheezes. She has no rhonchi. She has no rales.  Very poor inspiratory effort, pt not very cooperative with exam, perhaps slightly diminished lung sounds in b/l lower fields however hard to determine due to poor inspiratory effort; overall CTAB without any definite w/r/r appreciated although exam limited. No hypoxia or increased WOB, SpO2 100% on RA   Abdominal: Soft. Normal appearance and bowel sounds are normal. She exhibits no distension. There is no tenderness. There is no rigidity, no rebound, no guarding, no CVA tenderness, no tenderness at McBurney's point and negative Murphy's sign.  Musculoskeletal: Normal range of motion.  MAE x4 Strength and sensation grossly intact in all extremities Distal pulses intact  Neurological: She is easily aroused. She has normal strength. No sensory deficit.  Oriented to person and place, but not quite to time (thinks it's Tuesday in July of 2020, however recalls that she had dialysis on Saturday) Not very cooperative with neurological exam, but no definite focal neuro deficits noted. Pt answers questions intermittently but then will mumble and later will refuse/stop answering questions. Follows most commands, but again will intermittently stop cooperating.   Skin: Skin is warm, dry and intact. No rash noted.  Psychiatric: She exhibits a depressed mood. She expresses suicidal ideation. She expresses suicidal plans.  Depressed affect, at times doesn't answer questions and then goes back to answering questions and following commands. Sometimes will lay in the bed with her eyes closed, not responding, but then will open her eyes and continue a coherent conversation. Reports SI with a plan.   Nursing note and vitals reviewed.    ED Treatments / Results  Labs (all labs  ordered are listed, but only abnormal results are displayed) Labs Reviewed  CBC WITH DIFFERENTIAL/PLATELET - Abnormal; Notable for the following components:      Result Value   Hemoglobin 9.7 (*)    HCT 32.9 (*)    MCH 23.4 (*)    MCHC 29.5 (*)    RDW 17.5 (*)    All other components within normal limits  AMMONIA - Abnormal; Notable for the following components:   Ammonia 55 (*)    All other components within  normal limits  ACETAMINOPHEN LEVEL - Abnormal; Notable for the following components:   Acetaminophen (Tylenol), Serum <10 (*)    All other components within normal limits  CBG MONITORING, ED - Abnormal; Notable for the following components:   Glucose-Capillary 196 (*)    All other components within normal limits  I-STAT BETA HCG BLOOD, ED (MC, WL, AP ONLY) - Abnormal; Notable for the following components:   I-stat hCG, quantitative 6.0 (*)    All other components within normal limits  CULTURE, BLOOD (ROUTINE X 2)  CULTURE, BLOOD (ROUTINE X 2)  URINE CULTURE  ETHANOL  SALICYLATE LEVEL  URINALYSIS, COMPLETE (UACMP) WITH MICROSCOPIC  BLOOD GAS, VENOUS  RAPID URINE DRUG SCREEN, HOSP PERFORMED  COMPREHENSIVE METABOLIC PANEL  APTT  PROTIME-INR  I-STAT CG4 LACTIC ACID, ED  I-STAT CHEM 8, ED    EKG EKG Interpretation  Date/Time:  Thursday January 07 2018 14:38:58 EDT Ventricular Rate:  84 PR Interval:    QRS Duration: 93 QT Interval:  392 QTC Calculation: 464 R Axis:   41 Text Interpretation:  Sinus rhythm Borderline low voltage, extremity leads No significant change since last tracing Confirmed by Deno Etienne [phone]) on 01/07/2018 3:10:31 PM Also confirmed by Deno Etienne [phone]), editor Shon Hale [phone])  on 01/07/2018 3:33:22 PM   Radiology Ct Head Wo Contrast  Result Date: 01/07/2018 CLINICAL DATA:  Altered mental status. EXAM: CT HEAD WITHOUT CONTRAST TECHNIQUE: Contiguous axial images were obtained from the base of the skull through the vertex without intravenous  contrast. COMPARISON:  CT head dated December 22, 2017. FINDINGS: Brain: No evidence of acute infarction, hemorrhage, hydrocephalus, extra-axial collection or mass lesion/mass effect. Vascular: Calcified atherosclerosis at the skullbase. No hyperdense vessel. Skull: Normal. Negative for fracture or focal lesion. Sinuses/Orbits: No acute finding. Unchanged bilateral maxillary sinus mucosal thickening. Other: None. IMPRESSION: 1.  No acute intracranial abnormality. Electronically Signed   By: Titus Dubin M.D.   On: 01/07/2018 16:03   Dg Chest Port 1 View  Result Date: 01/07/2018 CLINICAL DATA:  AMS EXAM: PORTABLE CHEST 1 VIEW COMPARISON:  12/22/2017 FINDINGS: The heart is enlarged. There are prominent interstitial markings consistent with interstitial edema. No focal consolidations or overt alveolar edema. IMPRESSION: Cardiomegaly and interstitial pulmonary edema. Electronically Signed   By: Nolon Nations M.D.   On: 01/07/2018 15:36    Procedures Procedures (including critical care time)  CRITICAL CARE--needs urgent dialysis, altered mental state Performed by: Reece Agar   Total critical care time: 55 minutes  Critical care time was exclusive of separately billable procedures and treating other patients.  Critical care was necessary to treat or prevent imminent or life-threatening deterioration.  Critical care was time spent personally by me on the following activities: development of treatment plan with patient and/or surrogate as well as nursing, discussions with consultants, evaluation of patient's response to treatment, examination of patient, obtaining history from patient or surrogate, ordering and performing treatments and interventions, ordering and review of laboratory studies, ordering and review of radiographic studies, pulse oximetry and re-evaluation of patient's condition.   Medications Ordered in ED Medications  Chlorhexidine Gluconate Cloth 2 % PADS 6 each (has no  administration in time range)  pentafluoroprop-tetrafluoroeth (GEBAUERS) aerosol 1 application (has no administration in time range)  lidocaine (PF) (XYLOCAINE) 1 % injection 5 mL (has no administration in time range)  lidocaine-prilocaine (EMLA) cream 1 application (has no administration in time range)  0.9 %  sodium chloride infusion (has no administration in time range)  0.9 %  sodium chloride infusion (has no administration in time range)  heparin injection 1,000 Units (has no administration in time range)  alteplase (CATHFLO ACTIVASE) injection 2 mg (has no administration in time range)  heparin injection 4,000 Units (has no administration in time range)  doxercalciferol (HECTOROL) injection 1 mcg (has no administration in time range)  fentaNYL (SUBLIMAZE) injection 50 mcg (has no administration in time range)  hydrALAZINE (APRESOLINE) injection 10 mg (has no administration in time range)  hydrALAZINE (APRESOLINE) injection 10 mg (10 mg Intravenous Given 01/07/18 1609)     Initial Impression / Assessment and Plan / ED Course  I have reviewed the triage vital signs and the nursing notes.  Pertinent labs & imaging results that were available during my care of the patient were reviewed by me and considered in my medical decision making (see chart for details).     52 y.o. female here after welfare check was done and pt was found in her house, apparently confused. Level 5 caveat applies due to AMS/refusal to answer questions. Pt reports she wants to die and doesn't want to go to dialysis because she feels like she's a burden to everyone. She reports that her head hurts but can't give me much else of a story. She's oriented to person and place, but not quite to time (she knows she went to dialysis on Saturday but she thinks it's Tuesday, in July 2020). She has a child like demeanor, and at times she mumbles instead of answering questions; she will intermittently close her eyes and stop talking,  and then go back to answering questions and following commands. I question how much of her behavior is embellished. On exam, no abdominal tenderness, ?systolic murmur heard at LSB, BP 180s/90s, PERRL and EOMI, MAE x4 and symmetric strength, RLE with 1+ edema and LLE with 2+ edema, very depressed affect. Lung sounds without definite rales however pt with very poor inspiratory effort, perhaps slightly diminished lung sounds in the bases but hard to tell. Recent admission for AMS, from 12/22/17-12/24/17, had hypertensive encephalopathy. Will get labs, CT head, CXR, and give hydralazine, and reassess. Discussed case with my attending Dr. Tyrone Nine who agrees with plan.   5:45 PM Unfortunately there were multiple delays in getting this patient's labs, nursing staff working on getting some of them but thus far we have the following results: CBC w/diff with stable anemia. BetaHCG 6.0, likely false positive, has been that way in the past as well; doubt actual pregnancy. Lactic WNL. Ammonia elevated at 55, unclear if this is the cause of her confusion. EtOH level undetectable. EKG without acute ischemic findings. CXR with cardiomegaly and interstitial pulm edema. CT head without acute findings. Remainder of labs pending or needing recollection. Asked nursing staff to please have this happen ASAP. Of note, spoke with nephrologist Dr. Jonnie Finner who agreed that this pt likely needed admission, he reports that she's willing to get dialysis today and he will arrange for this. Pt still somnolent/confused but requesting pain meds. Will give low dose fentanyl and reassess shortly.   7:05 PM Nursing staff states they are waiting on lab to come draw her other blood tests because they couldn't get it. Apparently dialysis is ready for her, but I can't admit her without her CMP at the very least. The delays in this patient's care have been somewhat lengthy and difficult. Pt is resting comfortably, still very somnolent at this point but is  easily aroused and wants something to drink, agrees to getting dialysis  and appears to understand what is being asked/discussed. So far the only other lab value we have is that her Acetaminophen and salicylate levels are WNL. I'm waiting on the CMP, INR, APTT, and VBG-- at the very least I need the CMP to be able to proceed with admission and get this pt to dialysis. I have informed nursing staff that they need to get this going immediately in order to avoid further delays in this patient's care.   7:27 PM Nursing staff informing me that they can't get any blood currently; at this point, dialysis is ready for her and I would like to avoid further delays in her care. Will call hospitalist for admission, and hopefully while she's at dialysis they can get her CMP done. BP also starting to trend back up, will repeat hydralazine dose.   7:48 PM Dr. Tyrone Nine able to get some blood from pt, sent off for CMP and chem 8, but haven't resulted yet. Dr. Hal Hope of [facility] returning page and will admit. Holding orders to be placed by admitting team. Please see their notes for further documentation of care. I appreciate their help with this pleasant pt's care. Pt stable at time of admission.    Final Clinical Impressions(s) / ED Diagnoses   Final diagnoses:  Altered mental status, unspecified altered mental status type  Renovascular hypertension  Chronic anemia  Suicidal ideation    ED Discharge Orders    [address] [place], [place] 01/07/18 2010    [address] [place] 01/07/18 2014

## 2018-01-07 NOTE — H&P (Signed)
History and Physical    Lauren Warner KGM:[id number] DOB: [birth year] DOA: 01/07/2018  PCP: [name], [name]  Patient coming from: Home.  Chief Complaint: Suicidal.  HPI: Lauren Warner is a 52 y.o. female with history of ESRD on hemodialysis who had not come to dialysis for last 1 week and had welfare check when patient was stating she does not get dialysis due to being and suicidal ideation and kill herself.  Patient was brought to the ER.   Patient was recently admitted multiple times last on 2 weeks ago for hypertensive encephalopathy and hypertensive urgency with left flank pain.  ED Course: In the ER patient is found to be encephalopathic and nephrologist was consulted.  Patient at the time of my exam is in dialysis appears lethargic.  States she has in the low back and very careful.  Does not provide a good history.  Labs noted.  Review of Systems: As per HPI, rest all negative.   Past Medical History:  Diagnosis Date  . Anemia   . Anxiety   . Asthma   . CHF (congestive heart failure) ([place])   . Coronary artery disease   . Daily headache   . Depression   . ESRD (end stage renal disease) on dialysis ([place])    "[facility]; TTS" (10/19/2017)  . High cholesterol   . History of blood transfusion 10/19/2017   "anemia"  . Hyperkalemia 10/2017  . Hypertension   . On home oxygen therapy    "2L; daily" (10/19/2017)  . Pneumonia    "several times" (10/19/2017)  . Pulmonary embolism ([place]) 2012  . Type 1 diabetes mellitus ([place])     Past Surgical History:  Procedure Laterality Date  . AV FISTULA PLACEMENT Left 2018  . CESAREAN SECTION  1989  . CORONARY ANGIOPLASTY WITH STENT PLACEMENT  ~ 08/2017   "3 stents" (10/19/2017)  . ENDOMETRIAL ABLATION  ~ 2011     reports that she has never smoked. She has never used smokeless tobacco. She reports that she does not drink alcohol or use drugs.  Allergies  Allergen Reactions  . Adhesive [Tape] Other (See Comments)    "RIPS  MY SKIN," SO PLEASE USE COBAN WRAP!!  . Hydrocodone-Acetaminophen Other (See Comments)    "Makes me feel crazy"  . Metformin And Related Other (See Comments)    "Makes me feel crazy"    Family History  Problem Relation Age of Onset  . Stroke Sister     Prior to Admission medications   Medication Sig Start Date End Date Taking? Authorizing Provider  albuterol (PROAIR HFA) 108 (90 Base) MCG/ACT inhaler Inhale 2 puffs into the lungs every 6 (six) hours as needed for wheezing or shortness of breath. 12/04/17   Roxan Hockey, MD  amLODipine (NORVASC) 10 MG tablet Take 1 tablet (10 mg total) by mouth daily. 12/24/17 01/23/18  Alma Friendly, MD  aspirin EC 81 MG tablet Take 1 tablet (81 mg total) by mouth daily. With food 12/04/17   Roxan Hockey, MD  calcium acetate (PHOSLO) 667 MG capsule Take 1 capsule (667 mg total) by mouth 3 (three) times daily with meals. 12/04/17   Roxan Hockey, MD  carvedilol (COREG) 6.25 MG tablet Take 1 tablet (6.25 mg total) by mouth 2 (two) times daily with a meal. 12/04/17   Emokpae, Courage, MD  clopidogrel (PLAVIX) 75 MG tablet Take 1 tablet (75 mg total) by mouth daily. 12/04/17   Roxan Hockey, MD  escitalopram (LEXAPRO) 10 MG tablet  Take 1 tablet (10 mg total) by mouth daily. 12/04/17   Roxan Hockey, MD  gabapentin (NEURONTIN) 300 MG capsule Take 1 capsule (300 mg total) by mouth at bedtime. 12/04/17   Roxan Hockey, MD  insulin lispro (HUMALOG) 100 UNIT/ML injection Inject 0.02 mLs (2 Units total) into the skin 3 (three) times daily before meals. As long as Blood sugar is >  110 12/04/17   Emokpae, Courage, MD  latanoprost (XALATAN) 0.005 % ophthalmic solution Place 1 drop into both eyes at bedtime. 12/04/17   Roxan Hockey, MD  multivitamin (RENA-VIT) TABS tablet Take 1 tablet by mouth at bedtime. 12/04/17   Roxan Hockey, MD  ondansetron (ZOFRAN) 4 MG tablet Take 1 tablet (4 mg total) by mouth every 8 (eight) hours as needed for nausea or vomiting.  12/04/17 12/04/18  Roxan Hockey, MD    Physical Exam: Vitals:   01/07/18 2030 01/07/18 2100 01/07/18 2130 01/07/18 2200  BP: (!) 177/75 (!) 161/82 (!) 148/77 (!) 143/75  Pulse: 88 84 80 85  Resp: 20 14 13 16   Temp:      TempSrc:      SpO2: 100% 100% 100% 100%  Weight:      Height:          Constitutional: Moderately built and nourished. Vitals:   01/07/18 2030 01/07/18 2100 01/07/18 2130 01/07/18 2200  BP: (!) 177/75 (!) 161/82 (!) 148/77 (!) 143/75  Pulse: 88 84 80 85  Resp: 20 14 13 16   Temp:      TempSrc:      SpO2: 100% 100% 100% 100%  Weight:      Height:       Eyes: Anicteric no pallor. ENMT: No discharge from the ears eyes nose or mouth. Neck: No mass palpated no neck rigidity. Respiratory: No rhonchi or crepitations. Cardiovascular: S1-S2 heard no murmurs appreciated. Abdomen: Soft nontender bowel sounds present. Musculoskeletal: No edema.  No joint effusion. Skin: No rash. Neurologic: Lethargic tearful moves all extremities follows commands. Psychiatric: Depressed and tearful and suicidal.   Labs on Admission: I have personally reviewed following labs and imaging studies  CBC: Recent Labs  Lab 01/07/18 1612 01/07/18 1955  WBC 4.8  --   NEUTROABS 2.9  --   HGB 9.7* 10.2*  HCT 32.9* 30.0*  MCV 79.3  --   PLT 215  --    Basic Metabolic Panel: Recent Labs  Lab 01/07/18 1955  NA 138  K 5.8*  CL 106  GLUCOSE 178*  BUN 125*  CREATININE 13.20*   GFR: Estimated Creatinine Clearance: 6 mL/min (A) (by C-G formula based on SCr of 13.2 mg/dL (H)). Liver Function Tests: No results for input(s): AST, ALT, ALKPHOS, BILITOT, PROT, ALBUMIN in the last 168 hours. No results for input(s): LIPASE, AMYLASE in the last 168 hours. Recent Labs  Lab 01/07/18 1612  AMMONIA 55*   Coagulation Profile: Recent Labs  Lab 01/07/18 1935  INR 1.03   Cardiac Enzymes: No results for input(s): CKTOTAL, CKMB, CKMBINDEX, TROPONINI in the last 168 hours. BNP (last  3 results) No results for input(s): PROBNP in the last 8760 hours. HbA1C: No results for input(s): HGBA1C in the last 72 hours. CBG: Recent Labs  Lab 01/07/18 1711  GLUCAP 196*   Lipid Profile: No results for input(s): CHOL, HDL, LDLCALC, TRIG, CHOLHDL, LDLDIRECT in the last 72 hours. Thyroid Function Tests: No results for input(s): TSH, T4TOTAL, FREET4, T3FREE, THYROIDAB in the last 72 hours. Anemia Panel: No results for input(s): VITAMINB12,  FOLATE, FERRITIN, TIBC, IRON, RETICCTPCT in the last 72 hours. Urine analysis:    Component Value Date/Time   COLORURINE YELLOW 10/18/2017 2036   APPEARANCEUR CLEAR 10/18/2017 2036   LABSPEC 1.014 10/18/2017 2036   PHURINE 6.0 10/18/2017 2036   GLUCOSEU >=500 (A) 10/18/2017 2036   HGBUR SMALL (A) 10/18/2017 2036   BILIRUBINUR NEGATIVE 10/18/2017 2036   KETONESUR NEGATIVE 10/18/2017 2036   PROTEINUR >=300 (A) 10/18/2017 2036   NITRITE NEGATIVE 10/18/2017 2036   LEUKOCYTESUR NEGATIVE 10/18/2017 2036   Sepsis Labs: @LABRCNTIP (procalcitonin:4,lacticidven:4) )No results found for this or any previous visit (from the past 240 hour(s)).   Radiological Exams on Admission: Ct Head Wo Contrast  Result Date: 01/07/2018 CLINICAL DATA:  Altered mental status. EXAM: CT HEAD WITHOUT CONTRAST TECHNIQUE: Contiguous axial images were obtained from the base of the skull through the vertex without intravenous contrast. COMPARISON:  CT head dated December 22, 2017. FINDINGS: Brain: No evidence of acute infarction, hemorrhage, hydrocephalus, extra-axial collection or mass lesion/mass effect. Vascular: Calcified atherosclerosis at the skullbase. No hyperdense vessel. Skull: Normal. Negative for fracture or focal lesion. Sinuses/Orbits: No acute finding. Unchanged bilateral maxillary sinus mucosal thickening. Other: None. IMPRESSION: 1.  No acute intracranial abnormality. Electronically Signed   By: Titus Dubin M.D.   On: 01/07/2018 16:03   Dg Chest Port 1  View  Result Date: 01/07/2018 CLINICAL DATA:  AMS EXAM: PORTABLE CHEST 1 VIEW COMPARISON:  12/22/2017 FINDINGS: The heart is enlarged. There are prominent interstitial markings consistent with interstitial edema. No focal consolidations or overt alveolar edema. IMPRESSION: Cardiomegaly and interstitial pulmonary edema. Electronically Signed   By: Nolon Nations M.D.   On: 01/07/2018 15:36    EKG: Independently reviewed.  Normal sinus rhythm with nonspecific ST changes.  Assessment/Plan Principal Problem:   Acute encephalopathy Active Problems:   ESRD (end stage renal disease) ([place])   Anemia due to end stage renal disease ([facility])   DM (diabetes mellitus), type 2 with renal complications ([place])   Hypertensive urgency   Suicidal ideation    1. Acute encephalopathy with suicidal ideation -could be uremic.  Ammonia level is mildly elevated for which I have ordered per rectal lactulose.  Recheck ammonia levels after lactulose.  CT head is unremarkable.  Will need psychiatric consult. 2. ESRD on hemodialysis has missed her dialysis for last 1 week with suicidal ideation.  Presently in dialysis.  Dialysis per nephrology. 3. Hypertension -since patient is still lethargic we will keep patient on PRN IV hydralazine.  Oral medications to be started once patient can swallow. 4. Diabetes mellitus -sliding scale for now. 5. Anemia likely from ESRD.  Follow CBC.   DVT prophylaxis: Heparin. Code Status: Full code. Family Communication: Discussed with patient. Disposition Plan: To be determined. Consults called: Nephrology. Admission status: Observation.   Rise Patience MD [facility] Pager [phone].  If 7PM-7AM, please contact night-coverage [email or web address] Password [facility]  01/07/2018, 10:37 PM
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
## 2018-01-08 ENCOUNTER — Observation Stay (HOSPITAL_COMMUNITY): Payer: Medicaid Other

## 2018-01-08 DIAGNOSIS — Z6832 Body mass index (BMI) 32.0-32.9, adult: Secondary | ICD-10-CM | POA: Diagnosis not present

## 2018-01-08 DIAGNOSIS — R4182 Altered mental status, unspecified: Secondary | ICD-10-CM | POA: Diagnosis not present

## 2018-01-08 DIAGNOSIS — E1122 Type 2 diabetes mellitus with diabetic chronic kidney disease: Secondary | ICD-10-CM | POA: Diagnosis present

## 2018-01-08 DIAGNOSIS — I2510 Atherosclerotic heart disease of native coronary artery without angina pectoris: Secondary | ICD-10-CM | POA: Diagnosis present

## 2018-01-08 DIAGNOSIS — E875 Hyperkalemia: Secondary | ICD-10-CM | POA: Diagnosis present

## 2018-01-08 DIAGNOSIS — D631 Anemia in chronic kidney disease: Secondary | ICD-10-CM | POA: Diagnosis present

## 2018-01-08 DIAGNOSIS — Z992 Dependence on renal dialysis: Secondary | ICD-10-CM

## 2018-01-08 DIAGNOSIS — Z598 Other problems related to housing and economic circumstances: Secondary | ICD-10-CM | POA: Diagnosis not present

## 2018-01-08 DIAGNOSIS — N2581 Secondary hyperparathyroidism of renal origin: Secondary | ICD-10-CM | POA: Diagnosis present

## 2018-01-08 DIAGNOSIS — Z9115 Patient's noncompliance with renal dialysis: Secondary | ICD-10-CM

## 2018-01-08 DIAGNOSIS — G9340 Encephalopathy, unspecified: Secondary | ICD-10-CM

## 2018-01-08 DIAGNOSIS — J45909 Unspecified asthma, uncomplicated: Secondary | ICD-10-CM | POA: Diagnosis present

## 2018-01-08 DIAGNOSIS — Z23 Encounter for immunization: Secondary | ICD-10-CM | POA: Diagnosis not present

## 2018-01-08 DIAGNOSIS — Z9981 Dependence on supplemental oxygen: Secondary | ICD-10-CM | POA: Diagnosis not present

## 2018-01-08 DIAGNOSIS — N186 End stage renal disease: Secondary | ICD-10-CM | POA: Diagnosis present

## 2018-01-08 DIAGNOSIS — E669 Obesity, unspecified: Secondary | ICD-10-CM | POA: Diagnosis present

## 2018-01-08 DIAGNOSIS — Z955 Presence of coronary angioplasty implant and graft: Secondary | ICD-10-CM | POA: Diagnosis not present

## 2018-01-08 DIAGNOSIS — F419 Anxiety disorder, unspecified: Secondary | ICD-10-CM | POA: Diagnosis present

## 2018-01-08 DIAGNOSIS — Z79899 Other long term (current) drug therapy: Secondary | ICD-10-CM

## 2018-01-08 DIAGNOSIS — G9341 Metabolic encephalopathy: Secondary | ICD-10-CM | POA: Diagnosis present

## 2018-01-08 DIAGNOSIS — F332 Major depressive disorder, recurrent severe without psychotic features: Secondary | ICD-10-CM

## 2018-01-08 DIAGNOSIS — I5033 Acute on chronic diastolic (congestive) heart failure: Secondary | ICD-10-CM | POA: Diagnosis present

## 2018-01-08 DIAGNOSIS — Z8701 Personal history of pneumonia (recurrent): Secondary | ICD-10-CM | POA: Diagnosis not present

## 2018-01-08 DIAGNOSIS — I132 Hypertensive heart and chronic kidney disease with heart failure and with stage 5 chronic kidney disease, or end stage renal disease: Secondary | ICD-10-CM | POA: Diagnosis present

## 2018-01-08 DIAGNOSIS — R45851 Suicidal ideations: Secondary | ICD-10-CM | POA: Diagnosis present

## 2018-01-08 DIAGNOSIS — R011 Cardiac murmur, unspecified: Secondary | ICD-10-CM | POA: Diagnosis present

## 2018-01-08 DIAGNOSIS — F321 Major depressive disorder, single episode, moderate: Secondary | ICD-10-CM | POA: Diagnosis present

## 2018-01-08 DIAGNOSIS — Z86711 Personal history of pulmonary embolism: Secondary | ICD-10-CM | POA: Diagnosis not present

## 2018-01-08 LAB — CBC WITH DIFFERENTIAL/PLATELET
ABS IMMATURE GRANULOCYTES: 0 10*3/uL (ref 0.0–0.1)
BASOS ABS: 0 10*3/uL (ref 0.0–0.1)
Basophils Relative: 0 %
EOS PCT: 5 %
Eosinophils Absolute: 0.2 10*3/uL (ref 0.0–0.7)
HEMATOCRIT: 35.4 % — AB (ref 36.0–46.0)
HEMOGLOBIN: 10.9 g/dL — AB (ref 12.0–15.0)
Immature Granulocytes: 0 %
LYMPHS ABS: 1 10*3/uL (ref 0.7–4.0)
LYMPHS PCT: 22 %
MCH: 23.6 pg — AB (ref 26.0–34.0)
MCHC: 30.8 g/dL (ref 30.0–36.0)
MCV: 76.8 fL — ABNORMAL LOW (ref 78.0–100.0)
MONO ABS: 0.3 10*3/uL (ref 0.1–1.0)
MONOS PCT: 7 %
NEUTROS ABS: 3 10*3/uL (ref 1.7–7.7)
Neutrophils Relative %: 66 %
Platelets: 204 10*3/uL (ref 150–400)
RBC: 4.61 MIL/uL (ref 3.87–5.11)
RDW: 17.2 % — ABNORMAL HIGH (ref 11.5–15.5)
WBC: 4.6 10*3/uL (ref 4.0–10.5)

## 2018-01-08 LAB — PHOSPHORUS: PHOSPHORUS: 4.4 mg/dL (ref 2.5–4.6)

## 2018-01-08 LAB — URINALYSIS, COMPLETE (UACMP) WITH MICROSCOPIC
Bilirubin Urine: NEGATIVE
KETONES UR: NEGATIVE mg/dL
LEUKOCYTES UA: NEGATIVE
NITRITE: NEGATIVE
Specific Gravity, Urine: 1.011 (ref 1.005–1.030)
pH: 8 (ref 5.0–8.0)

## 2018-01-08 LAB — GLUCOSE, CAPILLARY
Glucose-Capillary: 109 mg/dL — ABNORMAL HIGH (ref 70–99)
Glucose-Capillary: 131 mg/dL — ABNORMAL HIGH (ref 70–99)
Glucose-Capillary: 133 mg/dL — ABNORMAL HIGH (ref 70–99)
Glucose-Capillary: 135 mg/dL — ABNORMAL HIGH (ref 70–99)
Glucose-Capillary: 200 mg/dL — ABNORMAL HIGH (ref 70–99)
Glucose-Capillary: 216 mg/dL — ABNORMAL HIGH (ref 70–99)

## 2018-01-08 LAB — RAPID URINE DRUG SCREEN, HOSP PERFORMED
Amphetamines: NOT DETECTED
BARBITURATES: NOT DETECTED
Benzodiazepines: NOT DETECTED
COCAINE: NOT DETECTED
Opiates: NOT DETECTED
TETRAHYDROCANNABINOL: NOT DETECTED

## 2018-01-08 LAB — BASIC METABOLIC PANEL
ANION GAP: 16 — AB (ref 5–15)
BUN: 39 mg/dL — AB (ref 6–20)
CHLORIDE: 97 mmol/L — AB (ref 98–111)
CO2: 25 mmol/L (ref 22–32)
Calcium: 8.3 mg/dL — ABNORMAL LOW (ref 8.9–10.3)
Creatinine, Ser: 5.77 mg/dL — ABNORMAL HIGH (ref 0.44–1.00)
GFR calc Af Amer: 9 mL/min — ABNORMAL LOW (ref >60)
GFR, EST NON AFRICAN AMERICAN: 8 mL/min — AB (ref >60)
Glucose, Bld: 131 mg/dL — ABNORMAL HIGH (ref 70–99)
POTASSIUM: 3.9 mmol/L (ref 3.5–5.1)
SODIUM: 138 mmol/L (ref 135–145)

## 2018-01-08 LAB — HEPATIC FUNCTION PANEL
ALK PHOS: 93 U/L (ref 38–126)
ALT: 18 U/L (ref 0–44)
AST: 12 U/L — AB (ref 15–41)
Albumin: 3.3 g/dL — ABNORMAL LOW (ref 3.5–5.0)
BILIRUBIN INDIRECT: 0.6 mg/dL (ref 0.3–0.9)
Bilirubin, Direct: 0.1 mg/dL (ref 0.0–0.2)
TOTAL PROTEIN: 7.1 g/dL (ref 6.5–8.1)
Total Bilirubin: 0.7 mg/dL (ref 0.3–1.2)

## 2018-01-08 LAB — TSH: TSH: 2.887 u[IU]/mL (ref 0.350–4.500)

## 2018-01-08 LAB — MRSA PCR SCREENING: MRSA by PCR: NEGATIVE

## 2018-01-08 MED ORDER — GABAPENTIN 300 MG PO CAPS
300.0000 mg | ORAL_CAPSULE | Freq: Once | ORAL | Status: AC
  Administered 2018-01-08: 300 mg via ORAL
  Filled 2018-01-08: qty 1

## 2018-01-08 MED ORDER — HYDRALAZINE HCL 20 MG/ML IJ SOLN
10.0000 mg | Freq: Once | INTRAMUSCULAR | Status: AC
  Administered 2018-01-08: 10 mg via INTRAVENOUS
  Filled 2018-01-08: qty 1

## 2018-01-08 MED ORDER — CHLORHEXIDINE GLUCONATE CLOTH 2 % EX PADS: 6.0000 | MEDICATED_PAD | Freq: Every day | CUTANEOUS | Status: DC

## 2018-01-08 MED ORDER — SEVELAMER CARBONATE 800 MG PO TABS
2400.0000 mg | ORAL_TABLET | Freq: Three times a day (TID) | ORAL | Status: DC
  Administered 2018-01-08 – 2018-01-10 (×6): 2400 mg via ORAL
  Filled 2018-01-08 (×6): qty 3

## 2018-01-08 MED ORDER — RENA-VITE PO TABS
1.0000 | ORAL_TABLET | Freq: Every day | ORAL | Status: DC
  Administered 2018-01-08 – 2018-01-09 (×2): 1 via ORAL
  Filled 2018-01-08 (×2): qty 1

## 2018-01-08 MED ORDER — CLONIDINE HCL 0.2 MG/24HR TD PTWK
0.2000 mg | MEDICATED_PATCH | TRANSDERMAL | Status: DC
Start: 2018-01-08 — End: 2018-01-10
  Administered 2018-01-08: 0.2 mg via TRANSDERMAL
  Filled 2018-01-08: qty 1
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
## 2018-01-08 NOTE — Progress Notes (Addendum)
[facility] Progress Note   Subjective:  Remains lethargic. Resting in bed w/sitter at bedside.  No new complaints.   Objective Vitals:   01/08/18 0340 01/08/18 0500 01/08/18 0513 01/08/18 0605  BP: (!) 199/95  (!) 200/96 134/78  Pulse: 97  93 91  Resp: 15  16 14   Temp: 97.9 F (36.6 C)     TempSrc: Oral     SpO2: 100%  100% 100%  Weight:  97.8 kg    Height:       Physical Exam General:NAD, lethargic, arousable, speaks in low whisper Heart:RRR, no MRG Lungs:CTAB anteriorly Abdomen:soft, NTND Extremities:1+LE edema Dialysis Access: RUE AVF +b/t   Filed Weights   01/07/18 2004 01/08/18 0052 01/08/18 0500  Weight: 94 kg 90 kg 97.8 kg    Intake/Output Summary (Last 24 hours) at 01/08/2018 1111 Last data filed at 01/08/2018 0025 Gross per 24 hour  Intake -  Output 4000 ml  Net -4000 ml    Additional Objective Labs: Basic Metabolic Panel: Recent Labs  Lab 01/07/18 1955 01/08/18 0538  NA 138 138  K 5.8* 3.9  CL 106 97*  CO2  --  25  GLUCOSE 178* 131*  BUN 125* 39*  CREATININE 13.20* 5.77*  CALCIUM  --  8.3*   Liver Function Tests: Recent Labs  Lab 01/08/18 0538  AST 12*  ALT 18  ALKPHOS 93  BILITOT 0.7  PROT 7.1  ALBUMIN 3.3*   CBC: Recent Labs  Lab 01/07/18 1612 01/07/18 1955 01/08/18 0538  WBC 4.8  --  4.6  NEUTROABS 2.9  --  3.0  HGB 9.7* 10.2* 10.9*  HCT 32.9* 30.0* 35.4*  MCV 79.3  --  76.8*  PLT 215  --  204   CBG: Recent Labs  Lab 01/07/18 1711 01/08/18 0337 01/08/18 0606 01/08/18 0750  GLUCAP 196* 133* 135* 109*    Lab Results  Component Value Date   INR 1.03 01/07/2018   INR 0.97 12/22/2017   Studies/Results: Ct Head Wo Contrast  Result Date: 01/07/2018 CLINICAL DATA:  Altered mental status. EXAM: CT HEAD WITHOUT CONTRAST TECHNIQUE: Contiguous axial images were obtained from the base of the skull through the vertex without intravenous contrast. COMPARISON:  CT head dated December 22, 2017. FINDINGS: Brain: No  evidence of acute infarction, hemorrhage, hydrocephalus, extra-axial collection or mass lesion/mass effect. Vascular: Calcified atherosclerosis at the skullbase. No hyperdense vessel. Skull: Normal. Negative for fracture or focal lesion. Sinuses/Orbits: No acute finding. Unchanged bilateral maxillary sinus mucosal thickening. Other: None. IMPRESSION: 1.  No acute intracranial abnormality. Electronically Signed   By: Titus Dubin M.D.   On: 01/07/2018 16:03   Dg Pelvis Portable  Result Date: 01/08/2018 CLINICAL DATA:  Acute onset of lower back pain. EXAM: PORTABLE PELVIS 1-2 VIEWS COMPARISON:  CT of the abdomen and pelvis from 12/22/2017 FINDINGS: There is no evidence of fracture or dislocation. Evaluation is mildly suboptimal due to patient rotation. Both femoral heads are seated normally within their respective acetabula. No significant degenerative change is appreciated. The sacroiliac joints are unremarkable in appearance. The visualized bowel gas pattern is grossly unremarkable in appearance. IMPRESSION: No evidence of fracture or dislocation. Electronically Signed   By: Garald Balding M.D.   On: 01/08/2018 03:35   Dg Chest Port 1 View  Result Date: 01/07/2018 CLINICAL DATA:  AMS EXAM: PORTABLE CHEST 1 VIEW COMPARISON:  12/22/2017 FINDINGS: The heart is enlarged. There are prominent interstitial markings consistent with interstitial edema. No focal consolidations or overt alveolar  edema. IMPRESSION: Cardiomegaly and interstitial pulmonary edema. Electronically Signed   By: Nolon Nations M.D.   On: 01/07/2018 15:36    Medications: . sodium chloride    . sodium chloride     . Chlorhexidine Gluconate Cloth  6 each Topical Q0600  . [START ON 01/09/2018] doxercalciferol  1 mcg Intravenous Q T,Th,Sa-HD  . heparin  5,000 Units Subcutaneous Q8H  . insulin aspart  0-9 Units Subcutaneous Q4H  . latanoprost  1 drop Both Eyes QHS    Dialysis Orders: TTS - AF 4hrs, 350/800, EDW 94.5kg,  2K2.25Ca hectorol 5mcg qHD Hep 4000 unit bolus mircera 125mcg q2wks - last 7/11 RUE AVF  home meds:  - norvasc 10 qd/ coreg 6.25 bid  - humalog 2u tid ac  - phoslo/ alb hfa prn/ ecasa 81/ plavix 75/ mvi/ prn zofran  - neurontin 300 hs/ lexapro 10 mg qd  Assessment/Plan: 1. AMS - likely related to uremia from missing dialysis, labs improved post HD, but continues to be lethargic.  Extra HD today ordered.  Also consider psych related,e.g. Catatonic.  2. Suicidial ideation - needs psych eval and prob family meetings 3. ESRD - TTS HD. Orders written for extra HD today. Will resume regular schedule tomorrow.  4. Anemia of CKD- Hgb 10.9. No indication for ESA at this time. oOntinue to follow.  5. Secondary hyperparathyroidism - Ca 8.3, phos ordered.  Continue VDRA and binders.  6. HTN/volume -  BP elevated. Not taking pills, will start Catapres patch, cont prn IV meds, take home coreg/ norvasc when eating.   7. Nutrition - Alb 3.3. Renal diet w/fluid restrictions. Nepro. Renavite.  8. DM - per primary 9. Depression  Jen Mow, PA-C [facility] Pager: [phone] 01/08/2018,11:11 AM  LOS: 0 days   Pt seen, examined, agree w assess/plan as above with additions as indicated.  Kelly Splinter MD [employer] pager [phone]    cell [phone] 01/08/2018, 11:45 AM

## 2018-01-08 NOTE — Progress Notes (Signed)
PROGRESS NOTE    Lauren Warner  TFT:[id number] DOB: January 30, 1966 DOA: 01/07/2018  PCP: [name], [name]   Brief Narrative:  52 y.o. female with history of ESRD on HD, not compliant with HD, presented with suicidal ideations, altered mental status.  Assessment & Plan:   Principal Problem:   Acute metabolic encephalopathy - Unclear etiology, although possibly from uremia, non-compliance with HD - CT head without acute findings - Continue to monitor  Active Problems:   ESRD (end stage renal disease) ([place]) - On HD - Nephrology following     Anemia due to end stage renal disease ([place]) - Follow up CBC in am    DM (diabetes mellitus), type 2 with renal complications ([facility]) - Continue SSI - CBG's: 133, 135, 109    Accelerated hypertension  - BP 134/78, improved with hydralazine IV    Suicidal ideation - Psych consulted - Safety sitter at bedside    DVT prophylaxis: Heparin subQ Code Status: full code  Family Communication: no family at bedside, sitter at bedside this am Disposition Plan: needs evaluation by psych for safe discharge plan   Consultants:   Psychiatry   Nephrology   Procedures:   HD  Antimicrobials:   None    Subjective: No overnight events.  Objective: Vitals:   01/08/18 0340 01/08/18 0500 01/08/18 0513 01/08/18 0605  BP: (!) 199/95  (!) 200/96 134/78  Pulse: 97  93 91  Resp: 15  16 14   Temp: 97.9 F (36.6 C)     TempSrc: Oral     SpO2: 100%  100% 100%  Weight:  97.8 kg    Height:        Intake/Output Summary (Last 24 hours) at 01/08/2018 1028 Last data filed at 01/08/2018 0025 Gross per 24 hour  Intake -  Output 4000 ml  Net -4000 ml   Filed Weights   01/07/18 2004 01/08/18 0052 01/08/18 0500  Weight: 94 kg 90 kg 97.8 kg    Examination:  General exam: Appears calm and comfortable  Respiratory system: Clear to auscultation. Respiratory effort normal. Cardiovascular system: S1 & S2 heard, RRR. No JVD,  murmurs, Gastrointestinal system: Abdomen is nondistended, soft and nontender. No organomegaly or masses felt. Normal bowel sounds heard. Central nervous system: No focal neurological deficits. Extremities: Symmetric 5 x 5 power. Skin: No rashes, lesions or ulcers Psychiatry: Pt sleeping, not restless   Data Reviewed: I have personally reviewed following labs and imaging studies  CBC: Recent Labs  Lab 01/07/18 1612 01/07/18 1955 01/08/18 0538  WBC 4.8  --  4.6  NEUTROABS 2.9  --  3.0  HGB 9.7* 10.2* 10.9*  HCT 32.9* 30.0* 35.4*  MCV 79.3  --  76.8*  PLT 215  --  706   Basic Metabolic Panel: Recent Labs  Lab 01/07/18 1955 01/08/18 0538  NA 138 138  K 5.8* 3.9  CL 106 97*  CO2  --  25  GLUCOSE 178* 131*  BUN 125* 39*  CREATININE 13.20* 5.77*  CALCIUM  --  8.3*   GFR: Estimated Creatinine Clearance: 14 mL/min (A) (by C-G formula based on SCr of 5.77 mg/dL (H)). Liver Function Tests: Recent Labs  Lab 01/08/18 0538  AST 12*  ALT 18  ALKPHOS 93  BILITOT 0.7  PROT 7.1  ALBUMIN 3.3*   No results for input(s): LIPASE, AMYLASE in the last 168 hours. Recent Labs  Lab 01/07/18 1612  AMMONIA 55*   Coagulation Profile: Recent Labs  Lab 01/07/18 1935  INR 1.03   Cardiac Enzymes: No results for input(s): CKTOTAL, CKMB, CKMBINDEX, TROPONINI in the last 168 hours. BNP (last 3 results) No results for input(s): PROBNP in the last 8760 hours. HbA1C: No results for input(s): HGBA1C in the last 72 hours. CBG: Recent Labs  Lab 01/07/18 1711 01/08/18 0337 01/08/18 0606 01/08/18 0750  GLUCAP 196* 133* 135* 109*   Lipid Profile: No results for input(s): CHOL, HDL, LDLCALC, TRIG, CHOLHDL, LDLDIRECT in the last 72 hours. Thyroid Function Tests: Recent Labs    01/08/18 0538  TSH 2.887   Anemia Panel: No results for input(s): VITAMINB12, FOLATE, FERRITIN, TIBC, IRON, RETICCTPCT in the last 72 hours. Urine analysis:    Component Value Date/Time   COLORURINE  YELLOW 10/18/2017 2036   APPEARANCEUR CLEAR 10/18/2017 2036   LABSPEC 1.014 10/18/2017 2036   PHURINE 6.0 10/18/2017 2036   GLUCOSEU >=500 (A) 10/18/2017 2036   HGBUR SMALL (A) 10/18/2017 2036   BILIRUBINUR NEGATIVE 10/18/2017 2036   KETONESUR NEGATIVE 10/18/2017 2036   PROTEINUR >=300 (A) 10/18/2017 2036   NITRITE NEGATIVE 10/18/2017 2036   LEUKOCYTESUR NEGATIVE 10/18/2017 2036   Sepsis Labs: @LABRCNTIP (procalcitonin:4,lacticidven:4)   MRSA PCR Screening     Status: None   Collection Time: 01/08/18  3:16 AM  Result Value Ref Range Status   MRSA by PCR NEGATIVE NEGATIVE Final      Radiology Studies: Ct Head Wo Contrast Result Date: 01/07/2018 1.  No acute intracranial abnormality.   Dg Pelvis Portable Result Date: 01/08/2018 No evidence of fracture or dislocation.   Dg Chest Port 1 View Result Date: 01/07/2018 Cardiomegaly and interstitial pulmonary edema.    Scheduled Meds: .  doxercalciferol  1 mcg Intravenous Q T,Th,Sa-HD  . heparin  5,000 Units Subcutaneous Q8H  . insulin aspart  0-9 Units Subcutaneous Q4H   Continuous Infusions: . sodium chloride    . sodium chloride       LOS: 0 days    Time spent: 25 minutes Greater than 50% of the time spent on counseling and coordinating the care.   Leisa Lenz, MD [facility] Pager [phone]  If 7PM-7AM, please contact night-coverage [email or web address] Password [email or web address] 01/08/2018, 10:28 AM

## 2018-01-08 NOTE — Progress Notes (Signed)
HD tx completed @ 0025 w/o problem, UF goal met, blood rinsed back, VSS, report called to [place], Therapist, sports, pt left unit w/ sitter to go to room

## 2018-01-09 DIAGNOSIS — F321 Major depressive disorder, single episode, moderate: Secondary | ICD-10-CM

## 2018-01-09 LAB — BASIC METABOLIC PANEL
Anion gap: 12 (ref 5–15)
BUN: 35 mg/dL — AB (ref 6–20)
CO2: 26 mmol/L (ref 22–32)
CREATININE: 5.56 mg/dL — AB (ref 0.44–1.00)
Calcium: 8.5 mg/dL — ABNORMAL LOW (ref 8.9–10.3)
Chloride: 98 mmol/L (ref 98–111)
GFR calc Af Amer: 9 mL/min — ABNORMAL LOW (ref >60)
GFR calc non Af Amer: 8 mL/min — ABNORMAL LOW (ref >60)
GLUCOSE: 109 mg/dL — AB (ref 70–99)
Potassium: 4.9 mmol/L (ref 3.5–5.1)
Sodium: 136 mmol/L (ref 135–145)

## 2018-01-09 LAB — CBC
HEMATOCRIT: 35.4 % — AB (ref 36.0–46.0)
Hemoglobin: 10.6 g/dL — ABNORMAL LOW (ref 12.0–15.0)
MCH: 23.2 pg — ABNORMAL LOW (ref 26.0–34.0)
MCHC: 29.9 g/dL — AB (ref 30.0–36.0)
MCV: 77.5 fL — AB (ref 78.0–100.0)
Platelets: 227 10*3/uL (ref 150–400)
RBC: 4.57 MIL/uL (ref 3.87–5.11)
RDW: 17.5 % — ABNORMAL HIGH (ref 11.5–15.5)
WBC: 4.1 10*3/uL (ref 4.0–10.5)

## 2018-01-09 LAB — URINE CULTURE

## 2018-01-09 LAB — GLUCOSE, CAPILLARY
GLUCOSE-CAPILLARY: 100 mg/dL — AB (ref 70–99)
GLUCOSE-CAPILLARY: 110 mg/dL — AB (ref 70–99)
GLUCOSE-CAPILLARY: 143 mg/dL — AB (ref 70–99)
GLUCOSE-CAPILLARY: 114 mg/dL — AB (ref 70–99)
Glucose-Capillary: 197 mg/dL — ABNORMAL HIGH (ref 70–99)

## 2018-01-09 MED ORDER — PENTAFLUOROPROP-TETRAFLUOROETH EX AERO: 1.0000 "application " | INHALATION_SPRAY | CUTANEOUS | Status: DC | PRN

## 2018-01-09 MED ORDER — ALTEPLASE 2 MG IJ SOLR: 2.0000 mg | Freq: Once | INTRAMUSCULAR | Status: DC | PRN

## 2018-01-09 MED ORDER — CHLORHEXIDINE GLUCONATE CLOTH 2 % EX PADS: 6.0000 | MEDICATED_PAD | Freq: Every day | CUTANEOUS | Status: DC

## 2018-01-09 MED ORDER — DOXERCALCIFEROL 4 MCG/2ML IV SOLN
INTRAVENOUS | Status: AC
  Administered 2018-01-09: 1 ug via INTRAVENOUS
  Filled 2018-01-09: qty 2

## 2018-01-09 MED ORDER — SODIUM CHLORIDE 0.9 % IV SOLN: 100.0000 mL | INTRAVENOUS | Status: DC | PRN

## 2018-01-09 MED ORDER — INSULIN ASPART 100 UNIT/ML ~~LOC~~ SOLN
0.0000 [IU] | Freq: Three times a day (TID) | SUBCUTANEOUS | Status: DC
  Administered 2018-01-09: 2 [IU] via SUBCUTANEOUS
  Administered 2018-01-09 – 2018-01-10 (×2): 1 [IU] via SUBCUTANEOUS
  Administered 2018-01-10: 7 [IU] via SUBCUTANEOUS

## 2018-01-09 MED ORDER — GABAPENTIN 300 MG PO CAPS
300.0000 mg | ORAL_CAPSULE | Freq: Once | ORAL | Status: AC
  Administered 2018-01-09: 300 mg via ORAL
  Filled 2018-01-09: qty 1

## 2018-01-09 MED ORDER — ZOLPIDEM TARTRATE 5 MG PO TABS
5.0000 mg | ORAL_TABLET | Freq: Once | ORAL | Status: AC
  Administered 2018-01-09: 5 mg via ORAL
  Filled 2018-01-09: qty 1

## 2018-01-09 MED ORDER — HEPARIN SODIUM (PORCINE) 1000 UNIT/ML DIALYSIS: 4000.0000 [IU] | INTRAMUSCULAR | Status: DC | PRN

## 2018-01-09 MED ORDER — LIDOCAINE HCL (PF) 1 % IJ SOLN: 5.0000 mL | INTRAMUSCULAR | Status: DC | PRN

## 2018-01-09 MED ORDER — HEPARIN SODIUM (PORCINE) 1000 UNIT/ML DIALYSIS: 1000.0000 [IU] | INTRAMUSCULAR | Status: DC | PRN

## 2018-01-09 MED ORDER — LIDOCAINE-PRILOCAINE 2.5-2.5 % EX CREA: 1.0000 "application " | TOPICAL_CREAM | CUTANEOUS | Status: DC | PRN

## 2018-01-09 NOTE — Progress Notes (Addendum)
[facility] Progress Note   Subjective:   Alert today.  Answers all questions, following commands. No new complaints.   Objective Vitals:   01/09/18 0420 01/09/18 0434 01/09/18 0725 01/09/18 1154  BP: (!) 159/99  132/68   Pulse: 85   85  Resp: 13  19 (!) 23  Temp:      TempSrc:      SpO2: 96%  99% 100%  Weight:  95.8 kg    Height:       Physical Exam General:NAD, obese, chronically ill appearing female Heart:RRR Lungs:CTAB Abdomen:soft, NTND Extremities:no LE edema Dialysis Access: RU AVF +b/t   Filed Weights   01/08/18 1511 01/08/18 1811 01/09/18 0434  Weight: 97 kg 95 kg 95.8 kg    Intake/Output Summary (Last 24 hours) at 01/09/2018 1252 Last data filed at 01/09/2018 0945 Gross per 24 hour  Intake 660 ml  Output 2000 ml  Net -1340 ml    Additional Objective Labs: Basic Metabolic Panel: Recent Labs  Lab 01/07/18 1955 01/08/18 0538 01/09/18 0528  NA 138 138 136  K 5.8* 3.9 4.9  CL 106 97* 98  CO2  --  25 26  GLUCOSE 178* 131* 109*  BUN 125* 39* 35*  CREATININE 13.20* 5.77* 5.56*  CALCIUM  --  8.3* 8.5*  PHOS  --  4.4  --    Liver Function Tests: Recent Labs  Lab 01/08/18 0538  AST 12*  ALT 18  ALKPHOS 93  BILITOT 0.7  PROT 7.1  ALBUMIN 3.3*   CBC: Recent Labs  Lab 01/07/18 1612 01/07/18 1955 01/08/18 0538 01/09/18 0528  WBC 4.8  --  4.6 4.1  NEUTROABS 2.9  --  3.0  --   HGB 9.7* 10.2* 10.9* 10.6*  HCT 32.9* 30.0* 35.4* 35.4*  MCV 79.3  --  76.8* 77.5*  PLT 215  --  204 227   Blood Culture    Component Value Date/Time   SDES URINE, CLEAN CATCH 01/08/2018 1132   SPECREQUEST  01/08/2018 1132    NONE Performed at [facility], [place] [address], [place], [place] [address]    CULT MULTIPLE SPECIES PRESENT, SUGGEST RECOLLECTION (A) 01/08/2018 1132   REPTSTATUS 01/09/2018 FINAL 01/08/2018 1132    CBG: Recent Labs  Lab 01/08/18 2021 01/08/18 2342 01/09/18 0345 01/09/18 0812 01/09/18 1211  GLUCAP 200*  216* 100* 110* 143*    Lab Results  Component Value Date   INR 1.03 01/07/2018   INR 0.97 12/22/2017   Studies/Results: Ct Head Wo Contrast  Result Date: 01/07/2018 CLINICAL DATA:  Altered mental status. EXAM: CT HEAD WITHOUT CONTRAST TECHNIQUE: Contiguous axial images were obtained from the base of the skull through the vertex without intravenous contrast. COMPARISON:  CT head dated December 22, 2017. FINDINGS: Brain: No evidence of acute infarction, hemorrhage, hydrocephalus, extra-axial collection or mass lesion/mass effect. Vascular: Calcified atherosclerosis at the skullbase. No hyperdense vessel. Skull: Normal. Negative for fracture or focal lesion. Sinuses/Orbits: No acute finding. Unchanged bilateral maxillary sinus mucosal thickening. Other: None. IMPRESSION: 1.  No acute intracranial abnormality. Electronically Signed   By: Titus Dubin M.D.   On: 01/07/2018 16:03   Dg Pelvis Portable  Result Date: 01/08/2018 CLINICAL DATA:  Acute onset of lower back pain. EXAM: PORTABLE PELVIS 1-2 VIEWS COMPARISON:  CT of the abdomen and pelvis from 12/22/2017 FINDINGS: There is no evidence of fracture or dislocation. Evaluation is mildly suboptimal due to patient rotation. Both femoral heads are seated normally within their respective  acetabula. No significant degenerative change is appreciated. The sacroiliac joints are unremarkable in appearance. The visualized bowel gas pattern is grossly unremarkable in appearance. IMPRESSION: No evidence of fracture or dislocation. Electronically Signed   By: Garald Balding M.D.   On: 01/08/2018 03:35   Dg Chest Port 1 View  Result Date: 01/07/2018 CLINICAL DATA:  AMS EXAM: PORTABLE CHEST 1 VIEW COMPARISON:  12/22/2017 FINDINGS: The heart is enlarged. There are prominent interstitial markings consistent with interstitial edema. No focal consolidations or overt alveolar edema. IMPRESSION: Cardiomegaly and interstitial pulmonary edema. Electronically Signed   By:  Nolon Nations M.D.   On: 01/07/2018 15:36    Medications: . sodium chloride    . sodium chloride    . sodium chloride    . sodium chloride     . Chlorhexidine Gluconate Cloth  6 each Topical Q0600  . cloNIDine  0.2 mg Transdermal Weekly  . doxercalciferol  1 mcg Intravenous Q T,Th,Sa-HD  . heparin  5,000 Units Subcutaneous Q8H  . insulin aspart  0-9 Units Subcutaneous TID AC & HS  . latanoprost  1 drop Both Eyes QHS  . multivitamin  1 tablet Oral QHS  . sevelamer carbonate  2,400 mg Oral TID WC    Dialysis Orders: TTS - AF 4hrs, 350/800, EDW 94.5kg, 2K2.25Ca hectorol 37mcg qHD Hep 4000 unit bolus mircera 153mcg q2wks - last 7/11 RUE AVF  home meds: - norvasc 10 qd/ coreg 6.25 bid - humalog 2u tid ac - phoslo/ alb hfa prn/ ecasa 81/ plavix 75/ mvi/ prn zofran - neurontin 300 hs/ lexapro 10 mg qd   Assessment/Plan: 1. AMS - uremia from missing HD vs catatonic/psych related? Improved today. 2. MDD/Suicidial ideation - appreciate psych rec - restart lexapro, start trazadone, to have OP therapy set up.  3. ESRD - TTS HD. K 4.9. HD today per regular schedule.  4. Anemia of CKD- Hgb 10.6. No indication for ESA at this time. Continue to follow.  5. Secondary hyperparathyroidism - Ca 8.5, phos 4.4. Continue VDRA and binders.  6. HTN/volume -  BP elevated but improved with clonidine patch.  Should be able to restart coreg/norvasc post HD.  Does not appear grossly volume overloaded on exam, titrate down as tolerated.  7. Nutrition - Alb 3.3. Renal diet w/fluid restrictions. Nepro. Renavite.  8. DM - per primary   Jen Mow, PA-C [facility] Pager: [phone] 01/09/2018,12:52 PM  LOS: 1 day   Pt seen, examined and agree w A/P as above.  Kelly Splinter MD [employer] pager [phone]   01/09/2018, 2:25 PM

## 2018-01-09 NOTE — Progress Notes (Signed)
PROGRESS NOTE    Lauren Warner  FAO:[id number] DOB: Dec 20, 1965 DOA: 01/07/2018  PCP: [name], [name]   Brief Narrative:  52 y.o. female with history of ESRD on HD, not compliant with HD, presented with suicidal ideations, altered mental status.  Assessment & Plan:   Principal Problem:   Acute metabolic encephalopathy - Unclear etiology, although possibly from uremia, non-compliance with HD - CT head without acute findings - Improved since admission   Active Problems:   ESRD (end stage renal disease) ([place]) - On HD - Nephrology following     Anemia due to end stage renal disease ([place]) - Hgb stable    DM (diabetes mellitus), type 2 with renal complications ([facility]) - Continue SSI    Accelerated hypertension  - BP 134/78, improved with hydralazine IV    Suicidal ideation - Psych consulted - Cleared by psych, no imminent risk to self or others, dc safety sitter - Started lexapro   DVT prophylaxis: Heparin subQ Code Status: full code  Family Communication: no family at bedside Disposition Plan: home once cleared by renal, hope today or tomorrow   Consultants:   Psychiatry   Nephrology   Procedures:   HD  Antimicrobials:   None    Subjective: No overnight events.  Objective: Vitals:   01/09/18 0350 01/09/18 0420 01/09/18 0434 01/09/18 0725  BP: (!) 152/106 (!) 159/99  132/68  Pulse: 85 85    Resp: (!) 24 13  19   Temp: 98.9 F (37.2 C)     TempSrc: Oral     SpO2: 100% 96%  99%  Weight:   95.8 kg   Height:        Intake/Output Summary (Last 24 hours) at 01/09/2018 1030 Last data filed at 01/09/2018 0945 Gross per 24 hour  Intake 940 ml  Output 2000 ml  Net -1060 ml   Filed Weights   01/08/18 1511 01/08/18 1811 01/09/18 0434  Weight: 97 kg 95 kg 95.8 kg    Physical Exam  Constitutional: Appears well-developed and well-nourished. No distress.  CVS: RRR, S1/S2 + Pulmonary: Effort and breath sounds normal, no stridor, rhonchi,  wheezes, rales.  Abdominal: Soft. BS +,  no distension, tenderness, rebound or guarding.  Musculoskeletal: Normal range of motion. No edema and no tenderness.  Lymphadenopathy: No lymphadenopathy noted, cervical, inguinal. Neuro: Alert. Normal reflexes, muscle tone coordination. No cranial nerve deficit. Skin: Skin is warm and dry. No rash noted. Not diaphoretic. No erythema. No pallor.  Psychiatric: Normal mood and affect. Behavior, judgment, thought content normal.     Data Reviewed: I have personally reviewed following labs and imaging studies  CBC: Recent Labs  Lab 01/07/18 1612 01/07/18 1955 01/08/18 0538 01/09/18 0528  WBC 4.8  --  4.6 4.1  NEUTROABS 2.9  --  3.0  --   HGB 9.7* 10.2* 10.9* 10.6*  HCT 32.9* 30.0* 35.4* 35.4*  MCV 79.3  --  76.8* 77.5*  PLT 215  --  204 696   Basic Metabolic Panel: Recent Labs  Lab 01/07/18 1955 01/08/18 0538 01/09/18 0528  NA 138 138 136  K 5.8* 3.9 4.9  CL 106 97* 98  CO2  --  25 26  GLUCOSE 178* 131* 109*  BUN 125* 39* 35*  CREATININE 13.20* 5.77* 5.56*  CALCIUM  --  8.3* 8.5*  PHOS  --  4.4  --    GFR: Estimated Creatinine Clearance: 14.4 mL/min (A) (by C-G formula based on SCr of 5.56 mg/dL (H)). Liver Function  Tests: Recent Labs  Lab 01/08/18 0538  AST 12*  ALT 18  ALKPHOS 93  BILITOT 0.7  PROT 7.1  ALBUMIN 3.3*   No results for input(s): LIPASE, AMYLASE in the last 168 hours. Recent Labs  Lab 01/07/18 1612  AMMONIA 55*   Coagulation Profile: Recent Labs  Lab 01/07/18 1935  INR 1.03   Cardiac Enzymes: No results for input(s): CKTOTAL, CKMB, CKMBINDEX, TROPONINI in the last 168 hours. BNP (last 3 results) No results for input(s): PROBNP in the last 8760 hours. HbA1C: No results for input(s): HGBA1C in the last 72 hours. CBG: Recent Labs  Lab 01/08/18 1215 01/08/18 2021 01/08/18 2342 01/09/18 0345 01/09/18 0812  GLUCAP 131* 200* 216* 100* 110*   Lipid Profile: No results for input(s): CHOL,  HDL, LDLCALC, TRIG, CHOLHDL, LDLDIRECT in the last 72 hours. Thyroid Function Tests: Recent Labs    01/08/18 0538  TSH 2.887   Anemia Panel: No results for input(s): VITAMINB12, FOLATE, FERRITIN, TIBC, IRON, RETICCTPCT in the last 72 hours. Urine analysis:    Component Value Date/Time   COLORURINE YELLOW 01/08/2018 1132   APPEARANCEUR HAZY (A) 01/08/2018 1132   LABSPEC 1.011 01/08/2018 1132   PHURINE 8.0 01/08/2018 1132   GLUCOSEU >=500 (A) 01/08/2018 1132   HGBUR SMALL (A) 01/08/2018 1132   BILIRUBINUR NEGATIVE 01/08/2018 1132   KETONESUR NEGATIVE 01/08/2018 1132   PROTEINUR >=300 (A) 01/08/2018 1132   NITRITE NEGATIVE 01/08/2018 1132   LEUKOCYTESUR NEGATIVE 01/08/2018 1132   Sepsis Labs: @LABRCNTIP (procalcitonin:4,lacticidven:4)   MRSA PCR Screening     Status: None   Collection Time: 01/08/18  3:16 AM  Result Value Ref Range Status   MRSA by PCR NEGATIVE NEGATIVE Final      Radiology Studies: Ct Head Wo Contrast Result Date: 01/07/2018 1.  No acute intracranial abnormality.   Dg Pelvis Portable Result Date: 01/08/2018 No evidence of fracture or dislocation.   Dg Chest Port 1 View Result Date: 01/07/2018 Cardiomegaly and interstitial pulmonary edema.    Scheduled Meds: .  doxercalciferol  1 mcg Intravenous Q T,Th,Sa-HD  . heparin  5,000 Units Subcutaneous Q8H  . insulin aspart  0-9 Units Subcutaneous Q4H   Continuous Infusions: . sodium chloride    . sodium chloride       LOS: 1 day    Time spent: 15 minutes Greater than 50% of the time spent on counseling and coordinating the care.   Leisa Lenz, MD [facility] Pager [phone]  If 7PM-7AM, please contact night-coverage [email or web address] Password [email or web address] 01/09/2018, 10:30 AM

## 2018-01-10 ENCOUNTER — Other Ambulatory Visit: Payer: Self-pay

## 2018-01-10 LAB — GLUCOSE, CAPILLARY
Glucose-Capillary: 143 mg/dL — ABNORMAL HIGH (ref 70–99)
Glucose-Capillary: 307 mg/dL — ABNORMAL HIGH (ref 70–99)

## 2018-01-10 MED ORDER — PNEUMOCOCCAL VAC POLYVALENT 25 MCG/0.5ML IJ INJ
0.5000 mL | INJECTION | INTRAMUSCULAR | Status: AC
  Administered 2018-01-10: 0.5 mL via INTRAMUSCULAR
  Filled 2018-01-10: qty 0.5

## 2018-01-10 MED ORDER — SEVELAMER CARBONATE 800 MG PO TABS: 2400.0000 mg | ORAL_TABLET | Freq: Three times a day (TID) | ORAL | 0 refills | Status: DC

## 2018-01-10 NOTE — Progress Notes (Signed)
Patient given all discharge paperwork and all questions answered. Vaccine given per order, and patient purse and clothing returned. Patient escorted to NT entrance and was taken home via cab.

## 2018-01-10 NOTE — Progress Notes (Signed)
CSW met with patient to provide resources for crisis assistance as well as for mental health resources in the [place] area. CSW provided resources, and patient discussed how she was very appreciative of information. Patient mentioned paperwork that was brought in to the ED for her to assist with her rent. CSW located paperwork through [facility] in the ED and provided to patient to take home for her appointment tomorrow. CSW noted that patient will need a copy of her photo ID and social security card; patient says they are in her purse, which she thinks she brought with her to the hospital. RN to check with security for patient's possessions.   CSW provided taxi voucher to RN for patient to return home. CSW signing off.  Laveda Abbe, [place] Clinical Social Worker [phone]

## 2018-01-10 NOTE — Discharge Summary (Addendum)
Physician Discharge Summary  Lauren Warner QAS:[id number] DOB: [birth year] DOA: 01/07/2018  PCP: [name], [name]  Admit date: 01/07/2018 Discharge date: 01/10/2018  Recommendations for Outpatient Follow-up:  1. Check CBC and BMP during next visit to PCP 2. Follow up with renal for HD  Discharge Diagnoses:  Principal Problem:   MDD (major depressive disorder), single episode, moderate (HCC) Active Problems:   ESRD (end stage renal disease) ([place])   Anemia due to end stage renal disease ([place])   DM (diabetes mellitus), type 2 with renal complications ([place])   Hypertensive urgency   Acute encephalopathy   Suicidal ideation    Discharge Condition: stable   Diet recommendation: as tolerated   History of present illness:  52 y.o.femalewithhistory of ESRD on HD, not compliant with HD, presented with suicidal ideations, altered mental status.  Hospital Course:   Assessment & Plan:   Principal Problem:   Acute metabolic encephalopathy - Unclear etiology, although possibly from uremia, non-compliance with HD - CT head without acute findings - Stable, resolved   Active Problems:   ESRD (end stage renal disease) ([place]) - HD per renal sch    Acute on chronic diastolic CHF - Last ECHO in 09/2017 showed preserved EF - Manage volume with HD    Anemia due to end stage renal disease ([place]) - Hgb stable    DM (diabetes mellitus), type 2 with renal complications ([place]) - SSI in hospital  - Outpt follow up    Accelerated hypertension  - Improved with hydralazine     Suicidal ideation - Psych consulted - Cleared by psych, no imminent risk to self or others, dc safety sitter - Resume lexapro    DVT prophylaxis: Heparin subQ Code Status: full code  Family Communication: no family at bedside    Consultants:   Psychiatry   Nephrology   Procedures:   HD  Antimicrobials:   None    Signed:  Leisa Lenz, MD  [facility] 01/10/2018,  11:24 AM  Pager #: [phone]  Time spent in minutes:35 minutes    Discharge Exam: Vitals:   01/10/18 0400 01/10/18 0830  BP: (!) 159/89   Pulse: 73 72  Resp: 16 13  Temp:    SpO2: 100% 99%   Vitals:   01/10/18 0351 01/10/18 0400 01/10/18 0500 01/10/18 0830  BP: (!) 136/120 (!) 159/89    Pulse: 71 73  72  Resp: 18 16  13   Temp:      TempSrc:      SpO2: 97% 100%  99%  Weight:   94.8 kg   Height:        General: Pt is alert, follows commands appropriately, not in acute distress Cardiovascular: Regular rate and rhythm, S1/S2 +, no murmurs Respiratory: Clear to auscultation bilaterally, no wheezing, no crackles, no rhonchi Abdominal: Soft, non tender, non distended, bowel sounds +, no guarding Extremities: no edema, no cyanosis, pulses palpable bilaterally DP and PT Neuro: Grossly nonfocal  Discharge Instructions  Discharge Instructions    Call MD for:  persistant nausea and vomiting   Complete by:  As directed    Call MD for:  redness, tenderness, or signs of infection (pain, swelling, redness, odor or green/yellow discharge around incision site)   Complete by:  As directed    Call MD for:  severe uncontrolled pain   Complete by:  As directed    Diet - low sodium heart healthy   Complete by:  As directed    Increase activity slowly  Complete by:  As directed      Allergies as of 01/10/2018      Reactions   Adhesive [tape] Other (See Comments)   "RIPS MY SKIN," SO PLEASE USE COBAN WRAP!!   Hydrocodone-acetaminophen Other (See Comments)   "Makes me feel crazy"   Metformin And Related Other (See Comments)   "Makes me feel crazy"      Medication List    TAKE these medications   albuterol 108 (90 Base) MCG/ACT inhaler Commonly known as:  PROVENTIL HFA;VENTOLIN HFA Inhale 2 puffs into the lungs every 6 (six) hours as needed for wheezing or shortness of breath.   amLODipine 10 MG tablet Commonly known as:  NORVASC Take 1 tablet (10 mg total) by mouth  daily. What changed:  how much to take   aspirin EC 81 MG tablet Take 1 tablet (81 mg total) by mouth daily. With food   calcium acetate 667 MG capsule Commonly known as:  PHOSLO Take 1 capsule (667 mg total) by mouth 3 (three) times daily with meals.   carvedilol 6.25 MG tablet Commonly known as:  COREG Take 1 tablet (6.25 mg total) by mouth 2 (two) times daily with a meal.   clopidogrel 75 MG tablet Commonly known as:  PLAVIX Take 1 tablet (75 mg total) by mouth daily.   escitalopram 10 MG tablet Commonly known as:  LEXAPRO Take 1 tablet (10 mg total) by mouth daily.   gabapentin 300 MG capsule Commonly known as:  NEURONTIN Take 1 capsule (300 mg total) by mouth at bedtime.   insulin lispro 100 UNIT/ML injection Commonly known as:  HUMALOG Inject 0.02 mLs (2 Units total) into the skin 3 (three) times daily before meals. As long as Blood sugar is >  110   latanoprost 0.005 % ophthalmic solution Commonly known as:  XALATAN Place 1 drop into both eyes at bedtime.   multivitamin Tabs tablet Take 1 tablet by mouth at bedtime.   ondansetron 4 MG tablet Commonly known as:  ZOFRAN Take 1 tablet (4 mg total) by mouth every 8 (eight) hours as needed for nausea or vomiting.   sevelamer carbonate 800 MG tablet Commonly known as:  RENVELA Take 3 tablets (2,400 mg total) by mouth 3 (three) times daily with meals.      Follow-up Information    Kathrynn Humble Brennan Bailey, [name]. Schedule an appointment as soon as possible for a visit in 2 week(s).   Specialty:  Internal Medicine Contact information: [address] [place] [address] [phone]            The results of significant diagnostics from this hospitalization (including imaging, microbiology, ancillary and laboratory) are listed below for reference.    Significant Diagnostic Studies: Dg Chest 2 View  Result Date: 12/22/2017 CLINICAL DATA:  Chest pain. EXAM: CHEST - 2 VIEW COMPARISON:   Radiographs of December 03, 2017. FINDINGS: Stable cardiomegaly with central pulmonary vascular congestion is noted. No pneumothorax or pleural effusion is noted. No consolidative process is noted. Bony thorax is unremarkable. IMPRESSION: Stable cardiomegaly with central pulmonary vascular congestion. Electronically Signed   By: Marijo Conception, M.D.   On: 12/22/2017 18:44   Ct Head Wo Contrast  Result Date: 01/07/2018 CLINICAL DATA:  Altered mental status. EXAM: CT HEAD WITHOUT CONTRAST TECHNIQUE: Contiguous axial images were obtained from the base of the skull through the vertex without intravenous contrast. COMPARISON:  CT head dated December 22, 2017. FINDINGS: Brain: No evidence of acute  infarction, hemorrhage, hydrocephalus, extra-axial collection or mass lesion/mass effect. Vascular: Calcified atherosclerosis at the skullbase. No hyperdense vessel. Skull: Normal. Negative for fracture or focal lesion. Sinuses/Orbits: No acute finding. Unchanged bilateral maxillary sinus mucosal thickening. Other: None. IMPRESSION: 1.  No acute intracranial abnormality. Electronically Signed   By: Titus Dubin M.D.   On: 01/07/2018 16:03   Ct Head Wo Contrast  Result Date: 12/22/2017 CLINICAL DATA:  52 year old with altered level of consciousness. Dialysis patient. EXAM: CT HEAD WITHOUT CONTRAST TECHNIQUE: Contiguous axial images were obtained from the base of the skull through the vertex without intravenous contrast. COMPARISON:  10/18/2017 FINDINGS: Brain: No evidence for acute hemorrhage, mass lesion, midline shift, hydrocephalus or large infarct. Vascular: Atherosclerotic calcifications involving the bilateral vertebral arteries. Skull: Negative for fracture or focal lesion. Sinuses/Orbits: Mucosal disease in the maxillary sinuses. Other: None. IMPRESSION: No acute intracranial abnormality. Electronically Signed   By: Markus Daft M.D.   On: 12/22/2017 20:40   Ct Abdomen Pelvis W Contrast  Result Date:  12/22/2017 CLINICAL DATA:  Left-sided flank pain EXAM: CT ABDOMEN AND PELVIS WITH CONTRAST TECHNIQUE: Multidetector CT imaging of the abdomen and pelvis was performed using the standard protocol following bolus administration of intravenous contrast. CONTRAST:  166mL OMNIPAQUE IOHEXOL 300 MG/ML  SOLN COMPARISON:  10/18/2017 FINDINGS: Lower chest: No acute abnormality. Hepatobiliary: Multiple gallstones are noted without complicating factors. The liver is within normal limits. Pancreas: Unremarkable. No pancreatic ductal dilatation or surrounding inflammatory changes. Spleen: Normal in size without focal abnormality. Adrenals/Urinary Tract: Adrenal glands are within normal limits. Kidneys demonstrate no renal calculi or urinary tract obstructive changes. Mild renal vascular calcifications are seen. The bladder is well distended. Stomach/Bowel: Stomach is within normal limits. Appendix appears normal. No evidence of bowel wall thickening, distention, or inflammatory changes. Vascular/Lymphatic: IVC filter is noted in place. No lymphadenopathy is identified. Diffuse vascular calcifications are noted without aneurysmal dilatation. Reproductive: Calcifications in the uterus are noted likely related to fibroid change. No adnexal mass is noted. Other: No abdominal wall hernia or abnormality. No abdominopelvic ascites. Musculoskeletal: No acute bony abnormality is noted. IMPRESSION: Cholelithiasis without complicating factors. Chronic changes as described above.  No acute abnormality noted. Electronically Signed   By: Inez Catalina M.D.   On: 12/22/2017 21:48   Dg Pelvis Portable  Result Date: 01/08/2018 CLINICAL DATA:  Acute onset of lower back pain. EXAM: PORTABLE PELVIS 1-2 VIEWS COMPARISON:  CT of the abdomen and pelvis from 12/22/2017 FINDINGS: There is no evidence of fracture or dislocation. Evaluation is mildly suboptimal due to patient rotation. Both femoral heads are seated normally within their respective  acetabula. No significant degenerative change is appreciated. The sacroiliac joints are unremarkable in appearance. The visualized bowel gas pattern is grossly unremarkable in appearance. IMPRESSION: No evidence of fracture or dislocation. Electronically Signed   By: Garald Balding M.D.   On: 01/08/2018 03:35   Dg Chest Port 1 View  Result Date: 01/07/2018 CLINICAL DATA:  AMS EXAM: PORTABLE CHEST 1 VIEW COMPARISON:  12/22/2017 FINDINGS: The heart is enlarged. There are prominent interstitial markings consistent with interstitial edema. No focal consolidations or overt alveolar edema. IMPRESSION: Cardiomegaly and interstitial pulmonary edema. Electronically Signed   By: Nolon Nations M.D.   On: 01/07/2018 15:36    Microbiology: Recent Results (from the past 240 hour(s))  Blood Cultures (routine x 2)     Status: None (Preliminary result)   Collection Time: 01/07/18 12:35 PM  Result Value Ref Range Status   Specimen Description  BLOOD SITE NOT SPECIFIED  Final   Special Requests   Final    BOTTLES DRAWN AEROBIC ONLY Blood Culture results may not be optimal due to an inadequate volume of blood received in culture bottles   Culture   Final    NO GROWTH 2 DAYS Performed at [facility], [place] [address] [place], [place] [address]    Report Status PENDING  Incomplete  Blood Cultures (routine x 2)     Status: None (Preliminary result)   Collection Time: 01/07/18  4:12 PM  Result Value Ref Range Status   Specimen Description BLOOD RIGHT HAND  Final   Special Requests   Final    BOTTLES DRAWN AEROBIC AND ANAEROBIC Blood Culture adequate volume   Culture   Final    NO GROWTH 2 DAYS Performed at [facility], [place] [address], [place], [place] [address]    Report Status PENDING  Incomplete  MRSA PCR Screening     Status: None   Collection Time: 01/08/18  3:16 AM  Result Value Ref Range Status   MRSA by PCR NEGATIVE NEGATIVE Final    Comment:        The GeneXpert MRSA Assay  (FDA approved for NASAL specimens only), is one component of a comprehensive MRSA colonization surveillance program. It is not intended to diagnose MRSA infection nor to guide or monitor treatment for MRSA infections. Performed at [facility], [place] [address] [place], [place] [address]   Urine culture     Status: Abnormal   Collection Time: 01/08/18 11:32 AM  Result Value Ref Range Status   Specimen Description URINE, CLEAN CATCH  Final   Special Requests   Final    NONE Performed at [facility], [place] [address] [place], [place] [address]    Culture MULTIPLE SPECIES PRESENT, SUGGEST RECOLLECTION (A)  Final   Report Status 01/09/2018 FINAL  Final     Labs: Basic Metabolic Panel: Recent Labs  Lab 01/07/18 1955 01/08/18 0538 01/09/18 0528  NA 138 138 136  K 5.8* 3.9 4.9  CL 106 97* 98  CO2  --  25 26  GLUCOSE 178* 131* 109*  BUN 125* 39* 35*  CREATININE 13.20* 5.77* 5.56*  CALCIUM  --  8.3* 8.5*  PHOS  --  4.4  --    Liver Function Tests: Recent Labs  Lab 01/08/18 0538  AST 12*  ALT 18  ALKPHOS 93  BILITOT 0.7  PROT 7.1  ALBUMIN 3.3*   No results for input(s): LIPASE, AMYLASE in the last 168 hours. Recent Labs  Lab 01/07/18 1612  AMMONIA 55*   CBC: Recent Labs  Lab 01/07/18 1612 01/07/18 1955 01/08/18 0538 01/09/18 0528  WBC 4.8  --  4.6 4.1  NEUTROABS 2.9  --  3.0  --   HGB 9.7* 10.2* 10.9* 10.6*  HCT 32.9* 30.0* 35.4* 35.4*  MCV 79.3  --  76.8* 77.5*  PLT 215  --  204 227   Cardiac Enzymes: No results for input(s): CKTOTAL, CKMB, CKMBINDEX, TROPONINI in the last 168 hours. BNP: BNP (last 3 results) No results for input(s): BNP in the last 8760 hours.  ProBNP (last 3 results) No results for input(s): PROBNP in the last 8760 hours.  CBG: Recent Labs  Lab 01/09/18 0812 01/09/18 1211 01/09/18 1844 01/09/18 2108 01/10/18 0743  GLUCAP 110* 143* 114* 197* 143*

## 2018-01-10 NOTE — Discharge Instructions (Signed)
Follow up with renal for HD

## 2018-01-12 LAB — CULTURE, BLOOD (ROUTINE X 2)
CULTURE: NO GROWTH
CULTURE: NO GROWTH
SPECIAL REQUESTS: ADEQUATE

## 2018-01-21 ENCOUNTER — Emergency Department (HOSPITAL_COMMUNITY): Payer: Medicaid Other

## 2018-01-21 ENCOUNTER — Inpatient Hospital Stay (HOSPITAL_COMMUNITY): Payer: Medicaid Other

## 2018-01-21 ENCOUNTER — Other Ambulatory Visit: Payer: Self-pay

## 2018-01-21 ENCOUNTER — Inpatient Hospital Stay (HOSPITAL_COMMUNITY)
Admission: EM | Admit: 2018-01-21 | Discharge: 2018-01-25 | DRG: 291 | Disposition: A | Discharge status: Home Health | Payer: Medicaid Other | Attending: Internal Medicine | Admitting: Internal Medicine

## 2018-01-21 ENCOUNTER — Encounter (HOSPITAL_COMMUNITY): Payer: Self-pay

## 2018-01-21 DIAGNOSIS — I951 Orthostatic hypotension: Secondary | ICD-10-CM | POA: Diagnosis not present

## 2018-01-21 DIAGNOSIS — E7800 Pure hypercholesterolemia, unspecified: Secondary | ICD-10-CM | POA: Diagnosis present

## 2018-01-21 DIAGNOSIS — Z9115 Patient's noncompliance with renal dialysis: Secondary | ICD-10-CM | POA: Diagnosis not present

## 2018-01-21 DIAGNOSIS — J9600 Acute respiratory failure, unspecified whether with hypoxia or hypercapnia: Secondary | ICD-10-CM | POA: Diagnosis present

## 2018-01-21 DIAGNOSIS — E1129 Type 2 diabetes mellitus with other diabetic kidney complication: Secondary | ICD-10-CM | POA: Diagnosis present

## 2018-01-21 DIAGNOSIS — I10 Essential (primary) hypertension: Secondary | ICD-10-CM | POA: Diagnosis not present

## 2018-01-21 DIAGNOSIS — Z794 Long term (current) use of insulin: Secondary | ICD-10-CM

## 2018-01-21 DIAGNOSIS — Z86711 Personal history of pulmonary embolism: Secondary | ICD-10-CM | POA: Diagnosis not present

## 2018-01-21 DIAGNOSIS — Z7902 Long term (current) use of antithrombotics/antiplatelets: Secondary | ICD-10-CM | POA: Diagnosis not present

## 2018-01-21 DIAGNOSIS — E785 Hyperlipidemia, unspecified: Secondary | ICD-10-CM | POA: Diagnosis present

## 2018-01-21 DIAGNOSIS — N186 End stage renal disease: Secondary | ICD-10-CM | POA: Diagnosis present

## 2018-01-21 DIAGNOSIS — Z955 Presence of coronary angioplasty implant and graft: Secondary | ICD-10-CM

## 2018-01-21 DIAGNOSIS — J9601 Acute respiratory failure with hypoxia: Secondary | ICD-10-CM | POA: Diagnosis present

## 2018-01-21 DIAGNOSIS — Z885 Allergy status to narcotic agent status: Secondary | ICD-10-CM

## 2018-01-21 DIAGNOSIS — J810 Acute pulmonary edema: Secondary | ICD-10-CM | POA: Diagnosis not present

## 2018-01-21 DIAGNOSIS — M79609 Pain in unspecified limb: Secondary | ICD-10-CM | POA: Diagnosis not present

## 2018-01-21 DIAGNOSIS — I2510 Atherosclerotic heart disease of native coronary artery without angina pectoris: Secondary | ICD-10-CM | POA: Diagnosis present

## 2018-01-21 DIAGNOSIS — I132 Hypertensive heart and chronic kidney disease with heart failure and with stage 5 chronic kidney disease, or end stage renal disease: Secondary | ICD-10-CM | POA: Diagnosis present

## 2018-01-21 DIAGNOSIS — Z9981 Dependence on supplemental oxygen: Secondary | ICD-10-CM | POA: Diagnosis not present

## 2018-01-21 DIAGNOSIS — F4310 Post-traumatic stress disorder, unspecified: Secondary | ICD-10-CM | POA: Diagnosis present

## 2018-01-21 DIAGNOSIS — Z91048 Other nonmedicinal substance allergy status: Secondary | ICD-10-CM

## 2018-01-21 DIAGNOSIS — R0602 Shortness of breath: Secondary | ICD-10-CM | POA: Diagnosis not present

## 2018-01-21 DIAGNOSIS — Z9119 Patient's noncompliance with other medical treatment and regimen: Secondary | ICD-10-CM

## 2018-01-21 DIAGNOSIS — I5033 Acute on chronic diastolic (congestive) heart failure: Secondary | ICD-10-CM | POA: Diagnosis present

## 2018-01-21 DIAGNOSIS — F329 Major depressive disorder, single episode, unspecified: Secondary | ICD-10-CM | POA: Diagnosis present

## 2018-01-21 DIAGNOSIS — M7989 Other specified soft tissue disorders: Secondary | ICD-10-CM

## 2018-01-21 DIAGNOSIS — E875 Hyperkalemia: Secondary | ICD-10-CM | POA: Diagnosis present

## 2018-01-21 DIAGNOSIS — Z823 Family history of stroke: Secondary | ICD-10-CM

## 2018-01-21 DIAGNOSIS — J45909 Unspecified asthma, uncomplicated: Secondary | ICD-10-CM | POA: Diagnosis present

## 2018-01-21 DIAGNOSIS — D631 Anemia in chronic kidney disease: Secondary | ICD-10-CM | POA: Diagnosis present

## 2018-01-21 DIAGNOSIS — Z992 Dependence on renal dialysis: Secondary | ICD-10-CM

## 2018-01-21 DIAGNOSIS — R609 Edema, unspecified: Secondary | ICD-10-CM

## 2018-01-21 DIAGNOSIS — E1022 Type 1 diabetes mellitus with diabetic chronic kidney disease: Secondary | ICD-10-CM | POA: Diagnosis present

## 2018-01-21 DIAGNOSIS — N189 Chronic kidney disease, unspecified: Secondary | ICD-10-CM

## 2018-01-21 DIAGNOSIS — Z9111 Patient's noncompliance with dietary regimen: Secondary | ICD-10-CM | POA: Diagnosis not present

## 2018-01-21 DIAGNOSIS — Z91119 Patient's noncompliance with dietary regimen due to unspecified reason: Secondary | ICD-10-CM

## 2018-01-21 DIAGNOSIS — J9690 Respiratory failure, unspecified, unspecified whether with hypoxia or hypercapnia: Secondary | ICD-10-CM | POA: Diagnosis present

## 2018-01-21 DIAGNOSIS — I161 Hypertensive emergency: Secondary | ICD-10-CM | POA: Diagnosis present

## 2018-01-21 DIAGNOSIS — Z7982 Long term (current) use of aspirin: Secondary | ICD-10-CM

## 2018-01-21 LAB — TROPONIN I: TROPONIN I: 0.04 ng/mL — AB (ref <0.03)

## 2018-01-21 LAB — MAGNESIUM: Magnesium: 2.2 mg/dL (ref 1.7–2.4)

## 2018-01-21 LAB — RENAL FUNCTION PANEL
ALBUMIN: 3.3 g/dL — AB (ref 3.5–5.0)
Anion gap: 15 (ref 5–15)
BUN: 115 mg/dL — AB (ref 6–20)
CALCIUM: 7.4 mg/dL — AB (ref 8.9–10.3)
CO2: 22 mmol/L (ref 22–32)
Chloride: 104 mmol/L (ref 98–111)
Creatinine, Ser: 12.11 mg/dL — ABNORMAL HIGH (ref 0.44–1.00)
GFR calc Af Amer: 4 mL/min — ABNORMAL LOW (ref >60)
GFR calc non Af Amer: 3 mL/min — ABNORMAL LOW (ref >60)
GLUCOSE: 85 mg/dL (ref 70–99)
PHOSPHORUS: 10.3 mg/dL — AB (ref 2.5–4.6)
Potassium: 7.2 mmol/L (ref 3.5–5.1)
SODIUM: 141 mmol/L (ref 135–145)

## 2018-01-21 LAB — BASIC METABOLIC PANEL
ANION GAP: 18 — AB (ref 5–15)
BUN: 117 mg/dL — AB (ref 6–20)
CHLORIDE: 103 mmol/L (ref 98–111)
CO2: 19 mmol/L — ABNORMAL LOW (ref 22–32)
Calcium: 7.4 mg/dL — ABNORMAL LOW (ref 8.9–10.3)
Creatinine, Ser: 12.2 mg/dL — ABNORMAL HIGH (ref 0.44–1.00)
GFR calc Af Amer: 4 mL/min — ABNORMAL LOW (ref >60)
GFR, EST NON AFRICAN AMERICAN: 3 mL/min — AB (ref >60)
GLUCOSE: 164 mg/dL — AB (ref 70–99)
Potassium: 6.9 mmol/L (ref 3.5–5.1)
Sodium: 140 mmol/L (ref 135–145)

## 2018-01-21 LAB — CBC WITH DIFFERENTIAL/PLATELET
ABS IMMATURE GRANULOCYTES: 0 10*3/uL (ref 0.0–0.1)
BASOS ABS: 0 10*3/uL (ref 0.0–0.1)
Basophils Relative: 0 %
Eosinophils Absolute: 0.2 10*3/uL (ref 0.0–0.7)
Eosinophils Relative: 3 %
HEMATOCRIT: 32 % — AB (ref 36.0–46.0)
HEMOGLOBIN: 9.7 g/dL — AB (ref 12.0–15.0)
Immature Granulocytes: 0 %
LYMPHS ABS: 1.5 10*3/uL (ref 0.7–4.0)
Lymphocytes Relative: 22 %
MCH: 23.5 pg — ABNORMAL LOW (ref 26.0–34.0)
MCHC: 30.3 g/dL (ref 30.0–36.0)
MCV: 77.5 fL — ABNORMAL LOW (ref 78.0–100.0)
MONO ABS: 0.4 10*3/uL (ref 0.1–1.0)
Monocytes Relative: 6 %
NEUTROS ABS: 4.6 10*3/uL (ref 1.7–7.7)
Neutrophils Relative %: 69 %
Platelets: 170 10*3/uL (ref 150–400)
RBC: 4.13 MIL/uL (ref 3.87–5.11)
RDW: 17.6 % — ABNORMAL HIGH (ref 11.5–15.5)
WBC: 6.7 10*3/uL (ref 4.0–10.5)

## 2018-01-21 LAB — HEMOGLOBIN A1C
Hgb A1c MFr Bld: 8 % — ABNORMAL HIGH (ref 4.8–5.6)
MEAN PLASMA GLUCOSE: 182.9 mg/dL

## 2018-01-21 LAB — GLUCOSE, CAPILLARY
GLUCOSE-CAPILLARY: 115 mg/dL — AB (ref 70–99)
GLUCOSE-CAPILLARY: 127 mg/dL — AB (ref 70–99)

## 2018-01-21 LAB — TSH: TSH: 1.661 u[IU]/mL (ref 0.350–4.500)

## 2018-01-21 MED ORDER — ONDANSETRON HCL 4 MG PO TABS
4.0000 mg | ORAL_TABLET | Freq: Four times a day (QID) | ORAL | Status: DC | PRN
  Administered 2018-01-22 – 2018-01-25 (×2): 4 mg via ORAL
  Filled 2018-01-21 (×2): qty 1

## 2018-01-21 MED ORDER — INSULIN ASPART 100 UNIT/ML ~~LOC~~ SOLN
0.0000 [IU] | Freq: Three times a day (TID) | SUBCUTANEOUS | Status: DC
  Administered 2018-01-22: 2 [IU] via SUBCUTANEOUS
  Administered 2018-01-23: 3 [IU] via SUBCUTANEOUS
  Administered 2018-01-24: 2 [IU] via SUBCUTANEOUS
  Administered 2018-01-25: 5 [IU] via SUBCUTANEOUS

## 2018-01-21 MED ORDER — CHLORHEXIDINE GLUCONATE CLOTH 2 % EX PADS
6.0000 | MEDICATED_PAD | Freq: Every day | CUTANEOUS | Status: DC
  Administered 2018-01-23: 6 via TOPICAL

## 2018-01-21 MED ORDER — INSULIN ASPART 100 UNIT/ML ~~LOC~~ SOLN
2.0000 [IU] | Freq: Three times a day (TID) | SUBCUTANEOUS | Status: DC
  Administered 2018-01-22 – 2018-01-25 (×8): 2 [IU] via SUBCUTANEOUS

## 2018-01-21 MED ORDER — ESCITALOPRAM OXALATE 10 MG PO TABS
10.0000 mg | ORAL_TABLET | Freq: Every day | ORAL | Status: DC
  Administered 2018-01-21 – 2018-01-25 (×5): 10 mg via ORAL
  Filled 2018-01-21 (×5): qty 1

## 2018-01-21 MED ORDER — HEPARIN SODIUM (PORCINE) 1000 UNIT/ML DIALYSIS
4000.0000 [IU] | Freq: Once | INTRAMUSCULAR | Status: DC
  Filled 2018-01-21: qty 4

## 2018-01-21 MED ORDER — ONDANSETRON HCL 4 MG PO TABS: 4.0000 mg | ORAL_TABLET | Freq: Three times a day (TID) | ORAL | Status: DC | PRN

## 2018-01-21 MED ORDER — SODIUM BICARBONATE 8.4 % IV SOLN
50.0000 meq | Freq: Once | INTRAVENOUS | Status: AC
  Administered 2018-01-21: 50 meq via INTRAVENOUS
  Filled 2018-01-21: qty 50

## 2018-01-21 MED ORDER — ALBUTEROL SULFATE HFA 108 (90 BASE) MCG/ACT IN AERS: 2.0000 | INHALATION_SPRAY | Freq: Four times a day (QID) | RESPIRATORY_TRACT | Status: DC | PRN

## 2018-01-21 MED ORDER — HYDRALAZINE HCL 20 MG/ML IJ SOLN
10.0000 mg | INTRAMUSCULAR | Status: DC | PRN
  Administered 2018-01-21: 20 mg via INTRAVENOUS

## 2018-01-21 MED ORDER — INSULIN ASPART 100 UNIT/ML IV SOLN: 10.0000 [IU] | Freq: Once | INTRAVENOUS | Status: DC

## 2018-01-21 MED ORDER — CARVEDILOL 6.25 MG PO TABS: 6.2500 mg | ORAL_TABLET | Freq: Two times a day (BID) | ORAL | Status: DC

## 2018-01-21 MED ORDER — ALUM & MAG HYDROXIDE-SIMETH 200-200-20 MG/5ML PO SUSP
30.0000 mL | ORAL | Status: DC | PRN
  Administered 2018-01-22: 30 mL via ORAL
  Filled 2018-01-21: qty 30

## 2018-01-21 MED ORDER — HEPARIN SODIUM (PORCINE) 1000 UNIT/ML DIALYSIS
1000.0000 [IU] | INTRAMUSCULAR | Status: DC | PRN
  Filled 2018-01-21: qty 1

## 2018-01-21 MED ORDER — AMLODIPINE BESYLATE 5 MG PO TABS
5.0000 mg | ORAL_TABLET | Freq: Every day | ORAL | Status: DC
  Administered 2018-01-21 – 2018-01-25 (×5): 5 mg via ORAL
  Filled 2018-01-21 (×5): qty 1

## 2018-01-21 MED ORDER — CARVEDILOL 6.25 MG PO TABS
6.2500 mg | ORAL_TABLET | Freq: Two times a day (BID) | ORAL | Status: DC
  Administered 2018-01-21 – 2018-01-25 (×8): 6.25 mg via ORAL
  Filled 2018-01-21 (×8): qty 1

## 2018-01-21 MED ORDER — ACETAMINOPHEN 325 MG PO TABS
650.0000 mg | ORAL_TABLET | Freq: Four times a day (QID) | ORAL | Status: DC | PRN
  Administered 2018-01-21 – 2018-01-23 (×5): 650 mg via ORAL
  Filled 2018-01-21 (×4): qty 2

## 2018-01-21 MED ORDER — SEVELAMER CARBONATE 800 MG PO TABS
2400.0000 mg | ORAL_TABLET | Freq: Three times a day (TID) | ORAL | Status: DC
  Administered 2018-01-22 – 2018-01-25 (×10): 2400 mg via ORAL
  Filled 2018-01-21 (×11): qty 3

## 2018-01-21 MED ORDER — LATANOPROST 0.005 % OP SOLN
1.0000 [drp] | Freq: Every day | OPHTHALMIC | Status: DC
  Administered 2018-01-21 – 2018-01-25 (×4): 1 [drp] via OPHTHALMIC
  Filled 2018-01-21: qty 2.5

## 2018-01-21 MED ORDER — INSULIN LISPRO 100 UNIT/ML ~~LOC~~ SOLN: 2.0000 [IU] | Freq: Three times a day (TID) | SUBCUTANEOUS | Status: DC

## 2018-01-21 MED ORDER — HYDRALAZINE HCL 20 MG/ML IJ SOLN
INTRAMUSCULAR | Status: AC
  Filled 2018-01-21: qty 1

## 2018-01-21 MED ORDER — GABAPENTIN 300 MG PO CAPS
300.0000 mg | ORAL_CAPSULE | Freq: Every day | ORAL | Status: DC
  Administered 2018-01-21 – 2018-01-25 (×4): 300 mg via ORAL
  Filled 2018-01-21 (×4): qty 1

## 2018-01-21 MED ORDER — CALCIUM ACETATE (PHOS BINDER) 667 MG PO CAPS
667.0000 mg | ORAL_CAPSULE | Freq: Three times a day (TID) | ORAL | Status: DC
  Administered 2018-01-22 – 2018-01-25 (×10): 667 mg via ORAL
  Filled 2018-01-21 (×10): qty 1

## 2018-01-21 MED ORDER — DEXTROSE 50 % IV SOLN
50.0000 mL | Freq: Once | INTRAVENOUS | Status: AC
  Administered 2018-01-21: 50 mL via INTRAVENOUS
  Filled 2018-01-21: qty 50

## 2018-01-21 MED ORDER — LORAZEPAM 2 MG/ML IJ SOLN
0.5000 mg | Freq: Once | INTRAMUSCULAR | Status: AC
  Administered 2018-01-21: 0.5 mg via INTRAVENOUS
  Filled 2018-01-21: qty 1

## 2018-01-21 MED ORDER — ALBUTEROL SULFATE (2.5 MG/3ML) 0.083% IN NEBU
5.0000 mg | INHALATION_SOLUTION | Freq: Once | RESPIRATORY_TRACT | Status: AC
  Administered 2018-01-21: 5 mg via RESPIRATORY_TRACT
  Filled 2018-01-21: qty 6

## 2018-01-21 MED ORDER — SODIUM CHLORIDE 0.9 % IV SOLN
1.0000 g | Freq: Once | INTRAVENOUS | Status: AC
  Administered 2018-01-21: 1 g via INTRAVENOUS
  Filled 2018-01-21: qty 10

## 2018-01-21 MED ORDER — LIDOCAINE-PRILOCAINE 2.5-2.5 % EX CREA
1.0000 "application " | TOPICAL_CREAM | CUTANEOUS | Status: DC | PRN
  Filled 2018-01-21: qty 5

## 2018-01-21 MED ORDER — INSULIN ASPART 100 UNIT/ML ~~LOC~~ SOLN
10.0000 [IU] | Freq: Once | SUBCUTANEOUS | Status: AC
  Administered 2018-01-21: 10 [IU] via INTRAVENOUS
  Filled 2018-01-21: qty 1

## 2018-01-21 MED ORDER — SODIUM CHLORIDE 0.9 % IV SOLN: 100.0000 mL | INTRAVENOUS | Status: DC | PRN

## 2018-01-21 MED ORDER — LIDOCAINE HCL (PF) 1 % IJ SOLN: 5.0000 mL | INTRAMUSCULAR | Status: DC | PRN

## 2018-01-21 MED ORDER — CHLORHEXIDINE GLUCONATE CLOTH 2 % EX PADS
6.0000 | MEDICATED_PAD | Freq: Every day | CUTANEOUS | Status: DC
  Administered 2018-01-22 – 2018-01-23 (×2): 6 via TOPICAL

## 2018-01-21 MED ORDER — PENTAFLUOROPROP-TETRAFLUOROETH EX AERO
1.0000 "application " | INHALATION_SPRAY | CUTANEOUS | Status: DC | PRN
  Filled 2018-01-21: qty 103.5

## 2018-01-21 MED ORDER — CLOPIDOGREL BISULFATE 75 MG PO TABS
75.0000 mg | ORAL_TABLET | Freq: Every day | ORAL | Status: DC
  Administered 2018-01-21 – 2018-01-25 (×5): 75 mg via ORAL
  Filled 2018-01-21 (×5): qty 1

## 2018-01-21 MED ORDER — ONDANSETRON HCL 4 MG/2ML IJ SOLN
4.0000 mg | Freq: Four times a day (QID) | INTRAMUSCULAR | Status: DC | PRN
  Administered 2018-01-21 – 2018-01-23 (×2): 4 mg via INTRAVENOUS
  Filled 2018-01-21 (×2): qty 2

## 2018-01-21 MED ORDER — AMLODIPINE BESYLATE 10 MG PO TABS
10.0000 mg | ORAL_TABLET | Freq: Every day | ORAL | Status: DC
  Administered 2018-01-21: 10 mg via ORAL
  Filled 2018-01-21: qty 1

## 2018-01-21 MED ORDER — ASPIRIN EC 81 MG PO TBEC
81.0000 mg | DELAYED_RELEASE_TABLET | Freq: Every day | ORAL | Status: DC
  Administered 2018-01-22 – 2018-01-25 (×4): 81 mg via ORAL
  Filled 2018-01-21 (×3): qty 1

## 2018-01-21 MED ORDER — HEPARIN SODIUM (PORCINE) 5000 UNIT/ML IJ SOLN
5000.0000 [IU] | Freq: Three times a day (TID) | INTRAMUSCULAR | Status: DC
  Administered 2018-01-21 – 2018-01-25 (×13): 5000 [IU] via SUBCUTANEOUS
  Filled 2018-01-21 (×13): qty 1

## 2018-01-21 MED ORDER — ACETAMINOPHEN 650 MG RE SUPP
650.0000 mg | Freq: Four times a day (QID) | RECTAL | Status: DC | PRN
  Filled 2018-01-21: qty 1

## 2018-01-21 MED ORDER — ALBUTEROL SULFATE (2.5 MG/3ML) 0.083% IN NEBU: 2.5000 mg | INHALATION_SOLUTION | Freq: Four times a day (QID) | RESPIRATORY_TRACT | Status: DC | PRN

## 2018-01-21 NOTE — ED Provider Notes (Signed)
[facility] EMERGENCY DEPARTMENT Provider Note   CSN: [id number] Arrival date & time:        History   Chief Complaint Chief Complaint  Patient presents with  . Respiratory Distress    HPI Lauren Warner is a 52 y.o. female.  The history is provided by the patient and the EMS personnel. The history is limited by the condition of the patient (Respiratory distress).  She has history of hypertension, hyperlipidemia, diabetes, end-stage renal disease on hemodialysis, major depressive disorder and comes in because of difficulty breathing.  She is a very poor historian, but states that she missed her last 2 dialysis sessions.  EMS put her on CPAP and transported her to the emergency department.  Past Medical History:  Diagnosis Date  . Anemia   . Anxiety   . Asthma   . CHF (congestive heart failure) ([place])   . Coronary artery disease   . Daily headache   . Depression   . ESRD (end stage renal disease) on dialysis ([place])    "[facility]; TTS" (10/19/2017)  . High cholesterol   . History of blood transfusion 10/19/2017   "anemia"  . Hyperkalemia 10/2017  . Hypertension   . On home oxygen therapy    "2L; daily" (10/19/2017)  . Pneumonia    "several times" (10/19/2017)  . Pulmonary embolism ([place]) 2012  . Type 1 diabetes mellitus [facility])     Patient Active Problem List   Diagnosis Date Noted  . MDD (major depressive disorder), single episode, moderate ([place])   . Acute encephalopathy 01/07/2018  . Suicidal ideation 01/07/2018  . Hypertensive encephalopathy 12/23/2017  . Hypertensive urgency 12/22/2017  . Syncope and collapse 12/03/2017  . Hyperkalemia 11/10/2017  . Pressure injury of skin 10/20/2017  . Essential hypertension 10/19/2017  . CAD (coronary artery disease) 10/19/2017  . ESRD (end stage renal disease) ([place]) 10/19/2017  . Syncope 10/19/2017  . Anemia due to end stage renal disease ([place]) 10/19/2017  . Closed left ankle fracture 10/19/2017  . Nausea  vomiting and diarrhea 10/19/2017  . DM (diabetes mellitus), type 2 with renal complications ([place]) 27/11/2374  . Acute respiratory failure with hypoxia ([place]) 10/18/2017    Past Surgical History:  Procedure Laterality Date  . AV FISTULA PLACEMENT Left 2018  . CESAREAN SECTION  1989  . CORONARY ANGIOPLASTY WITH STENT PLACEMENT  ~ 08/2017   "3 stents" (10/19/2017)  . ENDOMETRIAL ABLATION  ~ 2011     OB History   None      Home Medications    Prior to Admission medications   Medication Sig Start Date End Date Taking? Authorizing Provider  albuterol (PROAIR HFA) 108 (90 Base) MCG/ACT inhaler Inhale 2 puffs into the lungs every 6 (six) hours as needed for wheezing or shortness of breath. 12/04/17   Roxan Hockey, MD  amLODipine (NORVASC) 10 MG tablet Take 1 tablet (10 mg total) by mouth daily. Patient taking differently: Take 5 mg by mouth daily.  12/24/17 01/23/18  Alma Friendly, MD  aspirin EC 81 MG tablet Take 1 tablet (81 mg total) by mouth daily. With food 12/04/17   Roxan Hockey, MD  calcium acetate (PHOSLO) 667 MG capsule Take 1 capsule (667 mg total) by mouth 3 (three) times daily with meals. 12/04/17   Roxan Hockey, MD  carvedilol (COREG) 6.25 MG tablet Take 1 tablet (6.25 mg total) by mouth 2 (two) times daily with a meal. Patient not taking: Reported on 01/08/2018 12/04/17   Roxan Hockey,  MD  clopidogrel (PLAVIX) 75 MG tablet Take 1 tablet (75 mg total) by mouth daily. 12/04/17   Roxan Hockey, MD  escitalopram (LEXAPRO) 10 MG tablet Take 1 tablet (10 mg total) by mouth daily. 12/04/17   Roxan Hockey, MD  gabapentin (NEURONTIN) 300 MG capsule Take 1 capsule (300 mg total) by mouth at bedtime. 12/04/17   Roxan Hockey, MD  insulin lispro (HUMALOG) 100 UNIT/ML injection Inject 0.02 mLs (2 Units total) into the skin 3 (three) times daily before meals. As long as Blood sugar is >  110 12/04/17   Emokpae, Courage, MD  latanoprost (XALATAN) 0.005 % ophthalmic solution Place  1 drop into both eyes at bedtime. 12/04/17   Roxan Hockey, MD  multivitamin (RENA-VIT) TABS tablet Take 1 tablet by mouth at bedtime. 12/04/17   Roxan Hockey, MD  ondansetron (ZOFRAN) 4 MG tablet Take 1 tablet (4 mg total) by mouth every 8 (eight) hours as needed for nausea or vomiting. 12/04/17 12/04/18  Roxan Hockey, MD  sevelamer carbonate (RENVELA) 800 MG tablet Take 3 tablets (2,400 mg total) by mouth 3 (three) times daily with meals. 01/10/18   Robbie Lis, MD    Family History Family History  Problem Relation Age of Onset  . Stroke Sister     Social History Social History   Tobacco Use  . Smoking status: Never Smoker  . Smokeless tobacco: Never Used  Substance Use Topics  . Alcohol use: Never    Frequency: Never  . Drug use: Never     Allergies   Adhesive [tape]; Hydrocodone-acetaminophen; and Metformin and related   Review of Systems Review of Systems  Unable to perform ROS: Severe respiratory distress     Physical Exam Updated Vital Signs BP (!) 217/97 (BP Location: Right Arm)   Pulse (!) 105   Temp 98.3 F (36.8 C) (Temporal)   Resp (!) 28   LMP 10/18/2009 (Within Months) Comment: ablation  SpO2 94%   Physical Exam  Nursing note and vitals reviewed.  52 year old female, resting comfortably and in no acute distress. Vital signs are significant for markedly elevated blood pressure and respiratory rate, mildly elevated heart rate. Oxygen saturation is 94%, which is normal. Head is normocephalic and atraumatic. PERRLA, EOMI. Oropharynx is clear. Neck is nontender and supple without adenopathy. JVD is present. Back is nontender and there is no CVA tenderness. Lungs have distant tones with bibasilar rales and faint expiratory rhonchi.  No wheezes are appreciated. Chest is nontender. Heart has regular rate and rhythm without murmur. Abdomen is soft, flat, nontender without masses or hepatosplenomegaly and peristalsis is normoactive. Extremities have 2+  edema, full range of motion is present. Skin is warm and dry without rash. Neurologic: Awake but somewhat agitated, inconsistently follows commands, cranial nerves are intact, there are no motor or sensory deficits.  ED Treatments / Results  Labs (all labs ordered are listed, but only abnormal results are displayed) Labs Reviewed  BASIC METABOLIC PANEL - Abnormal; Notable for the following components:      Result Value   Potassium 6.9 (*)    CO2 19 (*)    Glucose, Bld 164 (*)    BUN 117 (*)    Creatinine, Ser 12.20 (*)    Calcium 7.4 (*)    GFR calc non Af Amer 3 (*)    GFR calc Af Amer 4 (*)    Anion gap 18 (*)    All other components within normal limits  CBC WITH DIFFERENTIAL/PLATELET -  Abnormal; Notable for the following components:   Hemoglobin 9.7 (*)    HCT 32.0 (*)    MCV 77.5 (*)    MCH 23.5 (*)    RDW 17.6 (*)    All other components within normal limits    EKG EKG Interpretation  Date/Time:  Thursday January 21 2018 03:58:51 EDT Ventricular Rate:  108 PR Interval:    QRS Duration: 86 QT Interval:  357 QTC Calculation: 479 R Axis:   52 Text Interpretation:  Sinus tachycardia Ventricular premature complex When compared with ECG of 01/07/2018, Premature ventricular complexes are now present Confirmed by Delora Fuel ([id number]) on 01/21/2018 4:21:58 AM   Radiology Dg Chest Port 1 View  Result Date: 01/21/2018 CLINICAL DATA:  Shortness of breath. EXAM: PORTABLE CHEST 1 VIEW COMPARISON:  01/07/2018 FINDINGS: Unchanged cardiomegaly. Slight worsening of pulmonary edema since prior exam. Patchy bibasilar opacities may be vascular or atelectasis. Suspect small right pleural effusion. No pneumothorax. IMPRESSION: Cardiomegaly with increased pulmonary edema since prior exam and possible right pleural effusion. Electronically Signed   By: Jeb Levering M.D.   On: 01/21/2018 04:23    Procedures Procedures  CRITICAL CARE Performed by: Delora Fuel Total critical care time:  120 minutes Critical care time was exclusive of separately billable procedures and treating other patients. Critical care was necessary to treat or prevent imminent or life-threatening deterioration. Critical care was time spent personally by me on the following activities: development of treatment plan with patient and/or surrogate as well as nursing, discussions with consultants, evaluation of patient's response to treatment, examination of patient, obtaining history from patient or surrogate, ordering and performing treatments and interventions, ordering and review of laboratory studies, ordering and review of radiographic studies, pulse oximetry and re-evaluation of patient's condition.  Medications Ordered in ED Medications  Chlorhexidine Gluconate Cloth 2 % PADS 6 each (has no administration in time range)  LORazepam (ATIVAN) injection 0.5 mg (0.5 mg Intravenous Given 01/21/18 0424)  calcium gluconate 1 g in sodium chloride 0.9 % 100 mL IVPB (1 g Intravenous New Bag/Given 01/21/18 0646)  dextrose 50 % solution 50 mL (50 mLs Intravenous Given 01/21/18 0644)  sodium bicarbonate injection 50 mEq (50 mEq Intravenous Given 01/21/18 0638)  albuterol (PROVENTIL) (2.5 MG/3ML) 0.083% nebulizer solution 5 mg (5 mg Nebulization Given by Other 01/21/18 0254)  insulin aspart (novoLOG) injection 10 Units (10 Units Intravenous Given 01/21/18 2706)     Initial Impression / Assessment and Plan / ED Course  I have reviewed the triage vital signs and the nursing notes.  Pertinent labs & imaging results that were available during my care of the patient were reviewed by me and considered in my medical decision making (see chart for details).  Respiratory distress secondary to fluid overload secondary to noncompliance with dialysis.  Old records are reviewed, and she has been admitted to the hospital twice in the last month with similar presentations.  Screening labs are obtained.  She will need to have emergent  dialysis.  Chest x-ray is consistent with early pulmonary edema.  ECG shows no changes suggestive of hyperkalemia.  Case is discussed with Dr. Augustin Coupe of nephrology service who agrees to do emergent dialysis.  Calcium is come back very high at 6.9.  This is treated emergently with dextrose, insulin, calcium, sodium bicarbonate, albuterol.  Dialysis is still pending.  Final Clinical Impressions(s) / ED Diagnoses   Final diagnoses:  Acute pulmonary edema (HCC)  Hyperkalemia  End-stage renal disease on hemodialysis (HCC)  Anemia  associated with chronic renal failure  Noncompliance of patient with dietary regimen    ED Discharge Orders    None       Delora Fuel, MD 96/88/64 [phone]

## 2018-01-21 NOTE — Progress Notes (Signed)
Bilateral lower extremity venous duplex completed - Preliminary results - There is no evidence of a DVT or Baker's cyst. Toma Copier, RVS 01/21/2018 4:50 PM

## 2018-01-21 NOTE — Progress Notes (Signed)
CRITICAL VALUE ALERT  Critical Value: Troponin 0.04  Date & Time Notied:  01/21/18 2259  Provider Notified: Frederik Pear   Orders Received/Actions taken:

## 2018-01-21 NOTE — Progress Notes (Signed)
Pt LUA fistula bleeding. Notified Renal physician on call. Changed her dressing, bleeding is very minimum will continue to monitor.

## 2018-01-21 NOTE — Progress Notes (Signed)
RT placed pt on BIPAP upon arrival to ED. Settings 15/5, 40% FIO2 with sat of 94%. Pt tolerating mask and BIPAP. RT will continue to monitor.
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
## 2018-01-21 NOTE — H&P (Signed)
[facility] History and Physical  Jayleah Garbers UTM:[id number] DOB: 1965-10-26 DOA: 01/21/2018  Referring physician:  PCP: [name], [name]   Chief Complaint:  Shortness of breath  HPI:   52 year old female with a history of end-stage renal disease , CHF, on home oxygen 2 L at baseline, hypertension, history of PE who presents to the ED today via EMS , after missing hemodialysis Tuesday  . She complained of shortness of breath and chest pain. Oxygen saturation was 78% on room air. She was placed on BiPAP and sent to inpatient hemodialysis for emergent dialysis. Patient has a very poor historian but states that she missed 2 sessions of dialysis , and she did not have a ride  .patient also complained of left lower extremity pain for the last 2 days. Patient also stated that she's had some nausea vomiting and diarrhea for the last 2 days   ED course BP (!) 217/97 (BP Location: Right Arm)   Pulse (!) 105   Temp 98.3 F (36.8 C) (Temporal)   Resp (!) 28   LMP 10/18/2009 (Within Months) Comment: ablation  SpO2 94%  Potassium of 7.2,hemoglobin 9.7 Chest x-ray showed Cardiomegaly with increased pulmonary edema since prior exam and possible right pleural effusion. Patient had 4 L removed during hemodialysis and was weaned off of BiPAP to nasal cannula Currently on 4 L. She was also noted to have left lower extremity swelling Patient is being admitted for several reasons primarily for her hypoxic respiratory failure, ongoing dialysis, left leg swelling, possible DVT, hypertensive urgency    Review of Systems: negative for the following   complete review of systems was done with pertinent positives as documented in history of present illness     Past Medical History:  Diagnosis Date  . Anemia   . Anxiety   . Asthma   . CHF (congestive heart failure) ([place])   . Coronary artery disease   . Daily headache   . Depression   . ESRD (end stage renal disease) on dialysis  ([place])    "[facility]; TTS" (10/19/2017)  . High cholesterol   . History of blood transfusion 10/19/2017   "anemia"  . Hyperkalemia 10/2017  . Hypertension   . On home oxygen therapy    "2L; daily" (10/19/2017)  . Pneumonia    "several times" (10/19/2017)  . Pulmonary embolism ([place]) 2012  . Type 1 diabetes mellitus ([place])      Past Surgical History:  Procedure Laterality Date  . AV FISTULA PLACEMENT Left 2018  . CESAREAN SECTION  1989  . CORONARY ANGIOPLASTY WITH STENT PLACEMENT  ~ 08/2017   "3 stents" (10/19/2017)  . ENDOMETRIAL ABLATION  ~ 2011      Social History:  reports that she has never smoked. She has never used smokeless tobacco. She reports that she does not drink alcohol or use drugs.    Allergies  Allergen Reactions  . Adhesive [Tape] Other (See Comments)    "RIPS MY SKIN," SO PLEASE USE COBAN WRAP!!  . Hydrocodone-Acetaminophen Other (See Comments)    "Makes me feel crazy"  . Metformin And Related Other (See Comments)    "Makes me feel crazy"    Family History  Problem Relation Age of Onset  . Stroke Sister          Prior to Admission medications   Medication Sig Start Date End Date Taking? Authorizing Provider  albuterol (PROAIR HFA) 108 (90 Base) MCG/ACT inhaler Inhale 2 puffs into the lungs every 6 (  six) hours as needed for wheezing or shortness of breath. 12/04/17   Roxan Hockey, MD  amLODipine (NORVASC) 10 MG tablet Take 1 tablet (10 mg total) by mouth daily. Patient taking differently: Take 5 mg by mouth daily.  12/24/17 01/23/18  Alma Friendly, MD  aspirin EC 81 MG tablet Take 1 tablet (81 mg total) by mouth daily. With food 12/04/17   Roxan Hockey, MD  calcium acetate (PHOSLO) 667 MG capsule Take 1 capsule (667 mg total) by mouth 3 (three) times daily with meals. 12/04/17   Roxan Hockey, MD  carvedilol (COREG) 6.25 MG tablet Take 1 tablet (6.25 mg total) by mouth 2 (two) times daily with a meal. Patient not taking: Reported on 01/08/2018  12/04/17   Roxan Hockey, MD  clopidogrel (PLAVIX) 75 MG tablet Take 1 tablet (75 mg total) by mouth daily. 12/04/17   Roxan Hockey, MD  escitalopram (LEXAPRO) 10 MG tablet Take 1 tablet (10 mg total) by mouth daily. 12/04/17   Roxan Hockey, MD  gabapentin (NEURONTIN) 300 MG capsule Take 1 capsule (300 mg total) by mouth at bedtime. 12/04/17   Roxan Hockey, MD  insulin lispro (HUMALOG) 100 UNIT/ML injection Inject 0.02 mLs (2 Units total) into the skin 3 (three) times daily before meals. As long as Blood sugar is >  110 12/04/17   Emokpae, Courage, MD  latanoprost (XALATAN) 0.005 % ophthalmic solution Place 1 drop into both eyes at bedtime. 12/04/17   Roxan Hockey, MD  multivitamin (RENA-VIT) TABS tablet Take 1 tablet by mouth at bedtime. 12/04/17   Roxan Hockey, MD  ondansetron (ZOFRAN) 4 MG tablet Take 1 tablet (4 mg total) by mouth every 8 (eight) hours as needed for nausea or vomiting. 12/04/17 12/04/18  Roxan Hockey, MD  sevelamer carbonate (RENVELA) 800 MG tablet Take 3 tablets (2,400 mg total) by mouth 3 (three) times daily with meals. 01/10/18   Robbie Lis, MD     Physical Exam: Vitals:   01/21/18 1349 01/21/18 1354 01/21/18 1410 01/21/18 1439  BP: (!) 222/108 (!) 222/108 (!) 181/87 (!) 175/79  Pulse: 94  98 96  Resp: 14   15  Temp: 97.9 F (36.6 C)     TempSrc: Axillary     SpO2: 100%   100%        Vitals:   01/21/18 1349 01/21/18 1354 01/21/18 1410 01/21/18 1439  BP: (!) 222/108 (!) 222/108 (!) 181/87 (!) 175/79  Pulse: 94  98 96  Resp: 14   15  Temp: 97.9 F (36.6 C)     TempSrc: Axillary     SpO2: 100%   100%   Constitutional: NAD, calm, comfortable Eyes: PERRL, lids and conjunctivae normal ENMT: Mucous membranes are moist. Posterior pharynx clear of any exudate or lesions.Normal dentition.  Neck: normal, supple, no masses, no thyromegaly Respiratory: clear to auscultation bilaterally, no wheezing, no crackles. Normal respiratory effort. No accessory  muscle use.  Cardiovascular: Regular rate and rhythm, no murmurs / rubs / gallops. . 2+ pedal pulses. No carotid bruits.  Abdomen: no tenderness, no masses palpated. No hepatosplenomegaly. Bowel sounds positive.  Musculoskeletal: no clubbing / cyanosis. No joint deformity upper and lower extremities. Good ROM, no contractures. Normal muscle tone. Left lower extremity swelling. Skin: no rashes, lesions, ulcers. No induration Neurologic: CN 2-12 grossly intact. Sensation intact, DTR normal. Strength 5/5 in all 4.  Psychiatric: Normal judgment and insight. Alert and oriented x 3. Normal mood.     Labs on Admission: I have  personally reviewed following labs and imaging studies  CBC: Recent Labs  Lab 01/21/18 0406  WBC 6.7  NEUTROABS 4.6  HGB 9.7*  HCT 32.0*  MCV 77.5*  PLT 097    Basic Metabolic Panel: Recent Labs  Lab 01/21/18 0406 01/21/18 0954  NA 140 141  K 6.9* 7.2*  CL 103 104  CO2 19* 22  GLUCOSE 164* 85  BUN 117* 115*  CREATININE 12.20* 12.11*  CALCIUM 7.4* 7.4*  PHOS  --  10.3*    GFR: Estimated Creatinine Clearance: 6.6 mL/min (A) (by C-G formula based on SCr of 12.11 mg/dL (H)).  Liver Function Tests: Recent Labs  Lab 01/21/18 0954  ALBUMIN 3.3*   No results for input(s): LIPASE, AMYLASE in the last 168 hours. No results for input(s): AMMONIA in the last 168 hours.  Coagulation Profile: No results for input(s): INR, PROTIME in the last 168 hours. No results for input(s): DDIMER in the last 72 hours.  Cardiac Enzymes: No results for input(s): CKTOTAL, CKMB, CKMBINDEX, TROPONINI in the last 168 hours.  BNP (last 3 results) No results for input(s): PROBNP in the last 8760 hours.  HbA1C: No results for input(s): HGBA1C in the last 72 hours. Lab Results  Component Value Date   HGBA1C 7.5 (H) 11/10/2017     CBG: No results for input(s): GLUCAP in the last 168 hours.  Lipid Profile: No results for input(s): CHOL, HDL, LDLCALC, TRIG, CHOLHDL,  LDLDIRECT in the last 72 hours.  Thyroid Function Tests: No results for input(s): TSH, T4TOTAL, FREET4, T3FREE, THYROIDAB in the last 72 hours.  Anemia Panel: No results for input(s): VITAMINB12, FOLATE, FERRITIN, TIBC, IRON, RETICCTPCT in the last 72 hours.  Urine analysis:    Component Value Date/Time   COLORURINE YELLOW 01/08/2018 1132   APPEARANCEUR HAZY (A) 01/08/2018 1132   LABSPEC 1.011 01/08/2018 1132   PHURINE 8.0 01/08/2018 1132   GLUCOSEU >=500 (A) 01/08/2018 1132   HGBUR SMALL (A) 01/08/2018 1132   BILIRUBINUR NEGATIVE 01/08/2018 1132   KETONESUR NEGATIVE 01/08/2018 1132   PROTEINUR >=300 (A) 01/08/2018 1132   NITRITE NEGATIVE 01/08/2018 1132   LEUKOCYTESUR NEGATIVE 01/08/2018 1132    Sepsis Labs: @LABRCNTIP (procalcitonin:4,lacticidven:4) )No results found for this or any previous visit (from the past 240 hour(s)).       Radiological Exams on Admission: Dg Chest Port 1 View  Result Date: 01/21/2018 CLINICAL DATA:  Shortness of breath. EXAM: PORTABLE CHEST 1 VIEW COMPARISON:  01/07/2018 FINDINGS: Unchanged cardiomegaly. Slight worsening of pulmonary edema since prior exam. Patchy bibasilar opacities may be vascular or atelectasis. Suspect small right pleural effusion. No pneumothorax. IMPRESSION: Cardiomegaly with increased pulmonary edema since prior exam and possible right pleural effusion. Electronically Signed   By: Jeb Levering M.D.   On: 01/21/2018 04:23   Dg Chest 2 View  Result Date: 12/22/2017 CLINICAL DATA:  Chest pain. EXAM: CHEST - 2 VIEW COMPARISON:  Radiographs of December 03, 2017. FINDINGS: Stable cardiomegaly with central pulmonary vascular congestion is noted. No pneumothorax or pleural effusion is noted. No consolidative process is noted. Bony thorax is unremarkable. IMPRESSION: Stable cardiomegaly with central pulmonary vascular congestion. Electronically Signed   By: Marijo Conception, M.D.   On: 12/22/2017 18:44   Ct Head Wo Contrast  Result  Date: 01/07/2018 CLINICAL DATA:  Altered mental status. EXAM: CT HEAD WITHOUT CONTRAST TECHNIQUE: Contiguous axial images were obtained from the base of the skull through the vertex without intravenous contrast. COMPARISON:  CT head dated December 22, 2017.  FINDINGS: Brain: No evidence of acute infarction, hemorrhage, hydrocephalus, extra-axial collection or mass lesion/mass effect. Vascular: Calcified atherosclerosis at the skullbase. No hyperdense vessel. Skull: Normal. Negative for fracture or focal lesion. Sinuses/Orbits: No acute finding. Unchanged bilateral maxillary sinus mucosal thickening. Other: None. IMPRESSION: 1.  No acute intracranial abnormality. Electronically Signed   By: Titus Dubin M.D.   On: 01/07/2018 16:03   Ct Head Wo Contrast  Result Date: 12/22/2017 CLINICAL DATA:  52 year old with altered level of consciousness. Dialysis patient. EXAM: CT HEAD WITHOUT CONTRAST TECHNIQUE: Contiguous axial images were obtained from the base of the skull through the vertex without intravenous contrast. COMPARISON:  10/18/2017 FINDINGS: Brain: No evidence for acute hemorrhage, mass lesion, midline shift, hydrocephalus or large infarct. Vascular: Atherosclerotic calcifications involving the bilateral vertebral arteries. Skull: Negative for fracture or focal lesion. Sinuses/Orbits: Mucosal disease in the maxillary sinuses. Other: None. IMPRESSION: No acute intracranial abnormality. Electronically Signed   By: Markus Daft M.D.   On: 12/22/2017 20:40   Ct Abdomen Pelvis W Contrast  Result Date: 12/22/2017 CLINICAL DATA:  Left-sided flank pain EXAM: CT ABDOMEN AND PELVIS WITH CONTRAST TECHNIQUE: Multidetector CT imaging of the abdomen and pelvis was performed using the standard protocol following bolus administration of intravenous contrast. CONTRAST:  12mL OMNIPAQUE IOHEXOL 300 MG/ML  SOLN COMPARISON:  10/18/2017 FINDINGS: Lower chest: No acute abnormality. Hepatobiliary: Multiple gallstones are noted  without complicating factors. The liver is within normal limits. Pancreas: Unremarkable. No pancreatic ductal dilatation or surrounding inflammatory changes. Spleen: Normal in size without focal abnormality. Adrenals/Urinary Tract: Adrenal glands are within normal limits. Kidneys demonstrate no renal calculi or urinary tract obstructive changes. Mild renal vascular calcifications are seen. The bladder is well distended. Stomach/Bowel: Stomach is within normal limits. Appendix appears normal. No evidence of bowel wall thickening, distention, or inflammatory changes. Vascular/Lymphatic: IVC filter is noted in place. No lymphadenopathy is identified. Diffuse vascular calcifications are noted without aneurysmal dilatation. Reproductive: Calcifications in the uterus are noted likely related to fibroid change. No adnexal mass is noted. Other: No abdominal wall hernia or abnormality. No abdominopelvic ascites. Musculoskeletal: No acute bony abnormality is noted. IMPRESSION: Cholelithiasis without complicating factors. Chronic changes as described above.  No acute abnormality noted. Electronically Signed   By: Inez Catalina M.D.   On: 12/22/2017 21:48   Dg Pelvis Portable  Result Date: 01/08/2018 CLINICAL DATA:  Acute onset of lower back pain. EXAM: PORTABLE PELVIS 1-2 VIEWS COMPARISON:  CT of the abdomen and pelvis from 12/22/2017 FINDINGS: There is no evidence of fracture or dislocation. Evaluation is mildly suboptimal due to patient rotation. Both femoral heads are seated normally within their respective acetabula. No significant degenerative change is appreciated. The sacroiliac joints are unremarkable in appearance. The visualized bowel gas pattern is grossly unremarkable in appearance. IMPRESSION: No evidence of fracture or dislocation. Electronically Signed   By: Garald Balding M.D.   On: 01/08/2018 03:35   Dg Chest Port 1 View  Result Date: 01/21/2018 CLINICAL DATA:  Shortness of breath. EXAM: PORTABLE CHEST 1  VIEW COMPARISON:  01/07/2018 FINDINGS: Unchanged cardiomegaly. Slight worsening of pulmonary edema since prior exam. Patchy bibasilar opacities may be vascular or atelectasis. Suspect small right pleural effusion. No pneumothorax. IMPRESSION: Cardiomegaly with increased pulmonary edema since prior exam and possible right pleural effusion. Electronically Signed   By: Jeb Levering M.D.   On: 01/21/2018 04:23   Dg Chest Port 1 View  Result Date: 01/07/2018 CLINICAL DATA:  AMS EXAM: PORTABLE CHEST 1 VIEW COMPARISON:  12/22/2017 FINDINGS: The heart is enlarged. There are prominent interstitial markings consistent with interstitial edema. No focal consolidations or overt alveolar edema. IMPRESSION: Cardiomegaly and interstitial pulmonary edema. Electronically Signed   By: Nolon Nations M.D.   On: 01/07/2018 15:36      EKG: Independently reviewed.   Assessment/Plan Acute on chronic diastolic heart failure   Respiratory failure (HCC) Shortness of breath due to missed dialysis Last known EF 60-65% 10/21/17 She has been weaned off  BiPAP but still needing 4 L via nasal cannula Had 4 L removed today,She may need hemodialysis again tomorrow  Left lower extremity swelling She has a prior history of PE currently not on anticoagulation Venous Doppler ordered and pending  ESRD on hemodialysis Tuesday Thursday Saturday Nephrology following Follow-up potassium  Hypertensive emergency due to missed hemodialysis Resume home medications and follow  Diabetes mellitus Will continue with sliding scale insulin for now, check hemoglobin A1c  Coronary artery disease the patient is on aspirin and Plavix which will be continued  Nausea vomiting diarrhea Suspect due to azotemia If she has diarrhea will obtain stool studies     DVT prophylaxis heparin     Code Status Orders full  (From admission, onward)       consults called:  Family Communication: Admission, patients condition and plan of  care including tests being ordered have been discussed with the patient  who indicates understanding and agree with the plan and Code Status   Admission status:  The appropriate patient status for this patient is INPATIENT. Inpatient status is judged to be reasonable and necessary in order to provide the required intensity of service to ensure the patient's safety. The patient's presenting symptoms, physical exam findings, and initial radiographic and laboratory data in the context of their chronic comorbidities is felt to place them at high risk for further clinical deterioration. Furthermore, it is not anticipated that the patient will be medically stable for discharge from the hospital within 2 midnights of admission. The following factors support the patient status of inpatient.    "           The patient's presenting symptoms acute hypoxic respiratory failure, need for urgent hemodialysis,need for ongoing hemodialysis, hyperkalemia, left lower extremity swelling possible DVT        * I certify that at the point of admission it is my clinical judgment that the patient will require inpatient hospital care spanning beyond 2 midnights from the point of admission due to high intensity of service, high risk for further deterioration and high frequency of surveillance required.*    Disposition plan: Further plan will depend as patient's clinical course evolves and further radiologic and laboratory data become available. Likely home when stable    At the time of admission, it appears that the appropriate admission status for this patient is INPATIENT . This is judged to be reasonable and necessary in order to provide the required intensity of service to ensure the patient's safety given the presenting symptoms, physical exam findings, and initial radiographic and laboratory data in the context of their chronic comorbidities.   Reyne Dumas MD [facility] Pager [phone]  If 7PM-7AM,  please contact night-coverage [email or web address] Password [facility]  01/21/2018, 3:56 PM

## 2018-01-21 NOTE — ED Triage Notes (Signed)
Pt comes via [facility] from home, dialysis pt, T. Thu, Sat, missed Tues treatment. Silent chest upon EMS arrival, placed on CPAP, rales in lower lobes. RA stat 78%

## 2018-01-21 NOTE — Progress Notes (Signed)
Pt complaining of chest pressure, EKG negative. Notified physician on call. Pt states it could be indigestion as she has this often. Ordered Maalox. Will continue to monitor pt.

## 2018-01-21 NOTE — Procedures (Signed)
   I was present at this dialysis session, have reviewed the session itself and made  appropriate changes Kelly Splinter MD [place] pager [phone]   01/21/2018, 1:49 PM

## 2018-01-22 ENCOUNTER — Inpatient Hospital Stay (HOSPITAL_COMMUNITY): Payer: Medicaid Other

## 2018-01-22 DIAGNOSIS — R0602 Shortness of breath: Secondary | ICD-10-CM | POA: Diagnosis present

## 2018-01-22 LAB — COMPREHENSIVE METABOLIC PANEL
ALK PHOS: 121 U/L (ref 38–126)
ALT: 22 U/L (ref 0–44)
ANION GAP: 12 (ref 5–15)
AST: 10 U/L — ABNORMAL LOW (ref 15–41)
Albumin: 3.2 g/dL — ABNORMAL LOW (ref 3.5–5.0)
BUN: 50 mg/dL — ABNORMAL HIGH (ref 6–20)
CALCIUM: 7.7 mg/dL — AB (ref 8.9–10.3)
CO2: 25 mmol/L (ref 22–32)
Chloride: 99 mmol/L (ref 98–111)
Creatinine, Ser: 6.54 mg/dL — ABNORMAL HIGH (ref 0.44–1.00)
GFR, EST AFRICAN AMERICAN: 8 mL/min — AB (ref >60)
GFR, EST NON AFRICAN AMERICAN: 7 mL/min — AB (ref >60)
Glucose, Bld: 243 mg/dL — ABNORMAL HIGH (ref 70–99)
Potassium: 4.7 mmol/L (ref 3.5–5.1)
Sodium: 136 mmol/L (ref 135–145)
Total Bilirubin: 0.7 mg/dL (ref 0.3–1.2)
Total Protein: 6.8 g/dL (ref 6.5–8.1)

## 2018-01-22 LAB — TROPONIN I
TROPONIN I: 0.04 ng/mL — AB (ref <0.03)
Troponin I: 0.04 ng/mL (ref <0.03)

## 2018-01-22 LAB — GLUCOSE, CAPILLARY
GLUCOSE-CAPILLARY: 165 mg/dL — AB (ref 70–99)
GLUCOSE-CAPILLARY: 75 mg/dL (ref 70–99)
Glucose-Capillary: 104 mg/dL — ABNORMAL HIGH (ref 70–99)
Glucose-Capillary: 133 mg/dL — ABNORMAL HIGH (ref 70–99)

## 2018-01-22 LAB — CBC
HCT: 31.5 % — ABNORMAL LOW (ref 36.0–46.0)
HEMOGLOBIN: 9.5 g/dL — AB (ref 12.0–15.0)
MCH: 23.3 pg — ABNORMAL LOW (ref 26.0–34.0)
MCHC: 30.2 g/dL (ref 30.0–36.0)
MCV: 77.4 fL — ABNORMAL LOW (ref 78.0–100.0)
PLATELETS: 182 10*3/uL (ref 150–400)
RBC: 4.07 MIL/uL (ref 3.87–5.11)
RDW: 17.4 % — ABNORMAL HIGH (ref 11.5–15.5)
WBC: 4.5 10*3/uL (ref 4.0–10.5)

## 2018-01-22 MED ORDER — HEPARIN SODIUM (PORCINE) 1000 UNIT/ML DIALYSIS: 4000.0000 [IU] | Freq: Once | INTRAMUSCULAR | Status: DC

## 2018-01-22 MED ORDER — ACETAMINOPHEN 325 MG PO TABS
ORAL_TABLET | ORAL | Status: AC
  Administered 2018-01-22: 13:00:00
  Filled 2018-01-22: qty 2

## 2018-01-22 MED ORDER — NEPRO/CARBSTEADY PO LIQD
237.0000 mL | Freq: Two times a day (BID) | ORAL | Status: DC
  Filled 2018-01-22 (×6): qty 237

## 2018-01-22 NOTE — Progress Notes (Signed)
[facility] Progress Note  Subjective: no c/o, on HD now.  Says that "[employer]" transportation has not been picking her up for all her HD sessions.  She lives in an apt in [place].  Breathing better, no new c/o.   Vitals:   01/22/18 0845 01/22/18 0915 01/22/18 0945 01/22/18 1015  BP: 128/67 (!) 146/77 (!) 179/87 (!) 145/75  Pulse: 86 85 85 85  Resp:      Temp:      TempSrc:      SpO2:   100%   Weight:      Height:        Inpatient medications: . acetaminophen      . amLODipine  5 mg Oral Daily  . aspirin EC  81 mg Oral Q breakfast  . calcium acetate  667 mg Oral TID WC  . carvedilol  6.25 mg Oral BID WC  . Chlorhexidine Gluconate Cloth  6 each Topical Q0600  . Chlorhexidine Gluconate Cloth  6 each Topical Q0600  . clopidogrel  75 mg Oral Daily  . escitalopram  10 mg Oral Daily  . gabapentin  300 mg Oral QHS  . [START ON 01/23/2018] heparin  4,000 Units Dialysis Once in dialysis  . heparin  5,000 Units Subcutaneous Q8H  . insulin aspart  0-15 Units Subcutaneous TID WC  . insulin aspart  2 Units Subcutaneous TID WC  . latanoprost  1 drop Both Eyes QHS  . sevelamer carbonate  2,400 mg Oral TID WC    acetaminophen **OR** acetaminophen, albuterol, alum & mag hydroxide-simeth, hydrALAZINE, ondansetron **OR** ondansetron (ZOFRAN) IV  Iron/TIBC/Ferritin/ %Sat No results found for: IRON, TIBC, FERRITIN, IRONPCTSAT  Exam: Gen on HD, no distress, calm Chest clear bilat RRR no MRG Abd soft ntnd no mass or ascites +bs obese GU defer MS no joint effusions or deformity Ext 1+ L pedal edema, no wounds or ulcers Neuro is alert, Ox 3 , nf    Home meds:  - amlodipine 10 qd/ carvedilol 6.25 bid  - ecasa 81 qd/ clopidogrel 75 qd  - escitalopram 10 qd/ gabapentin 300 hs/ prn zofran  - insulin lispro 2u tid ac if bs > 110  - alb HFA prn  - calcium acetate 1 tid ac/ sevelamer carb 2400 tid ac  Dialysis: TTS SW  4h   94.5kg  2/2.25 bath  LUA AVF  Hep 4000  - hect 1  ug  - completed venofer 100mg  x 10 on 12/10/17  - last mircera 100 on 7/11, last Hb 8.7 on 7/30     Impression: 1. Resp distress/ pulm edema / vol overload - patient missed multiple recent HD sessions. Says the transport company hasn't been picking her up. We will have our SW at the outpt HD unit look into this.  Clinically after HD today pt is ready for dc from renal standpoint. 2. ESRD on HD TTS - started in [place] early 2018.  3. HTN urgency - BP's better, getting home meds, getting vol down  4. Depression - recent admit for SI, resolved and seen by psych, see there note 5. DM on insulin 6. MBD ckd - cont binders, vdra 7. Anemia ckd - Hb 9.7, no worse than at OP center  Plan - as above   Kelly Splinter MD [place] pager [phone]   01/22/2018, 10:52 AM   Recent Labs  Lab 01/21/18 0954 01/22/18 0241  NA 141 136  K 7.2* 4.7  CL 104 99  CO2 22  25  GLUCOSE 85 243*  BUN 115* 50*  CREATININE 12.11* 6.54*  CALCIUM 7.4* 7.7*  PHOS 10.3*  --   ALBUMIN 3.3* 3.2*   Recent Labs  Lab 01/22/18 0241  AST 10*  ALT 22  ALKPHOS 121  BILITOT 0.7  PROT 6.8   Recent Labs  Lab 01/21/18 0406 01/22/18 0241  WBC 6.7 4.5  NEUTROABS 4.6  --   HGB 9.7* 9.5*  HCT 32.0* 31.5*  MCV 77.5* 77.4*  PLT 170 182

## 2018-01-22 NOTE — Discharge Summary (Deleted)
Physician Discharge Summary  Lauren Warner ZES:[id number] DOB: Nov 17, 1965 DOA: 01/21/2018  PCP: Lauren Lacrosse, [name]  Admit date: 01/21/2018 Discharge date: 01/22/2018  Admitted From: Home Disposition:  Home  Discharge Condition:Stable CODE STATUS:FULL Diet recommendation: Heart Healthy  Brief/Interim Summary: Admission H and P: 52 year old female with a history of end-stage renal disease , CHF, on home oxygen 2 L at baseline, hypertension, history of PE who presents to the ED today via EMS , after missing hemodialysis Tuesday  . She complained of shortness of breath and chest pain. Oxygen saturation was 78% on room air. She was placed on BiPAP and sent to inpatient hemodialysis for emergent dialysis. Patient has a very poor historian but states that she missed 2 sessions of dialysis , and she did not have a ride  .patient also complained of left lower extremity pain for the last 2 days. Patient also stated that she's had some nausea vomiting and diarrhea for the last 2 days   ED course BP (!) 217/97 (BP Location: Right Arm)  Pulse (!) 105  Temp 98.3 F (36.8 C) (Temporal)  Resp (!) 28  LMP 10/18/2009 (Within Months) Comment: ablation  SpO2 94%  Potassium of 7.2,hemoglobin 9.7 Chest x-ray showed Cardiomegaly with increased pulmonary edema since prior exam and possible right pleural effusion. Patient had 4 L removed during hemodialysis and was weaned off of BiPAP to nasal cannula Currently on 4 L. She was also noted to have left lower extremity swelling Patient is being admitted for several reasons primarily for her hypoxic respiratory failure, ongoing dialysis, left leg swelling, possible DVT, hypertensive urgency   Hospital Course:  Patient's hospital course remained stable.  Her chest pain and shortness of breath resolved after dialysis.  She underwent 2 sessions of dialysis.  This morning she was found to be comfortable .  Denies any complaints.  Hemodynamically  stable.  Nephrology has cleared her for discharge.  Patient is stable for discharge home today.  Following problems were addressed during her hospitalization:  Acute  respiratory failure : Presented with volume overload because she missed 2 sessions of dialysis.  The transport company did not pick her up.  Her respiratory status stable this morning after 2 sessions of dialysis. Nephrology cleared her for discharge.  Left lower extremity swelling: She has a prior history of PE currently not on anticoagulation Venous Doppler negative for DVT.  X-ray of the left ankle done which showed unchanged  dorsal navicular and anterior talus fragmentation when compared to May 2019, soft tissue swelling.  No need for acute intervention.  ESRD on hemodialysis:Dilaysed on  Tuesday Thursday Saturday  Hypertensive emergency : BP improved.Resume home medications  Diabetes mellitus: Continue home regimen  Coronary artery disease :The patient is on aspirin and Plavix which will be continued  Nausea /vomiting /diarrhea: Resolved  Discharge Diagnoses:  Principal Problem:   Respiratory failure ([place]) Active Problems:   Acute respiratory failure with hypoxia ([facility])   Essential hypertension   CAD (coronary artery disease)   ESRD (end stage renal disease) ([place])   DM (diabetes mellitus), type 2 with renal complications ([facility])   Hyperkalemia   SOB (shortness of breath)    Discharge Instructions  Discharge Instructions    Diet - low sodium heart healthy   Complete by:  As directed    Discharge instructions   Complete by:  As directed    1) Follow up with your PCP in a week.  Continue your home medications.  Undergoing dialysis on regular sessions  as instructed.   Increase activity slowly   Complete by:  As directed    Increase activity slowly   Complete by:  As directed      Allergies as of 01/22/2018      Reactions   Adhesive [tape] Other (See Comments)   "RIPS MY SKIN," SO PLEASE USE  COBAN WRAP!!   Hydrocodone-acetaminophen Other (See Comments)   "Makes me feel crazy"   Metformin And Related Other (See Comments)   "Makes me feel crazy"      Medication List    TAKE these medications   albuterol 108 (90 Base) MCG/ACT inhaler Commonly known as:  PROVENTIL HFA;VENTOLIN HFA Inhale 2 puffs into the lungs every 6 (six) hours as needed for wheezing or shortness of breath.   amLODipine 10 MG tablet Commonly known as:  NORVASC Take 1 tablet (10 mg total) by mouth daily. What changed:  how much to take   aspirin EC 81 MG tablet Take 1 tablet (81 mg total) by mouth daily. With food   calcium acetate 667 MG capsule Commonly known as:  PHOSLO Take 1 capsule (667 mg total) by mouth 3 (three) times daily with meals.   carvedilol 6.25 MG tablet Commonly known as:  COREG Take 1 tablet (6.25 mg total) by mouth 2 (two) times daily with a meal.   clopidogrel 75 MG tablet Commonly known as:  PLAVIX Take 1 tablet (75 mg total) by mouth daily.   escitalopram 10 MG tablet Commonly known as:  LEXAPRO Take 1 tablet (10 mg total) by mouth daily.   gabapentin 300 MG capsule Commonly known as:  NEURONTIN Take 1 capsule (300 mg total) by mouth at bedtime.   insulin lispro 100 UNIT/ML injection Commonly known as:  HUMALOG Inject 0.02 mLs (2 Units total) into the skin 3 (three) times daily before meals. As long as Blood sugar is >  110   latanoprost 0.005 % ophthalmic solution Commonly known as:  XALATAN Place 1 drop into both eyes at bedtime.   multivitamin Tabs tablet Take 1 tablet by mouth at bedtime.   ondansetron 4 MG tablet Commonly known as:  ZOFRAN Take 1 tablet (4 mg total) by mouth every 8 (eight) hours as needed for nausea or vomiting.   sevelamer carbonate 800 MG tablet Commonly known as:  RENVELA Take 3 tablets (2,400 mg total) by mouth 3 (three) times daily with meals.      Follow-up Information    Lauren Humble Brennan Bailey, [name]. Schedule an  appointment as soon as possible for a visit in 1 week(s).   Specialty:  Internal Medicine Contact information: [address] [place] [address] [phone]          Allergies  Allergen Reactions  . Adhesive [Tape] Other (See Comments)    "RIPS MY SKIN," SO PLEASE USE COBAN WRAP!!  . Hydrocodone-Acetaminophen Other (See Comments)    "Makes me feel crazy"  . Metformin And Related Other (See Comments)    "Makes me feel crazy"    Consultations: Nephrology  Procedures/Studies: Dg Ankle 2 Views Left  Result Date: 01/22/2018 CLINICAL DATA:  Atraumatic left ankle pain and swelling EXAM: LEFT ANKLE - 2 VIEW COMPARISON:  10/18/2017 FINDINGS: Chronic fragmentation of the dorsal navicular and anterior talus with regional soft tissue swelling. No progressive progression as expected for osteomyelitis. Charcot joint is not excluded in this patient with diabetes. There is osteopenia and atherosclerosis. IMPRESSION: 1. Similar-appearing dorsal navicular and anterior talus fragmentation when compared  to May 2019. Lack of progression argues against infection, but Charcot joint is still considered. 2. Extensive soft tissue swelling. Electronically Signed   By: [name]   On: 01/22/2018 14:04   Ct Head Wo Contrast  Result Date: 01/07/2018 CLINICAL DATA:  Altered mental status. EXAM: CT HEAD WITHOUT CONTRAST TECHNIQUE: Contiguous axial images were obtained from the base of the skull through the vertex without intravenous contrast. COMPARISON:  CT head dated December 22, 2017. FINDINGS: Brain: No evidence of acute infarction, hemorrhage, hydrocephalus, extra-axial collection or mass lesion/mass effect. Vascular: Calcified atherosclerosis at the skullbase. No hyperdense vessel. Skull: Normal. Negative for fracture or focal lesion. Sinuses/Orbits: No acute finding. Unchanged bilateral maxillary sinus mucosal thickening. Other: None. IMPRESSION: 1.  No acute intracranial abnormality.  Electronically Signed   By: Titus Dubin M.D.   On: 01/07/2018 16:03   Dg Pelvis Portable  Result Date: 01/08/2018 CLINICAL DATA:  Acute onset of lower back pain. EXAM: PORTABLE PELVIS 1-2 VIEWS COMPARISON:  CT of the abdomen and pelvis from 12/22/2017 FINDINGS: There is no evidence of fracture or dislocation. Evaluation is mildly suboptimal due to patient rotation. Both femoral heads are seated normally within their respective acetabula. No significant degenerative change is appreciated. The sacroiliac joints are unremarkable in appearance. The visualized bowel gas pattern is grossly unremarkable in appearance. IMPRESSION: No evidence of fracture or dislocation. Electronically Signed   By: Garald Balding M.D.   On: 01/08/2018 03:35   Dg Chest Port 1 View  Result Date: 01/21/2018 CLINICAL DATA:  Shortness of breath. EXAM: PORTABLE CHEST 1 VIEW COMPARISON:  01/07/2018 FINDINGS: Unchanged cardiomegaly. Slight worsening of pulmonary edema since prior exam. Patchy bibasilar opacities may be vascular or atelectasis. Suspect small right pleural effusion. No pneumothorax. IMPRESSION: Cardiomegaly with increased pulmonary edema since prior exam and possible right pleural effusion. Electronically Signed   By: Jeb Levering M.D.   On: 01/21/2018 04:23   Dg Chest Port 1 View  Result Date: 01/07/2018 CLINICAL DATA:  AMS EXAM: PORTABLE CHEST 1 VIEW COMPARISON:  12/22/2017 FINDINGS: The heart is enlarged. There are prominent interstitial markings consistent with interstitial edema. No focal consolidations or overt alveolar edema. IMPRESSION: Cardiomegaly and interstitial pulmonary edema. Electronically Signed   By: Nolon Nations M.D.   On: 01/07/2018 15:36       Subjective: Patient seen and examined at bedside in the dialysis unit.  Currently she feels comfortable.  Denies any shortness of breath or chest pain.  Hemodynamically stable for discharge to home today.  Discharge Exam: Vitals:   01/22/18  1145 01/22/18 1303  BP: (!) 101/40 (!) 188/86  Pulse: 84 89  Resp: 18 18  Temp: 98.4 F (36.9 C) 99 F (37.2 C)  SpO2: 100% 100%   Vitals:   01/22/18 1045 01/22/18 1115 01/22/18 1145 01/22/18 1303  BP: (!) 102/59 (!) 104/50 (!) 101/40 (!) 188/86  Pulse: 83 85 84 89  Resp:   18 18  Temp:   98.4 F (36.9 C) 99 F (37.2 C)  TempSrc:   Oral Oral  SpO2:   100% 100%  Weight:   96.7 kg   Height:        General: Pt is alert, awake, not in acute distress Cardiovascular: RRR, S1/S2 +, no rubs, no gallops Respiratory: CTA bilaterally, no wheezing, no rhonchi Abdominal: Soft, NT, ND, bowel sounds + Extremities: no edema, no cyanosis    The results of significant diagnostics from this hospitalization (including imaging, microbiology, ancillary and laboratory)  are listed below for reference.     Microbiology: No results found for this or any previous visit (from the past 240 hour(s)).   Labs: BNP (last 3 results) No results for input(s): BNP in the last 8760 hours. Basic Metabolic Panel: Recent Labs  Lab 01/21/18 0406 01/21/18 0954 01/21/18 1957 01/22/18 0241  NA 140 141  --  136  K 6.9* 7.2*  --  4.7  CL 103 104  --  99  CO2 19* 22  --  25  GLUCOSE 164* 85  --  243*  BUN 117* 115*  --  50*  CREATININE 12.20* 12.11*  --  6.54*  CALCIUM 7.4* 7.4*  --  7.7*  MG  --   --  2.2  --   PHOS  --  10.3*  --   --    Liver Function Tests: Recent Labs  Lab 01/21/18 0954 01/22/18 0241  AST  --  10*  ALT  --  22  ALKPHOS  --  121  BILITOT  --  0.7  PROT  --  6.8  ALBUMIN 3.3* 3.2*   No results for input(s): LIPASE, AMYLASE in the last 168 hours. No results for input(s): AMMONIA in the last 168 hours. CBC: Recent Labs  Lab 01/21/18 0406 01/22/18 0241  WBC 6.7 4.5  NEUTROABS 4.6  --   HGB 9.7* 9.5*  HCT 32.0* 31.5*  MCV 77.5* 77.4*  PLT 170 182   Cardiac Enzymes: Recent Labs  Lab 01/21/18 1957 01/21/18 2252 01/22/18 0241  TROPONINI 0.04* 0.04* 0.04*    BNP: Invalid input(s): POCBNP CBG: Recent Labs  Lab 01/21/18 1656 01/21/18 2215 01/22/18 0705 01/22/18 1248  GLUCAP 115* 127* 165* 104*   D-Dimer No results for input(s): DDIMER in the last 72 hours. Hgb A1c Recent Labs    01/21/18 1957  HGBA1C 8.0*   Lipid Profile No results for input(s): CHOL, HDL, LDLCALC, TRIG, CHOLHDL, LDLDIRECT in the last 72 hours. Thyroid function studies Recent Labs    01/21/18 1957  TSH 1.661   Anemia work up No results for input(s): VITAMINB12, FOLATE, FERRITIN, TIBC, IRON, RETICCTPCT in the last 72 hours. Urinalysis    Component Value Date/Time   COLORURINE YELLOW 01/08/2018 1132   APPEARANCEUR HAZY (A) 01/08/2018 1132   LABSPEC 1.011 01/08/2018 1132   PHURINE 8.0 01/08/2018 1132   GLUCOSEU >=500 (A) 01/08/2018 1132   HGBUR SMALL (A) 01/08/2018 1132   BILIRUBINUR NEGATIVE 01/08/2018 1132   KETONESUR NEGATIVE 01/08/2018 1132   PROTEINUR >=300 (A) 01/08/2018 1132   NITRITE NEGATIVE 01/08/2018 1132   LEUKOCYTESUR NEGATIVE 01/08/2018 1132   Sepsis Labs Invalid input(s): PROCALCITONIN,  WBC,  LACTICIDVEN Microbiology No results found for this or any previous visit (from the past 240 hour(s)).  Please note: You were cared for by a hospitalist during your hospital stay. Once you are discharged, your primary care physician will handle any further medical issues. Please note that NO REFILLS for any discharge medications will be authorized once you are discharged, as it is imperative that you return to your primary care physician (or establish a relationship with a primary care physician if you do not have one) for your post hospital discharge needs so that they can reassess your need for medications and monitor your lab values.    Time coordinating discharge: 40 minutes  SIGNED:   Shelly Coss, MD  [facility] 01/22/2018, 3:02 PM Pager [phone]  If 7PM-7AM, please contact night-coverage [email or web address] Password [email or web address]

## 2018-01-22 NOTE — Significant Event (Signed)
It was reported that patient became unresponsive while undergoing physical therapy evaluation this afternoon.  Patient seen and examined the bedside. Currently she is back to her normal state.  She is completely alert and oriented.  Denies any shortness of breath or chest pain.   Complains of some nausea.  She does not remember exactly what happened.  I was reported that her systolic blood pressure dropped from 111mmHg to 120 mmHg.  Most likely she was orthostatic.  She had just undergone dialysis about an hour ago which could have triggered the orthostatic hypotension. She was dialysed yesterday too.  We will continue to monitor the patient.  I will order twelve-lead EKG. I will recommend to check his orthostatic vitals tomorrow morning.

## 2018-01-22 NOTE — Evaluation (Signed)
Physical Therapy Evaluation Patient Details Name: Lauren Warner MRN: [id number] DOB: [birth year] Today's Date: 01/22/2018   History of Present Illness  52 year old female with a history of end-stage renal disease , CHF, on home oxygen 2 L at baseline, hypertension, history of PE who presents to the ED today via EMS , after missing hemodialysis Tuesday  . She complained of shortness of breath and chest pain. Oxygen saturation was 78% on room air. She was placed on BiPAP and sent to inpatient hemodialysis for emergent dialysis.   Clinical Impression  PTA pt independent with limited community ambulation without AD, independent with iADLS, however reports difficulty with bathing and dressing. Pt reports that she has been approved for [place] but does not have one. Pt is limited in safe mobility by orthostatic hypotension (see General Comments) and L ankle swelling and pain. Pt currently requires min A for bed mobility, min guard for transfers and min A for ambulation of 10 feet with RW. Once medically stable PT recommends HHPT at discharge to improve strength and mobility. PT will continue to follow acutely.      Follow Up Recommendations Home health PT;Supervision - Intermittent    Equipment Recommendations  Rolling walker with 5" wheels;Other (comment)(L ankle brace )    Recommendations for Other Services       Precautions / Restrictions Precautions Precautions: Fall Precaution Comments: sycopal event  Restrictions Weight Bearing Restrictions: No      Mobility  Bed Mobility Overal bed mobility: Needs Assistance Bed Mobility: Supine to Sit;Sit to Supine     Supine to sit: Min guard;HOB elevated Sit to supine: Min assist   General bed mobility comments: min guard for safety to come to EoB, minA for management of LE back into bed   Transfers Overall transfer level: Needs assistance Equipment used: Rolling walker (2 wheeled) Transfers: Sit to/from Stand Sit to Stand: Min guard          General transfer comment: min guard for safety, good power up and steadying with RW  Ambulation/Gait Ambulation/Gait assistance: Min assist Gait Distance (Feet): 10 Feet Assistive device: Rolling walker (2 wheeled) Gait Pattern/deviations: Step-through pattern;Shuffle;Decreased stance time - left;Decreased step length - right;Decreased weight shift to left;Antalgic;Trunk flexed Gait velocity: slowed Gait velocity interpretation: <1.8 ft/sec, indicate of risk for recurrent falls General Gait Details: minA for steadying with RW, increased UE support on RW to decrease L LE weightbearing, vc for upright posture         Balance Overall balance assessment: Needs assistance Sitting-balance support: Feet supported;No upper extremity supported Sitting balance-Leahy Scale: Good     Standing balance support: No upper extremity supported Standing balance-Leahy Scale: Fair                               Pertinent Vitals/Pain Pain Assessment: 0-10 Pain Score: 8  Pain Location: L ankle  Pain Descriptors / Indicators: Sore;Throbbing;Aching Pain Intervention(s): Ice applied;Limited activity within patient's tolerance;Monitored during session;Premedicated before session    [place] expects to be discharged to:: Private residence Living Arrangements: Alone Available Help at Discharge: Other (Comment)(reports she has been approved for [facility] aide doesn't have yet) Type of Home: Apartment Home Access: Level entry     Home Layout: One level Home Equipment: Toilet riser;Bedside commode      Prior Function Level of Independence: Independent         Comments: independent with  Extremity/Trunk Assessment   Upper Extremity Assessment Upper Extremity Assessment: Generalized weakness    Lower Extremity Assessment Lower Extremity Assessment: LLE deficits/detail LLE Deficits / Details: L ankle swollen, prior fx, able to bear weight          Communication   Communication: No difficulties  Cognition Arousal/Alertness: Awake/alert Behavior During Therapy: WFL for tasks assessed/performed;Flat affect Overall Cognitive Status: No family/caregiver present to determine baseline cognitive functioning                                        General Comments General comments (skin integrity, edema, etc.): Prior to activity SaO2 on RA 100%, HR 80 bpm. Pt ambulated 10 feet, reported feeling nauseous and dizzy and requested to sit, pt sat in straight back chair and became unresponsive to sternal rub, yelling, for ~15 sec, SaO2 dropped to 88%O2 and 3 L supplemental O2 via [place] provided, pt roused. SaO2 quickly rebounded to 100%O2, HR 85 bpm, BP 122/76. Pt BP immediately after HD 188/86. MD notified of event and determined pt to be orthostatic.         Assessment/Plan    PT Assessment Patient needs continued PT services  PT Problem List Decreased strength;Decreased activity tolerance;Decreased mobility;Cardiopulmonary status limiting activity;Pain       PT Treatment Interventions DME instruction;Gait training;Functional mobility training;Therapeutic activities;Therapeutic exercise;Balance training;Cognitive remediation;Patient/family education    PT Goals (Current goals can be found in the Care Plan section)  Acute Rehab PT Goals Patient Stated Goal: have son care for her PT Goal Formulation: With patient Time For Goal Achievement: 02/05/18 Potential to Achieve Goals: Fair    Frequency Min 3X/week    AM-PAC PT "6 Clicks" Daily Activity  Outcome Measure Difficulty turning over in bed (including adjusting bedclothes, sheets and blankets)?: A Little Difficulty moving from lying on back to sitting on the side of the bed? : Unable Difficulty sitting down on and standing up from a chair with arms (e.g., wheelchair, bedside commode, etc,.)?: Unable Help needed moving to and from a bed to chair (including a wheelchair)?: A  Little Help needed walking in hospital room?: A Little Help needed climbing 3-5 steps with a railing? : Total 6 Click Score: 12    End of Session Equipment Utilized During Treatment: Gait belt;Oxygen Activity Tolerance: Treatment limited secondary to medical complications (Comment)(pt had syncopal event after becoming orthostatic) Patient left: in bed;with call bell/phone within reach;with bed alarm set Nurse Communication: Mobility status;Other (comment) PT Visit Diagnosis: Unsteadiness on feet (R26.81);Other abnormalities of gait and mobility (R26.89);Muscle weakness (generalized) (M62.81);Pain Pain - Right/Left: Left Pain - part of body: Ankle and joints of foot    Time: 1638-1710 PT Time Calculation (min) (ACUTE ONLY): 32 min   Charges:   PT Evaluation $PT Eval Moderate Complexity: 1 Mod PT Treatments $Gait Training: 8-22 mins        Lauren Warner B. Migdalia Dk PT, DPT Acute Rehabilitation  [phone] Pager [phone]    [place] 01/22/2018, 5:35 PM

## 2018-01-22 NOTE — Progress Notes (Signed)
PROGRESS NOTE    Lauren Warner  BWL:[id number] DOB: Apr 04, 1966 DOA: 01/21/2018 PCP: [name], [name]   Brief Narrative: Patient is a 52 year old female with a history of end-stage renal disease , CHF, on home oxygen 2 L at baseline, hypertension, history of PE who presents to the ED today via EMS , after missing hemodialysis Tuesday . She complained of shortness of breath and chest pain. Oxygen saturation was 78% on room air. She was placed on BiPAP and sent to inpatient hemodialysis for emergent dialysis. Patient has a very poor historian but states that she missed 2 sessions of dialysis , and she did not have a ride .patient also complained of left lower extremity pain for the last 2 days. Patient also stated that she's had some nausea vomiting and diarrhea for the last 2 days.  Assessment & Plan:   Principal Problem:   Respiratory failure ([place]) Active Problems:   Acute respiratory failure with hypoxia (HCC)   Essential hypertension   CAD (coronary artery disease)   ESRD (end stage renal disease) ([place])   DM (diabetes mellitus), type 2 with renal complications (HCC)   Hyperkalemia   SOB (shortness of breath)   Acute  respiratory failure : Presented with volume overload because she missed 2 sessions of dialysis.  The transport company did not pick her up.  Her respiratory status has improved this morning after 2 sessions of dialysis. Nephrology cleared her for discharge.  She was still requiring oxygen at 3 L/min today after dialysis so could not be discharged.She is not on oxygen at home.  Will continue to try weaning the oxygen off.  Left lower extremity swelling/ankle pain: She has a prior history of PE currently not on anticoagulation Venous Doppler negative for DVT.  X-ray of the left ankle done which showed unchanged  dorsal navicular and anterior talus fragmentation when compared to May 2019, soft tissue swelling.  No need for acute intervention. Reported  difficulty in ambulation.  Requested for PT evaluation  ESRD on hemodialysis:Dilaysed on  Tuesday Thursday Saturday  Hypertensive emergency : BP has improved.Resume home medications  Diabetes mellitus: Continue home regimen  Coronary artery disease :The patient is on aspirin and Plavix which will be continued  Nausea /vomiting /diarrhea: Resolved   DVT prophylaxis: Heparin [place] Code Status: Full Family Communication: None present at the bedside Disposition Plan: Home tomorrow after physical therapy evaluation   Consultants: Nephrology  Procedures: Dialysis  Antimicrobials: None  Subjective: Patient seen and examined at the dialysis suite.  Denies any chest pain or shortness of breath.  Remains comfortable.  She is bothered due to her swelling of the left ankle.  Hemodynamically stable.  Objective: Vitals:   01/22/18 1045 01/22/18 1115 01/22/18 1145 01/22/18 1303  BP: (!) 102/59 (!) 104/50 (!) 101/40 (!) 188/86  Pulse: 83 85 84 89  Resp:   18 18  Temp:   98.4 F (36.9 C) 99 F (37.2 C)  TempSrc:   Oral Oral  SpO2:   100% 100%  Weight:   96.7 kg   Height:        Intake/Output Summary (Last 24 hours) at 01/22/2018 1619 Last data filed at 01/22/2018 1433 Gross per 24 hour  Intake 270 ml  Output 3165 ml  Net -2895 ml   Filed Weights   01/21/18 1731 01/22/18 0710 01/22/18 1145  Weight: 101.8 kg 99.8 kg 96.7 kg    Examination:  General exam: Appears calm and comfortable ,Not in distress,average built HEENT:PERRL,Oral mucosa  moist, Ear/Nose normal on gross exam Respiratory system: Bilateral decreased air entry in the bases  cardiovascular system: S1 & S2 heard, RRR. No JVD, murmurs, rubs, gallops or clicks. No pedal edema. Gastrointestinal system: Abdomen is nondistended, soft and nontender. No organomegaly or masses felt. Normal bowel sounds heard. Central nervous system: Alert and oriented. No focal neurological deficits. Extremities: No edema, no clubbing  ,no cyanosis, distal peripheral pulses palpable. Left ankle is swollen Skin: No rashes, lesions or ulcers,no icterus ,no pallor MSK: Normal muscle bulk,tone ,power Psychiatry: Judgement and insight appear normal. Mood & affect appropriate.     Data Reviewed: I have personally reviewed following labs and imaging studies  CBC: Recent Labs  Lab 01/21/18 0406 01/22/18 0241  WBC 6.7 4.5  NEUTROABS 4.6  --   HGB 9.7* 9.5*  HCT 32.0* 31.5*  MCV 77.5* 77.4*  PLT 170 967   Basic Metabolic Panel: Recent Labs  Lab 01/21/18 0406 01/21/18 0954 01/21/18 1957 01/22/18 0241  NA 140 141  --  136  K 6.9* 7.2*  --  4.7  CL 103 104  --  99  CO2 19* 22  --  25  GLUCOSE 164* 85  --  243*  BUN 117* 115*  --  50*  CREATININE 12.20* 12.11*  --  6.54*  CALCIUM 7.4* 7.4*  --  7.7*  MG  --   --  2.2  --   PHOS  --  10.3*  --   --    GFR: Estimated Creatinine Clearance: 12.3 mL/min (A) (by C-G formula based on SCr of 6.54 mg/dL (H)). Liver Function Tests: Recent Labs  Lab 01/21/18 0954 01/22/18 0241  AST  --  10*  ALT  --  22  ALKPHOS  --  121  BILITOT  --  0.7  PROT  --  6.8  ALBUMIN 3.3* 3.2*   No results for input(s): LIPASE, AMYLASE in the last 168 hours. No results for input(s): AMMONIA in the last 168 hours. Coagulation Profile: No results for input(s): INR, PROTIME in the last 168 hours. Cardiac Enzymes: Recent Labs  Lab 01/21/18 1957 01/21/18 2252 01/22/18 0241  TROPONINI 0.04* 0.04* 0.04*   BNP (last 3 results) No results for input(s): PROBNP in the last 8760 hours. HbA1C: Recent Labs    01/21/18 1957  HGBA1C 8.0*   CBG: Recent Labs  Lab 01/21/18 1656 01/21/18 2215 01/22/18 0705 01/22/18 1248  GLUCAP 115* 127* 165* 104*   Lipid Profile: No results for input(s): CHOL, HDL, LDLCALC, TRIG, CHOLHDL, LDLDIRECT in the last 72 hours. Thyroid Function Tests: Recent Labs    01/21/18 1957  TSH 1.661   Anemia Panel: No results for input(s): VITAMINB12,  FOLATE, FERRITIN, TIBC, IRON, RETICCTPCT in the last 72 hours. Sepsis Labs: No results for input(s): PROCALCITON, LATICACIDVEN in the last 168 hours.  No results found for this or any previous visit (from the past 240 hour(s)).       Radiology Studies: Dg Ankle 2 Views Left  Result Date: 01/22/2018 CLINICAL DATA:  Atraumatic left ankle pain and swelling EXAM: LEFT ANKLE - 2 VIEW COMPARISON:  10/18/2017 FINDINGS: Chronic fragmentation of the dorsal navicular and anterior talus with regional soft tissue swelling. No progressive progression as expected for osteomyelitis. Charcot joint is not excluded in this patient with diabetes. There is osteopenia and atherosclerosis. IMPRESSION: 1. Similar-appearing dorsal navicular and anterior talus fragmentation when compared to May 2019. Lack of progression argues against infection, but Charcot joint is still considered. 2.  Extensive soft tissue swelling. Electronically Signed   By: [name]   On: 01/22/2018 14:04   Dg Chest Port 1 View  Result Date: 01/21/2018 CLINICAL DATA:  Shortness of breath. EXAM: PORTABLE CHEST 1 VIEW COMPARISON:  01/07/2018 FINDINGS: Unchanged cardiomegaly. Slight worsening of pulmonary edema since prior exam. Patchy bibasilar opacities may be vascular or atelectasis. Suspect small right pleural effusion. No pneumothorax. IMPRESSION: Cardiomegaly with increased pulmonary edema since prior exam and possible right pleural effusion. Electronically Signed   By: Jeb Levering M.D.   On: 01/21/2018 04:23        Scheduled Meds: . amLODipine  5 mg Oral Daily  . aspirin EC  81 mg Oral Q breakfast  . calcium acetate  667 mg Oral TID WC  . carvedilol  6.25 mg Oral BID WC  . Chlorhexidine Gluconate Cloth  6 each Topical Q0600  . Chlorhexidine Gluconate Cloth  6 each Topical Q0600  . clopidogrel  75 mg Oral Daily  . escitalopram  10 mg Oral Daily  . gabapentin  300 mg Oral QHS  . heparin  5,000 Units Subcutaneous Q8H    . insulin aspart  0-15 Units Subcutaneous TID WC  . insulin aspart  2 Units Subcutaneous TID WC  . latanoprost  1 drop Both Eyes QHS  . sevelamer carbonate  2,400 mg Oral TID WC   Continuous Infusions:   LOS: 1 day    Time spent: 25 mins.More than 50% of that time was spent in counseling and/or coordination of care.      Shelly Coss, MD [facility] Pager [phone]  If 7PM-7AM, please contact night-coverage [email or web address] Password [facility] 01/22/2018, 4:19 PM

## 2018-01-22 NOTE — Progress Notes (Addendum)
MD instructed RN to wean pt off 3L 02. Pt sats at 100% RA while resting in bed. PT Beth came to evaluate pt, and after pt walked 5 steps she stated "I'm feeling dizzy." Pt sat down in chair and 02 sats dropped to 88%. 2L 02 was placed on pt. Pt was unresponsive. RN and PT both performed sternal rubs, and pt still not responsive for 15-20 seconds. Pt then became responsive and said she was in the Emergency Room. Pt reoriented and MD paged and made aware. MD stated "I will be up there soon." PT and RN put pt back in bed and on 2L 02. MD at bedside now.  Riley Kill RN

## 2018-01-22 NOTE — Progress Notes (Signed)
I was reported by the nurse that patient has difficulty ambulation.  Will request for physical therapy evaluation before discharge.

## 2018-01-22 NOTE — Progress Notes (Signed)
Initial Nutrition Assessment  DOCUMENTATION CODES:   Morbid obesity  INTERVENTION:    Nepro Shake po BID, each supplement provides 425 kcal and 19 grams protein  NUTRITION DIAGNOSIS:   Increased nutrient needs related to chronic illness as evidenced by estimated needs  GOAL:   Patient will meet greater than or equal to 90% of their needs  MONITOR:   PO intake, Supplement acceptance, Labs, Skin, Weight trends, I & O's  REASON FOR ASSESSMENT:   Malnutrition Screening Tool  ASSESSMENT:   52 yo Female with PMH of ESRD on HD, CHF, on home oxygen 2 L at baseline, HTN and PE who presented to the ED today via EMS, after missing hemodialysis Tuesday.  Pt sleeping upon RD visit. Name called x 2. Per Malnutrition Screening Tool, pt with recent weight loss without trying. Also has been eating poorly because of a decreased appetite.  PO intake has been 50% per flowsheet records. Currently does not have any nutrition supplements ordered. RD to order. PT evaluation is pending prior to D/C.  Medications include Renvela, Phoslo and Maalox. Labs reviewed. BUN 50 (H). Cr 6.54 (H). CBG's [id number].  NUTRITION - FOCUSED PHYSICAL EXAM:  Completed. No muscle or fat depletions noticed.  Diet Order:   Diet Order            Diet - low sodium heart healthy        Diet Carb Modified Fluid consistency: Thin; Room service appropriate? Yes  Diet effective now             EDUCATION NEEDS:   Not appropriate for education at this time  Skin:  Skin Assessment: Reviewed RN Assessment  Last BM:  8/23  Height:   Ht Readings from Last 1 Encounters:  01/21/18 5\' 7"  (1.702 m)   Weight:   Wt Readings from Last 1 Encounters:  01/22/18 96.7 kg   Ideal Body Weight:  61.3 kg  BMI:  Body mass index is 33.39 kg/m.  Estimated Nutritional Needs:   Kcal:  2000-2200  Protein:  105-120 gm  Fluid:  1,000 ml + UOP  Arthur Holms, RD, LDN Pager #: [phone] After-Hours Pager #:  [phone]

## 2018-01-23 DIAGNOSIS — N186 End stage renal disease: Secondary | ICD-10-CM

## 2018-01-23 LAB — GLUCOSE, CAPILLARY
GLUCOSE-CAPILLARY: 132 mg/dL — AB (ref 70–99)
Glucose-Capillary: 109 mg/dL — ABNORMAL HIGH (ref 70–99)
Glucose-Capillary: 129 mg/dL — ABNORMAL HIGH (ref 70–99)
Glucose-Capillary: 156 mg/dL — ABNORMAL HIGH (ref 70–99)

## 2018-01-23 MED ORDER — HEPARIN SODIUM (PORCINE) 1000 UNIT/ML DIALYSIS
4000.0000 [IU] | Freq: Once | INTRAMUSCULAR | Status: DC
  Filled 2018-01-23: qty 4

## 2018-01-23 MED ORDER — TRAZODONE HCL 50 MG PO TABS
50.0000 mg | ORAL_TABLET | Freq: Once | ORAL | Status: AC
  Administered 2018-01-23: 50 mg via ORAL
  Filled 2018-01-23: qty 1

## 2018-01-23 MED ORDER — TRAMADOL HCL 50 MG PO TABS
50.0000 mg | ORAL_TABLET | Freq: Once | ORAL | Status: AC
  Administered 2018-01-23: 50 mg via ORAL
  Filled 2018-01-23: qty 1

## 2018-01-23 MED ORDER — CHLORHEXIDINE GLUCONATE CLOTH 2 % EX PADS: 6.0000 | MEDICATED_PAD | Freq: Every day | CUTANEOUS | Status: DC

## 2018-01-23 NOTE — Plan of Care (Signed)
Discussed plan of care for the evening with patient.  Emphasized pain management and  using the call bell when assistance is needed.  Good teach back displayed.

## 2018-01-23 NOTE — Progress Notes (Signed)
PT Cancellation Note  Patient Details Name: Lauren Warner MRN: [id number] DOB: September 14, 1965   Cancelled Treatment:    Reason Eval/Treat Not Completed: Patient declined, no reason specified. Pt very nauseated at this time. RN notified and planning to give IV Zofran. PT will continue to follow acutely.    [place] 01/23/2018, 10:53 AM

## 2018-01-23 NOTE — Progress Notes (Signed)
PROGRESS NOTE    Lauren Warner  UKG:[id number] DOB: 1966/03/19 DOA: 01/21/2018 PCP: [name], [name]   Brief Narrative: Patient is a 52 year old female with a history of end-stage renal disease , CHF, on home oxygen 2 L at baseline, hypertension, history of PE who presents to the ED today via EMS , after missing hemodialysis Tuesday . She complained of shortness of breath and chest pain. Oxygen saturation was 78% on room air. She was placed on BiPAP and sent to inpatient hemodialysis for emergent dialysis. Patient has a very poor historian but states that she missed 2 sessions of dialysis , and she did not have a ride .patient also complained of left lower extremity pain for the last 2 days. Patient also stated that she's had some nausea vomiting and diarrhea for the last 2 days.  Assessment & Plan:   Principal Problem:   Respiratory failure ([place]) Active Problems:   Acute respiratory failure with hypoxia (HCC)   Essential hypertension   CAD (coronary artery disease)   ESRD (end stage renal disease) ([place])   DM (diabetes mellitus), type 2 with renal complications (HCC)   Hyperkalemia   SOB (shortness of breath)   Acute  respiratory failure : Presented with volume overload because she missed 2 sessions of dialysis.  The transport company did not pick her up.  Her respiratory status has improved this morning after 2 sessions of dialysis. Nephrology cleared her for discharge.  She was still requiring oxygen at 3 L/min today after dialysis so could not be discharged.She is not on oxygen at home.  Will continue to try weaning the oxygen off.  Left lower extremity swelling/ankle pain: She has a prior history of PE currently not on anticoagulation Venous Doppler negative for DVT.  X-ray of the left ankle done which showed unchanged  dorsal navicular and anterior talus fragmentation when compared to May 2019, soft tissue swelling.  No need for acute intervention. Reported  difficulty in ambulation.  Requested for PT evaluation  ESRD on hemodialysis:Dilaysed on  Tuesday Thursday Saturday  Hypertensive emergency : BP has improved.Resume home medications  Diabetes mellitus: Continue home regimen  Coronary artery disease :The patient is on aspirin and Plavix which will be continued  Nausea /vomiting /diarrhea: Resolved   DVT prophylaxis: Heparin [place] Code Status: Full Family Communication: None present at the bedside Disposition Plan: Home tomorrow after physical therapy evaluation   Consultants: Nephrology  Procedures: Dialysis  Antimicrobials: None  Subjective: Patient seen and examined at bedside.  Patient was supposed to have had another 2-hour of hemodialysis today per the neurologist but it was canceled due to her being hypotensive with orthostasis and complaining of dizziness.  She had a blood pressure of 146/71 with pulse of 77 lying down sitting 135/74 pulse 77 standing 107/60 pulse of 60 she was also unable to complete physical therapy due to the low blood pressure.  Her discharge home has been held denies any chest pain or shortness of breath.  Remains comfortable.  She is bothered due to her swelling of the left ankle.     Objective: Vitals:   01/23/18 1400 01/23/18 1403 01/23/18 1406 01/23/18 1705  BP: (!) 146/71 135/74 107/60 121/71  Pulse:    73  Resp:    17  Temp:    98.5 F (36.9 C)  TempSrc:    Oral  SpO2:    98%  Weight:      Height:        Intake/Output Summary (Last  24 hours) at 01/23/2018 1944 Last data filed at 01/22/2018 2300 Gross per 24 hour  Intake 50 ml  Output -  Net 50 ml   Filed Weights   01/22/18 1145 01/23/18 0335 01/23/18 0933  Weight: 96.7 kg 100 kg 95.2 kg    Examination:  General exam: Appears calm and comfortable ,Not in distress,average built HEENT:PERRL,Oral mucosa moist, Ear/Nose normal on gross exam Respiratory system: Bilateral decreased air entry in the bases  cardiovascular system:  S1 & S2 heard, RRR. No JVD, murmurs, rubs, gallops or clicks. No pedal edema. Gastrointestinal system: Abdomen is nondistended, soft and nontender. No organomegaly or masses felt. Normal bowel sounds heard. Central nervous system: Alert and oriented. No focal neurological deficits. Extremities: No edema, no clubbing ,no cyanosis, distal peripheral pulses palpable. Left ankle is swollen Skin: No rashes, lesions or ulcers,no icterus ,no pallor MSK: Normal muscle bulk,tone ,power Psychiatry: Judgement and insight appear normal. Mood & affect appropriate.     Data Reviewed: I have personally reviewed following labs and imaging studies  CBC: Recent Labs  Lab 01/21/18 0406 01/22/18 0241  WBC 6.7 4.5  NEUTROABS 4.6  --   HGB 9.7* 9.5*  HCT 32.0* 31.5*  MCV 77.5* 77.4*  PLT 170 259   Basic Metabolic Panel: Recent Labs  Lab 01/21/18 0406 01/21/18 0954 01/21/18 1957 01/22/18 0241  NA 140 141  --  136  K 6.9* 7.2*  --  4.7  CL 103 104  --  99  CO2 19* 22  --  25  GLUCOSE 164* 85  --  243*  BUN 117* 115*  --  50*  CREATININE 12.20* 12.11*  --  6.54*  CALCIUM 7.4* 7.4*  --  7.7*  MG  --   --  2.2  --   PHOS  --  10.3*  --   --    GFR: Estimated Creatinine Clearance: 12.2 mL/min (A) (by C-G formula based on SCr of 6.54 mg/dL (H)). Liver Function Tests: Recent Labs  Lab 01/21/18 0954 01/22/18 0241  AST  --  10*  ALT  --  22  ALKPHOS  --  121  BILITOT  --  0.7  PROT  --  6.8  ALBUMIN 3.3* 3.2*   No results for input(s): LIPASE, AMYLASE in the last 168 hours. No results for input(s): AMMONIA in the last 168 hours. Coagulation Profile: No results for input(s): INR, PROTIME in the last 168 hours. Cardiac Enzymes: Recent Labs  Lab 01/21/18 1957 01/21/18 2252 01/22/18 0241  TROPONINI 0.04* 0.04* 0.04*   BNP (last 3 results) No results for input(s): PROBNP in the last 8760 hours. HbA1C: Recent Labs    01/21/18 1957  HGBA1C 8.0*   CBG: Recent Labs  Lab  01/22/18 1657 01/22/18 2148 01/23/18 0752 01/23/18 1154 01/23/18 1707  GLUCAP 133* 75 109* 129* 156*   Lipid Profile: No results for input(s): CHOL, HDL, LDLCALC, TRIG, CHOLHDL, LDLDIRECT in the last 72 hours. Thyroid Function Tests: Recent Labs    01/21/18 1957  TSH 1.661   Anemia Panel: No results for input(s): VITAMINB12, FOLATE, FERRITIN, TIBC, IRON, RETICCTPCT in the last 72 hours. Sepsis Labs: No results for input(s): PROCALCITON, LATICACIDVEN in the last 168 hours.  No results found for this or any previous visit (from the past 240 hour(s)).       Radiology Studies: Dg Ankle 2 Views Left  Result Date: 01/22/2018 CLINICAL DATA:  Atraumatic left ankle pain and swelling EXAM: LEFT ANKLE - 2 VIEW COMPARISON:  10/18/2017 FINDINGS: Chronic fragmentation of the dorsal navicular and anterior talus with regional soft tissue swelling. No progressive progression as expected for osteomyelitis. Charcot joint is not excluded in this patient with diabetes. There is osteopenia and atherosclerosis. IMPRESSION: 1. Similar-appearing dorsal navicular and anterior talus fragmentation when compared to May 2019. Lack of progression argues against infection, but Charcot joint is still considered. 2. Extensive soft tissue swelling. Electronically Signed   By: [name]   On: 01/22/2018 14:04        Scheduled Meds: . amLODipine  5 mg Oral Daily  . aspirin EC  81 mg Oral Q breakfast  . calcium acetate  667 mg Oral TID WC  . carvedilol  6.25 mg Oral BID WC  . Chlorhexidine Gluconate Cloth  6 each Topical Q0600  . clopidogrel  75 mg Oral Daily  . escitalopram  10 mg Oral Daily  . feeding supplement (NEPRO CARB STEADY)  237 mL Oral BID BM  . gabapentin  300 mg Oral QHS  . heparin  4,000 Units Dialysis Once in dialysis  . heparin  5,000 Units Subcutaneous Q8H  . insulin aspart  0-15 Units Subcutaneous TID WC  . insulin aspart  2 Units Subcutaneous TID WC  . latanoprost  1 drop  Both Eyes QHS  . sevelamer carbonate  2,400 mg Oral TID WC   Continuous Infusions:   LOS: 2 days    Time spent: 25 mins.More than 50% of that time was spent in counseling and/or coordination of care.      Cristal Deer, MD [facility] Pager [phone]  If 7PM-7AM, please contact night-coverage [email or web address] Password [facility] 01/23/2018, 7:44 PM

## 2018-01-23 NOTE — Progress Notes (Addendum)
Physical Therapy Treatment Patient Details Name: Lauren Warner MRN: [id number] DOB: [birth year] Today's Date: 01/23/2018    History of Present Illness Pt is a 52 y/o female with a history of end-stage renal disease , CHF, on home oxygen 2 L at baseline, hypertension, history of PE who presents to the ED today via EMS , after missing hemodialysis Tuesday  . She complained of shortness of breath and chest pain. Oxygen saturation was 78% on room air. She was placed on BiPAP and sent to inpatient hemodialysis for emergent dialysis.     PT Comments    Pt very limited this session secondary to fatigue, weakness, L ankle pain and feeling dizzy with activity. Pt only able to tolerated bed mobility and sit<>stand transfers from EOB with RW and min A for stability. Pt only able to tolerated standing for ~60 seconds before needing to sit back down due to dizziness. Pt's BP assessed (supine=122/86, sitting=125/86, standing=111/83). PT updated recommendations as pt lives alone and has no one to assist her as she very recently moved from [place] (~3 months ago). Pt would continue to benefit from skilled physical therapy services at this time while admitted and after d/c to address the below listed limitations in order to improve overall safety and independence with functional mobility.   Follow Up Recommendations  SNF;Supervision/Assistance - 24 hour;Other (comment)(pt lives alone and has no one to assist her upon d/c)     Equipment Recommendations  Rolling walker with 5" wheels    Recommendations for Other Services       Precautions / Restrictions Precautions Precautions: Fall Precaution Comments: sycopal event, watch vitals  Restrictions Weight Bearing Restrictions: No    Mobility  Bed Mobility Overal bed mobility: Needs Assistance Bed Mobility: Supine to Sit;Sit to Supine     Supine to sit: Supervision;HOB elevated Sit to supine: Min guard   General bed mobility comments: increased  time and effort  Transfers Overall transfer level: Needs assistance Equipment used: Rolling walker (2 wheeled) Transfers: Sit to/from Stand Sit to Stand: Min assist         General transfer comment: increased time, good technique, min A for stability with transition and with pt's reported dizziness  Ambulation/Gait             General Gait Details: pt unable to ambulate during this session secondary to L ankle pain and dizziness   Stairs             Wheelchair Mobility    Modified Rankin (Stroke Patients Only)       Balance Overall balance assessment: Needs assistance Sitting-balance support: Feet supported;No upper extremity supported Sitting balance-Leahy Scale: Good     Standing balance support: During functional activity;Single extremity supported Standing balance-Leahy Scale: Poor                              Cognition Arousal/Alertness: Awake/alert Behavior During Therapy: WFL for tasks assessed/performed;Flat affect Overall Cognitive Status: Impaired/Different from baseline Area of Impairment: Problem solving                             Problem Solving: Slow processing        Exercises      General Comments        Pertinent Vitals/Pain Pain Assessment: Faces Faces Pain Scale: Hurts a little bit Pain Location: L ankle  Pain Descriptors / Indicators: Sore;Throbbing;Aching Pain  Intervention(s): Monitored during session;Repositioned    Home Living                      Prior Function            PT Goals (current goals can now be found in the care plan section) Acute Rehab PT Goals PT Goal Formulation: With patient Time For Goal Achievement: 02/05/18 Potential to Achieve Goals: Fair Progress towards PT goals: Progressing toward goals    Frequency    Min 3X/week      PT Plan Discharge plan needs to be updated    Co-evaluation              AM-PAC PT "6 Clicks" Daily Activity   Outcome Measure  Difficulty turning over in bed (including adjusting bedclothes, sheets and blankets)?: None Difficulty moving from lying on back to sitting on the side of the bed? : None Difficulty sitting down on and standing up from a chair with arms (e.g., wheelchair, bedside commode, etc,.)?: Unable Help needed moving to and from a bed to chair (including a wheelchair)?: A Little Help needed walking in hospital room?: A Little Help needed climbing 3-5 steps with a railing? : Total 6 Click Score: 16    End of Session   Activity Tolerance: Patient limited by fatigue;Patient limited by pain Patient left: in bed;with call bell/phone within reach;with bed alarm set Nurse Communication: Mobility status PT Visit Diagnosis: Unsteadiness on feet (R26.81);Other abnormalities of gait and mobility (R26.89);Muscle weakness (generalized) (M62.81);Pain Pain - Right/Left: Left Pain - part of body: Ankle and joints of foot     Time: 1527-1549 PT Time Calculation (min) (ACUTE ONLY): 22 min  Charges:  $Therapeutic Activity: 8-22 mins                     [place], [place], [place] 01/23/2018, 4:15 PM

## 2018-01-23 NOTE — Progress Notes (Signed)
[facility] Progress Note  Subjective: no new c/o, Lauren Warner stood to weigh at 95.2 kg.  Wants to go home.    Vitals:   01/23/18 0335 01/23/18 0751 01/23/18 0933 01/23/18 0940  BP:  (!) 158/81    Pulse:  74    Resp:  18    Temp:  98.2 F (36.8 C)    TempSrc:  Oral    SpO2:  100%  98%  Weight: 100 kg  95.2 kg   Height:        Inpatient medications: . amLODipine  5 mg Oral Daily  . aspirin EC  81 mg Oral Q breakfast  . calcium acetate  667 mg Oral TID WC  . carvedilol  6.25 mg Oral BID WC  . Chlorhexidine Gluconate Cloth  6 each Topical Q0600  . Chlorhexidine Gluconate Cloth  6 each Topical Q0600  . clopidogrel  75 mg Oral Daily  . escitalopram  10 mg Oral Daily  . feeding supplement (NEPRO CARB STEADY)  237 mL Oral BID BM  . gabapentin  300 mg Oral QHS  . heparin  5,000 Units Subcutaneous Q8H  . insulin aspart  0-15 Units Subcutaneous TID WC  . insulin aspart  2 Units Subcutaneous TID WC  . latanoprost  1 drop Both Eyes QHS  . sevelamer carbonate  2,400 mg Oral TID WC    acetaminophen **OR** acetaminophen, albuterol, alum & mag hydroxide-simeth, hydrALAZINE, ondansetron **OR** ondansetron (ZOFRAN) IV  Iron/TIBC/Ferritin/ %Sat No results found for: IRON, TIBC, FERRITIN, IRONPCTSAT  Exam: Gen on HD, no distress, calm Chest clear bilat RRR no MRG Abd soft ntnd no mass or ascites +bs obese GU defer MS no joint effusions or deformity Ext 1+ L pedal edema, no wounds or ulcers Neuro is alert, Ox 3 , nf    Home meds:  - amlodipine 10 qd/ carvedilol 6.25 bid  - ecasa 81 qd/ clopidogrel 75 qd  - escitalopram 10 qd/ gabapentin 300 hs/ prn zofran  - insulin lispro 2u tid ac if bs > 110  - alb HFA prn  - calcium acetate 1 tid ac/ sevelamer carb 2400 tid ac  Dialysis: TTS SW  4h   94.5kg  2/2.25 bath  LUA AVF  Hep 4000  - hect 1 ug  - completed venofer 100mg  x 10 on 12/10/17  - last mircera 100 on 7/11, last Hb 8.7 on 7/30     Impression: 1. Resp  distress/ pulm edema / vol overload - resolved, is close to dry wt today.  2. Noncompliance - missed HD x 3 in 2 wks. Lauren Warner said the transport company hasn't been picking her up but we called them and they have been going to her apt but she is not been answering the door and not answering her phone when they call.  Patient said she waits in the bedroom in the back of the apartment and also that she sometimes falls asleep while waiting because of her insomnia. Explained she needs to sit and wait for her ride very close to the front door and get transport company her new phone number. OK for dc after short HD today to get back on schedule.  3. ESRD on HD TTS - started in [place] early 2018.  4. HTN urgency - BP's better, getting home meds, getting vol down  5. Depression - recent admit for SI, resolved and seen by psych 6. DM on insulin 7. MBD ckd - cont binders, vdra 8. Anemia ckd -  Hb 9.7, no worse than at OP center  Plan - as above   Kelly Splinter MD [place] pager [phone]   01/23/2018, 10:37 AM   Recent Labs  Lab 01/21/18 0954 01/22/18 0241  NA 141 136  K 7.2* 4.7  CL 104 99  CO2 22 25  GLUCOSE 85 243*  BUN 115* 50*  CREATININE 12.11* 6.54*  CALCIUM 7.4* 7.7*  PHOS 10.3*  --   ALBUMIN 3.3* 3.2*   Recent Labs  Lab 01/22/18 0241  AST 10*  ALT 22  ALKPHOS 121  BILITOT 0.7  PROT 6.8   Recent Labs  Lab 01/21/18 0406 01/22/18 0241  WBC 6.7 4.5  NEUTROABS 4.6  --   HGB 9.7* 9.5*  HCT 32.0* 31.5*  MCV 77.5* 77.4*  PLT 170 182

## 2018-01-24 DIAGNOSIS — R0602 Shortness of breath: Secondary | ICD-10-CM

## 2018-01-24 DIAGNOSIS — I10 Essential (primary) hypertension: Secondary | ICD-10-CM

## 2018-01-24 LAB — GLUCOSE, CAPILLARY
GLUCOSE-CAPILLARY: 116 mg/dL — AB (ref 70–99)
Glucose-Capillary: 107 mg/dL — ABNORMAL HIGH (ref 70–99)
Glucose-Capillary: 138 mg/dL — ABNORMAL HIGH (ref 70–99)
Glucose-Capillary: 211 mg/dL — ABNORMAL HIGH (ref 70–99)

## 2018-01-24 MED ORDER — CHLORHEXIDINE GLUCONATE CLOTH 2 % EX PADS: 6.0000 | MEDICATED_PAD | Freq: Every day | CUTANEOUS | 0 refills | Status: DC

## 2018-01-24 MED ORDER — NEPRO/CARBSTEADY PO LIQD: 237.0000 mL | Freq: Two times a day (BID) | ORAL | 0 refills | Status: DC

## 2018-01-24 MED ORDER — HEPARIN SODIUM (PORCINE) 1000 UNIT/ML DIALYSIS: 4000.0000 [IU] | Freq: Once | INTRAMUSCULAR | 0 refills | Status: DC

## 2018-01-24 NOTE — Progress Notes (Signed)
PROGRESS NOTE    Lauren Warner  EFE:[id number] DOB: 06/13/1965 DOA: 01/21/2018 PCP: [name], [name]   Brief Narrative: Patient is a 52 year old female with a history of end-stage renal disease , CHF, on home oxygen 2 L at baseline, hypertension, history of PE who presents to the ED today via EMS , after missing hemodialysis Tuesday . She complained of shortness of breath and chest pain. Oxygen saturation was 78% on room air. She was placed on BiPAP and sent to inpatient hemodialysis for emergent dialysis. Patient has a very poor historian but states that she missed 2 sessions of dialysis , and she did not have a ride .patient also complained of left lower extremity pain for the last 2 days. Patient also stated that she's had some nausea vomiting and diarrhea for the last 2 days.  She has been having orthostatic hypotension with oxygen desaturation upon ambulation.  As a result her discharge home was held as she lives alone and her safety is of concern  Assessment & Plan:   Principal Problem:   Respiratory failure ([place]) Active Problems:   Acute respiratory failure with hypoxia (HCC)   Essential hypertension   CAD (coronary artery disease)   ESRD (end stage renal disease) ([place])   DM (diabetes mellitus), type 2 with renal complications (H[facility])   Hyperkalemia   SOB (shortness of breath)   Acute  respiratory failure : Presented with volume overload because she missed 2 sessions of dialysis.  The transport company did not pick her up.  Her respiratory status has improved this morning after 2 sessions of dialysis. Nephrology cleared her for discharge.  She was still requiring oxygen with ambulation her oxygen desaturated to 88%.  So could not be discharged.She is not on oxygen at home.  She will need to be discharged on home oxygenLeft lower extremity swelling/ankle pain: She has a prior history of PE currently not on anticoagulation Venous Doppler negative for DVT.  X-ray of the  left ankle done which showed unchanged  dorsal navicular and anterior talus fragmentation when compared to May 2019, soft tissue swelling.  No need for acute intervention. Reported difficulty in ambulation.   PT evaluation done   ESRD on hemodialysis:Dilaysed on  Tuesday Thursday Saturday  Hypertensive emergency  : BP has improved.Resume home medications   Orthostatic hypotension as a result of this she could not complete her hemodialysis and physical therapy had to be postponed a number of times.  She is also having episodes of oxygen desaturation when she gets up and walks for only a short distance.  Is going to need home health we have discussed possibility of nursing home placement but she is not in agreement with that yet she will need to be discharged home on home health with physical therapy and a walker when she is stable to be discharged  Diabetes mellitus: Continue home regimen  Coronary artery disease :The patient is on aspirin and Plavix which will be continued  Nausea /vomiting /diarrhea: Resolved   DVT prophylaxis: Heparin [place] Code Status: Full Family Communication: None present at the bedside Disposition Plan: Home tomorrow after physical therapy evaluation   Consultants: Nephrology  Procedures: Dialysis  Antimicrobials: None  Subjective: Patient seen and examined at bedside.  Patient was supposed to have had another 2-hour of hemodialysis today per the neurologist but it was canceled due to her being hypotensive with orthostasis and complaining of dizziness.  She had a blood pressure of 146/71 with pulse of 77 lying down  sitting 135/74 pulse 77 standing 107/60 pulse of 60 she was also unable to complete physical therapy due to the low blood pressure.  Her discharge home has been held denies any chest pain or shortness of breath.  Remains comfortable.  She is bothered due to her swelling of the left ankle.     Objective: Vitals:   01/23/18 1406 01/23/18 1705  01/24/18 0028 01/24/18 0843  BP: 107/60 121/71 (!) 163/83 (!) 164/91  Pulse:  73 74 76  Resp:  17 20   Temp:  98.5 F (36.9 C) 98.5 F (36.9 C) 98 F (36.7 C)  TempSrc:  Oral Oral Oral  SpO2:  98% 96% 97%  Weight:      Height:        Intake/Output Summary (Last 24 hours) at 01/24/2018 1600 Last data filed at 01/24/2018 1200 Gross per 24 hour  Intake 240 ml  Output -  Net 240 ml   Filed Weights   01/22/18 1145 01/23/18 0335 01/23/18 0933  Weight: 96.7 kg 100 kg 95.2 kg    Examination:  General exam: Appears calm and comfortable ,Not in distress,average built HEENT:PERRL,Oral mucosa moist, Ear/Nose normal on gross exam Respiratory system: Bilateral decreased air entry in the bases  cardiovascular system: S1 & S2 heard, RRR. No JVD, murmurs, rubs, gallops or clicks. No pedal edema. Gastrointestinal system: Abdomen is nondistended, soft and nontender. No organomegaly or masses felt. Normal bowel sounds heard. Central nervous system: Alert and oriented. No focal neurological deficits. Extremities: No edema, no clubbing ,no cyanosis, distal peripheral pulses palpable. Left ankle is swollen Skin: No rashes, lesions or ulcers,no icterus ,no pallor MSK: Normal muscle bulk,tone ,power Psychiatry: Judgement and insight appear normal. Mood & affect appropriate.     Data Reviewed: I have personally reviewed following labs and imaging studies  CBC: Recent Labs  Lab 01/21/18 0406 01/22/18 0241  WBC 6.7 4.5  NEUTROABS 4.6  --   HGB 9.7* 9.5*  HCT 32.0* 31.5*  MCV 77.5* 77.4*  PLT 170 259   Basic Metabolic Panel: Recent Labs  Lab 01/21/18 0406 01/21/18 0954 01/21/18 1957 01/22/18 0241  NA 140 141  --  136  K 6.9* 7.2*  --  4.7  CL 103 104  --  99  CO2 19* 22  --  25  GLUCOSE 164* 85  --  243*  BUN 117* 115*  --  50*  CREATININE 12.20* 12.11*  --  6.54*  CALCIUM 7.4* 7.4*  --  7.7*  MG  --   --  2.2  --   PHOS  --  10.3*  --   --    GFR: Estimated Creatinine  Clearance: 12.2 mL/min (A) (by C-G formula based on SCr of 6.54 mg/dL (H)). Liver Function Tests: Recent Labs  Lab 01/21/18 0954 01/22/18 0241  AST  --  10*  ALT  --  22  ALKPHOS  --  121  BILITOT  --  0.7  PROT  --  6.8  ALBUMIN 3.3* 3.2*   No results for input(s): LIPASE, AMYLASE in the last 168 hours. No results for input(s): AMMONIA in the last 168 hours. Coagulation Profile: No results for input(s): INR, PROTIME in the last 168 hours. Cardiac Enzymes: Recent Labs  Lab 01/21/18 1957 01/21/18 2252 01/22/18 0241  TROPONINI 0.04* 0.04* 0.04*   BNP (last 3 results) No results for input(s): PROBNP in the last 8760 hours. HbA1C: Recent Labs    01/21/18 1957  HGBA1C 8.0*  CBG: Recent Labs  Lab 01/23/18 1154 01/23/18 1707 01/23/18 2058 01/24/18 0746 01/24/18 1153  GLUCAP 129* 156* 132* 116* 107*   Lipid Profile: No results for input(s): CHOL, HDL, LDLCALC, TRIG, CHOLHDL, LDLDIRECT in the last 72 hours. Thyroid Function Tests: Recent Labs    01/21/18 1957  TSH 1.661   Anemia Panel: No results for input(s): VITAMINB12, FOLATE, FERRITIN, TIBC, IRON, RETICCTPCT in the last 72 hours. Sepsis Labs: No results for input(s): PROCALCITON, LATICACIDVEN in the last 168 hours.  No results found for this or any previous visit (from the past 240 hour(s)).       Radiology Studies: No results found.      Scheduled Meds: . amLODipine  5 mg Oral Daily  . aspirin EC  81 mg Oral Q breakfast  . calcium acetate  667 mg Oral TID WC  . carvedilol  6.25 mg Oral BID WC  . Chlorhexidine Gluconate Cloth  6 each Topical Q0600  . clopidogrel  75 mg Oral Daily  . escitalopram  10 mg Oral Daily  . feeding supplement (NEPRO CARB STEADY)  237 mL Oral BID BM  . gabapentin  300 mg Oral QHS  . heparin  4,000 Units Dialysis Once in dialysis  . heparin  5,000 Units Subcutaneous Q8H  . insulin aspart  0-15 Units Subcutaneous TID WC  . insulin aspart  2 Units Subcutaneous TID WC    . latanoprost  1 drop Both Eyes QHS  . sevelamer carbonate  2,400 mg Oral TID WC   Continuous Infusions:   LOS: 3 days    Time spent: 25 mins.More than 50% of that time was spent in counseling and/or coordination of care.      Cristal Deer, MD [facility]   If 7PM-7AM, please contact night-coverage [email or web address] Password [email or web address] 01/24/2018, 4:00 PM
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
## 2018-01-24 NOTE — Progress Notes (Signed)
Pt reported she had not voided all day. Pt attempted to void but was unable to. RN bladder scanned pt where 305 ml total was found. RN reported findings to MD. Pt in and out catheterized, and 500 ml of urine removed.

## 2018-01-24 NOTE — NC FL2 (Addendum)
[place] LEVEL OF CARE SCREENING TOOL     IDENTIFICATION  Patient Name: Lauren Warner Birthdate: Oct 10, 1965 Sex: female Admission Date (Current Location): 01/21/2018  [facility] and [place] Number:  Herbalist and Address:  [facility] [place]. [facility], [place] [address] [place], [place] [address]      Provider Number: [id number]  Attending Physician Name and Address:  Cristal Deer, MD  Relative Name and Phone Number:       Current Level of Care: Hospital Recommended Level of Care: [place] Prior Approval Number:    Date Approved/Denied:   PASRR Number: [id number]  Discharge Plan: SNF    Current Diagnoses: Patient Active Problem List   Diagnosis Date Noted  . SOB (shortness of breath) 01/22/2018  . Respiratory failure ([place]) 01/21/2018  . MDD (major depressive disorder), single episode, moderate ([place])   . Acute encephalopathy 01/07/2018  . Suicidal ideation 01/07/2018  . Hypertensive encephalopathy 12/23/2017  . Hypertensive urgency 12/22/2017  . Syncope and collapse 12/03/2017  . Hyperkalemia 11/10/2017  . Pressure injury of skin 10/20/2017  . Essential hypertension 10/19/2017  . CAD (coronary artery disease) 10/19/2017  . ESRD (end stage renal disease) ([place]) 10/19/2017  . Syncope 10/19/2017  . Anemia due to end stage renal disease ([place]) 10/19/2017  . Closed left ankle fracture 10/19/2017  . Nausea vomiting and diarrhea 10/19/2017  . DM (diabetes mellitus), type 2 with renal complications ([place]) 23/53/6144  . Acute respiratory failure with hypoxia ([place]) 10/18/2017    Orientation RESPIRATION BLADDER Height & Weight     Self, Time, Situation, Place  Normal Continent Weight: 209 lb 14.1 oz (95.2 kg) Height:  5\' 7"  (170.2 cm)  BEHAVIORAL SYMPTOMS/MOOD NEUROLOGICAL BOWEL NUTRITION STATUS      Continent Diet(carb modified)  AMBULATORY STATUS COMMUNICATION OF NEEDS Skin   Extensive Assist Verbally Normal                      Personal Care Assistance Level of Assistance  Bathing, Dressing Bathing Assistance: Limited assistance   Dressing Assistance: Limited assistance     Functional Limitations Info             SPECIAL CARE FACTORS FREQUENCY  PT (By licensed PT), OT (By licensed OT)     PT Frequency: 5/wk OT Frequency: 5/wk            Contractures      Additional Factors Info  Code Status, Allergies, Insulin Sliding Scale Code Status Info: FULL Allergies Info: Adhesive Tape, Hydrocodone-acetaminophen, Metformin And Related   Insulin Sliding Scale Info: see DC summary       Current Medications (01/24/2018):  This is the current hospital active medication list Current Facility-Administered Medications  Medication Dose Route Frequency Provider Last Rate Last Dose  . acetaminophen (TYLENOL) tablet 650 mg  650 mg Oral Q6H PRN Reyne Dumas, MD   650 mg at 01/23/18 2341   Or  . acetaminophen (TYLENOL) suppository 650 mg  650 mg Rectal Q6H PRN Abrol, Ascencion Dike, MD      . albuterol (PROVENTIL) (2.5 MG/3ML) 0.083% nebulizer solution 2.5 mg  2.5 mg Nebulization Q6H PRN Reyne Dumas, MD      . alum & mag hydroxide-simeth (MAALOX/MYLANTA) 200-200-20 MG/5ML suspension 30 mL  30 mL Oral Q4H PRN Reyne Dumas, MD   30 mL at 01/22/18 0000  . amLODipine (NORVASC) tablet 5 mg  5 mg Oral Daily Reyne Dumas, MD   5 mg at 01/24/18  3825  . aspirin EC tablet 81 mg  81 mg Oral Q breakfast Reyne Dumas, MD   81 mg at 01/24/18 0539  . calcium acetate (PHOSLO) capsule 667 mg  667 mg Oral TID WC Reyne Dumas, MD   667 mg at 01/24/18 7673  . carvedilol (COREG) tablet 6.25 mg  6.25 mg Oral BID WC Roney Jaffe, MD   6.25 mg at 01/24/18 0947  . Chlorhexidine Gluconate Cloth 2 % PADS 6 each  6 each Topical Q0600 Roney Jaffe, MD      . clopidogrel (PLAVIX) tablet 75 mg  75 mg Oral Daily Reyne Dumas, MD   75 mg at 01/24/18 0947  . escitalopram (LEXAPRO) tablet 10 mg  10 mg Oral Daily Reyne Dumas, MD   10 mg at 01/24/18 0947  . feeding supplement (NEPRO CARB STEADY) liquid 237 mL  237 mL Oral BID BM Adhikari, Amrit, MD      . gabapentin (NEURONTIN) capsule 300 mg  300 mg Oral QHS Reyne Dumas, MD   300 mg at 01/23/18 2301  . heparin injection 4,000 Units  4,000 Units Dialysis Once in dialysis Roney Jaffe, MD      . heparin injection 5,000 Units  5,000 Units Subcutaneous Q8H Reyne Dumas, MD   5,000 Units at 01/24/18 [phone]  . hydrALAZINE (APRESOLINE) injection 10-20 mg  10-20 mg Intravenous Q4H PRN Roney Jaffe, MD   20 mg at 01/21/18 1354  . insulin aspart (novoLOG) injection 0-15 Units  0-15 Units Subcutaneous TID WC Reyne Dumas, MD   3 Units at 01/23/18 1751  . insulin aspart (novoLOG) injection 2 Units  2 Units Subcutaneous TID WC Reyne Dumas, MD   2 Units at 01/24/18 0948  . latanoprost (XALATAN) 0.005 % ophthalmic solution 1 drop  1 drop Both Eyes QHS Reyne Dumas, MD   1 drop at 01/23/18 2302  . ondansetron (ZOFRAN) tablet 4 mg  4 mg Oral Q6H PRN Reyne Dumas, MD   4 mg at 01/22/18 1641   Or  . ondansetron (ZOFRAN) injection 4 mg  4 mg Intravenous Q6H PRN Reyne Dumas, MD   4 mg at 01/23/18 1054  . sevelamer carbonate (RENVELA) tablet 2,400 mg  2,400 mg Oral TID WC Reyne Dumas, MD   2,400 mg at 01/24/18 7902     Discharge Medications: Please see discharge summary for a list of discharge medications.  Relevant Imaging Results:  Relevant Lab Results:   Additional Information SS#: [id number]; dialysis TTS at [facility] center; chair time 12pm uses [address] transport  [place], [place], [place]
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
## 2018-01-24 NOTE — Progress Notes (Signed)
[facility] Progress Note  Subjective: has some orthostatic BP drops 130's > low 100's.  Pt up in chair, no SOB or other c/o's.      Vitals:   01/23/18 1406 01/23/18 1705 01/24/18 0028 01/24/18 0843  BP: 107/60 121/71 (!) 163/83 (!) 164/91  Pulse:  73 74 76  Resp:  17 20   Temp:  98.5 F (36.9 C) 98.5 F (36.9 C) 98 F (36.7 C)  TempSrc:  Oral Oral Oral  SpO2:  98% 96% 97%  Weight:      Height:        Inpatient medications: . amLODipine  5 mg Oral Daily  . aspirin EC  81 mg Oral Q breakfast  . calcium acetate  667 mg Oral TID WC  . carvedilol  6.25 mg Oral BID WC  . Chlorhexidine Gluconate Cloth  6 each Topical Q0600  . clopidogrel  75 mg Oral Daily  . escitalopram  10 mg Oral Daily  . feeding supplement (NEPRO CARB STEADY)  237 mL Oral BID BM  . gabapentin  300 mg Oral QHS  . heparin  4,000 Units Dialysis Once in dialysis  . heparin  5,000 Units Subcutaneous Q8H  . insulin aspart  0-15 Units Subcutaneous TID WC  . insulin aspart  2 Units Subcutaneous TID WC  . latanoprost  1 drop Both Eyes QHS  . sevelamer carbonate  2,400 mg Oral TID WC    acetaminophen **OR** acetaminophen, albuterol, alum & mag hydroxide-simeth, hydrALAZINE, ondansetron **OR** ondansetron (ZOFRAN) IV  Iron/TIBC/Ferritin/ %Sat No results found for: IRON, TIBC, FERRITIN, IRONPCTSAT  Exam: Gen on HD, no distress, calm Chest clear bilat RRR no MRG Abd soft ntnd no mass or ascites +bs obese GU defer MS no joint effusions or deformity Ext 1+ L pedal edema, no wounds or ulcers Neuro is alert, Ox 3 , nf    Home meds:  - amlodipine 10 qd/ carvedilol 6.25 bid  - ecasa 81 qd/ clopidogrel 75 qd  - escitalopram 10 qd/ gabapentin 300 hs/ prn zofran  - insulin lispro 2u tid ac if bs > 110  - alb HFA prn  - calcium acetate 1 tid ac/ sevelamer carb 2400 tid ac  Dialysis: TTS SW  4h   94.5kg  2/2.25 bath  LUA AVF  Hep 4000  - hect 1 ug  - completed venofer 100mg  x 10 on 12/10/17  -  last mircera 100 on 7/11, last Hb 8.7 on 7/30     Impression: 1. Orthostatic BP drop - this is likely due to autonomic neuropathy. Suspect this is her baseline w/ long-standing type 1 DM.  Would not recommend therapy (midodrine) given lowest standing BP's are in the 100's.  Not vol depleted. OK for dc from renal standpoint.  2. Resp distress/ pulm edema / vol overload - resolved. Will keep the same edw for now.  3. Noncompliance - missed HD x 3 in 2 wks, she knows to call her transport company and see what can be done to prevent missed rides in the future.  4. ESRD on HD TTS - started in [place] early 2018. Next HD on Tuesday as OP.  5. HTN urgency - resolved 6. Depression - recent admit for SI, resolved and seen by psych 7. DM on insulin 8. MBD ckd - cont binders, vdra 9. Anemia ckd - Hb 9.7, no worse than at OP center  Plan - as above   [place] pager [phone]  01/24/2018, 11:54 AM   Recent Labs  Lab 01/21/18 0954 01/22/18 0241  NA 141 136  K 7.2* 4.7  CL 104 99  CO2 22 25  GLUCOSE 85 243*  BUN 115* 50*  CREATININE 12.11* 6.54*  CALCIUM 7.4* 7.7*  PHOS 10.3*  --   ALBUMIN 3.3* 3.2*   Recent Labs  Lab 01/22/18 0241  AST 10*  ALT 22  ALKPHOS 121  BILITOT 0.7  PROT 6.8   Recent Labs  Lab 01/21/18 0406 01/22/18 0241  WBC 6.7 4.5  NEUTROABS 4.6  --   HGB 9.7* 9.5*  HCT 32.0* 31.5*  MCV 77.5* 77.4*  PLT 170 182

## 2018-01-24 NOTE — Progress Notes (Signed)
SATURATION QUALIFICATIONS: (This note is used to comply with regulatory documentation for home oxygen)  Patient Saturations on Room Air at Rest = 100%  Patient Saturations on Room Air while Ambulating = 96%  Patient Saturations on 2 Liters of oxygen while Ambulating = 100%  Please briefly explain why patient needs home oxygen: pt states she was on home O2 prior to recent moved to [place] 3 months ago. Pulse ox test shows pt is not a candidate for O2. She does have issues with orthostatic hypotension and becomes light headed with labored breathing when going from sitting to standing.

## 2018-01-24 NOTE — Progress Notes (Signed)
Physical Therapy Treatment Patient Details Name: Lauren Warner MRN: [id number] DOB: 07-Oct-1965 Today's Date: 01/24/2018    History of Present Illness Pt is a 52 y/o female with a history of end-stage renal disease , CHF, on home oxygen 2 L at baseline, hypertension, history of PE who presents to the ED today via EMS , after missing hemodialysis Tuesday  . She complained of shortness of breath and chest pain. Oxygen saturation was 78% on room air. She was placed on BiPAP and sent to inpatient hemodialysis for emergent dialysis.     PT Comments    Continuing work on functional mobility and activity tolerance;  Session focused on getting Orthostatic BPs ( see below in general comments) and continuing assessment of activity tolerance; Walked in room today, sleepy, but participating well; She did have an SBP drop from 130s to 100s between sitting and standing; Noting improvements, but still concerned for safety at home, given she lives alone; She tells me she is "resilient"; Still worth considering SNF for post-acute rehab at [place]; Given her insurance, is there a way to confirm that she will have PT services at SNF?  If she can't get PT/OT services at SNF, would consider solidifying HHAide and transportation services before going home  Follow Up Recommendations  SNF;Supervision/Assistance - 24 hour;Other (comment)(pt lives alone and has no one to assist her upon d/c)     Equipment Recommendations  Rolling walker with 5" wheels    Recommendations for Other Services OT consult(will order per protocol)     Precautions / Restrictions Precautions Precautions: Fall Precaution Comments: sycopal event, watch vitals  Restrictions Other Position/Activity Restrictions: Pt indicated someone told her to keep weight off her Left ankle at some point (MD?); We discussed using RW to [place] her L LE with taking steps and elevating LLE when sitting    Mobility  Bed Mobility Overal bed mobility: Needs  Assistance Bed Mobility: Supine to Sit     Supine to sit: Min assist     General bed mobility comments: Min hand held assist to pull to sit  Transfers Overall transfer level: Needs assistance Equipment used: Rolling walker (2 wheeled) Transfers: Sit to/from Stand Sit to Stand: Min assist         General transfer comment: increased time, good technique, min A for stability with transition and with pt's reported dizziness  Ambulation/Gait Ambulation/Gait assistance: Min guard Gait Distance (Feet): 15 Feet Assistive device: Rolling walker (2 wheeled) Gait Pattern/deviations: Decreased stance time - left Gait velocity: slowed   General Gait Details: Cues to use RW to [employer] painful L ankle   Stairs             Wheelchair Mobility    Modified Rankin (Stroke Patients Only)       Balance     Sitting balance-Leahy Scale: Good       Standing balance-Leahy Scale: Poor(approaching Fair)                              Cognition Arousal/Alertness: Awake/alert Behavior During Therapy: WFL for tasks assessed/performed Overall Cognitive Status: Within Functional Limits for tasks assessed                                 General Comments: Just awakened at beginning of PT session; much more awake, conversational at end of session      Exercises  General Comments General comments (skin integrity, edema, etc.): Orthostatics as follows:   01/24/18 0759  Orthostatic Lying   BP- Lying 122/79  Pulse- Lying 69  Orthostatic Sitting  BP- Sitting 133/82  Pulse- Sitting 72  Orthostatic Standing at 0 minutes  BP- Standing at 0 minutes 104/72  Pulse- Standing at 0 minutes 77  Orthostatic Standing at 3 minutes  BP- Standing at 3 minutes 105/71  Pulse- Standing at 3 minutes 80    Session conducted on Room Air and O2 sats remained greater than or equal to 93%      Pertinent Vitals/Pain Pain Assessment: Faces Faces Pain Scale: Hurts a  little bit Pain Location: L ankle  Pain Descriptors / Indicators: Sore;Throbbing;Aching Pain Intervention(s): Monitored during session    Home Living                      Prior Function            PT Goals (current goals can now be found in the care plan section) Acute Rehab PT Goals Patient Stated Goal: Agreeable for Orthostatics and Pulse Ox readings with activity PT Goal Formulation: With patient Time For Goal Achievement: 02/05/18 Potential to Achieve Goals: Fair Progress towards PT goals: Progressing toward goals    Frequency    Min 3X/week      PT Plan Discharge plan needs to be updated    Co-evaluation              AM-PAC PT "6 Clicks" Daily Activity  Outcome Measure  Difficulty turning over in bed (including adjusting bedclothes, sheets and blankets)?: None Difficulty moving from lying on back to sitting on the side of the bed? : None Difficulty sitting down on and standing up from a chair with arms (e.g., wheelchair, bedside commode, etc,.)?: Unable Help needed moving to and from a bed to chair (including a wheelchair)?: A Little Help needed walking in hospital room?: A Little Help needed climbing 3-5 steps with a railing? : A Lot 6 Click Score: 17    End of Session Equipment Utilized During Treatment: Gait belt Activity Tolerance: Patient tolerated treatment well;Other (comment)(hough sitll slightly sleepy and fatigued) Patient left: in chair;with call bell/phone within reach;with nursing/sitter in room Nurse Communication: Mobility status PT Visit Diagnosis: Unsteadiness on feet (R26.81);Other abnormalities of gait and mobility (R26.89);Muscle weakness (generalized) (M62.81);Pain Pain - Right/Left: Left Pain - part of body: Ankle and joints of foot     Time: 0756-0844(minuse approx 5 minutes to get a cotton gown) PT Time Calculation (min) (ACUTE ONLY): 48 min  Charges:  $Gait Training: 8-22 mins $Therapeutic Activity: 23-37 mins                      Roney Marion, PT  Acute Rehabilitation Services Pager [phone] Office [place] 01/24/2018, 12:52 PM

## 2018-01-24 NOTE — Plan of Care (Signed)
Discussed plan of care for the evening with patient.  Emphasized pain management and using the call button when assistance is needed.  Good teach back displayed.

## 2018-01-25 DIAGNOSIS — J810 Acute pulmonary edema: Secondary | ICD-10-CM

## 2018-01-25 LAB — GLUCOSE, CAPILLARY
Glucose-Capillary: 211 mg/dL — ABNORMAL HIGH (ref 70–99)
Glucose-Capillary: 103 mg/dL — ABNORMAL HIGH (ref 70–99)

## 2018-01-25 MED ORDER — INSULIN LISPRO 100 UNIT/ML (KWIKPEN): 3.0000 [IU] | PEN_INJECTOR | Freq: Three times a day (TID) | SUBCUTANEOUS | 0 refills | Status: DC

## 2018-01-25 MED ORDER — ESCITALOPRAM OXALATE 10 MG PO TABS: 10.0000 mg | ORAL_TABLET | Freq: Every day | ORAL | 0 refills | Status: DC

## 2018-01-25 MED ORDER — GABAPENTIN 300 MG PO CAPS: 300.0000 mg | ORAL_CAPSULE | Freq: Every day | ORAL | 0 refills | Status: DC

## 2018-01-25 MED ORDER — BLOOD GLUCOSE MONITOR KIT: PACK | 0 refills | Status: DC

## 2018-01-25 NOTE — Progress Notes (Signed)
Pt seen by MD, orders written for d/c.  Went over discharge instructions with pt and answered all questions.  Removed IV, no complications.  Escorted for discharge via wheelchair with all belongings.  [facility] brought home equipment.  Will follow up outpatient with MD.
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
## 2018-01-25 NOTE — Progress Notes (Addendum)
Physical Therapy Treatment Patient Details Name: Lauren Warner MRN: [id number] DOB: 1965-08-18 Today's Date: 01/25/2018    History of Present Illness Pt is a 52 y/o female with a history of end-stage renal disease , CHF, on home oxygen 2 L at baseline, hypertension, history of PE who presents to the ED today via EMS , after missing hemodialysis Tuesday  . She complained of shortness of breath and chest pain. Oxygen saturation was 78% on room air. She was placed on BiPAP and sent to inpatient hemodialysis for emergent dialysis.     PT Comments    Pt agreeable to ambulation today prior to d/c. Pt continues to be limited in safe mobility by positional orthostasis (see General Comments). Pt educated on need to allow body to acclimate to positional change before moving. PT recommends Rollator instead of RW so pt has a safe place to sit if she begins to feel dizzy. Pt will benefit from HHPT for increasing mobility with impaired R ankle and improve overall safety with gait.      Follow Up Recommendations  Supervision/Assistance - 24 hour;Other (comment);Home health PT(HH Aide )     Equipment Recommendations  Other (comment)(Rollator to provide safe sitting if she becomes orthostatic )    Recommendations for Other Services OT consult(will order per protocol)     Precautions / Restrictions Precautions Precautions: Fall Precaution Comments: sycopal event, watch vitals  Restrictions Weight Bearing Restrictions: No Other Position/Activity Restrictions: Pt indicated someone told her to keep weight off her Left ankle at some point (MD?); We discussed using RW to [place] her L LE with taking steps and elevating LLE when sitting    Mobility  Bed Mobility Overal bed mobility: Modified Independent             General bed mobility comments: OOB in recliner  Transfers Overall transfer level: Needs assistance Equipment used: Rolling walker (2 wheeled) Transfers: Sit to/from Stand Sit to  Stand: Min guard         General transfer comment: min guard for safety, increased time and effort, no reports of dizziness in standing  Ambulation/Gait Ambulation/Gait assistance: Min guard Gait Distance (Feet): 50 Feet Assistive device: Rolling walker (2 wheeled) Gait Pattern/deviations: Decreased stance time - left Gait velocity: slowed Gait velocity interpretation: <1.8 ft/sec, indicate of risk for recurrent falls General Gait Details: min guard for safety, slowed ambulation, vc to use RW to unweigh painful L ankle      Balance Overall balance assessment: Needs assistance Sitting-balance support: Feet supported;No upper extremity supported Sitting balance-Leahy Scale: Good     Standing balance support: During functional activity;Single extremity supported Standing balance-Leahy Scale: Fair                              Cognition Arousal/Alertness: Awake/alert Behavior During Therapy: WFL for tasks assessed/performed;Flat affect Overall Cognitive Status: Within Functional Limits for tasks assessed Area of Impairment: Problem solving                             Problem Solving: Slow processing General Comments: Just awakened at beginning of PT session; much more awake, conversational at end of session         General Comments General comments (skin integrity, edema, etc.): Pt on RA and continues to have an orthostatic response to positional change. Sitting: BP 144/68, SaO2 94%O2, HR 78, standing BP 115/63, SaO2 96%O2, HR 81,  after ambulation 104/65, SaO2 96%O2, HR 81, in sitting after activity, BP 116/61, SaO2 91%O2, HR 83      Pertinent Vitals/Pain Pain Assessment: Faces Faces Pain Scale: Hurts a little bit Pain Location: L ankle  Pain Descriptors / Indicators: Sore;Throbbing;Aching Pain Intervention(s): Limited activity within patient's tolerance;Monitored during session;Repositioned    Home Living Family/patient expects to be discharged  to:: Private residence Living Arrangements: Alone Available Help at Discharge: Other (Comment)(reports she has been approved for [facility] aide doesn't have yet) Type of Home: Apartment Home Access: Level entry   Home Layout: One level Home Equipment: Toilet riser;Bedside commode Additional Comments: Sees a podiatrist in [facility] re: L foot/ankle fracture; she tells me she has been putting full weight on her L foot recently    Prior Function Level of Independence: Independent      Comments: States ADL are difficult especially on dialysis days due to fatigue   PT Goals (current goals can now be found in the care plan section) Acute Rehab PT Goals Patient Stated Goal: Agreeable for Orthostatics and Pulse Ox readings with activity PT Goal Formulation: With patient Time For Goal Achievement: 02/05/18 Potential to Achieve Goals: Fair Progress towards PT goals: Progressing toward goals    Frequency    Min 3X/week      PT Plan Discharge plan needs to be updated       AM-PAC PT "6 Clicks" Daily Activity  Outcome Measure  Difficulty turning over in bed (including adjusting bedclothes, sheets and blankets)?: None Difficulty moving from lying on back to sitting on the side of the bed? : None Difficulty sitting down on and standing up from a chair with arms (e.g., wheelchair, bedside commode, etc,.)?: Unable Help needed moving to and from a bed to chair (including a wheelchair)?: A Little Help needed walking in hospital room?: A Little Help needed climbing 3-5 steps with a railing? : A Lot 6 Click Score: 17    End of Session Equipment Utilized During Treatment: Gait belt Activity Tolerance: Patient tolerated treatment well;Other (comment)(hough sitll slightly sleepy and fatigued) Patient left: in chair;with call bell/phone within reach;with nursing/sitter in room Nurse Communication: Mobility status PT Visit Diagnosis: Unsteadiness on feet (R26.81);Other abnormalities of gait and  mobility (R26.89);Muscle weakness (generalized) (M62.81);Pain Pain - Right/Left: Left Pain - part of body: Ankle and joints of foot     Time: 5498-2641 PT Time Calculation (min) (ACUTE ONLY): 34 min  Charges:  $Gait Training: 23-37 mins                     Amelianna Meller B. Migdalia Dk PT, DPT Acute Rehabilitation  [phone] Pager [phone]     [place] 01/25/2018, 11:42 AM

## 2018-01-25 NOTE — Discharge Summary (Signed)
[facility]  Physician Discharge Summary   Patient ID: Lauren Warner MRN: [id number] DOB/AGE: 08-11-1965 52 y.o.  Admit date: 01/21/2018 Discharge date: 01/25/2018  PCP: [name], [name]  DISCHARGE DIAGNOSES:  Fluid overload due to noncompliance End-stage renal disease on hemodialysis   RECOMMENDATIONS FOR OUTPATIENT FOLLOW UP: 1. Patient to resume usual hemodialysis schedule  2. Home health has been ordered  DISCHARGE CONDITION: fair  Diet recommendation: As before  Filed Weights   01/22/18 1145 01/23/18 0335 01/23/18 0933  Weight: 96.7 kg 100 kg 95.2 kg    INITIAL HISTORY: Patient is a 52 year old female with a history of end-stage renal disease , CHF, on home oxygen 2 L at baseline, hypertension, history of PE who presented to the ED via EMS, after missing hemodialysis Tuesday. She complained of shortness of breath and chest pain. Oxygen saturation was 78% on room air. She was placed on BiPAP and sent to inpatient hemodialysis for emergent dialysis.   Consultations:  Nephrology  Procedures: Hemodialysis   HOSPITAL COURSE:   Acuterespiratory failurewith hypoxia Presented with volume overload because she missed 2 sessions of dialysis. The transport company did not pick her up. Her respiratory status improved after 2 sessions of dialysis. Nephrology cleared her for discharge.  She was initially thought to require home oxygen however after repeat testing she was saturating greater than 90% even with ambulation.  Oxygen at home currently.    Left lower extremity swelling/ankle pain: She has a prior history of PE currently not on anticoagulation. Venous Doppler negative for DVT. X-ray of the left ankle done which showed unchangeddorsal navicular and anterior talus fragmentation when compared to May 2019,soft tissue swelling. No need for acute intervention.  Seen by physical therapy and initially recommended skilled nursing facility.  Patient  declines.  Wishes to go home instead.  Home health has been ordered.  ESRD on hemodialysis Seen by nephrology.  Dialyzed.    Hypertensive emergency BP has improved. Resume home medications   Orthostatic hypotension  Appears to have improved.  Blood pressure does drop when she goes from lying to standing position however not significantly so.  She has been told to get up slowly.  Diabetes mellitus Continue home regimen  Coronary artery disease he patient is on aspirin and Plavix which will be continued  Nausea/vomiting/diarrhea: Resolved  Overall improved.  Okay for discharge home.   PERTINENT LABS:  The results of significant diagnostics from this hospitalization (including imaging, microbiology, ancillary and laboratory) are listed below for reference.     Labs: Basic Metabolic Panel: Recent Labs  Lab 01/21/18 0406 01/21/18 0954 01/21/18 1957 01/22/18 0241  NA 140 141  --  136  K 6.9* 7.2*  --  4.7  CL 103 104  --  99  CO2 19* 22  --  25  GLUCOSE 164* 85  --  243*  BUN 117* 115*  --  50*  CREATININE 12.20* 12.11*  --  6.54*  CALCIUM 7.4* 7.4*  --  7.7*  MG  --   --  2.2  --   PHOS  --  10.3*  --   --    Liver Function Tests: Recent Labs  Lab 01/21/18 0954 01/22/18 0241  AST  --  10*  ALT  --  22  ALKPHOS  --  121  BILITOT  --  0.7  PROT  --  6.8  ALBUMIN 3.3* 3.2*   CBC: Recent Labs  Lab 01/21/18 0406 01/22/18 0241  WBC 6.7 4.5  NEUTROABS 4.6  --   HGB 9.7* 9.5*  HCT 32.0* 31.5*  MCV 77.5* 77.4*  PLT 170 182   Cardiac Enzymes: Recent Labs  Lab 01/21/18 1957 01/21/18 2252 01/22/18 0241  TROPONINI 0.04* 0.04* 0.04*   CBG: Recent Labs  Lab 01/24/18 1153 01/24/18 1719 01/24/18 2042 01/25/18 0759 01/25/18 1204  GLUCAP 107* 138* 211* 211* 103*     IMAGING STUDIES Dg Ankle 2 Views Left  Result Date: 01/22/2018 CLINICAL DATA:  Atraumatic left ankle pain and swelling EXAM: LEFT ANKLE - 2 VIEW COMPARISON:  10/18/2017  FINDINGS: Chronic fragmentation of the dorsal navicular and anterior talus with regional soft tissue swelling. No progressive progression as expected for osteomyelitis. Charcot joint is not excluded in this patient with diabetes. There is osteopenia and atherosclerosis. IMPRESSION: 1. Similar-appearing dorsal navicular and anterior talus fragmentation when compared to May 2019. Lack of progression argues against infection, but Charcot joint is still considered. 2. Extensive soft tissue swelling. Electronically Signed   By: [name]   On: 01/22/2018 14:04   Ct Head Wo Contrast  Result Date: 01/07/2018 CLINICAL DATA:  Altered mental status. EXAM: CT HEAD WITHOUT CONTRAST TECHNIQUE: Contiguous axial images were obtained from the base of the skull through the vertex without intravenous contrast. COMPARISON:  CT head dated December 22, 2017. FINDINGS: Brain: No evidence of acute infarction, hemorrhage, hydrocephalus, extra-axial collection or mass lesion/mass effect. Vascular: Calcified atherosclerosis at the skullbase. No hyperdense vessel. Skull: Normal. Negative for fracture or focal lesion. Sinuses/Orbits: No acute finding. Unchanged bilateral maxillary sinus mucosal thickening. Other: None. IMPRESSION: 1.  No acute intracranial abnormality. Electronically Signed   By: Titus Dubin M.D.   On: 01/07/2018 16:03   Dg Pelvis Portable  Result Date: 01/08/2018 CLINICAL DATA:  Acute onset of lower back pain. EXAM: PORTABLE PELVIS 1-2 VIEWS COMPARISON:  CT of the abdomen and pelvis from 12/22/2017 FINDINGS: There is no evidence of fracture or dislocation. Evaluation is mildly suboptimal due to patient rotation. Both femoral heads are seated normally within their respective acetabula. No significant degenerative change is appreciated. The sacroiliac joints are unremarkable in appearance. The visualized bowel gas pattern is grossly unremarkable in appearance. IMPRESSION: No evidence of fracture or dislocation.  Electronically Signed   By: Garald Balding M.D.   On: 01/08/2018 03:35   Dg Chest Port 1 View  Result Date: 01/21/2018 CLINICAL DATA:  Shortness of breath. EXAM: PORTABLE CHEST 1 VIEW COMPARISON:  01/07/2018 FINDINGS: Unchanged cardiomegaly. Slight worsening of pulmonary edema since prior exam. Patchy bibasilar opacities may be vascular or atelectasis. Suspect small right pleural effusion. No pneumothorax. IMPRESSION: Cardiomegaly with increased pulmonary edema since prior exam and possible right pleural effusion. Electronically Signed   By: Jeb Levering M.D.   On: 01/21/2018 04:23   Dg Chest Port 1 View  Result Date: 01/07/2018 CLINICAL DATA:  AMS EXAM: PORTABLE CHEST 1 VIEW COMPARISON:  12/22/2017 FINDINGS: The heart is enlarged. There are prominent interstitial markings consistent with interstitial edema. No focal consolidations or overt alveolar edema. IMPRESSION: Cardiomegaly and interstitial pulmonary edema. Electronically Signed   By: Nolon Nations M.D.   On: 01/07/2018 15:36    DISCHARGE EXAMINATION: Vitals:   01/24/18 0843 01/24/18 1723 01/25/18 0017 01/25/18 1205  BP: (!) 164/91 (!) 171/89 (!) 141/71 133/68  Pulse: 76 72 73 72  Resp:   19 18  Temp: 98 F (36.7 C) 98.1 F (36.7 C) 99.3 F (37.4 C) 98.7 F (37.1 C)  TempSrc: Oral Oral Oral  Oral  SpO2: 97% 100% 92% 94%  Weight:      Height:       General appearance: alert, cooperative, appears stated age and no distress Resp: clear to auscultation bilaterally Cardio: regular rate and rhythm, S1, S2 normal, no murmur, click, rub or gallop GI: soft, non-tender; bowel sounds normal; no masses,  no organomegaly  DISPOSITION: Home  Discharge Instructions    Call MD for:  extreme fatigue   Complete by:  As directed    Call MD for:  persistant dizziness or light-headedness   Complete by:  As directed    Call MD for:  persistant nausea and vomiting   Complete by:  As directed    Call MD for:  severe uncontrolled pain    Complete by:  As directed    Call MD for:  temperature >100.4   Complete by:  As directed    Diet - low sodium heart healthy   Complete by:  As directed    Diet - low sodium heart healthy   Complete by:  As directed    Discharge instructions   Complete by:  As directed    Please go for dialysis as per your usual schedule.  Take your medications as prescribed.  You were cared for by a hospitalist during your hospital stay. If you have any questions about your discharge medications or the care you received while you were in the hospital after you are discharged, you can call the unit and asked to speak with the hospitalist on call if the hospitalist that took care of you is not available. Once you are discharged, your primary care physician will handle any further medical issues. Please note that NO REFILLS for any discharge medications will be authorized once you are discharged, as it is imperative that you return to your primary care physician (or establish a relationship with a primary care physician if you do not have one) for your aftercare needs so that they can reassess your need for medications and monitor your lab values. If you do not have a primary care physician, you can call [phone] for a physician referral.   Increase activity slowly   Complete by:  As directed    Increase activity slowly   Complete by:  As directed    Increase activity slowly   Complete by:  As directed         Allergies as of 01/25/2018      Reactions   Adhesive [tape] Other (See Comments)   "RIPS MY SKIN," [place]!!   Hydrocodone-acetaminophen Other (See Comments)   "Makes me feel crazy"   Metformin And Related Other (See Comments)   "Makes me feel crazy"      Medication List    STOP taking these medications   insulin lispro 100 UNIT/ML injection Commonly known as:  HUMALOG Replaced by:  insulin lispro 100 UNIT/ML KiwkPen     TAKE these medications   albuterol 108 (90 Base)  MCG/ACT inhaler Commonly known as:  PROVENTIL HFA;VENTOLIN HFA Inhale 2 puffs into the lungs every 6 (six) hours as needed for wheezing or shortness of breath.   amLODipine 10 MG tablet Commonly known as:  NORVASC Take 1 tablet (10 mg total) by mouth daily. What changed:  how much to take   aspirin EC 81 MG tablet Take 1 tablet (81 mg total) by mouth daily. With food   blood glucose meter kit and supplies Kit Dispense based on patient  and insurance preference. Use up to four times daily as directed. (FOR ICD-9 250.00, 250.01).   calcium acetate 667 MG capsule Commonly known as:  PHOSLO Take 1 capsule (667 mg total) by mouth 3 (three) times daily with meals.   carvedilol 6.25 MG tablet Commonly known as:  COREG Take 1 tablet (6.25 mg total) by mouth 2 (two) times daily with a meal.   Chlorhexidine Gluconate Cloth 2 % Pads Apply 6 each topically daily at 6 (six) AM.   clopidogrel 75 MG tablet Commonly known as:  PLAVIX Take 1 tablet (75 mg total) by mouth daily.   escitalopram 10 MG tablet Commonly known as:  LEXAPRO Take 1 tablet (10 mg total) by mouth daily.   feeding supplement (NEPRO CARB STEADY) Liqd Take 237 mLs by mouth 2 (two) times daily between meals.   gabapentin 300 MG capsule Commonly known as:  NEURONTIN Take 1 capsule (300 mg total) by mouth at bedtime.   heparin 1000 unit/mL Soln injection 4 mLs (4,000 Units total) by Dialysis route one time in dialysis for 1 dose.   insulin lispro 100 UNIT/ML KiwkPen Commonly known as:  HUMALOG Inject 0.03 mLs (3 Units total) into the skin 3 (three) times daily. Replaces:  insulin lispro 100 UNIT/ML injection   latanoprost 0.005 % ophthalmic solution Commonly known as:  XALATAN Place 1 drop into both eyes at bedtime.   multivitamin Tabs tablet Take 1 tablet by mouth at bedtime.   ondansetron 4 MG tablet Commonly known as:  ZOFRAN Take 1 tablet (4 mg total) by mouth every 8 (eight) hours as needed for nausea or  vomiting.   sevelamer carbonate 800 MG tablet Commonly known as:  RENVELA Take 3 tablets (2,400 mg total) by mouth 3 (three) times daily with meals.            Durable Medical Equipment  (From admission, onward)         Start     Ordered   01/25/18 1058  For home use only DME Tub bench  Once     01/25/18 1058   01/25/18 1057  For home use only DME 4 wheeled rolling walker with seat  Once    Question:  Patient needs a walker to treat with the following condition  Answer:  Weakness   01/25/18 1056   01/24/18 1530  For home use only DME 3 n 1  Once     01/24/18 1530           Follow-up Information    [place], Brennan Bailey, [name]. Schedule an appointment as soon as possible for a visit in 1 week(s).   Specialty:  Internal Medicine Contact information: [address] [place] [address] [place]. Call.   Specialty:  Professional Counselor Why:  Call to make appointment for therapy services. Contact information: [facility] [address] [phone]        Care, [facility] Follow up.   Specialty:  Home Health Services Why:  HHRN, HHPT, HHOT, HHAIDE, [place] social worker Contact information: [place] [address] [phone]           TOTAL DISCHARGE TIME: 12 minutes  Bonnielee Haff  [facility] Pager [phone]  01/25/2018, 3:16 PM

## 2018-01-25 NOTE — Progress Notes (Signed)
OT Treatment Note    01/25/18 1300  OT Visit Information  Last OT Received On 01/25/18  Assistance Needed +1  History of Present Illness Pt is a 52 y/o female with a history of end-stage renal disease , CHF, on home oxygen 2 L at baseline, hypertension, history of PE who presents to the ED today via EMS , after missing hemodialysis Tuesday  . She complained of shortness of breath and chest pain. Oxygen saturation was 78% on room air. She was placed on BiPAP and sent to inpatient hemodialysis for emergent dialysis.   Precautions  Precautions Fall  Pain Assessment  Pain Assessment Faces  Faces Pain Scale 2  Pain Location L ankle   Pain Descriptors / Indicators Sore;Throbbing;Aching  Pain Intervention(s) Limited activity within patient's tolerance  Cognition  Arousal/Alertness Awake/alert  Behavior During Therapy WFL for tasks assessed/performed;Flat affect  Overall Cognitive Status Within Functional Limits for tasks assessed  Upper Extremity Assessment  Upper Extremity Assessment Overall WFL for tasks assessed  Lower Extremity Assessment  Lower Extremity Assessment Defer to PT evaluation  ADL  General ADL Comments Educated pt on energy conservation techniques for ADL adn IADL tasks; Issued reacher and long handled sponge to assist wtih ADL. Educated on use of rollator to help with fatigue during ADL/ IADL and community mobility.  Bed Mobility  General bed mobility comments OOB in recliner  Transfers  Overall transfer level Needs assistance  Transfers Sit to/from Stand  Sit to Stand Supervision  OT - End of Session  Activity Tolerance Patient tolerated treatment well  Patient left in chair;with call bell/phone within reach  Nurse Communication Mobility status  OT Assessment/Plan  OT Plan Discharge plan remains appropriate  OT Visit Diagnosis Muscle weakness (generalized) (M62.81);Other abnormalities of gait and mobility (R26.89);Pain  Pain - Right/Left Left  Pain - part of body  Ankle and joints of foot  OT Frequency (ACUTE ONLY) Min 2X/week  Follow Up Recommendations Home health OT;Other (comment)  OT Equipment Tub/shower bench;Other (comment)  AM-PAC OT "6 Clicks" Daily Activity Outcome Measure  Help from another person eating meals? 4  Help from another person taking care of personal grooming? 4  Help from another person toileting, which includes using toliet, bedpan, or urinal? 4  Help from another person bathing (including washing, rinsing, drying)? 4  Help from another person to put on and taking off regular upper body clothing? 4  Help from another person to put on and taking off regular lower body clothing? 4  6 Click Score 24  ADL G Code Conversion CH  OT Goal Progression  Progress towards OT goals Goals met/education completed, patient discharged from OT (continue with [place])  Acute Rehab OT Goals  Patient Stated Goal to go home  OT Goal Formulation With patient  Time For Goal Achievement 02/01/18  Potential to Achieve Goals Good  OT Time Calculation  OT Start Time (ACUTE ONLY) 1212  OT Stop Time (ACUTE ONLY) 1225  OT Time Calculation (min) 13 min  OT General Charges  $OT Visit 1 Visit  OT Treatments  $Self Care/Home Management  8-22 mins  Maurie Boettcher, OT/L  OT Clinical Specialist [phone]

## 2018-01-25 NOTE — Progress Notes (Signed)
[facility] Progress Note  Subjective: feels back to baseline, ready for D/C home.     Vitals:   01/24/18 0028 01/24/18 0843 01/24/18 1723 01/25/18 0017  BP: (!) 163/83 (!) 164/91 (!) 171/89 (!) 141/71  Pulse: 74 76 72 73  Resp: 20   19  Temp: 98.5 F (36.9 C) 98 F (36.7 C) 98.1 F (36.7 C) 99.3 F (37.4 C)  TempSrc: Oral Oral Oral Oral  SpO2: 96% 97% 100% 92%  Weight:      Height:        Inpatient medications: . amLODipine  5 mg Oral Daily  . aspirin EC  81 mg Oral Q breakfast  . calcium acetate  667 mg Oral TID WC  . carvedilol  6.25 mg Oral BID WC  . Chlorhexidine Gluconate Cloth  6 each Topical Q0600  . clopidogrel  75 mg Oral Daily  . escitalopram  10 mg Oral Daily  . feeding supplement (NEPRO CARB STEADY)  237 mL Oral BID BM  . gabapentin  300 mg Oral QHS  . heparin  4,000 Units Dialysis Once in dialysis  . heparin  5,000 Units Subcutaneous Q8H  . insulin aspart  0-15 Units Subcutaneous TID WC  . insulin aspart  2 Units Subcutaneous TID WC  . latanoprost  1 drop Both Eyes QHS  . sevelamer carbonate  2,400 mg Oral TID WC    acetaminophen **OR** acetaminophen, albuterol, alum & mag hydroxide-simeth, hydrALAZINE, ondansetron **OR** ondansetron (ZOFRAN) IV  Iron/TIBC/Ferritin/ %Sat No results found for: IRON, TIBC, FERRITIN, IRONPCTSAT  Exam: Gen on HD, no distress, calm Chest clear bilat RRR no MRG GU defer MS no joint effusions or deformity Ext no edema, LUE AVF + thrill and bruit Neuro is alert, Ox 3 , nf    Home meds:  - amlodipine 10 qd/ carvedilol 6.25 bid  - ecasa 81 qd/ clopidogrel 75 qd  - escitalopram 10 qd/ gabapentin 300 hs/ prn zofran  - insulin lispro 2u tid ac if bs > 110  - alb HFA prn  - calcium acetate 1 tid ac/ sevelamer carb 2400 tid ac  Dialysis: TTS SW  4h   94.5kg  2/2.25 bath  LUA AVF  Hep 4000  - hect 1 ug  - completed venofer 100mg  x 10 on 12/10/17  - last mircera 100 on 7/11, last Hb 8.7 on  7/30     Impression: 1. Orthostatic BP drop - this is likely due to autonomic neuropathy. Suspect this is her baseline w/ long-standing type 1 DM.  Would not recommend therapy (midodrine) given lowest standing BP's are in the 100's.  Not vol depleted. OK for dc from renal standpoint.  2. Resp distress/ pulm edema / vol overload - resolved. Will keep the same edw for now.  3. Noncompliance - missed HD x 3 in 2 wks, she knows to call her transport company and see what can be done to prevent missed rides in the future. She is thinking about changing company as well as est a video doorbell so this doesn't happen again.  4. ESRD on HD TTS - started in [place] early 2018. Next HD on Tuesday as OP.  5. HTN urgency - resolved 6. Depression - recent admit for SI, resolved and seen by psych 7. DM on insulin 8. MBD ckd - cont binders, vdra 9. Anemia ckd - Hb 9.7 on 8/23  Plan - as above   Jannifer Hick  MD [facility] pager [phone] 01/25/2018, 8:17  AM   Recent Labs  Lab 01/21/18 0954 01/22/18 0241  NA 141 136  K 7.2* 4.7  CL 104 99  CO2 22 25  GLUCOSE 85 243*  BUN 115* 50*  CREATININE 12.11* 6.54*  CALCIUM 7.4* 7.7*  PHOS 10.3*  --   ALBUMIN 3.3* 3.2*   Recent Labs  Lab 01/22/18 0241  AST 10*  ALT 22  ALKPHOS 121  BILITOT 0.7  PROT 6.8   Recent Labs  Lab 01/21/18 0406 01/22/18 0241  WBC 6.7 4.5  NEUTROABS 4.6  --   HGB 9.7* 9.5*  HCT 32.0* 31.5*  MCV 77.5* 77.4*  PLT 170 182

## 2018-02-16 ENCOUNTER — Encounter (HOSPITAL_COMMUNITY): Payer: Self-pay

## 2018-02-16 ENCOUNTER — Non-Acute Institutional Stay (HOSPITAL_COMMUNITY)
Admission: EM | Admit: 2018-02-16 | Discharge: 2018-02-17 | Disposition: A | Discharge status: Home / Self Care | Payer: Medicaid Other | Attending: Emergency Medicine | Admitting: Emergency Medicine

## 2018-02-16 ENCOUNTER — Emergency Department (HOSPITAL_COMMUNITY): Payer: Medicaid Other

## 2018-02-16 ENCOUNTER — Other Ambulatory Visit: Payer: Self-pay

## 2018-02-16 DIAGNOSIS — I2510 Atherosclerotic heart disease of native coronary artery without angina pectoris: Secondary | ICD-10-CM | POA: Insufficient documentation

## 2018-02-16 DIAGNOSIS — E1022 Type 1 diabetes mellitus with diabetic chronic kidney disease: Secondary | ICD-10-CM | POA: Diagnosis not present

## 2018-02-16 DIAGNOSIS — Z7982 Long term (current) use of aspirin: Secondary | ICD-10-CM | POA: Insufficient documentation

## 2018-02-16 DIAGNOSIS — Z7902 Long term (current) use of antithrombotics/antiplatelets: Secondary | ICD-10-CM | POA: Diagnosis not present

## 2018-02-16 DIAGNOSIS — R531 Weakness: Secondary | ICD-10-CM | POA: Diagnosis present

## 2018-02-16 DIAGNOSIS — I509 Heart failure, unspecified: Secondary | ICD-10-CM | POA: Diagnosis not present

## 2018-02-16 DIAGNOSIS — N186 End stage renal disease: Secondary | ICD-10-CM | POA: Diagnosis present

## 2018-02-16 DIAGNOSIS — Z955 Presence of coronary angioplasty implant and graft: Secondary | ICD-10-CM | POA: Diagnosis not present

## 2018-02-16 DIAGNOSIS — F329 Major depressive disorder, single episode, unspecified: Secondary | ICD-10-CM | POA: Insufficient documentation

## 2018-02-16 DIAGNOSIS — Z992 Dependence on renal dialysis: Secondary | ICD-10-CM | POA: Diagnosis not present

## 2018-02-16 DIAGNOSIS — F419 Anxiety disorder, unspecified: Secondary | ICD-10-CM | POA: Insufficient documentation

## 2018-02-16 DIAGNOSIS — Z9981 Dependence on supplemental oxygen: Secondary | ICD-10-CM | POA: Diagnosis not present

## 2018-02-16 DIAGNOSIS — J45909 Unspecified asthma, uncomplicated: Secondary | ICD-10-CM | POA: Insufficient documentation

## 2018-02-16 DIAGNOSIS — Z86711 Personal history of pulmonary embolism: Secondary | ICD-10-CM | POA: Insufficient documentation

## 2018-02-16 DIAGNOSIS — Z79899 Other long term (current) drug therapy: Secondary | ICD-10-CM | POA: Insufficient documentation

## 2018-02-16 DIAGNOSIS — I132 Hypertensive heart and chronic kidney disease with heart failure and with stage 5 chronic kidney disease, or end stage renal disease: Secondary | ICD-10-CM | POA: Insufficient documentation

## 2018-02-16 DIAGNOSIS — E875 Hyperkalemia: Secondary | ICD-10-CM

## 2018-02-16 DIAGNOSIS — Z794 Long term (current) use of insulin: Secondary | ICD-10-CM | POA: Insufficient documentation

## 2018-02-16 LAB — CBC WITH DIFFERENTIAL/PLATELET
ABS IMMATURE GRANULOCYTES: 0 10*3/uL (ref 0.0–0.1)
Basophils Absolute: 0 10*3/uL (ref 0.0–0.1)
Basophils Relative: 1 %
Eosinophils Absolute: 0.2 10*3/uL (ref 0.0–0.7)
Eosinophils Relative: 5 %
HCT: 29.6 % — ABNORMAL LOW (ref 36.0–46.0)
Hemoglobin: 9.1 g/dL — ABNORMAL LOW (ref 12.0–15.0)
Immature Granulocytes: 1 %
Lymphocytes Relative: 27 %
Lymphs Abs: 1.4 10*3/uL (ref 0.7–4.0)
MCH: 24.1 pg — ABNORMAL LOW (ref 26.0–34.0)
MCHC: 30.7 g/dL (ref 30.0–36.0)
MCV: 78.3 fL (ref 78.0–100.0)
Monocytes Absolute: 0.5 10*3/uL (ref 0.1–1.0)
Monocytes Relative: 9 %
NEUTROS ABS: 2.9 10*3/uL (ref 1.7–7.7)
Neutrophils Relative %: 57 %
PLATELETS: 245 10*3/uL (ref 150–400)
RBC: 3.78 MIL/uL — AB (ref 3.87–5.11)
RDW: 17.4 % — ABNORMAL HIGH (ref 11.5–15.5)
WBC: 5 10*3/uL (ref 4.0–10.5)

## 2018-02-16 LAB — COMPREHENSIVE METABOLIC PANEL
ALBUMIN: 3.2 g/dL — AB (ref 3.5–5.0)
ALT: 10 U/L (ref 0–44)
AST: 10 U/L — ABNORMAL LOW (ref 15–41)
Alkaline Phosphatase: 88 U/L (ref 38–126)
Anion gap: 18 — ABNORMAL HIGH (ref 5–15)
BUN: 113 mg/dL — AB (ref 6–20)
CHLORIDE: 98 mmol/L (ref 98–111)
CO2: 21 mmol/L — AB (ref 22–32)
Calcium: 7.8 mg/dL — ABNORMAL LOW (ref 8.9–10.3)
Creatinine, Ser: 13.79 mg/dL — ABNORMAL HIGH (ref 0.44–1.00)
GFR calc Af Amer: 3 mL/min — ABNORMAL LOW (ref >60)
GFR calc non Af Amer: 3 mL/min — ABNORMAL LOW (ref >60)
GLUCOSE: 167 mg/dL — AB (ref 70–99)
POTASSIUM: 7.3 mmol/L — AB (ref 3.5–5.1)
SODIUM: 137 mmol/L (ref 135–145)
Total Bilirubin: 0.6 mg/dL (ref 0.3–1.2)
Total Protein: 6.9 g/dL (ref 6.5–8.1)

## 2018-02-16 LAB — LIPASE, BLOOD: Lipase: 100 U/L — ABNORMAL HIGH (ref 11–51)

## 2018-02-16 LAB — GLUCOSE, RANDOM: GLUCOSE: 227 mg/dL — AB (ref 70–99)

## 2018-02-16 LAB — TROPONIN I: Troponin I: <0.03 ng/mL (ref <0.03)

## 2018-02-16 MED ORDER — CALCIUM GLUCONATE 10 % IV SOLN
1.0000 g | Freq: Once | INTRAVENOUS | Status: AC
  Administered 2018-02-16: 1 g via INTRAVENOUS
  Filled 2018-02-16: qty 10

## 2018-02-16 MED ORDER — INSULIN ASPART 100 UNIT/ML IV SOLN
5.0000 [IU] | Freq: Once | INTRAVENOUS | Status: AC
  Administered 2018-02-16: 5 [IU] via INTRAVENOUS
  Filled 2018-02-16 (×2): qty 0.05

## 2018-02-16 MED ORDER — DEXTROSE 10 % IV SOLN
Freq: Once | INTRAVENOUS | Status: AC
  Administered 2018-02-16: 17:00:00 via INTRAVENOUS

## 2018-02-16 MED ORDER — ACETAMINOPHEN 500 MG PO TABS
1000.0000 mg | ORAL_TABLET | Freq: Once | ORAL | Status: AC
  Administered 2018-02-16: 1000 mg via ORAL
  Filled 2018-02-16: qty 2

## 2018-02-16 MED ORDER — CHLORHEXIDINE GLUCONATE CLOTH 2 % EX PADS: 6.0000 | MEDICATED_PAD | Freq: Every day | CUTANEOUS | Status: DC

## 2018-02-16 MED ORDER — SODIUM CHLORIDE 0.9 % IV SOLN: 100.0000 mL | INTRAVENOUS | Status: DC | PRN

## 2018-02-16 MED ORDER — PENTAFLUOROPROP-TETRAFLUOROETH EX AERO
1.0000 "application " | INHALATION_SPRAY | CUTANEOUS | Status: DC | PRN
  Filled 2018-02-16: qty 103.5

## 2018-02-16 MED ORDER — LIDOCAINE HCL (PF) 1 % IJ SOLN: 5.0000 mL | INTRAMUSCULAR | Status: DC | PRN

## 2018-02-16 MED ORDER — ONDANSETRON HCL 4 MG/2ML IJ SOLN
4.0000 mg | Freq: Once | INTRAMUSCULAR | Status: AC
  Administered 2018-02-16: 4 mg via INTRAVENOUS
  Filled 2018-02-16: qty 2

## 2018-02-16 MED ORDER — ALBUTEROL SULFATE (2.5 MG/3ML) 0.083% IN NEBU
10.0000 mg | INHALATION_SOLUTION | Freq: Once | RESPIRATORY_TRACT | Status: AC
  Administered 2018-02-16: 10 mg via RESPIRATORY_TRACT
  Filled 2018-02-16: qty 12

## 2018-02-16 MED ORDER — DEXTROSE 50 % IV SOLN
1.0000 | Freq: Once | INTRAVENOUS | Status: AC
  Administered 2018-02-16: 50 mL via INTRAVENOUS
  Filled 2018-02-16: qty 50

## 2018-02-16 MED ORDER — HEPARIN SODIUM (PORCINE) 1000 UNIT/ML DIALYSIS
1000.0000 [IU] | INTRAMUSCULAR | Status: DC | PRN
  Filled 2018-02-16: qty 1

## 2018-02-16 MED ORDER — LIDOCAINE-PRILOCAINE 2.5-2.5 % EX CREA
1.0000 "application " | TOPICAL_CREAM | CUTANEOUS | Status: DC | PRN
  Filled 2018-02-16: qty 5

## 2018-02-16 NOTE — ED Triage Notes (Addendum)
Pt BIB [facility] d/t missed dialysis x2. This AM Pt experienced N/V/D & weakness. Pt PCP informed Pt to contact EMS. Pt HD T,TH,Sat L.Arm Fistula  Hx HTN, CHF w/ stent.

## 2018-02-16 NOTE — Progress Notes (Signed)
Patient completed HD trmt without difficulty.  Tearful and anxious throughout treatment, reassurance and emotional support given.  Post trmt, patient transported via stretcher to ER for DC home.  Patient required ride assistance home, ED staff to assist with this.  Spoke with ED charge RN, Raquel Sarna prior to transport to ED.

## 2018-02-16 NOTE — Progress Notes (Signed)
Asked to see patient for dialysis, pt missed Sat HD and was having N/V/D and her PCP told her to go to ED.  In ED K+ is 7.3, no EKG changes.  CXR showed IS edema and bp's are up.   Pt on room air w/ SpO2 100%.  Will plan HD today 4h w/ low K+ bath, max UF 4-5 L,  OK for dc home after HD today.     Kelly Splinter MD [employer] pager [phone]   02/16/2018, 5:07 PM

## 2018-02-16 NOTE — ED Notes (Signed)
Pt care assumed, obtained verbal report.  Pt is lethargic, responds to verbal stimuli.  She is c/o back pain and states she was only given tylenol for pain without relief.

## 2018-02-16 NOTE — ED Notes (Signed)
Date and time results received: 02/16/18 1518 (use smartphrase ".now" to insert current time)  Test: K Critical Value: 7.3  Name of Provider Notified: Regenia Skeeter  Orders Received? Or Actions Taken?: Orders Received - See Orders for details

## 2018-02-16 NOTE — ED Notes (Signed)
ED Provider at bedside. 

## 2018-02-16 NOTE — ED Provider Notes (Signed)
[facility] EMERGENCY DEPARTMENT Provider Note   CSN: [id number] Arrival date & time: 02/16/18  1053     History   Chief Complaint Chief Complaint  Patient presents with  . Weakness    HPI Lauren Warner is a 52 y.o. female.  HPI  52 year old female with a history of CHF, end-stage renal disease on Tuesday/Thursday/Saturday dialysis, type 1 diabetes, and pulmonary embolism presents with vomiting, diarrhea, and shortness of breath.  She states that last night starting around 11 PM she started having vomiting and diarrhea with no blood in either.  Some generalized abdominal pain as well as thoracic back pain.  She is also developed shortness of breath.  Her last dialysis was on 9/12 and she missed her Saturday dialysis because she was at a funeral and out of town.  Her doctor was talking to her today and due to her symptoms told her to go to the ER and skip dialysis today.  When asked about chest pain she is been having continuous chest pain for a couple days, stating it feels like a reflux.  Past Medical History:  Diagnosis Date  . Anemia   . Anxiety   . Asthma   . CHF (congestive heart failure) ([place])   . Coronary artery disease   . Daily headache   . Depression   . ESRD (end stage renal disease) on dialysis ([place])    "[facility]; TTS" (10/19/2017)  . High cholesterol   . History of blood transfusion 10/19/2017   "anemia"  . Hyperkalemia 10/2017  . Hypertension   . On home oxygen therapy    "2L; daily" (10/19/2017)  . Pneumonia    "several times" (10/19/2017)  . Pulmonary embolism ([place]) 2012  . Type 1 diabetes mellitus [facility])     Patient Active Problem List   Diagnosis Date Noted  . SOB (shortness of breath) 01/22/2018  . Respiratory failure ([place]) 01/21/2018  . MDD (major depressive disorder), single episode, moderate ([place])   . Acute encephalopathy 01/07/2018  . Suicidal ideation 01/07/2018  . Hypertensive encephalopathy 12/23/2017  . Hypertensive  urgency 12/22/2017  . Syncope and collapse 12/03/2017  . Hyperkalemia 11/10/2017  . Pressure injury of skin 10/20/2017  . Essential hypertension 10/19/2017  . CAD (coronary artery disease) 10/19/2017  . ESRD (end stage renal disease) ([place]) 10/19/2017  . Syncope 10/19/2017  . Anemia due to end stage renal disease ([place]) 10/19/2017  . Closed left ankle fracture 10/19/2017  . Nausea vomiting and diarrhea 10/19/2017  . DM (diabetes mellitus), type 2 with renal complications ([place]) 21/19/4174  . Acute respiratory failure with hypoxia ([place]) 10/18/2017    Past Surgical History:  Procedure Laterality Date  . AV FISTULA PLACEMENT Left 2018  . CESAREAN SECTION  1989  . CORONARY ANGIOPLASTY WITH STENT PLACEMENT  ~ 08/2017   "3 stents" (10/19/2017)  . ENDOMETRIAL ABLATION  ~ 2011     OB History   None      Home Medications    Prior to Admission medications   Medication Sig Start Date End Date Taking? Authorizing Provider  albuterol (PROAIR HFA) 108 (90 Base) MCG/ACT inhaler Inhale 2 puffs into the lungs every 6 (six) hours as needed for wheezing or shortness of breath. 12/04/17   Roxan Hockey, MD  amLODipine (NORVASC) 10 MG tablet Take 1 tablet (10 mg total) by mouth daily. Patient taking differently: Take 5 mg by mouth daily.  12/24/17 01/23/18  Alma Friendly, MD  aspirin EC 81 MG tablet Take  1 tablet (81 mg total) by mouth daily. With food 12/04/17   Roxan Hockey, MD  blood glucose meter kit and supplies KIT Dispense based on patient and insurance preference. Use up to four times daily as directed. (FOR ICD-9 250.00, 250.01). 01/25/18   Bonnielee Haff, MD  calcium acetate (PHOSLO) 667 MG capsule Take 1 capsule (667 mg total) by mouth 3 (three) times daily with meals. 12/04/17   Roxan Hockey, MD  carvedilol (COREG) 6.25 MG tablet Take 1 tablet (6.25 mg total) by mouth 2 (two) times daily with a meal. 12/04/17   Denton Brick, Courage, MD  Chlorhexidine Gluconate Cloth 2 % PADS Apply 6  each topically daily at 6 (six) AM. 01/25/18   Cristal Deer, MD  clopidogrel (PLAVIX) 75 MG tablet Take 1 tablet (75 mg total) by mouth daily. 12/04/17   Roxan Hockey, MD  escitalopram (LEXAPRO) 10 MG tablet Take 1 tablet (10 mg total) by mouth daily. 01/25/18   Bonnielee Haff, MD  gabapentin (NEURONTIN) 300 MG capsule Take 1 capsule (300 mg total) by mouth at bedtime. 01/25/18   Bonnielee Haff, MD  heparin 1000 unit/mL SOLN injection 4 mLs (4,000 Units total) by Dialysis route one time in dialysis for 1 dose. 01/24/18 01/24/18  Cristal Deer, MD  insulin lispro (HUMALOG) 100 UNIT/ML KiwkPen Inject 0.03 mLs (3 Units total) into the skin 3 (three) times daily. 01/25/18   Bonnielee Haff, MD  latanoprost (XALATAN) 0.005 % ophthalmic solution Place 1 drop into both eyes at bedtime. 12/04/17   Roxan Hockey, MD  multivitamin (RENA-VIT) TABS tablet Take 1 tablet by mouth at bedtime. 12/04/17   Roxan Hockey, MD  Nutritional Supplements (FEEDING SUPPLEMENT, NEPRO CARB STEADY,) LIQD Take 237 mLs by mouth 2 (two) times daily between meals. 01/25/18   Cristal Deer, MD  ondansetron (ZOFRAN) 4 MG tablet Take 1 tablet (4 mg total) by mouth every 8 (eight) hours as needed for nausea or vomiting. 12/04/17 12/04/18  Roxan Hockey, MD  sevelamer carbonate (RENVELA) 800 MG tablet Take 3 tablets (2,400 mg total) by mouth 3 (three) times daily with meals. 01/10/18   Robbie Lis, MD    Family History Family History  Problem Relation Age of Onset  . Stroke Sister     Social History Social History   Tobacco Use  . Smoking status: Never Smoker  . Smokeless tobacco: Never Used  Substance Use Topics  . Alcohol use: Never    Frequency: Never  . Drug use: Never     Allergies   Adhesive [tape]; Hydrocodone-acetaminophen; and Metformin and related   Review of Systems Review of Systems  Constitutional: Negative for fever.  Respiratory: Positive for shortness of breath.   Cardiovascular: Positive  for chest pain.  Gastrointestinal: Positive for abdominal pain, diarrhea, nausea and vomiting. Negative for blood in stool.  Musculoskeletal: Positive for back pain.  All other systems reviewed and are negative.    Physical Exam Updated Vital Signs BP (!) 194/93   Pulse 75   Temp 98 F (36.7 C) (Oral)   Resp 14   Ht 5' 7"  (1.702 m)   Wt 93.9 kg   LMP 10/18/2009 (Within Months) Comment: ablation  SpO2 100%   BMI 32.42 kg/m   Physical Exam  Constitutional: She is oriented to person, place, and time. She appears well-developed and well-nourished.  HENT:  Head: Normocephalic and atraumatic.  Right Ear: External ear normal.  Left Ear: External ear normal.  Nose: Nose normal.  Eyes: Right eye exhibits no  discharge. Left eye exhibits no discharge.  Cardiovascular: Normal rate, regular rhythm and normal heart sounds.  Pulmonary/Chest: Effort normal and breath sounds normal. She has no wheezes. She has no rales. She exhibits tenderness (mild).    Abdominal: Soft. She exhibits no distension. There is no tenderness.  Musculoskeletal:       Thoracic back: She exhibits tenderness.       Lumbar back: She exhibits no tenderness.       Back:  Left fistula intact with thrill. No obvious erythema or tenderness  Neurological: She is alert and oriented to person, place, and time.  Skin: Skin is warm and dry. She is not diaphoretic.  Nursing note and vitals reviewed.    ED Treatments / Results  Labs (all labs ordered are listed, but only abnormal results are displayed) Labs Reviewed  CBC WITH DIFFERENTIAL/PLATELET - Abnormal; Notable for the following components:      Result Value   RBC 3.78 (*)    Hemoglobin 9.1 (*)    HCT 29.6 (*)    MCH 24.1 (*)    RDW 17.4 (*)    All other components within normal limits  COMPREHENSIVE METABOLIC PANEL - Abnormal; Notable for the following components:   Potassium 7.3 (*)    CO2 21 (*)    Glucose, Bld 167 (*)    BUN 113 (*)    Creatinine,  Ser 13.79 (*)    Calcium 7.8 (*)    Albumin 3.2 (*)    AST 10 (*)    GFR calc non Af Amer 3 (*)    GFR calc Af Amer 3 (*)    Anion gap 18 (*)    All other components within normal limits  LIPASE, BLOOD - Abnormal; Notable for the following components:   Lipase 100 (*)    All other components within normal limits  TROPONIN I  GLUCOSE, RANDOM    EKG EKG Interpretation  Date/Time:  Tuesday February 16 2018 12:03:55 EDT Ventricular Rate:  80 PR Interval:    QRS Duration: 98 QT Interval:  390 QTC Calculation: 450 R Axis:   18 Text Interpretation:  Normal sinus rhythm Consider left atrial enlargement Borderline low voltage, extremity leads Borderline repolarization abnormality Artifact otherwise similar to Aug 2019 Confirmed by Sherwood Gambler [phone]) on 02/16/2018 12:21:45 PM   Radiology Dg Chest 2 View  Result Date: 02/16/2018 CLINICAL DATA:  Shortness of breath, vomiting, chest heaviness EXAM: CHEST - 2 VIEW COMPARISON:  01/21/2018 FINDINGS: Cardiomegaly. Mild interstitial prominence throughout the lungs, likely interstitial edema. No confluent opacities or effusions. Overall, edema pattern slightly less than prior study. IMPRESSION: Cardiomegaly with mild interstitial edema, slightly less than prior study. Electronically Signed   By: Rolm Baptise M.D.   On: 02/16/2018 11:48    Procedures .Critical Care Performed by: Sherwood Gambler, MD Authorized by: Sherwood Gambler, MD   Critical care provider statement:    Critical care time (minutes):  30   Critical care time was exclusive of:  Separately billable procedures and treating other patients   Critical care was necessary to treat or prevent imminent or life-threatening deterioration of the following conditions:  Metabolic crisis   Critical care was time spent personally by me on the following activities:  Development of treatment plan with patient or surrogate, discussions with consultants, evaluation of patient's response to  treatment, examination of patient, obtaining history from patient or surrogate, ordering and performing treatments and interventions, ordering and review of laboratory studies, ordering and review of  radiographic studies, pulse oximetry, re-evaluation of patient's condition and review of old charts   (including critical care time)  Medications Ordered in ED Medications  calcium gluconate inj 10% (1 g) URGENT USE ONLY! (has no administration in time range)  insulin aspart (novoLOG) injection 5 Units (has no administration in time range)    And  dextrose 50 % solution 50 mL (has no administration in time range)  dextrose 10 % infusion (has no administration in time range)  Chlorhexidine Gluconate Cloth 2 % PADS 6 each (has no administration in time range)  ondansetron (ZOFRAN) injection 4 mg (4 mg Intravenous Given 02/16/18 1234)  acetaminophen (TYLENOL) tablet 1,000 mg (1,000 mg Oral Given 02/16/18 1246)  albuterol (PROVENTIL) (2.5 MG/3ML) 0.083% nebulizer solution 10 mg (10 mg Nebulization Given 02/16/18 1549)     Initial Impression / Assessment and Plan / ED Course  I have reviewed the triage vital signs and the nursing notes.  Pertinent labs & imaging results that were available during my care of the patient were reviewed by me and considered in my medical decision making (see chart for details).     Patient's initial lab work was canceled by the lab for unknown reasons, probably hemolysis.  Repeat labs show significant hyperkalemia with a K of 7.3.  Fortunately no significant arrhythmias or ECG changes seen.  She will be given IV calcium, insulin, dextrose, and an albuterol treatment.  I discussed with Dr. Jonnie Finner, who will take to dialysis.  Final Clinical Impressions(s) / ED Diagnoses   Final diagnoses:  Hyperkalemia    ED Discharge Orders    None       Sherwood Gambler, MD 02/16/18 1556

## 2018-02-16 NOTE — ED Notes (Signed)
Pt returned to the ED from dialysis, obtained discharge order for Dr. Posey Pronto. Cab voucher provided from case management.

## 2018-02-16 NOTE — ED Notes (Signed)
Report given to [facility] in Dialysis.  She states per instructions, pt will  Be d/c'd home after dialysis.

## 2018-02-17 ENCOUNTER — Encounter (HOSPITAL_COMMUNITY): Payer: Self-pay | Admitting: Emergency Medicine

## 2018-02-17 ENCOUNTER — Emergency Department (HOSPITAL_COMMUNITY)
Admission: EM | Admit: 2018-02-17 | Discharge: 2018-02-17 | Disposition: A | Discharge status: Home / Self Care | Payer: Medicaid Other | Attending: Emergency Medicine | Admitting: Emergency Medicine

## 2018-02-17 DIAGNOSIS — I132 Hypertensive heart and chronic kidney disease with heart failure and with stage 5 chronic kidney disease, or end stage renal disease: Secondary | ICD-10-CM | POA: Insufficient documentation

## 2018-02-17 DIAGNOSIS — Z794 Long term (current) use of insulin: Secondary | ICD-10-CM | POA: Insufficient documentation

## 2018-02-17 DIAGNOSIS — N186 End stage renal disease: Secondary | ICD-10-CM | POA: Diagnosis not present

## 2018-02-17 DIAGNOSIS — Z79899 Other long term (current) drug therapy: Secondary | ICD-10-CM | POA: Diagnosis not present

## 2018-02-17 DIAGNOSIS — Z7902 Long term (current) use of antithrombotics/antiplatelets: Secondary | ICD-10-CM | POA: Insufficient documentation

## 2018-02-17 DIAGNOSIS — Z955 Presence of coronary angioplasty implant and graft: Secondary | ICD-10-CM | POA: Diagnosis not present

## 2018-02-17 DIAGNOSIS — Z7982 Long term (current) use of aspirin: Secondary | ICD-10-CM | POA: Diagnosis not present

## 2018-02-17 DIAGNOSIS — I509 Heart failure, unspecified: Secondary | ICD-10-CM | POA: Diagnosis not present

## 2018-02-17 DIAGNOSIS — Z992 Dependence on renal dialysis: Secondary | ICD-10-CM | POA: Diagnosis not present

## 2018-02-17 DIAGNOSIS — E1122 Type 2 diabetes mellitus with diabetic chronic kidney disease: Secondary | ICD-10-CM | POA: Insufficient documentation

## 2018-02-17 DIAGNOSIS — R51 Headache: Secondary | ICD-10-CM | POA: Diagnosis present

## 2018-02-17 DIAGNOSIS — I959 Hypotension, unspecified: Secondary | ICD-10-CM | POA: Diagnosis not present

## 2018-02-17 DIAGNOSIS — I2510 Atherosclerotic heart disease of native coronary artery without angina pectoris: Secondary | ICD-10-CM | POA: Diagnosis not present

## 2018-02-17 DIAGNOSIS — F329 Major depressive disorder, single episode, unspecified: Secondary | ICD-10-CM | POA: Insufficient documentation

## 2018-02-17 LAB — CBC WITH DIFFERENTIAL/PLATELET
ABS IMMATURE GRANULOCYTES: 0 10*3/uL (ref 0.0–0.1)
Basophils Absolute: 0 10*3/uL (ref 0.0–0.1)
Basophils Relative: 1 %
EOS ABS: 0.1 10*3/uL (ref 0.0–0.7)
Eosinophils Relative: 2 %
HCT: 30.6 % — ABNORMAL LOW (ref 36.0–46.0)
HEMOGLOBIN: 9.1 g/dL — AB (ref 12.0–15.0)
Immature Granulocytes: 0 %
Lymphocytes Relative: 20 %
Lymphs Abs: 1 10*3/uL (ref 0.7–4.0)
MCH: 23.8 pg — AB (ref 26.0–34.0)
MCHC: 29.7 g/dL — ABNORMAL LOW (ref 30.0–36.0)
MCV: 80.1 fL (ref 78.0–100.0)
MONOS PCT: 10 %
Monocytes Absolute: 0.5 10*3/uL (ref 0.1–1.0)
NEUTROS PCT: 67 %
Neutro Abs: 3.2 10*3/uL (ref 1.7–7.7)
Platelets: 251 10*3/uL (ref 150–400)
RBC: 3.82 MIL/uL — ABNORMAL LOW (ref 3.87–5.11)
RDW: 17.6 % — ABNORMAL HIGH (ref 11.5–15.5)
WBC: 4.8 10*3/uL (ref 4.0–10.5)

## 2018-02-17 LAB — TROPONIN I
Troponin I: 0.07 ng/mL (ref <0.03)
Troponin I: 0.07 ng/mL (ref <0.03)

## 2018-02-17 LAB — CBG MONITORING, ED: GLUCOSE-CAPILLARY: 151 mg/dL — AB (ref 70–99)

## 2018-02-17 NOTE — ED Provider Notes (Signed)
[facility] EMERGENCY DEPARTMENT Provider Note   CSN: [id number] Arrival date & time: 02/17/18  1305     History   Chief Complaint Chief Complaint  Patient presents with  . Hypotension  . Headache    HPI Lauren Warner is a 52 y.o. female.  HPI Patient presents with headache and lightheadedness.  Had been seen yesterday and had dialysis for hyperkalemia.  Got home around 2 in the morning states she was up for another couple hours.  Then had a doctor's appointment 10:00 this morning.  States she got there and was dizzy.  States she was dizzy when she got this morning.  Reportedly was hypotensive also.  Feels somewhat better now.  States she has not been on dialysis that long and tends to get lightheaded after it.  Has had no chest pain.  Reportedly had some confusion while her blood pressure was low. Past Medical History:  Diagnosis Date  . Anemia   . Anxiety   . Asthma   . CHF (congestive heart failure) ([place])   . Coronary artery disease   . Daily headache   . Depression   . ESRD (end stage renal disease) on dialysis ([place])    "[facility]; TTS" (10/19/2017)  . High cholesterol   . History of blood transfusion 10/19/2017   "anemia"  . Hyperkalemia 10/2017  . Hypertension   . On home oxygen therapy    "2L; daily" (10/19/2017)  . Pneumonia    "several times" (10/19/2017)  . Pulmonary embolism ([place]) 2012  . Type 1 diabetes mellitus [facility])     Patient Active Problem List   Diagnosis Date Noted  . SOB (shortness of breath) 01/22/2018  . Respiratory failure ([place]) 01/21/2018  . MDD (major depressive disorder), single episode, moderate ([place])   . Acute encephalopathy 01/07/2018  . Suicidal ideation 01/07/2018  . Hypertensive encephalopathy 12/23/2017  . Hypertensive urgency 12/22/2017  . Syncope and collapse 12/03/2017  . Hyperkalemia 11/10/2017  . Pressure injury of skin 10/20/2017  . Essential hypertension 10/19/2017  . CAD (coronary artery disease)  10/19/2017  . ESRD (end stage renal disease) ([place]) 10/19/2017  . Syncope 10/19/2017  . Anemia due to end stage renal disease ([place]) 10/19/2017  . Closed left ankle fracture 10/19/2017  . Nausea vomiting and diarrhea 10/19/2017  . DM (diabetes mellitus), type 2 with renal complications ([place]) 94/12/6806  . Acute respiratory failure with hypoxia ([place]) 10/18/2017    Past Surgical History:  Procedure Laterality Date  . AV FISTULA PLACEMENT Left 2018  . CESAREAN SECTION  1989  . CORONARY ANGIOPLASTY WITH STENT PLACEMENT  ~ 08/2017   "3 stents" (10/19/2017)  . ENDOMETRIAL ABLATION  ~ 2011     OB History   None      Home Medications    Prior to Admission medications   Medication Sig Start Date End Date Taking? Authorizing Provider  albuterol (PROAIR HFA) 108 (90 Base) MCG/ACT inhaler Inhale 2 puffs into the lungs every 6 (six) hours as needed for wheezing or shortness of breath. 12/04/17   Roxan Hockey, MD  amLODipine (NORVASC) 5 MG tablet Take 5 mg by mouth daily. 01/19/18   [provider]  aspirin EC 81 MG tablet Take 1 tablet (81 mg total) by mouth daily. With food 12/04/17   Roxan Hockey, MD  atomoxetine (STRATTERA) 40 MG capsule Take 40 mg by mouth daily.    [provider]  blood glucose meter kit and supplies KIT Dispense based on patient and  insurance preference. Use up to four times daily as directed. (FOR ICD-9 250.00, 250.01). 01/25/18   Bonnielee Haff, MD  calcium acetate (PHOSLO) 667 MG capsule Take 1 capsule (667 mg total) by mouth 3 (three) times daily with meals. 12/04/17   Roxan Hockey, MD  carvedilol (COREG) 6.25 MG tablet Take 1 tablet (6.25 mg total) by mouth 2 (two) times daily with a meal. 12/04/17   Denton Brick, Courage, MD  Chlorhexidine Gluconate Cloth 2 % PADS Apply 6 each topically daily at 6 (six) AM. 01/25/18   Cristal Deer, MD  clopidogrel (PLAVIX) 75 MG tablet Take 1 tablet (75 mg total) by mouth daily. 12/04/17   Roxan Hockey, MD    escitalopram (LEXAPRO) 10 MG tablet Take 1 tablet (10 mg total) by mouth daily. 01/25/18   Bonnielee Haff, MD  gabapentin (NEURONTIN) 300 MG capsule Take 1 capsule (300 mg total) by mouth at bedtime. 01/25/18   Bonnielee Haff, MD  heparin 1000 unit/mL SOLN injection 4 mLs (4,000 Units total) by Dialysis route one time in dialysis for 1 dose. 01/24/18 02/16/18  Cristal Deer, MD  insulin lispro (HUMALOG) 100 UNIT/ML KiwkPen Inject 0.03 mLs (3 Units total) into the skin 3 (three) times daily. 01/25/18   Bonnielee Haff, MD  latanoprost (XALATAN) 0.005 % ophthalmic solution Place 1 drop into both eyes at bedtime. 12/04/17   Roxan Hockey, MD  lidocaine-prilocaine (EMLA) cream Apply 1 application topically as needed. 1 hour before dialysis 02/11/18   [provider]  multivitamin (RENA-VIT) TABS tablet Take 1 tablet by mouth at bedtime. 12/04/17   Roxan Hockey, MD  Nutritional Supplements (FEEDING SUPPLEMENT, NEPRO CARB STEADY,) LIQD Take 237 mLs by mouth 2 (two) times daily between meals. 01/25/18   Cristal Deer, MD  ondansetron (ZOFRAN) 4 MG tablet Take 1 tablet (4 mg total) by mouth every 8 (eight) hours as needed for nausea or vomiting. 12/04/17 12/04/18  Roxan Hockey, MD  sevelamer carbonate (RENVELA) 800 MG tablet Take 3 tablets (2,400 mg total) by mouth 3 (three) times daily with meals. 01/10/18   Robbie Lis, MD    Family History Family History  Problem Relation Age of Onset  . Stroke Sister     Social History Social History   Tobacco Use  . Smoking status: Never Smoker  . Smokeless tobacco: Never Used  Substance Use Topics  . Alcohol use: Never    Frequency: Never  . Drug use: Never     Allergies   Adhesive [tape]; Hydrocodone-acetaminophen; and Metformin and related   Review of Systems Review of Systems  Constitutional: Negative for appetite change.  HENT: Negative for congestion.   Respiratory: Negative for shortness of breath.   Cardiovascular:  Negative for chest pain.  Gastrointestinal: Negative for abdominal pain.  Genitourinary: Negative for flank pain.  Musculoskeletal: Negative for back pain.  Skin: Negative for rash.  Neurological: Positive for light-headedness.  Hematological: Negative for adenopathy.  Psychiatric/Behavioral: Negative for confusion.     Physical Exam Updated Vital Signs BP (!) 129/51   Pulse 78   Temp 98.4 F (36.9 C) (Oral)   Resp (!) 8   Ht 5' 7"  (1.702 m)   Wt 88.5 kg   SpO2 100%   BMI 30.54 kg/m   Physical Exam  Constitutional: She appears well-developed.  HENT:  Head: Atraumatic.  Eyes: Pupils are equal, round, and reactive to light.  Neck: Neck supple.  Cardiovascular: Normal rate.  Abdominal: Soft.  Musculoskeletal: She exhibits no tenderness.  Neurological: She is  alert.  Skin: Skin is warm.     ED Treatments / Results  Labs (all labs ordered are listed, but only abnormal results are displayed) Labs Reviewed  COMPREHENSIVE METABOLIC PANEL - Abnormal; Notable for the following components:      Result Value   Sodium 133 (*)    Potassium 5.6 (*)    Chloride 92 (*)    Glucose, Bld 258 (*)    BUN 38 (*)    Creatinine, Ser 7.63 (*)    Calcium 8.3 (*)    Albumin 3.3 (*)    AST 10 (*)    GFR calc non Af Amer 6 (*)    GFR calc Af Amer 6 (*)    Anion gap 16 (*)    All other components within normal limits  TROPONIN I - Abnormal; Notable for the following components:   Troponin I 0.07 (*)    All other components within normal limits  CBC WITH DIFFERENTIAL/PLATELET - Abnormal; Notable for the following components:   RBC 3.82 (*)    Hemoglobin 9.1 (*)    HCT 30.6 (*)    MCH 23.8 (*)    MCHC 29.7 (*)    RDW 17.6 (*)    All other components within normal limits  TROPONIN I - Abnormal; Notable for the following components:   Troponin I 0.07 (*)    All other components within normal limits    EKG EKG Interpretation  Date/Time:  Wednesday February 17 2018 13:14:21  EDT Ventricular Rate:  85 PR Interval:    QRS Duration: 97 QT Interval:  399 QTC Calculation: 475 R Axis:   33 Text Interpretation:  Sinus rhythm Nonspecific T abnormalities, lateral leads No significant change since last tracing Confirmed by Davonna Belling [phone]) on 02/17/2018 1:18:09 PM   Radiology Dg Chest 2 View  Result Date: 02/16/2018 CLINICAL DATA:  Shortness of breath, vomiting, chest heaviness EXAM: CHEST - 2 VIEW COMPARISON:  01/21/2018 FINDINGS: Cardiomegaly. Mild interstitial prominence throughout the lungs, likely interstitial edema. No confluent opacities or effusions. Overall, edema pattern slightly less than prior study. IMPRESSION: Cardiomegaly with mild interstitial edema, slightly less than prior study. Electronically Signed   By: Rolm Baptise M.D.   On: 02/16/2018 11:48    Procedures Procedures (including critical care time)  Medications Ordered in ED Medications - No data to display   Initial Impression / Assessment and Plan / ED Course  I have reviewed the triage vital signs and the nursing notes.  Pertinent labs & imaging results that were available during my care of the patient were reviewed by me and considered in my medical decision making (see chart for details).     Patient with hypotensive episode.  I think likely related to her dialysis.  Mild hyperkalemia but does not appear to need treatment at this time.  Due for dialysis in the morning.  Initial troponin mildly elevated.  Likely due to the hypotension.  Repeat troponin also is stable.  Patient feels better and will be discharged home to follow-up with dialysis tomorrow.  Final Clinical Impressions(s) / ED Diagnoses   Final diagnoses:  Hypotension, unspecified hypotension type  End stage renal disease on dialysis [facility])    ED Discharge Orders    None       Davonna Belling, MD 02/17/18 [place]

## 2018-02-17 NOTE — Discharge Instructions (Addendum)
Your blood pressure was likely low from the dialysis.  Cardiac enzymes were mildly elevated but stable.  Follow-up for dialysis tomorrow since her potassium is also mildly above normal.

## 2018-02-17 NOTE — ED Triage Notes (Addendum)
Pt here via [facility] from eye dr, staff called due to pt being lethargic, minimal responsiveness with them,, pressure 82/50. A&O x4 with EMS, dialysis pt last txt yesterday and has felt weak since, c/o a h/a and heart palpitations. CBG 338, 88/50, P 88, 100% RA, 18 RR.

## 2018-02-17 NOTE — ED Notes (Signed)
Pt placed on 2L [place] for comfort.

## 2018-02-17 NOTE — ED Notes (Signed)
Pt dressed, IV removed. Pt given taxi voucher. Pt taken to ED lobby to await for cab home.

## 2018-02-17 NOTE — ED Notes (Signed)
Discharge instructions discussed with Pt. Pt verbalized understanding. Pt stable and ambulatory.    

## 2018-02-17 NOTE — ED Notes (Signed)
IV Team arrived at bedside.

## 2018-02-18 LAB — COMPREHENSIVE METABOLIC PANEL
ALT: 12 U/L (ref 0–44)
ANION GAP: 16 — AB (ref 5–15)
AST: 10 U/L — ABNORMAL LOW (ref 15–41)
Albumin: 3.3 g/dL — ABNORMAL LOW (ref 3.5–5.0)
Alkaline Phosphatase: 96 U/L (ref 38–126)
BILIRUBIN TOTAL: 0.3 mg/dL (ref 0.3–1.2)
BUN: 38 mg/dL — ABNORMAL HIGH (ref 6–20)
CHLORIDE: 92 mmol/L — AB (ref 98–111)
CO2: 25 mmol/L (ref 22–32)
Calcium: 8.3 mg/dL — ABNORMAL LOW (ref 8.9–10.3)
Creatinine, Ser: 7.63 mg/dL — ABNORMAL HIGH (ref 0.44–1.00)
GFR, EST AFRICAN AMERICAN: 6 mL/min — AB (ref >60)
GFR, EST NON AFRICAN AMERICAN: 6 mL/min — AB (ref >60)
Glucose, Bld: 258 mg/dL — ABNORMAL HIGH (ref 70–99)
POTASSIUM: 5.6 mmol/L — AB (ref 3.5–5.1)
Sodium: 133 mmol/L — ABNORMAL LOW (ref 135–145)
TOTAL PROTEIN: 7 g/dL (ref 6.5–8.1)

## 2018-03-03 ENCOUNTER — Encounter (HOSPITAL_COMMUNITY): Payer: Self-pay | Admitting: Emergency Medicine

## 2018-03-03 ENCOUNTER — Observation Stay (HOSPITAL_COMMUNITY)
Admission: EM | Admit: 2018-03-03 | Discharge: 2018-03-04 | Disposition: A | Discharge status: Home / Self Care | Payer: Medicaid Other | Attending: Internal Medicine | Admitting: Internal Medicine

## 2018-03-03 ENCOUNTER — Emergency Department (HOSPITAL_COMMUNITY): Payer: Medicaid Other

## 2018-03-03 DIAGNOSIS — Z794 Long term (current) use of insulin: Secondary | ICD-10-CM | POA: Diagnosis not present

## 2018-03-03 DIAGNOSIS — R918 Other nonspecific abnormal finding of lung field: Secondary | ICD-10-CM | POA: Diagnosis not present

## 2018-03-03 DIAGNOSIS — Z915 Personal history of self-harm: Secondary | ICD-10-CM | POA: Diagnosis not present

## 2018-03-03 DIAGNOSIS — Z992 Dependence on renal dialysis: Secondary | ICD-10-CM | POA: Insufficient documentation

## 2018-03-03 DIAGNOSIS — Z888 Allergy status to other drugs, medicaments and biological substances status: Secondary | ICD-10-CM | POA: Insufficient documentation

## 2018-03-03 DIAGNOSIS — J45909 Unspecified asthma, uncomplicated: Secondary | ICD-10-CM | POA: Diagnosis not present

## 2018-03-03 DIAGNOSIS — F329 Major depressive disorder, single episode, unspecified: Secondary | ICD-10-CM | POA: Diagnosis not present

## 2018-03-03 DIAGNOSIS — Z7902 Long term (current) use of antithrombotics/antiplatelets: Secondary | ICD-10-CM | POA: Insufficient documentation

## 2018-03-03 DIAGNOSIS — E1121 Type 2 diabetes mellitus with diabetic nephropathy: Secondary | ICD-10-CM | POA: Diagnosis not present

## 2018-03-03 DIAGNOSIS — Z86711 Personal history of pulmonary embolism: Secondary | ICD-10-CM | POA: Insufficient documentation

## 2018-03-03 DIAGNOSIS — M7989 Other specified soft tissue disorders: Secondary | ICD-10-CM | POA: Insufficient documentation

## 2018-03-03 DIAGNOSIS — E1122 Type 2 diabetes mellitus with diabetic chronic kidney disease: Secondary | ICD-10-CM | POA: Diagnosis not present

## 2018-03-03 DIAGNOSIS — M25572 Pain in left ankle and joints of left foot: Secondary | ICD-10-CM | POA: Diagnosis not present

## 2018-03-03 DIAGNOSIS — N186 End stage renal disease: Secondary | ICD-10-CM | POA: Diagnosis not present

## 2018-03-03 DIAGNOSIS — F419 Anxiety disorder, unspecified: Secondary | ICD-10-CM | POA: Insufficient documentation

## 2018-03-03 DIAGNOSIS — I132 Hypertensive heart and chronic kidney disease with heart failure and with stage 5 chronic kidney disease, or end stage renal disease: Secondary | ICD-10-CM | POA: Diagnosis not present

## 2018-03-03 DIAGNOSIS — E875 Hyperkalemia: Principal | ICD-10-CM | POA: Diagnosis present

## 2018-03-03 DIAGNOSIS — Z79899 Other long term (current) drug therapy: Secondary | ICD-10-CM | POA: Insufficient documentation

## 2018-03-03 DIAGNOSIS — E1129 Type 2 diabetes mellitus with other diabetic kidney complication: Secondary | ICD-10-CM | POA: Diagnosis present

## 2018-03-03 DIAGNOSIS — Z955 Presence of coronary angioplasty implant and graft: Secondary | ICD-10-CM | POA: Insufficient documentation

## 2018-03-03 DIAGNOSIS — I10 Essential (primary) hypertension: Secondary | ICD-10-CM | POA: Diagnosis not present

## 2018-03-03 DIAGNOSIS — D631 Anemia in chronic kidney disease: Secondary | ICD-10-CM | POA: Insufficient documentation

## 2018-03-03 DIAGNOSIS — Z9981 Dependence on supplemental oxygen: Secondary | ICD-10-CM | POA: Insufficient documentation

## 2018-03-03 DIAGNOSIS — Z7982 Long term (current) use of aspirin: Secondary | ICD-10-CM | POA: Diagnosis not present

## 2018-03-03 DIAGNOSIS — F322 Major depressive disorder, single episode, severe without psychotic features: Secondary | ICD-10-CM

## 2018-03-03 DIAGNOSIS — S82892A Other fracture of left lower leg, initial encounter for closed fracture: Secondary | ICD-10-CM | POA: Diagnosis present

## 2018-03-03 DIAGNOSIS — G8929 Other chronic pain: Secondary | ICD-10-CM | POA: Insufficient documentation

## 2018-03-03 DIAGNOSIS — R45851 Suicidal ideations: Secondary | ICD-10-CM

## 2018-03-03 DIAGNOSIS — Z823 Family history of stroke: Secondary | ICD-10-CM | POA: Insufficient documentation

## 2018-03-03 DIAGNOSIS — I2510 Atherosclerotic heart disease of native coronary artery without angina pectoris: Secondary | ICD-10-CM | POA: Diagnosis not present

## 2018-03-03 DIAGNOSIS — I509 Heart failure, unspecified: Secondary | ICD-10-CM | POA: Diagnosis not present

## 2018-03-03 DIAGNOSIS — Z885 Allergy status to narcotic agent status: Secondary | ICD-10-CM | POA: Insufficient documentation

## 2018-03-03 LAB — COMPREHENSIVE METABOLIC PANEL
ALT: 15 U/L (ref 0–44)
AST: 8 U/L — AB (ref 15–41)
Albumin: 3.6 g/dL (ref 3.5–5.0)
Alkaline Phosphatase: 98 U/L (ref 38–126)
Anion gap: 14 (ref 5–15)
BUN: 89 mg/dL — AB (ref 6–20)
CHLORIDE: 99 mmol/L (ref 98–111)
CO2: 27 mmol/L (ref 22–32)
CREATININE: 12.31 mg/dL — AB (ref 0.44–1.00)
Calcium: 7.8 mg/dL — ABNORMAL LOW (ref 8.9–10.3)
GFR calc Af Amer: 4 mL/min — ABNORMAL LOW (ref >60)
GFR, EST NON AFRICAN AMERICAN: 3 mL/min — AB (ref >60)
Glucose, Bld: 227 mg/dL — ABNORMAL HIGH (ref 70–99)
Potassium: 6 mmol/L — ABNORMAL HIGH (ref 3.5–5.1)
SODIUM: 140 mmol/L (ref 135–145)
Total Bilirubin: 0.5 mg/dL (ref 0.3–1.2)
Total Protein: 7.4 g/dL (ref 6.5–8.1)

## 2018-03-03 LAB — CBC
HCT: 26.8 % — ABNORMAL LOW (ref 36.0–46.0)
Hemoglobin: 8.1 g/dL — ABNORMAL LOW (ref 12.0–15.0)
MCH: 23.7 pg — AB (ref 26.0–34.0)
MCHC: 30.2 g/dL (ref 30.0–36.0)
MCV: 78.4 fL (ref 78.0–100.0)
PLATELETS: 262 10*3/uL (ref 150–400)
RBC: 3.42 MIL/uL — AB (ref 3.87–5.11)
RDW: 17.4 % — ABNORMAL HIGH (ref 11.5–15.5)
WBC: 5 10*3/uL (ref 4.0–10.5)

## 2018-03-03 LAB — I-STAT BETA HCG BLOOD, ED (MC, WL, AP ONLY): HCG, QUANTITATIVE: 8.5 m[IU]/mL — AB (ref <5)

## 2018-03-03 LAB — SALICYLATE LEVEL: Salicylate Lvl: <7 mg/dL (ref 2.8–30.0)

## 2018-03-03 LAB — ACETAMINOPHEN LEVEL: Acetaminophen (Tylenol), Serum: <10 ug/mL — ABNORMAL LOW (ref 10–30)

## 2018-03-03 LAB — ETHANOL: Alcohol, Ethyl (B): <10 mg/dL (ref <10)

## 2018-03-03 MED ORDER — ONDANSETRON HCL 4 MG PO TABS: 4.0000 mg | ORAL_TABLET | Freq: Four times a day (QID) | ORAL | Status: DC | PRN

## 2018-03-03 MED ORDER — ESCITALOPRAM OXALATE 10 MG PO TABS
10.0000 mg | ORAL_TABLET | Freq: Every day | ORAL | Status: DC
  Administered 2018-03-04: 10 mg via ORAL
  Filled 2018-03-03: qty 1

## 2018-03-03 MED ORDER — ASPIRIN EC 81 MG PO TBEC
81.0000 mg | DELAYED_RELEASE_TABLET | Freq: Every day | ORAL | Status: DC
  Administered 2018-03-04: 81 mg via ORAL
  Filled 2018-03-03: qty 1

## 2018-03-03 MED ORDER — HEPARIN SODIUM (PORCINE) 5000 UNIT/ML IJ SOLN
5000.0000 [IU] | Freq: Three times a day (TID) | INTRAMUSCULAR | Status: DC
  Administered 2018-03-04: 5000 [IU] via SUBCUTANEOUS
  Filled 2018-03-03: qty 1

## 2018-03-03 MED ORDER — CLOPIDOGREL BISULFATE 75 MG PO TABS
75.0000 mg | ORAL_TABLET | Freq: Every day | ORAL | Status: DC
  Administered 2018-03-04: 75 mg via ORAL
  Filled 2018-03-03: qty 1

## 2018-03-03 MED ORDER — AMLODIPINE BESYLATE 5 MG PO TABS
5.0000 mg | ORAL_TABLET | Freq: Every day | ORAL | Status: DC
  Administered 2018-03-04: 5 mg via ORAL
  Filled 2018-03-03: qty 1

## 2018-03-03 MED ORDER — DOXERCALCIFEROL 4 MCG/2ML IV SOLN
2.0000 ug | INTRAVENOUS | Status: DC
  Administered 2018-03-04: 2 ug via INTRAVENOUS
  Filled 2018-03-03: qty 2

## 2018-03-03 MED ORDER — CARVEDILOL 6.25 MG PO TABS
6.2500 mg | ORAL_TABLET | Freq: Two times a day (BID) | ORAL | Status: DC
  Administered 2018-03-04 (×2): 6.25 mg via ORAL
  Filled 2018-03-03 (×2): qty 1

## 2018-03-03 MED ORDER — INSULIN ASPART 100 UNIT/ML ~~LOC~~ SOLN
0.0000 [IU] | Freq: Three times a day (TID) | SUBCUTANEOUS | Status: DC
  Administered 2018-03-04: 1 [IU] via SUBCUTANEOUS

## 2018-03-03 MED ORDER — ALBUTEROL SULFATE (2.5 MG/3ML) 0.083% IN NEBU
3.0000 mL | INHALATION_SOLUTION | Freq: Four times a day (QID) | RESPIRATORY_TRACT | Status: DC | PRN
  Filled 2018-03-03: qty 20

## 2018-03-03 MED ORDER — SEVELAMER CARBONATE 800 MG PO TABS
2400.0000 mg | ORAL_TABLET | Freq: Three times a day (TID) | ORAL | Status: DC
  Administered 2018-03-04 (×2): 2400 mg via ORAL
  Filled 2018-03-03 (×2): qty 3

## 2018-03-03 MED ORDER — GABAPENTIN 300 MG PO CAPS
300.0000 mg | ORAL_CAPSULE | Freq: Every day | ORAL | Status: DC
  Administered 2018-03-04: 300 mg via ORAL
  Filled 2018-03-03: qty 1

## 2018-03-03 MED ORDER — CALCIUM ACETATE (PHOS BINDER) 667 MG PO CAPS
667.0000 mg | ORAL_CAPSULE | Freq: Three times a day (TID) | ORAL | Status: DC
  Administered 2018-03-04 (×2): 667 mg via ORAL
  Filled 2018-03-03 (×2): qty 1

## 2018-03-03 MED ORDER — ONDANSETRON HCL 4 MG/2ML IJ SOLN: 4.0000 mg | Freq: Four times a day (QID) | INTRAMUSCULAR | Status: DC | PRN

## 2018-03-03 MED ORDER — RENA-VITE PO TABS
1.0000 | ORAL_TABLET | Freq: Every day | ORAL | Status: DC
  Administered 2018-03-04: 1 via ORAL
  Filled 2018-03-03: qty 1

## 2018-03-03 MED ORDER — LATANOPROST 0.005 % OP SOLN
1.0000 [drp] | Freq: Every day | OPHTHALMIC | Status: DC
  Administered 2018-03-04: 1 [drp] via OPHTHALMIC
  Filled 2018-03-03: qty 2.5

## 2018-03-03 MED ORDER — CHLORHEXIDINE GLUCONATE CLOTH 2 % EX PADS: 6.0000 | MEDICATED_PAD | Freq: Every day | CUTANEOUS | Status: DC

## 2018-03-03 NOTE — H&P (Signed)
History and Physical    Lauren Warner JJK:[id number] DOB: 11-Jun-1965 DOA: 03/03/2018  PCP: [name], [name]  Patient coming from: Home.  Chief Complaint: Suicidal thoughts.  HPI: Lauren Warner is a 52 y.o. female with history of ESRD on hemodialysis missed last 2 sessions because of issues with transportation and also with issues at home was brought to the ER because patient has been having some suicidal thoughts with plans to overdose with insulin.  Patient denies any chest pain or shortness of breath has chronic left ankle pain from previous injury.  ED Course: In the ER labs revealed potassium of 6 with no EKG changes.  Chest x-ray shows mild congestion.  On exam patient has left leg placed on brace.  Patient is not in any respiratory distress.  X-rays of the left ankle shows chronic changes.  Patient had similar changes and during previous admission.  On-call nephrologist Dr. Clover Mealy was consulted and patient transferred from dialysis to [facility].  Review of Systems: As per HPI, rest all negative.   Past Medical History:  Diagnosis Date  . Anemia   . Anxiety   . Asthma   . CHF (congestive heart failure) ([place])   . Coronary artery disease   . Daily headache   . Depression   . ESRD (end stage renal disease) on dialysis ([place])    "[facility]; TTS" (10/19/2017)  . High cholesterol   . History of blood transfusion 10/19/2017   "anemia"  . Hyperkalemia 10/2017  . Hypertension   . On home oxygen therapy    "2L; daily" (10/19/2017)  . Pneumonia    "several times" (10/19/2017)  . Pulmonary embolism ([place]) 2012  . Type 1 diabetes mellitus ([place])     Past Surgical History:  Procedure Laterality Date  . AV FISTULA PLACEMENT Left 2018  . CESAREAN SECTION  1989  . CORONARY ANGIOPLASTY WITH STENT PLACEMENT  ~ 08/2017   "3 stents" (10/19/2017)  . ENDOMETRIAL ABLATION  ~ 2011     reports that she has never smoked. She has never used smokeless tobacco. She reports that  she does not drink alcohol or use drugs.  Allergies  Allergen Reactions  . Adhesive [Tape] Other (See Comments)    "RIPS MY SKIN," SO PLEASE USE COBAN WRAP!!  . Hydrocodone-Acetaminophen Other (See Comments)    "Makes me feel crazy"  . Metformin And Related Other (See Comments)    "Makes me feel crazy"    Family History  Problem Relation Age of Onset  . Stroke Sister     Prior to Admission medications   Medication Sig Start Date End Date Taking? Authorizing Provider  albuterol (PROAIR HFA) 108 (90 Base) MCG/ACT inhaler Inhale 2 puffs into the lungs every 6 (six) hours as needed for wheezing or shortness of breath. 12/04/17  Yes Emokpae, Courage, MD  amLODipine (NORVASC) 5 MG tablet Take 5 mg by mouth daily. 01/19/18  Yes [provider]  aspirin EC 81 MG tablet Take 1 tablet (81 mg total) by mouth daily. With food 12/04/17  Yes Emokpae, Courage, MD  atomoxetine (STRATTERA) 40 MG capsule Take 40 mg by mouth daily.   Yes [provider]  calcium acetate (PHOSLO) 667 MG capsule Take 1 capsule (667 mg total) by mouth 3 (three) times daily with meals. 12/04/17  Yes Roxan Hockey, MD  carvedilol (COREG) 6.25 MG tablet Take 1 tablet (6.25 mg total) by mouth 2 (two) times daily with a meal. 12/04/17  Yes Roxan Hockey, MD  Chlorhexidine Gluconate Cloth 2 % PADS Apply 6 each topically daily at 6 (six) AM. 01/25/18  Yes Cristal Deer, MD  clopidogrel (PLAVIX) 75 MG tablet Take 1 tablet (75 mg total) by mouth daily. 12/04/17  Yes Emokpae, Courage, MD  escitalopram (LEXAPRO) 10 MG tablet Take 1 tablet (10 mg total) by mouth daily. 01/25/18  Yes Bonnielee Haff, MD  gabapentin (NEURONTIN) 300 MG capsule Take 1 capsule (300 mg total) by mouth at bedtime. 01/25/18  Yes Bonnielee Haff, MD  heparin 1000 unit/mL SOLN injection 4 mLs (4,000 Units total) by Dialysis route one time in dialysis for 1 dose. 01/24/18 03/03/18 Yes Cristal Deer, MD  insulin lispro (HUMALOG) 100 UNIT/ML KiwkPen  Inject 0.03 mLs (3 Units total) into the skin 3 (three) times daily. 01/25/18  Yes Bonnielee Haff, MD  latanoprost (XALATAN) 0.005 % ophthalmic solution Place 1 drop into both eyes at bedtime. 12/04/17  Yes Emokpae, Courage, MD  lidocaine-prilocaine (EMLA) cream Apply 1 application topically as needed. 1 hour before dialysis 02/11/18  Yes [provider]  multivitamin (RENA-VIT) TABS tablet Take 1 tablet by mouth at bedtime. 12/04/17  Yes Emokpae, Courage, MD  ondansetron (ZOFRAN) 4 MG tablet Take 1 tablet (4 mg total) by mouth every 8 (eight) hours as needed for nausea or vomiting. 12/04/17 12/04/18 Yes Emokpae, Courage, MD  sevelamer carbonate (RENVELA) 800 MG tablet Take 3 tablets (2,400 mg total) by mouth 3 (three) times daily with meals. 01/10/18  Yes Robbie Lis, MD  blood glucose meter kit and supplies KIT Dispense based on patient and insurance preference. Use up to four times daily as directed. (FOR ICD-9 250.00, 250.01). 01/25/18   Bonnielee Haff, MD  Nutritional Supplements (FEEDING SUPPLEMENT, NEPRO CARB STEADY,) LIQD Take 237 mLs by mouth 2 (two) times daily between meals. Patient not taking: Reported on 03/03/2018 01/25/18   Cristal Deer, MD    Physical Exam: Vitals:   03/03/18 1921 03/03/18 1959 03/03/18 2009 03/03/18 2151  BP: (!) 184/88  (!) 184/88 (!) 154/81  Pulse: 83 69 81 70  Resp: 13  18 (!) 22  Temp: 98.1 F (36.7 C)     TempSrc: Oral     SpO2:   100% 95%      Constitutional: Moderately built and nourished. Vitals:   03/03/18 1921 03/03/18 1959 03/03/18 2009 03/03/18 2151  BP: (!) 184/88  (!) 184/88 (!) 154/81  Pulse: 83 69 81 70  Resp: 13  18 (!) 22  Temp: 98.1 F (36.7 C)     TempSrc: Oral     SpO2:   100% 95%   Eyes: Anicteric no pallor. ENMT: No discharge from the ears eyes nose or mouth. Neck: No mass or.  No neck rigidity. Respiratory: No rhonchi or crepitations. Cardiovascular: S1-S2 heard no murmurs appreciated. Abdomen: Soft nontender bowel  sounds present. Musculoskeletal: Left ankle in brace.  Skin: No rash. Neurologic: Mildly somnolent but arousable and answers questions appropriately moves all extremities. Psychiatric: Suicidal.   Labs on Admission: I have personally reviewed following labs and imaging studies  CBC: Recent Labs  Lab 03/03/18 1545  WBC 5.0  HGB 8.1*  HCT 26.8*  MCV 78.4  PLT 384   Basic Metabolic Panel: Recent Labs  Lab 03/03/18 1545  NA 140  K 6.0*  CL 99  CO2 27  GLUCOSE 227*  BUN 89*  CREATININE 12.31*  CALCIUM 7.8*   GFR: CrCl cannot be calculated (Unknown ideal weight.). Liver Function Tests: Recent Labs  Lab 03/03/18 1545  AST 8*  ALT 15  ALKPHOS 98  BILITOT 0.5  PROT 7.4  ALBUMIN 3.6   No results for input(s): LIPASE, AMYLASE in the last 168 hours. No results for input(s): AMMONIA in the last 168 hours. Coagulation Profile: No results for input(s): INR, PROTIME in the last 168 hours. Cardiac Enzymes: No results for input(s): CKTOTAL, CKMB, CKMBINDEX, TROPONINI in the last 168 hours. BNP (last 3 results) No results for input(s): PROBNP in the last 8760 hours. HbA1C: No results for input(s): HGBA1C in the last 72 hours. CBG: No results for input(s): GLUCAP in the last 168 hours. Lipid Profile: No results for input(s): CHOL, HDL, LDLCALC, TRIG, CHOLHDL, LDLDIRECT in the last 72 hours. Thyroid Function Tests: No results for input(s): TSH, T4TOTAL, FREET4, T3FREE, THYROIDAB in the last 72 hours. Anemia Panel: No results for input(s): VITAMINB12, FOLATE, FERRITIN, TIBC, IRON, RETICCTPCT in the last 72 hours. Urine analysis:    Component Value Date/Time   COLORURINE YELLOW 01/08/2018 1132   APPEARANCEUR HAZY (A) 01/08/2018 1132   LABSPEC 1.011 01/08/2018 1132   PHURINE 8.0 01/08/2018 1132   GLUCOSEU >=500 (A) 01/08/2018 1132   HGBUR SMALL (A) 01/08/2018 1132   BILIRUBINUR NEGATIVE 01/08/2018 1132   KETONESUR NEGATIVE 01/08/2018 1132   PROTEINUR >=300 (A)  01/08/2018 1132   NITRITE NEGATIVE 01/08/2018 1132   LEUKOCYTESUR NEGATIVE 01/08/2018 1132   Sepsis Labs: @LABRCNTIP (procalcitonin:4,lacticidven:4) )No results found for this or any previous visit (from the past 240 hour(s)).   Radiological Exams on Admission: Dg Chest 2 View  Result Date: 03/03/2018 CLINICAL DATA:  Suicidal thoughts and depression. EXAM: CHEST - 2 VIEW COMPARISON:  None. FINDINGS: Cardiomegaly. The hila and mediastinum are unremarkable. No pneumothorax. No nodules or masses. No focal infiltrates. Mild interstitial prominence on the right is likely due to patient rotation. Mild asymmetric pulmonary venous congestion not excluded. No other acute abnormalities. IMPRESSION: 1. Mild increased interstitial opacities on the right are probably due to patient rotation. Mild asymmetric pulmonary venous congestion possible. No other acute abnormalities. Electronically Signed   By: Dorise Bullion III M.D   On: 03/03/2018 18:21   Dg Ankle Complete Left  Result Date: 03/03/2018 CLINICAL DATA:  Pain EXAM: LEFT ANKLE COMPLETE - 3+ VIEW COMPARISON:  None. FINDINGS: Significant soft tissue swelling anterior to the ankle and hindfoot. The talus and adjacent navicular bone are abnormal with fragmentation. There also appears to be fragmentation through the anterior distal talus. Vascular calcifications. A calcification adjacent to the distal fibula likely represents sequela of an avulsion injury. No other acute abnormalities. IMPRESSION: 1. The talus is abnormal in appearance with deformation. There also appears to be an abnormality in the adjacent distal tibia on the lateral view and the adjacent navicular bone. I suspect the findings are sequela of trauma, age indeterminate. There is significant overlying soft tissue swelling. Electronically Signed   By: Dorise Bullion III M.D   On: 03/03/2018 18:30   Dg Foot Complete Left  Result Date: 03/03/2018 CLINICAL DATA:  Left foot pain and swelling.  EXAM: LEFT FOOT - COMPLETE 3+ VIEW COMPARISON:  None. FINDINGS: Vascular calcifications. Soft tissue swelling in the ankle and hindfoot. No fractures in the phalanges or metatarsals. The talus and navicular bones are abnormal in appearance with mottled lucencies and apparent fragmentation based on the lateral view. No other acute abnormalities. IMPRESSION: 1. The talus and navicular bones are abnormal with apparent fragmentation on the lateral view and a somewhat mottled appearance on the AP and oblique views. There  is focal soft tissue swelling in this region as well. The findings could represent sequela of age-indeterminate trauma. The focal swelling in this region suggests an acute component to the process. Recommend clinical correlation. This could be better evaluated with CT or MRI. Electronically Signed   By: Dorise Bullion III M.D   On: 03/03/2018 18:27    EKG: Independently reviewed.  Normal sinus rhythm.  Assessment/Plan Principal Problem:   Hyperkalemia Active Problems:   Essential hypertension   CAD (coronary artery disease)   ESRD (end stage renal disease) (HCC)   DM (diabetes mellitus), type 2 with renal complications ([place])   Suicidal ideation    1. Hyperkalemia due to missed dialysis with mild fluid overload in a patient with ESRD -on call nephrologist has been consulted and dialysis orders placed.  Patient will be transferred to [facility] for dialysis.  Follow intake output metabolic panel. 2. Depression with suicidal thoughts -been placed on suicide precautions.  Consult psychiatry. 3. Hypertension on amlodipine Coreg. 4. History of CAD on Coreg and statins and aspirin. 5. Anemia likely from renal disease follow CBC. 6. Diabetes mellitus type 2 we will keep patient on sliding scale coverage.   DVT prophylaxis: Heparin. Code Status: Full code. Family Communication: No patient at the bedside. Disposition Plan to be determined. Consults called: Nephrology. Admission  status: Observation.   Rise Patience MD [facility] Pager [phone].  If 7PM-7AM, please contact night-coverage [email or web address] Password [email or web address]  03/03/2018, 11:21 PM

## 2018-03-03 NOTE — ED Notes (Signed)
Ordered a Actuary. No sitter at this time, door open, cords secured, patient's room is within visualization of Engineer, civil (consulting). Patient calm and cooperative.

## 2018-03-03 NOTE — ED Notes (Signed)
Bed: [facility] Expected date:  Expected time:  Means of arrival:  Comments: 

## 2018-03-03 NOTE — Care Management Note (Signed)
Writer spoke to TTS at [facility] and they are reviewing the patient for possible placement.     Writer informed TTS Recruitment consultant) in [place]
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
## 2018-03-03 NOTE — ED Notes (Signed)
Pt is very sad.  She is pleasant and cooperative.  Pt states she just wants to give up.  Pt contracts for safety while here.  Pt was encouraged to provide urine sample.  Sitter at doorway.
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
## 2018-03-03 NOTE — ED Notes (Signed)
Patient belongings placed in 23-25 cabinets. White purse back pack and 1 patient belonging bag.
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
## 2018-03-03 NOTE — ED Provider Notes (Signed)
[facility] DEPT Provider Note   CSN: [id number] Arrival date & time: 03/03/18  1505     History   Chief Complaint Chief Complaint  Patient presents with  . Suicidal    HPI Lauren Warner is a 52 y.o. female with history of ESRD on dialysis Tuesday Thursday Saturday, depression, anxiety, CHF, CAD, diabetes mellitus presents for evaluation of ongoing and worsening suicidal ideation.  She states that she has been having "very dark thoughts ".  Endorses plan to overdose on insulin.  Has a history of suicide attempt in the past.  Recently lost her daughter a few months ago and has been having issues with her housing and transportation to dialysis.  Was unable to attend dialysis yesterday due to a leak in the apartment above her.  Did have a full treatment on Saturday.  Notes chronic pain to the left foot and ankle but has an appointment to see a podiatrist on Friday.  Denies homicidal ideation or auditory or visual hallucinations.  Denies any other complaints at this time.  The history is provided by the patient.    Past Medical History:  Diagnosis Date  . Anemia   . Anxiety   . Asthma   . CHF (congestive heart failure) ([place])   . Coronary artery disease   . Daily headache   . Depression   . ESRD (end stage renal disease) on dialysis ([place])    "[facility]; TTS" (10/19/2017)  . High cholesterol   . History of blood transfusion 10/19/2017   "anemia"  . Hyperkalemia 10/2017  . Hypertension   . On home oxygen therapy    "2L; daily" (10/19/2017)  . Pneumonia    "several times" (10/19/2017)  . Pulmonary embolism ([place]) 2012  . Type 1 diabetes mellitus [facility])     Patient Active Problem List   Diagnosis Date Noted  . SOB (shortness of breath) 01/22/2018  . Respiratory failure ([place]) 01/21/2018  . MDD (major depressive disorder), single episode, moderate ([place])   . Acute encephalopathy 01/07/2018  . Suicidal ideation 01/07/2018  . Hypertensive encephalopathy  12/23/2017  . Hypertensive urgency 12/22/2017  . Syncope and collapse 12/03/2017  . Hyperkalemia 11/10/2017  . Pressure injury of skin 10/20/2017  . Essential hypertension 10/19/2017  . CAD (coronary artery disease) 10/19/2017  . ESRD (end stage renal disease) ([place]) 10/19/2017  . Syncope 10/19/2017  . Anemia due to end stage renal disease ([place]) 10/19/2017  . Closed left ankle fracture 10/19/2017  . Nausea vomiting and diarrhea 10/19/2017  . DM (diabetes mellitus), type 2 with renal complications ([place]) 34/74/2595  . Acute respiratory failure with hypoxia ([place]) 10/18/2017    Past Surgical History:  Procedure Laterality Date  . AV FISTULA PLACEMENT Left 2018  . CESAREAN SECTION  1989  . CORONARY ANGIOPLASTY WITH STENT PLACEMENT  ~ 08/2017   "3 stents" (10/19/2017)  . ENDOMETRIAL ABLATION  ~ 2011     OB History   None      Home Medications    Prior to Admission medications   Medication Sig Start Date End Date Taking? Authorizing Provider  albuterol (PROAIR HFA) 108 (90 Base) MCG/ACT inhaler Inhale 2 puffs into the lungs every 6 (six) hours as needed for wheezing or shortness of breath. 12/04/17  Yes Emokpae, Courage, MD  amLODipine (NORVASC) 5 MG tablet Take 5 mg by mouth daily. 01/19/18  Yes [provider]  aspirin EC 81 MG tablet Take 1 tablet (81 mg total) by mouth daily. With food  12/04/17  Yes Emokpae, Courage, MD  atomoxetine (STRATTERA) 40 MG capsule Take 40 mg by mouth daily.   Yes [provider]  calcium acetate (PHOSLO) 667 MG capsule Take 1 capsule (667 mg total) by mouth 3 (three) times daily with meals. 12/04/17  Yes Roxan Hockey, MD  carvedilol (COREG) 6.25 MG tablet Take 1 tablet (6.25 mg total) by mouth 2 (two) times daily with a meal. 12/04/17  Yes Emokpae, Courage, MD  Chlorhexidine Gluconate Cloth 2 % PADS Apply 6 each topically daily at 6 (six) AM. 01/25/18  Yes Cristal Deer, MD  clopidogrel (PLAVIX) 75 MG tablet Take 1 tablet (75 mg total) by  mouth daily. 12/04/17  Yes Emokpae, Courage, MD  escitalopram (LEXAPRO) 10 MG tablet Take 1 tablet (10 mg total) by mouth daily. 01/25/18  Yes Bonnielee Haff, MD  gabapentin (NEURONTIN) 300 MG capsule Take 1 capsule (300 mg total) by mouth at bedtime. 01/25/18  Yes Bonnielee Haff, MD  heparin 1000 unit/mL SOLN injection 4 mLs (4,000 Units total) by Dialysis route one time in dialysis for 1 dose. 01/24/18 03/03/18 Yes Cristal Deer, MD  insulin lispro (HUMALOG) 100 UNIT/ML KiwkPen Inject 0.03 mLs (3 Units total) into the skin 3 (three) times daily. 01/25/18  Yes Bonnielee Haff, MD  latanoprost (XALATAN) 0.005 % ophthalmic solution Place 1 drop into both eyes at bedtime. 12/04/17  Yes Emokpae, Courage, MD  lidocaine-prilocaine (EMLA) cream Apply 1 application topically as needed. 1 hour before dialysis 02/11/18  Yes [provider]  multivitamin (RENA-VIT) TABS tablet Take 1 tablet by mouth at bedtime. 12/04/17  Yes Emokpae, Courage, MD  ondansetron (ZOFRAN) 4 MG tablet Take 1 tablet (4 mg total) by mouth every 8 (eight) hours as needed for nausea or vomiting. 12/04/17 12/04/18 Yes Emokpae, Courage, MD  sevelamer carbonate (RENVELA) 800 MG tablet Take 3 tablets (2,400 mg total) by mouth 3 (three) times daily with meals. 01/10/18  Yes Robbie Lis, MD  blood glucose meter kit and supplies KIT Dispense based on patient and insurance preference. Use up to four times daily as directed. (FOR ICD-9 250.00, 250.01). 01/25/18   Bonnielee Haff, MD  Nutritional Supplements (FEEDING SUPPLEMENT, NEPRO CARB STEADY,) LIQD Take 237 mLs by mouth 2 (two) times daily between meals. Patient not taking: Reported on 03/03/2018 01/25/18   Cristal Deer, MD    Family History Family History  Problem Relation Age of Onset  . Stroke Sister     Social History Social History   Tobacco Use  . Smoking status: Never Smoker  . Smokeless tobacco: Never Used  Substance Use Topics  . Alcohol use: Never    Frequency: Never    . Drug use: Never     Allergies   Adhesive [tape]; Hydrocodone-acetaminophen; and Metformin and related   Review of Systems Review of Systems  Musculoskeletal: Positive for arthralgias (chronic, unchanged) and joint swelling (chronic, unchanged).  Psychiatric/Behavioral: Positive for sleep disturbance and suicidal ideas. Negative for hallucinations. The patient is nervous/anxious.   All other systems reviewed and are negative.    Physical Exam Updated Vital Signs BP (!) 144/79 (BP Location: Right Arm)   Pulse 67   Temp 98.1 F (36.7 C) (Oral)   Resp 16   SpO2 99%   Physical Exam  Constitutional: She appears well-developed and well-nourished. No distress.  HENT:  Head: Normocephalic and atraumatic.  Eyes: Conjunctivae are normal. Right eye exhibits no discharge. Left eye exhibits no discharge.  Neck: No JVD present. No tracheal deviation present.  Cardiovascular: Normal rate, regular rhythm and intact distal pulses.  Murmur heard. 2+ DP/PT pulses bilaterally  Pulmonary/Chest: Effort normal.  Abdominal: She exhibits no distension.  Musculoskeletal: She exhibits no edema.  Swelling of the left ankle.  Patient states this is chronic and unchanged since since injury several months ago.  Decreased range of motion, also chronic and unchanged.  5/5 strength of BLE major muscle groups  Neurological: She is alert.  Fluent speech, no facial droop, sensation intact to soft touch of extremities.  Skin: Skin is warm and dry. No erythema.  Psychiatric: Her speech is normal. She is withdrawn. She is not actively hallucinating. She exhibits a depressed mood. She expresses suicidal ideation. She expresses no homicidal ideation. She expresses suicidal plans. She expresses no homicidal plans.  Does not appear to be responding to internal stimuli at this time peer  Nursing note and vitals reviewed.    ED Treatments / Results  Labs (all labs ordered are listed, but only abnormal results  are displayed) Labs Reviewed  COMPREHENSIVE METABOLIC PANEL - Abnormal; Notable for the following components:      Result Value   Potassium 6.0 (*)    Glucose, Bld 227 (*)    BUN 89 (*)    Creatinine, Ser 12.31 (*)    Calcium 7.8 (*)    AST 8 (*)    GFR calc non Af Amer 3 (*)    GFR calc Af Amer 4 (*)    All other components within normal limits  ACETAMINOPHEN LEVEL - Abnormal; Notable for the following components:   Acetaminophen (Tylenol), Serum <10 (*)    All other components within normal limits  CBC - Abnormal; Notable for the following components:   RBC 3.42 (*)    Hemoglobin 8.1 (*)    HCT 26.8 (*)    MCH 23.7 (*)    RDW 17.4 (*)    All other components within normal limits  I-STAT BETA HCG BLOOD, ED ([facility], [facility], [facility] ONLY) - Abnormal; Notable for the following components:   I-stat hCG, quantitative 8.5 (*)    All other components within normal limits  ETHANOL  SALICYLATE LEVEL  RAPID URINE DRUG SCREEN, HOSP PERFORMED  BASIC METABOLIC PANEL  CBC  CBC  CREATININE, SERUM  POC URINE PREG, ED    EKG EKG Interpretation  Date/Time:  Wednesday March 03 2018 18:20:04 EDT Ventricular Rate:  81 PR Interval:    QRS Duration: 97 QT Interval:  405 QTC Calculation: 471 R Axis:   52 Text Interpretation:  Sinus rhythm No significant change since last tracing Confirmed by Orlie Dakin [phone]) on 03/03/2018 6:23:53 PM   Radiology Dg Chest 2 View  Result Date: 03/03/2018 CLINICAL DATA:  Suicidal thoughts and depression. EXAM: CHEST - 2 VIEW COMPARISON:  None. FINDINGS: Cardiomegaly. The hila and mediastinum are unremarkable. No pneumothorax. No nodules or masses. No focal infiltrates. Mild interstitial prominence on the right is likely due to patient rotation. Mild asymmetric pulmonary venous congestion not excluded. No other acute abnormalities. IMPRESSION: 1. Mild increased interstitial opacities on the right are probably due to patient rotation. Mild asymmetric pulmonary  venous congestion possible. No other acute abnormalities. Electronically Signed   By: Dorise Bullion III M.D   On: 03/03/2018 18:21   Dg Ankle Complete Left  Result Date: 03/03/2018 CLINICAL DATA:  Pain EXAM: LEFT ANKLE COMPLETE - 3+ VIEW COMPARISON:  None. FINDINGS: Significant soft tissue swelling anterior to the ankle and hindfoot. The talus and adjacent navicular bone  are abnormal with fragmentation. There also appears to be fragmentation through the anterior distal talus. Vascular calcifications. A calcification adjacent to the distal fibula likely represents sequela of an avulsion injury. No other acute abnormalities. IMPRESSION: 1. The talus is abnormal in appearance with deformation. There also appears to be an abnormality in the adjacent distal tibia on the lateral view and the adjacent navicular bone. I suspect the findings are sequela of trauma, age indeterminate. There is significant overlying soft tissue swelling. Electronically Signed   By: Dorise Bullion III M.D   On: 03/03/2018 18:30   Dg Foot Complete Left  Result Date: 03/03/2018 CLINICAL DATA:  Left foot pain and swelling. EXAM: LEFT FOOT - COMPLETE 3+ VIEW COMPARISON:  None. FINDINGS: Vascular calcifications. Soft tissue swelling in the ankle and hindfoot. No fractures in the phalanges or metatarsals. The talus and navicular bones are abnormal in appearance with mottled lucencies and apparent fragmentation based on the lateral view. No other acute abnormalities. IMPRESSION: 1. The talus and navicular bones are abnormal with apparent fragmentation on the lateral view and a somewhat mottled appearance on the AP and oblique views. There is focal soft tissue swelling in this region as well. The findings could represent sequela of age-indeterminate trauma. The focal swelling in this region suggests an acute component to the process. Recommend clinical correlation. This could be better evaluated with CT or MRI. Electronically Signed   By:  Dorise Bullion III M.D   On: 03/03/2018 18:27    Procedures Procedures (including critical care time)  Medications Ordered in ED Medications  Chlorhexidine Gluconate Cloth 2 % PADS 6 each (has no administration in time range)  doxercalciferol (HECTOROL) injection 2 mcg (has no administration in time range)  aspirin EC tablet 81 mg (has no administration in time range)  amLODipine (NORVASC) tablet 5 mg (has no administration in time range)  carvedilol (COREG) tablet 6.25 mg (has no administration in time range)  escitalopram (LEXAPRO) tablet 10 mg (has no administration in time range)  calcium acetate (PHOSLO) capsule 667 mg (has no administration in time range)  sevelamer carbonate (RENVELA) tablet 2,400 mg (has no administration in time range)  clopidogrel (PLAVIX) tablet 75 mg (has no administration in time range)  gabapentin (NEURONTIN) capsule 300 mg (has no administration in time range)  multivitamin (RENA-VIT) tablet 1 tablet (has no administration in time range)  albuterol (PROVENTIL HFA;VENTOLIN HFA) 108 (90 Base) MCG/ACT inhaler 2 puff (has no administration in time range)  latanoprost (XALATAN) 0.005 % ophthalmic solution 1 drop (has no administration in time range)  ondansetron (ZOFRAN) tablet 4 mg (has no administration in time range)    Or  ondansetron (ZOFRAN) injection 4 mg (has no administration in time range)  insulin aspart (novoLOG) injection 0-9 Units (has no administration in time range)  heparin injection 5,000 Units (has no administration in time range)     Initial Impression / Assessment and Plan / ED Course  I have reviewed the triage vital signs and the nursing notes.  Pertinent labs & imaging results that were available during my care of the patient were reviewed by me and considered in my medical decision making (see chart for details).     Patient presents for evaluation of suicidal ideation.  She is afebrile, hypertensive in the ED.  Has not attended  dialysis since last Thursday due to issues with her apartment.  Multiple stressors and recently lost her daughter.  Also notes chronic left ankle and foot pain and swelling  secondary to injury.  She is neurovascularly intact.  Radiographs show abnormal appearing talus and abnormality in the adjacent distal tibial.  No evidence of DVT.  Compartments are soft.  She was given a cam walker and instructed to follow-up with orthopedics on an outpatient basis for further evaluation.  She has an appointment with podiatry in the near future.  Lab work reviewed by me shows potassium of 6.0, creatinine 12.31 and BUN 89.  No significant EKG changes.  Chest x-ray does show mild pulmonary vascular congestion.  She notes mild shortness of breath but no hypoxia or increased work of breathing in the ED today.  Spoke with Dr. Moshe Cipro with nephrology who recommends dialysis first thing tomorrow morning.  She does meet inpatient criteria for admission into the psychiatric hospital and will be placed after dialysis tomorrow morning.  Spoke with Dr. Hal Hope with [facility] hospitalist service who agrees to assume care of patient and bring her into the hospital.  Plan to transfer to [facility] for dialysis tomorrow morning.  Awaiting placement for management of her depression and suicidal ideation.  Final Clinical Impressions(s) / ED Diagnoses   Final diagnoses:  Suicidal ideation  Hyperkalemia  Closed fracture of left ankle, initial encounter    ED Discharge Orders    None       Renita Papa, PA-C 03/04/18 0028    Orlie Dakin, MD 03/04/18 1451

## 2018-03-04 ENCOUNTER — Other Ambulatory Visit: Payer: Self-pay

## 2018-03-04 ENCOUNTER — Inpatient Hospital Stay
Admission: AD | Admit: 2018-03-04 | Discharge: 2018-03-08 | DRG: 885 | Disposition: A | Discharge status: Home / Self Care | Payer: Medicaid Other | Source: Intra-hospital | Attending: Psychiatry | Admitting: Psychiatry

## 2018-03-04 ENCOUNTER — Encounter (HOSPITAL_COMMUNITY): Payer: Self-pay | Admitting: *Deleted

## 2018-03-04 ENCOUNTER — Encounter: Payer: Self-pay | Admitting: Psychiatry

## 2018-03-04 DIAGNOSIS — Z9981 Dependence on supplemental oxygen: Secondary | ICD-10-CM

## 2018-03-04 DIAGNOSIS — Z823 Family history of stroke: Secondary | ICD-10-CM | POA: Diagnosis not present

## 2018-03-04 DIAGNOSIS — Z79899 Other long term (current) drug therapy: Secondary | ICD-10-CM | POA: Diagnosis not present

## 2018-03-04 DIAGNOSIS — E875 Hyperkalemia: Secondary | ICD-10-CM | POA: Diagnosis present

## 2018-03-04 DIAGNOSIS — Z91410 Personal history of adult physical and sexual abuse: Secondary | ICD-10-CM | POA: Diagnosis not present

## 2018-03-04 DIAGNOSIS — Z7982 Long term (current) use of aspirin: Secondary | ICD-10-CM | POA: Diagnosis not present

## 2018-03-04 DIAGNOSIS — D631 Anemia in chronic kidney disease: Secondary | ICD-10-CM | POA: Diagnosis not present

## 2018-03-04 DIAGNOSIS — N186 End stage renal disease: Secondary | ICD-10-CM | POA: Diagnosis present

## 2018-03-04 DIAGNOSIS — R918 Other nonspecific abnormal finding of lung field: Secondary | ICD-10-CM | POA: Diagnosis not present

## 2018-03-04 DIAGNOSIS — M25572 Pain in left ankle and joints of left foot: Secondary | ICD-10-CM | POA: Diagnosis not present

## 2018-03-04 DIAGNOSIS — Z885 Allergy status to narcotic agent status: Secondary | ICD-10-CM

## 2018-03-04 DIAGNOSIS — S92902D Unspecified fracture of left foot, subsequent encounter for fracture with routine healing: Secondary | ICD-10-CM | POA: Diagnosis not present

## 2018-03-04 DIAGNOSIS — E7800 Pure hypercholesterolemia, unspecified: Secondary | ICD-10-CM | POA: Diagnosis present

## 2018-03-04 DIAGNOSIS — I10 Essential (primary) hypertension: Secondary | ICD-10-CM | POA: Diagnosis present

## 2018-03-04 DIAGNOSIS — I2510 Atherosclerotic heart disease of native coronary artery without angina pectoris: Secondary | ICD-10-CM | POA: Diagnosis present

## 2018-03-04 DIAGNOSIS — M7989 Other specified soft tissue disorders: Secondary | ICD-10-CM | POA: Diagnosis not present

## 2018-03-04 DIAGNOSIS — F332 Major depressive disorder, recurrent severe without psychotic features: Secondary | ICD-10-CM | POA: Diagnosis present

## 2018-03-04 DIAGNOSIS — Z8701 Personal history of pneumonia (recurrent): Secondary | ICD-10-CM

## 2018-03-04 DIAGNOSIS — Z992 Dependence on renal dialysis: Secondary | ICD-10-CM | POA: Diagnosis not present

## 2018-03-04 DIAGNOSIS — Z91048 Other nonmedicinal substance allergy status: Secondary | ICD-10-CM

## 2018-03-04 DIAGNOSIS — F419 Anxiety disorder, unspecified: Secondary | ICD-10-CM | POA: Diagnosis present

## 2018-03-04 DIAGNOSIS — E1140 Type 2 diabetes mellitus with diabetic neuropathy, unspecified: Secondary | ICD-10-CM | POA: Diagnosis present

## 2018-03-04 DIAGNOSIS — Z7902 Long term (current) use of antithrombotics/antiplatelets: Secondary | ICD-10-CM | POA: Diagnosis not present

## 2018-03-04 DIAGNOSIS — F322 Major depressive disorder, single episode, severe without psychotic features: Secondary | ICD-10-CM

## 2018-03-04 DIAGNOSIS — Z955 Presence of coronary angioplasty implant and graft: Secondary | ICD-10-CM

## 2018-03-04 DIAGNOSIS — R45851 Suicidal ideations: Secondary | ICD-10-CM

## 2018-03-04 DIAGNOSIS — I509 Heart failure, unspecified: Secondary | ICD-10-CM | POA: Diagnosis present

## 2018-03-04 DIAGNOSIS — Z915 Personal history of self-harm: Secondary | ICD-10-CM | POA: Diagnosis not present

## 2018-03-04 DIAGNOSIS — W19XXXD Unspecified fall, subsequent encounter: Secondary | ICD-10-CM | POA: Diagnosis present

## 2018-03-04 DIAGNOSIS — Z794 Long term (current) use of insulin: Secondary | ICD-10-CM

## 2018-03-04 DIAGNOSIS — E1122 Type 2 diabetes mellitus with diabetic chronic kidney disease: Secondary | ICD-10-CM | POA: Diagnosis present

## 2018-03-04 DIAGNOSIS — Z86711 Personal history of pulmonary embolism: Secondary | ICD-10-CM

## 2018-03-04 DIAGNOSIS — J45909 Unspecified asthma, uncomplicated: Secondary | ICD-10-CM | POA: Diagnosis present

## 2018-03-04 DIAGNOSIS — E1129 Type 2 diabetes mellitus with other diabetic kidney complication: Secondary | ICD-10-CM | POA: Diagnosis present

## 2018-03-04 DIAGNOSIS — G8929 Other chronic pain: Secondary | ICD-10-CM | POA: Diagnosis not present

## 2018-03-04 DIAGNOSIS — F329 Major depressive disorder, single episode, unspecified: Secondary | ICD-10-CM | POA: Diagnosis not present

## 2018-03-04 DIAGNOSIS — G4700 Insomnia, unspecified: Secondary | ICD-10-CM

## 2018-03-04 DIAGNOSIS — I132 Hypertensive heart and chronic kidney disease with heart failure and with stage 5 chronic kidney disease, or end stage renal disease: Secondary | ICD-10-CM | POA: Diagnosis present

## 2018-03-04 DIAGNOSIS — S82892A Other fracture of left lower leg, initial encounter for closed fracture: Secondary | ICD-10-CM | POA: Diagnosis present

## 2018-03-04 DIAGNOSIS — N2581 Secondary hyperparathyroidism of renal origin: Secondary | ICD-10-CM | POA: Diagnosis present

## 2018-03-04 LAB — CBC
HEMATOCRIT: 25.6 % — AB (ref 36.0–46.0)
Hemoglobin: 7.7 g/dL — ABNORMAL LOW (ref 12.0–15.0)
MCH: 24.3 pg — AB (ref 26.0–34.0)
MCHC: 30.1 g/dL (ref 30.0–36.0)
MCV: 80.8 fL (ref 78.0–100.0)
PLATELETS: 200 10*3/uL (ref 150–400)
RBC: 3.17 MIL/uL — ABNORMAL LOW (ref 3.87–5.11)
RDW: 17 % — AB (ref 11.5–15.5)
WBC: 5.2 10*3/uL (ref 4.0–10.5)

## 2018-03-04 LAB — RENAL FUNCTION PANEL
Albumin: 3.2 g/dL — ABNORMAL LOW (ref 3.5–5.0)
Anion gap: 15 (ref 5–15)
BUN: 95 mg/dL — ABNORMAL HIGH (ref 6–20)
CHLORIDE: 101 mmol/L (ref 98–111)
CO2: 20 mmol/L — AB (ref 22–32)
CREATININE: 12.68 mg/dL — AB (ref 0.44–1.00)
Calcium: 7.3 mg/dL — ABNORMAL LOW (ref 8.9–10.3)
GFR calc Af Amer: 3 mL/min — ABNORMAL LOW (ref >60)
GFR calc non Af Amer: 3 mL/min — ABNORMAL LOW (ref >60)
GLUCOSE: 162 mg/dL — AB (ref 70–99)
POTASSIUM: 6.7 mmol/L — AB (ref 3.5–5.1)
Phosphorus: 8.3 mg/dL — ABNORMAL HIGH (ref 2.5–4.6)
Sodium: 136 mmol/L (ref 135–145)

## 2018-03-04 LAB — GLUCOSE, CAPILLARY
Glucose-Capillary: 175 mg/dL — ABNORMAL HIGH (ref 70–99)
Glucose-Capillary: 134 mg/dL — ABNORMAL HIGH (ref 70–99)
Glucose-Capillary: 150 mg/dL — ABNORMAL HIGH (ref 70–99)
Glucose-Capillary: 190 mg/dL — ABNORMAL HIGH (ref 70–99)

## 2018-03-04 LAB — MRSA PCR SCREENING

## 2018-03-04 MED ORDER — HEPARIN SODIUM (PORCINE) 1000 UNIT/ML IJ SOLN
INTRAMUSCULAR | Status: AC
  Administered 2018-03-04: 2000 [IU] via INTRAVENOUS_CENTRAL
  Filled 2018-03-04: qty 2

## 2018-03-04 MED ORDER — CALCIUM ACETATE (PHOS BINDER) 667 MG PO CAPS
667.0000 mg | ORAL_CAPSULE | Freq: Three times a day (TID) | ORAL | Status: DC
  Administered 2018-03-05 – 2018-03-08 (×10): 667 mg via ORAL
  Filled 2018-03-04 (×12): qty 1

## 2018-03-04 MED ORDER — RENA-VITE PO TABS
1.0000 | ORAL_TABLET | Freq: Every day | ORAL | Status: DC
  Administered 2018-03-04 – 2018-03-07 (×3): 1 via ORAL
  Filled 2018-03-04 (×6): qty 1

## 2018-03-04 MED ORDER — METHOCARBAMOL 500 MG PO TABS
500.0000 mg | ORAL_TABLET | Freq: Three times a day (TID) | ORAL | Status: DC | PRN
  Administered 2018-03-04: 500 mg via ORAL
  Filled 2018-03-04: qty 1

## 2018-03-04 MED ORDER — AMLODIPINE BESYLATE 5 MG PO TABS
5.0000 mg | ORAL_TABLET | Freq: Every day | ORAL | Status: DC
  Administered 2018-03-05 – 2018-03-08 (×4): 5 mg via ORAL
  Filled 2018-03-04 (×4): qty 1

## 2018-03-04 MED ORDER — ALTEPLASE 2 MG IJ SOLR: 2.0000 mg | Freq: Once | INTRAMUSCULAR | Status: DC | PRN

## 2018-03-04 MED ORDER — LIDOCAINE-PRILOCAINE 2.5-2.5 % EX CREA: 1.0000 "application " | TOPICAL_CREAM | CUTANEOUS | Status: DC | PRN

## 2018-03-04 MED ORDER — INSULIN ASPART 100 UNIT/ML ~~LOC~~ SOLN
0.0000 [IU] | Freq: Three times a day (TID) | SUBCUTANEOUS | Status: DC
  Administered 2018-03-05 (×2): 2 [IU] via SUBCUTANEOUS
  Administered 2018-03-05: 3 [IU] via SUBCUTANEOUS
  Administered 2018-03-06 (×2): 2 [IU] via SUBCUTANEOUS
  Administered 2018-03-07 (×2): 1 [IU] via SUBCUTANEOUS
  Administered 2018-03-07: 3 [IU] via SUBCUTANEOUS
  Administered 2018-03-08 (×2): 2 [IU] via SUBCUTANEOUS
  Filled 2018-03-04 (×4): qty 1

## 2018-03-04 MED ORDER — SEVELAMER CARBONATE 800 MG PO TABS
800.0000 mg | ORAL_TABLET | Freq: Three times a day (TID) | ORAL | Status: DC
  Administered 2018-03-05 – 2018-03-08 (×10): 800 mg via ORAL
  Filled 2018-03-04 (×12): qty 1

## 2018-03-04 MED ORDER — ALUM & MAG HYDROXIDE-SIMETH 200-200-20 MG/5ML PO SUSP: 30.0000 mL | ORAL | Status: DC | PRN

## 2018-03-04 MED ORDER — DOXERCALCIFEROL 4 MCG/2ML IV SOLN
INTRAVENOUS | Status: AC
  Filled 2018-03-04: qty 2

## 2018-03-04 MED ORDER — HYDRALAZINE HCL 50 MG PO TABS: 50.0000 mg | ORAL_TABLET | Freq: Three times a day (TID) | ORAL | 0 refills | Status: DC

## 2018-03-04 MED ORDER — CLOPIDOGREL BISULFATE 75 MG PO TABS
75.0000 mg | ORAL_TABLET | Freq: Every day | ORAL | Status: DC
  Administered 2018-03-05 – 2018-03-08 (×4): 75 mg via ORAL
  Filled 2018-03-04 (×4): qty 1

## 2018-03-04 MED ORDER — ALBUTEROL SULFATE HFA 108 (90 BASE) MCG/ACT IN AERS
2.0000 | INHALATION_SPRAY | Freq: Four times a day (QID) | RESPIRATORY_TRACT | Status: DC | PRN
  Filled 2018-03-04: qty 6.7

## 2018-03-04 MED ORDER — LIDOCAINE HCL (PF) 1 % IJ SOLN: 5.0000 mL | INTRAMUSCULAR | Status: DC | PRN

## 2018-03-04 MED ORDER — GABAPENTIN 300 MG PO CAPS
300.0000 mg | ORAL_CAPSULE | Freq: Every day | ORAL | Status: DC
  Administered 2018-03-04 – 2018-03-07 (×4): 300 mg via ORAL
  Filled 2018-03-04 (×4): qty 1

## 2018-03-04 MED ORDER — SODIUM CHLORIDE 0.9 % IV SOLN: 100.0000 mL | INTRAVENOUS | Status: DC | PRN

## 2018-03-04 MED ORDER — TRAZODONE HCL 100 MG PO TABS
100.0000 mg | ORAL_TABLET | Freq: Every evening | ORAL | Status: DC | PRN
  Administered 2018-03-06 – 2018-03-07 (×2): 100 mg via ORAL
  Filled 2018-03-04 (×4): qty 1

## 2018-03-04 MED ORDER — HEPARIN SODIUM (PORCINE) 1000 UNIT/ML DIALYSIS
20.0000 [IU]/kg | INTRAMUSCULAR | Status: DC | PRN
  Administered 2018-03-04: 2000 [IU] via INTRAVENOUS_CENTRAL
  Filled 2018-03-04: qty 2

## 2018-03-04 MED ORDER — HYDRALAZINE HCL 50 MG PO TABS
50.0000 mg | ORAL_TABLET | Freq: Three times a day (TID) | ORAL | Status: DC
  Administered 2018-03-04: 50 mg via ORAL
  Filled 2018-03-04: qty 1

## 2018-03-04 MED ORDER — ACETAMINOPHEN 325 MG PO TABS
650.0000 mg | ORAL_TABLET | Freq: Four times a day (QID) | ORAL | Status: DC | PRN
  Administered 2018-03-04: 650 mg via ORAL
  Filled 2018-03-04: qty 2

## 2018-03-04 MED ORDER — ONDANSETRON 4 MG PO TBDP: 4.0000 mg | ORAL_TABLET | Freq: Three times a day (TID) | ORAL | Status: DC | PRN

## 2018-03-04 MED ORDER — PENTAFLUOROPROP-TETRAFLUOROETH EX AERO: 1.0000 "application " | INHALATION_SPRAY | CUTANEOUS | Status: DC | PRN

## 2018-03-04 MED ORDER — CARVEDILOL 6.25 MG PO TABS
6.2500 mg | ORAL_TABLET | Freq: Two times a day (BID) | ORAL | Status: DC
  Administered 2018-03-05 – 2018-03-08 (×6): 6.25 mg via ORAL
  Filled 2018-03-04 (×6): qty 1

## 2018-03-04 MED ORDER — HEPARIN SODIUM (PORCINE) 1000 UNIT/ML DIALYSIS: 1000.0000 [IU] | INTRAMUSCULAR | Status: DC | PRN

## 2018-03-04 MED ORDER — LATANOPROST 0.005 % OP SOLN
1.0000 [drp] | Freq: Every day | OPHTHALMIC | Status: DC
  Administered 2018-03-04 – 2018-03-07 (×4): 1 [drp] via OPHTHALMIC
  Filled 2018-03-04: qty 2.5

## 2018-03-04 MED ORDER — HYDROXYZINE HCL 25 MG PO TABS: 25.0000 mg | ORAL_TABLET | Freq: Three times a day (TID) | ORAL | Status: DC | PRN

## 2018-03-04 MED ORDER — ESCITALOPRAM OXALATE 10 MG PO TABS
10.0000 mg | ORAL_TABLET | Freq: Every day | ORAL | Status: DC
  Administered 2018-03-05 – 2018-03-08 (×4): 10 mg via ORAL
  Filled 2018-03-04 (×4): qty 1

## 2018-03-04 MED ORDER — MAGNESIUM HYDROXIDE 400 MG/5ML PO SUSP: 30.0000 mL | Freq: Every day | ORAL | Status: DC | PRN

## 2018-03-04 MED ORDER — ATOMOXETINE HCL 40 MG PO CAPS
40.0000 mg | ORAL_CAPSULE | Freq: Every day | ORAL | Status: DC
  Administered 2018-03-05 – 2018-03-08 (×4): 40 mg via ORAL
  Filled 2018-03-04 (×2): qty 4
  Filled 2018-03-04: qty 1
  Filled 2018-03-04 (×2): qty 4
  Filled 2018-03-04: qty 1
  Filled 2018-03-04: qty 4

## 2018-03-04 NOTE — ED Notes (Signed)
[facility] here to transfer pt to [facility]. 

## 2018-03-04 NOTE — Discharge Summary (Signed)
[facility] Discharge Summary   Patient: Lauren Warner MKL:[id number]   PCP: [name], [name] DOB: 25-Apr-1966   Date of admission: 03/03/2018   Date of discharge:  03/04/2018    Discharge Diagnoses:  Principal Problem:   Hyperkalemia Active Problems:   Essential hypertension   CAD (coronary artery disease)   ESRD (end stage renal disease) ([place])   DM (diabetes mellitus), type 2 with renal complications ([place])   Suicidal ideation  Admitted From: home Disposition:  [place] at [facility]  Recommendations for Outpatient Follow-up:  1. Please follow-up with PCP after discharge from behavioral health in 1 week  [place], Brennan Bailey, [place]. Schedule an appointment as soon as possible for a visit in 1 week(s).   Specialty:  Internal Medicine Contact information: [address] [place] [address] [phone]          Diet recommendation: Renal diet carb modified  Activity: The patient is advised to gradually reintroduce usual activities.  Discharge Condition: good  Code Status: Full code  History of present illness: As per the H and P dictated on admission, "Lauren Warner is a 52 y.o. female with history of ESRD on hemodialysis missed last 2 sessions because of issues with transportation and also with issues at home was brought to the ER because patient has been having some suicidal thoughts with plans to overdose with insulin.  Patient denies any chest pain or shortness of breath has chronic left ankle pain from previous injury.  ED Course: In the ER labs revealed potassium of 6 with no EKG changes.  Chest x-ray shows mild congestion.  On exam patient has left leg placed on brace.  Patient is not in any respiratory distress.  X-rays of the left ankle shows chronic changes.  Patient had similar changes and during previous admission.  On-call nephrologist Dr. Clover Mealy was consulted and patient transferred from dialysis to [facility] Course:  Summary of her active problems in the hospital is as following. Principal Problem:   Hyperkalemia ESRD on HD TTS. Anemia of chronic kidney disease. Accelerated hypertension. Presented with complaint of missing hemodialysis with volume overload as well as hyperkalemia. Underwent hemodialysis and treatment went very well. Blood pressure was significantly elevated before hemodialysis improved after hemodialysis. Hydralazine added on top of amlodipine and Coreg for blood pressure management. Anticipate blood pressure will continue to get better with further sessions of hemodialysis.  Anemia of chronic kidney disease. No active bleeding reported. H&H low but not indicating transfusion for now. Also volume overload causing hemodilution as well. Recommend recheck hemoglobin in 1 week. Continue EPO and iron with HD.  CAD. Continue statin and aspirin.  Depression with suicidal ideation. Patient has a plan of killing herself with insulin overdose. Psychiatric consulted. Appreciate their assistance. Inpatient psychiatric recommended. Arranged at [facility] behavioral health.  Type II DM. We will continue home regimen for now.  All other chronic medical condition were stable during the hospitalization.  Patient was ambulatory without any assistance. On the day of the discharge the patient's vitals were stable , and no other acute medical condition were reported by patient. the patient was felt safe to be discharge at [facility] with [facility] center.  Consultants: nephrology Psychiatry Procedures: HD  DISCHARGE MEDICATION: Allergies as of 03/04/2018      Reactions   Adhesive [tape] Other (See Comments)   "RIPS MY SKIN," [place]!!   Hydrocodone-acetaminophen Other (See Comments)   "  Makes me feel crazy"   Metformin And Related Other (See Comments)   "Makes me feel crazy"      Medication List    STOP taking these medications   heparin 1000 unit/mL  Soln injection     TAKE these medications   albuterol 108 (90 Base) MCG/ACT inhaler Commonly known as:  PROVENTIL HFA;VENTOLIN HFA Inhale 2 puffs into the lungs every 6 (six) hours as needed for wheezing or shortness of breath.   amLODipine 5 MG tablet Commonly known as:  NORVASC Take 5 mg by mouth daily.   aspirin EC 81 MG tablet Take 1 tablet (81 mg total) by mouth daily. With food   atomoxetine 40 MG capsule Commonly known as:  STRATTERA Take 40 mg by mouth daily.   blood glucose meter kit and supplies Kit Dispense based on patient and insurance preference. Use up to four times daily as directed. (FOR ICD-9 250.00, 250.01).   calcium acetate 667 MG capsule Commonly known as:  PHOSLO Take 1 capsule (667 mg total) by mouth 3 (three) times daily with meals.   carvedilol 6.25 MG tablet Commonly known as:  COREG Take 1 tablet (6.25 mg total) by mouth 2 (two) times daily with a meal.   Chlorhexidine Gluconate Cloth 2 % Pads Apply 6 each topically daily at 6 (six) AM.   clopidogrel 75 MG tablet Commonly known as:  PLAVIX Take 1 tablet (75 mg total) by mouth daily.   escitalopram 10 MG tablet Commonly known as:  LEXAPRO Take 1 tablet (10 mg total) by mouth daily.   feeding supplement (NEPRO CARB STEADY) Liqd Take 237 mLs by mouth 2 (two) times daily between meals.   gabapentin 300 MG capsule Commonly known as:  NEURONTIN Take 1 capsule (300 mg total) by mouth at bedtime.   hydrALAZINE 50 MG tablet Commonly known as:  APRESOLINE Take 1 tablet (50 mg total) by mouth 3 (three) times daily.   insulin lispro 100 UNIT/ML KiwkPen Commonly known as:  HUMALOG Inject 0.03 mLs (3 Units total) into the skin 3 (three) times daily.   latanoprost 0.005 % ophthalmic solution Commonly known as:  XALATAN Place 1 drop into both eyes at bedtime.   lidocaine-prilocaine cream Commonly known as:  EMLA Apply 1 application topically as needed. 1 hour before dialysis   multivitamin  Tabs tablet Take 1 tablet by mouth at bedtime.   ondansetron 4 MG tablet Commonly known as:  ZOFRAN Take 1 tablet (4 mg total) by mouth every 8 (eight) hours as needed for nausea or vomiting.   sevelamer carbonate 800 MG tablet Commonly known as:  RENVELA Take 3 tablets (2,400 mg total) by mouth 3 (three) times daily with meals.      Allergies  Allergen Reactions  . Adhesive [Tape] Other (See Comments)    "RIPS MY SKIN," SO PLEASE USE COBAN WRAP!!  . Hydrocodone-Acetaminophen Other (See Comments)    "Makes me feel crazy"  . Metformin And Related Other (See Comments)    "Makes me feel crazy"   Discharge Instructions    Diet renal [id number]   Complete by:  As directed    Discharge instructions   Complete by:  As directed    It is important that you read following instructions as well as go over your medication list with RN to help you understand your care after this hospitalization.  Discharge Instructions: Please follow-up with PCP in one week  Please request your primary care physician to go over all [facility]  Tests and Procedure/Radiological results at the follow up,  Please get all Hospital records sent to your PCP by signing hospital release before you go home.   Do not take more than prescribed Pain, Sleep and Anxiety Medications. You were cared for by a hospitalist during your hospital stay. If you have any questions about your discharge medications or the care you received while you were in the hospital after you are discharged, you can call the unit and ask to speak with the hospitalist on call if the hospitalist that took care of you is not available.  Once you are discharged, your primary care physician will handle any further medical issues. Please note that NO REFILLS for any discharge medications will be authorized once you are discharged, as it is imperative that you return to your primary care physician (or establish a relationship with a primary care physician  if you do not have one) for your aftercare needs so that they can reassess your need for medications and monitor your lab values. You Must read complete instructions/literature along with all the possible adverse reactions/side effects for all the Medicines you take and that have been prescribed to you. Take any new Medicines after you have completely understood and accept all the possible adverse reactions/side effects. Wear Seat belts while driving. If you have smoked or chewed Tobacco in the last 2 yrs please stop smoking and/or stop any Recreational drug use.   Increase activity slowly   Complete by:  As directed      Discharge Exam: Filed Weights   03/04/18 0725 03/04/18 1149  Weight: 99.4 kg 92.5 kg   Vitals:   03/04/18 1223 03/04/18 1727  BP: (!) 179/80 (!) 179/86  Pulse: 83 78  Resp: 18 18  Temp: 97.8 F (36.6 C) 98.6 F (37 C)  SpO2: 100% 100%   General: Appear in no distress, no Rash; Oral Mucosa moist. Cardiovascular: S1 and S2 Present, no Murmur, no JVD Respiratory: Bilateral Air entry present and Clear to Auscultation, no Crackles, no wheezes Abdomen: Bowel Sound present, Soft and no tenderness Extremities: no Pedal edema, no calf tenderness Neurology: Grossly no focal neuro deficit.  The results of significant diagnostics from this hospitalization (including imaging, microbiology, ancillary and laboratory) are listed below for reference.    Significant Diagnostic Studies: Dg Chest 2 View  Result Date: 03/03/2018 CLINICAL DATA:  Suicidal thoughts and depression. EXAM: CHEST - 2 VIEW COMPARISON:  None. FINDINGS: Cardiomegaly. The hila and mediastinum are unremarkable. No pneumothorax. No nodules or masses. No focal infiltrates. Mild interstitial prominence on the right is likely due to patient rotation. Mild asymmetric pulmonary venous congestion not excluded. No other acute abnormalities. IMPRESSION: 1. Mild increased interstitial opacities on the right are probably  due to patient rotation. Mild asymmetric pulmonary venous congestion possible. No other acute abnormalities. Electronically Signed   By: Dorise Bullion III M.D   On: 03/03/2018 18:21   Dg Chest 2 View  Result Date: 02/16/2018 CLINICAL DATA:  Shortness of breath, vomiting, chest heaviness EXAM: CHEST - 2 VIEW COMPARISON:  01/21/2018 FINDINGS: Cardiomegaly. Mild interstitial prominence throughout the lungs, likely interstitial edema. No confluent opacities or effusions. Overall, edema pattern slightly less than prior study. IMPRESSION: Cardiomegaly with mild interstitial edema, slightly less than prior study. Electronically Signed   By: Rolm Baptise M.D.   On: 02/16/2018 11:48   Dg Ankle Complete Left  Result Date: 03/03/2018 CLINICAL DATA:  Pain EXAM: LEFT ANKLE COMPLETE - 3+ VIEW COMPARISON:  None.  FINDINGS: Significant soft tissue swelling anterior to the ankle and hindfoot. The talus and adjacent navicular bone are abnormal with fragmentation. There also appears to be fragmentation through the anterior distal talus. Vascular calcifications. A calcification adjacent to the distal fibula likely represents sequela of an avulsion injury. No other acute abnormalities. IMPRESSION: 1. The talus is abnormal in appearance with deformation. There also appears to be an abnormality in the adjacent distal tibia on the lateral view and the adjacent navicular bone. I suspect the findings are sequela of trauma, age indeterminate. There is significant overlying soft tissue swelling. Electronically Signed   By: Dorise Bullion III M.D   On: 03/03/2018 18:30   Dg Foot Complete Left  Result Date: 03/03/2018 CLINICAL DATA:  Left foot pain and swelling. EXAM: LEFT FOOT - COMPLETE 3+ VIEW COMPARISON:  None. FINDINGS: Vascular calcifications. Soft tissue swelling in the ankle and hindfoot. No fractures in the phalanges or metatarsals. The talus and navicular bones are abnormal in appearance with mottled lucencies and  apparent fragmentation based on the lateral view. No other acute abnormalities. IMPRESSION: 1. The talus and navicular bones are abnormal with apparent fragmentation on the lateral view and a somewhat mottled appearance on the AP and oblique views. There is focal soft tissue swelling in this region as well. The findings could represent sequela of age-indeterminate trauma. The focal swelling in this region suggests an acute component to the process. Recommend clinical correlation. This could be better evaluated with CT or MRI. Electronically Signed   By: Dorise Bullion III M.D   On: 03/03/2018 18:27    Microbiology: Recent Results (from the past 240 hour(s))  MRSA PCR Screening     Status: Abnormal   Collection Time: 03/04/18  2:12 AM  Result Value Ref Range Status   MRSA by PCR (A) NEGATIVE Final    INVALID, UNABLE TO DETERMINE THE PRESENCE OF TARGET DNA DUE TO SPECIMEN INTEGRITY. RECOLLECTION REQUESTED.    CommentAngela Burke RN [id number] 03/04/18 A BROWNING Performed at [facility], [place] [address] [place], [place] [address]      Labs: CBC: Recent Labs  Lab 03/03/18 1545 03/04/18 0645  WBC 5.0 5.2  HGB 8.1* 7.7*  HCT 26.8* 25.6*  MCV 78.4 80.8  PLT 262 388   Basic Metabolic Panel: Recent Labs  Lab 03/03/18 1545 03/04/18 0645  NA 140 136  K 6.0* 6.7*  CL 99 101  CO2 27 20*  GLUCOSE 227* 162*  BUN 89* 95*  CREATININE 12.31* 12.68*  CALCIUM 7.8* 7.3*  PHOS  --  8.3*   Liver Function Tests: Recent Labs  Lab 03/03/18 1545 03/04/18 0645  AST 8*  --   ALT 15  --   ALKPHOS 98  --   BILITOT 0.5  --   PROT 7.4  --   ALBUMIN 3.6 3.2*   No results for input(s): LIPASE, AMYLASE in the last 168 hours. No results for input(s): AMMONIA in the last 168 hours. Cardiac Enzymes: No results for input(s): CKTOTAL, CKMB, CKMBINDEX, TROPONINI in the last 168 hours. BNP (last 3 results) No results for input(s): BNP in the last 8760 hours. CBG: Recent Labs  Lab 03/04/18 0155  03/04/18 1148 03/04/18 1724  GLUCAP 175* 134* 150*   Time spent: 35 minutes  Signed:  Berle Mull  [facility]  03/04/2018  , 6:29 PM

## 2018-03-04 NOTE — Progress Notes (Signed)
This RN and Clinical Social Worker, Lorriane Shire, and patient found and counted $398.00 in cash in her patient belongings inside of her wallet. Patient also has silver anklet and gold elephant bracelet in her belongings. They will be sent on transfer in her white pocket book to be locked up. Dorthey Sawyer, RN

## 2018-03-04 NOTE — Progress Notes (Signed)
Pt is being accepted at [facility] due to dialysis needs. Bed TBD.  CSW contacted [facility] and spoke with Charge Nurse who agreed to having pt's nurse review [place] Consent for Treatment, have pt sign and fax back to [facility] @[phone] ATTN: Kerry Dory.  [name]. Judi Cong, MSW, [place] Disposition Clinical Social Work [phone] (cell) [phone] (office)

## 2018-03-04 NOTE — BH Assessment (Signed)
Patient has been accepted to [facility].  Accepting physician is Dr. Bary Leriche.  Attending Physician will be Dr. Mellody Dance.  Patient has been assigned to room 310, by [place].  Call report to [phone].  Representative/Transfer Coordinator is Dispensing optician Patient pre-admitted by [facility] Patient Access Elberta Fortis)  [facility] Staff Romie Minus & Judson Roch, Disposition SW) made aware of acceptance.

## 2018-03-04 NOTE — ED Notes (Signed)
ED TO INPATIENT HANDOFF REPORT  Name/Age/Gender Lauren Warner 52 y.o. female  Code Status    Code Status Orders  (From admission, onward)         Start     Ordered   03/03/18 2319  Full code  Continuous     03/03/18 2320        Code Status History    Date Active Date Inactive Code Status Order ID Comments User Context   01/21/2018 1446 01/25/2018 2012 Full Code [id number]  Reyne Dumas, MD Inpatient   01/07/2018 2237 01/10/2018 1753 Full Code [id number]  Rise Patience, MD ED   12/23/2017 0117 12/24/2017 2106 Full Code [id number]  Rise Patience, MD ED   12/03/2017 1813 12/04/2017 2344 Full Code [id number]  Jonetta Osgood, MD Inpatient   11/10/2017 1542 11/12/2017 1736 Full Code [id number]  Fransico Meadow, PA-C ED   10/19/2017 0242 10/21/2017 2039 Full Code [id number]  Rise Patience, MD ED      Home/SNF/Other Home  Chief Complaint SI   Level of Care/Admitting Diagnosis ED Disposition    ED Disposition Condition Comment   [place]: [facility] [100100]  Level of Care: Telemetry [5]  I expect the patient will be discharged within 24 hours: No (not a candidate for 5C-Observation unit)  Diagnosis: Hyperkalemia [976734]  Admitting Physician: Rise Patience [phone]  Attending Physician: Rise Patience [phone]  PT Class (Do Not Modify): Observation [104]  PT Acc Code (Do Not Modify): Observation [10022]       Medical History Past Medical History:  Diagnosis Date  . Anemia   . Anxiety   . Asthma   . CHF (congestive heart failure) ([facility])   . Coronary artery disease   . Daily headache   . Depression   . ESRD (end stage renal disease) on dialysis ([place])    "[facility]; TTS" (10/19/2017)  . High cholesterol   . History of blood transfusion 10/19/2017   "anemia"  . Hyperkalemia 10/2017  . Hypertension   . On home oxygen therapy    "2L; daily" (10/19/2017)  . Pneumonia    "several times" (10/19/2017)  . Pulmonary  embolism ([place]) 2012  . Type 1 diabetes mellitus ([facility])     Allergies Allergies  Allergen Reactions  . Adhesive [Tape] Other (See Comments)    "RIPS MY SKIN," SO PLEASE USE COBAN WRAP!!  . Hydrocodone-Acetaminophen Other (See Comments)    "Makes me feel crazy"  . Metformin And Related Other (See Comments)    "Makes me feel crazy"    IV Location/Drains/Wounds Patient Lines/Drains/Airways Status   Active Line/Drains/Airways    Name:   Placement date:   Placement time:   Site:   Days:   Fistula / Graft Left Upper arm Arteriovenous fistula   -    -    Upper arm             Labs/Imaging Results for orders placed or performed during the hospital encounter of 03/03/18 (from the past 48 hour(s))  Comprehensive metabolic panel     Status: Abnormal   Collection Time: 03/03/18  3:45 PM  Result Value Ref Range   Sodium 140 135 - 145 mmol/L   Potassium 6.0 (H) 3.5 - 5.1 mmol/L   Chloride 99 98 - 111 mmol/L   CO2 27 22 - 32 mmol/L   Glucose, Bld 227 (H) 70 - 99 mg/dL   BUN 89 (H) 6 - 20 mg/dL  Creatinine, Ser 12.31 (H) 0.44 - 1.00 mg/dL   Calcium 7.8 (L) 8.9 - 10.3 mg/dL   Total Protein 7.4 6.5 - 8.1 g/dL   Albumin 3.6 3.5 - 5.0 g/dL   AST 8 (L) 15 - 41 U/L   ALT 15 0 - 44 U/L   Alkaline Phosphatase 98 38 - 126 U/L   Total Bilirubin 0.5 0.3 - 1.2 mg/dL   GFR calc non Af Amer 3 (L) >60 mL/min   GFR calc Af Amer 4 (L) >60 mL/min    Comment: (NOTE) The eGFR has been calculated using the CKD EPI equation. This calculation has not been validated in all clinical situations. eGFR's persistently <60 mL/min signify possible Chronic Kidney Disease.    Anion gap 14 5 - 15    Comment: Performed at [facility], [place] [address] [place], [place] [address]  Ethanol     Status: None   Collection Time: 03/03/18  3:45 PM  Result Value Ref Range   Alcohol, Ethyl (B) <10 <10 mg/dL    Comment: (NOTE) Lowest detectable limit for serum alcohol is 10 mg/dL. For medical  purposes only. Performed at [facility], [place] [address] [place], [place] [address]   Salicylate level     Status: None   Collection Time: 03/03/18  3:45 PM  Result Value Ref Range   Salicylate Lvl <3.3 2.8 - 30.0 mg/dL    Comment: Performed at [facility], [place] [address], [place], [place] [address]  Acetaminophen level     Status: Abnormal   Collection Time: 03/03/18  3:45 PM  Result Value Ref Range   Acetaminophen (Tylenol), Serum <10 (L) 10 - 30 ug/mL    Comment: (NOTE) Therapeutic concentrations vary significantly. A range of 10-30 ug/mL  may be an effective concentration for many patients. However, some  are best treated at concentrations outside of this range. Acetaminophen concentrations >150 ug/mL at 4 hours after ingestion  and >50 ug/mL at 12 hours after ingestion are often associated with  toxic reactions. Performed at [facility], [place] [address], [place], [place] [address]   cbc     Status: Abnormal   Collection Time: 03/03/18  3:45 PM  Result Value Ref Range   WBC 5.0 4.0 - 10.5 K/uL   RBC 3.42 (L) 3.87 - 5.11 MIL/uL   Hemoglobin 8.1 (L) 12.0 - 15.0 g/dL   HCT 26.8 (L) 36.0 - 46.0 %   MCV 78.4 78.0 - 100.0 fL   MCH 23.7 (L) 26.0 - 34.0 pg   MCHC 30.2 30.0 - 36.0 g/dL   RDW 17.4 (H) 11.5 - 15.5 %   Platelets 262 150 - 400 K/uL    Comment: Performed at [facility], [place] [address], [place], [place] [address]  I-Stat beta hCG blood, ED     Status: Abnormal   Collection Time: 03/03/18  4:04 PM  Result Value Ref Range   I-stat hCG, quantitative 8.5 (H) <5 mIU/mL   Comment 3            Comment:   GEST. AGE      CONC.  (mIU/mL)   <=1 WEEK        5 - 50     2 WEEKS       50 - 500     3 WEEKS       100 - 10,000     4 WEEKS     1,000 -  30,000        FEMALE AND NON-PREGNANT FEMALE:     LESS THAN 5 mIU/mL    Dg Chest 2 View  Result Date: 03/03/2018 CLINICAL DATA:  Suicidal thoughts and  depression. EXAM: CHEST - 2 VIEW COMPARISON:  None. FINDINGS: Cardiomegaly. The hila and mediastinum are unremarkable. No pneumothorax. No nodules or masses. No focal infiltrates. Mild interstitial prominence on the right is likely due to patient rotation. Mild asymmetric pulmonary venous congestion not excluded. No other acute abnormalities. IMPRESSION: 1. Mild increased interstitial opacities on the right are probably due to patient rotation. Mild asymmetric pulmonary venous congestion possible. No other acute abnormalities. Electronically Signed   By: Dorise Bullion III M.D   On: 03/03/2018 18:21   Dg Ankle Complete Left  Result Date: 03/03/2018 CLINICAL DATA:  Pain EXAM: LEFT ANKLE COMPLETE - 3+ VIEW COMPARISON:  None. FINDINGS: Significant soft tissue swelling anterior to the ankle and hindfoot. The talus and adjacent navicular bone are abnormal with fragmentation. There also appears to be fragmentation through the anterior distal talus. Vascular calcifications. A calcification adjacent to the distal fibula likely represents sequela of an avulsion injury. No other acute abnormalities. IMPRESSION: 1. The talus is abnormal in appearance with deformation. There also appears to be an abnormality in the adjacent distal tibia on the lateral view and the adjacent navicular bone. I suspect the findings are sequela of trauma, age indeterminate. There is significant overlying soft tissue swelling. Electronically Signed   By: Dorise Bullion III M.D   On: 03/03/2018 18:30   Dg Foot Complete Left  Result Date: 03/03/2018 CLINICAL DATA:  Left foot pain and swelling. EXAM: LEFT FOOT - COMPLETE 3+ VIEW COMPARISON:  None. FINDINGS: Vascular calcifications. Soft tissue swelling in the ankle and hindfoot. No fractures in the phalanges or metatarsals. The talus and navicular bones are abnormal in appearance with mottled lucencies and apparent fragmentation based on the lateral view. No other acute abnormalities.  IMPRESSION: 1. The talus and navicular bones are abnormal with apparent fragmentation on the lateral view and a somewhat mottled appearance on the AP and oblique views. There is focal soft tissue swelling in this region as well. The findings could represent sequela of age-indeterminate trauma. The focal swelling in this region suggests an acute component to the process. Recommend clinical correlation. This could be better evaluated with CT or MRI. Electronically Signed   By: Dorise Bullion III M.D   On: 03/03/2018 18:27    Pending Labs Unresulted Labs (From admission, onward)    Start     Ordered   03/04/18 4944  Basic metabolic panel  Tomorrow morning,   R     03/03/18 2320   03/04/18 0500  CBC  Tomorrow morning,   R     03/03/18 2320   03/03/18 2318  CBC  (heparin)  Once,   R    Comments:  Baseline for heparin therapy IF NOT ALREADY DRAWN.  Notify MD if PLT < 100 K.    03/03/18 2320   03/03/18 2318  Creatinine, serum  (heparin)  Once,   R    Comments:  Baseline for heparin therapy IF NOT ALREADY DRAWN.    03/03/18 2320   03/03/18 1524  Rapid urine drug screen (hospital performed)  Once,   STAT     03/03/18 1525   Signed and Held  Renal function panel  Once,   R     Signed and Held   Signed and Held  CBC  Once,   R     Signed and Held          Vitals/Pain Today's Vitals   03/03/18 2151 03/03/18 2200 03/03/18 2359 03/04/18 0000  BP: (!) 154/81 (!) 155/83 (!) 144/79 (!) 145/92  Pulse: 70 71 67 69  Resp: (!) 22 16 16 18   Temp:      TempSrc:      SpO2: 95% 97% 99% 100%  PainSc:        Isolation Precautions No active isolations  Medications Medications  Chlorhexidine Gluconate Cloth 2 % PADS 6 each (has no administration in time range)  doxercalciferol (HECTOROL) injection 2 mcg (has no administration in time range)  aspirin EC tablet 81 mg (has no administration in time range)  amLODipine (NORVASC) tablet 5 mg (has no administration in time range)  carvedilol (COREG)  tablet 6.25 mg (has no administration in time range)  escitalopram (LEXAPRO) tablet 10 mg (has no administration in time range)  calcium acetate (PHOSLO) capsule 667 mg (has no administration in time range)  sevelamer carbonate (RENVELA) tablet 2,400 mg (has no administration in time range)  clopidogrel (PLAVIX) tablet 75 mg (has no administration in time range)  gabapentin (NEURONTIN) capsule 300 mg (has no administration in time range)  multivitamin (RENA-VIT) tablet 1 tablet (has no administration in time range)  albuterol (PROVENTIL HFA;VENTOLIN HFA) 108 (90 Base) MCG/ACT inhaler 2 puff (has no administration in time range)  latanoprost (XALATAN) 0.005 % ophthalmic solution 1 drop (has no administration in time range)  ondansetron (ZOFRAN) tablet 4 mg (has no administration in time range)    Or  ondansetron (ZOFRAN) injection 4 mg (has no administration in time range)  insulin aspart (novoLOG) injection 0-9 Units (has no administration in time range)  heparin injection 5,000 Units (has no administration in time range)    Mobility walks

## 2018-03-04 NOTE — Clinical Social Work Note (Signed)
Patient discharging to [facility] this evening. Voluntary form faxed to [facility] at [place] driver has original form. Nurse has called report and patient is discharging with 2 bags of her belongings. One bag has her purse, $398.00 in cash, counted in front of patient, ankle and arm bracelets and cell phone. The other bag contains her shoes and clothing. Patient has given CSW permission to call her attorney with legal aid tomorrow to inform her of being in behavioral health as she has eviction paperwork and a court date for next week. CSW also informed patient to request permission to call attorney tomorrow regarding same.  Lauren Warner, MSW, LCSW Licensed Clinical Social Worker [place] [phone]

## 2018-03-04 NOTE — Procedures (Signed)
   I was present at this dialysis session, have reviewed the session itself and made  appropriate changes Kelly Splinter MD [place] pager [phone]   03/04/2018, 11:33 AM
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
## 2018-03-04 NOTE — ED Notes (Signed)
[facility] was notified of pt's need of transportation to [facility]. 

## 2018-03-04 NOTE — Progress Notes (Signed)
[facility] to review. Patient needs dialysis.  [name]. Judi Cong, MSW, [place] Disposition Clinical Social Work [phone] (cell) [phone] (office)

## 2018-03-04 NOTE — Progress Notes (Signed)
Called [facility] for patient coming to their unit. Report given to the RN. Awaiting transport.

## 2018-03-05 ENCOUNTER — Inpatient Hospital Stay: Payer: Medicaid Other

## 2018-03-05 DIAGNOSIS — F332 Major depressive disorder, recurrent severe without psychotic features: Principal | ICD-10-CM

## 2018-03-05 LAB — GLUCOSE, CAPILLARY
GLUCOSE-CAPILLARY: 151 mg/dL — AB (ref 70–99)
GLUCOSE-CAPILLARY: 215 mg/dL — AB (ref 70–99)
Glucose-Capillary: 160 mg/dL — ABNORMAL HIGH (ref 70–99)

## 2018-03-05 MED ORDER — QUETIAPINE FUMARATE 100 MG PO TABS
100.0000 mg | ORAL_TABLET | Freq: Every day | ORAL | Status: DC
  Administered 2018-03-05 – 2018-03-07 (×3): 100 mg via ORAL
  Filled 2018-03-05 (×3): qty 1

## 2018-03-05 MED ORDER — TRAMADOL HCL 50 MG PO TABS
50.0000 mg | ORAL_TABLET | Freq: Four times a day (QID) | ORAL | Status: DC | PRN
  Administered 2018-03-05 – 2018-03-07 (×3): 50 mg via ORAL
  Filled 2018-03-05 (×3): qty 1

## 2018-03-05 NOTE — Progress Notes (Signed)
Patient in room with sitter. Remains asleep and appears to be comfortable in bed. Safety maintained.

## 2018-03-05 NOTE — Tx Team (Signed)
Initial Treatment Plan 03/05/2018 1:11 AM [name] JXB:[id number]    PATIENT STRESSORS:  Financial difficulties Health problems Loss of daughter, marriage      PATIENT STRENGTHS: Average or above average intelligence Communication skills Motivation for treatment/growth   PATIENT IDENTIFIED PROBLEMS: Depression  Suicidal thoughts  Declining physical health                 DISCHARGE CRITERIA:  Ability to meet basic life and health needs Adequate post-discharge living arrangements Improved stabilization in mood, thinking, and/or behavior Medical problems require only outpatient monitoring  PRELIMINARY DISCHARGE PLAN: Outpatient therapy Return to previous living arrangement  PATIENT/FAMILY INVOLVEMENT: This treatment plan has been presented to and reviewed with the patient, Kiala Faraj.  The patient has  been given the opportunity to ask questions and make suggestions.  Ronelle Nigh, RN 03/05/2018, 1:11 AM
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
## 2018-03-05 NOTE — Progress Notes (Signed)
Recreation Therapy Notes  INPATIENT RECREATION THERAPY ASSESSMENT  Patient Details Name: Lauren Warner MRN: [id number] DOB: [birth year] Today's Date: 03/05/2018       Information Obtained From: Patient  Able to Participate in Assessment/Interview: Yes  Patient Presentation: Responsive  Reason for Admission (Per Patient): Active Symptoms  Patient Stressors: Relationship, Death  Coping Skills:   Talk, Art  Leisure Interests (2+):  Art - Draw, Individual - Writing  Frequency of Recreation/Participation: Monthly  Awareness of Community Resources:  No  Community Resources:     Current Use:    If no, Barriers?:    Expressed Interest in [employer] Information:    [place] of Residence:  [place]  Patient Main Form of Transportation: Taxi  Patient Strengths:  Open  Patient Identified Areas of Improvement:  Finances  Patient Goal for Hospitalization:  N/A  Current SI (including self-harm):  No  Current HI:  No  Current AVH: No  Staff Intervention Plan: Group Attendance, Collaborate with Interdisciplinary Treatment Team  Consent to Intern Participation: N/A  Hanny Elsberry 03/05/2018, 2:13 PM
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
## 2018-03-05 NOTE — Progress Notes (Signed)
Patient in bed awake. Reporting the "nightmare" that she experienced while asleep. Alert and oriented. Cooperative and currently denying thoughts of self harm. BP 159/92. Pulse 77. Info reported to oncoming RN for follow up. Staff continue to monitor on 1:1.

## 2018-03-05 NOTE — Progress Notes (Signed)
Patient in bed sleeping. No sing of discomfort. Safety precautions maintained on 1:1 level of observation.

## 2018-03-05 NOTE — [facility] Group Notes (Signed)
[facility] Group Notes:  (Nursing/MHT/Case Management/Adjunct)  Date:  03/05/2018  Time:  9:33 AM  Type of Therapy:  Psychoeducational Skills  Participation Level:  Did Not Attend  Kathi Ludwig 03/05/2018, 9:33 AM
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
## 2018-03-05 NOTE — Plan of Care (Signed)
Patient stated that she can not bear any weight on her left leg.But she can transfer from bed to chair.Patient is with 1:1 sitter for safety.Patient rated her depression and anxiety 9/10.Verbalized passive suicidal thoughts.Patient is pleasant and cooperative on approach.Patient states "I have lot of stresses but I have to go through this."Compliant with medications.Attended groups.Appetite and energy level good.Support and encouragement given.

## 2018-03-05 NOTE — Progress Notes (Signed)
PT Cancellation Note  Patient Details Name: Lauren Warner MRN: [id number] DOB: 11-18-1965   Cancelled Treatment:    Reason Eval/Treat Not Completed: Other (comment).  PT consult received.  Chart reviewed.  Pt noted with potassium of 6.7 on 03/04/18 (per PT guidelines, hold PT for potassium >5.1).  Discussed with pt's nurse who spoke with MD and pt's nurse reporting plan for dialysis tomorrow and nephrology consulted.  Per PT guidelines, will hold PT at this time d/t concern regarding elevated potassium.  Nurse reports pt also has boot on L LE (pt wears whenever OOB).  Leitha Bleak, PT 03/05/18, 2:29 PM [phone]

## 2018-03-05 NOTE — Progress Notes (Signed)
Patient newly admitted with high fall risk: unable to walk. Very unsteady and currently using  A wheelchair for mobility. Recent falls at home. Left ankle swollen and wearing a booth. Very weak, post dialysis. MD was contacted for sitter usage. 1:1 observations initiated per Dr Weber Cooks, MD. Patient currently sleeping. No sign of distress. Sitter and staff will continue to monitor.
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
## 2018-03-05 NOTE — BHH Group Notes (Signed)
  LCSW Group Therapy Note  03/05/2018 1:15pm  Type of Therapy and Topic:  Group Therapy:  Feelings around Relapse and Recovery  Participation Level:  Active   Description of Group:    Patients in this group will discuss emotions they experience before and after a relapse. They will process how experiencing these feelings, or avoidance of experiencing them, relates to having a relapse. Facilitator will guide patients to explore emotions they have related to recovery. Patients will be encouraged to process which emotions are more powerful. They will be guided to discuss the emotional reaction significant others in their lives may have to their relapse or recovery. Patients will be assisted in exploring ways to respond to the emotions of others without this contributing to a relapse.  Therapeutic Goals: 1. Patient will identify two or more emotions that lead to a relapse for them 2. Patient will identify two emotions that result when they relapse 3. Patient will identify two emotions related to recovery 4. Patient will demonstrate ability to communicate their needs through discussion and/or role plays   Summary of Patient Progress:  This patient was very supportive towards her peers and eager to think of creative ways to help a single mother ( peer in group) They both had unique needs and seemed to help each other in this recovery process. Patient was able to identify her struggles with relocation challenges and health care issues ( Hemo dialysis ) Loss of her daughter, She is willing to do the work to reduce her depression. Patient was very supportive towards her peers and will need to focus more on herself and identify her needs ( poster board wish list at [facility]   Therapeutic Modalities:   Cognitive Behavioral Therapy Solution-Focused Therapy Assertiveness Training Relapse Prevention Therapy   Joana Reamer, LCSW 03/05/2018 2:21 PM

## 2018-03-05 NOTE — Progress Notes (Signed)
Patient in bed sleeping. No sign of discomfort. Remains safe in room, sitter at bedside.

## 2018-03-05 NOTE — Progress Notes (Signed)
Patient in bed sleeping, eyes closed, respirations within normal limits. No sign of distress. Safety maintained on 1:1 level of observation.

## 2018-03-05 NOTE — Progress Notes (Signed)
Recreation Therapy Notes  Date: 03/05/2018  Time: 9:30 am   Location: Craft room   Behavioral response: N/A   Intervention Topic: Creative Expressions  Discussion/Intervention: Patient did not attend group.   Clinical Observations/Feedback:  Patient did not attend group.   Courney Garrod LRT/CTRS        Osbaldo Mark 03/05/2018 11:28 AM

## 2018-03-05 NOTE — Progress Notes (Signed)
Patient remains asleep, sitter at bedside. No sign of discomfort

## 2018-03-05 NOTE — Tx Team (Addendum)
Interdisciplinary Treatment and Diagnostic Plan Update  03/05/2018 Time of Session: 10:30 AM Lauren Warner MRN: [id number]  Principal Diagnosis: Major depressive disorder, recurrent severe without psychotic features ([place])  Secondary Diagnoses: Principal Problem:   Major depressive disorder, recurrent severe without psychotic features ([place]) Active Problems:   Essential hypertension   ESRD (end stage renal disease) ([place])   DM (diabetes mellitus), type 2 with renal complications ([place])   Suicidal ideation   Current Medications:  Current Facility-Administered Medications  Medication Dose Route Frequency Provider Last Rate Last Dose  . albuterol (PROVENTIL HFA;VENTOLIN HFA) 108 (90 Base) MCG/ACT inhaler 2 puff  2 puff Inhalation Q6H PRN Pucilowska, Jolanta B, MD      . alum & mag hydroxide-simeth (MAALOX/MYLANTA) 200-200-20 MG/5ML suspension 30 mL  30 mL Oral Q4H PRN Pucilowska, Jolanta B, MD      . amLODipine (NORVASC) tablet 5 mg  5 mg Oral Daily Pucilowska, Jolanta B, MD   5 mg at 03/05/18 0812  . atomoxetine (STRATTERA) capsule 40 mg  40 mg Oral Daily Pucilowska, Jolanta B, MD   40 mg at 03/05/18 0814  . calcium acetate (PHOSLO) capsule 667 mg  667 mg Oral TID WC Pucilowska, Jolanta B, MD   667 mg at 03/05/18 1211  . carvedilol (COREG) tablet 6.25 mg  6.25 mg Oral BID WC Pucilowska, Jolanta B, MD   6.25 mg at 03/05/18 0813  . clopidogrel (PLAVIX) tablet 75 mg  75 mg Oral Daily Pucilowska, Jolanta B, MD   75 mg at 03/05/18 0813  . escitalopram (LEXAPRO) tablet 10 mg  10 mg Oral Daily Pucilowska, Jolanta B, MD   10 mg at 03/05/18 0813  . gabapentin (NEURONTIN) capsule 300 mg  300 mg Oral QHS Pucilowska, Jolanta B, MD   300 mg at 03/04/18 2229  . hydrOXYzine (ATARAX/VISTARIL) tablet 25 mg  25 mg Oral TID PRN Pucilowska, Jolanta B, MD      . insulin aspart (novoLOG) injection 0-9 Units  0-9 Units Subcutaneous TID WC Pucilowska, Jolanta B, MD   2 Units at 03/05/18 1210  . latanoprost (XALATAN)  0.005 % ophthalmic solution 1 drop  1 drop Both Eyes QHS Pucilowska, Jolanta B, MD   1 drop at 03/04/18 2229  . magnesium hydroxide (MILK OF MAGNESIA) suspension 30 mL  30 mL Oral Daily PRN Pucilowska, Jolanta B, MD      . multivitamin (RENA-VIT) tablet 1 tablet  1 tablet Oral QHS Pucilowska, Jolanta B, MD   1 tablet at 03/04/18 2230  . ondansetron (ZOFRAN-ODT) disintegrating tablet 4 mg  4 mg Oral Q8H PRN Pucilowska, Jolanta B, MD      . sevelamer carbonate (RENVELA) tablet 800 mg  800 mg Oral TID WC Pucilowska, Jolanta B, MD   800 mg at 03/05/18 1211  . traZODone (DESYREL) tablet 100 mg  100 mg Oral QHS PRN Pucilowska, Jolanta B, MD       PTA Medications: Medications Prior to Admission  Medication Sig Dispense Refill Last Dose  . albuterol (PROAIR HFA) 108 (90 Base) MCG/ACT inhaler Inhale 2 puffs into the lungs every 6 (six) hours as needed for wheezing or shortness of breath. 1 Inhaler 1 03/03/2018 at Unknown time  . amLODipine (NORVASC) 5 MG tablet Take 5 mg by mouth daily.  0 03/03/2018 at Unknown time  . aspirin EC 81 MG tablet Take 1 tablet (81 mg total) by mouth daily. With food 30 tablet 3 03/03/2018 at Unknown time  . atomoxetine (STRATTERA) 40 MG capsule Take  40 mg by mouth daily.   03/03/2018 at Unknown time  . blood glucose meter kit and supplies KIT Dispense based on patient and insurance preference. Use up to four times daily as directed. (FOR ICD-9 250.00, 250.01). 1 each 0   . calcium acetate (PHOSLO) 667 MG capsule Take 1 capsule (667 mg total) by mouth 3 (three) times daily with meals. 90 capsule 3 03/03/2018 at Unknown time  . carvedilol (COREG) 6.25 MG tablet Take 1 tablet (6.25 mg total) by mouth 2 (two) times daily with a meal. 60 tablet 2 03/03/2018 at 1230 pm  . Chlorhexidine Gluconate Cloth 2 % PADS Apply 6 each topically daily at 6 (six) AM. 60 each 0 03/03/2018 at Unknown time  . clopidogrel (PLAVIX) 75 MG tablet Take 1 tablet (75 mg total) by mouth daily. 30 tablet 3 03/03/2018  at 830 am  . escitalopram (LEXAPRO) 10 MG tablet Take 1 tablet (10 mg total) by mouth daily. 30 tablet 0 03/03/2018 at Unknown time  . gabapentin (NEURONTIN) 300 MG capsule Take 1 capsule (300 mg total) by mouth at bedtime. 30 capsule 0 03/02/2018 at Unknown time  . hydrALAZINE (APRESOLINE) 50 MG tablet Take 1 tablet (50 mg total) by mouth 3 (three) times daily. 30 tablet 0   . insulin lispro (HUMALOG) 100 UNIT/ML KiwkPen Inject 0.03 mLs (3 Units total) into the skin 3 (three) times daily. 15 mL 0 03/03/2018 at Unknown time  . latanoprost (XALATAN) 0.005 % ophthalmic solution Place 1 drop into both eyes at bedtime. 2.5 mL 12 03/02/2018 at Unknown time  . lidocaine-prilocaine (EMLA) cream Apply 1 application topically as needed. 1 hour before dialysis  12 Past Week at Unknown time  . multivitamin (RENA-VIT) TABS tablet Take 1 tablet by mouth at bedtime. 30 tablet 2 03/03/2018 at Unknown time  . Nutritional Supplements (FEEDING SUPPLEMENT, NEPRO CARB STEADY,) LIQD Take 237 mLs by mouth 2 (two) times daily between meals. (Patient not taking: Reported on 03/03/2018) 60 Can 0 Not Taking at Unknown time  . ondansetron (ZOFRAN) 4 MG tablet Take 1 tablet (4 mg total) by mouth every 8 (eight) hours as needed for nausea or vomiting. 10 tablet 0 03/02/2018 at Unknown time  . sevelamer carbonate (RENVELA) 800 MG tablet Take 3 tablets (2,400 mg total) by mouth 3 (three) times daily with meals. 270 tablet 0 03/03/2018 at Unknown time    Patient Stressors: Financial difficulties Health problems Loss of daughter, marriage  Patient Strengths: Average or above average [employer] Motivation for treatment/growth  Treatment Modalities: Medication Management, Group therapy, Case management,  1 to 1 session with clinician, Psychoeducation, Recreational therapy.   Physician Treatment Plan for Primary Diagnosis: Major depressive disorder, recurrent severe without psychotic features ([place]) Long Term  Goal(s):     Short Term Goals:    Medication Management: Evaluate patient's response, side effects, and tolerance of medication regimen.  Therapeutic Interventions: 1 to 1 sessions, Unit Group sessions and Medication administration.  Evaluation of Outcomes: Progressing  Physician Treatment Plan for Secondary Diagnosis: Principal Problem:   Major depressive disorder, recurrent severe without psychotic features ([place]) Active Problems:   Essential hypertension   ESRD (end stage renal disease) ([place])   DM (diabetes mellitus), type 2 with renal complications ([place])   Suicidal ideation  Long Term Goal(s):     Short Term Goals:       Medication Management: Evaluate patient's response, side effects, and tolerance of medication regimen.  Therapeutic Interventions: 1 to 1 sessions, Unit  Group sessions and Medication administration.  Evaluation of Outcomes: Progressing   RN Treatment Plan for Primary Diagnosis: Major depressive disorder, recurrent severe without psychotic features ([place]) Long Term Goal(s): Knowledge of disease and therapeutic regimen to maintain health will improve  Short Term Goals: Ability to remain free from injury will improve, Ability to disclose and discuss suicidal ideas, Ability to identify and develop effective coping behaviors will improve and Compliance with prescribed medications will improve  Medication Management: RN will administer medications as ordered by provider, will assess and evaluate patient's response and provide education to patient for prescribed medication. RN will report any adverse and/or side effects to prescribing provider.  Therapeutic Interventions: 1 on 1 counseling sessions, Psychoeducation, Medication administration, Evaluate responses to treatment, Monitor vital signs and CBGs as ordered, Perform/monitor CIWA, COWS, AIMS and Fall Risk screenings as ordered, Perform wound care treatments as ordered.  Evaluation of Outcomes:  Progressing   LCSW Treatment Plan for Primary Diagnosis: Major depressive disorder, recurrent severe without psychotic features ([place]) Long Term Goal(s): Safe transition to appropriate next level of care at discharge, Engage patient in therapeutic group addressing interpersonal concerns.  Short Term Goals: Engage patient in aftercare planning with referrals and resources and Increase skills for wellness and recovery  Therapeutic Interventions: Assess for all discharge needs, 1 to 1 time with Social worker, Explore available resources and support systems, Assess for adequacy in community support network, Educate family and significant other(s) on suicide prevention, Complete Psychosocial Assessment, Interpersonal group therapy.  Evaluation of Outcomes: Progressing   Progress in Treatment: Attending groups: No. Participating in groups: No. Taking medication as prescribed: Yes. Toleration medication: Yes. Family/Significant other contact made: No, will contact:  CSW will contact support person with consent Patient understands diagnosis: Yes. Discussing patient identified problems/goals with staff: Yes. Medical problems stabilized or resolved: Yes. Denies suicidal/homicidal ideation: Yes. Issues/concerns per patient self-inventory: No. Other: n/a  New problem(s) identified: No, Describe:  No new problems identified  New Short Term/Long Term Goal(s):  Patient Goals:  "My thoughts are not clear right now so ask me this later"  Discharge Plan or Barriers: Tentative plan is for pt to return to her home with follow-up outpatient psychiatric services.  She is presently receiving medication management from her PCP.  Reason for Continuation of Hospitalization: Anxiety Depression Medication stabilization  Estimated Length of Stay: 3-5 days    Attendees: Patient: Lauren Warner 03/05/2018 12:52 PM  Physician: Orson Slick, MD 03/05/2018 12:52 PM  Nursing: Tyler Pita, RN 03/05/2018  12:52 PM  RN Care Manager: 03/05/2018 12:52 PM  Social Worker: Derrek Gu, LCSW 03/05/2018 12:52 PM  Recreational Therapist: Roanna Epley, LRT 03/05/2018 12:52 PM  Other: Darin Engels, [place] 03/05/2018 12:52 PM  Other:  03/05/2018 12:52 PM  Other: 03/05/2018 12:52 PM    Scribe for Treatment Team: Devona Konig, LCSW 03/05/2018 12:52 PM

## 2018-03-05 NOTE — Plan of Care (Signed)
Newly admitted. Cooperative and motivated for treatment

## 2018-03-05 NOTE — Progress Notes (Signed)
Patient woke up screaming, crying, reporting that someone was running after her. Was reoriented and informed that she is at the hospital, that she is safe. Patient calmed down and went back to sleep. Sitter continues to monitor.

## 2018-03-05 NOTE — Progress Notes (Signed)
0800  .Patient up in wheelchair with sitter.  0900 Patient appropriate behavior with 1:1.  1000 Safety maintained with sitter.  1200  Safety maintained with sitter.  1300 Patient appropriate behavior with 1:1.  1500 Safety maintained with sitter.  1700 Patient appropriate behavior with 1:1.

## 2018-03-06 LAB — PHOSPHORUS: Phosphorus: 6 mg/dL — ABNORMAL HIGH (ref 2.5–4.6)

## 2018-03-06 LAB — GLUCOSE, CAPILLARY
GLUCOSE-CAPILLARY: 195 mg/dL — AB (ref 70–99)
Glucose-Capillary: 174 mg/dL — ABNORMAL HIGH (ref 70–99)
Glucose-Capillary: 136 mg/dL — ABNORMAL HIGH (ref 70–99)

## 2018-03-06 MED ORDER — LIDOCAINE HCL (PF) 1 % IJ SOLN
5.0000 mL | INTRAMUSCULAR | Status: DC | PRN
  Filled 2018-03-06: qty 5

## 2018-03-06 MED ORDER — LIDOCAINE-PRILOCAINE 2.5-2.5 % EX CREA
1.0000 "application " | TOPICAL_CREAM | CUTANEOUS | Status: DC | PRN
  Filled 2018-03-06: qty 5

## 2018-03-06 MED ORDER — CHLORHEXIDINE GLUCONATE CLOTH 2 % EX PADS
6.0000 | MEDICATED_PAD | Freq: Every day | CUTANEOUS | Status: DC
  Administered 2018-03-07: 6 via TOPICAL

## 2018-03-06 MED ORDER — SODIUM CHLORIDE 0.9 % IV SOLN: 100.0000 mL | INTRAVENOUS | Status: DC | PRN

## 2018-03-06 MED ORDER — PENTAFLUOROPROP-TETRAFLUOROETH EX AERO
1.0000 "application " | INHALATION_SPRAY | CUTANEOUS | Status: DC | PRN
  Filled 2018-03-06: qty 30

## 2018-03-06 NOTE — [facility] Group Notes (Signed)
LCSW Group Therapy Note   03/06/2018 1:15pm   Type of Therapy and Topic:  Group Therapy:  Trust and Honesty  Participation Level:  Did Not Attend  Description of Group:    In this group patients will be asked to explore the value of being honest.  Patients will be guided to discuss their thoughts, feelings, and behaviors related to honesty and trusting in others. Patients will process together how trust and honesty relate to forming relationships with peers, family members, and self. Each patient will be challenged to identify and express feelings of being vulnerable. Patients will discuss reasons why people are dishonest and identify alternative outcomes if one was truthful (to self or others). This group will be process-oriented, with patients participating in exploration of their own experiences, giving and receiving support, and processing challenge from other group members.   Therapeutic Goals: 1. Patient will identify why honesty is important to relationships and how honesty overall affects relationships.  2. Patient will identify a situation where they lied or were lied too and the  feelings, thought process, and behaviors surrounding the situation 3. Patient will identify the meaning of being vulnerable, how that feels, and how that correlates to being honest with self and others. 4. Patient will identify situations where they could have told the truth, but instead lied and explain reasons of dishonesty.   Summary of Patient Progress:Pt was invited to attend group but chose not to attend. CSW will continue to encourage pt to attend group throughout their admission.     Therapeutic Modalities:   Cognitive Behavioral Therapy Solution Focused Therapy Motivational Interviewing Brief Therapy  Lauren Warner  CUEBAS-COLON, LCSW 03/06/2018 11:58 AM

## 2018-03-06 NOTE — Progress Notes (Signed)
Patient alert and oriented x 4, no distress noted, affect is flat and sad but brightens upon approach, patient's thoughts are organized and coherent, expressed anger that her attending MD feels she has dementia and she doesn't. Patient is currently on 1:1 . Safety sitter at bedside no distress noted interacting approximately, denies SI/HI/AVH, 15 minutes safety checks maintained will continue to monitor.

## 2018-03-06 NOTE — Progress Notes (Signed)
Hd completed 

## 2018-03-06 NOTE — Progress Notes (Signed)
Safety notes:  0800- Patient up in wheel chair eating her breakfast in the dayroom. Sitter at side, no distress noted. 0900-Patient lying in bed resting with eyes closed. Sitter at bedside. 1000- Patient lying in bed resting with her eyes closed. Sitter at bedside. 1100-Patient lying in bed with eyes closed. 1200- Patient up in wheelchair in med room pushed by her sitter, no distress. 1300- Patient in bed resting with eyes closed. Sitter at Bedside, no distress noted. 1400-Patient lying in bed awake concerned with when she will be going to dialysis. Sitter at bedside, no distress noted. 1500-Patient lying in bed awake, no distress noted. Sitter at bedside. 1600- Patient at dialysis with sitter at her side. 1700-Patient at dialysis with sitter at her side. 1800- Patient at dialysis with sitter at her side. 1900-Patient at dialysis with sitter at her side.

## 2018-03-06 NOTE — Plan of Care (Signed)
Patient alert and oriented to self and place, remains on 1:1 for safety. Continues to wear boot to L) foot for fracture, edema to area 2+ noted. Compliant with treatment plan, present in the milieu intermittently. Denies having thoughts of self harm. Will continue to  monitor for safety.

## 2018-03-06 NOTE — Progress Notes (Signed)
PT Cancellation Note  Patient Details Name: Lauren Warner MRN: [id number] DOB: [birth year]   Cancelled Treatment:    Reason Eval/Treat Not Completed: Other (comment). Pt without new lab values drawn this date. K+ remains outside of therapy norms to work with PT at this time (6.7). Of note, pt with plans for HD this date. May be able to re-assess next date if K+ are WNL. Per chart review, pt able to transfer from bed->WC however unable to walk due to inability to maintain NWB on L foot. Has boot applied. Will check back next date.   Nakyia Dau 03/06/2018, 10:12 AM  Greggory Stallion, PT, DPT [phone]

## 2018-03-06 NOTE — Progress Notes (Signed)
[facility] MD Progress Note  03/06/2018 3:30 PM Lauren Warner  MRN:  [id number] Subjective:  Pt seen and chart reviewed.  She is awake, alert, oriented to place and person. She reports feeling well, and doing ok. She said that she eats and sleeps well here, and no SI.  She doesn't seem to want to talk at all.   Sitter said that she was more awake earlier and was talkative as well, and coherent. No behavioral agitation.   PT refused to work with her today due to K+ at 6.7 on 03/04/18. Pt's HD is due today and will reassess Chem tomorrow.   Principal Problem: Major depressive disorder, recurrent severe without psychotic features ([place]) Diagnosis:   Patient Active Problem List   Diagnosis Date Noted  . MDD (major depressive disorder), single episode, severe , no psychosis ([place]) [F32.2]   . SOB (shortness of breath) [R06.02] 01/22/2018  . Respiratory failure ([place]) [J96.90] 01/21/2018  . Major depressive disorder, recurrent severe without psychotic features ([place]) [F33.2]   . Acute encephalopathy [G93.40] 01/07/2018  . Suicidal ideation [R45.851] 01/07/2018  . Hypertensive encephalopathy [I67.4] 12/23/2017  . Hypertensive urgency [I16.0] 12/22/2017  . Syncope and collapse [R55] 12/03/2017  . Hyperkalemia [E87.5] 11/10/2017  . Pressure injury of skin [L89.90] 10/20/2017  . Essential hypertension [I10] 10/19/2017  . CAD (coronary artery disease) [I25.10] 10/19/2017  . ESRD (end stage renal disease) ([place]) [N18.6] 10/19/2017  . Syncope [R55] 10/19/2017  . Anemia due to end stage renal disease ([place]) [N18.6, D63.1] 10/19/2017  . Closed left ankle fracture [S82.892A] 10/19/2017  . Nausea vomiting and diarrhea [R11.2, R19.7] 10/19/2017  . DM (diabetes mellitus), type 2 with renal complications ([place]) [N36.14] 10/19/2017  . Acute respiratory failure with hypoxia ([facility]) [J96.01] 10/18/2017   Total Time spent with patient: 15 minutes  Past Psychiatric History:  None before her daughter's death in 06-Oct-2017  Past Medical History:  Past Medical History:  Diagnosis Date  . Anemia   . Anxiety   . Asthma   . CHF (congestive heart failure) ([place])   . Coronary artery disease   . Daily headache   . Depression   . ESRD (end stage renal disease) on dialysis ([place])    "[facility]; TTS" (10/19/2017)  . High cholesterol   . History of blood transfusion 10/19/2017   "anemia"  . Hyperkalemia 10/2017  . Hypertension   . On home oxygen therapy    "2L; daily" (10/19/2017)  . Pneumonia    "several times" (10/19/2017)  . Pulmonary embolism ([place]) 2012  . Type 1 diabetes mellitus ([place])     Past Surgical History:  Procedure Laterality Date  . AV FISTULA PLACEMENT Left 2018  . CESAREAN SECTION  1989  . CORONARY ANGIOPLASTY WITH STENT PLACEMENT  ~ 2017-10-06   "3 stents" (10/19/2017)  . ENDOMETRIAL ABLATION  ~ 2011   Family History:  Family History  Problem Relation Age of Onset  . Stroke Sister    Family Psychiatric  History: unknown Social History:  Social History   Substance and Sexual Activity  Alcohol Use Never  . Frequency: Never     Social History   Substance and Sexual Activity  Drug Use Never    Social History   Socioeconomic History  . Marital status: Married    Spouse name: Not on file  . Number of children: Not on file  . Years of education: Not on file  . Highest education level: Not on file  Occupational History  . Not  on file  Social Needs  . Financial resource strain: Not on file  . Food insecurity:    Worry: Not on file    Inability: Not on file  . Transportation needs:    Medical: Not on file    Non-medical: Not on file  Tobacco Use  . Smoking status: Never Smoker  . Smokeless tobacco: Never Used  Substance and Sexual Activity  . Alcohol use: Never    Frequency: Never  . Drug use: Never  . Sexual activity: Not Currently  Lifestyle  . Physical activity:    Days per week: Not on file    Minutes per session: Not on file  . Stress: Not on file   Relationships  . Social connections:    Talks on phone: Not on file    Gets together: Not on file    Attends religious service: Not on file    Active member of club or organization: Not on file    Attends meetings of clubs or organizations: Not on file    Relationship status: Not on file  Other Topics Concern  . Not on file  Social History Narrative  . Not on file   Additional Social History:   Sleep: Fair  Appetite:  Fair  Current Medications: Current Facility-Administered Medications  Medication Dose Route Frequency Provider Last Rate Last Dose  . 0.9 %  sodium chloride infusion  100 mL Intravenous PRN Lateef, Munsoor, MD      . 0.9 %  sodium chloride infusion  100 mL Intravenous PRN Lateef, Munsoor, MD      . albuterol (PROVENTIL HFA;VENTOLIN HFA) 108 (90 Base) MCG/ACT inhaler 2 puff  2 puff Inhalation Q6H PRN Pucilowska, Jolanta B, MD      . alum & mag hydroxide-simeth (MAALOX/MYLANTA) 200-200-20 MG/5ML suspension 30 mL  30 mL Oral Q4H PRN Pucilowska, Jolanta B, MD      . amLODipine (NORVASC) tablet 5 mg  5 mg Oral Daily Pucilowska, Jolanta B, MD   5 mg at 03/06/18 0801  . atomoxetine (STRATTERA) capsule 40 mg  40 mg Oral Daily Pucilowska, Jolanta B, MD   40 mg at 03/06/18 0801  . calcium acetate (PHOSLO) capsule 667 mg  667 mg Oral TID WC Pucilowska, Jolanta B, MD   667 mg at 03/06/18 1149  . carvedilol (COREG) tablet 6.25 mg  6.25 mg Oral BID WC Pucilowska, Jolanta B, MD   6.25 mg at 03/06/18 0801  . [START ON 03/07/2018] Chlorhexidine Gluconate Cloth 2 % PADS 6 each  6 each Topical Q0600 Lateef, Munsoor, MD      . clopidogrel (PLAVIX) tablet 75 mg  75 mg Oral Daily Pucilowska, Jolanta B, MD   75 mg at 03/06/18 0801  . escitalopram (LEXAPRO) tablet 10 mg  10 mg Oral Daily Pucilowska, Jolanta B, MD   10 mg at 03/06/18 0801  . gabapentin (NEURONTIN) capsule 300 mg  300 mg Oral QHS Pucilowska, Jolanta B, MD   300 mg at 03/05/18 2210  . hydrOXYzine (ATARAX/VISTARIL) tablet 25 mg   25 mg Oral TID PRN Pucilowska, Jolanta B, MD      . insulin aspart (novoLOG) injection 0-9 Units  0-9 Units Subcutaneous TID WC Pucilowska, Jolanta B, MD   2 Units at 03/06/18 1148  . latanoprost (XALATAN) 0.005 % ophthalmic solution 1 drop  1 drop Both Eyes QHS Pucilowska, Jolanta B, MD   1 drop at 03/05/18 2211  . lidocaine (PF) (XYLOCAINE) 1 % injection 5 mL  5  mL Intradermal PRN Lateef, Munsoor, MD      . lidocaine-prilocaine (EMLA) cream 1 application  1 application Topical PRN Lateef, Munsoor, MD      . magnesium hydroxide (MILK OF MAGNESIA) suspension 30 mL  30 mL Oral Daily PRN Pucilowska, Jolanta B, MD      . multivitamin (RENA-VIT) tablet 1 tablet  1 tablet Oral QHS Pucilowska, Jolanta B, MD   1 tablet at 03/04/18 2230  . ondansetron (ZOFRAN-ODT) disintegrating tablet 4 mg  4 mg Oral Q8H PRN Pucilowska, Jolanta B, MD      . pentafluoroprop-tetrafluoroeth (GEBAUERS) aerosol 1 application  1 application Topical PRN Lateef, Munsoor, MD      . QUEtiapine (SEROQUEL) tablet 100 mg  100 mg Oral QHS Pucilowska, Jolanta B, MD   100 mg at 03/05/18 2211  . sevelamer carbonate (RENVELA) tablet 800 mg  800 mg Oral TID WC Pucilowska, Jolanta B, MD   800 mg at 03/06/18 1149  . traMADol (ULTRAM) tablet 50 mg  50 mg Oral Q6H PRN Pucilowska, Jolanta B, MD   50 mg at 03/06/18 1149  . traZODone (DESYREL) tablet 100 mg  100 mg Oral QHS PRN Pucilowska, Jolanta B, MD        Lab Results:  Results for orders placed or performed during the hospital encounter of 03/04/18 (from the past 48 hour(s))  Glucose, capillary     Status: Abnormal   Collection Time: 03/04/18  9:40 PM  Result Value Ref Range   Glucose-Capillary 190 (H) 70 - 99 mg/dL  Glucose, capillary     Status: Abnormal   Collection Time: 03/05/18  7:04 AM  Result Value Ref Range   Glucose-Capillary 151 (H) 70 - 99 mg/dL   Comment 1 Notify RN   Glucose, capillary     Status: Abnormal   Collection Time: 03/05/18 11:42 AM  Result Value Ref Range    Glucose-Capillary 160 (H) 70 - 99 mg/dL  Glucose, capillary     Status: Abnormal   Collection Time: 03/05/18  5:07 PM  Result Value Ref Range   Glucose-Capillary 215 (H) 70 - 99 mg/dL  Glucose, capillary     Status: Abnormal   Collection Time: 03/06/18  7:11 AM  Result Value Ref Range   Glucose-Capillary 174 (H) 70 - 99 mg/dL   Comment 1 Notify RN   Glucose, capillary     Status: Abnormal   Collection Time: 03/06/18 11:41 AM  Result Value Ref Range   Glucose-Capillary 195 (H) 70 - 99 mg/dL   Comment 1 Notify RN     Blood Alcohol level:  Lab Results  Component Value Date   ETH <10 03/03/2018   ETH <10 67/34/1937    Metabolic Disorder Labs: Lab Results  Component Value Date   HGBA1C 8.0 (H) 01/21/2018   MPG 182.9 01/21/2018   MPG 168.55 11/10/2017   No results found for: PROLACTIN No results found for: CHOL, TRIG, HDL, CHOLHDL, VLDL, LDLCALC  Physical Findings: AIMS: Facial and Oral Movements Muscles of Facial Expression: None, normal Lips and Perioral Area: None, normal Jaw: None, normal Tongue: None, normal,Extremity Movements Upper (arms, wrists, hands, fingers): None, normal Lower (legs, knees, ankles, toes): None, normal, Trunk Movements Neck, shoulders, hips: None, normal, Overall Severity Severity of abnormal movements (highest score from questions above): None, normal Incapacitation due to abnormal movements: None, normal Patient's awareness of abnormal movements (rate only patient's report): No Awareness, Dental Status Current problems with teeth and/or dentures?: No Does patient usually wear dentures?:  No  CIWA:    COWS:     Musculoskeletal: Strength & Muscle Tone: within normal limits Gait & Station: normal Patient leans: N/A  Psychiatric Specialty Exam: Physical Exam  ROS  Blood pressure 128/78, pulse 72, temperature 98.5 F (36.9 C), temperature source Oral, resp. rate 16, height 5\' 7"  (1.702 m), weight 94.8 kg, SpO2 100 %.Body mass index is  32.73 kg/m.  General Appearance: Casual  Eye Contact:  Fair  Speech:  Clear and Coherent  Volume:  Normal  Mood:  Depressed  Affect:  Flat  Thought Process:  Linear  Orientation:  Full (Time, Place, and Person)  Thought Content:  Logical  Suicidal Thoughts:  No  Homicidal Thoughts:  No  Memory:  NA  Judgement:  Fair  Insight:  Fair  Psychomotor Activity:  Decreased  Concentration:  Concentration: NA and Attention Span: NA  Recall:  NA  Fund of Knowledge:  NA  Language:  Fair        Assets:  Desire for Improvement  ADL's:  Intact  Cognition:  Impaired,  Mild  Sleep:  Number of Hours: 8.15     Treatment Plan Summary: Daily contact with patient to assess and evaluate symptoms and progress in treatment and Medication management   Ms. Peltz is a 52 year old female with ESRD on dialysis and depression admitted for suicidal ideation in the context of severe social stressors. Her presentation is not at all clear. Bipolar hypomanic or mixed episode should be considered. Also dementia (pseudodementia, frontal lobe).  #Suicidal ideation -patient able to contract for safety in the hospital  #Mood -continue Lexapro 10 mg daily -continue Seroquel 100 mg nightly (this medicine can worsen her BG).  -Trazodone 100 mg prn  #Cognitive decline -head CT scan: negative  #ESRD -nephrology expertise is greatly appreciated -next dialysis on today, Dr. Zollie Scale aware.  K+ is >6 will recheck after HD today.   #HTN -Norvasc 5 mg daily -Coreg 6.25 mg daily  #DM -ADA diet, SSI -her BG is NOT well controlled. Consider Hospitalist consult [phone])   #Fractured foot -appointment with [facility] on 10/11  #Falls, patient reportedly not allowed to bear weight, wears a boot -1:1 sitter, will reevaluate per shift -wheelchair available -PT consult not completed due to elevated potassium level, will recheck after today's HD  #Labs -lipid panel,  -TSH is WNL  - A1C is 8  in Aug.  -EKG, QTc is 471  #Disposition -patient will be homeless on 10/10  Andron Marrazzo, MD 03/06/2018, 3:30 PM

## 2018-03-06 NOTE — Plan of Care (Signed)
Patient alert and oriented to person, place and situation. For safety reasons, patient is on 1:1 sitter. When up out of bed patient ambulated the unit via the wheelchair. Endorses some depression and verbally denies SI/HI/AVH at this time. States, "No I don't feel like I want to hurt myself." Milieu remains safe with q 15 minute safety checks. Will continue to monitor.

## 2018-03-06 NOTE — Progress Notes (Signed)
Blood sugar monitoring not done at 1700, patient was at dialysis.

## 2018-03-06 NOTE — Progress Notes (Signed)
Hd started  

## 2018-03-06 NOTE — Plan of Care (Signed)
  Problem: Education: Goal: Utilization of techniques to improve thought processes will improve Outcome: Progressing  Patient's thought process improving

## 2018-03-07 LAB — BASIC METABOLIC PANEL
Anion gap: 14 (ref 5–15)
BUN: 36 mg/dL — ABNORMAL HIGH (ref 6–20)
CHLORIDE: 94 mmol/L — AB (ref 98–111)
CO2: 28 mmol/L (ref 22–32)
Calcium: 8.6 mg/dL — ABNORMAL LOW (ref 8.9–10.3)
Creatinine, Ser: 6.75 mg/dL — ABNORMAL HIGH (ref 0.44–1.00)
GFR calc non Af Amer: 6 mL/min — ABNORMAL LOW (ref >60)
GFR, EST AFRICAN AMERICAN: 7 mL/min — AB (ref >60)
GLUCOSE: 150 mg/dL — AB (ref 70–99)
Potassium: 4.7 mmol/L (ref 3.5–5.1)
Sodium: 136 mmol/L (ref 135–145)

## 2018-03-07 LAB — GLUCOSE, CAPILLARY
GLUCOSE-CAPILLARY: 213 mg/dL — AB (ref 70–99)
GLUCOSE-CAPILLARY: 223 mg/dL — AB (ref 70–99)
Glucose-Capillary: 143 mg/dL — ABNORMAL HIGH (ref 70–99)
Glucose-Capillary: 131 mg/dL — ABNORMAL HIGH (ref 70–99)

## 2018-03-07 MED ORDER — GLUCERNA SHAKE PO LIQD
237.0000 mL | Freq: Three times a day (TID) | ORAL | Status: DC
  Administered 2018-03-07 – 2018-03-08 (×3): 237 mL via ORAL

## 2018-03-07 NOTE — [facility] Group Notes (Signed)
[facility] Group Notes:  (Nursing/MHT/Case Management/Adjunct)  Date:  03/07/2018  Time:  9:54 PM  Type of Therapy:  Group Therapy  Participation Level:  Did Not Attend   Barnie Mort 03/07/2018, 9:54 PM

## 2018-03-07 NOTE — Progress Notes (Signed)
[facility] MD Progress Note  03/07/2018 1:35 PM Lauren Warner  MRN:  [id number] Subjective:  Pt seen and chart reviewed.  She reports feeling better after HD, and her K is WNL, so PT came to work with her this morning as well. She was a bit upset about the suspicion about her cognitive ability, stated she understands everything and "I am not demented or anything".    Regarding her housing situation, she said that she has court date for eviction on Tuesday. She hopes that the judge would let her pay part of the rent and postpone the eviction. She believes that our SW told her that she can get help from [facility] to pay her rent, which I am not sure if it is true.  She is very hopeful about that!   No SI now. Cognitively, she does seem to be much better.    Principal Problem: Major depressive disorder, recurrent severe without psychotic features ([place]) Diagnosis:   Patient Active Problem List   Diagnosis Date Noted  . MDD (major depressive disorder), single episode, severe , no psychosis ([place]) [F32.2]   . SOB (shortness of breath) [R06.02] 01/22/2018  . Respiratory failure ([place]) [J96.90] 01/21/2018  . Major depressive disorder, recurrent severe without psychotic features ([place]) [F33.2]   . Acute encephalopathy [G93.40] 01/07/2018  . Suicidal ideation [R45.851] 01/07/2018  . Hypertensive encephalopathy [I67.4] 12/23/2017  . Hypertensive urgency [I16.0] 12/22/2017  . Syncope and collapse [R55] 12/03/2017  . Hyperkalemia [E87.5] 11/10/2017  . Pressure injury of skin [L89.90] 10/20/2017  . Essential hypertension [I10] 10/19/2017  . CAD (coronary artery disease) [I25.10] 10/19/2017  . ESRD (end stage renal disease) ([place]) [N18.6] 10/19/2017  . Syncope [R55] 10/19/2017  . Anemia due to end stage renal disease ([place]) [N18.6, D63.1] 10/19/2017  . Closed left ankle fracture [S82.892A] 10/19/2017  . Nausea vomiting and diarrhea [R11.2, R19.7] 10/19/2017  . DM (diabetes mellitus), type 2 with renal  complications ([place]) [O27.74] 10/19/2017  . Acute respiratory failure with hypoxia ([facility]) [J96.01] 10/18/2017   Total Time spent with patient: 20 minutes  Past Psychiatric History:  None before her daughter's death in 10/27/2017  Past Medical History:  Past Medical History:  Diagnosis Date  . Anemia   . Anxiety   . Asthma   . CHF (congestive heart failure) ([place])   . Coronary artery disease   . Daily headache   . Depression   . ESRD (end stage renal disease) on dialysis ([place])    "[facility]; TTS" (10/19/2017)  . High cholesterol   . History of blood transfusion 10/19/2017   "anemia"  . Hyperkalemia 10/2017  . Hypertension   . On home oxygen therapy    "2L; daily" (10/19/2017)  . Pneumonia    "several times" (10/19/2017)  . Pulmonary embolism ([place]) 2012  . Type 1 diabetes mellitus ([place])     Past Surgical History:  Procedure Laterality Date  . AV FISTULA PLACEMENT Left 2018  . CESAREAN SECTION  1989  . CORONARY ANGIOPLASTY WITH STENT PLACEMENT  ~ 2017/10/27   "3 stents" (10/19/2017)  . ENDOMETRIAL ABLATION  ~ 2011   Family History:  Family History  Problem Relation Age of Onset  . Stroke Sister    Family Psychiatric  History: unknown Social History:  Social History   Substance and Sexual Activity  Alcohol Use Never  . Frequency: Never     Social History   Substance and Sexual Activity  Drug Use Never    Social History   Socioeconomic  History  . Marital status: Married    Spouse name: Not on file  . Number of children: Not on file  . Years of education: Not on file  . Highest education level: Not on file  Occupational History  . Not on file  Social Needs  . Financial resource strain: Not on file  . Food insecurity:    Worry: Not on file    Inability: Not on file  . Transportation needs:    Medical: Not on file    Non-medical: Not on file  Tobacco Use  . Smoking status: Never Smoker  . Smokeless tobacco: Never Used  Substance and Sexual Activity  .  Alcohol use: Never    Frequency: Never  . Drug use: Never  . Sexual activity: Not Currently  Lifestyle  . Physical activity:    Days per week: Not on file    Minutes per session: Not on file  . Stress: Not on file  Relationships  . Social connections:    Talks on phone: Not on file    Gets together: Not on file    Attends religious service: Not on file    Active member of club or organization: Not on file    Attends meetings of clubs or organizations: Not on file    Relationship status: Not on file  Other Topics Concern  . Not on file  Social History Narrative  . Not on file   Additional Social History:   Sleep: Fair  Appetite:  Fair  Current Medications: Current Facility-Administered Medications  Medication Dose Route Frequency Provider Last Rate Last Dose  . 0.9 %  sodium chloride infusion  100 mL Intravenous PRN Lateef, Munsoor, MD      . 0.9 %  sodium chloride infusion  100 mL Intravenous PRN Lateef, Munsoor, MD      . albuterol (PROVENTIL HFA;VENTOLIN HFA) 108 (90 Base) MCG/ACT inhaler 2 puff  2 puff Inhalation Q6H PRN Pucilowska, Jolanta B, MD      . alum & mag hydroxide-simeth (MAALOX/MYLANTA) 200-200-20 MG/5ML suspension 30 mL  30 mL Oral Q4H PRN Pucilowska, Jolanta B, MD      . amLODipine (NORVASC) tablet 5 mg  5 mg Oral Daily Pucilowska, Jolanta B, MD   5 mg at 03/07/18 0735  . atomoxetine (STRATTERA) capsule 40 mg  40 mg Oral Daily Pucilowska, Jolanta B, MD   40 mg at 03/07/18 0735  . calcium acetate (PHOSLO) capsule 667 mg  667 mg Oral TID WC Pucilowska, Jolanta B, MD   667 mg at 03/07/18 1135  . carvedilol (COREG) tablet 6.25 mg  6.25 mg Oral BID WC Pucilowska, Jolanta B, MD   6.25 mg at 03/07/18 0735  . Chlorhexidine Gluconate Cloth 2 % PADS 6 each  6 each Topical Q0600 Anthonette Legato, MD   6 each at 03/07/18 0741  . clopidogrel (PLAVIX) tablet 75 mg  75 mg Oral Daily Pucilowska, Jolanta B, MD   75 mg at 03/07/18 0735  . escitalopram (LEXAPRO) tablet 10 mg  10  mg Oral Daily Pucilowska, Jolanta B, MD   10 mg at 03/07/18 0735  . gabapentin (NEURONTIN) capsule 300 mg  300 mg Oral QHS Pucilowska, Jolanta B, MD   300 mg at 03/06/18 2115  . hydrOXYzine (ATARAX/VISTARIL) tablet 25 mg  25 mg Oral TID PRN Pucilowska, Jolanta B, MD      . insulin aspart (novoLOG) injection 0-9 Units  0-9 Units Subcutaneous TID WC Pucilowska, Jolanta B, MD  3 Units at 03/07/18 1136  . latanoprost (XALATAN) 0.005 % ophthalmic solution 1 drop  1 drop Both Eyes QHS Pucilowska, Jolanta B, MD   1 drop at 03/06/18 2116  . lidocaine (PF) (XYLOCAINE) 1 % injection 5 mL  5 mL Intradermal PRN Lateef, Munsoor, MD      . lidocaine-prilocaine (EMLA) cream 1 application  1 application Topical PRN Lateef, Munsoor, MD      . magnesium hydroxide (MILK OF MAGNESIA) suspension 30 mL  30 mL Oral Daily PRN Pucilowska, Jolanta B, MD      . multivitamin (RENA-VIT) tablet 1 tablet  1 tablet Oral QHS Pucilowska, Jolanta B, MD   1 tablet at 03/06/18 2116  . ondansetron (ZOFRAN-ODT) disintegrating tablet 4 mg  4 mg Oral Q8H PRN Pucilowska, Jolanta B, MD      . pentafluoroprop-tetrafluoroeth (GEBAUERS) aerosol 1 application  1 application Topical PRN Lateef, Munsoor, MD      . QUEtiapine (SEROQUEL) tablet 100 mg  100 mg Oral QHS Pucilowska, Jolanta B, MD   100 mg at 03/06/18 2115  . sevelamer carbonate (RENVELA) tablet 800 mg  800 mg Oral TID WC Pucilowska, Jolanta B, MD   800 mg at 03/07/18 1225  . traMADol (ULTRAM) tablet 50 mg  50 mg Oral Q6H PRN Pucilowska, Jolanta B, MD   50 mg at 03/06/18 1149  . traZODone (DESYREL) tablet 100 mg  100 mg Oral QHS PRN Pucilowska, Jolanta B, MD   100 mg at 03/06/18 2115    Lab Results:  Results for orders placed or performed during the hospital encounter of 03/04/18 (from the past 48 hour(s))  Glucose, capillary     Status: Abnormal   Collection Time: 03/05/18  5:07 PM  Result Value Ref Range   Glucose-Capillary 215 (H) 70 - 99 mg/dL  Glucose, capillary     Status:  Abnormal   Collection Time: 03/06/18  7:11 AM  Result Value Ref Range   Glucose-Capillary 174 (H) 70 - 99 mg/dL   Comment 1 Notify RN   Glucose, capillary     Status: Abnormal   Collection Time: 03/06/18 11:41 AM  Result Value Ref Range   Glucose-Capillary 195 (H) 70 - 99 mg/dL   Comment 1 Notify RN   Phosphorus     Status: Abnormal   Collection Time: 03/06/18  3:47 PM  Result Value Ref Range   Phosphorus 6.0 (H) 2.5 - 4.6 mg/dL    Comment: Performed at [facility], [address] [place], [place] [address]  Glucose, capillary     Status: Abnormal   Collection Time: 03/06/18  8:28 PM  Result Value Ref Range   Glucose-Capillary 136 (H) 70 - 99 mg/dL   Comment 1 Notify RN   Basic metabolic panel     Status: Abnormal   Collection Time: 03/07/18  6:25 AM  Result Value Ref Range   Sodium 136 135 - 145 mmol/L   Potassium 4.7 3.5 - 5.1 mmol/L   Chloride 94 (L) 98 - 111 mmol/L   CO2 28 22 - 32 mmol/L   Glucose, Bld 150 (H) 70 - 99 mg/dL   BUN 36 (H) 6 - 20 mg/dL   Creatinine, Ser 6.75 (H) 0.44 - 1.00 mg/dL   Calcium 8.6 (L) 8.9 - 10.3 mg/dL   GFR calc non Af Amer 6 (L) >60 mL/min   GFR calc Af Amer 7 (L) >60 mL/min    Comment: (NOTE) The eGFR has been calculated using the CKD EPI  equation. This calculation has not been validated in all clinical situations. eGFR's persistently <60 mL/min signify possible Chronic Kidney Disease.    Anion gap 14 5 - 15    Comment: Performed at [facility], [address] [place], [place] [address]  Glucose, capillary     Status: Abnormal   Collection Time: 03/07/18  6:59 AM  Result Value Ref Range   Glucose-Capillary 143 (H) 70 - 99 mg/dL   Comment 1 Notify RN   Glucose, capillary     Status: Abnormal   Collection Time: 03/07/18 11:06 AM  Result Value Ref Range   Glucose-Capillary 213 (H) 70 - 99 mg/dL    Blood Alcohol level:  Lab Results  Component Value Date   ETH <10 03/03/2018   ETH <10 99/37/1696     Metabolic Disorder Labs: Lab Results  Component Value Date   HGBA1C 8.0 (H) 01/21/2018   MPG 182.9 01/21/2018   MPG 168.55 11/10/2017   No results found for: PROLACTIN No results found for: CHOL, TRIG, HDL, CHOLHDL, VLDL, LDLCALC  Physical Findings: AIMS: Facial and Oral Movements Muscles of Facial Expression: None, normal Lips and Perioral Area: None, normal Jaw: None, normal Tongue: None, normal,Extremity Movements Upper (arms, wrists, hands, fingers): None, normal Lower (legs, knees, ankles, toes): None, normal, Trunk Movements Neck, shoulders, hips: None, normal, Overall Severity Severity of abnormal movements (highest score from questions above): None, normal Incapacitation due to abnormal movements: None, normal Patient's awareness of abnormal movements (rate only patient's report): No Awareness, Dental Status Current problems with teeth and/or dentures?: No Does patient usually wear dentures?: No  CIWA:    COWS:     Musculoskeletal: Strength & Muscle Tone: within normal limits Gait & Station: normal Patient leans: N/A  Psychiatric Specialty Exam: Physical Exam   ROS   Blood pressure (!) 149/71, pulse 72, temperature 98.4 F (36.9 C), temperature source Oral, resp. rate 18, height 5' 7" (1.702 m), weight 94.4 kg, SpO2 99 %.Body mass index is 32.6 kg/m.  General Appearance: Casual  Eye Contact:  Fair  Speech:  Clear and Coherent  Volume:  Normal  Mood:  Depressed  Affect:  Flat  Thought Process:  Linear  Orientation:  Full (Time, Place, and Person)  Thought Content:  Logical  Suicidal Thoughts:  No  Homicidal Thoughts:  No  Memory:  NA  Judgement:  Fair  Insight:  Fair  Psychomotor Activity:  Decreased  Concentration:  Concentration: NA and Attention Span: NA  Recall:  NA  Fund of Knowledge:  NA  Language:  Fair        Assets:  Desire for Improvement  ADL's:  Intact  Cognition:  Impaired,  Mild  Sleep:  Number of Hours: 8.25      Treatment Plan Summary: Daily contact with patient to assess and evaluate symptoms and progress in treatment and Medication management   Ms. Patane is a 52 year old female with ESRD on dialysis and depression admitted for suicidal ideation in the context of severe social stressors. Her presentation is not at all clear. Bipolar hypomanic or mixed episode should be considered. Also suspected dementia (pseudodementia, frontal lobe), CT is negative, her mental status seems to be improved after HD.  #Suicidal ideation -patient able to contract for safety in the hospital  #Mood -continue Lexapro 10 mg daily -continue Seroquel 100 mg nightly (this medicine can worsen her BG).  -Trazodone 100 mg prn  #Cognitive decline -head CT scan: negative - her cognition seems to be improved  after HD.   #ESRD -nephrology expertise is greatly appreciated - had dialysis yesterday, Dr. Zollie Scale aware.  K+ is WNL today, and PT came to work with her this AM as well.   #HTN -Norvasc 5 mg daily -Coreg 6.25 mg daily  #DM -ADA diet, SSI -her BG is NOT well controlled. Consider Hospitalist consult [phone])   #Fractured foot -appointment with [facility] on 10/11  #Falls, patient reportedly not allowed to bear weight, wears a boot -1:1 sitter, will reevaluate per shift -wheelchair available -PT came to work with her this AM after her K is WNL>   #Labs -lipid panel,  -TSH is WNL  - A1C is 8 in Aug.  -EKG, QTc is 471  #Disposition -patient will likely be homeless on 10/10  Aman Bonet, MD 03/07/2018, 1:35 PM

## 2018-03-07 NOTE — Plan of Care (Addendum)
Patient found in bed sleeping upon my arrival. Patient is neither visible nor social this evening. Mood and affect is depressed. Patient is tearful at one point but it appears manipulative in nature as it stopped as soon as she was told she could have pain medication. Patient is compliant with 1:1 sitter. Patient reports being unable to perform hygiene or walk on her own. Complains of back pain 7/10, given Tramadol with positive results. Patient is eating adequately. Compliant with HS medications. Given Trazodone for sleep with positive results. Did not attend group. Q 15 minute checks maintained. Will continue to monitor throughout the shift. Patient slept 7.75 hours. No apparent distress. Will endorse care to oncoming shift.  Problem: Education: Goal: Verbalization of understanding the information provided will improve Outcome: Progressing   Problem: Education: Goal: Knowledge of the prescribed therapeutic regimen will improve Outcome: Progressing   Problem: Coping: Goal: Coping ability will improve Outcome: Progressing   Problem: Safety: Goal: Periods of time without injury will increase Outcome: Progressing   Problem: Activity: Goal: Interest or engagement in activities will improve Outcome: Not Progressing

## 2018-03-07 NOTE — Plan of Care (Signed)
Data: Patient is appropriate and cooperative to assessment. Patient denies SI/HI/AVH. Patient has completed daily self inventory worksheet. Patient is bright and smiling during assessment. Patient has complaints of depression and anxiety and a pain rating of 0/10. Patient reports fair sleep quality, appetite is good for the last 24 hours. 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 Patient was provided with education on medications. Patient was offered support and encouragement. Patient was given scheduled medications. Patient  was encourage to attend groups, participate in unit activities and continue with plan of care.    Response: Patient is compliant with scheduled medications. Patient is receptive to treatment and safety maintained on unit.    Problem: Education: Goal: Knowledge of [facility] Education information/materials will improve Outcome: Progressing Goal: Emotional status will improve Outcome: Progressing Goal: Mental status will improve Outcome: Progressing

## 2018-03-07 NOTE — [facility] Group Notes (Signed)
LCSW Group Therapy Note 03/07/2018 1:15pm  Type of Therapy and Topic: Group Therapy: Feelings Around Returning Home & Establishing a Supportive Framework and Supporting Oneself When Supports Not Available  Participation Level: Active  Description of Group:  Patients first processed thoughts and feelings about upcoming discharge. These included fears of upcoming changes, lack of change, new living environments, judgements and expectations from others and overall stigma of mental health issues. The group then discussed the definition of a supportive framework, what that looks and feels like, and how do to discern it from an unhealthy non-supportive network. The group identified different types of supports as well as what to do when your family/friends are less than helpful or unavailable  Therapeutic Goals  1. Patient will identify one healthy supportive network that they can use at discharge. 2. Patient will identify one factor of a supportive framework and how to tell it from an unhealthy network. 3. Patient able to identify one coping skill to use when they do not have positive supports from others. 4. Patient will demonstrate ability to communicate their needs through discussion and/or role plays.  Summary of Patient Progress:  The patient reported she feels "good." Pt engaged during group session. As patients processed their anxiety about discharge and described healthy supports patient shared she is ready to be discharge. She states, "in my mind I didn't think things were going to be this way. I thought I was going to talk to someone to help my deal with grief as I lost my daughter this past April." The patient listed the church as her main support.  Patients identified at least one self-care tool they were willing to use after discharge; drawing, writing.   Therapeutic Modalities Cognitive Behavioral Therapy Motivational Interviewing   Brienne Liguori  CUEBAS-COLON, LCSW 03/07/2018 11:41 AM

## 2018-03-07 NOTE — Plan of Care (Signed)
  Problem: Education: Goal: Knowledge of [facility] Education information/materials will improve Outcome: Progressing Goal: Emotional status will improve Outcome: Progressing Goal: Mental status will improve Outcome: Progressing Goal: Verbalization of understanding the information provided will improve Outcome: Progressing   Problem: Education: Goal: Utilization of techniques to improve thought processes will improve Outcome: Progressing Goal: Knowledge of the prescribed therapeutic regimen will improve Outcome: Progressing   Problem: Activity: Goal: Interest or engagement in leisure activities will improve Outcome: Progressing   Problem: Coping: Goal: Coping ability will improve Outcome: Progressing Goal: Will verbalize feelings Outcome: Progressing   Problem: Activity: Goal: Interest or engagement in activities will improve Outcome: Progressing   Problem: Health Behavior/Discharge Planning: Goal: Compliance with treatment plan for underlying cause of condition will improve Outcome: Progressing   Problem: Safety: Goal: Periods of time without injury will increase Outcome: Progressing
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
## 2018-03-07 NOTE — Progress Notes (Signed)
Physical Therapy Evaluation Patient Details Name: Lauren Warner MRN: [id number] DOB: Apr 01, 1966 Today's Date: 03/07/2018   History of Present Illness  Pt has hx of end-stage renal disease , CHF, Type I DM, CAD, on home oxygen 2 L at baseline, HTN, history of PE who presents to the ED on 03/03/2018 via EMS , after missing hemodialysis on 03/02/2018. She complained of SOB and chest pain. SpO2 was 78% on room air. She was placed on BiPAP and sent to inpatient hemodialysis for emergent dialysis. Transfered to [facility] on 03/04/2018 with suicidal ideation. Pt wears Cam Walker on L foot, per pt she is NWB.  Clinical Impression  Pt is a very pleasant 52 year old female who was admitted for major depressive disorder, recurrent severe without psychotic features. Pt performs bed mobility independently, sit-to-stand transfers with Mod A and RW, squat pivot transfers independently with VC for safety, clear LLE WBing present during this transfer. PT educated pt on better mechanics to maintain NWB precautions at this time, after education is able to perform safely with supervision. Pt unable to ambulate this date. Pt also educated on HEP that includes ther-ex for LE strengthening. Pt demonstrates deficits with LE strength, standing balance, and mobility. Pt is not at her baseline. Would benefit from skilled PT to address above deficits and promote optimal return to PLOF.      Follow Up Recommendations SNF    Equipment Recommendations  Rolling walker with 5" wheels    Recommendations for Other Services       Precautions / Restrictions Precautions Precautions: Fall Restrictions Weight Bearing Restrictions: Yes LLE Weight Bearing: Non weight bearing      Mobility  Bed Mobility Overal bed mobility: Independent             General bed mobility comments: Pt performs safe bed mobility  Transfers Overall transfer level: Needs assistance Equipment used: Rolling walker (2  wheeled) Transfers: Sit to/from [employer] Transfers Sit to Stand: From elevated surface;Mod assist   Squat pivot transfers: Supervision     General transfer comment: Pt performed sit-to-stand with good anterior translation, R trunk lean noted, pt uses RW for UE support in standing. Pt reports dizziness with standing, decreased but required sitting to completely resolve. VC for sequencing for safety. Pt performs transfer from bed to chair, demonstrate good UE strength, VC required to move chair to safe and efficient position.  Ambulation/Gait             General Gait Details: Did not progress gait d/t dizziness sx when standing.  Stairs            Wheelchair Mobility    Modified Rankin (Stroke Patients Only)       Balance Overall balance assessment: Needs assistance;Independent Sitting-balance support: No upper extremity supported;Feet unsupported Sitting balance-Leahy Scale: Normal Sitting balance - Comments: Pt demostrates ability to tolerate mod-max weight shift without LOB.   Standing balance support: Bilateral upper extremity supported Standing balance-Leahy Scale: Poor Standing balance comment: Pt uses RW for UE support and WB's only in RLE. LLE is touchdown and anterior to RLE.                             Pertinent Vitals/Pain Pain Assessment: Faces Faces Pain Scale: Hurts a little bit Pain Location: L ankle Pain Descriptors / Indicators: Guarding Pain Intervention(s): Monitored during session    Home Living Family/patient expects to be discharged to:: Private residence  Living Arrangements: Alone   Type of Home: Apartment Home Access: Level entry     Home Layout: One level Home Equipment: [place] - 4 wheels;Shower seat      Prior Function Level of Independence: Independent         Comments: States ADL are difficult especially on dialysis days due to fatigue     Hand Dominance   Dominant Hand: Right    Extremity/Trunk  Assessment   Upper Extremity Assessment Upper Extremity Assessment: Overall WFL for tasks assessed    Lower Extremity Assessment Lower Extremity Assessment: Generalized weakness(RLE 4/5, LLE 3/5)       Communication   Communication: No difficulties  Cognition Arousal/Alertness: Awake/alert Behavior During Therapy: WFL for tasks assessed/performed Overall Cognitive Status: Within Functional Limits for tasks assessed                                 General Comments: Patient consistantly follows commands      General Comments      Exercises Other Exercises Other Exercises: Seated at EOB ankle pumps, LAQs, marching, and hip ABD/ADD. All ther-ex performed 10 reps with VC for technique.   Assessment/Plan    PT Assessment Patient needs continued PT services  PT Problem List Decreased strength;Decreased mobility;Decreased balance;Pain;Obesity       PT Treatment Interventions DME instruction;Gait training;Functional mobility training;Therapeutic activities;Therapeutic exercise;Balance training;Neuromuscular re-education;Patient/family education    PT Goals (Current goals can be found in the Care Plan section)  Acute Rehab PT Goals Patient Stated Goal: To be able to use my left leg. PT Goal Formulation: With patient Time For Goal Achievement: 03/21/18 Potential to Achieve Goals: Fair    Frequency Min 2X/week   Barriers to discharge Decreased caregiver support      Co-evaluation               AM-PAC PT "6 Clicks" Daily Activity  Outcome Measure Difficulty turning over in bed (including adjusting bedclothes, sheets and blankets)?: None Difficulty moving from lying on back to sitting on the side of the bed? : None Difficulty sitting down on and standing up from a chair with arms (e.g., wheelchair, bedside commode, etc,.)?: Unable Help needed moving to and from a bed to chair (including a wheelchair)?: None Help needed walking in hospital room?:  Total Help needed climbing 3-5 steps with a railing? : Total 6 Click Score: 15    End of Session Equipment Utilized During Treatment: Gait belt Activity Tolerance: Patient tolerated treatment well Patient left: Other (comment);with nursing/sitter in room(In transport chair for lunch.) Nurse Communication: Mobility status PT Visit Diagnosis: Unsteadiness on feet (R26.81);Muscle weakness (generalized) (M62.81);History of falling (Z91.81);Pain Pain - Right/Left: Left Pain - part of body: Ankle and joints of foot    Time: 1106-1130 PT Time Calculation (min) (ACUTE ONLY): 24 min   Charges:              Algis Downs, SPT   Algis Downs 03/07/2018, 3:07 PM

## 2018-03-08 LAB — GLUCOSE, CAPILLARY
GLUCOSE-CAPILLARY: 180 mg/dL — AB (ref 70–99)
Glucose-Capillary: 167 mg/dL — ABNORMAL HIGH (ref 70–99)
Glucose-Capillary: 162 mg/dL — ABNORMAL HIGH (ref 70–99)

## 2018-03-08 LAB — PARATHYROID HORMONE, INTACT (NO CA): PTH: 216 pg/mL — ABNORMAL HIGH (ref 15–65)

## 2018-03-08 LAB — HEPATITIS B SURFACE ANTIGEN: Hepatitis B Surface Ag: NEGATIVE

## 2018-03-08 MED ORDER — HYDROXYZINE HCL 25 MG PO TABS: 25.0000 mg | ORAL_TABLET | Freq: Three times a day (TID) | ORAL | 1 refills | Status: DC | PRN

## 2018-03-08 MED ORDER — QUETIAPINE FUMARATE 100 MG PO TABS: 100.0000 mg | ORAL_TABLET | Freq: Every day | ORAL | 1 refills | Status: DC

## 2018-03-08 MED ORDER — TRAZODONE HCL 100 MG PO TABS: 100.0000 mg | ORAL_TABLET | Freq: Every evening | ORAL | 1 refills | Status: DC | PRN
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
## 2018-03-08 NOTE — [facility] Suicide Risk Assessment (Signed)
[facility] INPATIENT:  Family/Significant Other Suicide Prevention Education  Suicide Prevention Education:   Patient Refusal for Family/Significant Other Suicide Prevention Education: The patient Lauren Warner has refused to provide written consent for family/significant other to be provided Family/Significant Other Suicide Prevention Education during admission and/or prior to discharge.  Physician notified.  Carloyn Jaeger Xaviar Lunn, LCSW 03/08/2018, 4:47 PM

## 2018-03-08 NOTE — Progress Notes (Signed)
  [facility] Adult Case Management Discharge Plan :  Will you be returning to the same living situation after discharge:  Yes,    At discharge, do you have transportation home?: No. Do you have the ability to pay for your medications: Yes,     Release of information consent forms completed and in the chart;  Patient's signature needed at discharge.  Patient to Follow up at: Follow-up Information    [facility] on 03/10/2018.   Why:  8:00am for Hospital Follow up appointment Contact information: [place] [address] [phone]           Next level of care provider has access to [facility] and Suicide Prevention discussed: Yes,     Have you used any form of tobacco in the last 30 days? (Cigarettes, Smokeless Tobacco, Cigars, and/or Pipes): No  Has patient been referred to the Quitline?: N/A patient is not a smoker  Patient has been referred for addiction treatment: Yes  August Saucer, LCSW 03/08/2018, 4:46 PM

## 2018-03-08 NOTE — Progress Notes (Signed)
Recreation Therapy Notes  Date: 03/08/2018  Time: 9:30 am  Location: Craft Room  Behavioral response: Appropriate   Intervention Topic: Self-Esteem  Discussion/Intervention:  Group content today was focused on self-esteem. Patient defined self-esteem and where it comes form. The group described reasons self-esteem is important. Individuals stated things that impact self-esteem and positive ways to improve self-esteem. The group participated in the intervention "Collage of Me" where patients were able to create a collage of positive things that makes them who they are. Clinical Observations/Feedback:  Patient came to group and was focused on what peers and staff had to say about self-esteem. Individual participated in the intervention during group. Eyleen Rawlinson LRT/CTRS          Anuel Sitter 03/08/2018 12:02 PM

## 2018-03-08 NOTE — Plan of Care (Signed)
Patient alert and oriented to self, place and situation. Remains non-weight bearing to LLE, wears boot when out of bed. Remains on 1:1 for safety reasons. Up OOB for meals and medications. Denies SI but still endorsing depression. Milieu remains safe with safety sitter and 15 safety checks. Will continue to monitor.

## 2018-03-08 NOTE — Progress Notes (Signed)
1:1 safety notes:  -0800: Patient up in the dayroom eating her breakfast. Sitter at table with patient. -0900: Patient on the telephone with sitter at her side. No distress noted. -1000: Group in the craft room with sitter at her side. -1100: Dayroom no interaction noted. Sitter at bedside. -1200:Dayroom no interaction noted. Sitter at bedside. -1300: Patient on the phone at this time with sitter at this time. -1400: Patient in her room at this time sitting in her wheelchair with sitter at her side. -1500: Patient outside in wheelchair with sitter at her siide, no distress noted. -1600: Patient outside for recreation therapy at this time. Sitter at there side.  1630- Patient discharged from unit.

## 2018-03-08 NOTE — Progress Notes (Signed)
1:1 Note 1900- Patient sleeping. Sitter at bedside. 2000- Patient sleeping. Sitter at bedside. 2100- Patient awake. Compliant with medications. Eating snack. 2200- Patient sleeping. Sitter at bedside. 2300- Patient sleeping. Sitter at bedside. 0000- Patient sleeping. Sitter at bedside. 0100- Patient sleeping. Sitter at bedside. 0200- Patient sleeping. Sitter at bedside. 0300- Patient sleeping. Sitter at bedside. 0400- Patient sleeping. Sitter at bedside. 0500- Patient sleeping. Sitter at bedside. 0600- Compliant with am VS. Sitter at bedside. 0700- Awake in bed. Sitter at bedside.

## 2018-03-08 NOTE — Consult Note (Signed)
[facility]  CONSULT NOTE    Date: 03/08/2018                  Patient Name:  Lauren Warner  MRN: [id number]  DOB: 1965/08/16  Age / Sex: 52 y.o., female         PCP: [name], [place]                 Service Requesting Consult: Dr. Bary Leriche                 Reason for Consult: End Stage Renal Disease            History of Present Illness: Lauren Warner is a 52 y.o. female with end stage renal disease on hemodialysis, hypertension, asthma, major depressive disorder, diabetes mellitus type II, diabetic neuropathy, coronary artery disease, PE, congestive heart failure, hyperlipdemia, who was admitted to [facility] on 03/04/2018 for depression.   Hemodialysis treatment on 10/5. Tolerated treatment well.     HEMODIALYSIS FLOWSHEET:  Blood Flow Rate (mL/min): 400 mL/min Arterial Pressure (mmHg): -170 mmHg Venous Pressure (mmHg): 160 mmHg Transmembrane Pressure (mmHg): 50 mmHg Ultrafiltration Rate (mL/min): 290 mL/min Dialysate Flow Rate (mL/min): 600 ml/min Conductivity: Machine : 13.8 Conductivity: Machine : 13.8 Dialysis Fluid Bolus: Normal Saline Bolus Amount (mL): 250 mL     Medications: Outpatient medications: Medications Prior to Admission  Medication Sig Dispense Refill Last Dose  . albuterol (PROAIR HFA) 108 (90 Base) MCG/ACT inhaler Inhale 2 puffs into the lungs every 6 (six) hours as needed for wheezing or shortness of breath. 1 Inhaler 1 03/03/2018 at Unknown time  . amLODipine (NORVASC) 5 MG tablet Take 5 mg by mouth daily.  0 03/03/2018 at Unknown time  . aspirin EC 81 MG tablet Take 1 tablet (81 mg total) by mouth daily. With food 30 tablet 3 03/03/2018 at Unknown time  . atomoxetine (STRATTERA) 40 MG capsule Take 40 mg by mouth daily.   03/03/2018 at Unknown time  . blood glucose meter kit and supplies KIT Dispense based on patient and insurance preference. Use up to four times daily as directed. (FOR ICD-9 250.00, 250.01). 1 each  0   . calcium acetate (PHOSLO) 667 MG capsule Take 1 capsule (667 mg total) by mouth 3 (three) times daily with meals. 90 capsule 3 03/03/2018 at Unknown time  . carvedilol (COREG) 6.25 MG tablet Take 1 tablet (6.25 mg total) by mouth 2 (two) times daily with a meal. 60 tablet 2 03/03/2018 at 1230 pm  . Chlorhexidine Gluconate Cloth 2 % PADS Apply 6 each topically daily at 6 (six) AM. 60 each 0 03/03/2018 at Unknown time  . clopidogrel (PLAVIX) 75 MG tablet Take 1 tablet (75 mg total) by mouth daily. 30 tablet 3 03/03/2018 at 830 am  . escitalopram (LEXAPRO) 10 MG tablet Take 1 tablet (10 mg total) by mouth daily. 30 tablet 0 03/03/2018 at Unknown time  . gabapentin (NEURONTIN) 300 MG capsule Take 1 capsule (300 mg total) by mouth at bedtime. 30 capsule 0 03/02/2018 at Unknown time  . hydrALAZINE (APRESOLINE) 50 MG tablet Take 1 tablet (50 mg total) by mouth 3 (three) times daily. 30 tablet 0   . insulin lispro (HUMALOG) 100 UNIT/ML KiwkPen Inject 0.03 mLs (3 Units total) into the skin 3 (three) times daily. 15 mL 0 03/03/2018 at Unknown time  . latanoprost (XALATAN) 0.005 % ophthalmic solution Place 1 drop into both eyes at bedtime. 2.5 mL 12 03/02/2018  at Unknown time  . lidocaine-prilocaine (EMLA) cream Apply 1 application topically as needed. 1 hour before dialysis  12 Past Week at Unknown time  . multivitamin (RENA-VIT) TABS tablet Take 1 tablet by mouth at bedtime. 30 tablet 2 03/03/2018 at Unknown time  . Nutritional Supplements (FEEDING SUPPLEMENT, NEPRO CARB STEADY,) LIQD Take 237 mLs by mouth 2 (two) times daily between meals. (Patient not taking: Reported on 03/03/2018) 60 Can 0 Not Taking at Unknown time  . ondansetron (ZOFRAN) 4 MG tablet Take 1 tablet (4 mg total) by mouth every 8 (eight) hours as needed for nausea or vomiting. 10 tablet 0 03/02/2018 at Unknown time  . sevelamer carbonate (RENVELA) 800 MG tablet Take 3 tablets (2,400 mg total) by mouth 3 (three) times daily with meals. 270 tablet 0  03/03/2018 at Unknown time    Current medications: Current Facility-Administered Medications  Medication Dose Route Frequency Provider Last Rate Last Dose  . 0.9 %  sodium chloride infusion  100 mL Intravenous PRN Lateef, Munsoor, MD      . 0.9 %  sodium chloride infusion  100 mL Intravenous PRN Lateef, Munsoor, MD      . albuterol (PROVENTIL HFA;VENTOLIN HFA) 108 (90 Base) MCG/ACT inhaler 2 puff  2 puff Inhalation Q6H PRN Pucilowska, Jolanta B, MD      . alum & mag hydroxide-simeth (MAALOX/MYLANTA) 200-200-20 MG/5ML suspension 30 mL  30 mL Oral Q4H PRN Pucilowska, Jolanta B, MD      . amLODipine (NORVASC) tablet 5 mg  5 mg Oral Daily Pucilowska, Jolanta B, MD   5 mg at 03/08/18 0742  . atomoxetine (STRATTERA) capsule 40 mg  40 mg Oral Daily Pucilowska, Jolanta B, MD   40 mg at 03/08/18 0743  . calcium acetate (PHOSLO) capsule 667 mg  667 mg Oral TID WC Pucilowska, Jolanta B, MD   667 mg at 03/08/18 1203  . carvedilol (COREG) tablet 6.25 mg  6.25 mg Oral BID WC Pucilowska, Jolanta B, MD   6.25 mg at 03/08/18 0742  . Chlorhexidine Gluconate Cloth 2 % PADS 6 each  6 each Topical Q0600 Anthonette Legato, MD   6 each at 03/07/18 0741  . clopidogrel (PLAVIX) tablet 75 mg  75 mg Oral Daily Pucilowska, Jolanta B, MD   75 mg at 03/08/18 0742  . escitalopram (LEXAPRO) tablet 10 mg  10 mg Oral Daily Pucilowska, Jolanta B, MD   10 mg at 03/08/18 0742  . feeding supplement (GLUCERNA SHAKE) (GLUCERNA SHAKE) liquid 237 mL  237 mL Oral TID BM He, Jun, MD   237 mL at 03/08/18 1334  . gabapentin (NEURONTIN) capsule 300 mg  300 mg Oral QHS Pucilowska, Jolanta B, MD   300 mg at 03/07/18 2043  . hydrOXYzine (ATARAX/VISTARIL) tablet 25 mg  25 mg Oral TID PRN Pucilowska, Jolanta B, MD      . insulin aspart (novoLOG) injection 0-9 Units  0-9 Units Subcutaneous TID WC Pucilowska, Jolanta B, MD   2 Units at 03/08/18 1202  . latanoprost (XALATAN) 0.005 % ophthalmic solution 1 drop  1 drop Both Eyes QHS Pucilowska, Jolanta  B, MD   1 drop at 03/07/18 2042  . lidocaine (PF) (XYLOCAINE) 1 % injection 5 mL  5 mL Intradermal PRN Lateef, Munsoor, MD      . lidocaine-prilocaine (EMLA) cream 1 application  1 application Topical PRN Lateef, Munsoor, MD      . magnesium hydroxide (MILK OF MAGNESIA) suspension 30 mL  30 mL Oral Daily PRN  Pucilowska, Jolanta B, MD      . multivitamin (RENA-VIT) tablet 1 tablet  1 tablet Oral QHS Pucilowska, Jolanta B, MD   1 tablet at 03/07/18 2042  . ondansetron (ZOFRAN-ODT) disintegrating tablet 4 mg  4 mg Oral Q8H PRN Pucilowska, Jolanta B, MD      . pentafluoroprop-tetrafluoroeth (GEBAUERS) aerosol 1 application  1 application Topical PRN Lateef, Munsoor, MD      . QUEtiapine (SEROQUEL) tablet 100 mg  100 mg Oral QHS Pucilowska, Jolanta B, MD   100 mg at 03/07/18 2043  . sevelamer carbonate (RENVELA) tablet 800 mg  800 mg Oral TID WC Pucilowska, Jolanta B, MD   800 mg at 03/08/18 1210  . traMADol (ULTRAM) tablet 50 mg  50 mg Oral Q6H PRN Pucilowska, Jolanta B, MD   50 mg at 03/07/18 2103  . traZODone (DESYREL) tablet 100 mg  100 mg Oral QHS PRN Pucilowska, Jolanta B, MD   100 mg at 03/07/18 2043      Allergies: Allergies  Allergen Reactions  . Adhesive [Tape] Other (See Comments)    "RIPS MY SKIN," SO PLEASE USE COBAN WRAP!!  . Hydrocodone-Acetaminophen Other (See Comments)    "Makes me feel crazy"  . Metformin And Related Other (See Comments)    "Makes me feel crazy"      Past Medical History: Past Medical History:  Diagnosis Date  . Anemia   . Anxiety   . Asthma   . CHF (congestive heart failure) ([place])   . Coronary artery disease   . Daily headache   . Depression   . ESRD (end stage renal disease) on dialysis ([place])    "[facility]; TTS" (10/19/2017)  . High cholesterol   . History of blood transfusion 10/19/2017   "anemia"  . Hyperkalemia 10/2017  . Hypertension   . On home oxygen therapy    "2L; daily" (10/19/2017)  . Pneumonia    "several times" (10/19/2017)  .  Pulmonary embolism ([place]) 2012  . Type 1 diabetes mellitus ([place])      Past Surgical History: Past Surgical History:  Procedure Laterality Date  . AV FISTULA PLACEMENT Left 2018  . CESAREAN SECTION  1989  . CORONARY ANGIOPLASTY WITH STENT PLACEMENT  ~ 08/2017   "3 stents" (10/19/2017)  . ENDOMETRIAL ABLATION  ~ 2011     Family History: Family History  Problem Relation Age of Onset  . Stroke Sister      Social History: Social History   Socioeconomic History  . Marital status: Married    Spouse name: Not on file  . Number of children: Not on file  . Years of education: Not on file  . Highest education level: Not on file  Occupational History  . Not on file  Social Needs  . Financial resource strain: Not on file  . Food insecurity:    Worry: Not on file    Inability: Not on file  . Transportation needs:    Medical: Not on file    Non-medical: Not on file  Tobacco Use  . Smoking status: Never Smoker  . Smokeless tobacco: Never Used  Substance and Sexual Activity  . Alcohol use: Never    Frequency: Never  . Drug use: Never  . Sexual activity: Not Currently  Lifestyle  . Physical activity:    Days per week: Not on file    Minutes per session: Not on file  . Stress: Not on file  Relationships  . Social connections:  Talks on phone: Not on file    Gets together: Not on file    Attends religious service: Not on file    Active member of club or organization: Not on file    Attends meetings of clubs or organizations: Not on file    Relationship status: Not on file  . Intimate partner violence:    Fear of current or ex partner: Not on file    Emotionally abused: Not on file    Physically abused: Not on file    Forced sexual activity: Not on file  Other Topics Concern  . Not on file  Social History Narrative  . Not on file     Review of Systems: Review of Systems  Constitutional: Negative.   HENT: Negative.   Eyes: Negative.   Respiratory: Negative.    Cardiovascular: Negative.   Gastrointestinal: Negative.   Genitourinary: Negative.   Musculoskeletal: Negative.   Skin: Negative.   Neurological: Negative.   Endo/Heme/Allergies: Negative.   Psychiatric/Behavioral: Negative.   All other systems reviewed and are negative.   Vital Signs: Blood pressure (!) 143/71, pulse 67, temperature 98.6 F (37 C), temperature source Oral, resp. rate 18, height _0  (1.702 m), weight 94.4 kg, SpO2 97 %.  Weight trends: Filed Weights   03/05/18 0012 03/06/18 1915  Weight: 94.8 kg 94.4 kg    Physical Exam: General: NAD,   Head: Normocephalic, atraumatic. Moist oral mucosal membranes  Eyes: Anicteric, PERRL  Neck: Supple, trachea midline  Lungs:  Clear to auscultation  Heart: Regular rate and rhythm  Abdomen:  Soft, nontender,   Extremities: no peripheral edema.  Neurologic: Nonfocal, moving all four extremities  Skin: No lesions  Access: AVF     Lab results: Basic Metabolic Panel: Recent Labs  Lab 03/03/18 1545 03/04/18 0645 03/06/18 1547 03/07/18 0625  NA 140 136  --  136  K 6.0* 6.7*  --  4.7  CL 99 101  --  94*  CO2 27 20*  --  28  GLUCOSE 227* 162*  --  150*  BUN 89* 95*  --  36*  CREATININE 12.31* 12.68*  --  6.75*  CALCIUM 7.8* 7.3*  --  8.6*  PHOS  --  8.3* 6.0*  --     Liver Function Tests: Recent Labs  Lab 03/03/18 1545 03/04/18 0645  AST 8*  --   ALT 15  --   ALKPHOS 98  --   BILITOT 0.5  --   PROT 7.4  --   ALBUMIN 3.6 3.2*   No results for input(s): LIPASE, AMYLASE in the last 168 hours. No results for input(s): AMMONIA in the last 168 hours.  CBC: Recent Labs  Lab 03/03/18 1545 03/04/18 0645  WBC 5.0 5.2  HGB 8.1* 7.7*  HCT 26.8* 25.6*  MCV 78.4 80.8  PLT 262 200    Cardiac Enzymes: No results for input(s): CKTOTAL, CKMB, CKMBINDEX, TROPONINI in the last 168 hours.  BNP: Invalid input(s): POCBNP  CBG: Recent Labs  Lab 03/07/18 1106 03/07/18 1609 03/07/18 2041 03/08/18 0706  03/08/18 1159  GLUCAP 213* 131* 223* 167* 162*    Microbiology: Results for orders placed or performed during the hospital encounter of 03/03/18  MRSA PCR Screening     Status: Abnormal   Collection Time: 03/04/18  2:12 AM  Result Value Ref Range Status   MRSA by PCR (A) NEGATIVE Final    INVALID, UNABLE TO DETERMINE THE PRESENCE OF TARGET DNA DUE TO SPECIMEN INTEGRITY.  RECOLLECTION REQUESTED.    CommentAngela Burke RN [id number] 03/04/18 A BROWNING Performed at [facility] [address] [place], [place] [address]     Coagulation Studies: No results for input(s): LABPROT, INR in the last 72 hours.  Urinalysis: No results for input(s): COLORURINE, LABSPEC, PHURINE, GLUCOSEU, HGBUR, BILIRUBINUR, KETONESUR, PROTEINUR, UROBILINOGEN, NITRITE, LEUKOCYTESUR in the last 72 hours.  Invalid input(s): APPERANCEUR    Imaging:  No results found.   Assessment & Plan: Lauren Warner is a 52 y.o. female with end stage renal disease on hemodialysis, hypertension, asthma, major depressive disorder, diabetes mellitus type II, diabetic neuropathy, coronary artery disease, PE, congestive heart failure, hyperlipdemia, who was admitted to [facility] on 03/04/2018 for depression.   1. End Stage Renal Disease: on hemodialysis TTS  2. Hypertension:  - amlodipine  3. Anemia of chronic kidney disease: hemoglobin 7.7 - EPO with HD treatment  4. Secondary Hyperparathyroidism: with hypocalcemia and hypophosphatemia.  - sevelamer and calcium acetate.     LOS: 4 Lauren Warner 10/7/20194:13 PM

## 2018-03-08 NOTE — BHH Suicide Risk Assessment (Signed)
[facility] Discharge Suicide Risk Assessment   Principal Problem: Major depressive disorder, recurrent severe without psychotic features [facility]) Discharge Diagnoses:  Patient Active Problem List   Diagnosis Date Noted  . Major depressive disorder, recurrent severe without psychotic features ([place]) [F33.2]     Priority: High  . MDD (major depressive disorder), single episode, severe , no psychosis ([place]) [F32.2]   . SOB (shortness of breath) [R06.02] 01/22/2018  . Respiratory failure ([place]) [J96.90] 01/21/2018  . Acute encephalopathy [G93.40] 01/07/2018  . Suicidal ideation [R45.851] 01/07/2018  . Hypertensive encephalopathy [I67.4] 12/23/2017  . Hypertensive urgency [I16.0] 12/22/2017  . Syncope and collapse [R55] 12/03/2017  . Hyperkalemia [E87.5] 11/10/2017  . Pressure injury of skin [L89.90] 10/20/2017  . Essential hypertension [I10] 10/19/2017  . CAD (coronary artery disease) [I25.10] 10/19/2017  . ESRD (end stage renal disease) ([place]) [N18.6] 10/19/2017  . Syncope [R55] 10/19/2017  . Anemia due to end stage renal disease ([place]) [N18.6, D63.1] 10/19/2017  . Closed left ankle fracture [S82.892A] 10/19/2017  . Nausea vomiting and diarrhea [R11.2, R19.7] 10/19/2017  . DM (diabetes mellitus), type 2 with renal complications ([place]) [E56.31] 10/19/2017  . Acute respiratory failure with hypoxia ([place]) [J96.01] 10/18/2017    Total Time spent with patient: 20 minutes  Musculoskeletal: Strength & Muscle Tone: within normal limits Gait & Station: normal Patient leans: N/A  Psychiatric Specialty Exam: Review of Systems  Neurological: Negative.   Psychiatric/Behavioral: Negative.   All other systems reviewed and are negative.   Blood pressure (!) 143/71, pulse 67, temperature 98.6 F (37 C), temperature source Oral, resp. rate 18, height 5\' 7"  (1.702 m), weight 94.4 kg, SpO2 97 %.Body mass index is 32.6 kg/m.  General Appearance: Casual  Eye Contact::  Good  Speech:  Clear and Coherent409   Volume:  Normal  Mood:  Euthymic  Affect:  Appropriate  Thought Process:  Goal Directed and Descriptions of Associations: Intact  Orientation:  Full (Time, Place, and Person)  Thought Content:  WDL  Suicidal Thoughts:  No  Homicidal Thoughts:  No  Memory:  Immediate;   Fair Recent;   Fair Remote;   Fair  Judgement:  Impaired  Insight:  Shallow  Psychomotor Activity:  Normal  Concentration:  Fair  Recall:  [place]  Language: Fair  Akathisia:  No  Handed:  Right  AIMS (if indicated):     Assets:  Communication Skills Desire for Improvement Financial Resources/Insurance Resilience  Sleep:  Number of Hours: 7.75  Cognition: WNL  ADL's:  Intact   Mental Status Per Nursing Assessment::   On Admission:  Suicidal ideation indicated by others  Demographic Factors:  Low socioeconomic status and Living alone  Loss Factors: Loss of significant relationship and Financial problems/change in socioeconomic status  Historical Factors: Impulsivity  Risk Reduction Factors:   Sense of responsibility to family and Religious beliefs about death  Continued Clinical Symptoms:  Depression:   Impulsivity  Cognitive Features That Contribute To Risk:  None    Suicide Risk:  Minimal: No identifiable suicidal ideation.  Patients presenting with no risk factors but with morbid ruminations; may be classified as minimal risk based on the severity of the depressive symptoms    Plan Of Care/Follow-up recommendations:  Activity:  as tolerated Diet:  renal ADA diet Other:  keep follow up appointments  Orson Slick, MD 03/08/2018, 1:28 PM

## 2018-03-08 NOTE — Progress Notes (Signed)
Patient alert and oriented x 4.  Verbally denies SI/HI/AVH and pain. Patient discharged on above date and time. Verbalized understanding the discharge information provided to patient upon discharge. Patient departed unit with discharge paperwork and personal belongings. Picked up by friend of the family o go home and follow up with [place] services.

## 2018-03-12 ENCOUNTER — Ambulatory Visit (INDEPENDENT_AMBULATORY_CARE_PROVIDER_SITE_OTHER): Payer: Medicaid Other | Admitting: Podiatry

## 2018-03-12 ENCOUNTER — Encounter: Payer: Self-pay | Admitting: Podiatry

## 2018-03-12 ENCOUNTER — Encounter

## 2018-03-12 DIAGNOSIS — M14672 Charcot's joint, left ankle and foot: Secondary | ICD-10-CM | POA: Diagnosis not present
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
## 2018-04-17 ENCOUNTER — Emergency Department (HOSPITAL_COMMUNITY): Payer: Medicaid Other

## 2018-04-17 ENCOUNTER — Other Ambulatory Visit: Payer: Self-pay

## 2018-04-17 ENCOUNTER — Inpatient Hospital Stay (HOSPITAL_COMMUNITY)
Admission: EM | Admit: 2018-04-17 | Discharge: 2018-04-23 | DRG: 640 | Disposition: A | Discharge status: Home Health | Payer: Medicaid Other | Attending: Internal Medicine | Admitting: Internal Medicine

## 2018-04-17 DIAGNOSIS — G9341 Metabolic encephalopathy: Secondary | ICD-10-CM | POA: Insufficient documentation

## 2018-04-17 DIAGNOSIS — R011 Cardiac murmur, unspecified: Secondary | ICD-10-CM

## 2018-04-17 DIAGNOSIS — R0989 Other specified symptoms and signs involving the circulatory and respiratory systems: Secondary | ICD-10-CM | POA: Diagnosis present

## 2018-04-17 DIAGNOSIS — E1022 Type 1 diabetes mellitus with diabetic chronic kidney disease: Secondary | ICD-10-CM | POA: Diagnosis present

## 2018-04-17 DIAGNOSIS — R278 Other lack of coordination: Secondary | ICD-10-CM | POA: Diagnosis present

## 2018-04-17 DIAGNOSIS — G9340 Encephalopathy, unspecified: Secondary | ICD-10-CM | POA: Diagnosis not present

## 2018-04-17 DIAGNOSIS — N2581 Secondary hyperparathyroidism of renal origin: Secondary | ICD-10-CM | POA: Diagnosis present

## 2018-04-17 DIAGNOSIS — F332 Major depressive disorder, recurrent severe without psychotic features: Secondary | ICD-10-CM

## 2018-04-17 DIAGNOSIS — Z888 Allergy status to other drugs, medicaments and biological substances status: Secondary | ICD-10-CM

## 2018-04-17 DIAGNOSIS — N186 End stage renal disease: Secondary | ICD-10-CM | POA: Diagnosis present

## 2018-04-17 DIAGNOSIS — Z794 Long term (current) use of insulin: Secondary | ICD-10-CM

## 2018-04-17 DIAGNOSIS — Z9115 Patient's noncompliance with renal dialysis: Secondary | ICD-10-CM | POA: Diagnosis not present

## 2018-04-17 DIAGNOSIS — Z86711 Personal history of pulmonary embolism: Secondary | ICD-10-CM

## 2018-04-17 DIAGNOSIS — E875 Hyperkalemia: Secondary | ICD-10-CM | POA: Diagnosis present

## 2018-04-17 DIAGNOSIS — Z823 Family history of stroke: Secondary | ICD-10-CM

## 2018-04-17 DIAGNOSIS — F909 Attention-deficit hyperactivity disorder, unspecified type: Secondary | ICD-10-CM | POA: Diagnosis present

## 2018-04-17 DIAGNOSIS — I5030 Unspecified diastolic (congestive) heart failure: Secondary | ICD-10-CM | POA: Diagnosis not present

## 2018-04-17 DIAGNOSIS — I2510 Atherosclerotic heart disease of native coronary artery without angina pectoris: Secondary | ICD-10-CM | POA: Diagnosis present

## 2018-04-17 DIAGNOSIS — E7800 Pure hypercholesterolemia, unspecified: Secondary | ICD-10-CM | POA: Diagnosis present

## 2018-04-17 DIAGNOSIS — E1129 Type 2 diabetes mellitus with other diabetic kidney complication: Secondary | ICD-10-CM | POA: Diagnosis present

## 2018-04-17 DIAGNOSIS — D649 Anemia, unspecified: Secondary | ICD-10-CM

## 2018-04-17 DIAGNOSIS — R45851 Suicidal ideations: Secondary | ICD-10-CM

## 2018-04-17 DIAGNOSIS — E1043 Type 1 diabetes mellitus with diabetic autonomic (poly)neuropathy: Secondary | ICD-10-CM | POA: Diagnosis present

## 2018-04-17 DIAGNOSIS — F339 Major depressive disorder, recurrent, unspecified: Secondary | ICD-10-CM

## 2018-04-17 DIAGNOSIS — Z955 Presence of coronary angioplasty implant and graft: Secondary | ICD-10-CM | POA: Diagnosis not present

## 2018-04-17 DIAGNOSIS — I509 Heart failure, unspecified: Secondary | ICD-10-CM | POA: Insufficient documentation

## 2018-04-17 DIAGNOSIS — I5032 Chronic diastolic (congestive) heart failure: Secondary | ICD-10-CM | POA: Diagnosis present

## 2018-04-17 DIAGNOSIS — Z79899 Other long term (current) drug therapy: Secondary | ICD-10-CM

## 2018-04-17 DIAGNOSIS — S82892S Other fracture of left lower leg, sequela: Secondary | ICD-10-CM

## 2018-04-17 DIAGNOSIS — Z992 Dependence on renal dialysis: Secondary | ICD-10-CM

## 2018-04-17 DIAGNOSIS — R0603 Acute respiratory distress: Secondary | ICD-10-CM | POA: Diagnosis present

## 2018-04-17 DIAGNOSIS — I10 Essential (primary) hypertension: Secondary | ICD-10-CM | POA: Diagnosis not present

## 2018-04-17 DIAGNOSIS — E872 Acidosis: Secondary | ICD-10-CM | POA: Diagnosis present

## 2018-04-17 DIAGNOSIS — J811 Chronic pulmonary edema: Secondary | ICD-10-CM | POA: Diagnosis present

## 2018-04-17 DIAGNOSIS — E109 Type 1 diabetes mellitus without complications: Secondary | ICD-10-CM | POA: Diagnosis not present

## 2018-04-17 DIAGNOSIS — J9601 Acute respiratory failure with hypoxia: Secondary | ICD-10-CM

## 2018-04-17 DIAGNOSIS — D631 Anemia in chronic kidney disease: Secondary | ICD-10-CM | POA: Diagnosis present

## 2018-04-17 DIAGNOSIS — Z885 Allergy status to narcotic agent status: Secondary | ICD-10-CM

## 2018-04-17 DIAGNOSIS — M79605 Pain in left leg: Secondary | ICD-10-CM | POA: Diagnosis present

## 2018-04-17 DIAGNOSIS — I132 Hypertensive heart and chronic kidney disease with heart failure and with stage 5 chronic kidney disease, or end stage renal disease: Secondary | ICD-10-CM | POA: Diagnosis present

## 2018-04-17 DIAGNOSIS — E8779 Other fluid overload: Secondary | ICD-10-CM | POA: Diagnosis present

## 2018-04-17 DIAGNOSIS — Z91048 Other nonmedicinal substance allergy status: Secondary | ICD-10-CM

## 2018-04-17 DIAGNOSIS — E8770 Fluid overload, unspecified: Secondary | ICD-10-CM

## 2018-04-17 DIAGNOSIS — Z915 Personal history of self-harm: Secondary | ICD-10-CM

## 2018-04-17 LAB — I-STAT CHEM 8, ED
BUN: >140 mg/dL — ABNORMAL HIGH (ref 6–20)
CHLORIDE: 109 mmol/L (ref 98–111)
Calcium, Ion: 0.75 mmol/L — CL (ref 1.15–1.40)
Creatinine, Ser: >18 mg/dL — ABNORMAL HIGH (ref 0.44–1.00)
Glucose, Bld: 70 mg/dL (ref 70–99)
HEMATOCRIT: 23 % — AB (ref 36.0–46.0)
HEMOGLOBIN: 7.8 g/dL — AB (ref 12.0–15.0)
Potassium: 7.5 mmol/L (ref 3.5–5.1)
SODIUM: 133 mmol/L — AB (ref 135–145)
TCO2: 16 mmol/L — ABNORMAL LOW (ref 22–32)

## 2018-04-17 LAB — CBC WITH DIFFERENTIAL/PLATELET
ABS IMMATURE GRANULOCYTES: 0.02 10*3/uL (ref 0.00–0.07)
BASOS ABS: 0 10*3/uL (ref 0.0–0.1)
Basophils Relative: 0 %
EOS ABS: 0.1 10*3/uL (ref 0.0–0.5)
Eosinophils Relative: 2 %
HEMATOCRIT: 23.5 % — AB (ref 36.0–46.0)
HEMOGLOBIN: 7.1 g/dL — AB (ref 12.0–15.0)
IMMATURE GRANULOCYTES: 0 %
LYMPHS PCT: 14 %
Lymphs Abs: 0.7 10*3/uL (ref 0.7–4.0)
MCH: 23.8 pg — ABNORMAL LOW (ref 26.0–34.0)
MCHC: 30.2 g/dL (ref 30.0–36.0)
MCV: 78.9 fL — ABNORMAL LOW (ref 80.0–100.0)
MONOS PCT: 7 %
Monocytes Absolute: 0.4 10*3/uL (ref 0.1–1.0)
NEUTROS ABS: 4 10*3/uL (ref 1.7–7.7)
NEUTROS PCT: 77 %
NRBC: 0 % (ref 0.0–0.2)
Platelets: 185 10*3/uL (ref 150–400)
RBC: 2.98 MIL/uL — ABNORMAL LOW (ref 3.87–5.11)
RDW: 17.5 % — AB (ref 11.5–15.5)
WBC: 5.3 10*3/uL (ref 4.0–10.5)

## 2018-04-17 LAB — I-STAT ARTERIAL BLOOD GAS, ED
ACID-BASE DEFICIT: 12 mmol/L — AB (ref 0.0–2.0)
Bicarbonate: 14.4 mmol/L — ABNORMAL LOW (ref 20.0–28.0)
O2 Saturation: 78 %
PCO2 ART: 33.3 mmHg (ref 32.0–48.0)
PH ART: 7.243 — AB (ref 7.350–7.450)
PO2 ART: 48 mmHg — AB (ref 83.0–108.0)
Patient temperature: 97.7
TCO2: 15 mmol/L — ABNORMAL LOW (ref 22–32)

## 2018-04-17 LAB — BRAIN NATRIURETIC PEPTIDE: B NATRIURETIC PEPTIDE 5: 765.6 pg/mL — AB (ref 0.0–100.0)

## 2018-04-17 LAB — BASIC METABOLIC PANEL
ANION GAP: 20 — AB (ref 5–15)
BUN: 161 mg/dL — ABNORMAL HIGH (ref 6–20)
CHLORIDE: 102 mmol/L (ref 98–111)
CO2: 14 mmol/L — ABNORMAL LOW (ref 22–32)
Calcium: 6.5 mg/dL — ABNORMAL LOW (ref 8.9–10.3)
Creatinine, Ser: 17.64 mg/dL — ABNORMAL HIGH (ref 0.44–1.00)
GFR calc non Af Amer: 2 mL/min — ABNORMAL LOW (ref >60)
GFR, EST AFRICAN AMERICAN: 2 mL/min — AB (ref >60)
GLUCOSE: 72 mg/dL (ref 70–99)
Sodium: 136 mmol/L (ref 135–145)

## 2018-04-17 LAB — I-STAT TROPONIN, ED: Troponin i, poc: 0.03 ng/mL (ref 0.00–0.08)

## 2018-04-17 LAB — GLUCOSE, CAPILLARY: Glucose-Capillary: 73 mg/dL (ref 70–99)

## 2018-04-17 MED ORDER — DARBEPOETIN ALFA 200 MCG/0.4ML IJ SOSY: 200.0000 ug | PREFILLED_SYRINGE | INTRAMUSCULAR | Status: DC

## 2018-04-17 MED ORDER — DOXERCALCIFEROL 4 MCG/2ML IV SOLN
2.0000 ug | INTRAVENOUS | Status: DC
  Administered 2018-04-20 – 2018-04-22 (×2): 2 ug via INTRAVENOUS
  Filled 2018-04-17 (×2): qty 2

## 2018-04-17 MED ORDER — ESCITALOPRAM OXALATE 10 MG PO TABS
10.0000 mg | ORAL_TABLET | Freq: Every day | ORAL | Status: DC
  Administered 2018-04-19 – 2018-04-23 (×5): 10 mg via ORAL
  Filled 2018-04-17 (×5): qty 1

## 2018-04-17 MED ORDER — INSULIN ASPART 100 UNIT/ML ~~LOC~~ SOLN
0.0000 [IU] | Freq: Three times a day (TID) | SUBCUTANEOUS | Status: DC
  Administered 2018-04-19 – 2018-04-22 (×5): 1 [IU] via SUBCUTANEOUS
  Administered 2018-04-23: 2 [IU] via SUBCUTANEOUS
  Administered 2018-04-23: 1 [IU] via SUBCUTANEOUS

## 2018-04-17 MED ORDER — INSULIN ASPART 100 UNIT/ML ~~LOC~~ SOLN
5.0000 [IU] | Freq: Once | SUBCUTANEOUS | Status: AC
  Administered 2018-04-17: 5 [IU] via INTRAVENOUS

## 2018-04-17 MED ORDER — ALBUTEROL SULFATE (2.5 MG/3ML) 0.083% IN NEBU: 3.0000 mL | INHALATION_SOLUTION | Freq: Four times a day (QID) | RESPIRATORY_TRACT | Status: DC | PRN

## 2018-04-17 MED ORDER — AMLODIPINE BESYLATE 10 MG PO TABS: 10.0000 mg | ORAL_TABLET | Freq: Every day | ORAL | Status: DC

## 2018-04-17 MED ORDER — QUETIAPINE FUMARATE 25 MG PO TABS
100.0000 mg | ORAL_TABLET | Freq: Every day | ORAL | Status: DC
  Administered 2018-04-17 – 2018-04-22 (×6): 100 mg via ORAL
  Filled 2018-04-17: qty 1
  Filled 2018-04-17 (×4): qty 4
  Filled 2018-04-17 (×2): qty 1
  Filled 2018-04-17 (×2): qty 4

## 2018-04-17 MED ORDER — CALCIUM GLUCONATE 10 % IV SOLN
INTRAVENOUS | Status: AC
  Filled 2018-04-17: qty 10

## 2018-04-17 MED ORDER — CLOPIDOGREL BISULFATE 75 MG PO TABS
75.0000 mg | ORAL_TABLET | Freq: Every day | ORAL | Status: DC
  Administered 2018-04-19 – 2018-04-23 (×5): 75 mg via ORAL
  Filled 2018-04-17 (×5): qty 1

## 2018-04-17 MED ORDER — CALCIUM CHLORIDE 10 % IV SOLN
INTRAVENOUS | Status: AC
  Filled 2018-04-17: qty 10

## 2018-04-17 MED ORDER — TRAZODONE HCL 100 MG PO TABS
100.0000 mg | ORAL_TABLET | Freq: Every evening | ORAL | Status: DC | PRN
  Administered 2018-04-21: 100 mg via ORAL
  Filled 2018-04-17: qty 1

## 2018-04-17 MED ORDER — SODIUM BICARBONATE 8.4 % IV SOLN
50.0000 meq | Freq: Once | INTRAVENOUS | Status: AC
  Administered 2018-04-17: 50 meq via INTRAVENOUS
  Filled 2018-04-17: qty 50

## 2018-04-17 MED ORDER — HEPARIN SODIUM (PORCINE) 5000 UNIT/ML IJ SOLN
5000.0000 [IU] | Freq: Three times a day (TID) | INTRAMUSCULAR | Status: DC
  Administered 2018-04-17 – 2018-04-23 (×16): 5000 [IU] via SUBCUTANEOUS
  Filled 2018-04-17 (×16): qty 1

## 2018-04-17 MED ORDER — DEXTROSE 50 % IV SOLN
INTRAVENOUS | Status: AC
  Filled 2018-04-17: qty 50

## 2018-04-17 MED ORDER — INSULIN ASPART 100 UNIT/ML ~~LOC~~ SOLN: 0.0000 [IU] | Freq: Every day | SUBCUTANEOUS | Status: DC

## 2018-04-17 MED ORDER — ASPIRIN EC 81 MG PO TBEC
81.0000 mg | DELAYED_RELEASE_TABLET | Freq: Every day | ORAL | Status: DC
  Administered 2018-04-19 – 2018-04-23 (×5): 81 mg via ORAL
  Filled 2018-04-17 (×5): qty 1

## 2018-04-17 MED ORDER — LABETALOL HCL 5 MG/ML IV SOLN
10.0000 mg | INTRAVENOUS | Status: DC | PRN
  Administered 2018-04-17 – 2018-04-19 (×2): 10 mg via INTRAVENOUS
  Filled 2018-04-17 (×2): qty 4

## 2018-04-17 MED ORDER — ACETAMINOPHEN 650 MG RE SUPP: 650.0000 mg | Freq: Four times a day (QID) | RECTAL | Status: DC | PRN

## 2018-04-17 MED ORDER — GABAPENTIN 300 MG PO CAPS
300.0000 mg | ORAL_CAPSULE | Freq: Every day | ORAL | Status: DC
  Administered 2018-04-17 – 2018-04-19 (×3): 300 mg via ORAL
  Filled 2018-04-17 (×3): qty 1

## 2018-04-17 MED ORDER — ACETAMINOPHEN 325 MG PO TABS
650.0000 mg | ORAL_TABLET | Freq: Four times a day (QID) | ORAL | Status: DC | PRN
  Administered 2018-04-19 – 2018-04-23 (×7): 650 mg via ORAL
  Filled 2018-04-17 (×7): qty 2

## 2018-04-17 MED ORDER — DEXTROSE 50 % IV SOLN
1.0000 | Freq: Once | INTRAVENOUS | Status: AC
  Administered 2018-04-17: 50 mL via INTRAVENOUS

## 2018-04-17 MED ORDER — ATOMOXETINE HCL 40 MG PO CAPS
40.0000 mg | ORAL_CAPSULE | Freq: Every day | ORAL | Status: DC
  Administered 2018-04-19 – 2018-04-23 (×5): 40 mg via ORAL
  Filled 2018-04-17 (×7): qty 1

## 2018-04-17 MED ORDER — ONDANSETRON HCL 4 MG PO TABS: 4.0000 mg | ORAL_TABLET | Freq: Four times a day (QID) | ORAL | Status: DC | PRN

## 2018-04-17 MED ORDER — INSULIN ASPART 100 UNIT/ML ~~LOC~~ SOLN
SUBCUTANEOUS | Status: AC
  Filled 2018-04-17: qty 1

## 2018-04-17 MED ORDER — CARVEDILOL 6.25 MG PO TABS
6.2500 mg | ORAL_TABLET | Freq: Two times a day (BID) | ORAL | Status: DC
  Administered 2018-04-19 – 2018-04-23 (×9): 6.25 mg via ORAL
  Filled 2018-04-17 (×9): qty 1

## 2018-04-17 MED ORDER — ONDANSETRON HCL 4 MG/2ML IJ SOLN
4.0000 mg | Freq: Four times a day (QID) | INTRAMUSCULAR | Status: DC | PRN
  Administered 2018-04-20 – 2018-04-23 (×3): 4 mg via INTRAVENOUS
  Filled 2018-04-17 (×3): qty 2

## 2018-04-17 MED ORDER — CALCIUM CHLORIDE 10 % IV SOLN
1.0000 g | Freq: Once | INTRAVENOUS | Status: AC
  Administered 2018-04-17: 1 g via INTRAVENOUS

## 2018-04-17 MED ORDER — LABETALOL HCL 5 MG/ML IV SOLN: 10.0000 mg | Freq: Once | INTRAVENOUS | Status: DC

## 2018-04-17 MED ORDER — ALBUTEROL SULFATE (2.5 MG/3ML) 0.083% IN NEBU
10.0000 mg | INHALATION_SOLUTION | Freq: Once | RESPIRATORY_TRACT | Status: AC
  Administered 2018-04-17: 10 mg via RESPIRATORY_TRACT

## 2018-04-17 MED ORDER — SODIUM CHLORIDE 0.9 % IV SOLN
1.0000 g | Freq: Once | INTRAVENOUS | Status: DC
  Filled 2018-04-17: qty 10
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
## 2018-04-17 NOTE — ED Triage Notes (Signed)
Pt in from home via [facility] with respiratory distress, has not had dialysis in 11 days, states she no longer wants to live. Sats were 89% on RA, arrives on CPAP lethargic but able to speak in short sentences.

## 2018-04-17 NOTE — ED Notes (Signed)
Dialysis called, said that pt will not be accepted on Bipap to 55M. Needs level of care change to Stepdown in order to get bed. Dr. Beryle Beams contacted, he will change pt's status to stepdown

## 2018-04-17 NOTE — Consult Note (Addendum)
[facility] Renal Consultation Note  Indication for Consultation:  Management of ESRD/hemodialysis; anemia, hypertension/volume and secondary hyperparathyroidism  HPI: Lauren Warner is a 52 y.o. female hx of ESRD presumed 2/2 dm/htn  on HD  (TTS)(moved from [place] 09/2017 w/o HD setup earlier this year.  She has not had HD since 11/05/(5 txs missed )her  noncompliance ,psychiatric issues prevent her from being compliant with HD. Taken to ED by EMS after her OP HD  Transportation noted need for eval.    BUN 161, Cr 17.64 K 7.5 ,hgb 7.8 ,CXR interstitial pulmonary edema. Pt somnolent on Bipap not able to give history.  We were asked to see for HD.  Hosps=[facility]  5/19-5/22/19 pul edema, ^ K missed HD,   Adm [facility] 06/11-06/13//2019 - missed HD, ^K+  Adm [facility] 08/08-08/11/19 > suicidal and major depression, seen by psych and cleared for dc Adm [facility] 08/22-08/26/19 Acute hypoxic resp failure after missing HD Adm [facility] 10/03-07/19 Missed hd Suicidal ideation      Past Medical History:  Diagnosis Date  . Anemia   . Anxiety   . Asthma   . CHF (congestive heart failure) ([place])   . Coronary artery disease   . Daily headache   . Depression   . ESRD (end stage renal disease) on dialysis ([place])    "[facility]; TTS" (10/19/2017)  . High cholesterol   . History of blood transfusion 10/19/2017   "anemia"  . Hyperkalemia 10/2017  . Hypertension   . On home oxygen therapy    "2L; daily" (10/19/2017)  . Pneumonia    "several times" (10/19/2017)  . Pulmonary embolism ([place]) 2012  . Type 1 diabetes mellitus ([place])     Past Surgical History:  Procedure Laterality Date  . AV FISTULA PLACEMENT Left 2018  . CESAREAN SECTION  1989  . CORONARY ANGIOPLASTY WITH STENT PLACEMENT  ~ 08/2017   "3 stents" (10/19/2017)  . ENDOMETRIAL ABLATION  ~ 2011      Family History  Problem Relation Age of Onset  . Stroke Sister       reports that she has never smoked. She has never used smokeless tobacco. She  reports that she does not drink alcohol or use drugs.   Allergies  Allergen Reactions  . Adhesive [Tape] Other (See Comments)    "RIPS MY SKIN," SO PLEASE USE COBAN WRAP!!  . Hydrocodone-Acetaminophen Other (See Comments)    "Makes me feel crazy"  . Metformin And Related Other (See Comments)    "Makes me feel crazy"    Prior to Admission medications   Medication Sig Start Date End Date Taking? Authorizing Provider  albuterol (PROAIR HFA) 108 (90 Base) MCG/ACT inhaler Inhale 2 puffs into the lungs every 6 (six) hours as needed for wheezing or shortness of breath. 12/04/17   Roxan Hockey, MD  amLODipine (NORVASC) 5 MG tablet Take 5 mg by mouth daily. 01/19/18   [provider]  aspirin EC 81 MG tablet Take 1 tablet (81 mg total) by mouth daily. With food 12/04/17   Roxan Hockey, MD  atomoxetine (STRATTERA) 40 MG capsule Take 40 mg by mouth daily.    [provider]  calcium acetate (PHOSLO) 667 MG capsule Take 1 capsule (667 mg total) by mouth 3 (three) times daily with meals. 12/04/17   Roxan Hockey, MD  carvedilol (COREG) 6.25 MG tablet Take 1 tablet (6.25 mg total) by mouth 2 (two) times daily with a meal. 12/04/17   Roxan Hockey, MD  clopidogrel (PLAVIX)  75 MG tablet Take 1 tablet (75 mg total) by mouth daily. 12/04/17   Roxan Hockey, MD  escitalopram (LEXAPRO) 10 MG tablet Take 1 tablet (10 mg total) by mouth daily. 01/25/18   Bonnielee Haff, MD  gabapentin (NEURONTIN) 300 MG capsule Take 1 capsule (300 mg total) by mouth at bedtime. 01/25/18   Bonnielee Haff, MD  hydrALAZINE (APRESOLINE) 50 MG tablet Take 1 tablet (50 mg total) by mouth 3 (three) times daily. 03/04/18   Lavina Hamman, MD  hydrOXYzine (ATARAX/VISTARIL) 25 MG tablet Take 1 tablet (25 mg total) by mouth 3 (three) times daily as needed for anxiety. 03/08/18   Pucilowska, Jolanta B, MD  insulin lispro (HUMALOG) 100 UNIT/ML KiwkPen Inject 0.03 mLs (3 Units total) into the skin 3 (three) times daily.  01/25/18   Bonnielee Haff, MD  latanoprost (XALATAN) 0.005 % ophthalmic solution Place 1 drop into both eyes at bedtime. 12/04/17   Roxan Hockey, MD  lidocaine-prilocaine (EMLA) cream Apply 1 application topically as needed. 1 hour before dialysis 02/11/18   [provider]  multivitamin (RENA-VIT) TABS tablet Take 1 tablet by mouth at bedtime. 12/04/17   Roxan Hockey, MD  ondansetron (ZOFRAN) 4 MG tablet Take 1 tablet (4 mg total) by mouth every 8 (eight) hours as needed for nausea or vomiting. 12/04/17 12/04/18  Roxan Hockey, MD  QUEtiapine (SEROQUEL) 100 MG tablet Take 1 tablet (100 mg total) by mouth at bedtime. 03/08/18   Pucilowska, Herma Ard B, MD  sevelamer carbonate (RENVELA) 800 MG tablet Take 3 tablets (2,400 mg total) by mouth 3 (three) times daily with meals. 01/10/18   Robbie Lis, MD  traZODone (DESYREL) 100 MG tablet Take 1 tablet (100 mg total) by mouth at bedtime as needed for sleep. 03/08/18   Clovis Fredrickson, MD     Anti-infectives (From admission, onward)   None      Results for orders placed or performed during the hospital encounter of 04/17/18 (from the past 48 hour(s))  Basic metabolic panel     Status: Abnormal   Collection Time: 04/17/18  1:08 PM  Result Value Ref Range   Sodium 136 135 - 145 mmol/L   Potassium >7.5 (HH) 3.5 - 5.1 mmol/L    Comment: SLIGHT HEMOLYSIS CRITICAL RESULT CALLED TO, READ BACK BY AND VERIFIED WITH: RN A JASPER AT 1410 04/17/18 BY L BENFIELD    Chloride 102 98 - 111 mmol/L   CO2 14 (L) 22 - 32 mmol/L   Glucose, Bld 72 70 - 99 mg/dL   BUN 161 (H) 6 - 20 mg/dL   Creatinine, Ser 17.64 (H) 0.44 - 1.00 mg/dL   Calcium 6.5 (L) 8.9 - 10.3 mg/dL   GFR calc non Af Amer 2 (L) >60 mL/min   GFR calc Af Amer 2 (L) >60 mL/min    Comment: (NOTE) The eGFR has been calculated using the CKD EPI equation. This calculation has not been validated in all clinical situations. eGFR's persistently <60 mL/min signify possible Chronic  Kidney Disease.    Anion gap 20 (H) 5 - 15    Comment: Performed at [facility], [place] [address] [place], [place] [address]  CBC with Differential     Status: Abnormal   Collection Time: 04/17/18  1:08 PM  Result Value Ref Range   WBC 5.3 4.0 - 10.5 K/uL   RBC 2.98 (L) 3.87 - 5.11 MIL/uL   Hemoglobin 7.1 (L) 12.0 - 15.0 g/dL   HCT 23.5 (L) 36.0 -  46.0 %   MCV 78.9 (L) 80.0 - 100.0 fL   MCH 23.8 (L) 26.0 - 34.0 pg   MCHC 30.2 30.0 - 36.0 g/dL   RDW 17.5 (H) 11.5 - 15.5 %   Platelets 185 150 - 400 K/uL   nRBC 0.0 0.0 - 0.2 %   Neutrophils Relative % 77 %   Neutro Abs 4.0 1.7 - 7.7 K/uL   Lymphocytes Relative 14 %   Lymphs Abs 0.7 0.7 - 4.0 K/uL   Monocytes Relative 7 %   Monocytes Absolute 0.4 0.1 - 1.0 K/uL   Eosinophils Relative 2 %   Eosinophils Absolute 0.1 0.0 - 0.5 K/uL   Basophils Relative 0 %   Basophils Absolute 0.0 0.0 - 0.1 K/uL   Immature Granulocytes 0 %   Abs Immature Granulocytes 0.02 0.00 - 0.07 K/uL    Comment: Performed at [facility] [address], [place], [place] [address]  I-stat troponin, ED     Status: None   Collection Time: 04/17/18  1:16 PM  Result Value Ref Range   Troponin i, poc 0.03 0.00 - 0.08 ng/mL   Comment 3            Comment: Due to the release kinetics of cTnI, a negative result within the first hours of the onset of symptoms does not rule out myocardial infarction with certainty. If myocardial infarction is still suspected, repeat the test at appropriate intervals.   I-stat Chem 8, ED     Status: Abnormal   Collection Time: 04/17/18  1:17 PM  Result Value Ref Range   Sodium 133 (L) 135 - 145 mmol/L   Potassium 7.5 (HH) 3.5 - 5.1 mmol/L   Chloride 109 98 - 111 mmol/L   BUN >140 (H) 6 - 20 mg/dL   Creatinine, Ser >18.00 (H) 0.44 - 1.00 mg/dL   Glucose, Bld 70 70 - 99 mg/dL   Calcium, Ion 0.75 (LL) 1.15 - 1.40 mmol/L   TCO2 16 (L) 22 - 32 mmol/L   Hemoglobin 7.8 (L) 12.0 - 15.0 g/dL   HCT 23.0 (L) 36.0 - 46.0 %    Comment NOTIFIED PHYSICIAN   I-Stat arterial blood gas, ED     Status: Abnormal   Collection Time: 04/17/18  1:40 PM  Result Value Ref Range   pH, Arterial 7.243 (L) 7.350 - 7.450   pCO2 arterial 33.3 32.0 - 48.0 mmHg   pO2, Arterial 48.0 (L) 83.0 - 108.0 mmHg   Bicarbonate 14.4 (L) 20.0 - 28.0 mmol/L   TCO2 15 (L) 22 - 32 mmol/L   O2 Saturation 78.0 %   Acid-base deficit 12.0 (H) 0.0 - 2.0 mmol/L   Patient temperature 97.7 F    Collection site RADIAL, ALLEN'S TEST ACCEPTABLE    Drawn by Operator    Sample type ARTERIAL   .  ROS: unable to obtain with pt on Bipap, somewhat somnolent   Physical Exam: Vitals:   04/17/18 1310 04/17/18 1315  BP: (!) 190/92 114/87  Pulse: 89 88  Resp: (!) 22 20  SpO2: 100% 100%     In ER =  bp 178/74 ,  Pulse 91   100% O2 sat on BiPAP  General: Obese , AAF  In ER  On BiPAP ,NAD , Awoken from Sleep  HEENT: Francis , not icteric , MMdry Neck: mild JVD distention  Heart: RRR soft 1/6 sem , no rub or gallop Lungs: bibasilar rales / on Bipap   Abdomen: BS pos, obese, soft ,nt, ND,  Extremities: Left pedal; edeam 2+ , R pedal edema 1+  Skin: no overt rash or pedal ulcer Neuro:  Pos Asterixis/  Moves extrem to request,  Dialysis Access: Pos. bruit RUA AVF  Dialysis Orders: [facility]: [facility]  on TTS  . EDW 90kg HD Bath 1k, 2.5 ca  Time 4h  Access RUA AVFr , Heparin 4000.     Hec2 mcg IV/HD  Aranesp 200 q weekly        Kid center op med bp /binder  Amlodipine 5m hs    Coreg 6.266mbid  Renvela 3 ac and phoslo 1 ac   Assessment/Plan 1. Resp failure  On Bipap/with Volume overload 2/2 missed hd = UF today on hd  2. Uremia. Hyperkalemia  2/2 missed hd = hd today,  And tomor  3. ESRD -  Normal hd  schedule =TTS , hd as above  4. Hypertension/volume  - BP ^ sec  ^^ volume 5. Depression /Psy issue- Needs readdressed    6. Anemia  - Missed  EAS( aranesp 22576m on OP HD)   Aranesp 200 q weekly  7. Metabolic bone disease -   Hec on hd , binders 8. DM type 1-per admit 9. HO Orthostatic BP  Drop  2/2 diabetic Autonomic dz- not on midodrine   DavErnest HaberA-C CarGlen [phone]/16/2019, 2:48 PM   Pt seen, examined and agree w A/P as above. ESRD pt w/ recurrent issue of severe depression. HD today.  RobKelly Splinter CarNewell Rubbermaidger [phone]/16/2019, 4:44 PM

## 2018-04-17 NOTE — Progress Notes (Addendum)
Received pt from [place], RN/Dain, RN HD tx initiated via 15G x 2 w/o problem by Lesly Rubenstein, RN per report Pull/push/flush well w/o problem, per report VSS per report Will continue to monitor while on HD tx

## 2018-04-17 NOTE — Progress Notes (Addendum)
Paged by RN about blood on the sheets. Examined and evaluated patient at bedside. Observed soiled bed sheet with significant amount of bright red blood without feces. Per RN, patient came back from hemodialysis and noticed blood on bed sheet when she came back to the room. She mentions that on patient's commode afterwards visualized light brown stool, no obvious melena but significant amount of blood. Lauren Warner states she had similar episodes that began 5 days ago. Mentions that she noticed similiar episodes of bleeding observed on the toilet seat when she goes to the bathroom but denies any diarrhea or melena. Also endorsing RLQ abdominal pain onset around similar time with palpable 'lump's in RLQ. Denies any F/N/V/D/C. She mentions that she has not had any menstruation for about 8 years.  Vitals:   04/17/18 2315 04/18/18 0105  BP:  (!) 190/79  Pulse: 89   Resp: 16   Temp:    SpO2: 100%    Physical Exam  Constitutional: She is well-developed, well-nourished, and in no distress. No distress.  Eyes: Pupils are equal, round, and reactive to light. Conjunctivae and EOM are normal. No scleral icterus.  Neck: Normal range of motion. Neck supple. No JVD present.  Cardiovascular: Regular rhythm, normal heart sounds and intact distal pulses.  tachycardic  Pulmonary/Chest: Effort normal and breath sounds normal. She has no wheezes. She has no rales.  Abdominal: Soft. Bowel sounds are normal. She exhibits mass (barely palpable mass in RLQ). There is tenderness (RLQ tenderness to palpation). There is no rebound and no guarding.  Genitourinary: Vaginal discharge (Purewick covered with dried blood) found.  Genitourinary Comments: No obvious external hemorrhoids visualized, no internal hemorrhoids palpated. No blood on rectal exam. No obvious blood in vaginal canal. Adnexa difficult to palpate 2/2 body habitus  Musculoskeletal: Normal range of motion. She exhibits edema (bilateral lower extremity edema L>R).  She exhibits no tenderness.  Neurological:  Lethargic, but easily arousable. Follows commands.  Skin: She is not diaphoretic.   Postmenopausal bleeding vs GI bleed Found with blood on sheet overnight without clear source of bleed. Physical exam points to vaginal origin as most likely. Chart review reveals history of uterine fibroids observed on CT. Like any postmenopausal bleed, she will require further workup for malignancies but no indication for emergent work-up at this time. Patient is hypertensive but all other vitals stable.  - Trend CBC (Hgb stable at 7.9)  - F/u iron, tibc, ferritin - F/u FOBT - Consider pelvic ultrasound in AM - Referral to Ob/Gyn

## 2018-04-17 NOTE — Progress Notes (Signed)
HD tx completed @ 2100 w/elevated bp throughout tx that was non respondent to prn labetalol 57m IVP admin UF goal met Blood rinsed back VSS Report called to J[place] RTherapist, sports

## 2018-04-17 NOTE — Progress Notes (Signed)
Pt transported from dialysis to 0A00 without complications

## 2018-04-17 NOTE — Progress Notes (Signed)
RT note: RT and RN transported patient on BIPAP to Hemo. Vital signs stable through out.

## 2018-04-17 NOTE — ED Provider Notes (Signed)
[facility] EMERGENCY DEPARTMENT Provider Note   CSN: [id number] Arrival date & time: 04/17/18  1248     History   Chief Complaint Chief Complaint  Patient presents with  . Respiratory Distress    HPI Lauren Warner is a 52 y.o. female.  Pt presents to the ED today with sob and with SI.  The pt has a hx of ESRD on HD.  She has not had dialysis in 11 days according to EMS.  The transporter who came to pick her up for her dialysis today said she was having sob, so that person called EMS.  O2 sat 89% on RA upon EMS arrival.  EMS put pt immediately on cpap which has improved her sob.  The pt said she did not go to dialysis because she wants to die.  She tells me to just let her die because she is nothing.     Past Medical History:  Diagnosis Date  . Anemia   . Anxiety   . Asthma   . CHF (congestive heart failure) ([place])   . Coronary artery disease   . Daily headache   . Depression   . ESRD (end stage renal disease) on dialysis ([place])    "[facility]; TTS" (10/19/2017)  . High cholesterol   . History of blood transfusion 10/19/2017   "anemia"  . Hyperkalemia 10/2017  . Hypertension   . On home oxygen therapy    "2L; daily" (10/19/2017)  . Pneumonia    "several times" (10/19/2017)  . Pulmonary embolism ([place]) 2012  . Type 1 diabetes mellitus [facility])     Patient Active Problem List   Diagnosis Date Noted  . MDD (major depressive disorder), single episode, severe , no psychosis ([place])   . SOB (shortness of breath) 01/22/2018  . Respiratory failure ([place]) 01/21/2018  . Major depressive disorder, recurrent severe without psychotic features ([place])   . Acute encephalopathy 01/07/2018  . Suicidal ideation 01/07/2018  . Hypertensive encephalopathy 12/23/2017  . Hypertensive urgency 12/22/2017  . Syncope and collapse 12/03/2017  . Hyperkalemia 11/10/2017  . Pressure injury of skin 10/20/2017  . Essential hypertension 10/19/2017  . CAD (coronary artery disease)  10/19/2017  . ESRD (end stage renal disease) ([place]) 10/19/2017  . Syncope 10/19/2017  . Anemia due to end stage renal disease ([place]) 10/19/2017  . Closed left ankle fracture 10/19/2017  . Nausea vomiting and diarrhea 10/19/2017  . DM (diabetes mellitus), type 2 with renal complications ([place]) [birth year]16  . Acute respiratory failure with hypoxia ([place]) 10/18/2017    Past Surgical History:  Procedure Laterality Date  . AV FISTULA PLACEMENT Left 2018  . CESAREAN SECTION  1989  . CORONARY ANGIOPLASTY WITH STENT PLACEMENT  ~ 08/2017   "3 stents" (10/19/2017)  . ENDOMETRIAL ABLATION  ~ 2011     OB History   None      Home Medications    Prior to Admission medications   Medication Sig Start Date End Date Taking? Authorizing Provider  albuterol (PROAIR HFA) 108 (90 Base) MCG/ACT inhaler Inhale 2 puffs into the lungs every 6 (six) hours as needed for wheezing or shortness of breath. 12/04/17   Roxan Hockey, MD  amLODipine (NORVASC) 10 MG tablet Take 10 mg by mouth daily.  01/19/18   [provider]  aspirin EC 81 MG tablet Take 1 tablet (81 mg total) by mouth daily. With food 12/04/17   Roxan Hockey, MD  atomoxetine (STRATTERA) 40 MG capsule Take 40 mg by mouth daily.  [provider]  calcium acetate (PHOSLO) 667 MG capsule Take 1 capsule (667 mg total) by mouth 3 (three) times daily with meals. 12/04/17   Roxan Hockey, MD  carvedilol (COREG) 6.25 MG tablet Take 1 tablet (6.25 mg total) by mouth 2 (two) times daily with a meal. 12/04/17   Emokpae, Courage, MD  clopidogrel (PLAVIX) 75 MG tablet Take 1 tablet (75 mg total) by mouth daily. 12/04/17   Roxan Hockey, MD  escitalopram (LEXAPRO) 10 MG tablet Take 1 tablet (10 mg total) by mouth daily. 01/25/18   Bonnielee Haff, MD  gabapentin (NEURONTIN) 300 MG capsule Take 1 capsule (300 mg total) by mouth at bedtime. 01/25/18   Bonnielee Haff, MD  hydrALAZINE (APRESOLINE) 50 MG tablet Take 1 tablet (50 mg total) by mouth 3  (three) times daily. 03/04/18   Lavina Hamman, MD  hydrOXYzine (ATARAX/VISTARIL) 25 MG tablet Take 1 tablet (25 mg total) by mouth 3 (three) times daily as needed for anxiety. 03/08/18   Pucilowska, Jolanta B, MD  insulin lispro (HUMALOG) 100 UNIT/ML KiwkPen Inject 0.03 mLs (3 Units total) into the skin 3 (three) times daily. 01/25/18   Bonnielee Haff, MD  latanoprost (XALATAN) 0.005 % ophthalmic solution Place 1 drop into both eyes at bedtime. 12/04/17   Roxan Hockey, MD  lidocaine-prilocaine (EMLA) cream Apply 1 application topically as needed. 1 hour before dialysis 02/11/18   [provider]  multivitamin (RENA-VIT) TABS tablet Take 1 tablet by mouth at bedtime. 12/04/17   Roxan Hockey, MD  ondansetron (ZOFRAN) 4 MG tablet Take 1 tablet (4 mg total) by mouth every 8 (eight) hours as needed for nausea or vomiting. 12/04/17 12/04/18  Roxan Hockey, MD  QUEtiapine (SEROQUEL) 100 MG tablet Take 1 tablet (100 mg total) by mouth at bedtime. 03/08/18   Pucilowska, Herma Ard B, MD  sevelamer carbonate (RENVELA) 800 MG tablet Take 3 tablets (2,400 mg total) by mouth 3 (three) times daily with meals. 01/10/18   Robbie Lis, MD  traZODone (DESYREL) 100 MG tablet Take 1 tablet (100 mg total) by mouth at bedtime as needed for sleep. 03/08/18   PucilowskaWardell Honour, MD    Family History Family History  Problem Relation Age of Onset  . Stroke Sister     Social History Social History   Tobacco Use  . Smoking status: Never Smoker  . Smokeless tobacco: Never Used  Substance Use Topics  . Alcohol use: Never    Frequency: Never  . Drug use: Never     Allergies   Adhesive [tape]; Hydrocodone-acetaminophen; and Metformin and related   Review of Systems Review of Systems  Respiratory: Positive for shortness of breath.   Psychiatric/Behavioral: Positive for suicidal ideas.  All other systems reviewed and are negative.    Physical Exam Updated Vital Signs BP 114/87   Pulse 88    Resp 20   Wt 94.4 kg   SpO2 100%   BMI 32.60 kg/m   Physical Exam  Constitutional: She is oriented to person, place, and time. She appears well-developed and well-nourished. She appears distressed.  HENT:  Head: Normocephalic and atraumatic.  Right Ear: External ear normal.  Left Ear: External ear normal.  Nose: Nose normal.  Mouth/Throat: Oropharynx is clear and moist.  Eyes: Pupils are equal, round, and reactive to light. Conjunctivae and EOM are normal.  Neck: Normal range of motion. Neck supple.  Cardiovascular: Normal rate, regular rhythm, normal heart sounds and intact distal pulses.  Pulmonary/Chest: Tachypnea noted. She is  in respiratory distress.  Abdominal: Soft. Bowel sounds are normal.  Musculoskeletal: Normal range of motion.  Neurological: She is alert and oriented to person, place, and time.  Skin: Skin is warm. Capillary refill takes less than 2 seconds.  Psychiatric: She expresses suicidal ideation. She expresses suicidal plans.  Nursing note and vitals reviewed.    ED Treatments / Results  Labs (all labs ordered are listed, but only abnormal results are displayed) Labs Reviewed  BASIC METABOLIC PANEL - Abnormal; Notable for the following components:      Result Value   Potassium >7.5 (*)    CO2 14 (*)    BUN 161 (*)    Creatinine, Ser 17.64 (*)    Calcium 6.5 (*)    GFR calc non Af Amer 2 (*)    GFR calc Af Amer 2 (*)    Anion gap 20 (*)    All other components within normal limits  CBC WITH DIFFERENTIAL/PLATELET - Abnormal; Notable for the following components:   RBC 2.98 (*)    Hemoglobin 7.1 (*)    HCT 23.5 (*)    MCV 78.9 (*)    MCH 23.8 (*)    RDW 17.5 (*)    All other components within normal limits  I-STAT CHEM 8, ED - Abnormal; Notable for the following components:   Sodium 133 (*)    Potassium 7.5 (*)    BUN >140 (*)    Creatinine, Ser >18.00 (*)    Calcium, Ion 0.75 (*)    TCO2 16 (*)    Hemoglobin 7.8 (*)    HCT 23.0 (*)    All  other components within normal limits  I-STAT ARTERIAL BLOOD GAS, ED - Abnormal; Notable for the following components:   pH, Arterial 7.243 (*)    pO2, Arterial 48.0 (*)    Bicarbonate 14.4 (*)    TCO2 15 (*)    Acid-base deficit 12.0 (*)    All other components within normal limits  BRAIN NATRIURETIC PEPTIDE  I-STAT TROPONIN, ED    EKG EKG Interpretation  Date/Time:  Saturday April 17 2018 12:52:39 EST Ventricular Rate:  91 PR Interval:    QRS Duration: 98 QT Interval:  404 QTC Calculation: 498 R Axis:   30 Text Interpretation:  Sinus rhythm Borderline prolonged QT interval No significant change since last tracing Confirmed by Isla Pence [phone]) on 04/17/2018 1:02:51 PM   Radiology Dg Chest Port 1 View  Result Date: 04/17/2018 CLINICAL DATA:  Shortness of breath. EXAM: PORTABLE CHEST 1 VIEW COMPARISON:  03/03/2018 FINDINGS: Increased interstitial lung markings bilaterally. Findings are most compatible with interstitial pulmonary edema. Heart size is enlarged. The trachea is midline. Mild elevation of the right hemidiaphragm. Negative for a pneumothorax. IMPRESSION: Cardiomegaly with interstitial pulmonary edema. Electronically Signed   By: Markus Daft M.D.   On: 04/17/2018 13:44    Procedures Procedures (including critical care time)  Medications Ordered in ED Medications  calcium chloride 1 g in sodium chloride 0.9 % 100 mL IVPB (1 g Intravenous Not Given 04/17/18 1350)  calcium gluconate 10 % injection (has no administration in time range)  dextrose 50 % solution (has no administration in time range)  insulin aspart (novoLOG) 100 UNIT/ML injection (has no administration in time range)  calcium chloride 10 % injection (has no administration in time range)  insulin aspart (novoLOG) injection 5 Units (5 Units Intravenous Given 04/17/18 1358)    And  dextrose 50 % solution 50 mL (50 mLs Intravenous  Given 04/17/18 1358)  sodium bicarbonate injection 50 mEq (50 mEq  Intravenous Given 04/17/18 1404)  calcium chloride injection 1 g (1 g Intravenous Given 04/17/18 1352)     Initial Impression / Assessment and Plan / ED Course  I have reviewed the triage vital signs and the nursing notes.  Pertinent labs & imaging results that were available during my care of the patient were reviewed by me and considered in my medical decision making (see chart for details).  The pt was placed on bipap from EMS's cpap upon arrival.  Resp status has improved.  Pt does have hyperkalemia, so she was treated with IV bicarb, cacl, and insulin/glucose.  Hypocalcemia treated with IV calcium.  CXR shows pulmonary edema.  Pt d/w Dr. Jonnie Finner (nephrology) who will see her as she will need dialysis.  Pt does have SI which will have to be addressed when pt is medically cleared.  Pt d/w IMTS regarding admission.     CRITICAL CARE Performed by: Isla Pence   Total critical care time: 30 minutes  Critical care time was exclusive of separately billable procedures and treating other patients.  Critical care was necessary to treat or prevent imminent or life-threatening deterioration.  Critical care was time spent personally by me on the following activities: development of treatment plan with patient and/or surrogate as well as nursing, discussions with consultants, evaluation of patient's response to treatment, examination of patient, obtaining history from patient or surrogate, ordering and performing treatments and interventions, ordering and review of laboratory studies, ordering and review of radiographic studies, pulse oximetry and re-evaluation of patient's condition.  Final Clinical Impressions(s) / ED Diagnoses   Final diagnoses:  Acute on chronic congestive heart failure, unspecified heart failure type ([place])  Hyperkalemia  Noncompliance with renal dialysis ([place])  Hypocalcemia  Suicidal ideation  Anemia, unspecified type    ED Discharge Orders    None         Isla Pence, MD 04/17/18 1454

## 2018-04-18 DIAGNOSIS — G9340 Encephalopathy, unspecified: Secondary | ICD-10-CM

## 2018-04-18 DIAGNOSIS — G9341 Metabolic encephalopathy: Secondary | ICD-10-CM | POA: Insufficient documentation

## 2018-04-18 LAB — GLUCOSE, CAPILLARY
GLUCOSE-CAPILLARY: 75 mg/dL (ref 70–99)
GLUCOSE-CAPILLARY: 68 mg/dL — AB (ref 70–99)
GLUCOSE-CAPILLARY: 142 mg/dL — AB (ref 70–99)
GLUCOSE-CAPILLARY: 96 mg/dL (ref 70–99)

## 2018-04-18 LAB — CBC
HEMATOCRIT: 26.8 % — AB (ref 36.0–46.0)
Hemoglobin: 7.9 g/dL — ABNORMAL LOW (ref 12.0–15.0)
MCH: 22.7 pg — ABNORMAL LOW (ref 26.0–34.0)
MCHC: 29.5 g/dL — AB (ref 30.0–36.0)
MCV: 77 fL — AB (ref 80.0–100.0)
NRBC: 0 % (ref 0.0–0.2)
PLATELETS: 205 10*3/uL (ref 150–400)
RBC: 3.48 MIL/uL — ABNORMAL LOW (ref 3.87–5.11)
RDW: 17 % — AB (ref 11.5–15.5)
WBC: 4.3 10*3/uL (ref 4.0–10.5)

## 2018-04-18 LAB — BLOOD GAS, ARTERIAL
Acid-base deficit: 2 mmol/L (ref 0.0–2.0)
Bicarbonate: 22.6 mmol/L (ref 20.0–28.0)
DRAWN BY: 301361
FIO2: 28
O2 Saturation: 98.9 %
PATIENT TEMPERATURE: 98.9
pCO2 arterial: 40.9 mmHg (ref 32.0–48.0)
pH, Arterial: 7.362 (ref 7.350–7.450)
pO2, Arterial: 135 mmHg — ABNORMAL HIGH (ref 83.0–108.0)

## 2018-04-18 LAB — SALICYLATE LEVEL: Salicylate Lvl: <7 mg/dL (ref 2.8–30.0)

## 2018-04-18 LAB — ACETAMINOPHEN LEVEL

## 2018-04-18 LAB — IRON AND TIBC
Iron: 62 ug/dL (ref 28–170)
Saturation Ratios: 34 % — ABNORMAL HIGH (ref 10.4–31.8)
TIBC: 185 ug/dL — ABNORMAL LOW (ref 250–450)
UIBC: 123 ug/dL

## 2018-04-18 LAB — MRSA PCR SCREENING: MRSA by PCR: NEGATIVE

## 2018-04-18 LAB — FERRITIN: FERRITIN: 1025 ng/mL — AB (ref 11–307)

## 2018-04-18 MED ORDER — LABETALOL HCL 5 MG/ML IV SOLN: 10.0000 mg | Freq: Once | INTRAVENOUS | Status: DC

## 2018-04-18 MED ORDER — AMLODIPINE BESYLATE 10 MG PO TABS
10.0000 mg | ORAL_TABLET | Freq: Every day | ORAL | Status: DC
  Administered 2018-04-18 – 2018-04-23 (×6): 10 mg via ORAL
  Filled 2018-04-18 (×6): qty 1

## 2018-04-18 MED ORDER — DEXTROSE 50 % IV SOLN
INTRAVENOUS | Status: AC
  Administered 2018-04-18: 50 mL
  Filled 2018-04-18: qty 50

## 2018-04-18 MED ORDER — CHLORHEXIDINE GLUCONATE 0.12 % MT SOLN
15.0000 mL | Freq: Two times a day (BID) | OROMUCOSAL | Status: DC
  Administered 2018-04-18 – 2018-04-23 (×11): 15 mL via OROMUCOSAL
  Filled 2018-04-18 (×11): qty 15

## 2018-04-18 MED ORDER — HEPARIN SODIUM (PORCINE) 1000 UNIT/ML DIALYSIS
4000.0000 [IU] | Freq: Once | INTRAMUSCULAR | Status: AC
  Administered 2018-04-18: 4000 [IU] via INTRAVENOUS_CENTRAL
  Filled 2018-04-18: qty 4

## 2018-04-18 MED ORDER — CHLORHEXIDINE GLUCONATE CLOTH 2 % EX PADS
6.0000 | MEDICATED_PAD | Freq: Every day | CUTANEOUS | Status: DC
  Administered 2018-04-19 – 2018-04-23 (×5): 6 via TOPICAL

## 2018-04-18 MED ORDER — HEPARIN SODIUM (PORCINE) 1000 UNIT/ML IJ SOLN
INTRAMUSCULAR | Status: AC
  Administered 2018-04-18: 4000 [IU] via INTRAVENOUS_CENTRAL
  Filled 2018-04-18: qty 4

## 2018-04-18 MED ORDER — ORAL CARE MOUTH RINSE
15.0000 mL | Freq: Two times a day (BID) | OROMUCOSAL | Status: DC
  Administered 2018-04-18 – 2018-04-22 (×5): 15 mL via OROMUCOSAL

## 2018-04-18 NOTE — Progress Notes (Signed)
RT obtained ABG from patient. RT was only able to obtain enough blood to run the sample on the istat machine and when patient's are not in ICU the results will not crossover. RT has called RN with the results and attempted to page IM resident.  The results are as follows:  PH-7.44 PCO2- 35.3 PO2-124 HCO3-24.2

## 2018-04-18 NOTE — Consult Note (Signed)
Patient is lethargic and uncooperative with psychiatric evaluation. She responds to simple verbal  commands but quickly fall back asleep. Re-consult psych service when patient is alert and awake.  Corena Pilgrim, MD Attending psychiatrist

## 2018-04-18 NOTE — Progress Notes (Signed)
   04/18/18 0900  Clinical Encounter Type  Visited With Patient;Health care provider  Visit Type Psychological support;Spiritual support  Responded to [facility] consult for depressed patient. Per sitter patient was lethargic and not able to have conversation at this time. Provided the ministry of [facility] and silent prayer. Will follow-up as requested.

## 2018-04-18 NOTE — Progress Notes (Signed)
I was notified by [place] RN regarding her patient having a mental status change.  She asked for a second opinion.  Ms. Lupien was lying in the bed, arousable to physical stimuli and could not stay awake.  Her responses to simple questions were very slow but appropriate.  She could move all 4 extremities with the same level of marked weakness equally.  No focal neurological deficit.  Dr. Truman Hayward was notified of change and stated he would come see the patient. Will continue to monitor.

## 2018-04-18 NOTE — Progress Notes (Signed)
Patient in dialysis at this time. BIPAP on standy by in room.  Per RN patient was on RA at shift change with no distress.

## 2018-04-18 NOTE — Progress Notes (Signed)
0445: Pt with mental status change. Difficult to arouse and delayed responses. Jerky movement and facial tremors noted on examination. No focal deficits noted. LNW [id number].  RRT RN Shanon Brow notified and asked to come to unit to give a second opinion.   Dr. Alain Marion notified of patient mental status change.   0530: Dr. Truman Hayward on unit to assess patient. Instructed to continue to monitor patient but no new orders at this time.    Will continue to monitor.

## 2018-04-18 NOTE — Progress Notes (Signed)
[facility] Progress Note  Subjective: had HD last night, K+/BUN/Cr better today.  Still very lethargic/ somnolent.   Vitals:   04/18/18 0600 04/18/18 0700 04/18/18 0715 04/18/18 0745  BP: (!) 176/81 140/69  (!) 159/75  Pulse: 95 84 83 83  Resp: 18 19 19 16   Temp:    98.6 F (37 C)  TempSrc:    Oral  SpO2: 93% (!) 88% 100% 100%  Weight:        Inpatient medications: . amLODipine  10 mg Oral Daily  . aspirin EC  81 mg Oral Daily  . atomoxetine  40 mg Oral Daily  . carvedilol  6.25 mg Oral BID WC  . chlorhexidine  15 mL Mouth Rinse BID  . clopidogrel  75 mg Oral Daily  . [START ON 04/24/2018] darbepoetin (ARANESP) injection - DIALYSIS  200 mcg Intravenous Q Sat-HD  . [START ON 04/20/2018] doxercalciferol  2 mcg Intravenous Q T,Th,Sa-HD  . escitalopram  10 mg Oral Daily  . gabapentin  300 mg Oral QHS  . heparin  5,000 Units Subcutaneous Q8H  . insulin aspart  0-5 Units Subcutaneous QHS  . insulin aspart  0-9 Units Subcutaneous TID WC  . mouth rinse  15 mL Mouth Rinse q12n4p  . QUEtiapine  100 mg Oral QHS    acetaminophen **OR** acetaminophen, albuterol, labetalol, ondansetron **OR** ondansetron (ZOFRAN) IV, traZODone  Iron/TIBC/Ferritin/ %Sat    Component Value Date/Time   IRON 62 04/18/2018 0210   TIBC 185 (L) 04/18/2018 0210   FERRITIN 1,025 (H) 04/18/2018 0210   IRONPCTSAT 34 (H) 04/18/2018 0210    Exam: General: very lethargic, barely arousable, not in distress, VSS Heart: RRR soft 1/6 sem , no rub or gallop Lungs: CTA bilat, no rales or wheezing, nasal O2 Abdomen: BS pos, obese, soft ,nt, ND,  Extremities: Left pedal; edeam 2+ , R pedal edema 1+  Skin: no overt rash or pedal ulcer Neuro: awakens briefly then falls back to sleep Dialysis Access: Pos. bruit RUA AVF  Dialysis: [facility] TTS 4h  1K/2.5Ca bath  90kg  RUA AVF  Hep 4000 - Hec2 mcg IV/HD  - Aranesp 200 q weekly      [facility] op med bp /binder >> Amlodipine 10mg  hs    Coreg  6.25mg  bid  Renvela 3 ac and phoslo 1 ac   Assessment/Plan 1. Uremia - still lethargic, suspect all uremia, will plan HD later tonight, at latest 1st shift tomorrow am.  2. Resp distress/ pulm edema - d/t vol overload, improved on nasal O2  3. ESRD - HD TTS is reg schedule 4. Hypertension/volume - cont BP meds, 2kg up today 5. Depression /Psy issue- recurrent suicidal issues 6. Anemia  - Missed ESA ( aranesp 274mcg  on OP HD)   Aranesp 200 q weekly  7. Metabolic bone disease -   Hec on hd , binders 8. DM type 1-per admit    Kelly Splinter MD [place] pager [phone]   04/18/2018, 11:55 AM   Recent Labs  Lab 04/17/18 1308 04/17/18 1317 04/18/18 0210  NA 136 133* 139  K >7.5* 7.5* 4.0  CL 102 109 100  CO2 14*  --  20*  GLUCOSE 72 70 75  BUN 161* >140* 77*  CREATININE 17.64* >18.00* 11.11*  CALCIUM 6.5*  --  7.6*  PHOS  --   --  6.5*  ALBUMIN  --   --  3.2*   No results for input(s): AST, ALT, ALKPHOS,  BILITOT, PROT in the last 168 hours. Recent Labs  Lab 04/17/18 1308 04/17/18 1317 04/18/18 0012  WBC 5.3  --  4.3  NEUTROABS 4.0  --   --   HGB 7.1* 7.8* 7.9*  HCT 23.5* 23.0* 26.8*  MCV 78.9*  --  77.0*  PLT 185  --  205

## 2018-04-18 NOTE — Progress Notes (Signed)
Went to evaluate patient again after rounds. She remains lethargic/somnolent. Per sitter she briefly woke up when her cell phone rang but then fell back asleep. She is minimally improved since this AM. She will follow simple commands but quickly falls back asleep. Remains hemodynamically stable and is protecting her airway. Right now this still appears to be medication vs metabolic induced. Spoke with Nephrology and they will try to get her in for HD today but at the latest would be tomorrow AM.

## 2018-04-18 NOTE — Progress Notes (Signed)
   Subjective: Patient is very somnolent this AM. She is arousable to physical and verbal stimuli. She thinks she is in [place]. She does appear to be protecting her airway, she is hemodynamically stable, and ABG illustrated she is oxygenating and ventilating appropriately. This may be medication induced. We will discuss with nephrology to see if she will be able to go to HD today. I will re-evaluate the patient this afternoon.   Objective: Vital signs in last 24 hours: Vitals:   04/18/18 0600 04/18/18 0700 04/18/18 0715 04/18/18 0745  BP: (!) 176/81 140/69  (!) 159/75  Pulse: 95 84 83 83  Resp: 18 19 19 16   Temp:    98.6 F (37 C)  TempSrc:    Oral  SpO2: 93% (!) 88% 100% 100%  Weight:       General: Obese female in no acute distress Pulm: Diminished air movement CV: RRR, no murmurs, no rubs  Abdomen: Soft, non-distended, no tenderness to palpation  Neuro: Arousable to physical and verbal stimuli  Assessment/Plan:  Lauren Warner is a 52 y.o female with type 1 DM, HTN, HFpEF, and ESRD who was brought to the ED via EMS for respiratory distress in the setting of missed HD 2/2 suicidal ideation. Work-up was significant for pulmonary vascular congestion, metabolic acidosis, and hyperkalemia. The patient was subsequently admitted for further evaluation and management.   Encephalopathy, likely toxin induced - Arousable to physical and verbal stimuli - She does appear to be protecting her airway, she is hemodynamically stable, and ABG illustrated she is oxygenating and ventilating appropriately.  - Likely medications induced - Re-evaluate this afternoon.   Hypervolemia  Hyperkalemia w/associated EKG changes. (Resolved) ESRD on HD TTS - Per nephro patient has missed 5 outpatient HD sessions 2/2 suicidal ideation. Similar admissions in the past.  - Labs improved s/p HD on 11/16, but continues to have a HAGMA - Off BiPAP and saturating well on RA - Appreciate nephrology  consultation  MDD with SI - Recurrent admissions for similar presentations  - Will continue Lexapro, Seroquel, Trazodone, and Strattera  - Consult psych today  Vaginal vs GI Bleed - Night team notified about blood on patient sheets. Felt to be 2/2 vaginal bleeding  - Hgb stable at ~7-8 - Will need outpatient OB/GYN referral  - F/u FOBT  Hypertension  Autonomic dysfunction 2/2 long standing DM  - Outpatient regimen includes hydralazine, amlodipine, and coreg  - Will continue amlodipine and coreg  - Only treat standing Bps  Type 1 DM  - Outpatient regimen Humalog 3 units TID WC  - Sensitive SSI while inpatient   Diet: Renal diet VTE ppx: SubQ Heparin  CODE STATUS: Not asked 2/2 admission for SI. Will default to full code until psych has evaluated the patient.   Dispo: Anticipated discharge in approximately 3-4 day(s) pending psychiatry evaluation and serial HD.  [name], MD 04/18/2018, 10:13 AM

## 2018-04-18 NOTE — Progress Notes (Signed)
HD tx initiated via 15Gx2 w/o problem Pull/push/flush well w/o problem VSS Will continue to monitor while on HD tx 

## 2018-04-19 ENCOUNTER — Other Ambulatory Visit: Payer: Self-pay

## 2018-04-19 ENCOUNTER — Encounter (HOSPITAL_COMMUNITY): Payer: Self-pay

## 2018-04-19 DIAGNOSIS — F332 Major depressive disorder, recurrent severe without psychotic features: Secondary | ICD-10-CM

## 2018-04-19 DIAGNOSIS — E109 Type 1 diabetes mellitus without complications: Secondary | ICD-10-CM

## 2018-04-19 DIAGNOSIS — Z598 Other problems related to housing and economic circumstances: Secondary | ICD-10-CM

## 2018-04-19 DIAGNOSIS — I10 Essential (primary) hypertension: Secondary | ICD-10-CM

## 2018-04-19 DIAGNOSIS — G9341 Metabolic encephalopathy: Secondary | ICD-10-CM

## 2018-04-19 LAB — POCT I-STAT 3, ART BLOOD GAS (G3+)
BICARBONATE: 24.2 mmol/L (ref 20.0–28.0)
O2 SAT: 99 %
TCO2: 25 mmol/L (ref 22–32)
pCO2 arterial: 35.3 mmHg (ref 32.0–48.0)
pH, Arterial: 7.443 (ref 7.350–7.450)
pO2, Arterial: 124 mmHg — ABNORMAL HIGH (ref 83.0–108.0)

## 2018-04-19 LAB — RENAL FUNCTION PANEL
ALBUMIN: 3.2 g/dL — AB (ref 3.5–5.0)
ANION GAP: 19 — AB (ref 5–15)
ANION GAP: 13 (ref 5–15)
Albumin: 2.9 g/dL — ABNORMAL LOW (ref 3.5–5.0)
BUN: 77 mg/dL — ABNORMAL HIGH (ref 6–20)
BUN: 30 mg/dL — ABNORMAL HIGH (ref 6–20)
CALCIUM: 7.6 mg/dL — AB (ref 8.9–10.3)
CALCIUM: 7.5 mg/dL — AB (ref 8.9–10.3)
CO2: 20 mmol/L — AB (ref 22–32)
CO2: 24 mmol/L (ref 22–32)
CREATININE: 11.11 mg/dL — AB (ref 0.44–1.00)
Chloride: 100 mmol/L (ref 98–111)
Chloride: 99 mmol/L (ref 98–111)
Creatinine, Ser: 5.94 mg/dL — ABNORMAL HIGH (ref 0.44–1.00)
GFR calc Af Amer: 9 mL/min — ABNORMAL LOW (ref >60)
GFR calc non Af Amer: 4 mL/min — ABNORMAL LOW (ref >60)
GFR calc non Af Amer: 7 mL/min — ABNORMAL LOW (ref >60)
GFR, EST AFRICAN AMERICAN: 4 mL/min — AB (ref >60)
GLUCOSE: 75 mg/dL (ref 70–99)
GLUCOSE: 79 mg/dL (ref 70–99)
PHOSPHORUS: 6.5 mg/dL — AB (ref 2.5–4.6)
POTASSIUM: 3.6 mmol/L (ref 3.5–5.1)
Phosphorus: 4.2 mg/dL (ref 2.5–4.6)
Potassium: 4 mmol/L (ref 3.5–5.1)
Sodium: 139 mmol/L (ref 135–145)
Sodium: 136 mmol/L (ref 135–145)

## 2018-04-19 LAB — GLUCOSE, CAPILLARY
GLUCOSE-CAPILLARY: 70 mg/dL (ref 70–99)
Glucose-Capillary: 123 mg/dL — ABNORMAL HIGH (ref 70–99)
Glucose-Capillary: 129 mg/dL — ABNORMAL HIGH (ref 70–99)
Glucose-Capillary: 62 mg/dL — ABNORMAL LOW (ref 70–99)
Glucose-Capillary: 70 mg/dL (ref 70–99)

## 2018-04-19 MED ORDER — POLYETHYLENE GLYCOL 3350 17 G PO PACK
17.0000 g | PACK | Freq: Every day | ORAL | Status: DC
  Filled 2018-04-19 (×5): qty 1

## 2018-04-19 MED ORDER — SENNOSIDES-DOCUSATE SODIUM 8.6-50 MG PO TABS
1.0000 | ORAL_TABLET | Freq: Two times a day (BID) | ORAL | Status: DC
  Administered 2018-04-20 – 2018-04-22 (×5): 1 via ORAL
  Filled 2018-04-19 (×10): qty 1

## 2018-04-19 NOTE — Progress Notes (Addendum)
Nutrition Brief Note  Patient identified on the Malnutrition Screening Tool (MST) Report.  Wt Readings from Last 15 Encounters:  04/19/18 97.3 kg  03/04/18 92.5 kg  02/17/18 88.5 kg  02/16/18 93.9 kg  01/23/18 95.2 kg  01/10/18 94.8 kg  12/24/17 94 kg  11/12/17 96.2 kg   Body mass index is 33.6 kg/m. Patient meets criteria for Obesity Class II based on current BMI.   Current diet order is Renal with 1200 ml fluid restriction, patient is consuming approximately 60% of meals at this time.   Labs and medications reviewed. No skin breakdown identified. No nutrition interventions warranted at this time.  If nutrition issues arise, please consult RD.   Arthur Holms, RD, LDN Pager #: [phone] After-Hours Pager #: [phone]
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
## 2018-04-19 NOTE — Procedures (Signed)
Patient seen and examined on Hemodialysis. QB 400 via AVF, UF goal 2.5L.  Doing OK on treatment so far- just started  Treatment adjusted as needed.  Madelon Lips MD [facility] pgr [phone] 3:17 PM

## 2018-04-19 NOTE — Progress Notes (Signed)
   Subjective: Lauren Warner was seen and evaluated at bedside on morning rounds. No acute events overnight. Patient is more alert today and able to answer questions. She feels her breathing has improved. Denies abdominal pain, nausea or vomiting and is requesting food.  Today she is very tearful and anxious. Perseverating on trying to figure out her living situation because she is being evicted from current apartment. Reports the reason she missed dialysis the last 2 weeks was due to trying to find a place to live or roommate to split her rent with. No active SI.    Objective:  Vital signs in last 24 hours: Vitals:   04/19/18 0000 04/19/18 0019 04/19/18 0044 04/19/18 0358  BP: (!) 185/87 (!) 194/98 (!) 199/91 (!) 166/78  Pulse: 89 93 96 89  Resp: 17 18 14 13   Temp:  98.1 F (36.7 C) 98.4 F (36.9 C) 98.9 F (37.2 C)  TempSrc:  Oral Oral Oral  SpO2: 100% 100% 100% 100%  Weight:  97.3 kg     General: awake, alert, lying in bed in NAD CV: RRR; SEM over LUSB Pulm: normal respiratory effort; diminished breath sounds but no obvious wheezing or crackles Abd: Soft, non-distended, non-tender Neuro: alert and oriented; follows commands and answering questions appropriately   Assessment/Plan:   Principal Problem:   Encephalopathy, toxic Active Problems:   ESRD (end stage renal disease) ([place])   DM (diabetes mellitus), type 2 with renal complications ([place])   Hyperkalemia   Suicidal ideation  Lauren Warner is a 52 y.o female with type 1 DM, HTN, HFpEF, and ESRD who was brought to the ED via EMS for respiratory distress in the setting of missed HD 2/2 suicidal ideation. Work-up was significant for pulmonary vascular congestion, metabolic acidosis, and hyperkalemia. The patient was subsequently admitted for further evaluation and management.   Encephalopathy, metabolic - patient much improved after 2 HD sessions - she is alert and oriented on exam today - PT eval recommending SNF placement;  will await psych eval to determine further disposition planning   Hypervolemia  Hyperkalemia w/associated EKG changes. (Resolved) ESRD on HD TTS - Per nephro patient has missed 5 outpatient HD sessions 2/2 suicidal ideation. Similar admissions in the past.  - AGMA and hyperkalemia have resolved after 2 dialysis sessions  - Off BiPAP and saturating well on nasal canula - Appreciate nephrology consultation  MDD with SI - Recurrent admissions for similar presentations; however today patient denies purposefully missing HD to end her life - have re-consulted psych now that she is more alert  - have also consulted social work to help with difficult living situation  - Will continue Lexapro, Seroquel, Trazodone, and Strattera   Hypertension  Autonomic dysfunction 2/2 long standing DM  - Outpatient regimen includes hydralazine, amlodipine, and coreg  - Will continue amlodipine and coreg  - Only treat standing Bps; can add home hydralazine if elevated   Type 1 DM  - Outpatient regimen Humalog 3 units TID WC  - Sensitive SSI while inpatient  - CBGs low this morning; have ordered diet and will continue to monitor   Dispo: Anticipated discharge in approximately 2-3 day(s).   [name], DO 04/19/2018, 6:40 AM Pager: [phone]

## 2018-04-19 NOTE — Evaluation (Signed)
Physical Therapy Evaluation Patient Details Name: Lauren Warner MRN: [id number] DOB: 05-22-1966 Today's Date: 04/19/2018   History of Present Illness  52yo female brought to ED in respiratory distress in setting of missed HD, reports suicidal ideations and this is why she purposefully missed HD. Admitted for hyperkalemia with EKG changes, hypervolemia, respiratory distress. PMH anxiety, CHF, ESRD on HD, HTN, home O2 use, hx PE, DM   Clinical Impression   Patient received in bed, initially attempting to decline PT however agreeable with encouragement from RN. Note she is very anxious and impulsive today, easily upset and so interview/history of PLOF and equipment limited. Also note she reports she is NWB L LE due to fracture of 2 weeks ago, PT unable to confirm this in chart and found conflicting information where patient stated she was putting full weight down on her L LE so unsure of true WB status; nevertheless errored on side of caution and attempted NWB L LE during session. She is able to perform bed mobility with S and transfers with MinA for balance/Max cues for safety and steadying, very unsafe with Max cues to maintain NWB precautions and reaching/twisting impulsively in RW. She began to have BM when up standing and panicked requiring reassurance from both PT and nurse tech. Nurse tech assisted in pericare and Economist today. Able to gait train approximately 40f with RW and MinA for balance/Max cues for WB status and safety to chair, patient unable to maintain her reported WB precautions despite Max cues from therapist. She was left up in the chair with all needs met, nurse sitter present this morning. She will continue to benefit from skilled PT services in the acute setting as well as ongoing services in the ST-SNF setting moving forward.     Follow Up Recommendations SNF    Equipment Recommendations  Other (comment)(defer to next venue )    Recommendations for Other Services  Speech consult     Precautions / Restrictions Precautions Precautions: Fall;Other (comment) Precaution Comments: suicidal ideations  Restrictions Weight Bearing Restrictions: Yes LLE Weight Bearing: Non weight bearing Other Position/Activity Restrictions: NWB is per patient's report, unable to confirm in chart/EPIC (she reports this precaution started 2 weeks ago due to fracture) so errored on side of caution       Mobility  Bed Mobility Overal bed mobility: Needs Assistance Bed Mobility: Supine to Sit     Supine to sit: Supervision     General bed mobility comments: S for safety and line management   Transfers Overall transfer level: Needs assistance Equipment used: Rolling walker (2 wheeled) Transfers: Sit to/from Stand Sit to Stand: Min assist         General transfer comment: MinA to boost to full standing position, extreme difficulty maintaining her self-reported NWB precautions L LE   Ambulation/Gait Ambulation/Gait assistance: Min assist Gait Distance (Feet): 3 Feet Assistive device: Rolling walker (2 wheeled) Gait Pattern/deviations: Step-to pattern;Trunk flexed Gait velocity: decreased    General Gait Details: hop-to pattern with RW with MinA for balance/Max cues from therapist to maintain NWB precautions. Extremely unsafe and impulsive with RW and often breaks precautions despite therapist cues. Twists and turns with RW and tries to sit where there is no seat.   Stairs            Wheelchair Mobility    Modified Rankin (Stroke Patients Only)       Balance Overall balance assessment: Needs assistance Sitting-balance support: Bilateral upper extremity supported;Feet supported Sitting balance-Leahy Scale: Good  Standing balance support: Bilateral upper extremity supported;During functional activity Standing balance-Leahy Scale: Poor                               Pertinent Vitals/Pain Pain Assessment: Faces Pain Score: 0-No  pain Faces Pain Scale: No hurt Pain Intervention(s): Limited activity within patient's tolerance;Monitored during session    [place] expects to be discharged to:: Private residence Living Arrangements: Alone   Type of Home: Apartment Home Access: Level entry     Home Layout: One level Home Equipment: Walker - 4 wheels;Shower seat Additional Comments: very anxious and easily upset so history limited, took majority of history from chart review. She does state she is being evicted from her current apartment and is not sure where she'll go.     Prior Function Level of Independence: Independent with assistive device(s)         Comments: uses RW, uses cart in grocery store. Per HER report, she has fracture L LE and is NWB on this side but cannot confirm in chart/EPIC      Hand Dominance   Dominant Hand: Right    Extremity/Trunk Assessment   Upper Extremity Assessment Upper Extremity Assessment: Defer to OT evaluation    Lower Extremity Assessment Lower Extremity Assessment: Generalized weakness    Cervical / Trunk Assessment Cervical / Trunk Assessment: Normal  Communication   Communication: No difficulties  Cognition Arousal/Alertness: Awake/alert Behavior During Therapy: Anxious;Flat affect Overall Cognitive Status: Impaired/Different from baseline Area of Impairment: Attention;Memory;Following commands;Problem solving;Awareness;Safety/judgement                   Current Attention Level: Selective Memory: Decreased recall of precautions Following Commands: Follows one step commands consistently;Follows one step commands with increased time Safety/Judgement: Decreased awareness of safety Awareness: Intellectual Problem Solving: Slow processing;Decreased initiation;Difficulty sequencing;Requires verbal cues General Comments: cannot maintain L LE NWB precautions, very anxious and mumbling, panicks easily       General Comments       Exercises     Assessment/Plan    PT Assessment Patient needs continued PT services  PT Problem List Decreased strength;Decreased cognition;Decreased knowledge of use of DME;Decreased activity tolerance;Decreased safety awareness;Decreased balance;Decreased knowledge of precautions;Decreased mobility;Decreased coordination       PT Treatment Interventions DME instruction;Balance training;Gait training;Neuromuscular re-education;Stair training;Functional mobility training;Patient/family education;Therapeutic activities;Therapeutic exercise;Manual techniques    PT Goals (Current goals can be found in the Care Plan section)  Acute Rehab PT Goals PT Goal Formulation: Patient unable to participate in goal setting    Frequency Min 2X/week   Barriers to discharge Other (comment) does not have stable home situation    Co-evaluation               AM-PAC PT "6 Clicks" Daily Activity  Outcome Measure Difficulty turning over in bed (including adjusting bedclothes, sheets and blankets)?: A Little Difficulty moving from lying on back to sitting on the side of the bed? : A Little Difficulty sitting down on and standing up from a chair with arms (e.g., wheelchair, bedside commode, etc,.)?: A Lot Help needed moving to and from a bed to chair (including a wheelchair)?: A Lot Help needed walking in hospital room?: A Lot Help needed climbing 3-5 steps with a railing? : Total 6 Click Score: 13    End of Session Equipment Utilized During Treatment: Gait belt Activity Tolerance: Patient tolerated treatment well Patient left: in chair;with call bell/phone within  reach;with nursing/sitter in room Nurse Communication: Mobility status PT Visit Diagnosis: Unsteadiness on feet (R26.81);Difficulty in walking, not elsewhere classified (R26.2);Other abnormalities of gait and mobility (R26.89);Muscle weakness (generalized) (M62.81)    Time: 2683-4196 PT Time Calculation (min) (ACUTE ONLY): 33  min   Charges:   PT Evaluation $PT Eval Moderate Complexity: 1 Mod PT Treatments $Therapeutic Activity: 8-22 mins        Deniece Ree PT, DPT, CBIS  Supplemental Physical Therapist [place]    Pager [phone] Acute Rehab Office [phone]
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
## 2018-04-19 NOTE — Progress Notes (Signed)
HD tx completed @ 0000 w/o problem UF goal met Blood rinsed back VSS w/ increased bp Report called to Dub Mikes, Therapist, sports

## 2018-04-20 LAB — GLUCOSE, CAPILLARY
GLUCOSE-CAPILLARY: 105 mg/dL — AB (ref 70–99)
Glucose-Capillary: 93 mg/dL (ref 70–99)
Glucose-Capillary: 100 mg/dL — ABNORMAL HIGH (ref 70–99)
Glucose-Capillary: 114 mg/dL — ABNORMAL HIGH (ref 70–99)

## 2018-04-20 LAB — RENAL FUNCTION PANEL
Albumin: 3 g/dL — ABNORMAL LOW (ref 3.5–5.0)
Anion gap: 10 (ref 5–15)
BUN: 15 mg/dL (ref 6–20)
CO2: 27 mmol/L (ref 22–32)
Calcium: 8 mg/dL — ABNORMAL LOW (ref 8.9–10.3)
Chloride: 97 mmol/L — ABNORMAL LOW (ref 98–111)
Creatinine, Ser: 3.64 mg/dL — ABNORMAL HIGH (ref 0.44–1.00)
GFR calc Af Amer: 16 mL/min — ABNORMAL LOW
GFR calc non Af Amer: 14 mL/min — ABNORMAL LOW
Glucose, Bld: 124 mg/dL — ABNORMAL HIGH (ref 70–99)
Phosphorus: 4.1 mg/dL (ref 2.5–4.6)
Potassium: 4.4 mmol/L (ref 3.5–5.1)
Sodium: 134 mmol/L — ABNORMAL LOW (ref 135–145)

## 2018-04-20 MED ORDER — GABAPENTIN 100 MG PO CAPS
100.0000 mg | ORAL_CAPSULE | Freq: Every day | ORAL | Status: DC
  Administered 2018-04-20 – 2018-04-23 (×4): 100 mg via ORAL
  Filled 2018-04-20 (×4): qty 1

## 2018-04-20 MED ORDER — DOXERCALCIFEROL 4 MCG/2ML IV SOLN
INTRAVENOUS | Status: AC
  Filled 2018-04-20: qty 2

## 2018-04-20 NOTE — Procedures (Signed)
Patient seen and examined on Hemodialysis. QB 400 via AVF, UF goal 2.5L.  This is HD #4 in a row- serial rx needed for vol overload and uremia.  Treatment adjusted as needed.  Madelon Lips MD [facility] pgr [phone] 1:10 PM

## 2018-04-20 NOTE — Progress Notes (Signed)
Patient remains on 3L [place]. No distress noted. BIPAP not required since 11/17 a.m. Equipment removed from room.

## 2018-04-20 NOTE — Progress Notes (Addendum)
   Subjective: Ms. Tangen was seen and evaluated at bedside on morning rounds. No acute events overnight. Patient continues to feel her breathing has improved. Denies chest pain, abdominal, n/v. Discussed psychiatry recommendations for inpatient psych which she is amenable to but does not wish to go back to the facility she was in when she voluntarily committed herself. Explained that there are very limited options in terms of inpatient psychiatric care and hemodialysis, and the important thing is preventing her from being a danger to herself.    Objective:  Vital signs in last 24 hours: Vitals:   04/20/18 0357 04/20/18 0730 04/20/18 1145 04/20/18 1202  BP: (!) 154/74 (!) 148/73 126/67 (!) 142/30  Pulse: 68 66 73 60  Resp: 17 17 14    Temp: 98.2 F (36.8 C) 97.8 F (36.6 C) 98.1 F (36.7 C)   TempSrc: Oral Oral Oral   SpO2: 100% 100% 100%   Weight:   95.4 kg    General: awake, alert lying in bed in NAD CV: RRR; SEM over LUSB Pulm: normal respiratory effort; decreased breath sounds throughout; bibasilar crackles  Abd: soft, non-tender, non-distended Psych: tearful; depressed mood   Assessment/Plan:  Principal Problem:   MDD (major depressive disorder), recurrent severe, without psychosis ([place]) Active Problems:   ESRD (end stage renal disease) ([place])   DM (diabetes mellitus), type 2 with renal complications ([place])   Hyperkalemia   Suicidal ideation   Encephalopathy, metabolic  Lauren Warner is a 52 y.o female with type 1 DM, HTN, HFpEF, and ESRD who was brought to the ED via EMS for respiratory distress in the setting of missed HD 2/2 suicidal ideation. Work-up was significant for pulmonary vascular congestion, metabolic acidosis, and hyperkalemia. The patient was subsequently admitted for further evaluation and management.   Encephalopathy, metabolic (resolved)  - patient is A&Ox3 and mentating at baseline since resuming dialysis   Hypervolemia  Hyperkalemia w/associated EKG  changes. (Resolved) ESRD on HD TTS - Per nephro patient has missed 5 outpatient HD sessions 2/2 suicidal ideation.Similar admissions in the past.  -potassium stable at 4.4 - still approximately 5 kg over EDW; nephrology plans on another HD session today to get back on normal schedule  -Off BiPAPand saturating well on 3 L nasal canula; can wean as tolerated to maintain O2 sat > 88% - Appreciate nephrology consultation  MDD with SI - Recurrent admissions for similar presentations; however today patient denies purposefully missing HD to end her life - appreciate psych evaluation; recommending inpatient psych; consult has been placed to social work to assist with placement  - Will continue Lexapro, Seroquel, Trazodone, and Strattera   Hypertension  Autonomic dysfunction 2/2 long standing DM  - Outpatient regimen includes hydralazine, amlodipine, and coreg  - Will continue amlodipine and coreg  - Only treat standing Bps; can add home hydralazine if elevated   Type 1 DM  - Outpatient regimen Humalog 3 units TID WC  - Sensitive SSI while inpatient  - CBGs stable   Dispo: Anticipated discharge pending inpatient psych placement.   Lauren Nunnery D, DO 04/20/2018, 1:26 PM Pager: [phone]
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
## 2018-04-21 DIAGNOSIS — M79605 Pain in left leg: Secondary | ICD-10-CM

## 2018-04-21 LAB — GLUCOSE, CAPILLARY
GLUCOSE-CAPILLARY: 128 mg/dL — AB (ref 70–99)
GLUCOSE-CAPILLARY: 115 mg/dL — AB (ref 70–99)
Glucose-Capillary: 130 mg/dL — ABNORMAL HIGH (ref 70–99)
Glucose-Capillary: 182 mg/dL — ABNORMAL HIGH (ref 70–99)

## 2018-04-21 MED ORDER — LIDOCAINE 5 % EX PTCH
1.0000 | MEDICATED_PATCH | CUTANEOUS | Status: DC
  Administered 2018-04-21 – 2018-04-23 (×4): 1 via TRANSDERMAL
  Filled 2018-04-21 (×3): qty 1

## 2018-04-21 MED ORDER — DICLOFENAC SODIUM 1 % TD GEL
2.0000 g | Freq: Four times a day (QID) | TRANSDERMAL | Status: DC
  Administered 2018-04-21 – 2018-04-23 (×7): 2 g via TOPICAL
  Filled 2018-04-21: qty 100

## 2018-04-21 NOTE — Plan of Care (Signed)
Discussed plan of care for the evening with patient.  Pain management as well as mental health were also discussed.  Patient is struggling with depression because of the death of her daughter.

## 2018-04-21 NOTE — Progress Notes (Addendum)
   Subjective: Ms. Bochicchio was seen and evaluated at bedside on morning rounds. No acute events overnight. Got 4th serial HD session yesterday. Complains of left leg pain that Tylenol has not helped much. No chest pain, shortness of breath, abdominal pain, n/v.   Objective:  Vital signs in last 24 hours: Vitals:   04/21/18 0000 04/21/18 0300 04/21/18 0336 04/21/18 0754  BP: 115/65 125/69 115/65 122/61  Pulse: 72 74 74 67  Resp: 14 12 14 16   Temp: 98 F (36.7 C)  98 F (36.7 C) 99 F (37.2 C)  TempSrc: Oral  Oral Oral  SpO2: 100% 96%  91%  Weight:   100.4 kg   Height:   5\' 7"  (1.702 m)    General: awake, alert, lying in bed in NAD CV: RRR; SEM over LUSB  Pulm: normal respiratory effort; saturating well on room air; decreased breath sounds throughout    Assessment/Plan:  Principal Problem:   MDD (major depressive disorder), recurrent severe, without psychosis ([place]) Active Problems:   ESRD (end stage renal disease) ([place])   DM (diabetes mellitus), type 2 with renal complications ([place])   Hyperkalemia   Suicidal ideation   Encephalopathy, metabolic  Cherese Lozano is a 52 y.o female with type 1 DM, HTN, HFpEF, and ESRD who was brought to the ED via EMS for respiratory distress in the setting of missed HD 2/2 suicidal ideation. Work-up was significant for pulmonary vascular congestion, metabolic acidosis, and hyperkalemia. The patient was subsequently admitted for further evaluation and management.   Encephalopathy,metabolic due to missed HD  (resolved)  -patient is A&Ox3 and mentating at baseline since resuming dialysis   Hypervolemia  Hyperkalemia w/associated EKG changes. (Resolved) ESRD on HD TTS - Per nephro patient has missed 5 outpatient HD sessions 2/2 suicidal ideation.Similar admissions in the past.  -electrolytes and hyperuremia have resolved after 4 serial HD sessions -respiratory status much improved and is saturating well on room air  - [place] nephrology  consultation for HD management   MDD with SI - Recurrent admissions for similar presentations; however today patient denies purposefully missing HD to end her life - psych has recommended inpatient psychiatric care  - currently working with CSW to find facility that also offers HD  - Will continue Lexapro, Seroquel, Trazodone, and Strattera   Hypertension  Autonomic dysfunction 2/2 long standing DM  - Outpatient regimen includes hydralazine, amlodipine, and coreg  - Will continue amlodipine and coreg  - blood pressures currently stable after serial HD - Only treat standing Bps; can add home hydralazine if elevated  Type 1 DM  - Outpatient regimen Humalog 3 units TID WC  - Sensitive SSI while inpatient - CBGs stable   Dispo: Patient medically stable for discharge to inpatient psych when bed available.   [name], DO 04/21/2018, 11:59 AM Pager: [phone]

## 2018-04-21 NOTE — Discharge Summary (Signed)
Name: Lauren Warner MRN: [id number] DOB: 1966/02/13 52 y.o. PCP: [name], [name]  Date of Admission: 04/17/2018 12:48 PM Date of Discharge: 04/21/2018 Attending Physician: No att. providers found  Discharge Diagnosis: Acute Metabolic Encephalopathy due to missed HD 2/2 suicidal ideation.    Discharge Medications: Allergies as of 04/23/2018      Reactions   Adhesive [tape] Other (See Comments)   "RIPS MY SKIN," SO PLEASE USE COBAN WRAP!!   Hydrocodone-acetaminophen Other (See Comments)   "Makes me feel crazy"   Metformin And Related Other (See Comments)   "Makes me feel crazy"      Medication List    TAKE these medications   albuterol 108 (90 Base) MCG/ACT inhaler Commonly known as:  PROVENTIL HFA;VENTOLIN HFA Inhale 2 puffs into the lungs every 6 (six) hours as needed for wheezing or shortness of breath.   amLODipine 10 MG tablet Commonly known as:  NORVASC Take 10 mg by mouth daily.   aspirin EC 81 MG tablet Take 1 tablet (81 mg total) by mouth daily. With food   atomoxetine 40 MG capsule Commonly known as:  STRATTERA Take 40 mg by mouth daily.   calcium acetate 667 MG capsule Commonly known as:  PHOSLO Take 1 capsule (667 mg total) by mouth 3 (three) times daily with meals.   carvedilol 6.25 MG tablet Commonly known as:  COREG Take 1 tablet (6.25 mg total) by mouth 2 (two) times daily with a meal.   clopidogrel 75 MG tablet Commonly known as:  PLAVIX Take 1 tablet (75 mg total) by mouth daily.   escitalopram 10 MG tablet Commonly known as:  LEXAPRO Take 1 tablet (10 mg total) by mouth daily.   gabapentin 300 MG capsule Commonly known as:  NEURONTIN Take 1 capsule (300 mg total) by mouth at bedtime.   hydrALAZINE 50 MG tablet Commonly known as:  APRESOLINE Take 1 tablet (50 mg total) by mouth 3 (three) times daily.   hydrOXYzine 25 MG tablet Commonly known as:  ATARAX/VISTARIL Take 1 tablet (25 mg total) by mouth 3 (three) times  daily as needed for anxiety.   insulin lispro 100 UNIT/ML KiwkPen Commonly known as:  HUMALOG Inject 0.03 mLs (3 Units total) into the skin 3 (three) times daily.   latanoprost 0.005 % ophthalmic solution Commonly known as:  XALATAN Place 1 drop into both eyes at bedtime.   lidocaine-prilocaine cream Commonly known as:  EMLA Apply 1 application topically as needed. 1 hour before dialysis   multivitamin Tabs tablet Take 1 tablet by mouth at bedtime.   ondansetron 4 MG tablet Commonly known as:  ZOFRAN Take 1 tablet (4 mg total) by mouth every 8 (eight) hours as needed for nausea or vomiting.   QUEtiapine 100 MG tablet Commonly known as:  SEROQUEL Take 1 tablet (100 mg total) by mouth at bedtime.   sevelamer carbonate 800 MG tablet Commonly known as:  RENVELA Take 3 tablets (2,400 mg total) by mouth 3 (three) times daily with meals.   traZODone 100 MG tablet Commonly known as:  DESYREL Take 1 tablet (100 mg total) by mouth at bedtime as needed for sleep.     Disposition and follow-up:   [name] was discharged from [facility] in Stable condition.  At the hospital follow up visit please address:  1.  MDD w/SI. Ensure she is taking her medication as prescribed and refer her for outpatient counseling once ready for discharge from inpatient psychiatry.  2.  Labs / imaging needed at time of follow-up: None  3.  Pending labs/ test needing follow-up: None  Follow-up Appointments: Follow-up Information    Kathrynn Humble Brennan Bailey, [name] Follow up.   Specialty:  Internal Medicine Contact information: [address] [place] [address] [phone]        [facility] Follow up.   Specialty:  [place] Why:  [facility], HHPT, HHAIDE Contact information: [address] [place] [address] [phone]         Hospital Course by problem list:  Acute Metabolic Encephalopathy due to missed HD  2/2 suicidal ideation.  Lauren Warner is a 52 y.o female with type 1 DM, HTN, HFpEF, and ESRD who was brought to the ED via EMS for respiratory distress in the setting of missed HD 2/2 suicidal ideation. Work-up was significant for pulmonary vascular congestion, metabolic acidosis, and hyperkalemia. The patient was subsequently admitted for further evaluation and management. Over the course of her hospitalization she underwent serial HD for 4 sessions before resuming her regular outpatient HD schedule (TTS). Her mental status returned to baseline and she was medically stable. Psychiatry evaluated the patient and felt that she would benefit from inpatient psychiatric hospitalization. Recommended continuation of Lexapro 10 mg daily for depression, Seroquel 100 mg qhs for mood stabilization, Trazodone 100 mg qhs PRN for insomnia and Strattera 40 mg daily for ADHD. After a couple days waiting on placement to an inpatient psychiatry facility, psychiatry reevaluated the patient and felt that she was stable for discharge home. The patient declined further rehab at a skilled nursing facility and elected for home health. She was discharged in stable condition. No other medication were changed during her hospitalization.   Discharge Vitals:   BP 111/70 (BP Location: Right Arm)   Pulse 71   Temp 98.1 F (36.7 C) (Oral)   Resp 16   Ht 5\' 7"  (1.702 m)   Wt 92 kg   SpO2 99%   BMI 31.77 kg/m   Discharge Instructions: Discharge Instructions    Diet - low sodium heart healthy   Complete by:  As directed    Face-to-face encounter (required for Medicare/Medicaid patients)   Complete by:  As directed    I [name] certify that this patient is under my care and that I, or a nurse practitioner or physician's assistant working with me, had a face-to-face encounter that meets the physician face-to-face encounter requirements with this patient on 04/23/2018. The encounter with the patient was in whole, or in part  for the following medical condition(s) which is the primary reason for home health care (List medical condition): ESRD, Left ankle fracture, gait instability   The encounter with the patient was in whole, or in part, for the following medical condition, which is the primary reason for home health care:  ESRD, deconditioning, gait instability   I certify that, based on my findings, the following services are medically necessary home health services:  Physical therapy   Reason for Medically Necessary Home Health Services:  Therapy- Personnel officer, Public librarian   My clinical findings support the need for the above services:   Unable to leave home safely without assistance and/or assistive device Unsafe ambulation due to balance issues     Further, I certify that my clinical findings support that this patient is homebound due to:  Ambulates short distances less than 300 feet   Home Health   Complete by:  As directed    To provide the following care/treatments:   PT [place] work     Increase activity slowly   Complete by:  As directed     Signed: [name], MD 04/25/2018, 10:52 AM

## 2018-04-21 NOTE — Progress Notes (Signed)
Patient IVC'd today and will expire 04/27/2018. CSW was advised by [facility] she not appropriate candidate for their facility do to her needing oxygen. CSW will discuss case with Surveyor, quantity.   Thurmond Butts, [place] Social Worker [phone]
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
## 2018-04-22 LAB — CBC
HEMATOCRIT: 26.8 % — AB (ref 36.0–46.0)
Hemoglobin: 7.9 g/dL — ABNORMAL LOW (ref 12.0–15.0)
MCH: 23.7 pg — ABNORMAL LOW (ref 26.0–34.0)
MCHC: 29.5 g/dL — AB (ref 30.0–36.0)
MCV: 80.2 fL (ref 80.0–100.0)
Platelets: 274 10*3/uL (ref 150–400)
RBC: 3.34 MIL/uL — ABNORMAL LOW (ref 3.87–5.11)
RDW: 16.4 % — AB (ref 11.5–15.5)
WBC: 4.4 10*3/uL (ref 4.0–10.5)
nRBC: 0 % (ref 0.0–0.2)

## 2018-04-22 LAB — RENAL FUNCTION PANEL
ALBUMIN: 3 g/dL — AB (ref 3.5–5.0)
Anion gap: 11 (ref 5–15)
BUN: 31 mg/dL — AB (ref 6–20)
CALCIUM: 8.5 mg/dL — AB (ref 8.9–10.3)
CO2: 26 mmol/L (ref 22–32)
CREATININE: 7.13 mg/dL — AB (ref 0.44–1.00)
Chloride: 97 mmol/L — ABNORMAL LOW (ref 98–111)
GFR calc Af Amer: 7 mL/min — ABNORMAL LOW (ref >60)
GFR, EST NON AFRICAN AMERICAN: 6 mL/min — AB (ref >60)
GLUCOSE: 174 mg/dL — AB (ref 70–99)
PHOSPHORUS: 6.2 mg/dL — AB (ref 2.5–4.6)
Potassium: 4 mmol/L (ref 3.5–5.1)
SODIUM: 134 mmol/L — AB (ref 135–145)

## 2018-04-22 LAB — GLUCOSE, CAPILLARY
GLUCOSE-CAPILLARY: 147 mg/dL — AB (ref 70–99)
GLUCOSE-CAPILLARY: 113 mg/dL — AB (ref 70–99)
Glucose-Capillary: 179 mg/dL — ABNORMAL HIGH (ref 70–99)
Glucose-Capillary: 122 mg/dL — ABNORMAL HIGH (ref 70–99)

## 2018-04-22 MED ORDER — ONDANSETRON HCL 4 MG/2ML IJ SOLN
INTRAMUSCULAR | Status: AC
  Administered 2018-04-22: 4 mg via INTRAVENOUS
  Filled 2018-04-22: qty 2

## 2018-04-22 MED ORDER — DOXERCALCIFEROL 4 MCG/2ML IV SOLN
INTRAVENOUS | Status: AC
  Administered 2018-04-22: 2 ug via INTRAVENOUS
  Filled 2018-04-22: qty 2

## 2018-04-22 NOTE — Progress Notes (Signed)
   Subjective: Ms. Delucchi was seen and evaluated at bedside. No acute events overnight. She complains of nausea this morning but no vomiting. The lidocaine patch helped her left leg pain significantly. Denies headaches, chest pain, shortness of breath. Discussed plan to continue HD as scheduled.    Objective:  Vital signs in last 24 hours: Vitals:   04/21/18 1900 04/21/18 2300 04/22/18 0500 04/22/18 0807  BP: 125/75 (!) 141/73 (!) 155/80 130/73  Pulse: 69 70 68 66  Resp: 18 18 18 17   Temp: 98.2 F (36.8 C) 98.4 F (36.9 C) 98.6 F (37 C) 98.4 F (36.9 C)  TempSrc: Oral Oral Oral Axillary  SpO2: 100% 99% 100% 98%  Weight:      Height:       General: awake, alert, lying in bed in NAD CV: RRR; SEM over LUSB Pulm: normal respiratory effort; saturating on room air; good air movement throughout  Ext: no edema  Neuro: A&Ox3; following commands appropriately   Assessment/Plan:  Principal Problem:   MDD (major depressive disorder), recurrent severe, without psychosis ([place]) Active Problems:   ESRD (end stage renal disease) ([place])   DM (diabetes mellitus), type 2 with renal complications ([place])   Hyperkalemia   Suicidal ideation   Encephalopathy, metabolic  Gaytha Raybourn is a 52 y.o female with type 1 DM, HTN, HFpEF, and ESRD who was brought to the ED via EMS for respiratory distress in the setting of missed HD 2/2 suicidal ideation. Work-up was significant for pulmonary vascular congestion, metabolic acidosis, and hyperkalemia. The patient was subsequently admitted for further evaluation and management.   MDD with SI - Recurrent admissions for similar presentations -psych has recommended inpatient psychiatric care; IVC'd yesterday, as patient was refusing to go  - CSW currently working on placement  - Will continue Lexapro, Seroquel, Trazodone, and Strattera   Encephalopathy,metabolic due to missed HD (resolved) -patientis A&Ox3 and mentating at baseline since resuming  dialysis  Hypervolemia  Hyperkalemia w/associated EKG changes. (Resolved) ESRD on HD TTS - Per nephro patient has missed 5 outpatient HD sessions 2/2 suicidal ideation.Similar admissions in the past.  -electrolytes and hyperuremia have resolved after 4 serial HD sessions -will continue HD management per nephrology; appreciate their recommendations - respiratory status much improved and is no longer requiring oxygen supplementation   Hypertension  Autonomic dysfunction 2/2 long standing DM  - Outpatient regimen includes hydralazine, amlodipine, and coreg  - Will continue amlodipine and coreg  - blood pressures currently stable  - Only treat standing Bps; can add home hydralazine if elevated  Type 1 DM  - Outpatient regimen Humalog 3 units TID WC  - Sensitive SSI while inpatient - CBGsstable  Dispo: Patient is medically stable for discharge to inpatient psych. Of note, patient does not requiring oxygen supplementation so placement should not be limited by this.   [name], DO 04/22/2018, 8:33 AM Pager: [phone]
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
## 2018-04-22 NOTE — Progress Notes (Signed)
CSW talked with the patient at bedside. Patient was alert and oriented. Patient discussed that she was loosing her housing on 04/25/2018. CSW offered to contact her apartment manager but the patient refused. Patient states it did not matter because the apartment manager did not care, they were only concerned with getting their money. Patient begin to get a little aggrivated and states she just wants to go home because she has to get her belongings out of her apartment.  Patient decline going voluntarily for [facility] treatment. Patient upset that she was going to inpatient [facility] facility and requested out patient treatment. CSW explained due to possibly harming herself, it has determined she will need inpatient psyc treatment. Patient reports she has not had any thoughts lately but  expressed in other countries, if a person wants to die they can.  Thurmond Butts, [place] Social Worker [phone]

## 2018-04-22 NOTE — Care Management Note (Signed)
Case Management Note  Patient Details  Name: Lauren Warner MRN: [id number] Date of Birth: Dec 06, 1965  Subjective/Objective:   From home alone, missed HD sessions secondary to suicidal ideation, encephalopathy, awaiting psych inpatient bed, CSW following.                 Action/Plan: DC to Psych inpatient when bed available.   Expected Discharge Date:                  Expected Discharge Plan:  Psychiatric Hospital  In-House Referral:  Clinical Social Work  Discharge planning Services  CM Consult  Post Acute Care Choice:    Choice offered to:     DME Arranged:    DME Agency:     HH Arranged:    [place] Agency:     Status of Service:  In process, will continue to follow  If discussed at Long Length of Stay Meetings, dates discussed:    Additional Comments:  Zenon Mayo, RN 04/22/2018, 9:32 AM

## 2018-04-23 LAB — GLUCOSE, CAPILLARY
GLUCOSE-CAPILLARY: 123 mg/dL — AB (ref 70–99)
GLUCOSE-CAPILLARY: 155 mg/dL — AB (ref 70–99)
Glucose-Capillary: 124 mg/dL — ABNORMAL HIGH (ref 70–99)

## 2018-04-23 MED ORDER — SEVELAMER CARBONATE 800 MG PO TABS
2400.0000 mg | ORAL_TABLET | Freq: Three times a day (TID) | ORAL | Status: DC
  Administered 2018-04-23: 2400 mg via ORAL
  Filled 2018-04-23: qty 3

## 2018-04-23 NOTE — Care Management Note (Addendum)
Case Management Note  Patient Details  Name: Lauren Warner MRN: [id number] Date of Birth: 03/10/1966  Subjective/Objective:     From home alone, missed HD sessions secondary to suicidal ideation, encephalopathy, awaiting psych inpatient bed, CSW following.   11/22 Tomi Bamberger RN, BSN- patient has been cleared by psych, she is refusing snf per CSW, wants to go home with [facility] services, HHRN, HHPT, HHAIDE, NCM offered choice, she chose [facility], referral made to [facility], soc will begin 24-48 hrs post dc.  She states she also has pcs services but not sure how many hours she has, she will contact her pcp to find out.    She will need cab voucher to go home, RN, to contact CSW.  CSW contacted and will provide cab voucher.  Ortho tech to bring patient a boot as well.                             Action/Plan: DC home when ready.  Will need Orders with face to face.   Expected Discharge Date:                  Expected Discharge Plan:  [place]  In-House Referral:  Clinical Social Work  Discharge planning Services  CM Consult  Post Acute Care Choice:  Home Health Choice offered to:  Patient  DME Arranged:    DME Agency:     HH Arranged:  RN, PT, Nurse's Aide [place] Agency:  [place]  Status of Service:  Completed, signed off  If discussed at [place] of Stay Meetings, dates discussed:    Additional Comments:  Zenon Mayo, RN 04/23/2018, 4:13 PM

## 2018-04-23 NOTE — Progress Notes (Signed)
Patient being escorted out of department at this time with NT via wheelchair for discharge. To meet [employer] services at entrance of ED utilizing cab voucher provided by SW
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
## 2018-04-23 NOTE — Progress Notes (Signed)
Orthopedic Tech Progress Note Patient Details:  Lauren Warner 1965/07/31 [id number]  Ortho Devices Type of Ortho Device: Postop shoe/boot Ortho Device/Splint Interventions: Application   Post Interventions Patient Tolerated: Well Instructions Provided: Care of device   [place] Pink 04/23/2018, 6:29 PM

## 2018-04-23 NOTE — Progress Notes (Addendum)
Patient ID: Lauren Warner, female   DOB: 05/20/1966, 52 y.o.   MRN: [id number]  Received chart from TTS for review for consideration of transfer to [facility] inpatient psychiatric unit. Pt does not appear appropriate for our unit at this time.  Pt is known to the unit from recent previous admission. Per chart review, Pt is no longer endorsing active SI and may no longer meet criteria for inpatient admission. Furthermore, pt is a fall risk requiring 1:1 while on our unit and there is not adequate staff on our unit at this time. Pt also requires SNF placement per PT which will need to be done from a medical floor as it is extremely difficult to be placed at a SNF directly from inpatient behavioral health unit. Would consider to continue psychiatric treatment and placement referrals to a SNFdirectly from the medical floor. Recommend re-consulting psychiatry while on the medical floor to determine if inpatient admission is still warranted.

## 2018-04-23 NOTE — Discharge Instructions (Signed)
Thank you for allowing [place] to provide your care. Please follow-up with your doctor as soon as possible. Continue all your medications as prescribed. We have sent in a referral for home health physical therapy, social work, and an Engineer, production.

## 2018-04-23 NOTE — Progress Notes (Signed)
   Subjective: Ms. Essner was seen and evaluated at bedside. No acute events overnight. Continues to complain of some intermittent nausea but no vomiting. Left leg pain well controlled with lidocaine patch. Denies headache, chest pain or shortness of breath. Tolerated HD well yesterday. Discussed that she is doing well from a medical standpoint and just waiting on bed availability at a facility.   Objective:  Vital signs in last 24 hours: Vitals:   04/22/18 1530 04/22/18 1600 04/22/18 2328 04/23/18 0722  BP: (!) 73/47 110/74 132/77 (!) 146/76  Pulse: (!) 58 71 67 70  Resp:  16    Temp:  98.7 F (37.1 C) 98.3 F (36.8 C) 98.3 F (36.8 C)  TempSrc:  Oral Oral Oral  SpO2:  100% 100% 100%  Weight:  92 kg    Height:       General: awake, alert, sitting up in bed in NAD CV: RRR; SEM at LUSB Pulm: normal respiratory effort; saturating well on room air; good air movement throughout Abd: soft, non-distended, non-tender  Assessment/Plan:  Principal Problem:   MDD (major depressive disorder), recurrent severe, without psychosis ([place]) Active Problems:   ESRD (end stage renal disease) ([place])   DM (diabetes mellitus), type 2 with renal complications ([place])   Hyperkalemia   Suicidal ideation   Encephalopathy, metabolic  Trishia Cuthrell is a 52 y.o female with type 1 DM, HTN, HFpEF, and ESRD who was brought to the ED via EMS for respiratory distress in the setting of missed HD 2/2 suicidal ideation. Work-up was significant for pulmonary vascular congestion, metabolic acidosis, and hyperkalemia. The patient was subsequently admitted for further evaluation and management.   MDD with SI - Recurrent admissions for similar presentations -psych has recommended inpatient psychiatric care; IVC'd 11/20 which is in effect until 11/26 - CSW currently working on placement  - Will continue Lexapro, Seroquel, Trazodone, and Strattera   Encephalopathy,metabolicdue to missed HD(resolved) -patientis  A&Ox3 and mentating at baseline since resuming dialysis  Hypervolemia  Hyperkalemia w/associated EKG changes. (Resolved) ESRD on HD TTS - Per nephro patient has missed 5 outpatient HD sessions 2/2 suicidal ideation.Similar admissions in the past.  -electrolytes and hyperuremia have resolved after 4 serial HD sessions - volume status has also improved; no longer requiring oxygen supplementation and is down to 2 kg above EDW -will continue HD management per nephrology; appreciate their recommendations   Hypertension  Autonomic dysfunction 2/2 long standing DM  - Outpatient regimen includes hydralazine, amlodipine, and coreg  - Will continue amlodipine and coreg  -blood pressures currently stable  -Only treat standing Bps; can add home hydralazine if elevated  Type 1 DM  - Outpatient regimen Humalog 3 units TID WC  - Sensitive SSI while inpatient - CBGsstable  Dispo: Patient is medically stable for discharge when inpatient psych bed is available.   [name], DO 04/23/2018, 11:33 AM Pager: [phone]

## 2018-04-23 NOTE — Progress Notes (Addendum)
CSW rescinded IVC.   CSW received update that patient has been cleared by psychiatry to return home. CSW will provide cab voucher as patient has no way home and is unable to take the bus.   Percell Locus Taijon Vink LCSW [phone]

## 2019-03-09 ENCOUNTER — Emergency Department (HOSPITAL_COMMUNITY): Payer: Medicare Other

## 2019-03-09 ENCOUNTER — Encounter (HOSPITAL_COMMUNITY): Payer: Self-pay | Admitting: Emergency Medicine

## 2019-03-09 ENCOUNTER — Inpatient Hospital Stay (HOSPITAL_COMMUNITY)
Admission: EM | Admit: 2019-03-09 | Discharge: 2019-03-24 | DRG: 304 | Disposition: A | Discharge status: Home Health | Payer: Medicare Other | Attending: Internal Medicine | Admitting: Internal Medicine

## 2019-03-09 ENCOUNTER — Other Ambulatory Visit: Payer: Self-pay

## 2019-03-09 DIAGNOSIS — Z20828 Contact with and (suspected) exposure to other viral communicable diseases: Secondary | ICD-10-CM | POA: Diagnosis present

## 2019-03-09 DIAGNOSIS — I161 Hypertensive emergency: Secondary | ICD-10-CM | POA: Diagnosis not present

## 2019-03-09 DIAGNOSIS — I2510 Atherosclerotic heart disease of native coronary artery without angina pectoris: Secondary | ICD-10-CM | POA: Diagnosis present

## 2019-03-09 DIAGNOSIS — Z23 Encounter for immunization: Secondary | ICD-10-CM

## 2019-03-09 DIAGNOSIS — D696 Thrombocytopenia, unspecified: Secondary | ICD-10-CM | POA: Diagnosis present

## 2019-03-09 DIAGNOSIS — Z9114 Patient's other noncompliance with medication regimen: Secondary | ICD-10-CM

## 2019-03-09 DIAGNOSIS — G546 Phantom limb syndrome with pain: Secondary | ICD-10-CM | POA: Diagnosis present

## 2019-03-09 DIAGNOSIS — E1129 Type 2 diabetes mellitus with other diabetic kidney complication: Secondary | ICD-10-CM | POA: Diagnosis present

## 2019-03-09 DIAGNOSIS — E1022 Type 1 diabetes mellitus with diabetic chronic kidney disease: Secondary | ICD-10-CM | POA: Diagnosis present

## 2019-03-09 DIAGNOSIS — Z590 Homelessness: Secondary | ICD-10-CM

## 2019-03-09 DIAGNOSIS — E669 Obesity, unspecified: Secondary | ICD-10-CM | POA: Diagnosis present

## 2019-03-09 DIAGNOSIS — D72829 Elevated white blood cell count, unspecified: Secondary | ICD-10-CM | POA: Diagnosis present

## 2019-03-09 DIAGNOSIS — E7800 Pure hypercholesterolemia, unspecified: Secondary | ICD-10-CM | POA: Diagnosis present

## 2019-03-09 DIAGNOSIS — G4700 Insomnia, unspecified: Secondary | ICD-10-CM | POA: Diagnosis present

## 2019-03-09 DIAGNOSIS — Z79899 Other long term (current) drug therapy: Secondary | ICD-10-CM

## 2019-03-09 DIAGNOSIS — E1121 Type 2 diabetes mellitus with diabetic nephropathy: Secondary | ICD-10-CM

## 2019-03-09 DIAGNOSIS — Z7982 Long term (current) use of aspirin: Secondary | ICD-10-CM

## 2019-03-09 DIAGNOSIS — R45851 Suicidal ideations: Secondary | ICD-10-CM | POA: Diagnosis present

## 2019-03-09 DIAGNOSIS — N186 End stage renal disease: Secondary | ICD-10-CM

## 2019-03-09 DIAGNOSIS — J9601 Acute respiratory failure with hypoxia: Secondary | ICD-10-CM | POA: Diagnosis present

## 2019-03-09 DIAGNOSIS — I132 Hypertensive heart and chronic kidney disease with heart failure and with stage 5 chronic kidney disease, or end stage renal disease: Secondary | ICD-10-CM | POA: Diagnosis present

## 2019-03-09 DIAGNOSIS — F331 Major depressive disorder, recurrent, moderate: Secondary | ICD-10-CM | POA: Diagnosis present

## 2019-03-09 DIAGNOSIS — F419 Anxiety disorder, unspecified: Secondary | ICD-10-CM | POA: Diagnosis present

## 2019-03-09 DIAGNOSIS — J810 Acute pulmonary edema: Secondary | ICD-10-CM | POA: Diagnosis not present

## 2019-03-09 DIAGNOSIS — N2581 Secondary hyperparathyroidism of renal origin: Secondary | ICD-10-CM | POA: Diagnosis present

## 2019-03-09 DIAGNOSIS — F909 Attention-deficit hyperactivity disorder, unspecified type: Secondary | ICD-10-CM | POA: Diagnosis present

## 2019-03-09 DIAGNOSIS — R0789 Other chest pain: Secondary | ICD-10-CM | POA: Diagnosis present

## 2019-03-09 DIAGNOSIS — Z992 Dependence on renal dialysis: Secondary | ICD-10-CM

## 2019-03-09 DIAGNOSIS — Z823 Family history of stroke: Secondary | ICD-10-CM

## 2019-03-09 DIAGNOSIS — E785 Hyperlipidemia, unspecified: Secondary | ICD-10-CM | POA: Diagnosis present

## 2019-03-09 DIAGNOSIS — Z89612 Acquired absence of left leg above knee: Secondary | ICD-10-CM

## 2019-03-09 DIAGNOSIS — Z794 Long term (current) use of insulin: Secondary | ICD-10-CM

## 2019-03-09 DIAGNOSIS — Z89512 Acquired absence of left leg below knee: Secondary | ICD-10-CM

## 2019-03-09 DIAGNOSIS — Z86711 Personal history of pulmonary embolism: Secondary | ICD-10-CM

## 2019-03-09 DIAGNOSIS — Z955 Presence of coronary angioplasty implant and graft: Secondary | ICD-10-CM

## 2019-03-09 DIAGNOSIS — D631 Anemia in chronic kidney disease: Secondary | ICD-10-CM | POA: Diagnosis present

## 2019-03-09 DIAGNOSIS — E875 Hyperkalemia: Secondary | ICD-10-CM | POA: Diagnosis present

## 2019-03-09 DIAGNOSIS — E8889 Other specified metabolic disorders: Secondary | ICD-10-CM | POA: Diagnosis present

## 2019-03-09 DIAGNOSIS — I5033 Acute on chronic diastolic (congestive) heart failure: Secondary | ICD-10-CM | POA: Diagnosis present

## 2019-03-09 DIAGNOSIS — T7611XA Adult physical abuse, suspected, initial encounter: Secondary | ICD-10-CM | POA: Diagnosis present

## 2019-03-09 DIAGNOSIS — Z6832 Body mass index (BMI) 32.0-32.9, adult: Secondary | ICD-10-CM

## 2019-03-09 DIAGNOSIS — I160 Hypertensive urgency: Secondary | ICD-10-CM | POA: Diagnosis present

## 2019-03-09 DIAGNOSIS — Z7902 Long term (current) use of antithrombotics/antiplatelets: Secondary | ICD-10-CM

## 2019-03-09 LAB — BASIC METABOLIC PANEL
Anion gap: 19 — ABNORMAL HIGH (ref 5–15)
BUN: 80 mg/dL — ABNORMAL HIGH (ref 6–20)
CO2: 26 mmol/L (ref 22–32)
Calcium: 8.8 mg/dL — ABNORMAL LOW (ref 8.9–10.3)
Chloride: 92 mmol/L — ABNORMAL LOW (ref 98–111)
Creatinine, Ser: 7.71 mg/dL — ABNORMAL HIGH (ref 0.44–1.00)
GFR calc Af Amer: 6 mL/min — ABNORMAL LOW (ref >60)
GFR calc non Af Amer: 6 mL/min — ABNORMAL LOW (ref >60)
Glucose, Bld: 301 mg/dL — ABNORMAL HIGH (ref 70–99)
Potassium: 5.3 mmol/L — ABNORMAL HIGH (ref 3.5–5.1)
Sodium: 137 mmol/L (ref 135–145)

## 2019-03-09 LAB — CBC
HCT: 33.4 % — ABNORMAL LOW (ref 36.0–46.0)
Hemoglobin: 10.2 g/dL — ABNORMAL LOW (ref 12.0–15.0)
MCH: 24.6 pg — ABNORMAL LOW (ref 26.0–34.0)
MCHC: 30.5 g/dL (ref 30.0–36.0)
MCV: 80.5 fL (ref 80.0–100.0)
Platelets: 180 10*3/uL (ref 150–400)
RBC: 4.15 MIL/uL (ref 3.87–5.11)
RDW: 18 % — ABNORMAL HIGH (ref 11.5–15.5)
WBC: 5.5 10*3/uL (ref 4.0–10.5)
nRBC: 0 % (ref 0.0–0.2)

## 2019-03-09 LAB — TROPONIN I (HIGH SENSITIVITY)
Troponin I (High Sensitivity): 14 ng/L (ref <18)
Troponin I (High Sensitivity): 14 ng/L (ref <18)

## 2019-03-09 LAB — PROTIME-INR
INR: 1.1 (ref 0.8–1.2)
Prothrombin Time: 13.8 seconds (ref 11.4–15.2)

## 2019-03-09 MED ORDER — CHLORHEXIDINE GLUCONATE CLOTH 2 % EX PADS: 6.0000 | MEDICATED_PAD | Freq: Every day | CUTANEOUS | Status: DC

## 2019-03-09 MED ORDER — NITROGLYCERIN 2 % TD OINT
1.0000 [in_us] | TOPICAL_OINTMENT | Freq: Once | TRANSDERMAL | Status: AC
  Administered 2019-03-09: 1 [in_us] via TOPICAL
  Filled 2019-03-09: qty 1

## 2019-03-09 MED ORDER — SODIUM CHLORIDE 0.9% FLUSH
3.0000 mL | Freq: Once | INTRAVENOUS | Status: AC
  Administered 2019-03-10: 01:00:00 3 mL via INTRAVENOUS

## 2019-03-09 MED ORDER — HYDRALAZINE HCL 25 MG PO TABS
50.0000 mg | ORAL_TABLET | Freq: Once | ORAL | Status: AC
  Administered 2019-03-09: 50 mg via ORAL
  Filled 2019-03-09: qty 2
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
## 2019-03-09 NOTE — ED Triage Notes (Signed)
Patient reports central chest tightness/SOB onset today , last hemodialysis treatment Friday last week , occasional dry cough , no emesis or diaphoresis , patient added right ear discomfort this week .
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
## 2019-03-10 ENCOUNTER — Encounter (HOSPITAL_COMMUNITY): Payer: Self-pay | Admitting: Internal Medicine

## 2019-03-10 DIAGNOSIS — R0789 Other chest pain: Secondary | ICD-10-CM | POA: Diagnosis present

## 2019-03-10 DIAGNOSIS — G546 Phantom limb syndrome with pain: Secondary | ICD-10-CM | POA: Diagnosis present

## 2019-03-10 DIAGNOSIS — F331 Major depressive disorder, recurrent, moderate: Secondary | ICD-10-CM | POA: Diagnosis present

## 2019-03-10 DIAGNOSIS — D72829 Elevated white blood cell count, unspecified: Secondary | ICD-10-CM | POA: Diagnosis present

## 2019-03-10 DIAGNOSIS — R45851 Suicidal ideations: Secondary | ICD-10-CM | POA: Diagnosis present

## 2019-03-10 DIAGNOSIS — Z992 Dependence on renal dialysis: Secondary | ICD-10-CM | POA: Diagnosis not present

## 2019-03-10 DIAGNOSIS — N186 End stage renal disease: Secondary | ICD-10-CM | POA: Diagnosis present

## 2019-03-10 DIAGNOSIS — I160 Hypertensive urgency: Secondary | ICD-10-CM | POA: Diagnosis not present

## 2019-03-10 DIAGNOSIS — E1121 Type 2 diabetes mellitus with diabetic nephropathy: Secondary | ICD-10-CM | POA: Diagnosis not present

## 2019-03-10 DIAGNOSIS — D631 Anemia in chronic kidney disease: Secondary | ICD-10-CM | POA: Diagnosis present

## 2019-03-10 DIAGNOSIS — Z23 Encounter for immunization: Secondary | ICD-10-CM | POA: Diagnosis present

## 2019-03-10 DIAGNOSIS — E785 Hyperlipidemia, unspecified: Secondary | ICD-10-CM | POA: Diagnosis present

## 2019-03-10 DIAGNOSIS — D696 Thrombocytopenia, unspecified: Secondary | ICD-10-CM | POA: Diagnosis present

## 2019-03-10 DIAGNOSIS — F411 Generalized anxiety disorder: Secondary | ICD-10-CM | POA: Diagnosis not present

## 2019-03-10 DIAGNOSIS — Z20828 Contact with and (suspected) exposure to other viral communicable diseases: Secondary | ICD-10-CM | POA: Diagnosis present

## 2019-03-10 DIAGNOSIS — N2581 Secondary hyperparathyroidism of renal origin: Secondary | ICD-10-CM | POA: Diagnosis present

## 2019-03-10 DIAGNOSIS — J9601 Acute respiratory failure with hypoxia: Secondary | ICD-10-CM | POA: Diagnosis present

## 2019-03-10 DIAGNOSIS — I5033 Acute on chronic diastolic (congestive) heart failure: Secondary | ICD-10-CM | POA: Diagnosis present

## 2019-03-10 DIAGNOSIS — F909 Attention-deficit hyperactivity disorder, unspecified type: Secondary | ICD-10-CM | POA: Diagnosis present

## 2019-03-10 DIAGNOSIS — Z89512 Acquired absence of left leg below knee: Secondary | ICD-10-CM | POA: Diagnosis not present

## 2019-03-10 DIAGNOSIS — I132 Hypertensive heart and chronic kidney disease with heart failure and with stage 5 chronic kidney disease, or end stage renal disease: Secondary | ICD-10-CM | POA: Diagnosis present

## 2019-03-10 DIAGNOSIS — I2510 Atherosclerotic heart disease of native coronary artery without angina pectoris: Secondary | ICD-10-CM | POA: Diagnosis present

## 2019-03-10 DIAGNOSIS — E669 Obesity, unspecified: Secondary | ICD-10-CM | POA: Diagnosis present

## 2019-03-10 DIAGNOSIS — T7611XA Adult physical abuse, suspected, initial encounter: Secondary | ICD-10-CM | POA: Diagnosis present

## 2019-03-10 DIAGNOSIS — G4700 Insomnia, unspecified: Secondary | ICD-10-CM | POA: Diagnosis not present

## 2019-03-10 DIAGNOSIS — I161 Hypertensive emergency: Secondary | ICD-10-CM | POA: Diagnosis present

## 2019-03-10 DIAGNOSIS — F329 Major depressive disorder, single episode, unspecified: Secondary | ICD-10-CM | POA: Diagnosis not present

## 2019-03-10 DIAGNOSIS — E875 Hyperkalemia: Secondary | ICD-10-CM | POA: Diagnosis present

## 2019-03-10 DIAGNOSIS — E8889 Other specified metabolic disorders: Secondary | ICD-10-CM | POA: Diagnosis present

## 2019-03-10 DIAGNOSIS — J810 Acute pulmonary edema: Secondary | ICD-10-CM | POA: Diagnosis present

## 2019-03-10 DIAGNOSIS — E1022 Type 1 diabetes mellitus with diabetic chronic kidney disease: Secondary | ICD-10-CM | POA: Diagnosis present

## 2019-03-10 DIAGNOSIS — F419 Anxiety disorder, unspecified: Secondary | ICD-10-CM | POA: Diagnosis present

## 2019-03-10 LAB — CREATININE, SERUM
Creatinine, Ser: 8.32 mg/dL — ABNORMAL HIGH (ref 0.44–1.00)
GFR calc Af Amer: 6 mL/min — ABNORMAL LOW (ref >60)
GFR calc non Af Amer: 5 mL/min — ABNORMAL LOW (ref >60)

## 2019-03-10 LAB — HIV ANTIBODY (ROUTINE TESTING W REFLEX): HIV Screen 4th Generation wRfx: NONREACTIVE

## 2019-03-10 LAB — CBC
HCT: 30.4 % — ABNORMAL LOW (ref 36.0–46.0)
Hemoglobin: 9.2 g/dL — ABNORMAL LOW (ref 12.0–15.0)
MCH: 24.9 pg — ABNORMAL LOW (ref 26.0–34.0)
MCHC: 30.3 g/dL (ref 30.0–36.0)
MCV: 82.2 fL (ref 80.0–100.0)
Platelets: 152 10*3/uL (ref 150–400)
RBC: 3.7 MIL/uL — ABNORMAL LOW (ref 3.87–5.11)
RDW: 18.3 % — ABNORMAL HIGH (ref 11.5–15.5)
WBC: 6.2 10*3/uL (ref 4.0–10.5)
nRBC: 0 % (ref 0.0–0.2)

## 2019-03-10 LAB — BASIC METABOLIC PANEL
Anion gap: 19 — ABNORMAL HIGH (ref 5–15)
BUN: 87 mg/dL — ABNORMAL HIGH (ref 6–20)
CO2: 24 mmol/L (ref 22–32)
Calcium: 8.5 mg/dL — ABNORMAL LOW (ref 8.9–10.3)
Chloride: 93 mmol/L — ABNORMAL LOW (ref 98–111)
Creatinine, Ser: 8.01 mg/dL — ABNORMAL HIGH (ref 0.44–1.00)
GFR calc Af Amer: 6 mL/min — ABNORMAL LOW (ref >60)
GFR calc non Af Amer: 5 mL/min — ABNORMAL LOW (ref >60)
Glucose, Bld: 207 mg/dL — ABNORMAL HIGH (ref 70–99)
Potassium: 5.3 mmol/L — ABNORMAL HIGH (ref 3.5–5.1)
Sodium: 136 mmol/L (ref 135–145)

## 2019-03-10 LAB — GLUCOSE, CAPILLARY
Glucose-Capillary: 335 mg/dL — ABNORMAL HIGH (ref 70–99)
Glucose-Capillary: 126 mg/dL — ABNORMAL HIGH (ref 70–99)

## 2019-03-10 LAB — CBG MONITORING, ED: Glucose-Capillary: 187 mg/dL — ABNORMAL HIGH (ref 70–99)

## 2019-03-10 LAB — SARS CORONAVIRUS 2 BY RT PCR (HOSPITAL ORDER, PERFORMED IN ~~LOC~~ HOSPITAL LAB): SARS Coronavirus 2: NEGATIVE

## 2019-03-10 LAB — HEMOGLOBIN A1C
Hgb A1c MFr Bld: 8.5 % — ABNORMAL HIGH (ref 4.8–5.6)
Mean Plasma Glucose: 197.25 mg/dL

## 2019-03-10 MED ORDER — INSULIN ASPART 100 UNIT/ML ~~LOC~~ SOLN
0.0000 [IU] | Freq: Three times a day (TID) | SUBCUTANEOUS | Status: DC
  Administered 2019-03-10: 7 [IU] via SUBCUTANEOUS
  Administered 2019-03-11 – 2019-03-13 (×5): 2 [IU] via SUBCUTANEOUS
  Administered 2019-03-13: 1 [IU] via SUBCUTANEOUS
  Administered 2019-03-13: 3 [IU] via SUBCUTANEOUS
  Administered 2019-03-14: 2 [IU] via SUBCUTANEOUS
  Administered 2019-03-14: 1 [IU] via SUBCUTANEOUS
  Administered 2019-03-14: 3 [IU] via SUBCUTANEOUS
  Administered 2019-03-15: 2 [IU] via SUBCUTANEOUS
  Administered 2019-03-15: 07:00:00 1 [IU] via SUBCUTANEOUS
  Administered 2019-03-15: 5 [IU] via SUBCUTANEOUS
  Administered 2019-03-16 – 2019-03-17 (×4): 2 [IU] via SUBCUTANEOUS
  Administered 2019-03-17: 3 [IU] via SUBCUTANEOUS
  Administered 2019-03-18 – 2019-03-20 (×6): 2 [IU] via SUBCUTANEOUS
  Administered 2019-03-20: 1 [IU] via SUBCUTANEOUS
  Administered 2019-03-20 – 2019-03-21 (×3): 2 [IU] via SUBCUTANEOUS
  Administered 2019-03-22 (×2): 1 [IU] via SUBCUTANEOUS
  Administered 2019-03-22: 3 [IU] via SUBCUTANEOUS
  Administered 2019-03-23 – 2019-03-24 (×2): 2 [IU] via SUBCUTANEOUS

## 2019-03-10 MED ORDER — HYDRALAZINE HCL 20 MG/ML IJ SOLN: 10.0000 mg | INTRAMUSCULAR | Status: DC | PRN

## 2019-03-10 MED ORDER — CLOPIDOGREL BISULFATE 75 MG PO TABS
75.0000 mg | ORAL_TABLET | Freq: Every day | ORAL | Status: DC
  Administered 2019-03-10 – 2019-03-24 (×15): 75 mg via ORAL
  Filled 2019-03-10 (×15): qty 1

## 2019-03-10 MED ORDER — ONDANSETRON HCL 4 MG/2ML IJ SOLN
4.0000 mg | Freq: Four times a day (QID) | INTRAMUSCULAR | Status: DC | PRN
  Administered 2019-03-21 – 2019-03-22 (×2): 4 mg via INTRAVENOUS
  Filled 2019-03-10 (×2): qty 2

## 2019-03-10 MED ORDER — SEVELAMER CARBONATE 800 MG PO TABS
2400.0000 mg | ORAL_TABLET | Freq: Three times a day (TID) | ORAL | Status: DC
  Administered 2019-03-10 – 2019-03-24 (×31): 2400 mg via ORAL
  Filled 2019-03-10 (×41): qty 3

## 2019-03-10 MED ORDER — QUETIAPINE FUMARATE 25 MG PO TABS
100.0000 mg | ORAL_TABLET | Freq: Every day | ORAL | Status: DC
  Administered 2019-03-10 – 2019-03-17 (×8): 100 mg via ORAL
  Filled 2019-03-10: qty 4
  Filled 2019-03-10: qty 1
  Filled 2019-03-10 (×6): qty 4
  Filled 2019-03-10: qty 1
  Filled 2019-03-10: qty 4

## 2019-03-10 MED ORDER — ASPIRIN EC 81 MG PO TBEC
81.0000 mg | DELAYED_RELEASE_TABLET | Freq: Every day | ORAL | Status: DC
  Administered 2019-03-10 – 2019-03-24 (×15): 81 mg via ORAL
  Filled 2019-03-10 (×14): qty 1

## 2019-03-10 MED ORDER — ESCITALOPRAM OXALATE 10 MG PO TABS
10.0000 mg | ORAL_TABLET | Freq: Every day | ORAL | Status: DC
  Administered 2019-03-10 – 2019-03-11 (×2): 10 mg via ORAL
  Filled 2019-03-10 (×2): qty 1

## 2019-03-10 MED ORDER — AMLODIPINE BESYLATE 10 MG PO TABS
10.0000 mg | ORAL_TABLET | Freq: Every day | ORAL | Status: DC
  Administered 2019-03-10 – 2019-03-15 (×6): 10 mg via ORAL
  Filled 2019-03-10 (×6): qty 1
  Filled 2019-03-10: qty 2

## 2019-03-10 MED ORDER — CHLORHEXIDINE GLUCONATE CLOTH 2 % EX PADS
6.0000 | MEDICATED_PAD | Freq: Every day | CUTANEOUS | Status: DC
  Administered 2019-03-12: 15:00:00 6 via TOPICAL

## 2019-03-10 MED ORDER — ONDANSETRON HCL 4 MG PO TABS: 4.0000 mg | ORAL_TABLET | Freq: Four times a day (QID) | ORAL | Status: DC | PRN

## 2019-03-10 MED ORDER — ACETAMINOPHEN 325 MG PO TABS
ORAL_TABLET | ORAL | Status: AC
  Filled 2019-03-10: qty 2

## 2019-03-10 MED ORDER — CARVEDILOL 6.25 MG PO TABS
6.2500 mg | ORAL_TABLET | Freq: Two times a day (BID) | ORAL | Status: DC
  Administered 2019-03-10 – 2019-03-16 (×12): 6.25 mg via ORAL
  Filled 2019-03-10 (×12): qty 1

## 2019-03-10 MED ORDER — HYDRALAZINE HCL 50 MG PO TABS
50.0000 mg | ORAL_TABLET | Freq: Three times a day (TID) | ORAL | Status: DC
  Administered 2019-03-10 – 2019-03-15 (×15): 50 mg via ORAL
  Filled 2019-03-10 (×11): qty 1
  Filled 2019-03-10: qty 2
  Filled 2019-03-10 (×3): qty 1

## 2019-03-10 MED ORDER — GABAPENTIN 300 MG PO CAPS
300.0000 mg | ORAL_CAPSULE | Freq: Every day | ORAL | Status: DC
  Administered 2019-03-10 – 2019-03-17 (×9): 300 mg via ORAL
  Filled 2019-03-10 (×9): qty 1

## 2019-03-10 MED ORDER — TRAZODONE HCL 100 MG PO TABS
100.0000 mg | ORAL_TABLET | Freq: Every evening | ORAL | Status: DC | PRN
  Administered 2019-03-10 – 2019-03-17 (×6): 100 mg via ORAL
  Filled 2019-03-10 (×6): qty 1

## 2019-03-10 MED ORDER — HEPARIN SODIUM (PORCINE) 5000 UNIT/ML IJ SOLN
5000.0000 [IU] | Freq: Three times a day (TID) | INTRAMUSCULAR | Status: DC
  Administered 2019-03-10 – 2019-03-24 (×40): 5000 [IU] via SUBCUTANEOUS
  Filled 2019-03-10 (×41): qty 1

## 2019-03-10 MED ORDER — ACETAMINOPHEN 325 MG PO TABS
650.0000 mg | ORAL_TABLET | Freq: Four times a day (QID) | ORAL | Status: DC | PRN
  Administered 2019-03-10 – 2019-03-22 (×6): 650 mg via ORAL
  Filled 2019-03-10 (×5): qty 2

## 2019-03-10 MED ORDER — RENA-VITE PO TABS
1.0000 | ORAL_TABLET | Freq: Every day | ORAL | Status: DC
  Administered 2019-03-10 – 2019-03-23 (×14): 1 via ORAL
  Filled 2019-03-10 (×16): qty 1

## 2019-03-10 MED ORDER — CALCIUM ACETATE (PHOS BINDER) 667 MG PO CAPS
667.0000 mg | ORAL_CAPSULE | Freq: Three times a day (TID) | ORAL | Status: DC
  Administered 2019-03-10 – 2019-03-24 (×32): 667 mg via ORAL
  Filled 2019-03-10 (×39): qty 1

## 2019-03-10 MED ORDER — LATANOPROST 0.005 % OP SOLN
1.0000 [drp] | Freq: Every day | OPHTHALMIC | Status: DC
  Administered 2019-03-10 – 2019-03-23 (×15): 1 [drp] via OPHTHALMIC
  Filled 2019-03-10 (×2): qty 2.5

## 2019-03-10 MED ORDER — ATOMOXETINE HCL 40 MG PO CAPS
40.0000 mg | ORAL_CAPSULE | Freq: Every day | ORAL | Status: DC
  Administered 2019-03-10 – 2019-03-24 (×15): 40 mg via ORAL
  Filled 2019-03-10 (×17): qty 1

## 2019-03-10 MED ORDER — ALBUTEROL SULFATE (2.5 MG/3ML) 0.083% IN NEBU
2.5000 mg | INHALATION_SOLUTION | Freq: Four times a day (QID) | RESPIRATORY_TRACT | Status: DC | PRN
  Administered 2019-03-20: 2.5 mg via RESPIRATORY_TRACT
  Filled 2019-03-10: qty 3

## 2019-03-10 MED ORDER — CYCLOBENZAPRINE HCL 5 MG PO TABS
5.0000 mg | ORAL_TABLET | Freq: Once | ORAL | Status: AC
  Administered 2019-03-10: 5 mg via ORAL
  Filled 2019-03-10: qty 1

## 2019-03-10 MED ORDER — ACETAMINOPHEN 650 MG RE SUPP: 650.0000 mg | Freq: Four times a day (QID) | RECTAL | Status: DC | PRN
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
## 2019-03-10 NOTE — Progress Notes (Signed)
Renal Navigator contacted [facility] in [place] and confirmed that patient is a current patient there for OP HD treatment. Renal Navigator was told that patient last treatment at this clinic on Friday, 03/04/19 and was there yesterday, however, decided not to dialyze. She has a seat schedule of MWF 11:30am.  Renal Navigator met with patient at HD bedside. She reports that she took a Visual merchandiser to [facility] yesterday because "I'm going to live here now." She states, "a Education officer, museum in the hospital last night said she is going to help me find a place to live."  Renal Navigator left message for ED Social Worker to discuss patient's situation and will continue to follow.  Alphonzo Cruise, [place] Renal Navigator [phone]
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
## 2019-03-10 NOTE — ED Notes (Signed)
SDU 
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
## 2019-03-10 NOTE — ED Notes (Signed)
Breakfast Tray Ordered. 
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
## 2019-03-10 NOTE — Progress Notes (Signed)
2nd shift CSW has reviewed the chart and is now updated on the status of pts care. CSW attempting to reach out to Renal Navigator to continue assisting patient with disposition.   [place] Transitions of Care  Clinical Social Worker  Ph: [phone]

## 2019-03-11 DIAGNOSIS — F329 Major depressive disorder, single episode, unspecified: Secondary | ICD-10-CM

## 2019-03-11 DIAGNOSIS — F411 Generalized anxiety disorder: Secondary | ICD-10-CM

## 2019-03-11 DIAGNOSIS — Z992 Dependence on renal dialysis: Secondary | ICD-10-CM

## 2019-03-11 DIAGNOSIS — N186 End stage renal disease: Secondary | ICD-10-CM

## 2019-03-11 DIAGNOSIS — I5033 Acute on chronic diastolic (congestive) heart failure: Secondary | ICD-10-CM

## 2019-03-11 DIAGNOSIS — G4700 Insomnia, unspecified: Secondary | ICD-10-CM

## 2019-03-11 DIAGNOSIS — F331 Major depressive disorder, recurrent, moderate: Secondary | ICD-10-CM

## 2019-03-11 DIAGNOSIS — R45851 Suicidal ideations: Secondary | ICD-10-CM

## 2019-03-11 LAB — GLUCOSE, CAPILLARY
Glucose-Capillary: 198 mg/dL — ABNORMAL HIGH (ref 70–99)
Glucose-Capillary: 113 mg/dL — ABNORMAL HIGH (ref 70–99)
Glucose-Capillary: 156 mg/dL — ABNORMAL HIGH (ref 70–99)
Glucose-Capillary: 124 mg/dL — ABNORMAL HIGH (ref 70–99)

## 2019-03-11 LAB — MRSA PCR SCREENING: MRSA by PCR: NEGATIVE

## 2019-03-11 MED ORDER — GABAPENTIN 100 MG PO CAPS: 200.0000 mg | ORAL_CAPSULE | Freq: Every day | ORAL | Status: DC

## 2019-03-11 MED ORDER — ESCITALOPRAM OXALATE 10 MG PO TABS
20.0000 mg | ORAL_TABLET | Freq: Every day | ORAL | Status: DC
  Administered 2019-03-12 – 2019-03-24 (×13): 20 mg via ORAL
  Filled 2019-03-11 (×15): qty 2

## 2019-03-11 MED ORDER — GABAPENTIN 100 MG PO CAPS
100.0000 mg | ORAL_CAPSULE | Freq: Two times a day (BID) | ORAL | Status: DC
  Administered 2019-03-11 – 2019-03-17 (×11): 100 mg via ORAL
  Filled 2019-03-11 (×11): qty 1

## 2019-03-11 MED ORDER — DARBEPOETIN ALFA 150 MCG/0.3ML IJ SOSY
PREFILLED_SYRINGE | INTRAMUSCULAR | Status: AC
  Administered 2019-03-11: 12:00:00
  Filled 2019-03-11: qty 0.3

## 2019-03-11 MED ORDER — GABAPENTIN 100 MG PO CAPS: 100.0000 mg | ORAL_CAPSULE | Freq: Two times a day (BID) | ORAL | Status: DC

## 2019-03-11 NOTE — Progress Notes (Signed)
  [facility] Progress Note   Assessment/ Plan:   # Acute hypoxic respiratory failure  - oxygen at 2 liters currently  - optimize volume with HD-- getting better  # ESRD on HD  - HD today then will need to transition back to MWF schedule- HD today 10/9   # HTN emergency with chest pain  - Follow on current regimen  - optimize volume with HD   # Hyperkalemia - mild   # Anemia of CKD  - resume ESA once HTN controlled   Subjective:    Seen on HD.  No complaints presently.  Wonders why she is going to HD 2 days in a row.     Objective:   BP (!) 176/81   Pulse 75   Temp 98.6 F (37 C) (Oral)   Resp 16   Ht 5\' 7"  (1.702 m)   Wt 93.7 kg   SpO2 100%   BMI 32.35 kg/m   Physical Exam: Gen: NAD, lying in bed CVS: RRR Resp: clear anteriorly Abd: obese, NAD Ext: 1+ RLE edema  Labs: BMET Recent Labs  Lab 03/09/19 1936 03/10/19 0407  NA 137 136  K 5.3* 5.3*  CL 92* 93*  CO2 26 24  GLUCOSE 301* 207*  BUN 80* 87*  CREATININE 7.71* 8.01*  8.32*  CALCIUM 8.8* 8.5*   CBC Recent Labs  Lab 03/09/19 1936 03/10/19 0407  WBC 5.5 6.2  HGB 10.2* 9.2*  HCT 33.4* 30.4*  MCV 80.5 82.2  PLT 180 152    @IMGRELPRIORS @ Medications:    . amLODipine  10 mg Oral Daily  . aspirin EC  81 mg Oral Daily  . atomoxetine  40 mg Oral Daily  . calcium acetate  667 mg Oral TID WC  . carvedilol  6.25 mg Oral BID WC  . Chlorhexidine Gluconate Cloth  6 each Topical Q0600  . Chlorhexidine Gluconate Cloth  6 each Topical Q0600  . clopidogrel  75 mg Oral Daily  . escitalopram  10 mg Oral Daily  . gabapentin  300 mg Oral QHS  . heparin  5,000 Units Subcutaneous Q8H  . hydrALAZINE  50 mg Oral Q8H  . insulin aspart  0-9 Units Subcutaneous TID WC  . latanoprost  1 drop Both Eyes QHS  . multivitamin  1 tablet Oral QHS  . QUEtiapine  100 mg Oral QHS  . sevelamer carbonate  2,400 mg Oral TID WC     Madelon Lips, MD [facility] [phone] 03/11/2019, 9:28 AM
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
## 2019-03-11 NOTE — Progress Notes (Signed)
Renal Navigator awaiting dispo in order to work on referral to OP HD clinic in [place].   Alphonzo Cruise, [place] Renal Navigator [phone]

## 2019-03-11 NOTE — Procedures (Signed)
Patient seen and examined on Hemodialysis. BP (!) 176/81   Pulse 75   Temp 98.6 F (37 C) (Oral)   Resp 16   Ht 5\' 7"  (1.702 m)   Wt 93.7 kg   SpO2 100%   BMI 32.35 kg/m   QB 400 mL/ min, UF goal 3.5L  Tolerating treatment without complaints at this time.  Madelon Lips MD [facility] pgr [phone] 9:34 AM
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
## 2019-03-11 NOTE — Discharge Instructions (Signed)
[facility] [place] for Mental health care  Social services organization in [place], [place] Address: [address] [place], [place] [address] Hours:  Open ? Closes 12AM Phone: [phone] Appointments: [email or web address]  Mental health clinic in [place] COVID-19 info: [email or web address] Get online care: [email or web address] Address: [place], [place], [place] [address] Hours:  Open ? Closes 5PM Phone: [phone]

## 2019-03-11 NOTE — Progress Notes (Signed)
Elevated BP.  Per HD RN hold hydralazine since going to HD.

## 2019-03-11 NOTE — Progress Notes (Signed)
Patient weaned to room air. Oxygen saturation is at 100% on room air. Telemetry monitoring discontinued per MD orders. Telemetry box removed and  CCMD called.

## 2019-03-12 ENCOUNTER — Encounter (HOSPITAL_COMMUNITY): Payer: Self-pay

## 2019-03-12 LAB — GLUCOSE, CAPILLARY
Glucose-Capillary: 119 mg/dL — ABNORMAL HIGH (ref 70–99)
Glucose-Capillary: 174 mg/dL — ABNORMAL HIGH (ref 70–99)
Glucose-Capillary: 192 mg/dL — ABNORMAL HIGH (ref 70–99)
Glucose-Capillary: 223 mg/dL — ABNORMAL HIGH (ref 70–99)

## 2019-03-12 MED ORDER — INFLUENZA VAC SPLIT QUAD 0.5 ML IM SUSY
0.5000 mL | PREFILLED_SYRINGE | INTRAMUSCULAR | Status: AC | PRN
  Administered 2019-03-24: 15:00:00 0.5 mL via INTRAMUSCULAR

## 2019-03-12 NOTE — Progress Notes (Signed)
  [facility] Progress Note   Assessment/ Plan:   1.  Acute hypoxic RF- secondary to volume overload.  Was on 2L [place], now off on RA.   2.  Hyperkalemia: resolved with dialysis 3.  ESRD: previously living in [place].  Dialyzed Thurs and Fri.  Next planned HD 10/10. 4.  Anemia: will give ESA as appropriate 5.  Metabolic bone disease: check phos- binders 6.  Dispo: will need OP CLIP  Subjective:    No complaints this AM.     Objective:   BP (!) 135/95 (BP Location: Right Arm)   Pulse 84   Temp 98.4 F (36.9 C) (Oral)   Resp 20   Ht 5\' 7"  (1.702 m)   Wt 88.8 kg   SpO2 100%   BMI 30.66 kg/m   Physical Exam: Gen: NAD, lying in bed CVS: RRR Resp: clear anteriorly and posteriorly Abd: obese, NAD Ext: trace RLE edema  Labs: BMET Recent Labs  Lab 03/09/19 1936 03/10/19 0407  NA 137 136  K 5.3* 5.3*  CL 92* 93*  CO2 26 24  GLUCOSE 301* 207*  BUN 80* 87*  CREATININE 7.71* 8.01*  8.32*  CALCIUM 8.8* 8.5*   CBC Recent Labs  Lab 03/09/19 1936 03/10/19 0407  WBC 5.5 6.2  HGB 10.2* 9.2*  HCT 33.4* 30.4*  MCV 80.5 82.2  PLT 180 152    @IMGRELPRIORS @ Medications:    . amLODipine  10 mg Oral Daily  . aspirin EC  81 mg Oral Daily  . atomoxetine  40 mg Oral Daily  . calcium acetate  667 mg Oral TID WC  . carvedilol  6.25 mg Oral BID WC  . Chlorhexidine Gluconate Cloth  6 each Topical Q0600  . Chlorhexidine Gluconate Cloth  6 each Topical Q0600  . clopidogrel  75 mg Oral Daily  . escitalopram  20 mg Oral Daily  . gabapentin  100 mg Oral BID  . gabapentin  300 mg Oral QHS  . heparin  5,000 Units Subcutaneous Q8H  . hydrALAZINE  50 mg Oral Q8H  . insulin aspart  0-9 Units Subcutaneous TID WC  . latanoprost  1 drop Both Eyes QHS  . multivitamin  1 tablet Oral QHS  . QUEtiapine  100 mg Oral QHS  . sevelamer carbonate  2,400 mg Oral TID WC     Madelon Lips, MD [facility] [phone] 03/12/2019, 11:28 AM

## 2019-03-12 NOTE — Progress Notes (Signed)
   Vital Signs MEWS/VS Documentation      03/12/2019 0100 03/12/2019 0521 03/12/2019 0652 03/12/2019 0749   MEWS Score:  0  0  0  0   MEWS Score Color:  Green  Green  Green  Green   Resp:  16  14  -  18   Pulse:  71  74  -  76   BP:  131/67  (!) 151/77  -  (!) 170/82   Temp:  98.4 F (36.9 C)  97.7 F (36.5 C)  -  -   O2 Device:  Room [employer]  -  Room Air   Level of Consciousness:  -  -  Alert  -           Maud Deed Tobias-Diakun 03/12/2019,7:54 AM
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
## 2019-03-12 NOTE — Progress Notes (Signed)
Patient sleeping during shift report.      

## 2019-03-13 LAB — GLUCOSE, CAPILLARY
Glucose-Capillary: 166 mg/dL — ABNORMAL HIGH (ref 70–99)
Glucose-Capillary: 141 mg/dL — ABNORMAL HIGH (ref 70–99)
Glucose-Capillary: 245 mg/dL — ABNORMAL HIGH (ref 70–99)
Glucose-Capillary: 161 mg/dL — ABNORMAL HIGH (ref 70–99)

## 2019-03-13 MED ORDER — SODIUM CHLORIDE 0.9 % IV SOLN: 100.0000 mL | INTRAVENOUS | Status: DC | PRN

## 2019-03-13 MED ORDER — LIDOCAINE HCL (PF) 1 % IJ SOLN: 5.0000 mL | INTRAMUSCULAR | Status: DC | PRN

## 2019-03-13 MED ORDER — LIDOCAINE-PRILOCAINE 2.5-2.5 % EX CREA: 1.0000 "application " | TOPICAL_CREAM | CUTANEOUS | Status: DC | PRN

## 2019-03-13 MED ORDER — CHLORHEXIDINE GLUCONATE CLOTH 2 % EX PADS
6.0000 | MEDICATED_PAD | Freq: Every day | CUTANEOUS | Status: DC
  Administered 2019-03-14 – 2019-03-16 (×2): 6 via TOPICAL

## 2019-03-13 MED ORDER — PENTAFLUOROPROP-TETRAFLUOROETH EX AERO: 1.0000 "application " | INHALATION_SPRAY | CUTANEOUS | Status: DC | PRN

## 2019-03-13 MED ORDER — DARBEPOETIN ALFA 60 MCG/0.3ML IJ SOSY
60.0000 ug | PREFILLED_SYRINGE | INTRAMUSCULAR | Status: DC
  Administered 2019-03-18: 17:00:00 60 ug via INTRAVENOUS
  Filled 2019-03-13: qty 0.3
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
## 2019-03-14 LAB — CBC WITH DIFFERENTIAL/PLATELET
Abs Immature Granulocytes: 0.01 10*3/uL (ref 0.00–0.07)
Basophils Absolute: 0 10*3/uL (ref 0.0–0.1)
Basophils Relative: 1 %
Eosinophils Absolute: 0.3 10*3/uL (ref 0.0–0.5)
Eosinophils Relative: 7 %
HCT: 30.6 % — ABNORMAL LOW (ref 36.0–46.0)
Hemoglobin: 9.8 g/dL — ABNORMAL LOW (ref 12.0–15.0)
Immature Granulocytes: 0 %
Lymphocytes Relative: 33 %
Lymphs Abs: 1.6 10*3/uL (ref 0.7–4.0)
MCH: 25.1 pg — ABNORMAL LOW (ref 26.0–34.0)
MCHC: 32 g/dL (ref 30.0–36.0)
MCV: 78.3 fL — ABNORMAL LOW (ref 80.0–100.0)
Monocytes Absolute: 0.4 10*3/uL (ref 0.1–1.0)
Monocytes Relative: 9 %
Neutro Abs: 2.5 10*3/uL (ref 1.7–7.7)
Neutrophils Relative %: 50 %
Platelets: 147 10*3/uL — ABNORMAL LOW (ref 150–400)
RBC: 3.91 MIL/uL (ref 3.87–5.11)
RDW: 17.9 % — ABNORMAL HIGH (ref 11.5–15.5)
WBC: 4.8 10*3/uL (ref 4.0–10.5)
nRBC: 0 % (ref 0.0–0.2)

## 2019-03-14 LAB — RENAL FUNCTION PANEL
Albumin: 3.2 g/dL — ABNORMAL LOW (ref 3.5–5.0)
Anion gap: 14 (ref 5–15)
BUN: 83 mg/dL — ABNORMAL HIGH (ref 6–20)
CO2: 24 mmol/L (ref 22–32)
Calcium: 8.7 mg/dL — ABNORMAL LOW (ref 8.9–10.3)
Chloride: 96 mmol/L — ABNORMAL LOW (ref 98–111)
Creatinine, Ser: 9.44 mg/dL — ABNORMAL HIGH (ref 0.44–1.00)
GFR calc Af Amer: 5 mL/min — ABNORMAL LOW (ref >60)
GFR calc non Af Amer: 4 mL/min — ABNORMAL LOW (ref >60)
Glucose, Bld: 141 mg/dL — ABNORMAL HIGH (ref 70–99)
Phosphorus: 9 mg/dL — ABNORMAL HIGH (ref 2.5–4.6)
Potassium: 5.2 mmol/L — ABNORMAL HIGH (ref 3.5–5.1)
Sodium: 134 mmol/L — ABNORMAL LOW (ref 135–145)

## 2019-03-14 LAB — GLUCOSE, CAPILLARY
Glucose-Capillary: 143 mg/dL — ABNORMAL HIGH (ref 70–99)
Glucose-Capillary: 156 mg/dL — ABNORMAL HIGH (ref 70–99)
Glucose-Capillary: 227 mg/dL — ABNORMAL HIGH (ref 70–99)
Glucose-Capillary: 207 mg/dL — ABNORMAL HIGH (ref 70–99)

## 2019-03-14 LAB — HEPATITIS PANEL, ACUTE
HCV Ab: NONREACTIVE
Hep A IgM: NONREACTIVE
Hep B C IgM: NONREACTIVE
Hepatitis B Surface Ag: NONREACTIVE

## 2019-03-14 LAB — IRON AND TIBC
Iron: 37 ug/dL (ref 28–170)
Saturation Ratios: 20 % (ref 10.4–31.8)
TIBC: 185 ug/dL — ABNORMAL LOW (ref 250–450)
UIBC: 148 ug/dL

## 2019-03-14 LAB — FERRITIN: Ferritin: 1095 ng/mL — ABNORMAL HIGH (ref 11–307)

## 2019-03-14 LAB — HEPATITIS B CORE ANTIBODY, TOTAL: Hep B Core Total Ab: NONREACTIVE

## 2019-03-14 MED ORDER — HEPARIN SODIUM (PORCINE) 1000 UNIT/ML DIALYSIS
1000.0000 [IU] | INTRAMUSCULAR | Status: DC | PRN
  Filled 2019-03-14: qty 1

## 2019-03-14 MED ORDER — ALTEPLASE 2 MG IJ SOLR: 2.0000 mg | Freq: Once | INTRAMUSCULAR | Status: DC | PRN

## 2019-03-14 MED ORDER — POLYETHYLENE GLYCOL 3350 17 G PO PACK
17.0000 g | PACK | Freq: Every day | ORAL | Status: DC
  Administered 2019-03-14 – 2019-03-16 (×3): 17 g via ORAL
  Filled 2019-03-14 (×8): qty 1

## 2019-03-14 NOTE — TOC Progression Note (Signed)
Transition of Care [facility]) - Progression Note    Patient Details  Name: Lauren Warner MRN: [id number] Date of Birth: 29-May-1966  Transition of Care [facility]) CM/SW [place], [place] Phone Number: 03/14/2019, 3:03 PM  Clinical Narrative:     CSW received a phone call back from Mrs. Moore with the [facility] program. Mrs. Laurance Flatten was returning [place] phone call from Friday. CSW explained that Caryl Pina was off work. She stated that they did not have an application started for the patient. CSW stated that Caryl Pina would be back in the office on Tuesday, Mrs. Laurance Flatten asked for Caryl Pina to contact her on Tuesday so that they can start a workup on the patient. She can be reached at [phone].   RN called CSW and asked for resources for the patient. CSW informed her that she would stop by this afternoon.   Expected Discharge Plan: (TBD) Barriers to Discharge: Homeless with medical needs  Expected Discharge Plan and Services Expected Discharge Plan: (TBD)       Living arrangements for the past 2 months: Skilled Nursing Facility                                       Social Determinants of Health (SDOH) Interventions    Readmission Risk Interventions No flowsheet data found.
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
## 2019-03-14 NOTE — Progress Notes (Signed)
PT Cancellation Note  Patient Details Name: Kathleene Basilone MRN: [id number] DOB: Sep 22, 1965   Cancelled Treatment:    Reason Eval/Treat Not Completed: Patient at procedure or test/unavailable Pt currently at HD. Will follow up as schedule allows.   Leighton Ruff, PT, DPT  Acute Rehabilitation Services  Pager: [phone] Office: [phone]    Rudean Hitt 03/14/2019, 10:34 AM

## 2019-03-14 NOTE — TOC Progression Note (Signed)
Transition of Care [facility]) - Progression Note    Patient Details  Name: Lauren Warner MRN: [id number] Date of Birth: [birth year]  Transition of Care [facility]) CM/SW [place], [place] Phone Number: 03/14/2019, 5:53 PM  Clinical Narrative:     CSW met with the patient at bedside and provided additional housing resources. Patient stated that her new landlord, [employer]. She stated that he was coming to the hospital to to deliver her rental agreement.   Patient stated that she does not have the money to get her utilities cut on. She inquired about the COVID voucher program, CSW stated that she would have to currently be sick with COVID and homeless.   CSW shared that she received a phone call from [facility] program and that the [place] will follow up on Tuesday regarding her application. Patient stated that she wanted to get hemodialysis at home, CSW shared that was not possible and she would need to go to a dialysis. She stated that she would need a home dialysis center. She stated that she moved from [place] 4-5 days ago.   The patient reported that she gets $1200 per month in disability. CSW shared that PT and OT would come and evaluate her. CSW asked if she would be agreeable to SNF if it was recommended. She stated no, she would want home health. CSW stated that she did not currently have a home and that would not be able to happen until she had a residence. She asked if could have outpatient, CSW stated that it could be a possibility.   CSW will continue to follow and assist with disposition planning.   Expected Discharge Plan: (TBD) Barriers to Discharge: Homeless with medical needs  Expected Discharge Plan and Services Expected Discharge Plan: (TBD)       Living arrangements for the past 2 months: Skilled Nursing Facility                                       Social Determinants of Health (SDOH) Interventions    Readmission Risk Interventions No flowsheet data  found.

## 2019-03-15 LAB — GLUCOSE, CAPILLARY
Glucose-Capillary: 145 mg/dL — ABNORMAL HIGH (ref 70–99)
Glucose-Capillary: 194 mg/dL — ABNORMAL HIGH (ref 70–99)
Glucose-Capillary: 229 mg/dL — ABNORMAL HIGH (ref 70–99)
Glucose-Capillary: 135 mg/dL — ABNORMAL HIGH (ref 70–99)

## 2019-03-15 LAB — HEPATITIS B SURFACE ANTIBODY, QUANTITATIVE: Hep B S AB Quant (Post): 25.6 m[IU]/mL (ref >9.9)

## 2019-03-15 LAB — PARATHYROID HORMONE, INTACT (NO CA)
PTH: 105 pg/mL — ABNORMAL HIGH (ref 15–65)
PTH: 116 pg/mL — ABNORMAL HIGH (ref 15–65)

## 2019-03-15 LAB — HEPATITIS B E ANTIGEN: Hep B E Ag: NEGATIVE

## 2019-03-15 MED ORDER — CHLORHEXIDINE GLUCONATE CLOTH 2 % EX PADS: 6.0000 | MEDICATED_PAD | Freq: Every day | CUTANEOUS | Status: DC

## 2019-03-15 NOTE — TOC Progression Note (Signed)
Transition of Care [facility]) - Progression Note    Patient Details  Name: Lauren Warner MRN: [id number] Date of Birth: [birth year]  Transition of Care [facility]) CM/SW [place], [place] Phone Number: [phone] 03/15/2019, 10:04 AM  Clinical Narrative:     CSW called back [facility] [phone] to begin patient's application process for any financial assistance for houses, CSW awaiting call back.   Expected Discharge Plan: (TBD) Barriers to Discharge: Homeless with medical needs  Expected Discharge Plan and Services Expected Discharge Plan: (TBD)       Living arrangements for the past 2 months: Skilled Nursing Facility                                       Social Determinants of Health (SDOH) Interventions    Readmission Risk Interventions No flowsheet data found.
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
## 2019-03-15 NOTE — Care Management Important Message (Signed)
Important Message  Patient Details  Name: Lauren Warner MRN: [id number] Date of Birth: August 15, 1965   Medicare Important Message Given:  Yes     Shelda Altes 03/15/2019, 12:51 PM
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
## 2019-03-16 LAB — CBC
HCT: 34.4 % — ABNORMAL LOW (ref 36.0–46.0)
Hemoglobin: 10.7 g/dL — ABNORMAL LOW (ref 12.0–15.0)
MCH: 24.3 pg — ABNORMAL LOW (ref 26.0–34.0)
MCHC: 31.1 g/dL (ref 30.0–36.0)
MCV: 78.2 fL — ABNORMAL LOW (ref 80.0–100.0)
Platelets: 173 10*3/uL (ref 150–400)
RBC: 4.4 MIL/uL (ref 3.87–5.11)
RDW: 17.7 % — ABNORMAL HIGH (ref 11.5–15.5)
WBC: 4.8 10*3/uL (ref 4.0–10.5)
nRBC: 0 % (ref 0.0–0.2)

## 2019-03-16 LAB — RENAL FUNCTION PANEL
Albumin: 3.6 g/dL (ref 3.5–5.0)
Anion gap: 17 — ABNORMAL HIGH (ref 5–15)
BUN: 73 mg/dL — ABNORMAL HIGH (ref 6–20)
CO2: 23 mmol/L (ref 22–32)
Calcium: 9.5 mg/dL (ref 8.9–10.3)
Chloride: 94 mmol/L — ABNORMAL LOW (ref 98–111)
Creatinine, Ser: 8.59 mg/dL — ABNORMAL HIGH (ref 0.44–1.00)
GFR calc Af Amer: 6 mL/min — ABNORMAL LOW (ref >60)
GFR calc non Af Amer: 5 mL/min — ABNORMAL LOW (ref >60)
Glucose, Bld: 171 mg/dL — ABNORMAL HIGH (ref 70–99)
Phosphorus: 8.5 mg/dL — ABNORMAL HIGH (ref 2.5–4.6)
Potassium: 5 mmol/L (ref 3.5–5.1)
Sodium: 134 mmol/L — ABNORMAL LOW (ref 135–145)

## 2019-03-16 LAB — GLUCOSE, CAPILLARY
Glucose-Capillary: 170 mg/dL — ABNORMAL HIGH (ref 70–99)
Glucose-Capillary: 110 mg/dL — ABNORMAL HIGH (ref 70–99)
Glucose-Capillary: 154 mg/dL — ABNORMAL HIGH (ref 70–99)
Glucose-Capillary: 259 mg/dL — ABNORMAL HIGH (ref 70–99)
Glucose-Capillary: 268 mg/dL — ABNORMAL HIGH (ref 70–99)

## 2019-03-16 MED ORDER — SODIUM CHLORIDE 0.9 % IV SOLN: 100.0000 mL | INTRAVENOUS | Status: DC | PRN

## 2019-03-16 MED ORDER — PENTAFLUOROPROP-TETRAFLUOROETH EX AERO: 1.0000 "application " | INHALATION_SPRAY | CUTANEOUS | Status: DC | PRN

## 2019-03-16 MED ORDER — HEPARIN SODIUM (PORCINE) 1000 UNIT/ML DIALYSIS: 1000.0000 [IU] | INTRAMUSCULAR | Status: DC | PRN

## 2019-03-16 MED ORDER — LIDOCAINE-PRILOCAINE 2.5-2.5 % EX CREA: 1.0000 "application " | TOPICAL_CREAM | CUTANEOUS | Status: DC | PRN

## 2019-03-16 MED ORDER — ALTEPLASE 2 MG IJ SOLR: 2.0000 mg | Freq: Once | INTRAMUSCULAR | Status: DC | PRN

## 2019-03-16 MED ORDER — LIDOCAINE HCL (PF) 1 % IJ SOLN: 5.0000 mL | INTRAMUSCULAR | Status: DC | PRN

## 2019-03-16 NOTE — Progress Notes (Signed)
Physical Therapy Cancellation Note   03/16/19 1203  PT Visit Information  Last PT Received On 03/16/19  Reason Eval/Treat Not Completed Patient at procedure or test/unavailable. Pt in HD. PT will continue to follow acutely.   Earney Navy, PTA Acute Rehabilitation Services Pager: [phone] Office: [phone]

## 2019-03-16 NOTE — Progress Notes (Signed)
OT Cancellation Note  Patient Details Name: Lauren Warner MRN: [id number] DOB: Feb 04, 1966   Cancelled Treatment:    Reason Eval/Treat Not Completed: Patient at procedure or test/ unavailable. Pt at HD, will follow and see as able.   Delight Stare, OT Acute Rehabilitation Services Pager [phone] Office [phone]   Delight Stare 03/16/2019, 10:42 AM
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
## 2019-03-16 NOTE — Plan of Care (Signed)
  Problem: Education: Goal: Knowledge of General Education information will improve Description Including pain rating scale, medication(s)/side effects and non-pharmacologic comfort measures Outcome: Progressing   Problem: Health Behavior/Discharge Planning: Goal: Ability to manage health-related needs will improve Outcome: Progressing   
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
## 2019-03-16 NOTE — Progress Notes (Signed)
When speaking to patient before shift change she was upset concerning her plans for discharge and does not wish to go to an shelter or rehab, patient stated that " I would hate to do this, but I know the magic words to use so that I can stay"...hinting at her possible suggesting suicidal ideation to delay discharge Neta Mends RN 1:22 PM 03-16-2019
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
## 2019-03-17 LAB — BASIC METABOLIC PANEL
Anion gap: 14 (ref 5–15)
BUN: 47 mg/dL — ABNORMAL HIGH (ref 6–20)
CO2: 26 mmol/L (ref 22–32)
Calcium: 9 mg/dL (ref 8.9–10.3)
Chloride: 97 mmol/L — ABNORMAL LOW (ref 98–111)
Creatinine, Ser: 5.88 mg/dL — ABNORMAL HIGH (ref 0.44–1.00)
GFR calc Af Amer: 9 mL/min — ABNORMAL LOW (ref >60)
GFR calc non Af Amer: 8 mL/min — ABNORMAL LOW (ref >60)
Glucose, Bld: 220 mg/dL — ABNORMAL HIGH (ref 70–99)
Potassium: 4.9 mmol/L (ref 3.5–5.1)
Sodium: 137 mmol/L (ref 135–145)

## 2019-03-17 LAB — CBC
HCT: 36.3 % (ref 36.0–46.0)
Hemoglobin: 10.8 g/dL — ABNORMAL LOW (ref 12.0–15.0)
MCH: 23.5 pg — ABNORMAL LOW (ref 26.0–34.0)
MCHC: 29.8 g/dL — ABNORMAL LOW (ref 30.0–36.0)
MCV: 79.1 fL — ABNORMAL LOW (ref 80.0–100.0)
Platelets: 188 10*3/uL (ref 150–400)
RBC: 4.59 MIL/uL (ref 3.87–5.11)
RDW: 17.9 % — ABNORMAL HIGH (ref 11.5–15.5)
WBC: 5.1 10*3/uL (ref 4.0–10.5)
nRBC: 0 % (ref 0.0–0.2)

## 2019-03-17 LAB — GLUCOSE, CAPILLARY
Glucose-Capillary: 202 mg/dL — ABNORMAL HIGH (ref 70–99)
Glucose-Capillary: 160 mg/dL — ABNORMAL HIGH (ref 70–99)
Glucose-Capillary: 197 mg/dL — ABNORMAL HIGH (ref 70–99)
Glucose-Capillary: 175 mg/dL — ABNORMAL HIGH (ref 70–99)

## 2019-03-17 MED ORDER — CHLORHEXIDINE GLUCONATE CLOTH 2 % EX PADS
6.0000 | MEDICATED_PAD | Freq: Every day | CUTANEOUS | Status: DC
  Administered 2019-03-18 – 2019-03-20 (×3): 6 via TOPICAL
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
## 2019-03-17 NOTE — Plan of Care (Signed)
  Problem: Nutrition: Goal: Adequate nutrition will be maintained Outcome: Completed/Met   Problem: Coping: Goal: Level of anxiety will decrease Outcome: Completed/Met   Problem: Elimination: Goal: Will not experience complications related to bowel motility Outcome: Completed/Met Goal: Will not experience complications related to urinary retention Outcome: Completed/Met   Problem: Pain Managment: Goal: General experience of comfort will improve Outcome: Completed/Met   Problem: Safety: Goal: Ability to remain free from injury will improve Outcome: Completed/Met   Problem: Skin Integrity: Goal: Risk for impaired skin integrity will decrease Outcome: Completed/Met

## 2019-03-17 NOTE — TOC Progression Note (Addendum)
Transition of Care [facility]) - Progression Note    Patient Details  Name: Lauren Warner MRN: [id number] Date of Birth: 1965/07/25  Transition of Care [facility]) CM/SW [place], [place] Phone Number: 03/17/2019, 2:58 PM  Clinical Narrative:    4:32pm: Lease was faxed per request to [facility] at [email or web address] will leave copy of lease for floor CSW. Original was given back to Teacher, music and bedside RN aware.   2:58pm: Floor CSW has left for the day, soft hand off given to this Probation officer. Received a call from [name] [phone]) with [employer] who is assisting pt with covering her housing. Pt has been requesting that floor CSW send lease to [facility] to have them assist with cost. [name] provided her email [email or web address] to send lease to with pt permission. CSW requested that RN leave lease at front desk for CSW to send to [facility]. Will leave hand off for Friday CSW to f/u as well.    Expected Discharge Plan: (TBD) Barriers to Discharge: Homeless with medical needs  Expected Discharge Plan and Services Expected Discharge Plan: (TBD)  Living arrangements for the past 2 months: Skilled Nursing Facility   Social Determinants of Health (SDOH) Interventions    Readmission Risk Interventions No flowsheet data found.

## 2019-03-17 NOTE — TOC Progression Note (Signed)
Transition of Care [facility]) - Progression Note    Patient Details  Name: Lauren Warner MRN: [id number] Date of Birth: 11-Dec-1965  Transition of Care [facility]) CM/SW [place], [place] Phone Number: [phone] 03/17/2019, 1:29 PM  Clinical Narrative:     CSW has consulted with: [facility] at [phone] they report 2 rooms avail [place] in [place] one in [place], both second floor rooms. Patient unable to access.  [place] [facility] [place] - [phone] - No rooms they report [facility] - [phone] lvm [facility] - no bed available they report [facility] - No beds available at this time [facility] [phone] - does not take patients out of state due to covid at capacity  CSW has called [facility] at [phone] yesterday 10/14 and lvm.   CSW has called [facility] today X2 and lvm with no return call.   Patient continues to want CSW to fax or email her personal rental documents, CSW can assist once confirming fax or email address with [facility] staff. This has been communicated with patient multiple times.  No staff have called CSW back, CSW has not spoken to anyone with this agency as of date.   CSW met with patient today, with RN and MD at bedside. Patient continues to request [facility] be contacted, CSW informed of multiple attempts and that CSW will need to confirm email or fax before sending patient's personal information to agency. Patient continues to be agreeable to women's shelter, CSW explained barrier of finding room that is accessible for her. Patient called [facility] while CSW was in room, LVM with CSW name and number for requested call back. Patient once again requested hospital funds to pay for rental home, CSW reminded patient about conversation last week regarding the nonexistence of these funds.   CSW has called [employer] for rental assistance at [phone] and lvm.   Expected Discharge Plan:  (TBD) Barriers to Discharge: Homeless with medical needs  Expected Discharge Plan and Services Expected Discharge Plan: (TBD)       Living arrangements for the past 2 months: Skilled Nursing Facility                                       Social Determinants of Health (SDOH) Interventions    Readmission Risk Interventions No flowsheet data found.

## 2019-03-17 NOTE — Plan of Care (Signed)
  Problem: Education: Goal: Knowledge of General Education information will improve Description: Including pain rating scale, medication(s)/side effects and non-pharmacologic comfort measures Outcome: Progressing   Problem: Health Behavior/Discharge Planning: Goal: Ability to manage health-related needs will improve Outcome: Progressing   Problem: Pain Managment: Goal: General experience of comfort will improve Outcome: Progressing   
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
## 2019-03-18 LAB — CBC
HCT: 34.6 % — ABNORMAL LOW (ref 36.0–46.0)
Hemoglobin: 10.5 g/dL — ABNORMAL LOW (ref 12.0–15.0)
MCH: 23.9 pg — ABNORMAL LOW (ref 26.0–34.0)
MCHC: 30.3 g/dL (ref 30.0–36.0)
MCV: 78.8 fL — ABNORMAL LOW (ref 80.0–100.0)
Platelets: 187 10*3/uL (ref 150–400)
RBC: 4.39 MIL/uL (ref 3.87–5.11)
RDW: 17.6 % — ABNORMAL HIGH (ref 11.5–15.5)
WBC: 5.5 10*3/uL (ref 4.0–10.5)
nRBC: 0 % (ref 0.0–0.2)

## 2019-03-18 LAB — GLUCOSE, CAPILLARY
Glucose-Capillary: 179 mg/dL — ABNORMAL HIGH (ref 70–99)
Glucose-Capillary: 196 mg/dL — ABNORMAL HIGH (ref 70–99)
Glucose-Capillary: 191 mg/dL — ABNORMAL HIGH (ref 70–99)
Glucose-Capillary: 228 mg/dL — ABNORMAL HIGH (ref 70–99)

## 2019-03-18 LAB — BASIC METABOLIC PANEL
Anion gap: 16 — ABNORMAL HIGH (ref 5–15)
BUN: 81 mg/dL — ABNORMAL HIGH (ref 6–20)
CO2: 23 mmol/L (ref 22–32)
Calcium: 9 mg/dL (ref 8.9–10.3)
Chloride: 94 mmol/L — ABNORMAL LOW (ref 98–111)
Creatinine, Ser: 8.34 mg/dL — ABNORMAL HIGH (ref 0.44–1.00)
GFR calc Af Amer: 6 mL/min — ABNORMAL LOW (ref >60)
GFR calc non Af Amer: 5 mL/min — ABNORMAL LOW (ref >60)
Glucose, Bld: 188 mg/dL — ABNORMAL HIGH (ref 70–99)
Potassium: 5.5 mmol/L — ABNORMAL HIGH (ref 3.5–5.1)
Sodium: 133 mmol/L — ABNORMAL LOW (ref 135–145)

## 2019-03-18 MED ORDER — TRAZODONE HCL 50 MG PO TABS
50.0000 mg | ORAL_TABLET | Freq: Every evening | ORAL | Status: DC | PRN
  Administered 2019-03-19 – 2019-03-23 (×4): 50 mg via ORAL
  Filled 2019-03-18 (×5): qty 1

## 2019-03-18 MED ORDER — ALTEPLASE 2 MG IJ SOLR: 2.0000 mg | Freq: Once | INTRAMUSCULAR | Status: DC | PRN

## 2019-03-18 MED ORDER — GABAPENTIN 100 MG PO CAPS: 100.0000 mg | ORAL_CAPSULE | Freq: Three times a day (TID) | ORAL | Status: DC

## 2019-03-18 MED ORDER — SODIUM CHLORIDE 0.9 % IV SOLN: 100.0000 mL | INTRAVENOUS | Status: DC | PRN

## 2019-03-18 MED ORDER — HEPARIN SODIUM (PORCINE) 1000 UNIT/ML DIALYSIS: 1000.0000 [IU] | INTRAMUSCULAR | Status: DC | PRN

## 2019-03-18 MED ORDER — LIDOCAINE-PRILOCAINE 2.5-2.5 % EX CREA: 1.0000 "application " | TOPICAL_CREAM | CUTANEOUS | Status: DC | PRN

## 2019-03-18 MED ORDER — PENTAFLUOROPROP-TETRAFLUOROETH EX AERO: 1.0000 "application " | INHALATION_SPRAY | CUTANEOUS | Status: DC | PRN

## 2019-03-18 MED ORDER — QUETIAPINE FUMARATE 25 MG PO TABS
50.0000 mg | ORAL_TABLET | Freq: Every day | ORAL | Status: DC
  Administered 2019-03-18 – 2019-03-23 (×6): 50 mg via ORAL
  Filled 2019-03-18 (×6): qty 2

## 2019-03-18 MED ORDER — DARBEPOETIN ALFA 60 MCG/0.3ML IJ SOSY
PREFILLED_SYRINGE | INTRAMUSCULAR | Status: AC
  Administered 2019-03-18: 60 ug via INTRAVENOUS
  Filled 2019-03-18: qty 0.3

## 2019-03-18 MED ORDER — LIDOCAINE HCL (PF) 1 % IJ SOLN: 5.0000 mL | INTRAMUSCULAR | Status: DC | PRN

## 2019-03-18 NOTE — Progress Notes (Signed)
Pt had an episode of decreased LOC, VS were WNL for pt, CBG 179, her nuero checks were normal; pt had Neurontin 300, Trazadone 100, and Seroquel 100, which are her normal meds at night.  Pt is very sleepy will respond to pain and stimuli, but go right back to sleep.  ECG done showed NSR, Rapid Response called for support, MD called and notified and MD did come up to see her as well.  Pt will get dialysis today, Creatinine is 8.34 this am, pt may need her Trazadone d/c, will continue to monitor, Thanks Arvella Nigh RN.

## 2019-03-18 NOTE — TOC Progression Note (Signed)
Transition of Care [facility]) - Progression Note    Patient Details  Name: Charlierose Stagnitta MRN: [id number] Date of Birth: 1965-11-24  [facility]) CM/SW [place], [place] Phone Number: 03/18/2019, 2:44 PM  Clinical Narrative:     [facility]([phone]): Has two rooms available, upstairs. Pt not able to climb stairs. They said closer to discharge call them back to see if any new rooms have opened up. They said that if they have the funds, the could possibly put the patient up in a hotel but again that depends on funds.  [facility] [phone]): At [facility]([phone]): Willing to speak the patient regarding placement. Will need her to call independently.  [facility] [phone]): At [place] [phone]): Willing to speak with with patient regarding placement. Will need her to call independently.  [facility] [phone])- At [phone])- Left Message   CSW will continue to follow and assist with placement.   Expected Discharge Plan: (TBD) Barriers to Discharge: Homeless with medical needs  Expected Discharge Plan and Services Expected Discharge Plan: (TBD)       Living arrangements for the past 2 months: Skilled Nursing Facility                                       Social Determinants of Health (SDOH) Interventions    Readmission Risk Interventions No flowsheet data found.

## 2019-03-18 NOTE — Progress Notes (Signed)
MD, could u d/c her Trazadone, pt is very sleepy this am, was very hard to arouse, she may not need this med, Thanks [place] .
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
## 2019-03-18 NOTE — Significant Event (Signed)
Rapid Response Event Note  Overview:Called to evaluate pt with altered mental status/lethargy. Pt here for chf and htn urgency. Time Called: 0605 Arrival Time: 0610 Event Type: Neurologic  Initial Focused Assessment: Pt laying in bed with eyes closed, will respond only to repeated sternal rub. With continuous physical stimulation, pt will say her name, follow commands, move all extremities, and smile symmetrically.  Lungs clear and diminished, skin warm and dry.  T-97.3, HR-68, BP-96/65, RR-18, SpO2-100% on RA, CBG-179.   Pt received 100mg  seroquel, 100mg  trazodone, and 300mg  neurontin at 2200 last night. Pt last received HD 10/14 and is due to get it this AM. Interventions: CBG-179 EKG-NSR Blount and Dr. Justin Mend to bedside: Continuous tele monitoring.  HD this morning.  Plan of Care (if not transferred): Question whether this is medication related vs need for HD, or a combination of both. Place pt on cont tele for increased monitoring. Call RRT if further assistance needed. Event Summary: Name of Physician Notified: Kennon Holter, NP at [phone]    at          [place]
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
## 2019-03-19 LAB — CBC
HCT: 35.3 % — ABNORMAL LOW (ref 36.0–46.0)
Hemoglobin: 10.9 g/dL — ABNORMAL LOW (ref 12.0–15.0)
MCH: 24.3 pg — ABNORMAL LOW (ref 26.0–34.0)
MCHC: 30.9 g/dL (ref 30.0–36.0)
MCV: 78.8 fL — ABNORMAL LOW (ref 80.0–100.0)
Platelets: 216 10*3/uL (ref 150–400)
RBC: 4.48 MIL/uL (ref 3.87–5.11)
RDW: 17.6 % — ABNORMAL HIGH (ref 11.5–15.5)
WBC: 4.9 10*3/uL (ref 4.0–10.5)
nRBC: 0 % (ref 0.0–0.2)

## 2019-03-19 LAB — BASIC METABOLIC PANEL
Anion gap: 15 (ref 5–15)
BUN: 45 mg/dL — ABNORMAL HIGH (ref 6–20)
CO2: 24 mmol/L (ref 22–32)
Calcium: 8.7 mg/dL — ABNORMAL LOW (ref 8.9–10.3)
Chloride: 95 mmol/L — ABNORMAL LOW (ref 98–111)
Creatinine, Ser: 5.6 mg/dL — ABNORMAL HIGH (ref 0.44–1.00)
GFR calc Af Amer: 9 mL/min — ABNORMAL LOW (ref >60)
GFR calc non Af Amer: 8 mL/min — ABNORMAL LOW (ref >60)
Glucose, Bld: 270 mg/dL — ABNORMAL HIGH (ref 70–99)
Potassium: 4.2 mmol/L (ref 3.5–5.1)
Sodium: 134 mmol/L — ABNORMAL LOW (ref 135–145)

## 2019-03-19 LAB — GLUCOSE, CAPILLARY
Glucose-Capillary: 213 mg/dL — ABNORMAL HIGH (ref 70–99)
Glucose-Capillary: 185 mg/dL — ABNORMAL HIGH (ref 70–99)
Glucose-Capillary: 157 mg/dL — ABNORMAL HIGH (ref 70–99)
Glucose-Capillary: 172 mg/dL — ABNORMAL HIGH (ref 70–99)
Glucose-Capillary: 179 mg/dL — ABNORMAL HIGH (ref 70–99)
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
## 2019-03-19 NOTE — Progress Notes (Signed)
[facility] Progress Note  Subjective:   Vitals:   03/19/19 0608 03/19/19 0611 03/19/19 0846 03/19/19 1115  BP:  (!) 90/53 134/75 131/69  Pulse:  71 74 80  Resp:  18 16 16   Temp:  98.2 F (36.8 C) 98.5 F (36.9 C) 98.4 F (36.9 C)  TempSrc:   Oral Oral  SpO2:  98% 100% 100%  Weight: 85.7 kg     Height:        Inpatient medications: . aspirin EC  81 mg Oral Daily  . atomoxetine  40 mg Oral Daily  . calcium acetate  667 mg Oral TID WC  . Chlorhexidine Gluconate Cloth  6 each Topical Q0600  . Chlorhexidine Gluconate Cloth  6 each Topical Q0600  . clopidogrel  75 mg Oral Daily  . darbepoetin (ARANESP) injection - DIALYSIS  60 mcg Intravenous Q Fri-HD  . escitalopram  20 mg Oral Daily  . heparin  5,000 Units Subcutaneous Q8H  . insulin aspart  0-9 Units Subcutaneous TID WC  . latanoprost  1 drop Both Eyes QHS  . multivitamin  1 tablet Oral QHS  . polyethylene glycol  17 g Oral Daily  . QUEtiapine  50 mg Oral QHS  . sevelamer carbonate  2,400 mg Oral TID WC   . sodium chloride     sodium chloride, acetaminophen **OR** acetaminophen, albuterol, hydrALAZINE, influenza vac split quadrivalent PF, lidocaine (PF), lidocaine-prilocaine, ondansetron **OR** ondansetron (ZOFRAN) IV, pentafluoroprop-tetrafluoroeth, traZODone    Exam: Gen: NAD, lying in bed CVS: RRR Resp: clear anteriorly and posteriorly Abd: obese, NAD Ext: trace RLE edema   Previous [facility] unit- [place], records pending  Assessment/ Plan  Acute hypoxic RF- secondary to volume overload.  Resolved w HD  Hyperkalemia: resolved  ESRD: previously living in [place].  Getting HD here MWF. Next planned HD 10/19. Wants to dialyze here in [place] again.   Anemia ckd: getting darbepoetin 60 ug SQ q Friday  DM2 - on SSI here  Psych - on quetiapine 50 /d and hs prn trazodone  Dispo: will need OP CLIP but main issue is trying to find a women's shelter first.    Kelly Splinter 03/19/2019, 3:29 PM  Iron/TIBC/Ferritin/ %Sat    Component Value Date/Time   IRON 37 03/14/2019 0805   TIBC 185 (L) 03/14/2019 0805   FERRITIN 1,095 (H) 03/14/2019 0805   IRONPCTSAT 20 03/14/2019 0805   Recent Labs  Lab 03/16/19 0613  03/19/19 0457  NA 134*   < > 134*  K 5.0   < > 4.2  CL 94*   < > 95*  CO2 23   < > 24  GLUCOSE 171*   < > 270*  BUN 73*   < > 45*  CREATININE 8.59*   < > 5.60*  CALCIUM 9.5   < > 8.7*  PHOS 8.5*  --   --   ALBUMIN 3.6  --   --    < > = values in this interval not displayed.   No results for input(s): AST, ALT, ALKPHOS, BILITOT, PROT in the last 168 hours. Recent Labs  Lab 03/19/19 0457  WBC 4.9  HGB 10.9*  HCT 35.3*  PLT 216

## 2019-03-20 LAB — BASIC METABOLIC PANEL
Anion gap: 17 — ABNORMAL HIGH (ref 5–15)
BUN: 80 mg/dL — ABNORMAL HIGH (ref 6–20)
CO2: 23 mmol/L (ref 22–32)
Calcium: 9.5 mg/dL (ref 8.9–10.3)
Chloride: 96 mmol/L — ABNORMAL LOW (ref 98–111)
Creatinine, Ser: 8 mg/dL — ABNORMAL HIGH (ref 0.44–1.00)
GFR calc Af Amer: 6 mL/min — ABNORMAL LOW (ref >60)
GFR calc non Af Amer: 5 mL/min — ABNORMAL LOW (ref >60)
Glucose, Bld: 173 mg/dL — ABNORMAL HIGH (ref 70–99)
Potassium: 5.2 mmol/L — ABNORMAL HIGH (ref 3.5–5.1)
Sodium: 136 mmol/L (ref 135–145)

## 2019-03-20 LAB — GLUCOSE, CAPILLARY
Glucose-Capillary: 151 mg/dL — ABNORMAL HIGH (ref 70–99)
Glucose-Capillary: 145 mg/dL — ABNORMAL HIGH (ref 70–99)
Glucose-Capillary: 152 mg/dL — ABNORMAL HIGH (ref 70–99)
Glucose-Capillary: 194 mg/dL — ABNORMAL HIGH (ref 70–99)

## 2019-03-20 LAB — CBC
HCT: 37.3 % (ref 36.0–46.0)
Hemoglobin: 11.2 g/dL — ABNORMAL LOW (ref 12.0–15.0)
MCH: 23.7 pg — ABNORMAL LOW (ref 26.0–34.0)
MCHC: 30 g/dL (ref 30.0–36.0)
MCV: 79 fL — ABNORMAL LOW (ref 80.0–100.0)
Platelets: 173 10*3/uL (ref 150–400)
RBC: 4.72 MIL/uL (ref 3.87–5.11)
RDW: 17.5 % — ABNORMAL HIGH (ref 11.5–15.5)
WBC: 5.1 10*3/uL (ref 4.0–10.5)
nRBC: 0 % (ref 0.0–0.2)

## 2019-03-20 MED ORDER — CHLORHEXIDINE GLUCONATE CLOTH 2 % EX PADS
6.0000 | MEDICATED_PAD | Freq: Every day | CUTANEOUS | Status: DC
  Administered 2019-03-22: 6 via TOPICAL

## 2019-03-20 MED ORDER — TRAMADOL HCL 50 MG PO TABS
50.0000 mg | ORAL_TABLET | Freq: Two times a day (BID) | ORAL | Status: DC | PRN
  Administered 2019-03-20 – 2019-03-22 (×4): 50 mg via ORAL
  Filled 2019-03-20 (×3): qty 1

## 2019-03-20 MED ORDER — HYDRALAZINE HCL 20 MG/ML IJ SOLN
10.0000 mg | Freq: Once | INTRAMUSCULAR | Status: AC
  Administered 2019-03-20: 10 mg via INTRAVENOUS
  Filled 2019-03-20: qty 1
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
## 2019-03-20 NOTE — Progress Notes (Signed)
[facility] Progress Note  Subjective: no new c/o  Vitals:   03/20/19 0559 03/20/19 0601 03/20/19 1008 03/20/19 1218  BP:  (!) 174/79 (!) 143/74 (!) 172/78  Pulse:  79 79 80  Resp:  18 14 16   Temp:  98 F (36.7 C) 97.6 F (36.4 C) 98.2 F (36.8 C)  TempSrc:   Oral Oral  SpO2:  100% 97% 100%  Weight: 85.9 kg     Height:        Inpatient medications: . aspirin EC  81 mg Oral Daily  . atomoxetine  40 mg Oral Daily  . calcium acetate  667 mg Oral TID WC  . Chlorhexidine Gluconate Cloth  6 each Topical Q0600  . clopidogrel  75 mg Oral Daily  . darbepoetin (ARANESP) injection - DIALYSIS  60 mcg Intravenous Q Fri-HD  . escitalopram  20 mg Oral Daily  . heparin  5,000 Units Subcutaneous Q8H  . insulin aspart  0-9 Units Subcutaneous TID WC  . latanoprost  1 drop Both Eyes QHS  . multivitamin  1 tablet Oral QHS  . polyethylene glycol  17 g Oral Daily  . QUEtiapine  50 mg Oral QHS  . sevelamer carbonate  2,400 mg Oral TID WC    acetaminophen **OR** acetaminophen, albuterol, influenza vac split quadrivalent PF, ondansetron **OR** ondansetron (ZOFRAN) IV, traZODone    Exam: Gen: NAD, lying in bed CVS: RRR Resp: clear anteriorly and posteriorly Abd: obese, NAD Ext: trace RLE edema   Previous [facility] unit- [address] [place], records pending Here is getting 3.5- 4hr, 91kg >> new baseline 85-87kg here   Assessment/ Plan  Acute hypoxic RF- secondary to volume overload.  Resolved w HD  Hyperkalemia: resolved  ESRD: previously living in [place].  Getting HD here MWF. Next planned HD 10/19. Wants to dialyze here in [place] again.   Anemia ckd: getting darbepoetin 60 ug SQ q Friday  DM2 - on SSI here  Psych - on quetiapine 50 /d and hs prn trazodone  Dispo: will need OP CLIP but main issue is trying to find a women's shelter first.    Kelly Splinter 03/20/2019, 12:53 PM  Iron/TIBC/Ferritin/ %Sat    Component Value Date/Time   IRON 37 03/14/2019  0805   TIBC 185 (L) 03/14/2019 0805   FERRITIN 1,095 (H) 03/14/2019 0805   IRONPCTSAT 20 03/14/2019 0805   Recent Labs  Lab 03/16/19 0613  03/20/19 0642  NA 134*   < > 136  K 5.0   < > 5.2*  CL 94*   < > 96*  CO2 23   < > 23  GLUCOSE 171*   < > 173*  BUN 73*   < > 80*  CREATININE 8.59*   < > 8.00*  CALCIUM 9.5   < > 9.5  PHOS 8.5*  --   --   ALBUMIN 3.6  --   --    < > = values in this interval not displayed.   No results for input(s): AST, ALT, ALKPHOS, BILITOT, PROT in the last 168 hours. Recent Labs  Lab 03/20/19 0642  WBC 5.1  HGB 11.2*  HCT 37.3  PLT 173

## 2019-03-20 NOTE — Progress Notes (Addendum)
Pt BP 193/90.  [facility] notified.  Order received for 10mg  hydralazine .

## 2019-03-21 LAB — PHOSPHORUS: Phosphorus: 3.3 mg/dL (ref 2.5–4.6)

## 2019-03-21 LAB — GLUCOSE, CAPILLARY
Glucose-Capillary: 168 mg/dL — ABNORMAL HIGH (ref 70–99)
Glucose-Capillary: 199 mg/dL — ABNORMAL HIGH (ref 70–99)
Glucose-Capillary: 88 mg/dL (ref 70–99)
Glucose-Capillary: 153 mg/dL — ABNORMAL HIGH (ref 70–99)

## 2019-03-21 LAB — BASIC METABOLIC PANEL
Anion gap: 17 — ABNORMAL HIGH (ref 5–15)
BUN: 108 mg/dL — ABNORMAL HIGH (ref 6–20)
CO2: 22 mmol/L (ref 22–32)
Calcium: 9.2 mg/dL (ref 8.9–10.3)
Chloride: 92 mmol/L — ABNORMAL LOW (ref 98–111)
Creatinine, Ser: 10.87 mg/dL — ABNORMAL HIGH (ref 0.44–1.00)
GFR calc Af Amer: 4 mL/min — ABNORMAL LOW (ref >60)
GFR calc non Af Amer: 4 mL/min — ABNORMAL LOW (ref >60)
Glucose, Bld: 205 mg/dL — ABNORMAL HIGH (ref 70–99)
Potassium: 6.3 mmol/L (ref 3.5–5.1)
Sodium: 131 mmol/L — ABNORMAL LOW (ref 135–145)

## 2019-03-21 LAB — CBC
HCT: 33.9 % — ABNORMAL LOW (ref 36.0–46.0)
Hemoglobin: 10.5 g/dL — ABNORMAL LOW (ref 12.0–15.0)
MCH: 23.8 pg — ABNORMAL LOW (ref 26.0–34.0)
MCHC: 31 g/dL (ref 30.0–36.0)
MCV: 76.7 fL — ABNORMAL LOW (ref 80.0–100.0)
Platelets: 224 10*3/uL (ref 150–400)
RBC: 4.42 MIL/uL (ref 3.87–5.11)
RDW: 17.3 % — ABNORMAL HIGH (ref 11.5–15.5)
WBC: 19.6 10*3/uL — ABNORMAL HIGH (ref 4.0–10.5)
nRBC: 0 % (ref 0.0–0.2)

## 2019-03-21 MED ORDER — METOCLOPRAMIDE HCL 5 MG/ML IJ SOLN: 10.0000 mg | Freq: Once | INTRAMUSCULAR | Status: DC

## 2019-03-21 MED ORDER — TRAMADOL HCL 50 MG PO TABS
ORAL_TABLET | ORAL | Status: AC
  Filled 2019-03-21: qty 1

## 2019-03-21 MED ORDER — PROMETHAZINE HCL 25 MG/ML IJ SOLN
12.5000 mg | Freq: Once | INTRAMUSCULAR | Status: AC
  Administered 2019-03-21: 12.5 mg via INTRAVENOUS
  Filled 2019-03-21: qty 1

## 2019-03-21 MED ORDER — MORPHINE SULFATE (PF) 2 MG/ML IV SOLN: 2.0000 mg | Freq: Once | INTRAVENOUS | Status: DC

## 2019-03-21 MED ORDER — OXYCODONE HCL 5 MG PO TABS
5.0000 mg | ORAL_TABLET | Freq: Four times a day (QID) | ORAL | Status: AC | PRN
  Administered 2019-03-21 (×2): 5 mg via ORAL
  Filled 2019-03-21 (×2): qty 1

## 2019-03-21 MED ORDER — HEPARIN SODIUM (PORCINE) 1000 UNIT/ML IJ SOLN
INTRAMUSCULAR | Status: AC
  Administered 2019-03-21: 1000 [IU]
  Filled 2019-03-21: qty 4

## 2019-03-21 NOTE — TOC Progression Note (Signed)
Transition of Care [facility]) - Progression Note    Patient Details  Name: Lauren Warner MRN: [id number] Date of Birth: [birth year]  Transition of Care [facility]) CM/SW [place], [place] Phone Number: [phone] 03/21/2019, 1:17 PM  Clinical Narrative:     CSW spoke with [name] from [facility] to confirm they had received fax of patient's rental agreement that was sent on Friday 10/16. They confirm they received it however they have multiple questions and concerns regarding patient's application for housing assistance. Sherrie reports that they have not promised any payment towards housing for patient at this time, and reports patient does not appear to qualify for their assistance. Sherrie reports qualified persons for monetary assistance towards housing are for patients who have lost jobs/income due to covid and are therefore unable to pay rent. They area able to provide monetary assistance to these families. Sherrie reports she believes patient was trying to lie to get approved with them, however she is currently aware that patient is from a [facility] in [place]. Sherrie also expressed concerns that patient needs higher level of care than going home alone, and is also in agreement with SNF recommendation. Sherrie is following up with her director Rosann Auerbach to see if patient may qualify for any other forms of assistance at this time.  Expected Discharge Plan: (TBD) Barriers to Discharge: Homeless with medical needs  Expected Discharge Plan and Services Expected Discharge Plan: (TBD)       Living arrangements for the past 2 months: Skilled Nursing Facility                                       Social Determinants of Health (SDOH) Interventions    Readmission Risk Interventions No flowsheet data found.

## 2019-03-21 NOTE — Progress Notes (Signed)
Physical Therapy Cancellation Note   03/21/19 1457  PT Visit Information  Last PT Received On 03/21/19  Reason Eval/Treat Not Completed Patient at procedure or test/unavailable. Pt is off unit for HD. PT will continue to follow acutely.   Earney Navy, PTA Acute Rehabilitation Services Pager: [phone] Office: [phone]

## 2019-03-21 NOTE — Progress Notes (Signed)
Notified of K 6.3  Stat EKG  Patient is for HD today. I notified Dr. Jonnie Finner of patients K and he is to prioritize her treatment today.   Thank you, Marva Panda, DO

## 2019-03-21 NOTE — Progress Notes (Addendum)
[facility] Progress Note   Subjective:   Patient seen and examined at bedside. States she feels terrible.  Reports 9/10 phantom pain in LLE starting last night.  Also with nausea/vomiting and muscle aches starting last night.  Denies OCB, abdominal pain, CP, edema and weakness.   Objective Vitals:   03/21/19 0029 03/21/19 0620 03/21/19 0825 03/21/19 1213  BP: 124/72 (!) 142/66 120/63 131/60  Pulse: 89 (!) 102 (!) 101 92  Resp: 18  18 19   Temp: 98.5 F (36.9 C) 98.4 F (36.9 C) 97.8 F (36.6 C) 98 F (36.7 C)  TempSrc: Oral Oral Oral Oral  SpO2: 100% 93% 98% 96%  Weight:  87.3 kg    Height:       Physical Exam General:NAD, chronically ill appearing female, laying in bed Heart:RRR Lungs: mostly CTAB, +wheezing on L Abdomen:soft, NTND Extremities:no LE edema, L BKA Dialysis Access: LU AVF +b   Filed Weights   03/19/19 0608 03/20/19 0559 03/21/19 0620  Weight: 85.7 kg 85.9 kg 87.3 kg    Intake/Output Summary (Last 24 hours) at 03/21/2019 1303 Last data filed at 03/21/2019 0900 Gross per 24 hour  Intake 240 ml  Output -  Net 240 ml    Additional Objective Labs: Basic Metabolic Panel: Recent Labs  Lab 03/16/19 0613  03/19/19 0457 03/20/19 0642 03/21/19 1056  NA 134*   < > 134* 136 131*  K 5.0   < > 4.2 5.2* 6.3*  CL 94*   < > 95* 96* 92*  CO2 23   < > 24 23 22   GLUCOSE 171*   < > 270* 173* 205*  BUN 73*   < > 45* 80* 108*  CREATININE 8.59*   < > 5.60* 8.00* 10.87*  CALCIUM 9.5   < > 8.7* 9.5 9.2  PHOS 8.5*  --   --   --   --    < > = values in this interval not displayed.   Liver Function Tests: Recent Labs  Lab 03/16/19 0613  ALBUMIN 3.6   CBC: Recent Labs  Lab 03/17/19 0437 03/18/19 0348 03/19/19 0457 03/20/19 0642 03/21/19 1056  WBC 5.1 5.5 4.9 5.1 19.6*  HGB 10.8* 10.5* 10.9* 11.2* 10.5*  HCT 36.3 34.6* 35.3* 37.3 33.9*  MCV 79.1* 78.8* 78.8* 79.0* 76.7*  PLT 188 187 216 173 224    Recent Labs  Lab 03/20/19 1215  03/20/19 1712 03/20/19 2144 03/21/19 0618 03/21/19 1258  GLUCAP 145* 152* 194* 168* 199*   Medications:  . aspirin EC  81 mg Oral Daily  . atomoxetine  40 mg Oral Daily  . calcium acetate  667 mg Oral TID WC  . Chlorhexidine Gluconate Cloth  6 each Topical Q0600  . clopidogrel  75 mg Oral Daily  . darbepoetin (ARANESP) injection - DIALYSIS  60 mcg Intravenous Q Fri-HD  . escitalopram  20 mg Oral Daily  . heparin  5,000 Units Subcutaneous Q8H  . insulin aspart  0-9 Units Subcutaneous TID WC  . latanoprost  1 drop Both Eyes QHS  . metoCLOPramide (REGLAN) injection  10 mg Intravenous Once  .  morphine injection  2 mg Intravenous Once  . multivitamin  1 tablet Oral QHS  . polyethylene glycol  17 g Oral Daily  . QUEtiapine  50 mg Oral QHS  . sevelamer carbonate  2,400 mg Oral TID WC    Dialysis Orders: Previous [facility] unit- [place], records pending Here is getting 3.5- 4hr, 91kg >>  new baseline 85-88kg here  Assessment/Plan: 1. Acute hypoxic RF 2/2 volume overload - resolved w/HD 2. Hyperkalemia - initially resolved,  But K 6.3 again today - plan for urgent HD . 3. SP L BKA w/phantom pain - per primary 4. N/V - afebrile, BUN elevated 108, possibly uremic? Will see if improvement post HD. 5. ESRD - Inpt HD schedule MWF, previously living in [place]. Want to dialyze in [place], [place] initiated.    6. Anemia of CKD- Hgb 10.5. Aranesp 55mcg qwk (Friday) 7. Secondary hyperparathyroidism - Ca at goal. Check phos. Continue renvela/phoslo. 8. HTN/volume - BP well controlled. Does not appear volume overloaded. Titrate down as tolerated.  9. Nutrition - Renal diet w/fluid restrictions.  10. DMT2 - per primary 11. Psych - on lexapro & quetiapine 50 qd & hs prn trazadone 12. Dispo - we need OP CLIP but main issue is finding women's shelter w/handicap access.   Jen Mow, PA-C [facility] Pager: [phone] 03/21/2019,1:03 PM  LOS: 11 days    Pt seen, examined and agree w A/P as above.  Kelly Splinter  MD 03/21/2019, 3:23 PM

## 2019-03-21 NOTE — Progress Notes (Signed)
Pt is currently having a bath. Nurse tech with pt present at this moment. CCMD called.

## 2019-03-21 NOTE — Care Management Important Message (Signed)
Important Message  Patient Details  Name: Lauren Warner MRN: [id number] Date of Birth: 1965/07/20   Medicare Important Message Given:  Yes     Shelda Altes 03/21/2019, 1:21 PM

## 2019-03-21 NOTE — Progress Notes (Signed)
Spoke with hemodialysis receiving RN, will come to pick patient up in 30-40 minutes.

## 2019-03-21 NOTE — Progress Notes (Signed)
Patient complained of severe phantom pain and nausea while primary RN was busy with another patient.  Another RN received order for zofran and oxy.  Pt given both of these.  Patient then has severe episode of emesis, still complaining of severe pain.  Phenergan given to patient at order from NP.  Patient still complaining of pain.  Patient has another episode of emesis.  Primary RN paged provider to notify. Orders received for reglan and morphine IV.  Patient asleep when RN returned with meds.
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
## 2019-03-21 NOTE — Progress Notes (Signed)
CRITICAL VALUE ALERT  Critical Value:  Potassium 6.3  Date & Time Notied:  03/21/2019 12:02  Provider Notified: Neysa Bonito, MD

## 2019-03-21 NOTE — Progress Notes (Signed)
Pt is back from HD. Pt is in cardiac monitor. CCMD called. Pt is awake and oriented to person, place, situation and time.  Vitals are as follows:   03/21/19 1755  Vitals  Temp 98.5 F (36.9 C)  Temp Source Oral  BP 127/64  MAP (mmHg) 82  BP Method Automatic  Pulse Rate 92  Pulse Rate Source Monitor  Resp 18  Oxygen Therapy  SpO2 100 %  O2 Device Room Air  MEWS Score  MEWS RR 0  MEWS Pulse 0  MEWS Systolic 0  MEWS LOC 0  MEWS Temp 0  MEWS Score 0  MEWS Score Color Green

## 2019-03-21 NOTE — Progress Notes (Signed)
Patient states she feels "achy like when you're getting the flu", tylenol given. Temp WNL. Will continue to monitor throughout the shift. Also says she is still having slight phantom pain on and off.

## 2019-03-21 NOTE — Progress Notes (Signed)
OT Cancellation Note  Patient Details Name: Lauren Warner MRN: [id number] DOB: 1965-12-18   Cancelled Treatment:    Reason Eval/Treat Not Completed: Patient at procedure or test/ unavailable. Pt off the floor for dialysis this afternoon. OT will reattempt when pt is available.   Darleen Crocker P MS, OTR/L 03/21/2019, 1:38 PM

## 2019-03-22 LAB — CBC
HCT: 34.4 % — ABNORMAL LOW (ref 36.0–46.0)
Hemoglobin: 10.8 g/dL — ABNORMAL LOW (ref 12.0–15.0)
MCH: 24.4 pg — ABNORMAL LOW (ref 26.0–34.0)
MCHC: 31.4 g/dL (ref 30.0–36.0)
MCV: 77.7 fL — ABNORMAL LOW (ref 80.0–100.0)
Platelets: 204 10*3/uL (ref 150–400)
RBC: 4.43 MIL/uL (ref 3.87–5.11)
RDW: 17.7 % — ABNORMAL HIGH (ref 11.5–15.5)
WBC: 11.3 10*3/uL — ABNORMAL HIGH (ref 4.0–10.5)
nRBC: 0 % (ref 0.0–0.2)

## 2019-03-22 LAB — BASIC METABOLIC PANEL
Anion gap: 15 (ref 5–15)
Anion gap: 18 — ABNORMAL HIGH (ref 5–15)
BUN: 37 mg/dL — ABNORMAL HIGH (ref 6–20)
BUN: 43 mg/dL — ABNORMAL HIGH (ref 6–20)
CO2: 27 mmol/L (ref 22–32)
CO2: 25 mmol/L (ref 22–32)
Calcium: 9.1 mg/dL (ref 8.9–10.3)
Calcium: 9.3 mg/dL (ref 8.9–10.3)
Chloride: 95 mmol/L — ABNORMAL LOW (ref 98–111)
Chloride: 93 mmol/L — ABNORMAL LOW (ref 98–111)
Creatinine, Ser: 5.89 mg/dL — ABNORMAL HIGH (ref 0.44–1.00)
Creatinine, Ser: 6.82 mg/dL — ABNORMAL HIGH (ref 0.44–1.00)
GFR calc Af Amer: 9 mL/min — ABNORMAL LOW (ref >60)
GFR calc Af Amer: 7 mL/min — ABNORMAL LOW (ref >60)
GFR calc non Af Amer: 8 mL/min — ABNORMAL LOW (ref >60)
GFR calc non Af Amer: 6 mL/min — ABNORMAL LOW (ref >60)
Glucose, Bld: 96 mg/dL (ref 70–99)
Glucose, Bld: 186 mg/dL — ABNORMAL HIGH (ref 70–99)
Potassium: 4 mmol/L (ref 3.5–5.1)
Potassium: 4.2 mmol/L (ref 3.5–5.1)
Sodium: 137 mmol/L (ref 135–145)
Sodium: 136 mmol/L (ref 135–145)

## 2019-03-22 LAB — GLUCOSE, CAPILLARY
Glucose-Capillary: 145 mg/dL — ABNORMAL HIGH (ref 70–99)
Glucose-Capillary: 133 mg/dL — ABNORMAL HIGH (ref 70–99)
Glucose-Capillary: 213 mg/dL — ABNORMAL HIGH (ref 70–99)
Glucose-Capillary: 155 mg/dL — ABNORMAL HIGH (ref 70–99)

## 2019-03-22 LAB — INFLUENZA PANEL BY PCR (TYPE A & B)
Influenza A By PCR: NEGATIVE
Influenza B By PCR: NEGATIVE

## 2019-03-22 LAB — MAGNESIUM: Magnesium: 2.3 mg/dL (ref 1.7–2.4)

## 2019-03-22 MED ORDER — CHLORHEXIDINE GLUCONATE CLOTH 2 % EX PADS
6.0000 | MEDICATED_PAD | Freq: Every day | CUTANEOUS | Status: DC
  Administered 2019-03-22: 6 via TOPICAL

## 2019-03-22 MED ORDER — GABAPENTIN 100 MG PO CAPS
100.0000 mg | ORAL_CAPSULE | Freq: Three times a day (TID) | ORAL | Status: DC
  Administered 2019-03-22 – 2019-03-24 (×7): 100 mg via ORAL
  Filled 2019-03-22 (×7): qty 1

## 2019-03-22 MED ORDER — HYDRALAZINE HCL 20 MG/ML IJ SOLN
5.0000 mg | Freq: Once | INTRAMUSCULAR | Status: AC
  Administered 2019-03-22: 5 mg via INTRAVENOUS
  Filled 2019-03-22: qty 1
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
## 2019-03-22 NOTE — Plan of Care (Signed)
  Problem: Education: Goal: Knowledge of General Education information will improve Description Including pain rating scale, medication(s)/side effects and non-pharmacologic comfort measures Outcome: Adequate for Discharge   Problem: Health Behavior/Discharge Planning: Goal: Ability to manage health-related needs will improve Outcome: Adequate for Discharge   

## 2019-03-22 NOTE — Plan of Care (Signed)
  Problem: Health Behavior/Discharge Planning: Goal: Ability to manage health-related needs will improve Outcome: Progressing   Problem: Education: Goal: Ability to demonstrate management of disease process will improve Outcome: Progressing

## 2019-03-22 NOTE — Progress Notes (Signed)
Orthopedic Tech Progress Note Patient Details:  Lauren Warner [birth year] [id number] RN called requesting an AKA STUMP SHRINKER for patient so I told her I would call [facility] and place an order for patient Patient ID: Lauren Warner, female   DOB: Mar 17, 1966, 53 y.o.   MRN: [id number]   [name] 03/22/2019, 8:46 AM

## 2019-03-22 NOTE — Progress Notes (Signed)
OT Cancellation Note  Patient Details Name: Lauren Warner MRN: [id number] DOB: 1966/04/12   Cancelled Treatment:    Reason Eval/Treat Not Completed: Medical issues which prohibited therapy;Other (comment) Pt states, "shes not feeling well today." Requested OT to come back. Will check back as time allows.   [place], [facility] [place] 03/22/2019, 11:50 AM

## 2019-03-22 NOTE — Progress Notes (Signed)
Physical Therapy Treatment Patient Details Name: Lauren Warner MRN: [id number] DOB: 1966-02-04 Today's Date: 03/22/2019    History of Present Illness Pt is a 53 y.o. female admitted 03/09/19 with chest pain in the seitting of missing HD treatments and noncompliance with medications. Pt reports having L AKA 2 months prior and has been at [facility] in [place] until now. Other PMH includes ESRD (HD MWF), HTN, DM, anemia, CAD, anxiety, depression, PE; recent COVID-19 infection.   PT Comments    Pt declining transfer or gait training due to fatigue/myalgias, "They're testing me to see if I have the flu." Agreeable to seated LE therex, amputee education and phantom limb pain intervention. Pt continues to decline SNF; when asked about d/c plan, pt reports she is still working on getting new home. Pt demonstrates poor insight into current condition and need for independence to safely d/c home alone. Will follow acutely.    Follow Up Recommendations  SNF;Supervision/Assistance - 24 hour(pt declined SNF)     Equipment Recommendations  Rolling walker with 5" wheels;3in1 (PT)(tub bench)    Recommendations for Other Services       Precautions / Restrictions Precautions Precautions: Fall Precaution Comments: L AKA     Mobility  Bed Mobility               General bed mobility comments: Received sitting in recliner  Transfers                 General transfer comment: Declined due to fatigue  Ambulation/Gait                 Stairs             Wheelchair Mobility    Modified Rankin (Stroke Patients Only)       Balance Overall balance assessment: Needs assistance   Sitting balance-Leahy Scale: Good Sitting balance - Comments: Indep to come to edge of recliner for seated LE therex                                    Cognition Arousal/Alertness: Awake/alert Behavior During Therapy: WFL for tasks assessed/performed Overall Cognitive Status:  Impaired/Different from baseline Area of Impairment: Safety/judgement;Awareness;Problem solving;Memory                     Memory: Decreased short-term memory   Safety/Judgement: Decreased awareness of safety;Decreased awareness of deficits Awareness: Emergent Problem Solving: Slow processing General Comments: Following commands and interacting appropriately; generally seems slow to process some information and poor insight into deficits. Continues to think she is d/c to new home and can do so independently. Reports COVID infection affected her memory      Exercises Amputee Exercises Hip ABduction/ADduction: AROM;Both;Seated Hip Flexion/Marching: AROM;Both;Seated Knee Extension: AROM;Right;Seated Straight Leg Raises: AROM;Right;Seated Other Exercises Other Exercises: Educ on importance of hip flexor stretch (laying in supine fully to stretch)    General Comments        Pertinent Vitals/Pain Pain Assessment: Faces Faces Pain Scale: Hurts a little bit Pain Location: LLE phantom limb pain Pain Descriptors / Indicators: Sharp Pain Intervention(s): Other (comment)(educ on techniques for phantom limb pain)    Home Living                      Prior Function            PT Goals (current goals can now be found in  the care plan section) Progress towards PT goals: Progressing toward goals    Frequency    Min 3X/week      PT Plan Current plan remains appropriate    Co-evaluation              AM-PAC PT "6 Clicks" Mobility   Outcome Measure  Help needed turning from your back to your side while in a flat bed without using bedrails?: None Help needed moving from lying on your back to sitting on the side of a flat bed without using bedrails?: None Help needed moving to and from a bed to a chair (including a wheelchair)?: A Little Help needed standing up from a chair using your arms (e.g., wheelchair or bedside chair)?: A Little Help needed to walk in  hospital room?: A Little Help needed climbing 3-5 steps with a railing? : Total 6 Click Score: 18    End of Session   Activity Tolerance: Patient limited by fatigue Patient left: in chair;with call bell/phone within reach Nurse Communication: Mobility status PT Visit Diagnosis: Unsteadiness on feet (R26.81);Muscle weakness (generalized) (M62.81);Difficulty in walking, not elsewhere classified (R26.2)     Time: [id number] PT Time Calculation (min) (ACUTE ONLY): 15 min  Charges:  $Therapeutic Exercise: 8-22 mins                    Mabeline Caras, PT, DPT Acute Rehabilitation Services  Pager [phone] Office [place] 03/22/2019, 1:39 PM

## 2019-03-22 NOTE — TOC Progression Note (Addendum)
Transition of Care [facility]) - Progression Note    Patient Details  Name: Lauren Warner MRN: [id number] Date of Birth: 1965-12-13  Transition [facility] CM/SW [place], [place] Phone Number: [phone] 03/22/2019, 8:44 AM  Clinical Narrative:     Update: Debbie called back after review of patient's file they report concerns with patient living alone in hotel room, acknowledging PT notes. Patient's most recent PT notes report she continues to need assistance walking and has difficulty walking by herself due to weakness and fatigue. [facility] program will continue to follow should patient progress towards independent mobility and then qualify for their program. CSW has informed supervisor of this information, both in agreement that at this time patient will be unsafe to be discharged to women's home in a room alone without any assistance, as patient continues to lack ability to be independently mobile. Safest discharge plan at this time continues to be SNF as recommended by therapies, however patient continues to refuse. Patient continues to deny any family or friend support at this time.      CSW has continued to reach out to women's homes, feedback continues to be facilities at capacity or facilities not able to accommodate first floor room needs due to patient's left leg amputation.   CSW has consulted with supervisor Barbette Or who provided contact of Durenda Age at [phone] wit [facility] project for potential hotel housing at time of discharge. CSW called, vm full, sent text message asking for return call. Pending response at this time.     Expected Discharge Plan: (TBD) Barriers to Discharge: Homeless with medical needs  Expected Discharge Plan and Services Expected Discharge Plan: (TBD)       Living arrangements for the past 2 months: Skilled Nursing Facility                                       Social Determinants of Health (SDOH) Interventions     Readmission Risk Interventions No flowsheet data found.
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
## 2019-03-22 NOTE — Progress Notes (Signed)
Dr. Neysa Bonito notified about patient's HR and B/P. Also informed him about patient complaining of generalized pain and overall not feeling well. New orders will be placed.

## 2019-03-23 LAB — GLUCOSE, CAPILLARY
Glucose-Capillary: 112 mg/dL — ABNORMAL HIGH (ref 70–99)
Glucose-Capillary: 95 mg/dL (ref 70–99)
Glucose-Capillary: 188 mg/dL — ABNORMAL HIGH (ref 70–99)
Glucose-Capillary: 148 mg/dL — ABNORMAL HIGH (ref 70–99)

## 2019-03-23 LAB — BASIC METABOLIC PANEL
Anion gap: 16 — ABNORMAL HIGH (ref 5–15)
BUN: 51 mg/dL — ABNORMAL HIGH (ref 6–20)
CO2: 25 mmol/L (ref 22–32)
Calcium: 9.1 mg/dL (ref 8.9–10.3)
Chloride: 92 mmol/L — ABNORMAL LOW (ref 98–111)
Creatinine, Ser: 7.88 mg/dL — ABNORMAL HIGH (ref 0.44–1.00)
GFR calc Af Amer: 6 mL/min — ABNORMAL LOW (ref >60)
GFR calc non Af Amer: 5 mL/min — ABNORMAL LOW (ref >60)
Glucose, Bld: 103 mg/dL — ABNORMAL HIGH (ref 70–99)
Potassium: 4.4 mmol/L (ref 3.5–5.1)
Sodium: 133 mmol/L — ABNORMAL LOW (ref 135–145)

## 2019-03-23 LAB — CBC
HCT: 34.5 % — ABNORMAL LOW (ref 36.0–46.0)
Hemoglobin: 10.8 g/dL — ABNORMAL LOW (ref 12.0–15.0)
MCH: 24.5 pg — ABNORMAL LOW (ref 26.0–34.0)
MCHC: 31.3 g/dL (ref 30.0–36.0)
MCV: 78.2 fL — ABNORMAL LOW (ref 80.0–100.0)
Platelets: 202 10*3/uL (ref 150–400)
RBC: 4.41 MIL/uL (ref 3.87–5.11)
RDW: 18 % — ABNORMAL HIGH (ref 11.5–15.5)
WBC: 7.8 10*3/uL (ref 4.0–10.5)
nRBC: 0 % (ref 0.0–0.2)

## 2019-03-23 LAB — PHOSPHORUS: Phosphorus: 5.8 mg/dL — ABNORMAL HIGH (ref 2.5–4.6)

## 2019-03-23 MED ORDER — SODIUM CHLORIDE 0.9 % IV SOLN: 100.0000 mL | INTRAVENOUS | Status: DC | PRN

## 2019-03-23 MED ORDER — ALTEPLASE 2 MG IJ SOLR: 2.0000 mg | Freq: Once | INTRAMUSCULAR | Status: DC | PRN

## 2019-03-23 MED ORDER — LIDOCAINE-PRILOCAINE 2.5-2.5 % EX CREA: 1.0000 "application " | TOPICAL_CREAM | CUTANEOUS | Status: DC | PRN

## 2019-03-23 MED ORDER — LIDOCAINE HCL (PF) 1 % IJ SOLN: 5.0000 mL | INTRAMUSCULAR | Status: DC | PRN

## 2019-03-23 MED ORDER — PENTAFLUOROPROP-TETRAFLUOROETH EX AERO: 1.0000 "application " | INHALATION_SPRAY | CUTANEOUS | Status: DC | PRN

## 2019-03-23 MED ORDER — HEPARIN SODIUM (PORCINE) 1000 UNIT/ML DIALYSIS: 1000.0000 [IU] | INTRAMUSCULAR | Status: DC | PRN
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
## 2019-03-23 NOTE — Progress Notes (Signed)
Pt states that she feels she is getting depressed D/T her housing situation not being established. She says she doesn't feel well and feels tired all the time because of the situation.

## 2019-03-23 NOTE — Progress Notes (Signed)
PT Cancellation Note  Patient Details Name: Lauren Warner MRN: [id number] DOB: 1965/12/18   Cancelled Treatment:    Reason Eval/Treat Not Completed: Patient at procedure or test/unavailable; patient in HD.  Will attempt to see later if able.    Reginia Naas 03/23/2019, 10:25 AM  Magda Kiel, [place] [phone] 03/23/2019

## 2019-03-23 NOTE — Progress Notes (Signed)
OT Cancellation Note  Patient Details Name: Lauren Warner MRN: [id number] DOB: 03/24/1966   Cancelled Treatment:    Reason Eval/Treat Not Completed: Patient at procedure or test/ unavailable;Other (comment) Pt out of room at HD; will f/u as time allows for OT session.  [place], [facility] [place] 03/23/2019, 9:17 AM
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
## 2019-03-23 NOTE — Procedures (Signed)
   I was present at this dialysis session, have reviewed the session itself and made  appropriate changes Kelly Splinter MD [place] pager [phone]   03/23/2019, 9:27 AM

## 2019-03-23 NOTE — Progress Notes (Signed)
Patient asked to speak with supervisor due to her broken anklet. Issue resolved. Patient also expresses that she will not go back to a SNF as she will be getting her own place.

## 2019-03-23 NOTE — Progress Notes (Signed)
Physical Therapy Treatment Patient Details Name: Lauren Warner MRN: [id number] DOB: 22-May-1966 Today's Date: 03/23/2019    History of Present Illness Pt is a 53 y.o. female admitted 03/09/19 with chest pain in the seitting of missing HD treatments and noncompliance with medications. Pt reports having L AKA 2 months prior and has been at [facility] in [place] until now. Other PMH includes ESRD (HD MWF), HTN, DM, anemia, CAD, anxiety, depression, PE; recent COVID-19 infection.    PT Comments    Patient presents mod independent with mobility at wheelchair level.  She demonstrated safe wheelchair set up and getting chair she was going to set up safely and she propelled wheelchair in hallway and in her room independently.  Spoke about her ongoing plans to get a home, but per SW may not be ready for awhile.  Feel she can safely d/c to safe house in community with assist for meals/showers via HHPT/aide.  She continues to need skilled PT and assist for safety with ambulation and she verbalized understanding of this.   PT to follow acutely.  Follow Up Recommendations  Home health PT;Supervision - Intermittent(HH aide)     Equipment Recommendations  Other (comment)(toilet riser and tub bench)    Recommendations for Other Services       Precautions / Restrictions Precautions Precautions: Fall Precaution Comments: L AKA  Required Braces or Orthoses: Other Brace Other Brace: shrinker for L AKA Restrictions Weight Bearing Restrictions: No    Mobility  Bed Mobility               General bed mobility comments: Received sitting in recliner  Transfers   Equipment used: Rolling walker (2 wheeled) Transfers: Sit to/from [employer] Sit to Stand: Min guard   Squat pivot transfers: Modified independent (Device/Increase time)     General transfer comment: assist for balance and cues for safety sit to stand  Ambulation/Gait Ambulation/Gait assistance: Min guard Gait  Distance (Feet): 10 Feet Assistive device: Rolling walker (2 wheeled) Gait Pattern/deviations: Step-to pattern     General Gait Details: cues for positioning safely in walker and pushing it out far enough   Secretary/administrator Wheelchair mobility: Yes Wheelchair propulsion: Both upper extremities;Right lower extremity Wheelchair parts: Independent Distance: 150 Wheelchair Assistance Details (indicate cue type and reason): able to manage chair safely, has no leg rests  Modified Rankin (Stroke Patients Only)       Balance Overall balance assessment: Needs assistance   Sitting balance-Leahy Scale: Good     Standing balance support: Bilateral upper extremity supported Standing balance-Leahy Scale: Poor Standing balance comment: UE support for static standing balance with L AKA                            Cognition Arousal/Alertness: Awake/alert Behavior During Therapy: WFL for tasks assessed/performed Overall Cognitive Status: Impaired/Different from baseline                       Memory: Decreased short-term memory         General Comments: oriented and pleasant, describing in detail plans for getting her home set up, but forgot her shrinker on the floor      Exercises      General Comments General comments (skin integrity, edema, etc.): Demonstrated safe technique wtih positioning chair she was transferring to and her w/c for chair  to chair transfer      Pertinent Vitals/Pain Pain Assessment: Faces Faces Pain Scale: Hurts little more Pain Location: LLE phantom limb pain Pain Descriptors / Indicators: Pressure;Stabbing Pain Intervention(s): Monitored during session;Repositioned(educated on tapping, stroking, textures for sensory input)    Home Living                      Prior Function            PT Goals (current goals can now be found in the care plan section) Progress towards  PT goals: Progressing toward goals    Frequency    Min 3X/week      PT Plan Discharge plan needs to be updated    Co-evaluation              AM-PAC PT "6 Clicks" Mobility   Outcome Measure  Help needed turning from your back to your side while in a flat bed without using bedrails?: None Help needed moving from lying on your back to sitting on the side of a flat bed without using bedrails?: None Help needed moving to and from a bed to a chair (including a wheelchair)?: None Help needed standing up from a chair using your arms (e.g., wheelchair or bedside chair)?: A Little Help needed to walk in hospital room?: A Little Help needed climbing 3-5 steps with a railing? : Total 6 Click Score: 19    End of Session Equipment Utilized During Treatment: Gait belt Activity Tolerance: Patient tolerated treatment well Patient left: in chair;with call bell/phone within reach   PT Visit Diagnosis: Other abnormalities of gait and mobility (R26.89);Difficulty in walking, not elsewhere classified (R26.2)     Time: [id number] PT Time Calculation (min) (ACUTE ONLY): 29 min  Charges:  $Gait Training: 8-22 mins $Wheel Chair Management: 8-22 mins                     Magda Kiel, [place] [facility] [phone] 03/23/2019    Reginia Naas 03/23/2019, 3:13 PM

## 2019-03-23 NOTE — TOC Progression Note (Signed)
Transition of Care [facility]) - Progression Note    Patient Details  Name: Lauren Warner MRN: [id number] Date of Birth: 1966/02/15  Transition of Care [facility]) CM/SW [place], [place] Phone Number: [phone] 03/23/2019, 4:47 PM  Clinical Narrative:     CSW and supervisor Denyse Amass spoke with patient regarding discharge plan and informed her of acceptance to [facility] program. Patient reports her rental home is now ready for her. CSW inquired how long [facility] will take to provide funds for patient to move into rental home, patient does not know. CSW, Denyse Amass, and patient called [facility].They are requesting  CSW send patient's covid results from when she tested positive at [facility]. CSW is emailing to them. They report they will review patient's chart and continue the application process for funds however this could take some time. All in agreement for patient to go to hotel for short term until funds secured with [facility] for patient to go to rental home.   CSW informed Terri Piedra , renal navigator of plan. She is working in HD placement for patient and then patient can be discharged to hotel through [employer].   Expected Discharge Plan: (TBD) Barriers to Discharge: Homeless with medical needs  Expected Discharge Plan and Services Expected Discharge Plan: (TBD)       Living arrangements for the past 2 months: Skilled Nursing Facility                                       Social Determinants of Health (SDOH) Interventions    Readmission Risk Interventions No flowsheet data found.

## 2019-03-23 NOTE — Progress Notes (Signed)
Patient states her rental home will be ready once documentation is faxed stating that she has survived COVID. She reports that the house is on [address] in [place], which is closest to the [employer] Renal Navigator has completed and submitted referral to [facility] Admissions to request OP HD treatment at the [facility]. Renal Navigator will continue to follow.  Alphonzo Cruise, [place] Renal Navigator [phone]

## 2019-03-24 ENCOUNTER — Telehealth: Payer: Self-pay

## 2019-03-24 DIAGNOSIS — Z89512 Acquired absence of left leg below knee: Secondary | ICD-10-CM

## 2019-03-24 LAB — BASIC METABOLIC PANEL
Anion gap: 16 — ABNORMAL HIGH (ref 5–15)
BUN: 28 mg/dL — ABNORMAL HIGH (ref 6–20)
CO2: 25 mmol/L (ref 22–32)
Calcium: 8.7 mg/dL — ABNORMAL LOW (ref 8.9–10.3)
Chloride: 94 mmol/L — ABNORMAL LOW (ref 98–111)
Creatinine, Ser: 5.5 mg/dL — ABNORMAL HIGH (ref 0.44–1.00)
GFR calc Af Amer: 10 mL/min — ABNORMAL LOW (ref >60)
GFR calc non Af Amer: 8 mL/min — ABNORMAL LOW (ref >60)
Glucose, Bld: 127 mg/dL — ABNORMAL HIGH (ref 70–99)
Potassium: 3.8 mmol/L (ref 3.5–5.1)
Sodium: 135 mmol/L (ref 135–145)

## 2019-03-24 LAB — GLUCOSE, CAPILLARY
Glucose-Capillary: 155 mg/dL — ABNORMAL HIGH (ref 70–99)
Glucose-Capillary: 103 mg/dL — ABNORMAL HIGH (ref 70–99)

## 2019-03-24 MED ORDER — TRAZODONE HCL 50 MG PO TABS: 50.0000 mg | ORAL_TABLET | Freq: Every evening | ORAL | 0 refills | Status: DC | PRN

## 2019-03-24 MED ORDER — TRAMADOL HCL 50 MG PO TABS: 50.0000 mg | ORAL_TABLET | Freq: Two times a day (BID) | ORAL | 0 refills | Status: DC | PRN

## 2019-03-24 MED ORDER — GABAPENTIN 100 MG PO CAPS: 100.0000 mg | ORAL_CAPSULE | Freq: Three times a day (TID) | ORAL | 0 refills | Status: DC

## 2019-03-24 MED ORDER — TRAMADOL HCL 50 MG PO TABS: 50.0000 mg | ORAL_TABLET | Freq: Two times a day (BID) | ORAL | 0 refills | Status: AC | PRN

## 2019-03-24 MED FILL — traMADol HCL 50 MG TABS: 50 | 2 days supply | Qty: 6 | Fill #0

## 2019-03-24 MED FILL — traZODone HCL 50 MG TABS: 50 | 30 days supply | Qty: 30 | Fill #0

## 2019-03-24 MED FILL — GABAPENTIN 100 MG CAPSULE: 100 | 30 days supply | Qty: 90 | Fill #0
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
## 2019-03-24 NOTE — Plan of Care (Signed)
  Problem: Education: Goal: Knowledge of General Education information will improve Description Including pain rating scale, medication(s)/side effects and non-pharmacologic comfort measures Outcome: Adequate for Discharge   Problem: Health Behavior/Discharge Planning: Goal: Ability to manage health-related needs will improve Outcome: Adequate for Discharge   

## 2019-03-24 NOTE — TOC Transition Note (Signed)
Transition of Care [facility] - CM/SW Discharge Note   Patient Details  Name: Lauren Warner MRN: [id number] Date of Birth: 10/16/1965  Transition of [facility]) CM/SW Contact:  Jazlene Bares, Francetta Found, LCSW Phone Number: 03/24/2019, 3:22 PM   Clinical Narrative:     [place] Supervisor met with patient at bedside to finalize discharge plans.  Patient is agreeable with [facility] placement and planned discharge for today.  ACS communicated with [facility] RN [name] to solidify plans.  Patient has been accepted and can arrive anytime around 17:00.  CSW to provide patient and RN with cab voucher to arrange transport.  ACS spoke with patient at length regarding ability to obtain food - plan in place.  ACS spoke with Renal Navigator who has made arrangements for transportation for patient dialysis and has secured a center with a schedule.  Patient plans to notify family and friends of discharge.  ACS did confirm for patient that all requested documentation was sent to [facility].  Copy of [facility] contract provided to patient for the hotel.  No further needs identified.  ACS remains available for support as needed until discharge.   Final next level of care: Other (comment)([facility]) Barriers to Discharge: Barriers Resolved   Patient Goals and CMS Choice Patient states their goals for this hospitalization and ongoing recovery are:: To live in her own home independently CMS Medicare.gov Compare Post Acute Care list provided to:: Patient Choice offered to / list presented to : Patient  Discharge Placement                Patient to be transferred to facility by: Cab Voucher Name of family member notified: Patient to notify family Patient and family notified of of transfer: 03/24/19  Discharge Plan and Services                DME Arranged: 3-N-1, Tub bench DME Agency: [employer] Date DME Agency Contacted: 03/24/19 Time DME Agency Contacted: [id number] Representative spoke with at DME Agency:  [place]: PT [place]: [place] ([place]) Date [place]: 03/24/19   Representative spoke with at [place]: [place] ([place]) Interventions     Readmission Risk Interventions No flowsheet data found.    Barbette Or, [place]

## 2019-03-24 NOTE — Progress Notes (Signed)
Occupational Therapy Treatment Patient Details Name: Lauren Warner MRN: [id number] DOB: 1965-09-06 Today's Date: 03/24/2019    History of present illness Pt is a 53 y.o. female admitted 03/09/19 with chest pain in the seitting of missing HD treatments and noncompliance with medications. Pt reports having L AKA 2 months prior and has been at [facility] in [place] until now. Other PMH includes ESRD (HD MWF), HTN, DM, anemia, CAD, anxiety, depression, PE; recent COVID-19 infection.   OT comments  Pt progressing towards OT goals this session. Able to complete dressing EOB with lateral leans without LOB, min guard for transfer to WC. Pt very excited about pending discharge. Current POC remains appropriate.    Follow Up Recommendations  SNF;Home health OT;Supervision/Assistance - 24 hour    Equipment Recommendations  3 in 1 bedside commode;Tub/shower bench    Recommendations for Other Services      Precautions / Restrictions Precautions Precautions: Fall Precaution Comments: L AKA  Required Braces or Orthoses: Other Brace Other Brace: shrinker for L AKA       Mobility Bed Mobility Overal bed mobility: Modified Independent             General bed mobility comments: bed to wheelchair  Transfers Overall transfer level: Needs assistance Equipment used: Rolling walker (2 wheeled) Transfers: Sit to/from [employer] Sit to Stand: Min guard Stand pivot transfers: Min guard       General transfer comment: assist for balance and cues for safety    Balance Overall balance assessment: Needs assistance Sitting-balance support: No upper extremity supported;Feet supported Sitting balance-Leahy Scale: Good Sitting balance - Comments: able to perform lateral leans and weight shift for dressing tasks   Standing balance support: Bilateral upper extremity supported Standing balance-Leahy Scale: Poor Standing balance comment: UE support for static standing balance with L  AKA                           ADL either performed or assessed with clinical judgement   ADL Overall ADL's : Needs assistance/impaired                 Upper Body Dressing : Supervision/safety;Sitting Upper Body Dressing Details (indicate cue type and reason): able to don clothing EOB Lower Body Dressing: Min guard;Sitting/lateral leans Lower Body Dressing Details (indicate cue type and reason): to don underwear, pants, etc. Pt able to perform lateral leans                General ADL Comments: overall decreased activity tolerance, and required rest breaks during dressing task, Educated Pt on safety importance and having someone there for assist when she is up, Pt very excited about getting out of the hospital.     Vision       Perception     Praxis      Cognition Arousal/Alertness: Awake/alert Behavior During Therapy: WFL for tasks assessed/performed Overall Cognitive Status: Impaired/Different from baseline Area of Impairment: Safety/judgement;Awareness;Problem solving;Memory                         Safety/Judgement: Decreased awareness of safety;Decreased awareness of deficits Awareness: Emergent Problem Solving: Slow processing          Exercises     Shoulder Instructions       General Comments      Pertinent Vitals/ Pain       Pain Assessment: Faces Faces Pain Scale: No hurt Pain Intervention(s): Monitored  during session  Home Living                                          Prior Functioning/Environment              Frequency  Min 2X/week        Progress Toward Goals  OT Goals(current goals can now be found in the care plan section)  Progress towards OT goals: Progressing toward goals  Acute Rehab OT Goals Patient Stated Goal: to go home to my house  OT Goal Formulation: With patient Time For Goal Achievement: 03/29/19 Potential to Achieve Goals: Good  Plan Discharge plan remains  appropriate;Frequency remains appropriate    Co-evaluation                 AM-PAC OT "6 Clicks" Daily Activity     Outcome Measure   Help from another person eating meals?: None Help from another person taking care of personal grooming?: A Little Help from another person toileting, which includes using toliet, bedpan, or urinal?: A Little Help from another person bathing (including washing, rinsing, drying)?: A Little Help from another person to put on and taking off regular upper body clothing?: None Help from another person to put on and taking off regular lower body clothing?: A Little 6 Click Score: 20    End of Session Equipment Utilized During Treatment: Gait belt;Rolling walker;Other (comment)(wheelchair)  OT Visit Diagnosis: Other abnormalities of gait and mobility (R26.89)   Activity Tolerance Patient tolerated treatment well   Patient Left Other (comment);with call bell/phone within reach(in wheelchair)   Nurse Communication Mobility status        Time: 1432-1450 OT Time Calculation (min): 18 min  Charges: OT General Charges $OT Visit: 1 Visit OT Treatments $Self Care/Home Management : 8-22 mins  Hulda Humphrey OTR/L Acute Rehabilitation Services Pager: [phone] Office: [place] 03/24/2019, 4:28 PM

## 2019-03-24 NOTE — Progress Notes (Signed)
Patient has been accepted at [facility] on a TTS schedule with a seat time of 12:50pm. She needs to arrive at 12:30pm for her appointments. She is scheduled for discharge today per CSW. Since she is changing HD schedules from MWF to TTS, Renal Navigator has discussed with Nephrologist/Dr. Jonnie Finner, who states she can have HD Monday, Wednesday, Saturday this week to change her schedule.  Renal Navigator met with patient at bedside to discuss and inform her of her new HD clinic and schedule. Patient very appreciative and pleased. She has SCAT already active. Renal Navigator arranged [facility] for tomorrow to take her to the clinic to sign consent papers and for her first 3 treatments, 10/24, 10/27, and 10/29. Patient cleared for discharge from an OP HD/Nephrology standpoint. Renal Navigator notified CSW who will arrange transport to motel.  Alphonzo Cruise, [place] Renal Navigator [phone]

## 2019-03-24 NOTE — Progress Notes (Signed)
PT Cancellation Note  Patient Details Name: Lauren Warner MRN: [id number] DOB: Jul 22, 1965   Cancelled Treatment:    Reason Eval/Treat Not Completed: Other (comment); sitting in w/c dressed with bags packed.  Reviewed safety information on transfers, not walking without assistance and plan for follow up HHPT, tub bench and toilet riser.  She verbalized understanding of all.  Will defer tx as planned d/c today.    Reginia Naas 03/24/2019, 3:26 PM  Magda Kiel, [place] [phone] 03/24/2019

## 2019-03-24 NOTE — Care Management Important Message (Signed)
Important Message  Patient Details  Name: Lauren Warner MRN: [id number] Date of Birth: 1965/10/03   Medicare Important Message Given:  Yes     Shelda Altes 03/24/2019, 1:29 PM
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
## 2019-03-25 ENCOUNTER — Telehealth: Payer: Self-pay | Admitting: Nephrology

## 2019-03-25 NOTE — Telephone Encounter (Signed)
Transition of care contact from inpatient facility  Date of discharge: 03/23/2019 Date of contact: 03/24/2019 Method of contact: phone Talked to patient  Patient contacted to discuss transition of care from recent inpatient hospitalization.  Patient was admitted to [facility] from 10/7-10/22/2020 due to homelessness and the need to find an OP dialysis center.  Medication changes reviewed. Patient will follow up with his outpatient dialysis center on 03/26/2019 Other follow up needs include: none.   Jen Mow, PA-C [facility] Pager: [phone]

## 2019-03-27 ENCOUNTER — Telehealth: Payer: Self-pay | Admitting: Surgery

## 2019-03-27 ENCOUNTER — Telehealth: Payer: Self-pay

## 2019-03-27 NOTE — Telephone Encounter (Signed)
ED CM will also notify [facility] CM concerning placing  patient on will call list for possible sooner appointment.

## 2019-03-27 NOTE — Telephone Encounter (Signed)
03/27/19 1620 W. Stann Mainland RN BSN [facility]  ED CM received call from [facility] from [facility] concerning patient not having discharge medications.  Patient was discharged from [facility] services, but when [facility] arrived today they realized patient did not have medications.  [place] Nurse Attempted to get prescription called in but was unable to find someone who was familiar with patient to do so.  As per [facility] nurse patient have been off meds since discharge and will return to the ED for evaluation.    Laurena Slimmer RN, BSN  Transitional Care Department ED Care Manager [phone]

## 2019-03-27 NOTE — Telephone Encounter (Addendum)
Call received from [place] liaison.  [facility] staff reports patient has no medications except Neurontin, Ultram, Desyrel.  Patient has no insulin, HTN meds, or psych meds.  No scripts were written for these meds.    Dr. Nicole Kindred, hospitalist, contacted to call in medications to [facility] [address] per [facility] request.  [facility] physical therapist will pick up scripts for patient.  [facility] will contact [facility] liaison to find out if copays are paid by program.    Addendum: 1600  Dr. Arbutus Ped is unable to write new prescriptions due to lack of contact with the patient since d/c.  She advises patient should return to the ED or urgent care to get appropriate prescriptions written.  The [facility] contact has been unavailable per [facility] staff.  There is a question of how copays for scripts will be paid.    Valerie at [facility] advised and will relay to physical therapist.  Mateo Flow states they are not able to see patient until pt has appropriate medications in the home.  They will instruct patient to call 911 for ambulance transport to ED since the patient has no transportation. Patient has refused thus far to go to the ED.    Patient went to HD yesterday and scheduled to go again on Tuesday. ED CM and [facility] supervisor aware of patient needs.

## 2019-03-28 NOTE — Progress Notes (Signed)
03/27/19: TOC supervisor on call notified by Darrelyn Hillock of situation.  Supervisor left message for Lessie Dings of [employer], [phone] and received return call shortly after.  Debbie trying to contact the RN assigned but was unable to on 10/25.  Debbie to contact [facility] supervisor Monday with update.  03/28/19: 0900 TOC supervisor received call from [employer].  She had contacted the RN assigned to pt, who had been to the home twice.  Pt has scheduled a PCP appt to get scripts for needed medications. TOC supervisor also requested they contact [facility] so that they are aware once resolved and can initiated services. Winferd Humphrey, MSW, LCSW [facility] Supervisor 03/28/2019 10:28 AM

## 2019-03-31 ENCOUNTER — Telehealth: Payer: Self-pay

## 2019-04-01 ENCOUNTER — Telehealth: Payer: Self-pay

## 2019-04-05 ENCOUNTER — Encounter (HOSPITAL_COMMUNITY): Payer: Self-pay | Admitting: Emergency Medicine

## 2019-04-05 ENCOUNTER — Inpatient Hospital Stay (HOSPITAL_COMMUNITY)
Admission: EM | Admit: 2019-04-05 | Discharge: 2019-04-08 | DRG: 391 | Disposition: A | Discharge status: Home / Self Care | Payer: Medicare Other | Source: Ambulatory Visit | Attending: Internal Medicine | Admitting: Internal Medicine

## 2019-04-05 ENCOUNTER — Emergency Department (HOSPITAL_COMMUNITY): Payer: Medicare Other

## 2019-04-05 ENCOUNTER — Other Ambulatory Visit: Payer: Self-pay

## 2019-04-05 DIAGNOSIS — Z823 Family history of stroke: Secondary | ICD-10-CM

## 2019-04-05 DIAGNOSIS — E11649 Type 2 diabetes mellitus with hypoglycemia without coma: Secondary | ICD-10-CM | POA: Diagnosis not present

## 2019-04-05 DIAGNOSIS — R52 Pain, unspecified: Secondary | ICD-10-CM

## 2019-04-05 DIAGNOSIS — R203 Hyperesthesia: Secondary | ICD-10-CM | POA: Diagnosis present

## 2019-04-05 DIAGNOSIS — E7800 Pure hypercholesterolemia, unspecified: Secondary | ICD-10-CM | POA: Diagnosis present

## 2019-04-05 DIAGNOSIS — Z87442 Personal history of urinary calculi: Secondary | ICD-10-CM

## 2019-04-05 DIAGNOSIS — E1129 Type 2 diabetes mellitus with other diabetic kidney complication: Secondary | ICD-10-CM | POA: Diagnosis present

## 2019-04-05 DIAGNOSIS — I120 Hypertensive chronic kidney disease with stage 5 chronic kidney disease or end stage renal disease: Secondary | ICD-10-CM | POA: Diagnosis present

## 2019-04-05 DIAGNOSIS — R109 Unspecified abdominal pain: Secondary | ICD-10-CM | POA: Diagnosis not present

## 2019-04-05 DIAGNOSIS — K209 Esophagitis, unspecified without bleeding: Secondary | ICD-10-CM | POA: Diagnosis present

## 2019-04-05 DIAGNOSIS — Z885 Allergy status to narcotic agent status: Secondary | ICD-10-CM

## 2019-04-05 DIAGNOSIS — I10 Essential (primary) hypertension: Secondary | ICD-10-CM | POA: Diagnosis present

## 2019-04-05 DIAGNOSIS — E1121 Type 2 diabetes mellitus with diabetic nephropathy: Secondary | ICD-10-CM

## 2019-04-05 DIAGNOSIS — M5489 Other dorsalgia: Secondary | ICD-10-CM | POA: Diagnosis present

## 2019-04-05 DIAGNOSIS — K8020 Calculus of gallbladder without cholecystitis without obstruction: Secondary | ICD-10-CM | POA: Diagnosis present

## 2019-04-05 DIAGNOSIS — Z992 Dependence on renal dialysis: Secondary | ICD-10-CM

## 2019-04-05 DIAGNOSIS — E1122 Type 2 diabetes mellitus with diabetic chronic kidney disease: Secondary | ICD-10-CM | POA: Diagnosis present

## 2019-04-05 DIAGNOSIS — Z794 Long term (current) use of insulin: Secondary | ICD-10-CM

## 2019-04-05 DIAGNOSIS — Z86711 Personal history of pulmonary embolism: Secondary | ICD-10-CM

## 2019-04-05 DIAGNOSIS — E875 Hyperkalemia: Secondary | ICD-10-CM | POA: Diagnosis present

## 2019-04-05 DIAGNOSIS — E1165 Type 2 diabetes mellitus with hyperglycemia: Secondary | ICD-10-CM | POA: Diagnosis present

## 2019-04-05 DIAGNOSIS — E8889 Other specified metabolic disorders: Secondary | ICD-10-CM | POA: Diagnosis present

## 2019-04-05 DIAGNOSIS — Z79899 Other long term (current) drug therapy: Secondary | ICD-10-CM

## 2019-04-05 DIAGNOSIS — R748 Abnormal levels of other serum enzymes: Secondary | ICD-10-CM | POA: Diagnosis present

## 2019-04-05 DIAGNOSIS — N186 End stage renal disease: Secondary | ICD-10-CM | POA: Diagnosis present

## 2019-04-05 DIAGNOSIS — Z8679 Personal history of other diseases of the circulatory system: Secondary | ICD-10-CM

## 2019-04-05 DIAGNOSIS — Z89512 Acquired absence of left leg below knee: Secondary | ICD-10-CM

## 2019-04-05 DIAGNOSIS — R451 Restlessness and agitation: Secondary | ICD-10-CM | POA: Diagnosis present

## 2019-04-05 DIAGNOSIS — Z7982 Long term (current) use of aspirin: Secondary | ICD-10-CM

## 2019-04-05 DIAGNOSIS — D631 Anemia in chronic kidney disease: Secondary | ICD-10-CM | POA: Diagnosis present

## 2019-04-05 DIAGNOSIS — F329 Major depressive disorder, single episode, unspecified: Secondary | ICD-10-CM | POA: Diagnosis present

## 2019-04-05 DIAGNOSIS — Z955 Presence of coronary angioplasty implant and graft: Secondary | ICD-10-CM

## 2019-04-05 DIAGNOSIS — I2510 Atherosclerotic heart disease of native coronary artery without angina pectoris: Secondary | ICD-10-CM | POA: Diagnosis present

## 2019-04-05 DIAGNOSIS — Z9115 Patient's noncompliance with renal dialysis: Secondary | ICD-10-CM

## 2019-04-05 DIAGNOSIS — R197 Diarrhea, unspecified: Secondary | ICD-10-CM | POA: Diagnosis present

## 2019-04-05 DIAGNOSIS — J45909 Unspecified asthma, uncomplicated: Secondary | ICD-10-CM | POA: Diagnosis present

## 2019-04-05 DIAGNOSIS — R112 Nausea with vomiting, unspecified: Secondary | ICD-10-CM | POA: Diagnosis present

## 2019-04-05 DIAGNOSIS — Z9981 Dependence on supplemental oxygen: Secondary | ICD-10-CM

## 2019-04-05 DIAGNOSIS — I150 Renovascular hypertension: Secondary | ICD-10-CM

## 2019-04-05 DIAGNOSIS — N2581 Secondary hyperparathyroidism of renal origin: Secondary | ICD-10-CM | POA: Diagnosis present

## 2019-04-05 DIAGNOSIS — Z20828 Contact with and (suspected) exposure to other viral communicable diseases: Secondary | ICD-10-CM | POA: Diagnosis present

## 2019-04-05 DIAGNOSIS — Z7902 Long term (current) use of antithrombotics/antiplatelets: Secondary | ICD-10-CM

## 2019-04-05 LAB — BASIC METABOLIC PANEL
Anion gap: 21 — ABNORMAL HIGH (ref 5–15)
BUN: 101 mg/dL — ABNORMAL HIGH (ref 6–20)
CO2: 23 mmol/L (ref 22–32)
Calcium: 7.9 mg/dL — ABNORMAL LOW (ref 8.9–10.3)
Chloride: 96 mmol/L — ABNORMAL LOW (ref 98–111)
Creatinine, Ser: 8.91 mg/dL — ABNORMAL HIGH (ref 0.44–1.00)
GFR calc Af Amer: 5 mL/min — ABNORMAL LOW (ref >60)
GFR calc non Af Amer: 5 mL/min — ABNORMAL LOW (ref >60)
Glucose, Bld: 245 mg/dL — ABNORMAL HIGH (ref 70–99)
Potassium: 4.4 mmol/L (ref 3.5–5.1)
Sodium: 140 mmol/L (ref 135–145)

## 2019-04-05 LAB — CBC
HCT: 33.4 % — ABNORMAL LOW (ref 36.0–46.0)
Hemoglobin: 10.4 g/dL — ABNORMAL LOW (ref 12.0–15.0)
MCH: 23.5 pg — ABNORMAL LOW (ref 26.0–34.0)
MCHC: 31.1 g/dL (ref 30.0–36.0)
MCV: 75.4 fL — ABNORMAL LOW (ref 80.0–100.0)
Platelets: 213 10*3/uL (ref 150–400)
RBC: 4.43 MIL/uL (ref 3.87–5.11)
RDW: 17 % — ABNORMAL HIGH (ref 11.5–15.5)
WBC: 7.5 10*3/uL (ref 4.0–10.5)
nRBC: 0 % (ref 0.0–0.2)

## 2019-04-05 LAB — CBG MONITORING, ED: Glucose-Capillary: 275 mg/dL — ABNORMAL HIGH (ref 70–99)

## 2019-04-05 NOTE — ED Notes (Signed)
Security called out on the radio stating the pt fell out in the floor. This RN found pt leaning over chair in lobby, [place], [place] and this RN helped pt to a wheel chair. Pt crying. No injuries reported by pt, vitals rechecked.

## 2019-04-05 NOTE — ED Notes (Signed)
This NT approached the pt due to what appeared and sounded as if she was vomiting. Pt was spitting on the floor. This NT asked the pt to please stop spitting and an emesis bag was provided to her. Pt began to cough and This NT asked her to please put her mask on. Pt at first would not comply. This NT once again asked her to please put her mask back on and pt did so. Pt kept stating that she does not feel good. This NT reassessed her vitals and Charge Nurse has been notified.

## 2019-04-05 NOTE — ED Triage Notes (Signed)
Per [facility] pt coming from her dialysis treatment, received about half of her treatment. Staff report patient started to become agitated and had difficulty taking vitals and sitting still. Patient c/o left flank pain onset of this am. Hx of kidney stones.

## 2019-04-06 ENCOUNTER — Encounter (HOSPITAL_COMMUNITY): Payer: Self-pay | Admitting: Radiology

## 2019-04-06 ENCOUNTER — Inpatient Hospital Stay (HOSPITAL_COMMUNITY): Payer: Medicare Other

## 2019-04-06 ENCOUNTER — Emergency Department (HOSPITAL_COMMUNITY): Payer: Medicare Other

## 2019-04-06 DIAGNOSIS — E11649 Type 2 diabetes mellitus with hypoglycemia without coma: Secondary | ICD-10-CM | POA: Diagnosis not present

## 2019-04-06 DIAGNOSIS — E875 Hyperkalemia: Secondary | ICD-10-CM | POA: Diagnosis present

## 2019-04-06 DIAGNOSIS — I10 Essential (primary) hypertension: Secondary | ICD-10-CM | POA: Diagnosis not present

## 2019-04-06 DIAGNOSIS — E1121 Type 2 diabetes mellitus with diabetic nephropathy: Secondary | ICD-10-CM | POA: Diagnosis not present

## 2019-04-06 DIAGNOSIS — E7800 Pure hypercholesterolemia, unspecified: Secondary | ICD-10-CM | POA: Diagnosis present

## 2019-04-06 DIAGNOSIS — R748 Abnormal levels of other serum enzymes: Secondary | ICD-10-CM | POA: Diagnosis present

## 2019-04-06 DIAGNOSIS — R109 Unspecified abdominal pain: Secondary | ICD-10-CM | POA: Diagnosis present

## 2019-04-06 DIAGNOSIS — M5489 Other dorsalgia: Secondary | ICD-10-CM | POA: Diagnosis present

## 2019-04-06 DIAGNOSIS — N186 End stage renal disease: Secondary | ICD-10-CM | POA: Diagnosis present

## 2019-04-06 DIAGNOSIS — R203 Hyperesthesia: Secondary | ICD-10-CM | POA: Diagnosis present

## 2019-04-06 DIAGNOSIS — E1122 Type 2 diabetes mellitus with diabetic chronic kidney disease: Secondary | ICD-10-CM | POA: Diagnosis present

## 2019-04-06 DIAGNOSIS — N2581 Secondary hyperparathyroidism of renal origin: Secondary | ICD-10-CM | POA: Diagnosis present

## 2019-04-06 DIAGNOSIS — K8020 Calculus of gallbladder without cholecystitis without obstruction: Secondary | ICD-10-CM | POA: Diagnosis present

## 2019-04-06 DIAGNOSIS — Z20828 Contact with and (suspected) exposure to other viral communicable diseases: Secondary | ICD-10-CM | POA: Diagnosis present

## 2019-04-06 DIAGNOSIS — E8889 Other specified metabolic disorders: Secondary | ICD-10-CM | POA: Diagnosis present

## 2019-04-06 DIAGNOSIS — Z992 Dependence on renal dialysis: Secondary | ICD-10-CM

## 2019-04-06 DIAGNOSIS — R197 Diarrhea, unspecified: Secondary | ICD-10-CM

## 2019-04-06 DIAGNOSIS — R1012 Left upper quadrant pain: Secondary | ICD-10-CM

## 2019-04-06 DIAGNOSIS — I120 Hypertensive chronic kidney disease with stage 5 chronic kidney disease or end stage renal disease: Secondary | ICD-10-CM | POA: Diagnosis present

## 2019-04-06 DIAGNOSIS — F329 Major depressive disorder, single episode, unspecified: Secondary | ICD-10-CM | POA: Diagnosis present

## 2019-04-06 DIAGNOSIS — R112 Nausea with vomiting, unspecified: Secondary | ICD-10-CM | POA: Diagnosis present

## 2019-04-06 DIAGNOSIS — R451 Restlessness and agitation: Secondary | ICD-10-CM | POA: Diagnosis present

## 2019-04-06 DIAGNOSIS — D631 Anemia in chronic kidney disease: Secondary | ICD-10-CM | POA: Diagnosis present

## 2019-04-06 DIAGNOSIS — I2510 Atherosclerotic heart disease of native coronary artery without angina pectoris: Secondary | ICD-10-CM | POA: Diagnosis present

## 2019-04-06 DIAGNOSIS — E1165 Type 2 diabetes mellitus with hyperglycemia: Secondary | ICD-10-CM | POA: Diagnosis present

## 2019-04-06 DIAGNOSIS — Z9115 Patient's noncompliance with renal dialysis: Secondary | ICD-10-CM | POA: Diagnosis not present

## 2019-04-06 DIAGNOSIS — J45909 Unspecified asthma, uncomplicated: Secondary | ICD-10-CM | POA: Diagnosis present

## 2019-04-06 LAB — HEPATIC FUNCTION PANEL
ALT: 18 U/L (ref 0–44)
AST: 12 U/L — ABNORMAL LOW (ref 15–41)
Albumin: 3.5 g/dL (ref 3.5–5.0)
Alkaline Phosphatase: 96 U/L (ref 38–126)
Bilirubin, Direct: 0.1 mg/dL (ref 0.0–0.2)
Indirect Bilirubin: 0.5 mg/dL (ref 0.3–0.9)
Total Bilirubin: 0.6 mg/dL (ref 0.3–1.2)
Total Protein: 7.3 g/dL (ref 6.5–8.1)

## 2019-04-06 LAB — CBC
HCT: 32.7 % — ABNORMAL LOW (ref 36.0–46.0)
Hemoglobin: 9.8 g/dL — ABNORMAL LOW (ref 12.0–15.0)
MCH: 23.3 pg — ABNORMAL LOW (ref 26.0–34.0)
MCHC: 30 g/dL (ref 30.0–36.0)
MCV: 77.7 fL — ABNORMAL LOW (ref 80.0–100.0)
Platelets: 192 10*3/uL (ref 150–400)
RBC: 4.21 MIL/uL (ref 3.87–5.11)
RDW: 17.3 % — ABNORMAL HIGH (ref 11.5–15.5)
WBC: 6.6 10*3/uL (ref 4.0–10.5)
nRBC: 0 % (ref 0.0–0.2)

## 2019-04-06 LAB — COMPREHENSIVE METABOLIC PANEL
ALT: 14 U/L (ref 0–44)
AST: 10 U/L — ABNORMAL LOW (ref 15–41)
Albumin: 3.3 g/dL — ABNORMAL LOW (ref 3.5–5.0)
Alkaline Phosphatase: 81 U/L (ref 38–126)
Anion gap: 25 — ABNORMAL HIGH (ref 5–15)
BUN: 128 mg/dL — ABNORMAL HIGH (ref 6–20)
CO2: 19 mmol/L — ABNORMAL LOW (ref 22–32)
Calcium: 8 mg/dL — ABNORMAL LOW (ref 8.9–10.3)
Chloride: 96 mmol/L — ABNORMAL LOW (ref 98–111)
Creatinine, Ser: 10.71 mg/dL — ABNORMAL HIGH (ref 0.44–1.00)
GFR calc Af Amer: 4 mL/min — ABNORMAL LOW (ref >60)
GFR calc non Af Amer: 4 mL/min — ABNORMAL LOW (ref >60)
Glucose, Bld: 295 mg/dL — ABNORMAL HIGH (ref 70–99)
Potassium: 5.7 mmol/L — ABNORMAL HIGH (ref 3.5–5.1)
Sodium: 140 mmol/L (ref 135–145)
Total Bilirubin: 0.8 mg/dL (ref 0.3–1.2)
Total Protein: 6.6 g/dL (ref 6.5–8.1)

## 2019-04-06 LAB — CBG MONITORING, ED
Glucose-Capillary: 238 mg/dL — ABNORMAL HIGH (ref 70–99)
Glucose-Capillary: 316 mg/dL — ABNORMAL HIGH (ref 70–99)

## 2019-04-06 LAB — CK: Total CK: 124 U/L (ref 38–234)

## 2019-04-06 LAB — HEMOGLOBIN A1C
Hgb A1c MFr Bld: 9.6 % — ABNORMAL HIGH (ref 4.8–5.6)
Mean Plasma Glucose: 228.82 mg/dL

## 2019-04-06 LAB — LACTIC ACID, PLASMA: Lactic Acid, Venous: 1 mmol/L (ref 0.5–1.9)

## 2019-04-06 LAB — LIPASE, BLOOD
Lipase: 101 U/L — ABNORMAL HIGH (ref 11–51)
Lipase: 71 U/L — ABNORMAL HIGH (ref 11–51)

## 2019-04-06 LAB — AMYLASE: Amylase: 177 U/L — ABNORMAL HIGH (ref 28–100)

## 2019-04-06 LAB — SARS CORONAVIRUS 2 (TAT 6-24 HRS): SARS Coronavirus 2: NEGATIVE

## 2019-04-06 MED ORDER — QUETIAPINE FUMARATE 25 MG PO TABS
100.0000 mg | ORAL_TABLET | Freq: Every day | ORAL | Status: DC
  Administered 2019-04-06 – 2019-04-07 (×2): 100 mg via ORAL
  Filled 2019-04-06: qty 4
  Filled 2019-04-06: qty 1
  Filled 2019-04-06: qty 2

## 2019-04-06 MED ORDER — TRAZODONE HCL 50 MG PO TABS
50.0000 mg | ORAL_TABLET | Freq: Every evening | ORAL | Status: DC | PRN
  Administered 2019-04-06: 50 mg via ORAL
  Filled 2019-04-06: qty 1

## 2019-04-06 MED ORDER — PROMETHAZINE HCL 25 MG/ML IJ SOLN
6.2500 mg | Freq: Once | INTRAMUSCULAR | Status: AC
  Administered 2019-04-06: 21:00:00 6.25 mg via INTRAVENOUS
  Filled 2019-04-06 (×2): qty 1

## 2019-04-06 MED ORDER — SODIUM ZIRCONIUM CYCLOSILICATE 10 G PO PACK
10.0000 g | PACK | Freq: Every day | ORAL | Status: DC
  Administered 2019-04-07 – 2019-04-08 (×2): 10 g via ORAL
  Filled 2019-04-06 (×3): qty 1

## 2019-04-06 MED ORDER — INSULIN DETEMIR 100 UNIT/ML ~~LOC~~ SOLN
8.0000 [IU] | Freq: Every day | SUBCUTANEOUS | Status: DC
  Administered 2019-04-06 – 2019-04-07 (×2): 8 [IU] via SUBCUTANEOUS
  Filled 2019-04-06 (×3): qty 0.08

## 2019-04-06 MED ORDER — CLOPIDOGREL BISULFATE 75 MG PO TABS
75.0000 mg | ORAL_TABLET | Freq: Every day | ORAL | Status: DC
  Administered 2019-04-06 – 2019-04-08 (×3): 75 mg via ORAL
  Filled 2019-04-06 (×3): qty 1

## 2019-04-06 MED ORDER — ATOMOXETINE HCL 40 MG PO CAPS
40.0000 mg | ORAL_CAPSULE | Freq: Every day | ORAL | Status: DC
  Administered 2019-04-07 – 2019-04-08 (×2): 40 mg via ORAL
  Filled 2019-04-06 (×2): qty 1

## 2019-04-06 MED ORDER — GABAPENTIN 100 MG PO CAPS
100.0000 mg | ORAL_CAPSULE | Freq: Three times a day (TID) | ORAL | Status: DC
  Administered 2019-04-06 – 2019-04-08 (×7): 100 mg via ORAL
  Filled 2019-04-06 (×8): qty 1

## 2019-04-06 MED ORDER — HEPARIN SODIUM (PORCINE) 5000 UNIT/ML IJ SOLN
5000.0000 [IU] | Freq: Three times a day (TID) | INTRAMUSCULAR | Status: DC
  Administered 2019-04-06 – 2019-04-08 (×7): 5000 [IU] via SUBCUTANEOUS
  Filled 2019-04-06 (×8): qty 1

## 2019-04-06 MED ORDER — LORAZEPAM 1 MG PO TABS
1.0000 mg | ORAL_TABLET | Freq: Four times a day (QID) | ORAL | Status: DC | PRN
  Administered 2019-04-06: 23:00:00 1 mg via ORAL
  Filled 2019-04-06: qty 1

## 2019-04-06 MED ORDER — CHLORHEXIDINE GLUCONATE CLOTH 2 % EX PADS
6.0000 | MEDICATED_PAD | Freq: Every day | CUTANEOUS | Status: DC
  Administered 2019-04-07 – 2019-04-08 (×2): 6 via TOPICAL

## 2019-04-06 MED ORDER — HYDRALAZINE HCL 20 MG/ML IJ SOLN
10.0000 mg | Freq: Four times a day (QID) | INTRAMUSCULAR | Status: DC | PRN
  Administered 2019-04-06 (×2): 10 mg via INTRAVENOUS
  Filled 2019-04-06 (×2): qty 1

## 2019-04-06 MED ORDER — INSULIN ASPART 100 UNIT/ML ~~LOC~~ SOLN
0.0000 [IU] | Freq: Three times a day (TID) | SUBCUTANEOUS | Status: DC
  Administered 2019-04-06: 14:00:00 7 [IU] via SUBCUTANEOUS
  Administered 2019-04-08: 09:00:00 2 [IU] via SUBCUTANEOUS

## 2019-04-06 MED ORDER — IOHEXOL 300 MG/ML  SOLN
100.0000 mL | Freq: Once | INTRAMUSCULAR | Status: AC | PRN
  Administered 2019-04-06: 05:00:00 100 mL via INTRAVENOUS

## 2019-04-06 MED ORDER — ONDANSETRON HCL 4 MG/2ML IJ SOLN
4.0000 mg | Freq: Four times a day (QID) | INTRAMUSCULAR | Status: DC | PRN
  Administered 2019-04-06: 09:00:00 4 mg via INTRAVENOUS
  Filled 2019-04-06: qty 2

## 2019-04-06 MED ORDER — HYDROMORPHONE HCL 1 MG/ML IJ SOLN
0.5000 mg | Freq: Once | INTRAMUSCULAR | Status: DC
  Filled 2019-04-06: qty 1

## 2019-04-06 MED ORDER — HYDROXYZINE HCL 25 MG PO TABS
25.0000 mg | ORAL_TABLET | Freq: Three times a day (TID) | ORAL | Status: DC | PRN
  Administered 2019-04-06: 09:00:00 25 mg via ORAL
  Filled 2019-04-06: qty 1

## 2019-04-06 MED ORDER — SEVELAMER CARBONATE 800 MG PO TABS
2400.0000 mg | ORAL_TABLET | Freq: Three times a day (TID) | ORAL | Status: DC
  Administered 2019-04-07 – 2019-04-08 (×3): 2400 mg via ORAL
  Filled 2019-04-06 (×5): qty 3

## 2019-04-06 MED ORDER — CALCIUM ACETATE (PHOS BINDER) 667 MG PO CAPS: 667.0000 mg | ORAL_CAPSULE | Freq: Three times a day (TID) | ORAL | Status: DC

## 2019-04-06 MED ORDER — ESCITALOPRAM OXALATE 10 MG PO TABS
10.0000 mg | ORAL_TABLET | Freq: Every day | ORAL | Status: DC
  Administered 2019-04-06 – 2019-04-08 (×3): 10 mg via ORAL
  Filled 2019-04-06 (×3): qty 1

## 2019-04-06 MED ORDER — LATANOPROST 0.005 % OP SOLN
1.0000 [drp] | Freq: Every day | OPHTHALMIC | Status: DC
  Administered 2019-04-06 – 2019-04-07 (×2): 1 [drp] via OPHTHALMIC
  Filled 2019-04-06: qty 2.5

## 2019-04-06 MED ORDER — ALBUTEROL SULFATE HFA 108 (90 BASE) MCG/ACT IN AERS
2.0000 | INHALATION_SPRAY | Freq: Four times a day (QID) | RESPIRATORY_TRACT | Status: DC | PRN
  Filled 2019-04-06: qty 6.7

## 2019-04-06 MED ORDER — HYDROMORPHONE HCL 1 MG/ML IJ SOLN
2.0000 mg | Freq: Once | INTRAMUSCULAR | Status: AC
  Administered 2019-04-06: 2 mg via INTRAMUSCULAR
  Filled 2019-04-06: qty 2

## 2019-04-06 MED ORDER — LORAZEPAM 2 MG/ML IJ SOLN
0.5000 mg | Freq: Once | INTRAMUSCULAR | Status: AC
  Administered 2019-04-06: 10:00:00 0.5 mg via INTRAVENOUS
  Filled 2019-04-06: qty 1

## 2019-04-06 MED ORDER — IPRATROPIUM BROMIDE 0.02 % IN SOLN
0.5000 mg | Freq: Four times a day (QID) | RESPIRATORY_TRACT | Status: DC
  Administered 2019-04-06: 0.5 mg via RESPIRATORY_TRACT
  Filled 2019-04-06: qty 2.5

## 2019-04-06 MED ORDER — FENTANYL CITRATE (PF) 100 MCG/2ML IJ SOLN
50.0000 ug | Freq: Once | INTRAMUSCULAR | Status: AC
  Administered 2019-04-06: 02:00:00 50 ug via INTRAVENOUS
  Filled 2019-04-06: qty 2

## 2019-04-06 MED ORDER — INSULIN ASPART 100 UNIT/ML ~~LOC~~ SOLN: 0.0000 [IU] | Freq: Every day | SUBCUTANEOUS | Status: DC

## 2019-04-06 MED ORDER — CALCITRIOL 0.25 MCG PO CAPS
0.2500 ug | ORAL_CAPSULE | ORAL | Status: DC
  Administered 2019-04-07: 0.25 ug via ORAL
  Filled 2019-04-06 (×2): qty 1

## 2019-04-06 MED ORDER — HYDRALAZINE HCL 20 MG/ML IJ SOLN
10.0000 mg | Freq: Once | INTRAMUSCULAR | Status: AC
  Administered 2019-04-06: 02:00:00 10 mg via INTRAVENOUS
  Filled 2019-04-06: qty 1

## 2019-04-06 MED ORDER — LIDOCAINE 5 % EX PTCH
1.0000 | MEDICATED_PATCH | CUTANEOUS | Status: DC
  Administered 2019-04-06 – 2019-04-08 (×3): 1 via TRANSDERMAL
  Filled 2019-04-06 (×3): qty 1

## 2019-04-06 MED ORDER — ONDANSETRON HCL 4 MG PO TABS: 4.0000 mg | ORAL_TABLET | Freq: Four times a day (QID) | ORAL | Status: DC | PRN

## 2019-04-06 MED ORDER — HYDROMORPHONE HCL 1 MG/ML IJ SOLN
1.0000 mg | INTRAMUSCULAR | Status: DC | PRN
  Filled 2019-04-06: qty 1

## 2019-04-06 MED ORDER — ONDANSETRON HCL 4 MG/2ML IJ SOLN
4.0000 mg | Freq: Once | INTRAMUSCULAR | Status: AC
  Administered 2019-04-06: 4 mg via INTRAVENOUS
  Filled 2019-04-06: qty 2

## 2019-04-06 MED ORDER — CLONIDINE HCL 0.2 MG PO TABS
0.1000 mg | ORAL_TABLET | Freq: Once | ORAL | Status: AC
  Administered 2019-04-06: 23:00:00 0.1 mg via ORAL
  Filled 2019-04-06: qty 1

## 2019-04-06 MED ORDER — RENA-VITE PO TABS
1.0000 | ORAL_TABLET | Freq: Every day | ORAL | Status: DC
  Administered 2019-04-07: 1 via ORAL
  Filled 2019-04-06 (×2): qty 1

## 2019-04-06 MED ORDER — HYDROMORPHONE HCL 1 MG/ML IJ SOLN
1.0000 mg | Freq: Once | INTRAMUSCULAR | Status: AC
  Administered 2019-04-06: 03:00:00 1 mg via INTRAVENOUS
  Filled 2019-04-06: qty 1

## 2019-04-06 MED ORDER — SENNOSIDES-DOCUSATE SODIUM 8.6-50 MG PO TABS: 1.0000 | ORAL_TABLET | Freq: Every evening | ORAL | Status: DC | PRN

## 2019-04-06 MED ORDER — HYDROMORPHONE HCL 1 MG/ML IJ SOLN
1.0000 mg | INTRAMUSCULAR | Status: DC | PRN
  Administered 2019-04-06 (×4): 1 mg via INTRAVENOUS
  Filled 2019-04-06 (×3): qty 1

## 2019-04-06 NOTE — H&P (Addendum)
History and Physical        Hospital Admission Note Date: 04/06/2019  Patient name: Lauren Warner Medical record number: [id number] Date of birth: [birth year] Age: 53 y.o. Gender: female  PCP: [name], Gerty    Patient coming from: Lives in hotel  I have reviewed all records in the [facility].    Chief Complaint:  Nausea vomiting, pain (flank, back), for last 2 days  HPI: Patient is a 53 year old female with history of ESRD on hemodialysisTTS, hypertension, diabetes mellitus, anemia, CAD status post PCI, currently living in a hotel.  Patient presented via EMS, from her dialysis treatment, states that she received half of her treatment and stopped it due to pain.  Staff had reported that patient became agitated and had difficulty taking her vitals, sitting still.  She reported of left flank pain since yesterday, has a history of kidney stones.  She also reported nausea and vomiting, 2 episodes of vomiting, no hematemesis.  No diarrhea, fevers or chills.  Last BM on 11/3.  No dyspnea or chest pain. At the time of my examination, patient appeared to be uncomfortable.  She reported pain all over her back thoracic and lumbar area, hyperesthesia and tenderness on touching even with stethoscope, 10/10, constant and sharp.  No dermatomal fashion.  ED work-up/course:  In ED, BP 199/70, pulse 51, respiratory rate 16, at the time of triage BP was elevated 210/97 Temp 98.6 Sodium 140, potassium 4.4, BUN 101, creatinine 8.91, lipase elevated 101 Lactic acid 1.0, WBC 7.5, hemoglobin 10.4, platelets 213 COVID-19 test pending CT renal stone study showed no evidence of renal or ureteral stones, no hydronephrosis, cholelithiasis, no acute findings in the abdomen or pelvis  CT abdomen and pelvis was repeated with contrast which showed no acute intra-abdominal or intrapelvic  process.  Multiple small cystic lesions in the pancreatic body some of which are stable from prior study in 2019, others appear new.  Could be evaluated with MRI of the abdomen with and without contrast on nonemergent basis, cholelithiasis without acute inflammation.  Wall thickening of distal esophagus, correlate for symptoms of esophagitis   Review of Systems: Positives marked in 'bold' Constitutional: Denies fever, chills, diaphoresis, +poor appetite and fatigue.  HEENT: Denies photophobia, eye pain, redness, hearing loss, ear pain, congestion, sore throat, rhinorrhea, sneezing, mouth sores, trouble swallowing, neck pain, neck stiffness and tinnitus.   Respiratory: Denies SOB, DOE, cough, chest tightness,  and wheezing.   Cardiovascular: Denies chest pain, palpitations and leg swelling.  Gastrointestinal: Please see HPI Genitourinary: Denies dysuria, urgency, frequency, hematuria, flank pain and difficulty urinating.  Musculoskeletal: Denies myalgias, back pain, joint swelling, arthralgias and gait problem.  Skin: Denies pallor, rash and wound.  Neurological: Denies dizziness, seizures, syncope, weakness, light-headedness, numbness and headaches.  Hematological: Denies adenopathy. Easy bruising, personal or family bleeding history  Psychiatric/Behavioral: Denies suicidal ideation, mood changes, confusion, nervousness, sleep disturbance and agitation  Past Medical History: Past Medical History:  Diagnosis Date   Anemia    Anxiety    Asthma    CHF (congestive heart failure) (HCC)    Coronary artery disease    Daily headache    Depression  ESRD (end stage renal disease) on dialysis ([place])    "[facility]; TTS" (10/19/2017)   High cholesterol    History of blood transfusion 10/19/2017   "anemia"   Hyperkalemia 10/2017   Hypertension    On home oxygen therapy    "2L; daily" (10/19/2017)   Pneumonia    "several times" (10/19/2017)   Pulmonary embolism ([place]) 2012   Type  1 diabetes mellitus ([place])     Past Surgical History:  Procedure Laterality Date   AV FISTULA PLACEMENT Left 2018   CESAREAN SECTION  1989   CORONARY ANGIOPLASTY WITH STENT PLACEMENT  ~ 08/2017   "3 stents" (10/19/2017)   ENDOMETRIAL ABLATION  ~ 2011    Medications: Prior to Admission medications   Medication Sig Start Date End Date Taking? Authorizing Provider  albuterol (PROAIR HFA) 108 (90 Base) MCG/ACT inhaler Inhale 2 puffs into the lungs every 6 (six) hours as needed for wheezing or shortness of breath. 12/04/17   Roxan Hockey, MD  aspirin EC 81 MG tablet Take 1 tablet (81 mg total) by mouth daily. With food 12/04/17   Roxan Hockey, MD  atomoxetine (STRATTERA) 40 MG capsule Take 40 mg by mouth daily.    [provider]  calcium acetate (PHOSLO) 667 MG capsule Take 1 capsule (667 mg total) by mouth 3 (three) times daily with meals. 12/04/17   Roxan Hockey, MD  clopidogrel (PLAVIX) 75 MG tablet Take 1 tablet (75 mg total) by mouth daily. 12/04/17   Roxan Hockey, MD  escitalopram (LEXAPRO) 10 MG tablet Take 1 tablet (10 mg total) by mouth daily. 01/25/18   Bonnielee Haff, MD  gabapentin (NEURONTIN) 100 MG capsule Take 1 capsule (100 mg total) by mouth 3 (three) times daily. 03/24/19   Geradine Girt, DO  hydrOXYzine (ATARAX/VISTARIL) 25 MG tablet Take 1 tablet (25 mg total) by mouth 3 (three) times daily as needed for anxiety. 03/08/18   Pucilowska, Jolanta B, MD  insulin lispro (HUMALOG) 100 UNIT/ML KiwkPen Inject 0.03 mLs (3 Units total) into the skin 3 (three) times daily. 01/25/18   Bonnielee Haff, MD  latanoprost (XALATAN) 0.005 % ophthalmic solution Place 1 drop into both eyes at bedtime. 12/04/17   Roxan Hockey, MD  lidocaine-prilocaine (EMLA) cream Apply 1 application topically as needed. 1 hour before dialysis 02/11/18   [provider]  multivitamin (RENA-VIT) TABS tablet Take 1 tablet by mouth at bedtime. 12/04/17   Roxan Hockey, MD  QUEtiapine  (SEROQUEL) 100 MG tablet Take 1 tablet (100 mg total) by mouth at bedtime. 03/08/18   Pucilowska, Herma Ard B, MD  sevelamer carbonate (RENVELA) 800 MG tablet Take 3 tablets (2,400 mg total) by mouth 3 (three) times daily with meals. 01/10/18   Robbie Lis, MD  traZODone (DESYREL) 50 MG tablet Take 1 tablet (50 mg total) by mouth at bedtime as needed for sleep. 03/24/19   Geradine Girt, DO    Allergies:   Allergies  Allergen Reactions   Adhesive [Tape] Other (See Comments)    "RIPS MY SKIN," SO PLEASE USE COBAN WRAP!!   Hydrocodone-Acetaminophen Other (See Comments)    "Makes me feel crazy"   Metformin And Related Other (See Comments)    "Makes me feel crazy"    Social History:  reports that she has never smoked. She has never used smokeless tobacco. She reports that she does not drink alcohol or use drugs.  Family History: Family History  Problem Relation Age of Onset   Stroke Sister  Physical Exam: Blood pressure (!) 183/107, pulse 88, temperature 98.2 F (36.8 C), temperature source Oral, resp. rate 16, SpO2 99 %. General: Alert, awake, oriented x3, uncomfortable.  No vomiting during encounter Eyes: pink conjunctiva,anicteric sclera, pupils equal and reactive to light and accomodation, HEENT: normocephalic, atraumatic, oropharynx clear Neck: supple, no masses or lymphadenopathy, no goiter, no bruits, no JVD CVS: Regular rate and rhythm, without murmurs, rubs or gallops. No lower extremity edema Resp : Clear to auscultation bilaterally, no wheezing, rales or rhonchi. GI : Soft, nondistended, positive bowel sounds, no masses. No hepatomegaly. No hernia.  Hyperesthesia and tenderness, left flank, thoracic and lumbar spine area, even on auscultating lungs, patient was complaining of pain/tenderness Musculoskeletal: Left AKA, no edema right lower extremity Neuro: Grossly intact, no focal neurological deficits, strength 5/5 upper and lower extremities bilaterally Psych:  alert and oriented x 3, anxious Skin: no rashes or lesions, warm and dry   LABS on Admission: I have personally reviewed all the labs and imagings below    Basic Metabolic Panel: Recent Labs  Lab 04/05/19 1509  NA 140  K 4.4  CL 96*  CO2 23  GLUCOSE 245*  BUN 101*  CREATININE 8.91*  CALCIUM 7.9*   Liver Function Tests: Recent Labs  Lab 04/05/19 1509  AST 12*  ALT 18  ALKPHOS 96  BILITOT 0.6  PROT 7.3  ALBUMIN 3.5   Recent Labs  Lab 04/05/19 1509  LIPASE 101*   No results for input(s): AMMONIA in the last 168 hours. CBC: Recent Labs  Lab 04/05/19 1509  WBC 7.5  HGB 10.4*  HCT 33.4*  MCV 75.4*  PLT 213   Cardiac Enzymes: No results for input(s): CKTOTAL, CKMB, CKMBINDEX, TROPONINI in the last 168 hours. BNP: Invalid input(s): POCBNP CBG: Recent Labs  Lab 04/05/19 2011  GLUCAP 275*    Radiological Exams on Admission:  Ct Abdomen Pelvis W Contrast  Result Date: 04/06/2019 CLINICAL DATA:  Abdominal pain. Left-sided abdominal pain EXAM: CT ABDOMEN AND PELVIS WITH CONTRAST TECHNIQUE: Multidetector CT imaging of the abdomen and pelvis was performed using the standard protocol following bolus administration of intravenous contrast. CONTRAST:  132mL OMNIPAQUE IOHEXOL 300 MG/ML  SOLN COMPARISON:  April 05, 2019 FINDINGS: Lower chest: The lung bases are clear. The heart size is normal. Hepatobiliary: The liver is normal. Cholelithiasis without acute inflammation.There is no biliary ductal dilation. Pancreas: There are multiple small cystic lesions in the pancreatic body, some of which are stable from prior study in 2019, while others appear new. Spleen: No splenic laceration or hematoma. Adrenals/Urinary Tract: --Adrenal glands: No adrenal hemorrhage. --Right kidney/ureter: No hydronephrosis or perinephric hematoma. --Left kidney/ureter: No hydronephrosis or perinephric hematoma. --Urinary bladder: Unremarkable. Stomach/Bowel: --Stomach/Duodenum: There is wall  thickening of the distal esophagus. --Small bowel: No dilatation or inflammation. --Colon: No focal abnormality. --Appendix: Not visualized. No right lower quadrant inflammation or free fluid. Vascular/Lymphatic: Atherosclerotic calcification is present within the non-aneurysmal abdominal aorta, without hemodynamically significant stenosis. There is an IVC filter in place --there mild enlarged retroperitoneal lymph nodes. --No mesenteric lymphadenopathy. --there is few prominent pelvic lymph nodes. Reproductive: There is a fibroid uterus. Other: No ascites or free air. The abdominal wall is normal. Musculoskeletal. No acute displaced fractures. IMPRESSION: 1. No acute intra-abdominal or intrapelvic process. 2. Multiple small cystic lesions in the pancreatic body, some of which are stable from prior study in 2019, while others appear new. These could be further evaluated with MRI of the abdomen with and without contrast on  a nonemergent basis. 3. Cholelithiasis without acute inflammation. 4. Aortic atherosclerosis. 5. There is wall thickening of the distal esophagus. Correlate for symptoms of esophagitis. 6. Fibroid uterus. Aortic Atherosclerosis (ICD10-I70.0). Electronically Signed   By: Constance Holster M.D.   On: 04/06/2019 05:05   Ct Renal Stone Study  Result Date: 04/05/2019 CLINICAL DATA:  Left side abdominal pain, nausea, vomiting, left flank pain EXAM: CT ABDOMEN AND PELVIS WITHOUT CONTRAST TECHNIQUE: Multidetector CT imaging of the abdomen and pelvis was performed following the standard protocol without IV contrast. COMPARISON:  12/23/2017 FINDINGS: Lower chest: Cardiomegaly. Dense calcifications in the visualized right coronary artery. No acute abnormality. Hepatobiliary: Layering gallstones within the gallbladder. No focal hepatic abnormality. Pancreas: No focal abnormality or ductal dilatation. Spleen: No focal abnormality.  Normal size. Adrenals/Urinary Tract: No adrenal abnormality. No focal renal  abnormality. No stones or hydronephrosis. Urinary bladder is unremarkable. Stomach/Bowel: Stomach, large and small bowel grossly unremarkable. Vascular/Lymphatic: Aortic and branch vessel atherosclerosis. No aneurysm or adenopathy. IVC filter noted. Reproductive: Calcifications in the uterus compatible with fibroids. No adnexal mass. Other: No free fluid or free air. Musculoskeletal: No acute bony abnormality. IMPRESSION: No evidence of renal or ureteral stones.  No hydronephrosis. Coronary artery disease. Aortic atherosclerosis and branch vessel atherosclerosis. Cholelithiasis. No acute findings in the abdomen or pelvis. Electronically Signed   By: Rolm Baptise M.D.   On: 04/05/2019 22:04      EKG: Independently reviewed.  None new available  Assessment/Plan Principal Problem:   Nausea vomiting, left flank and back pain  -Unclear etiology, patient had CT renal stone study as well as a CT abdomen pelvis with contrast with no acute findings.  Lipase is mildly elevated however no pancreatic inflammation seen on the CT abdomen.  No obstructing nephrolithiasis or acute cholecystitis.  Patient complaining of pain, hyperesthesia on palpating.  No shingles noticed or any dermatomal fashion - will recheck amylase, lipase, CK (had a fall from the chair prior to admission) -Currently no acute vomiting, placed on clear liquid diet -Given patient is on hemodialysis, nephrology also consulted. -Continue pain control, antiemetics, monitor closely for pain medication seeking behavior    Active Problems: Accelerated hypertension -Patient did not complete hemodialysis yesterday, has history of noncompliance.  BP 210/97 on admission -will restart lisinopril, placed on hydralazine IV as needed with parameters    ESRD on dialysis [facility]) -Patient is on hemodialysis TTS, nephrology consulted, discussed with Dr. Arty Baumgartner    DM (diabetes mellitus), type 2 with renal complications ([place]) -Obtain hemoglobin A1c,  placed on sliding scale insulin, sensitive    Left below-knee amputee ([place]) -Stable  Esophagitis per CT -Placed on Protonix daily, currently no chest pain  DVT prophylaxis: Heparin subcu  CODE STATUS: Full CODE STATUS  Consults called: Nephrology, Dr.`Colodonato  Family Communication: Admission, patients condition and plan of care including tests being ordered have been discussed with the patient who indicates understanding and agree with the plan and Code Status  Admission status: Observation   The medical decision making on this patient was of high complexity and the patient is at high risk for clinical deterioration, therefore this is a level 3 admission.  Severity of Illness:      The appropriate patient status for this patient is OBSERVATION. Observation status is judged to be reasonable and necessary in order to provide the required intensity of service to ensure the patient's safety. The patient's presenting symptoms, physical exam findings, and initial radiographic and laboratory data in the context of their medical  condition is felt to place them at decreased risk for further clinical deterioration. Furthermore, it is anticipated that the patient will be medically stable for discharge from the hospital within 2 midnights of admission. The following factors support the patient status of observation.   " The patient's presenting symptoms include left flank pain, back pain " The physical exam findings include left flank pain, back pain, tenderness and hyperesthesia. " The initial radiographic and laboratory data are mildly elevated lipase, cholelithiasis on CT, no cholecystitis     Time Spent on Admission: 17mins      Finley Dinkel M.D. [facility] 04/06/2019, 8:19 AM

## 2019-04-06 NOTE — Progress Notes (Signed)
Inpatient Diabetes Program Recommendations  AACE/ADA: New Consensus Statement on Inpatient Glycemic Control (2015)  Target Ranges:  Prepandial:   less than 140 mg/dL      Peak postprandial:   less than 180 mg/dL (1-2 hours)      Critically ill patients:  140 - 180 mg/dL   Lab Results  Component Value Date   GLUCAP 316 (H) 04/06/2019   HGBA1C 9.6 (H) 04/06/2019    Review of Glycemic Control Results for Lauren Warner, Lauren Warner (MRN [id number]) as of 04/06/2019 14:49  Ref. Range 04/05/2019 20:11 04/06/2019 14:13  Glucose-Capillary Latest Ref Range: 70 - 99 mg/dL 275 (H) 316 (H)   Diabetes history: Type 2 DM Outpatient Diabetes medications: Humalog 3 units TID Current orders for Inpatient glycemic control: Novolog 0-9 units TID, Novolog 0-5 units QHS  Inpatient Diabetes Program Recommendations:    Consider also adding Levemir 8 units QHS.   Thanks, Bronson Curb, MSN, RNC-OB Diabetes Coordinator [phone] (8a-5p)

## 2019-04-06 NOTE — ED Notes (Signed)
Clear liquid lunch tray ordered 

## 2019-04-06 NOTE — Consult Note (Addendum)
[facility] Renal Consultation Note    Indication for Consultation:  Management of ESRD/hemodialysis, anemia, hypertension/volume, and secondary hyperparathyroidism. PCP:  HPI: Lauren Warner is a 53 y.o. female with a PMH including ESRD on dialysis, HTN, CHF, CAD, and recent admission 03/09/19-03/24/19 for hypertensive urgency/CHF from missed dialysis. Patient presented to the ED last night with flank pain, nausea, vomiting and diarrhea. Patient has a history of kidney stones and felt pain was similar. In the ED, CT renal study was negative for stones. However, patient continued to have severe 10/10 back pain radiating to the abdomen. Study was repeated with contrast and showed multiple small cystic lesions in the pancreatic body, cholelithiasis without acute inflammation, and wall thickening of the distal esophagus.  Blood pressure was significantly elevated and IV hydralazine and lisinopril were ordered. Patient did miss dialysis on 04/02/2019 and completed 1:40hr of her treatment on 04/05/2019, but terminated treatment due to pain.   At time of my exam, patient initially resting in bed then began yelling in pain and unable to sit still. Pain appears to come in waves and is associated with tachycardia. Patient denies SOB, dyspnea, CP, palpitations, and dizziness. She does endorse severe back pain to very light touch, radiating to the abdomen. HPI/ROS and exam were somewhat limited due to patient condition.   Na 140, K+ 5.7, BUN 128, Cr 10.71, CO2 19, Amylase 177, lipase 71, WBC 6,6, Hgb 9.8.  Past Medical History:  Diagnosis Date  . Anemia   . Anxiety   . Asthma   . CHF (congestive heart failure) ([place])   . Coronary artery disease   . Daily headache   . Depression   . ESRD (end stage renal disease) on dialysis ([place])    "[facility]; TTS" (10/19/2017)  . High cholesterol   . History of blood transfusion 10/19/2017   "anemia"  . Hyperkalemia 10/2017  . Hypertension   . On home  oxygen therapy    "2L; daily" (10/19/2017)  . Pneumonia    "several times" (10/19/2017)  . Pulmonary embolism ([place]) 2012  . Type 1 diabetes mellitus ([place])    Past Surgical History:  Procedure Laterality Date  . AV FISTULA PLACEMENT Left 2018  . CESAREAN SECTION  1989  . CORONARY ANGIOPLASTY WITH STENT PLACEMENT  ~ 08/2017   "3 stents" (10/19/2017)  . ENDOMETRIAL ABLATION  ~ 2011   Family History  Problem Relation Age of Onset  . Stroke Sister    Social History:  reports that she has never smoked. She has never used smokeless tobacco. She reports that she does not drink alcohol or use drugs.  ROS: As per HPI otherwise negative.   Physical Exam: Vitals:   04/06/19 0544 04/06/19 0800 04/06/19 0900 04/06/19 1036  BP: (!) 183/107 (!) 184/90 (!) 196/95 (!) 212/94  Pulse: 88  80   Resp: 16     Temp:      TempSrc:      SpO2: 99%  94%      General: Well developed, female, appears very uncomfortable, intermittently yelling in pain Head: Normocephalic, atraumatic, sclera non-icteric, mucus membranes are moist. Neck: JVD not elevated. Lungs: Limited cooperation with exam. Clear bilaterally to auscultation without wheezes, rales, or rhonchi. Breathing is unlabored. Heart: RRR with normal S1, S2. No murmurs, rubs, or gallops appreciated. Abdomen: Pt refused abdominal exam. No vomiting during encounter. Musculoskeletal:  L AKA, no lower extremity edema Lower extremities: No edema or ischemic changes, no open wounds. Neuro: Alert. Moves all extremities spontaneously.  Psych: Answers some questions appropriately, poor cooperation due to pain Dialysis Access: LUE AVF  Allergies  Allergen Reactions  . Adhesive [Tape] Other (See Comments)    "RIPS MY SKIN," SO PLEASE USE COBAN WRAP!!  . Hydrocodone-Acetaminophen Other (See Comments)    "Makes me feel crazy"  . Metformin And Related Other (See Comments)    "Makes me feel crazy"   Prior to Admission medications   Medication Sig Start  Date End Date Taking? Authorizing Provider  albuterol (PROAIR HFA) 108 (90 Base) MCG/ACT inhaler Inhale 2 puffs into the lungs every 6 (six) hours as needed for wheezing or shortness of breath. 12/04/17   Roxan Hockey, MD  aspirin EC 81 MG tablet Take 1 tablet (81 mg total) by mouth daily. With food 12/04/17   Roxan Hockey, MD  atomoxetine (STRATTERA) 40 MG capsule Take 40 mg by mouth daily.    [provider]  calcium acetate (PHOSLO) 667 MG capsule Take 1 capsule (667 mg total) by mouth 3 (three) times daily with meals. 12/04/17   Roxan Hockey, MD  clopidogrel (PLAVIX) 75 MG tablet Take 1 tablet (75 mg total) by mouth daily. 12/04/17   Roxan Hockey, MD  escitalopram (LEXAPRO) 10 MG tablet Take 1 tablet (10 mg total) by mouth daily. 01/25/18   Bonnielee Haff, MD  gabapentin (NEURONTIN) 100 MG capsule Take 1 capsule (100 mg total) by mouth 3 (three) times daily. 03/24/19   Geradine Girt, DO  hydrOXYzine (ATARAX/VISTARIL) 25 MG tablet Take 1 tablet (25 mg total) by mouth 3 (three) times daily as needed for anxiety. 03/08/18   Pucilowska, Jolanta B, MD  insulin lispro (HUMALOG) 100 UNIT/ML KiwkPen Inject 0.03 mLs (3 Units total) into the skin 3 (three) times daily. 01/25/18   Bonnielee Haff, MD  latanoprost (XALATAN) 0.005 % ophthalmic solution Place 1 drop into both eyes at bedtime. 12/04/17   Roxan Hockey, MD  lidocaine-prilocaine (EMLA) cream Apply 1 application topically as needed. 1 hour before dialysis 02/11/18   [provider]  multivitamin (RENA-VIT) TABS tablet Take 1 tablet by mouth at bedtime. 12/04/17   Roxan Hockey, MD  QUEtiapine (SEROQUEL) 100 MG tablet Take 1 tablet (100 mg total) by mouth at bedtime. 03/08/18   Pucilowska, Herma Ard B, MD  sevelamer carbonate (RENVELA) 800 MG tablet Take 3 tablets (2,400 mg total) by mouth 3 (three) times daily with meals. 01/10/18   Robbie Lis, MD  traZODone (DESYREL) 50 MG tablet Take 1 tablet (50 mg total) by mouth at  bedtime as needed for sleep. 03/24/19   Geradine Girt, DO   Current Facility-Administered Medications  Medication Dose Route Frequency Provider Last Rate Last Dose  . albuterol (VENTOLIN HFA) 108 (90 Base) MCG/ACT inhaler 2 puff  2 puff Inhalation Q6H PRN Rai, Ripudeep K, MD      . atomoxetine (STRATTERA) capsule 40 mg  40 mg Oral Daily Rai, Ripudeep K, MD      . calcium acetate (PHOSLO) capsule 667 mg  667 mg Oral TID WC Rai, Ripudeep K, MD      . clopidogrel (PLAVIX) tablet 75 mg  75 mg Oral Daily Rai, Ripudeep K, MD   75 mg at 04/06/19 0958  . escitalopram (LEXAPRO) tablet 10 mg  10 mg Oral Daily Rai, Ripudeep K, MD   10 mg at 04/06/19 0958  . gabapentin (NEURONTIN) capsule 100 mg  100 mg Oral TID Rai, Ripudeep K, MD   100 mg at 04/06/19 0959  . heparin injection  5,000 Units  5,000 Units Subcutaneous Q8H Rai, Ripudeep K, MD   5,000 Units at 04/06/19 1423  . hydrALAZINE (APRESOLINE) injection 10 mg  10 mg Intravenous Q6H PRN Rai, Ripudeep K, MD   10 mg at 04/06/19 1036  . HYDROmorphone (DILAUDID) injection 0.5 mg  0.5 mg Intravenous Once Mendel Corning, MD   Stopped at 04/06/19 [phone]  . HYDROmorphone (DILAUDID) injection 1 mg  1 mg Intravenous Q4H PRN Rai, Ripudeep K, MD      . HYDROmorphone (DILAUDID) injection 1 mg  1 mg Intravenous Q4H PRN Rai, Ripudeep K, MD   1 mg at 04/06/19 1222  . insulin aspart (novoLOG) injection 0-5 Units  0-5 Units Subcutaneous QHS Rai, Ripudeep K, MD      . insulin aspart (novoLOG) injection 0-9 Units  0-9 Units Subcutaneous TID WC Rai, Ripudeep K, MD   7 Units at 04/06/19 1424  . ipratropium (ATROVENT) nebulizer solution 0.5 mg  0.5 mg Nebulization Q6H Rai, Ripudeep K, MD   0.5 mg at 04/06/19 1429  . latanoprost (XALATAN) 0.005 % ophthalmic solution 1 drop  1 drop Both Eyes QHS Rai, Ripudeep K, MD      . LORazepam (ATIVAN) tablet 1 mg  1 mg Oral Q6H PRN Rai, Ripudeep K, MD      . multivitamin (RENA-VIT) tablet 1 tablet  1 tablet Oral QHS Rai, Ripudeep K, MD       . ondansetron (ZOFRAN) tablet 4 mg  4 mg Oral Q6H PRN Rai, Ripudeep K, MD       Or  . ondansetron (ZOFRAN) injection 4 mg  4 mg Intravenous Q6H PRN Rai, Ripudeep K, MD   4 mg at 04/06/19 [id number]  . promethazine (PHENERGAN) injection 6.25 mg  6.25 mg Intravenous Once Mendel Corning, MD   Stopped at 04/06/19 [phone]  . QUEtiapine (SEROQUEL) tablet 100 mg  100 mg Oral QHS Rai, Ripudeep K, MD      . senna-docusate (Senokot-S) tablet 1 tablet  1 tablet Oral QHS PRN Rai, Ripudeep K, MD      . sevelamer carbonate (RENVELA) tablet 2,400 mg  2,400 mg Oral TID WC Rai, Ripudeep K, MD      . sodium zirconium cyclosilicate (LOKELMA) packet 10 g  10 g Oral Daily Collins, Samantha G, PA-C      . traZODone (DESYREL) tablet 50 mg  50 mg Oral QHS PRN Rai, Vernelle Emerald, MD       Current Outpatient Medications  Medication Sig Dispense Refill  . albuterol (PROAIR HFA) 108 (90 Base) MCG/ACT inhaler Inhale 2 puffs into the lungs every 6 (six) hours as needed for wheezing or shortness of breath. 1 Inhaler 1  . aspirin EC 81 MG tablet Take 1 tablet (81 mg total) by mouth daily. With food 30 tablet 3  . atomoxetine (STRATTERA) 40 MG capsule Take 40 mg by mouth daily.    . calcium acetate (PHOSLO) 667 MG capsule Take 1 capsule (667 mg total) by mouth 3 (three) times daily with meals. 90 capsule 3  . clopidogrel (PLAVIX) 75 MG tablet Take 1 tablet (75 mg total) by mouth daily. 30 tablet 3  . escitalopram (LEXAPRO) 10 MG tablet Take 1 tablet (10 mg total) by mouth daily. 30 tablet 0  . gabapentin (NEURONTIN) 100 MG capsule Take 1 capsule (100 mg total) by mouth 3 (three) times daily. 90 capsule 0  . hydrOXYzine (ATARAX/VISTARIL) 25 MG tablet Take 1 tablet (25 mg total) by mouth 3 (three)  times daily as needed for anxiety. 90 tablet 1  . insulin lispro (HUMALOG) 100 UNIT/ML KiwkPen Inject 0.03 mLs (3 Units total) into the skin 3 (three) times daily. 15 mL 0  . latanoprost (XALATAN) 0.005 % ophthalmic solution Place 1 drop into  both eyes at bedtime. 2.5 mL 12  . lidocaine-prilocaine (EMLA) cream Apply 1 application topically as needed. 1 hour before dialysis  12  . multivitamin (RENA-VIT) TABS tablet Take 1 tablet by mouth at bedtime. 30 tablet 2  . QUEtiapine (SEROQUEL) 100 MG tablet Take 1 tablet (100 mg total) by mouth at bedtime. 30 tablet 1  . sevelamer carbonate (RENVELA) 800 MG tablet Take 3 tablets (2,400 mg total) by mouth 3 (three) times daily with meals. 270 tablet 0  . traZODone (DESYREL) 50 MG tablet Take 1 tablet (50 mg total) by mouth at bedtime as needed for sleep. 30 tablet 0   Labs: Basic Metabolic Panel: Recent Labs  Lab 04/05/19 1509 04/06/19 1107  NA 140 140  K 4.4 5.7*  CL 96* 96*  CO2 23 19*  GLUCOSE 245* 295*  BUN 101* 128*  CREATININE 8.91* 10.71*  CALCIUM 7.9* 8.0*   Liver Function Tests: Recent Labs  Lab 04/05/19 1509 04/06/19 1107  AST 12* 10*  ALT 18 14  ALKPHOS 96 81  BILITOT 0.6 0.8  PROT 7.3 6.6  ALBUMIN 3.5 3.3*   Recent Labs  Lab 04/05/19 1509 04/06/19 1107  LIPASE 101* 71*  AMYLASE  --  177*   No results for input(s): AMMONIA in the last 168 hours. CBC: Recent Labs  Lab 04/05/19 1509 04/06/19 1107  WBC 7.5 6.6  HGB 10.4* 9.8*  HCT 33.4* 32.7*  MCV 75.4* 77.7*  PLT 213 192   Cardiac Enzymes: Recent Labs  Lab 04/06/19 1107  CKTOTAL 124   CBG: Recent Labs  Lab 04/05/19 2011 04/06/19 1413  GLUCAP 275* 316*   Iron Studies: No results for input(s): IRON, TIBC, TRANSFERRIN, FERRITIN in the last 72 hours. Studies/Results: Ct Abdomen Pelvis W Contrast  Result Date: 04/06/2019 CLINICAL DATA:  Abdominal pain. Left-sided abdominal pain EXAM: CT ABDOMEN AND PELVIS WITH CONTRAST TECHNIQUE: Multidetector CT imaging of the abdomen and pelvis was performed using the standard protocol following bolus administration of intravenous contrast. CONTRAST:  151mL OMNIPAQUE IOHEXOL 300 MG/ML  SOLN COMPARISON:  April 05, 2019 FINDINGS: Lower chest: The lung  bases are clear. The heart size is normal. Hepatobiliary: The liver is normal. Cholelithiasis without acute inflammation.There is no biliary ductal dilation. Pancreas: There are multiple small cystic lesions in the pancreatic body, some of which are stable from prior study in 2019, while others appear new. Spleen: No splenic laceration or hematoma. Adrenals/Urinary Tract: --Adrenal glands: No adrenal hemorrhage. --Right kidney/ureter: No hydronephrosis or perinephric hematoma. --Left kidney/ureter: No hydronephrosis or perinephric hematoma. --Urinary bladder: Unremarkable. Stomach/Bowel: --Stomach/Duodenum: There is wall thickening of the distal esophagus. --Small bowel: No dilatation or inflammation. --Colon: No focal abnormality. --Appendix: Not visualized. No right lower quadrant inflammation or free fluid. Vascular/Lymphatic: Atherosclerotic calcification is present within the non-aneurysmal abdominal aorta, without hemodynamically significant stenosis. There is an IVC filter in place --there mild enlarged retroperitoneal lymph nodes. --No mesenteric lymphadenopathy. --there is few prominent pelvic lymph nodes. Reproductive: There is a fibroid uterus. Other: No ascites or free air. The abdominal wall is normal. Musculoskeletal. No acute displaced fractures. IMPRESSION: 1. No acute intra-abdominal or intrapelvic process. 2. Multiple small cystic lesions in the pancreatic body, some of which are stable from  prior study in 2019, while others appear new. These could be further evaluated with MRI of the abdomen with and without contrast on a nonemergent basis. 3. Cholelithiasis without acute inflammation. 4. Aortic atherosclerosis. 5. There is wall thickening of the distal esophagus. Correlate for symptoms of esophagitis. 6. Fibroid uterus. Aortic Atherosclerosis (ICD10-I70.0). Electronically Signed   By: Constance Holster M.D.   On: 04/06/2019 05:05   Ct Renal Stone Study  Result Date: 04/05/2019 CLINICAL DATA:   Left side abdominal pain, nausea, vomiting, left flank pain EXAM: CT ABDOMEN AND PELVIS WITHOUT CONTRAST TECHNIQUE: Multidetector CT imaging of the abdomen and pelvis was performed following the standard protocol without IV contrast. COMPARISON:  12/23/2017 FINDINGS: Lower chest: Cardiomegaly. Dense calcifications in the visualized right coronary artery. No acute abnormality. Hepatobiliary: Layering gallstones within the gallbladder. No focal hepatic abnormality. Pancreas: No focal abnormality or ductal dilatation. Spleen: No focal abnormality.  Normal size. Adrenals/Urinary Tract: No adrenal abnormality. No focal renal abnormality. No stones or hydronephrosis. Urinary bladder is unremarkable. Stomach/Bowel: Stomach, large and small bowel grossly unremarkable. Vascular/Lymphatic: Aortic and branch vessel atherosclerosis. No aneurysm or adenopathy. IVC filter noted. Reproductive: Calcifications in the uterus compatible with fibroids. No adnexal mass. Other: No free fluid or free air. Musculoskeletal: No acute bony abnormality. IMPRESSION: No evidence of renal or ureteral stones.  No hydronephrosis. Coronary artery disease. Aortic atherosclerosis and branch vessel atherosclerosis. Cholelithiasis. No acute findings in the abdomen or pelvis. Electronically Signed   By: Rolm Baptise M.D.   On: 04/05/2019 22:04    Dialysis Orders: Center: [facility] on TTS. Time: 4 hours, 180NRe, BFR 400, DFR 800. EDW 84kg, 2K, 2.0Ca, LUE AVF No heparin Calcitriol 0.83mcg PO TIW Renvela 800 mg 3-4 tabs TIW with meals  Assessment/Plan: 1.  Abdominal pain/Nausea: Patient appears to be significantly uncomfortable despite pain medications. CT renal stone study and CT abdomen pelvis without acute findings. Amylase elevated. Per primary.  2.  ESRD:  TTS, missed treatment 10/31 but did have an abbreviated treatment on 11/3. BUN is elevated today. Patient would likely not tolerate HD at this time due to severe  pain/inability to sit still and tachycardia. Repeat BMP and plan for HD in the morning.  3. Hyperkalemia: Ordered lokelma x1 dose. 2K bath with dialysis tomorrow.  4.  Hypertension/volume: Patient significantly hypertensive. BP meds discontinued at last admission. Started on lisinopril and hydralazine. Does not appear volume overloaded, however had not been reaching EDW as outpatient and BP appears to be persistently elevated. Will plan UF as tolerated with HD tomorrow, get daily weights to assess further once patient is stable.  5.  Anemia: Hemoglobin 9.8. Will start ESA if Hgb <10 in AM.  6.  Metabolic bone disease: Corrected calcium 8.6. Continue calcitriol and renvela 7.  Nutrition:  Currently on clear liquid diet.   Anice Paganini, PA-C 04/06/2019, 2:31 PM  [facility] Pager: [phone]  I have seen and examined this patient and agree with plan and assessment in the above note with renal recommendations/intervention highlighted.  Pt is yelling out and jumping out of her bed.  She is complaining of severe back pain in her mid back and yelling for a "shot" to make it go away.  Physical exam is unremarkable.  Consider CT angio to rule out dissection given her markedly elevated BP.  Will plan for HD tomorrow as her labs are stable and she had an extra treatment yesterday.  Concerning for pysch issues vs. Drug seeking as  well.  Pt with Psych history and poor social situation.  Governor Rooks Ismelda Weatherman,MD 04/06/2019 4:52 PM

## 2019-04-06 NOTE — ED Notes (Signed)
Pt's monitor breifly and intermittently read 150-200 with some leads off. Leads replaced and set up for a 12 lead should tachycardia reoccur, but pt's pulse was paplated about 120 when heart monitor read higher.

## 2019-04-06 NOTE — ED Notes (Signed)
Pt is sleeping at times and then wakes up yelling and screaming "Somebody help me" "Somebody help me". When asked what this RN could do for her, pt repeats "help me, help me". Pt does state that her pain is better.

## 2019-04-06 NOTE — ED Notes (Signed)
Pt rolling around in bed, will not keep gown or any cardiac leads on. Have replaced them multiple times. Pt will not leave b/p cuff in place either. Pt aware that she needs to, but refuses to listen.

## 2019-04-06 NOTE — ED Notes (Addendum)
Pt began vomiting. Emesis bag given. Pt c/o pain to left flank. Presently giving meds for nausea and pain, IV push very slowly.

## 2019-04-06 NOTE — ED Provider Notes (Signed)
[facility] EMERGENCY DEPARTMENT Provider Note   CSN: [id number] Arrival date & time: 04/05/19  1451     History   Chief Complaint Chief Complaint  Patient presents with  . Flank Pain    HPI Lauren Warner is a 53 y.o. female.     Patient to ED with left flank pain that started this morning and has been constant and progressive throughout the day. The pain radiates to the LLQ abdomen. She reports a history of kidney stones with similar pain. She has had nausea and vomiting. No diarrhea. Last bowel movement was this afternoon and did not affect the character of her pain. She receives dialysis (T, Th, S) and went for treatment today but had to stop with an hour remaining due to pain. No change in her breathing, no chest pain.   The history is provided by the patient. No language interpreter was used.  Flank Pain    Past Medical History:  Diagnosis Date  . Anemia   . Anxiety   . Asthma   . CHF (congestive heart failure) ([place])   . Coronary artery disease   . Daily headache   . Depression   . ESRD (end stage renal disease) on dialysis ([place])    "[facility]; TTS" (10/19/2017)  . High cholesterol   . History of blood transfusion 10/19/2017   "anemia"  . Hyperkalemia 10/2017  . Hypertension   . On home oxygen therapy    "2L; daily" (10/19/2017)  . Pneumonia    "several times" (10/19/2017)  . Pulmonary embolism ([place]) 2012  . Type 1 diabetes mellitus [facility])     Patient Active Problem List   Diagnosis Date Noted  . Left below-knee amputee ([place]) 03/24/2019  . Major depressive disorder, recurrent episode, moderate ([place]) 03/11/2019  . Acute on chronic diastolic (congestive) heart failure ([place]) 03/10/2019  . Encephalopathy, metabolic 123456  . Acute on chronic congestive heart failure ([place])   . Noncompliance with renal dialysis ([place])   . MDD (major depressive disorder), single episode, severe , no psychosis ([place])   . SOB (shortness of breath) 01/22/2018  .  Acute encephalopathy 01/07/2018  . Suicidal ideation 01/07/2018  . Hypertensive encephalopathy 12/23/2017  . Hypertensive urgency 12/22/2017  . Syncope and collapse 12/03/2017  . Hyperkalemia 11/10/2017  . Pressure injury of skin 10/20/2017  . Essential hypertension 10/19/2017  . CAD (coronary artery disease) 10/19/2017  . ESRD on dialysis ([place]) 10/19/2017  . Syncope 10/19/2017  . Anemia due to end stage renal disease ([place]) 10/19/2017  . Closed left ankle fracture 10/19/2017  . Nausea vomiting and diarrhea 10/19/2017  . DM (diabetes mellitus), type 2 with renal complications ([place]) 123456  . Acute respiratory failure with hypoxia ([place]) 10/18/2017    Past Surgical History:  Procedure Laterality Date  . AV FISTULA PLACEMENT Left 2018  . CESAREAN SECTION  1989  . CORONARY ANGIOPLASTY WITH STENT PLACEMENT  ~ 08/2017   "3 stents" (10/19/2017)  . ENDOMETRIAL ABLATION  ~ 2011     OB History   No obstetric history on file.      Home Medications    Prior to Admission medications   Medication Sig Start Date End Date Taking? Authorizing Provider  albuterol (PROAIR HFA) 108 (90 Base) MCG/ACT inhaler Inhale 2 puffs into the lungs every 6 (six) hours as needed for wheezing or shortness of breath. 12/04/17   Roxan Hockey, MD  aspirin EC 81 MG tablet Take 1 tablet (81 mg total) by mouth daily.  With food 12/04/17   Roxan Hockey, MD  atomoxetine (STRATTERA) 40 MG capsule Take 40 mg by mouth daily.    [provider]  calcium acetate (PHOSLO) 667 MG capsule Take 1 capsule (667 mg total) by mouth 3 (three) times daily with meals. 12/04/17   Roxan Hockey, MD  clopidogrel (PLAVIX) 75 MG tablet Take 1 tablet (75 mg total) by mouth daily. 12/04/17   Roxan Hockey, MD  escitalopram (LEXAPRO) 10 MG tablet Take 1 tablet (10 mg total) by mouth daily. 01/25/18   Bonnielee Haff, MD  gabapentin (NEURONTIN) 100 MG capsule Take 1 capsule (100 mg total) by mouth 3 (three) times daily.  03/24/19   Geradine Girt, DO  hydrOXYzine (ATARAX/VISTARIL) 25 MG tablet Take 1 tablet (25 mg total) by mouth 3 (three) times daily as needed for anxiety. 03/08/18   Pucilowska, Jolanta B, MD  insulin lispro (HUMALOG) 100 UNIT/ML KiwkPen Inject 0.03 mLs (3 Units total) into the skin 3 (three) times daily. 01/25/18   Bonnielee Haff, MD  latanoprost (XALATAN) 0.005 % ophthalmic solution Place 1 drop into both eyes at bedtime. 12/04/17   Roxan Hockey, MD  lidocaine-prilocaine (EMLA) cream Apply 1 application topically as needed. 1 hour before dialysis 02/11/18   [provider]  multivitamin (RENA-VIT) TABS tablet Take 1 tablet by mouth at bedtime. 12/04/17   Roxan Hockey, MD  QUEtiapine (SEROQUEL) 100 MG tablet Take 1 tablet (100 mg total) by mouth at bedtime. 03/08/18   Pucilowska, Herma Ard B, MD  sevelamer carbonate (RENVELA) 800 MG tablet Take 3 tablets (2,400 mg total) by mouth 3 (three) times daily with meals. 01/10/18   Robbie Lis, MD  traZODone (DESYREL) 50 MG tablet Take 1 tablet (50 mg total) by mouth at bedtime as needed for sleep. 03/24/19   Geradine Girt, DO    Family History Family History  Problem Relation Age of Onset  . Stroke Sister     Social History Social History   Tobacco Use  . Smoking status: Never Smoker  . Smokeless tobacco: Never Used  Substance Use Topics  . Alcohol use: Never    Frequency: Never  . Drug use: Never     Allergies   Adhesive [tape], Hydrocodone-acetaminophen, and Metformin and related   Review of Systems Review of Systems  Constitutional: Negative for chills and fever.  HENT: Negative.   Respiratory: Negative.   Cardiovascular: Negative.   Gastrointestinal: Negative.   Genitourinary: Positive for flank pain.  Skin: Negative.   Neurological: Negative.      Physical Exam Updated Vital Signs BP (!) 186/92 (BP Location: Right Arm)   Pulse (!) 103   Temp 98.2 F (36.8 C) (Oral)   Resp 18   SpO2 100%   Physical  Exam Constitutional:      Appearance: She is well-developed.  Neck:     Musculoskeletal: Normal range of motion.  Pulmonary:     Effort: Pulmonary effort is normal.  Abdominal:     Palpations: Abdomen is soft.     Tenderness: There is no abdominal tenderness.     Comments: Obese abdomen. No increased pain with LLQ palpation.  Skin:    General: Skin is warm and dry.  Neurological:     Mental Status: She is alert and oriented to person, place, and time.      ED Treatments / Results  Labs (all labs ordered are listed, but only abnormal results are displayed) Labs Reviewed  CBC - Abnormal; Notable for the following  components:      Result Value   Hemoglobin 10.4 (*)    HCT 33.4 (*)    MCV 75.4 (*)    MCH 23.5 (*)    RDW 17.0 (*)    All other components within normal limits  BASIC METABOLIC PANEL - Abnormal; Notable for the following components:   Chloride 96 (*)    Glucose, Bld 245 (*)    BUN 101 (*)    Creatinine, Ser 8.91 (*)    Calcium 7.9 (*)    GFR calc non Af Amer 5 (*)    GFR calc Af Amer 5 (*)    Anion gap 21 (*)    All other components within normal limits  CBG MONITORING, ED - Abnormal; Notable for the following components:   Glucose-Capillary 275 (*)    All other components within normal limits  URINALYSIS, ROUTINE W REFLEX MICROSCOPIC    EKG None  Radiology Ct Renal Stone Study  Result Date: 04/05/2019 CLINICAL DATA:  Left side abdominal pain, nausea, vomiting, left flank pain EXAM: CT ABDOMEN AND PELVIS WITHOUT CONTRAST TECHNIQUE: Multidetector CT imaging of the abdomen and pelvis was performed following the standard protocol without IV contrast. COMPARISON:  12/23/2017 FINDINGS: Lower chest: Cardiomegaly. Dense calcifications in the visualized right coronary artery. No acute abnormality. Hepatobiliary: Layering gallstones within the gallbladder. No focal hepatic abnormality. Pancreas: No focal abnormality or ductal dilatation. Spleen: No focal  abnormality.  Normal size. Adrenals/Urinary Tract: No adrenal abnormality. No focal renal abnormality. No stones or hydronephrosis. Urinary bladder is unremarkable. Stomach/Bowel: Stomach, large and small bowel grossly unremarkable. Vascular/Lymphatic: Aortic and branch vessel atherosclerosis. No aneurysm or adenopathy. IVC filter noted. Reproductive: Calcifications in the uterus compatible with fibroids. No adnexal mass. Other: No free fluid or free air. Musculoskeletal: No acute bony abnormality. IMPRESSION: No evidence of renal or ureteral stones.  No hydronephrosis. Coronary artery disease. Aortic atherosclerosis and branch vessel atherosclerosis. Cholelithiasis. No acute findings in the abdomen or pelvis. Electronically Signed   By: Rolm Baptise M.D.   On: 04/05/2019 22:04    Procedures Procedures (including critical care time)  Medications Ordered in ED Medications  HYDROmorphone (DILAUDID) injection 2 mg (has no administration in time range)     Initial Impression / Assessment and Plan / ED Course  I have reviewed the triage vital signs and the nursing notes.  Pertinent labs & imaging results that were available during my care of the patient were reviewed by me and considered in my medical decision making (see chart for details).        Patient with left flank pain since this morning, getting worse through the day. No fever. H/O kidney stones, per patient.   The patient is seen and appears uncomfortable, periodically screaming out as if the pain spikes intermittently. Anticipate difficult IV access given h/o ESRD-HD and patient report. IM pain medication ordered.   CT renal study performed and is negative for renal or ureteral stones. Patient updated on results. She is having recurrent, severe pain on recheck. Additional medications ordered. Blood pressure found to be significantly elevated. She reports she has not had any of her medications since her last admission to the hospital  (03/09/19). On chart review, blood pressure medications were discontinued during that admission per nephrology. IV hydralazine ordered.   Patient continues to have significant pain without identified cause. Contrasted CT abd/pel ordered and is without acute finding. Blood pressure improved at this time but patient is now vomiting.   6:20 -  multiple medications for nausea and pain have been given without significant relief. No identified cause for symptoms is found. It is felt she would benefit from admission for intractable pain and vomiting and to continue evaluation for cause of symptoms.   Final Clinical Impressions(s) / ED Diagnoses   Final diagnoses:  None   1. Intractable flank pain 2. Intractable nausea and vomiting 3. Hypertension  ED Discharge Orders    None       Charlann Lange, PA-C 04/06/19 [id number]    Merryl Hacker, MD 04/06/19 [phone]

## 2019-04-06 NOTE — ED Notes (Addendum)
Pt yelling out "Jesus, help me, Help me". "You need to bring me something for the pain right now". Pt c/o nausea. Emesis bag given. Pt spitting in bag. No vomiting. Pt rolling all over bed.

## 2019-04-06 NOTE — ED Notes (Signed)
Clear liquid dinner tray ordered 

## 2019-04-06 NOTE — ED Notes (Signed)
Family at bedside. 

## 2019-04-06 NOTE — ED Notes (Signed)
CT made aware that pt is ready for her scan.

## 2019-04-07 DIAGNOSIS — E1121 Type 2 diabetes mellitus with diabetic nephropathy: Secondary | ICD-10-CM

## 2019-04-07 DIAGNOSIS — R109 Unspecified abdominal pain: Principal | ICD-10-CM

## 2019-04-07 LAB — GLUCOSE, CAPILLARY
Glucose-Capillary: 152 mg/dL — ABNORMAL HIGH (ref 70–99)
Glucose-Capillary: 83 mg/dL (ref 70–99)
Glucose-Capillary: 73 mg/dL (ref 70–99)
Glucose-Capillary: 139 mg/dL — ABNORMAL HIGH (ref 70–99)

## 2019-04-07 LAB — BASIC METABOLIC PANEL
Anion gap: 22 — ABNORMAL HIGH (ref 5–15)
BUN: 141 mg/dL — ABNORMAL HIGH (ref 6–20)
CO2: 23 mmol/L (ref 22–32)
Calcium: 8.2 mg/dL — ABNORMAL LOW (ref 8.9–10.3)
Chloride: 93 mmol/L — ABNORMAL LOW (ref 98–111)
Creatinine, Ser: 12.09 mg/dL — ABNORMAL HIGH (ref 0.44–1.00)
GFR calc Af Amer: 4 mL/min — ABNORMAL LOW (ref >60)
GFR calc non Af Amer: 3 mL/min — ABNORMAL LOW (ref >60)
Glucose, Bld: 191 mg/dL — ABNORMAL HIGH (ref 70–99)
Potassium: 7.1 mmol/L (ref 3.5–5.1)
Sodium: 138 mmol/L (ref 135–145)

## 2019-04-07 LAB — CBC
HCT: 25 % — ABNORMAL LOW (ref 36.0–46.0)
Hemoglobin: 8.1 g/dL — ABNORMAL LOW (ref 12.0–15.0)
MCH: 23.9 pg — ABNORMAL LOW (ref 26.0–34.0)
MCHC: 32.4 g/dL (ref 30.0–36.0)
MCV: 73.7 fL — ABNORMAL LOW (ref 80.0–100.0)
Platelets: 126 10*3/uL — ABNORMAL LOW (ref 150–400)
RBC: 3.39 MIL/uL — ABNORMAL LOW (ref 3.87–5.11)
RDW: 17.2 % — ABNORMAL HIGH (ref 11.5–15.5)
WBC: 4.6 10*3/uL (ref 4.0–10.5)
nRBC: 0 % (ref 0.0–0.2)

## 2019-04-07 MED ORDER — TRAMADOL HCL 50 MG PO TABS
50.0000 mg | ORAL_TABLET | Freq: Three times a day (TID) | ORAL | Status: DC | PRN
  Administered 2019-04-07 – 2019-04-08 (×2): 50 mg via ORAL
  Filled 2019-04-07 (×2): qty 1

## 2019-04-07 MED ORDER — ALBUTEROL SULFATE (2.5 MG/3ML) 0.083% IN NEBU: 2.5000 mg | INHALATION_SOLUTION | Freq: Four times a day (QID) | RESPIRATORY_TRACT | Status: DC | PRN

## 2019-04-07 MED ORDER — HYDROMORPHONE HCL 1 MG/ML IJ SOLN
0.5000 mg | Freq: Four times a day (QID) | INTRAMUSCULAR | Status: DC | PRN
  Administered 2019-04-07: 19:00:00 0.5 mg via INTRAVENOUS
  Filled 2019-04-07 (×2): qty 0.5

## 2019-04-07 MED ORDER — LORAZEPAM 0.5 MG PO TABS
0.5000 mg | ORAL_TABLET | Freq: Two times a day (BID) | ORAL | Status: DC | PRN
  Administered 2019-04-08: 0.5 mg via ORAL
  Filled 2019-04-07 (×2): qty 1

## 2019-04-07 NOTE — ED Notes (Signed)
Pt is asleep. Continues to remove all cardiac, O2, B/P monitoring cords. Pt is stable at this time. Will continue to monitor. Resp even and non-labored. Skin warm and dry.

## 2019-04-07 NOTE — Procedures (Signed)
I was present at this dialysis session. I have reviewed the session itself and made appropriate changes.   Vital signs in last 24 hours:  Temp:  [94.6 F (34.8 C)-97.8 F (36.6 C)] 94.6 F (34.8 C) (11/05 0730) Pulse Rate:  [70-100] 75 (11/05 0900) Resp:  [12-22] 14 (11/05 0745) BP: (126-214)/(65-109) 157/77 (11/05 0900) SpO2:  [94 %-100 %] 97 % (11/05 0730) Weight:  [86.5 kg] 86.5 kg (11/05 0730) Weight change:  Filed Weights   04/07/19 0730  Weight: 86.5 kg    Recent Labs  Lab 04/07/19 0604  NA 138  K 7.1*  CL 93*  CO2 23  GLUCOSE 191*  BUN 141*  CREATININE 12.09*  CALCIUM 8.2*    Recent Labs  Lab 04/05/19 1509 04/06/19 1107 04/07/19 0604  WBC 7.5 6.6 4.6  HGB 10.4* 9.8* 8.1*  HCT 33.4* 32.7* 25.0*  MCV 75.4* 77.7* 73.7*  PLT 213 192 126*    Scheduled Meds: . atomoxetine  40 mg Oral Daily  . calcitRIOL  0.25 mcg Oral Q T,Th,Sa-HD  . Chlorhexidine Gluconate Cloth  6 each Topical Q0600  . clopidogrel  75 mg Oral Daily  . escitalopram  10 mg Oral Daily  . gabapentin  100 mg Oral TID  . heparin  5,000 Units Subcutaneous Q8H  .  HYDROmorphone (DILAUDID) injection  0.5 mg Intravenous Once  . insulin aspart  0-5 Units Subcutaneous QHS  . insulin aspart  0-9 Units Subcutaneous TID WC  . insulin detemir  8 Units Subcutaneous QHS  . latanoprost  1 drop Both Eyes QHS  . lidocaine  1 patch Transdermal Q24H  . multivitamin  1 tablet Oral QHS  . QUEtiapine  100 mg Oral QHS  . sevelamer carbonate  2,400 mg Oral TID WC  . sodium zirconium cyclosilicate  10 g Oral Daily   Continuous Infusions: PRN Meds:.albuterol, hydrALAZINE, HYDROmorphone (DILAUDID) injection, HYDROmorphone (DILAUDID) injection, LORazepam, ondansetron **OR** ondansetron (ZOFRAN) IV, senna-docusate, traZODone     Dialysis Orders: Center: [facility] on TTS. Time: 4 hours, 180NRe, BFR 400, DFR 800. EDW 84kg, 2K, 2.0Ca, LUE AVF No heparin Calcitriol 0.70mcg PO TIW Renvela 800 mg  3-4 tabs TIW with meals  Assessment/Plan: 1.  Abdominal pain/Nausea: Patient appears to be significantly uncomfortable despite pain medications. CT renal stone study and CT abdomen pelvis without acute findings. Amylase elevated. Per primary.   1. Currently sleeping peacefully on HD and has been since around 3 am.  Unclear etiology of pain and odd behavior. 2. Recommend psych eval if persists today. 2.  ESRD:  TTS, missed treatment 10/31 but did have an abbreviated treatment on 11/3. BUN is elevated today. Patient would likely not tolerate HD at this time due to severe pain/inability to sit still and tachycardia. Repeat BMP and plan for HD in the morning.  3. Hyperkalemia: Ordered lokelma x1 dose. Currently on HD with low K bath  4.  Hypertension/volume: Patient significantly hypertensive. BP meds discontinued at last admission. Started on lisinopril and hydralazine. Does not appear volume overloaded, however had not been reaching EDW as outpatient and BP appears to be persistently elevated. Will plan UF as tolerated with HD tomorrow, get daily weights to assess further once patient is stable.  5.  Anemia: Hemoglobin 9.8 and dropping to 8.8.  Resume ESA and follow.  Transfuse prn. 6.  Metabolic bone disease: Corrected calcium 8.6. Continue calcitriol and renvela 7.  Nutrition:  Currently on clear liquid diet.   Donetta Potts,  MD 04/07/2019, 9:16 AM

## 2019-04-07 NOTE — ED Notes (Addendum)
Pt continues to yell from her room. But when entering her room, pt will not tell this RN what is wrong. Pt demands to leave the door open. Pt made aware that her door will remain closed until she decides to stop yelling, due to other pts sleeping in this area.

## 2019-04-07 NOTE — Progress Notes (Signed)
Patient ID: Lauren Warner, female   DOB: 03/26/1966, 53 y.o.   MRN: [id number]  PROGRESS NOTE    Talecia Greunke  [id number] DOB: [birth year] DOA: 04/05/2019 PCP: [name], [name]   Brief Narrative:  53 year old female with history of ESRD on hemodialysis, hypertension, diabetes mellitus type 2, chronic anemia, CAD status post PCI, history of left BKA, currently living in a hotel presented on with nausea, vomiting and flank pain for the last 2 days.  She was found to have extremely elevated blood pressure on presentation, COVID-19 test was negative.  CT renal stone study was negative for renal or ureteral stones, no hydronephrosis, cholelithiasis, no acute findings in the abdomen or pelvis.  CT abdomen and pelvis was repeated with contrast which showed no acute intra-abdominal or intrapelvic process.  Multiple small cystic lesions in the pancreatic body, some of which were stable from prior study in 2019, others appear new and recommended MRI of the abdomen with and without contrast on nonemergent basis; also was seen cholelithiasis without acute inflammation and wall thickening of distal esophagus, correlate for symptoms of esophagitis.  Nephrology was consulted.  Assessment & Plan:   Nausea, vomiting, left flank and back pain -Unclear etiology.  CT renal study as well as CT abdomen and pelvis with contrast with no acute findings. No obstructing nephrolithiasis or acute cholecystitis -No significant elevation in lipase. -Currently on clear liquid diet.  Will advance to renal hemodialysis diet if tolerated. -Be cautious with the use of intravenous Dilaudid.  Patient is currently extremely sleepy.  Decrease Dilaudid to 0.5 mg IV every 6 hours as needed severe pain.  Tramadol can be used for moderate pain.  Extreme agitation -Patient was apparently extremely agitated overnight and was yelling and screaming.  Currently, she is calm.  Will monitor.  If symptoms persist, might need  psychiatric evaluation.  End-stage renal disease on hemodialysis  -Continue dialysis as per nephrology schedule and recommendations  Diabetes mellitus type 2 uncontrolled with hyperglycemia  -hemoglobin A1c 9.6.  Continue Levemir along with CBGs with SSI  Esophagitis per CT -Continue with Protonix.  Might need outpatient GI evaluation   DVT prophylaxis: Heparin Code Status: Full Family Communication: None at bedside Disposition Plan: Home if clinically improved in the next 1 to 2 days  Consultants: Nephrology  Procedures: None  Antimicrobials: None   Subjective: Patient seen and examined at bedside undergoing dialysis.  She is in deep sleep, hardly wakes up on calling her name.  As per the nursing staff, she was extremely agitated overnight and early this morning.  No overnight fever or vomiting.  Objective: Vitals:   04/07/19 0930 04/07/19 1000 04/07/19 1030 04/07/19 1100  BP: (!) 163/85 (!) 180/96 (!) 147/85 (!) 147/61  Pulse: 82 88 68 63  Resp:  (!) 24    Temp:      TempSrc:      SpO2:  100%    Weight:       No intake or output data in the 24 hours ending 04/07/19 1145 Filed Weights   04/07/19 0730  Weight: 86.5 kg    Examination:  General exam: Appears calm and comfortable.  Hardly wakes up on calling her name. Respiratory system: Bilateral decreased breath sounds at bases with some scattered crackles Cardiovascular system: S1 & S2 heard, Rate controlled Gastrointestinal system: Abdomen is nondistended, soft and nontender. Normal bowel sounds heard. Extremities: No cyanosis, clubbing; left BKA present   Data Reviewed: I have personally reviewed following labs and imaging studies  CBC: Recent Labs  Lab 04/05/19 1509 04/06/19 1107 04/07/19 0604  WBC 7.5 6.6 4.6  HGB 10.4* 9.8* 8.1*  HCT 33.4* 32.7* 25.0*  MCV 75.4* 77.7* 73.7*  PLT 213 192 [id number]*   Basic Metabolic Panel: Recent Labs  Lab 04/05/19 1509 04/06/19 1107 04/07/19 0604  NA 140 140 138   K 4.4 5.7* 7.1*  CL 96* 96* 93*  CO2 23 19* 23  GLUCOSE 245* 295* 191*  BUN 101* 128* 141*  CREATININE 8.91* 10.71* 12.09*  CALCIUM 7.9* 8.0* 8.2*   GFR: Estimated Creatinine Clearance: 6.2 mL/min (A) (by C-G formula based on SCr of 12.09 mg/dL (H)). Liver Function Tests: Recent Labs  Lab 04/05/19 1509 04/06/19 1107  AST 12* 10*  ALT 18 14  ALKPHOS 96 81  BILITOT 0.6 0.8  PROT 7.3 6.6  ALBUMIN 3.5 3.3*   Recent Labs  Lab 04/05/19 1509 04/06/19 1107  LIPASE 101* 71*  AMYLASE  --  177*   No results for input(s): AMMONIA in the last 168 hours. Coagulation Profile: No results for input(s): INR, PROTIME in the last 168 hours. Cardiac Enzymes: Recent Labs  Lab 04/06/19 1107  CKTOTAL 124   BNP (last 3 results) No results for input(s): PROBNP in the last 8760 hours. HbA1C: Recent Labs    04/06/19 1107  HGBA1C 9.6*   CBG: Recent Labs  Lab 04/05/19 2011 04/06/19 1413 04/06/19 2338 04/07/19 0734  GLUCAP 275* 316* 238* 152*   Lipid Profile: No results for input(s): CHOL, HDL, LDLCALC, TRIG, CHOLHDL, LDLDIRECT in the last 72 hours. Thyroid Function Tests: No results for input(s): TSH, T4TOTAL, FREET4, T3FREE, THYROIDAB in the last 72 hours. Anemia Panel: No results for input(s): VITAMINB12, FOLATE, FERRITIN, TIBC, IRON, RETICCTPCT in the last 72 hours. Sepsis Labs: Recent Labs  Lab 04/06/19 0158  LATICACIDVEN 1.0    Recent Results (from the past 240 hour(s))  SARS CORONAVIRUS 2 (TAT 6-24 HRS) Nasopharyngeal Nasopharyngeal Swab     Status: None   Collection Time: 04/06/19  6:33 AM   Specimen: Nasopharyngeal Swab  Result Value Ref Range Status   SARS Coronavirus 2 NEGATIVE NEGATIVE Final    Comment: (NOTE) SARS-CoV-2 target nucleic acids are NOT DETECTED. The SARS-CoV-2 RNA is generally detectable in upper and lower respiratory specimens during the acute phase of infection. Negative results do not preclude SARS-CoV-2 infection, do not rule out  co-infections with other pathogens, and should not be used as the sole basis for treatment or other patient management decisions. Negative results must be combined with clinical observations, patient history, and epidemiological information. The expected result is Negative. Fact Sheet for Patients: SugarRoll.be Fact Sheet for Healthcare Providers: https://www.woods-mathews.com/ This test is not yet approved or cleared by the [place] FDA and  has been authorized for detection and/or diagnosis of SARS-CoV-2 by FDA under an Emergency Use Authorization (EUA). This EUA will remain  in effect (meaning this test can be used) for the duration of the COVID-19 declaration under Section 56 4(b)(1) of the Act, 21 U.S.C. section 360bbb-3(b)(1), unless the authorization is terminated or revoked sooner. Performed at [facility], [place] [address], [place], [place] [address]          Radiology Studies: Dg Abdomen 1 View  Result Date: 04/06/2019 CLINICAL DATA:  53 year old female with nausea vomiting. EXAM: ABDOMEN - 1 VIEW COMPARISON:  CT of the abdomen pelvis dated 04/06/2019. FINDINGS: Evaluation is limited due to body habitus and soft tissue attenuation. There is no bowel dilatation  or evidence of obstruction. No free air identified. Multiple echogenic structures in the right flank correspond to the gallstones. There is vascular calcification. An IVC filter is noted. The osseous structures are intact. IMPRESSION: 1. No bowel obstruction. 2. Cholelithiasis. 3. Vascular calcification. Electronically Signed   By: Anner Crete M.D.   On: 04/06/2019 16:07   Ct Abdomen Pelvis W Contrast  Result Date: 04/06/2019 CLINICAL DATA:  Abdominal pain. Left-sided abdominal pain EXAM: CT ABDOMEN AND PELVIS WITH CONTRAST TECHNIQUE: Multidetector CT imaging of the abdomen and pelvis was performed using the standard protocol following bolus administration of  intravenous contrast. CONTRAST:  152mL OMNIPAQUE IOHEXOL 300 MG/ML  SOLN COMPARISON:  April 05, 2019 FINDINGS: Lower chest: The lung bases are clear. The heart size is normal. Hepatobiliary: The liver is normal. Cholelithiasis without acute inflammation.There is no biliary ductal dilation. Pancreas: There are multiple small cystic lesions in the pancreatic body, some of which are stable from prior study in 2019, while others appear new. Spleen: No splenic laceration or hematoma. Adrenals/Urinary Tract: --Adrenal glands: No adrenal hemorrhage. --Right kidney/ureter: No hydronephrosis or perinephric hematoma. --Left kidney/ureter: No hydronephrosis or perinephric hematoma. --Urinary bladder: Unremarkable. Stomach/Bowel: --Stomach/Duodenum: There is wall thickening of the distal esophagus. --Small bowel: No dilatation or inflammation. --Colon: No focal abnormality. --Appendix: Not visualized. No right lower quadrant inflammation or free fluid. Vascular/Lymphatic: Atherosclerotic calcification is present within the non-aneurysmal abdominal aorta, without hemodynamically significant stenosis. There is an IVC filter in place --there mild enlarged retroperitoneal lymph nodes. --No mesenteric lymphadenopathy. --there is few prominent pelvic lymph nodes. Reproductive: There is a fibroid uterus. Other: No ascites or free air. The abdominal wall is normal. Musculoskeletal. No acute displaced fractures. IMPRESSION: 1. No acute intra-abdominal or intrapelvic process. 2. Multiple small cystic lesions in the pancreatic body, some of which are stable from prior study in 2019, while others appear new. These could be further evaluated with MRI of the abdomen with and without contrast on a nonemergent basis. 3. Cholelithiasis without acute inflammation. 4. Aortic atherosclerosis. 5. There is wall thickening of the distal esophagus. Correlate for symptoms of esophagitis. 6. Fibroid uterus. Aortic Atherosclerosis (ICD10-I70.0).  Electronically Signed   By: Constance Holster M.D.   On: 04/06/2019 05:05   Ct Renal Stone Study  Result Date: 04/05/2019 CLINICAL DATA:  Left side abdominal pain, nausea, vomiting, left flank pain EXAM: CT ABDOMEN AND PELVIS WITHOUT CONTRAST TECHNIQUE: Multidetector CT imaging of the abdomen and pelvis was performed following the standard protocol without IV contrast. COMPARISON:  12/23/2017 FINDINGS: Lower chest: Cardiomegaly. Dense calcifications in the visualized right coronary artery. No acute abnormality. Hepatobiliary: Layering gallstones within the gallbladder. No focal hepatic abnormality. Pancreas: No focal abnormality or ductal dilatation. Spleen: No focal abnormality.  Normal size. Adrenals/Urinary Tract: No adrenal abnormality. No focal renal abnormality. No stones or hydronephrosis. Urinary bladder is unremarkable. Stomach/Bowel: Stomach, large and small bowel grossly unremarkable. Vascular/Lymphatic: Aortic and branch vessel atherosclerosis. No aneurysm or adenopathy. IVC filter noted. Reproductive: Calcifications in the uterus compatible with fibroids. No adnexal mass. Other: No free fluid or free air. Musculoskeletal: No acute bony abnormality. IMPRESSION: No evidence of renal or ureteral stones.  No hydronephrosis. Coronary artery disease. Aortic atherosclerosis and branch vessel atherosclerosis. Cholelithiasis. No acute findings in the abdomen or pelvis. Electronically Signed   By: Rolm Baptise M.D.   On: 04/05/2019 22:04        Scheduled Meds: . atomoxetine  40 mg Oral Daily  . calcitRIOL  0.25 mcg Oral Q  T,Th,Sa-HD  . Chlorhexidine Gluconate Cloth  6 each Topical Q0600  . clopidogrel  75 mg Oral Daily  . escitalopram  10 mg Oral Daily  . gabapentin  100 mg Oral TID  . heparin  5,000 Units Subcutaneous Q8H  .  HYDROmorphone (DILAUDID) injection  0.5 mg Intravenous Once  . insulin aspart  0-5 Units Subcutaneous QHS  . insulin aspart  0-9 Units Subcutaneous TID WC  . insulin  detemir  8 Units Subcutaneous QHS  . latanoprost  1 drop Both Eyes QHS  . lidocaine  1 patch Transdermal Q24H  . multivitamin  1 tablet Oral QHS  . QUEtiapine  100 mg Oral QHS  . sevelamer carbonate  2,400 mg Oral TID WC  . sodium zirconium cyclosilicate  10 g Oral Daily   Continuous Infusions:        Aline August, MD [facility] 04/07/2019, 11:45 AM

## 2019-04-07 NOTE — Progress Notes (Addendum)
Patient still lethargic this evening and having confusion. Arousing to voice. Patient has had no PO intake today and last BG check was 73. Still complaining of back/side pain. Will encourage dinner/fluids and pass along information to evening nurse.   1800: Patient much more alert when awoken for dinner.

## 2019-04-07 NOTE — Progress Notes (Addendum)
Patient arrived to 5W from ED. Patient is asleep. Vital signs stable. Patient had multiple bags of belongings at bedside and a jacket. Left upper extremity AV fistula, 20G Right FA. Left AKA. Telemetry set up and called. Call light within reach, bed in lowest position. Will continue to monitor.

## 2019-04-07 NOTE — ED Notes (Signed)
Pt took all meds without issue. Will continue to monitor.

## 2019-04-07 NOTE — ED Notes (Addendum)
Report called to Judson Roch, RN on 5W. Pt going to room 32. Pt is asleep at this time. VSS.

## 2019-04-07 NOTE — Plan of Care (Signed)

## 2019-04-07 NOTE — Progress Notes (Addendum)
Patient arrived back to unit from hemodialysis. Was crying out loudly and would open eyes to loud voice. Patient is alert x2. Tramadol given and lidocaine patch placed on lower back. MD Starla Link saw patient in HD and is aware of her current state. Will continue to monitor patient mentation and pain status.

## 2019-04-08 ENCOUNTER — Encounter (HOSPITAL_COMMUNITY): Payer: Self-pay

## 2019-04-08 LAB — GLUCOSE, CAPILLARY
Glucose-Capillary: 40 mg/dL — CL (ref 70–99)
Glucose-Capillary: 174 mg/dL — ABNORMAL HIGH (ref 70–99)
Glucose-Capillary: 175 mg/dL — ABNORMAL HIGH (ref 70–99)
Glucose-Capillary: 165 mg/dL — ABNORMAL HIGH (ref 70–99)
Glucose-Capillary: 48 mg/dL — ABNORMAL LOW (ref 70–99)
Glucose-Capillary: 140 mg/dL — ABNORMAL HIGH (ref 70–99)
Glucose-Capillary: 68 mg/dL — ABNORMAL LOW (ref 70–99)

## 2019-04-08 LAB — CBC WITH DIFFERENTIAL/PLATELET
Abs Immature Granulocytes: 0 10*3/uL (ref 0.00–0.07)
Basophils Absolute: 0.1 10*3/uL (ref 0.0–0.1)
Basophils Relative: 1 %
Eosinophils Absolute: 0.2 10*3/uL (ref 0.0–0.5)
Eosinophils Relative: 4 %
HCT: 31.1 % — ABNORMAL LOW (ref 36.0–46.0)
Hemoglobin: 9.5 g/dL — ABNORMAL LOW (ref 12.0–15.0)
Immature Granulocytes: 0 %
Lymphocytes Relative: 41 %
Lymphs Abs: 1.6 10*3/uL (ref 0.7–4.0)
MCH: 23.3 pg — ABNORMAL LOW (ref 26.0–34.0)
MCHC: 30.5 g/dL (ref 30.0–36.0)
MCV: 76.4 fL — ABNORMAL LOW (ref 80.0–100.0)
Monocytes Absolute: 0.4 10*3/uL (ref 0.1–1.0)
Monocytes Relative: 10 %
Neutro Abs: 1.7 10*3/uL (ref 1.7–7.7)
Neutrophils Relative %: 44 %
Platelets: 213 10*3/uL (ref 150–400)
RBC: 4.07 MIL/uL (ref 3.87–5.11)
RDW: 17.5 % — ABNORMAL HIGH (ref 11.5–15.5)
WBC: 3.9 10*3/uL — ABNORMAL LOW (ref 4.0–10.5)
nRBC: 0 % (ref 0.0–0.2)

## 2019-04-08 LAB — COMPREHENSIVE METABOLIC PANEL
ALT: 18 U/L (ref 0–44)
AST: 13 U/L — ABNORMAL LOW (ref 15–41)
Albumin: 2.8 g/dL — ABNORMAL LOW (ref 3.5–5.0)
Alkaline Phosphatase: 74 U/L (ref 38–126)
Anion gap: 14 (ref 5–15)
BUN: 44 mg/dL — ABNORMAL HIGH (ref 6–20)
CO2: 27 mmol/L (ref 22–32)
Calcium: 8.3 mg/dL — ABNORMAL LOW (ref 8.9–10.3)
Chloride: 96 mmol/L — ABNORMAL LOW (ref 98–111)
Creatinine, Ser: 6.33 mg/dL — ABNORMAL HIGH (ref 0.44–1.00)
GFR calc Af Amer: 8 mL/min — ABNORMAL LOW (ref >60)
GFR calc non Af Amer: 7 mL/min — ABNORMAL LOW (ref >60)
Glucose, Bld: 71 mg/dL (ref 70–99)
Potassium: 4.1 mmol/L (ref 3.5–5.1)
Sodium: 137 mmol/L (ref 135–145)
Total Bilirubin: 0.7 mg/dL (ref 0.3–1.2)
Total Protein: 6.2 g/dL — ABNORMAL LOW (ref 6.5–8.1)

## 2019-04-08 MED ORDER — DEXTROSE 50 % IV SOLN
INTRAVENOUS | Status: AC
  Filled 2019-04-08: qty 50

## 2019-04-08 MED ORDER — DARBEPOETIN ALFA 40 MCG/0.4ML IJ SOSY: 40.0000 ug | PREFILLED_SYRINGE | INTRAMUSCULAR | Status: DC

## 2019-04-08 MED ORDER — GABAPENTIN 100 MG PO CAPS: 100.0000 mg | ORAL_CAPSULE | Freq: Three times a day (TID) | ORAL | 0 refills | Status: AC

## 2019-04-08 MED ORDER — TRAZODONE HCL 50 MG PO TABS: 50.0000 mg | ORAL_TABLET | Freq: Every evening | ORAL | 0 refills | Status: AC | PRN

## 2019-04-08 MED ORDER — LIDOCAINE 5 % EX PTCH: 1.0000 | MEDICATED_PATCH | CUTANEOUS | 0 refills | Status: AC

## 2019-04-08 MED ORDER — NOVOLOG FLEXPEN 100 UNIT/ML ~~LOC~~ SOPN: 3.0000 [IU] | PEN_INJECTOR | Freq: Three times a day (TID) | SUBCUTANEOUS | 0 refills | Status: AC

## 2019-04-08 MED ORDER — SODIUM ZIRCONIUM CYCLOSILICATE 10 G PO PACK: 10.0000 g | PACK | Freq: Every day | ORAL | 0 refills | Status: AC

## 2019-04-08 MED ORDER — GLUCOSE 40 % PO GEL
2.0000 | ORAL | Status: DC
  Filled 2019-04-08: qty 2

## 2019-04-08 MED ORDER — PANTOPRAZOLE SODIUM 40 MG PO TBEC: 40.0000 mg | DELAYED_RELEASE_TABLET | Freq: Every day | ORAL | 0 refills | Status: AC

## 2019-04-08 MED ORDER — CALCIUM ACETATE (PHOS BINDER) 667 MG PO CAPS: 667.0000 mg | ORAL_CAPSULE | Freq: Three times a day (TID) | ORAL | 0 refills | Status: AC

## 2019-04-08 MED ORDER — BLOOD PRESSURE KIT: PACK | 0 refills | Status: AC

## 2019-04-08 MED ORDER — QUETIAPINE FUMARATE 100 MG PO TABS: 100.0000 mg | ORAL_TABLET | Freq: Every day | ORAL | 0 refills | Status: AC

## 2019-04-08 MED ORDER — METHOCARBAMOL 500 MG PO TABS: 500.0000 mg | ORAL_TABLET | Freq: Four times a day (QID) | ORAL | Status: DC | PRN

## 2019-04-08 MED ORDER — SEVELAMER CARBONATE 800 MG PO TABS: 2400.0000 mg | ORAL_TABLET | Freq: Three times a day (TID) | ORAL | 0 refills | Status: AC

## 2019-04-08 MED ORDER — CALCITRIOL 0.25 MCG PO CAPS: 0.2500 ug | ORAL_CAPSULE | ORAL | 0 refills | Status: AC

## 2019-04-08 MED ORDER — METHOCARBAMOL 500 MG PO TABS: 500.0000 mg | ORAL_TABLET | Freq: Four times a day (QID) | ORAL | 0 refills | Status: AC | PRN

## 2019-04-08 MED ORDER — RENA-VITE PO TABS: 1.0000 | ORAL_TABLET | Freq: Every day | ORAL | 0 refills | Status: AC

## 2019-04-08 MED ORDER — ESCITALOPRAM OXALATE 10 MG PO TABS: 10.0000 mg | ORAL_TABLET | Freq: Every day | ORAL | 0 refills | Status: AC

## 2019-04-08 MED ORDER — TRAMADOL HCL 50 MG PO TABS: 50.0000 mg | ORAL_TABLET | Freq: Three times a day (TID) | ORAL | 0 refills | Status: DC | PRN

## 2019-04-08 MED ORDER — BLOOD GLUCOSE MONITOR KIT: PACK | 0 refills | Status: AC

## 2019-04-08 MED ORDER — CLOPIDOGREL BISULFATE 75 MG PO TABS: 75.0000 mg | ORAL_TABLET | Freq: Every day | ORAL | 0 refills | Status: AC

## 2019-04-08 MED ORDER — INSULIN PEN NEEDLE 31G X 5 MM MISC: 0 refills | Status: AC

## 2019-04-08 MED ORDER — ONDANSETRON HCL 4 MG PO TABS: 4.0000 mg | ORAL_TABLET | Freq: Four times a day (QID) | ORAL | 0 refills | Status: AC | PRN

## 2019-04-08 MED ORDER — DEXTROSE 50 % IV SOLN
25.0000 g | INTRAVENOUS | Status: AC
  Administered 2019-04-08: 04:00:00 25 g via INTRAVENOUS

## 2019-04-08 MED FILL — METHOCARBAMOL 500 MG TABS: 500 | 5 days supply | Qty: 20 | Fill #0

## 2019-04-08 MED FILL — CALCITRIOL 0.25 MCG CAPSULE: 0.25 | 28 days supply | Qty: 12 | Fill #0

## 2019-04-08 MED FILL — NOVOLOG FLEXPEN SYRINGE: 100 | 30 days supply | Qty: 3 | Fill #0

## 2019-04-08 MED FILL — GABAPENTIN 100 MG CAPSULE: 100 | 30 days supply | Qty: 90 | Fill #0

## 2019-04-08 MED FILL — LOKELMA 10 GM PACK: 10 | 14 days supply | Qty: 14 | Fill #0

## 2019-04-08 MED FILL — PENTIPS 31G X 8 MM MISC: 31G X 8 MM | 30 days supply | Qty: 100 | Fill #0

## 2019-04-08 MED FILL — CLOPIDOGREL 75 MG TABLET: 75 | 30 days supply | Qty: 30 | Fill #0

## 2019-04-08 MED FILL — RENA-VITE TABLET: 30 days supply | Qty: 30 | Fill #0

## 2019-04-08 MED FILL — traZODone HCL 50 MG TABS: 50 | 10 days supply | Qty: 10 | Fill #0

## 2019-04-08 MED FILL — PANTOPRAZOLE SOD DR 40 MG T: 40 | 30 days supply | Qty: 30 | Fill #0

## 2019-04-08 MED FILL — traMADol HCL 50 MG TABS: 50 | 5 days supply | Qty: 14 | Fill #0

## 2019-04-08 MED FILL — ESCITALOPRAM 10 MG TABLET: 10 | 30 days supply | Qty: 30 | Fill #0

## 2019-04-08 MED FILL — ONDANSETRON HCL 4 MG TABLET: 4 | 5 days supply | Qty: 20 | Fill #0

## 2019-04-08 NOTE — Progress Notes (Addendum)
[facility] Progress Note   Subjective:  Seen in room. Alert, feeling better today. Denies N,V. Back pain improving. Feels "stressed" Completed HD yesterday.   Objective Vitals:   04/07/19 1226 04/07/19 1235 04/07/19 2139 04/08/19 0550  BP:  (!) 167/78 (!) 142/69 135/73  Pulse:  93 84 83  Resp:  20    Temp:  98.2 F (36.8 C) 98.3 F (36.8 C) 98.3 F (36.8 C)  TempSrc:  Oral Oral Oral  SpO2:  100% 100% 100%  Weight: 86.1 kg       Physical Exam General: WNWD female NAD Heart: RRR Lungs: CTAB  Abdomen:  BS+ Soft NTND  Extremities: L BKA No sig LE edema  Dialysis Access: LUE AVF +bruit    Weight change:    Additional Objective Labs: Basic Metabolic Panel: Recent Labs  Lab 04/06/19 1107 04/07/19 0604 04/08/19 0210  NA 140 138 137  K 5.7* 7.1* 4.1  CL 96* 93* 96*  CO2 19* 23 27  GLUCOSE 295* 191* 71  BUN 128* 141* 44*  CREATININE 10.71* 12.09* 6.33*  CALCIUM 8.0* 8.2* 8.3*   CBC: Recent Labs  Lab 04/05/19 1509 04/06/19 1107 04/07/19 0604 04/08/19 0210  WBC 7.5 6.6 4.6 3.9*  NEUTROABS  --   --   --  1.7  HGB 10.4* 9.8* 8.1* 9.5*  HCT 33.4* 32.7* 25.0* 31.1*  MCV 75.4* 77.7* 73.7* 76.4*  PLT 213 192 126* 213   Blood Culture    Component Value Date/Time   SDES URINE, CLEAN CATCH 01/08/2018 1132   SPECREQUEST  01/08/2018 1132    NONE Performed at [facility], [place] [address], [place], [place] [address]    CULT MULTIPLE SPECIES PRESENT, SUGGEST RECOLLECTION (A) 01/08/2018 1132   REPTSTATUS 01/09/2018 FINAL 01/08/2018 1132     Medications:  . atomoxetine  40 mg Oral Daily  . calcitRIOL  0.25 mcg Oral Q T,Th,Sa-HD  . Chlorhexidine Gluconate Cloth  6 each Topical Q0600  . clopidogrel  75 mg Oral Daily  . dextrose  2 Tube Oral STAT  . dextrose      . escitalopram  10 mg Oral Daily  . gabapentin  100 mg Oral TID  . heparin  5,000 Units Subcutaneous Q8H  .  HYDROmorphone (DILAUDID) injection  0.5 mg Intravenous Once  .  insulin aspart  0-5 Units Subcutaneous QHS  . insulin aspart  0-9 Units Subcutaneous TID WC  . insulin detemir  8 Units Subcutaneous QHS  . latanoprost  1 drop Both Eyes QHS  . lidocaine  1 patch Transdermal Q24H  . multivitamin  1 tablet Oral QHS  . QUEtiapine  100 mg Oral QHS  . sevelamer carbonate  2,400 mg Oral TID WC  . sodium zirconium cyclosilicate  10 g Oral Daily    Dialysis Orders: Center: [facility] on TTS. Time: 4 hours, 180NRe, BFR 400, DFR 800. EDW 84kg, 2K, 2.0Ca, LUE AVF No heparin Calcitriol 0.18mcg PO TIW Renvela 800 mg 3-4 tabs TIW with meals  Assessment/Plan: 1.  Abdominal pain/Nausea:  CT renal stone study and CT abdomen pelvis without acute findings. Improving. Per primary.  2.  ESRD: HD TTS. Next HD 11/7.  3. Hyperkalemia:  Resolved. K+ 4.1  4. Hypertension/volume:  Hypertensive on admission. BP improved with HD/meds. Net UF 3L post HD wt 86.1 kg on 11/5. UF to EDW as tolerated. 5.  Anemia: Hemoglobin 9.5. Start ESA with HD [id number].  6.  Metabolic bone disease: Corrected  calcium 8.6. Continue calcitriol and renvela 7.  Nutrition:  Renal diet/fluid restriction   Lynnda Child PA-C [facility] Pager [phone] 04/08/2019,9:52 AM  LOS: 2 days   I have seen and examined this patient and agree with plan and assessment in the above note with renal recommendations/intervention highlighted.  She feels much better today and thinks a lot of her discomfort is due to stress dealing with homelessness and trying to get her benefits transferred from [place].  Thinks they were muscle spasms. [place] John A Ivry Pigue,MD 04/08/2019 1:44 PM

## 2019-04-08 NOTE — Discharge Summary (Signed)
Physician Discharge Summary  Malene Blaydes TMH:[id number] DOB: 01/05/1966 DOA: 04/05/2019  PCP: [name], [name]  Admit date: 04/05/2019 Discharge date: 04/08/2019  Admitted From: Home Disposition: Home  Recommendations for Outpatient Follow-up:  1. Follow up with PCP in 1 week with repeat CBC/BMP 2. Consider outpatient GI evaluation 3. Follow-up with dialysis as an outpatient 4. Follow up in ED if symptoms worsen or new appear   Home Health: No Equipment/Devices: None  Discharge Condition: Stable CODE STATUS: Full Diet recommendation: Heart healthy/carb modified/renal hemodialysis diet  Brief/Interim Summary: 53 year old female with history of ESRD on hemodialysis, hypertension, diabetes mellitus type 2, chronic anemia, CAD status post PCI, history of left BKA, currently living in a hotel presented on with nausea, vomiting and flank pain for the last 2 days.  She was found to have extremely elevated blood pressure on presentation, COVID-19 test was negative.  CT renal stone study was negative for renal or ureteral stones, no hydronephrosis, cholelithiasis, no acute findings in the abdomen or pelvis.  CT abdomen and pelvis was repeated with contrast which showed no acute intra-abdominal or intrapelvic process.  Multiple small cystic lesions in the pancreatic body, some of which were stable from prior study in 2019, others appear new and recommended MRI of the abdomen with and without contrast on nonemergent basis; also was seen cholelithiasis without acute inflammation and wall thickening of distal esophagus, correlate for symptoms of esophagitis.  Nephrology was consulted.  During the hospitalization, her abdominal pain improved.  She is tolerating diet.  She will be discharged today with outpatient follow-up with PCP.  Might consider GI evaluation.  Discharge Diagnoses:   Nausea, vomiting, left flank and back pain -Unclear etiology.  CT renal study as well as CT abdomen and  pelvis with contrast with no acute findings. No obstructing nephrolithiasis or acute cholecystitis -No significant elevation in lipase. -Much improved.  Tolerating diet. -Discharge patient today.  Might need outpatient GI evaluation.    Extreme agitation -Patient was apparently extremely agitated overnight and was yelling and screaming.  Currently, she is calm.   -Outpatient evaluation with psychiatry.  Continue Seroquel and Lexapro.  End-stage renal disease on hemodialysis  -Continue dialysis as per nephrology schedule and recommendations.  Follow-up with dialysis as an outpatient  Diabetes mellitus type 2 uncontrolled with hyperglycemia  -hemoglobin A1c 9.6.    Continue home regimen.  Will avoid long-acting insulin for now because of episodes of hypoglycemia.  Outpatient follow-up  Esophagitis per CT -Continue with Protonix.  Might need outpatient GI evaluation  Discharge Instructions  Discharge Instructions    Ambulatory referral to Gastroenterology   Complete by: As directed    Abdominal pain   Diet - low sodium heart healthy   Complete by: As directed    Diet Carb Modified   Complete by: As directed    Increase activity slowly   Complete by: As directed      Allergies as of 04/08/2019      Reactions   Adhesive [tape] Other (See Comments)   "RIPS MY SKIN," [place]!!   Other Rash   "RIPS MY SKIN," SO PLEASE USE COBAN WRAP!! "RIPS MY SKIN," SO PLEASE USE COBAN WRAP!!   Hydrocodone-acetaminophen Other (See Comments)   "Makes me feel crazy"   Metformin And Related Other (See Comments)   "Makes me feel crazy"      Medication List    STOP taking these medications   hydrOXYzine 25 MG tablet Commonly known as: ATARAX/VISTARIL  insulin lispro 100 UNIT/ML KiwkPen Commonly known as: HUMALOG     TAKE these medications   albuterol 108 (90 Base) MCG/ACT inhaler Commonly known as: ProAir HFA Inhale 2 puffs into the lungs every 6 (six) hours as needed  for wheezing or shortness of breath.   aspirin EC 81 MG tablet Take 1 tablet (81 mg total) by mouth daily. With food   atomoxetine 40 MG capsule Commonly known as: STRATTERA Take 40 mg by mouth daily.   blood glucose meter kit and supplies Kit Dispense based on patient and insurance preference. Use up to four times daily as directed. (FOR ICD-9 250.00, 250.01).   Blood Pressure Kit Check blood pressure once a day till evaluated by PCP   calcitRIOL 0.25 MCG capsule Commonly known as: ROCALTROL Take 1 capsule (0.25 mcg total) by mouth Every Tuesday,Thursday,and Saturday with dialysis. Start taking on: April 09, 2019   calcium acetate 667 MG capsule Commonly known as: PHOSLO Take 1 capsule (667 mg total) by mouth 3 (three) times daily with meals.   clopidogrel 75 MG tablet Commonly known as: PLAVIX Take 1 tablet (75 mg total) by mouth daily.   escitalopram 10 MG tablet Commonly known as: LEXAPRO Take 1 tablet (10 mg total) by mouth daily.   gabapentin 100 MG capsule Commonly known as: NEURONTIN Take 1 capsule (100 mg total) by mouth 3 (three) times daily.   Insulin Pen Needle 31G X 5 MM Misc Humalog 3 units 3 times a day with meals   latanoprost 0.005 % ophthalmic solution Commonly known as: XALATAN Place 1 drop into both eyes at bedtime.   lidocaine 5 % Commonly known as: LIDODERM Place 1 patch onto the skin daily. Remove & Discard patch within 12 hours or as directed by MD Start taking on: April 09, 2019   lidocaine-prilocaine cream Commonly known as: EMLA Apply 1 application topically as needed. 1 hour before dialysis   methocarbamol 500 MG tablet Commonly known as: ROBAXIN Take 1 tablet (500 mg total) by mouth every 6 (six) hours as needed for muscle spasms (back pain).   multivitamin Tabs tablet Take 1 tablet by mouth at bedtime.   NovoLOG FlexPen 100 UNIT/ML FlexPen Generic drug: insulin aspart Inject 3 Units into the skin 3 (three) times daily with  meals.   ondansetron 4 MG tablet Commonly known as: ZOFRAN Take 1 tablet (4 mg total) by mouth every 6 (six) hours as needed for nausea.   pantoprazole 40 MG tablet Commonly known as: Protonix Take 1 tablet (40 mg total) by mouth daily.   QUEtiapine 100 MG tablet Commonly known as: SEROQUEL Take 1 tablet (100 mg total) by mouth at bedtime.   sevelamer carbonate 800 MG tablet Commonly known as: RENVELA Take 3 tablets (2,400 mg total) by mouth 3 (three) times daily with meals.   sodium zirconium cyclosilicate 10 g Pack packet Commonly known as: LOKELMA Take 10 g by mouth daily. Start taking on: April 09, 2019   traMADol 50 MG tablet Commonly known as: ULTRAM Take 1 tablet (50 mg total) by mouth every 8 (eight) hours as needed for moderate pain.   traZODone 50 MG tablet Commonly known as: DESYREL Take 1 tablet (50 mg total) by mouth at bedtime as needed for sleep.      Follow-up Information    Kathrynn Humble Brennan Bailey, [name]. Schedule an appointment as soon as possible for a visit in 1 week(s).   Specialty: Internal Medicine Why: with cbc/bmp Contact information: [address] [place] [address] [phone]          Allergies  Allergen Reactions  . Adhesive [Tape] Other (See Comments)    "RIPS MY SKIN," SO PLEASE USE COBAN WRAP!!  . Other Rash    "RIPS MY SKIN," SO PLEASE USE COBAN WRAP!! "RIPS MY SKIN," SO PLEASE USE COBAN WRAP!!  . Hydrocodone-Acetaminophen Other (See Comments)    "Makes me feel crazy"  . Metformin And Related Other (See Comments)    "Makes me feel crazy"    Consultations:  Nephrology   Procedures/Studies: Dg Chest 2 View  Result Date: 03/09/2019 CLINICAL DATA:  Chest pain and shortness of breath. EXAM: CHEST - 2 VIEW COMPARISON:  04/17/2018 FINDINGS: The cardio pericardial silhouette is enlarged. Diffuse interstitial opacity again noted. No focal airspace consolidation. No pleural effusion The visualized bony  structures of the thorax are intact. IMPRESSION: Cardiomegaly with diffuse interstitial opacities suggesting edema. Electronically Signed   By: Misty Stanley M.D.   On: 03/09/2019 20:13   Dg Abdomen 1 View  Result Date: 04/06/2019 CLINICAL DATA:  53 year old female with nausea vomiting. EXAM: ABDOMEN - 1 VIEW COMPARISON:  CT of the abdomen pelvis dated 04/06/2019. FINDINGS: Evaluation is limited due to body habitus and soft tissue attenuation. There is no bowel dilatation or evidence of obstruction. No free air identified. Multiple echogenic structures in the right flank correspond to the gallstones. There is vascular calcification. An IVC filter is noted. The osseous structures are intact. IMPRESSION: 1. No bowel obstruction. 2. Cholelithiasis. 3. Vascular calcification. Electronically Signed   By: Anner Crete M.D.   On: 04/06/2019 16:07   Ct Abdomen Pelvis W Contrast  Result Date: 04/06/2019 CLINICAL DATA:  Abdominal pain. Left-sided abdominal pain EXAM: CT ABDOMEN AND PELVIS WITH CONTRAST TECHNIQUE: Multidetector CT imaging of the abdomen and pelvis was performed using the standard protocol following bolus administration of intravenous contrast. CONTRAST:  145m OMNIPAQUE IOHEXOL 300 MG/ML  SOLN COMPARISON:  April 05, 2019 FINDINGS: Lower chest: The lung bases are clear. The heart size is normal. Hepatobiliary: The liver is normal. Cholelithiasis without acute inflammation.There is no biliary ductal dilation. Pancreas: There are multiple small cystic lesions in the pancreatic body, some of which are stable from prior study in 2019, while others appear new. Spleen: No splenic laceration or hematoma. Adrenals/Urinary Tract: --Adrenal glands: No adrenal hemorrhage. --Right kidney/ureter: No hydronephrosis or perinephric hematoma. --Left kidney/ureter: No hydronephrosis or perinephric hematoma. --Urinary bladder: Unremarkable. Stomach/Bowel: --Stomach/Duodenum: There is wall thickening of the distal  esophagus. --Small bowel: No dilatation or inflammation. --Colon: No focal abnormality. --Appendix: Not visualized. No right lower quadrant inflammation or free fluid. Vascular/Lymphatic: Atherosclerotic calcification is present within the non-aneurysmal abdominal aorta, without hemodynamically significant stenosis. There is an IVC filter in place --there mild enlarged retroperitoneal lymph nodes. --No mesenteric lymphadenopathy. --there is few prominent pelvic lymph nodes. Reproductive: There is a fibroid uterus. Other: No ascites or free air. The abdominal wall is normal. Musculoskeletal. No acute displaced fractures. IMPRESSION: 1. No acute intra-abdominal or intrapelvic process. 2. Multiple small cystic lesions in the pancreatic body, some of which are stable from prior study in 2019, while others appear new. These could be further evaluated with MRI of the abdomen with and without contrast on a nonemergent basis. 3. Cholelithiasis without acute inflammation. 4. Aortic atherosclerosis. 5. There is wall thickening of the distal esophagus. Correlate for symptoms of esophagitis. 6. Fibroid uterus. Aortic Atherosclerosis (ICD10-I70.0). Electronically Signed   By: CJamie KatoD.  On: 04/06/2019 05:05   Ct Renal Stone Study  Result Date: 04/05/2019 CLINICAL DATA:  Left side abdominal pain, nausea, vomiting, left flank pain EXAM: CT ABDOMEN AND PELVIS WITHOUT CONTRAST TECHNIQUE: Multidetector CT imaging of the abdomen and pelvis was performed following the standard protocol without IV contrast. COMPARISON:  12/23/2017 FINDINGS: Lower chest: Cardiomegaly. Dense calcifications in the visualized right coronary artery. No acute abnormality. Hepatobiliary: Layering gallstones within the gallbladder. No focal hepatic abnormality. Pancreas: No focal abnormality or ductal dilatation. Spleen: No focal abnormality.  Normal size. Adrenals/Urinary Tract: No adrenal abnormality. No focal renal abnormality. No stones  or hydronephrosis. Urinary bladder is unremarkable. Stomach/Bowel: Stomach, large and small bowel grossly unremarkable. Vascular/Lymphatic: Aortic and branch vessel atherosclerosis. No aneurysm or adenopathy. IVC filter noted. Reproductive: Calcifications in the uterus compatible with fibroids. No adnexal mass. Other: No free fluid or free air. Musculoskeletal: No acute bony abnormality. IMPRESSION: No evidence of renal or ureteral stones.  No hydronephrosis. Coronary artery disease. Aortic atherosclerosis and branch vessel atherosclerosis. Cholelithiasis. No acute findings in the abdomen or pelvis. Electronically Signed   By: Rolm Baptise M.D.   On: 04/05/2019 22:04       Subjective: Patient seen and examined at bedside.  She is more awake and answers some questions appropriately.  Feels better and is okay to be discharged today.  Discharge Exam: Vitals:   04/08/19 0550 04/08/19 0900  BP: 135/73 98/62  Pulse: 83 79  Resp:    Temp: 98.3 F (36.8 C) 98.6 F (37 C)  SpO2: 100% 100%    General: Pt is alert, awake, not in acute distress Cardiovascular: rate controlled, S1/S2 + Respiratory: bilateral decreased breath sounds at bases Abdominal: Soft, NT, ND, bowel sounds + Extremities: no edema, no cyanosis.  Left BKA present    The results of significant diagnostics from this hospitalization (including imaging, microbiology, ancillary and laboratory) are listed below for reference.     Microbiology: Recent Results (from the past 240 hour(s))  SARS CORONAVIRUS 2 (TAT 6-24 HRS) Nasopharyngeal Nasopharyngeal Swab     Status: None   Collection Time: 04/06/19  6:33 AM   Specimen: Nasopharyngeal Swab  Result Value Ref Range Status   SARS Coronavirus 2 NEGATIVE NEGATIVE Final    Comment: (NOTE) SARS-CoV-2 target nucleic acids are NOT DETECTED. The SARS-CoV-2 RNA is generally detectable in upper and lower respiratory specimens during the acute phase of infection. Negative results do not  preclude SARS-CoV-2 infection, do not rule out co-infections with other pathogens, and should not be used as the sole basis for treatment or other patient management decisions. Negative results must be combined with clinical observations, patient history, and epidemiological information. The expected result is Negative. Fact Sheet for Patients: SugarRoll.be Fact Sheet for Healthcare Providers: https://www.woods-mathews.com/ This test is not yet approved or cleared by the [place] FDA and  has been authorized for detection and/or diagnosis of SARS-CoV-2 by FDA under an Emergency Use Authorization (EUA). This EUA will remain  in effect (meaning this test can be used) for the duration of the COVID-19 declaration under Section 56 4(b)(1) of the Act, 21 U.S.C. section 360bbb-3(b)(1), unless the authorization is terminated or revoked sooner. Performed at [facility], [place] [address] [place], [place] [address]      Labs: BNP (last 3 results) Recent Labs    04/17/18 1308  BNP 765.4*   Basic Metabolic Panel: Recent Labs  Lab 04/05/19 1509 04/06/19 1107 04/07/19 0604 04/08/19 0210  NA 140 140 138  137  K 4.4 5.7* 7.1* 4.1  CL 96* 96* 93* 96*  CO2 23 19* 23 27  GLUCOSE 245* 295* 191* 71  BUN 101* 128* 141* 44*  CREATININE 8.91* 10.71* 12.09* 6.33*  CALCIUM 7.9* 8.0* 8.2* 8.3*   Liver Function Tests: Recent Labs  Lab 04/05/19 1509 04/06/19 1107 04/08/19 0210  AST 12* 10* 13*  ALT 18 14 18   ALKPHOS 96 81 74  BILITOT 0.6 0.8 0.7  PROT 7.3 6.6 6.2*  ALBUMIN 3.5 3.3* 2.8*   Recent Labs  Lab 04/05/19 1509 04/06/19 1107  LIPASE 101* 71*  AMYLASE  --  177*   No results for input(s): AMMONIA in the last 168 hours. CBC: Recent Labs  Lab 04/05/19 1509 04/06/19 1107 04/07/19 0604 04/08/19 0210  WBC 7.5 6.6 4.6 3.9*  NEUTROABS  --   --   --  1.7  HGB 10.4* 9.8* 8.1* 9.5*  HCT 33.4* 32.7* 25.0* 31.1*  MCV 75.4*  77.7* 73.7* 76.4*  PLT 213 192 126* 213   Cardiac Enzymes: Recent Labs  Lab 04/06/19 1107  CKTOTAL 124   BNP: Invalid input(s): POCBNP CBG: Recent Labs  Lab 04/07/19 2148 04/08/19 0413 04/08/19 0449 04/08/19 0450 04/08/19 0810  GLUCAP 139* 40* 174* 175* 165*   D-Dimer No results for input(s): DDIMER in the last 72 hours. Hgb A1c Recent Labs    04/06/19 1107  HGBA1C 9.6*   Lipid Profile No results for input(s): CHOL, HDL, LDLCALC, TRIG, CHOLHDL, LDLDIRECT in the last 72 hours. Thyroid function studies No results for input(s): TSH, T4TOTAL, T3FREE, THYROIDAB in the last 72 hours.  Invalid input(s): FREET3 Anemia work up No results for input(s): VITAMINB12, FOLATE, FERRITIN, TIBC, IRON, RETICCTPCT in the last 72 hours. Urinalysis    Component Value Date/Time   COLORURINE YELLOW 01/08/2018 1132   APPEARANCEUR HAZY (A) 01/08/2018 1132   LABSPEC 1.011 01/08/2018 1132   PHURINE 8.0 01/08/2018 1132   GLUCOSEU >=500 (A) 01/08/2018 1132   HGBUR SMALL (A) 01/08/2018 1132   BILIRUBINUR NEGATIVE 01/08/2018 1132   KETONESUR NEGATIVE 01/08/2018 1132   PROTEINUR >=300 (A) 01/08/2018 1132   NITRITE NEGATIVE 01/08/2018 1132   LEUKOCYTESUR NEGATIVE 01/08/2018 1132   Sepsis Labs Invalid input(s): PROCALCITONIN,  WBC,  LACTICIDVEN Microbiology Recent Results (from the past 240 hour(s))  SARS CORONAVIRUS 2 (TAT 6-24 HRS) Nasopharyngeal Nasopharyngeal Swab     Status: None   Collection Time: 04/06/19  6:33 AM   Specimen: Nasopharyngeal Swab  Result Value Ref Range Status   SARS Coronavirus 2 NEGATIVE NEGATIVE Final    Comment: (NOTE) SARS-CoV-2 target nucleic acids are NOT DETECTED. The SARS-CoV-2 RNA is generally detectable in upper and lower respiratory specimens during the acute phase of infection. Negative results do not preclude SARS-CoV-2 infection, do not rule out co-infections with other pathogens, and should not be used as the sole basis for treatment or other  patient management decisions. Negative results must be combined with clinical observations, patient history, and epidemiological information. The expected result is Negative. Fact Sheet for Patients: SugarRoll.be Fact Sheet for Healthcare Providers: https://www.woods-mathews.com/ This test is not yet approved or cleared by the [place] FDA and  has been authorized for detection and/or diagnosis of SARS-CoV-2 by FDA under an Emergency Use Authorization (EUA). This EUA will remain  in effect (meaning this test can be used) for the duration of the COVID-19 declaration under Section 56 4(b)(1) of the Act, 21 U.S.C. section 360bbb-3(b)(1), unless the authorization is terminated or revoked sooner.  Performed at [facility], [place] [address] [place], [place] [address]      Time coordinating discharge: 35 minutes  SIGNED:   Aline August, MD  [facility] 04/08/2019, 11:55 AM

## 2019-04-08 NOTE — TOC Initial Note (Addendum)
Transition of Care [facility]) - Initial/Assessment Note    Patient Details  Name: Lauren Warner MRN: [id number] Date of Birth: 11-07-1965  [facility]) CM/SW Contact:    Lauren Halsted, LCSW Phone Number: 04/08/2019, 9:02 AM  Clinical Narrative:                 CSW spoke with Lauren Warner from [facility]. She reported that they had extended patient's hotel stay until 11/9, but that patient had housing secured so they would not be extending her contract at this time. The only thing they were assisting with is to help patient get furniture through [facility] ($35 referral fee and $75 delivery fee that they were not able to assist with). Patient had secured funds through Lauren Warner (Lauren Warner [phone])) and [facility] for housing. She was getting assistance from [facility] and [facility] for dialysis transportation. She receives disability. CSW to speak with patient regarding her discharge plans.     Barriers to Discharge: Continued Medical Work up   Patient Goals and CMS Choice        Expected Discharge Plan and Services   In-house Referral: Clinical Social Work Discharge Planning Services: CM Consult   Living arrangements for the past 2 months: [name], Hotel/Motel                             [name] Agency: Lauren Warner ([facility]ren [facility])        Prior Living Arrangements/Services Living arrangements for the past 2 months: [place], Hotel/Motel Lives with:: Self Patient language and need for interpreter reviewed:: Yes        Need for Family Participation in Patient Care: No (Comment) Care giver support system in place?: No (comment) Current home services: Home RN, Home PT Criminal Activity/Legal Involvement Pertinent to Current Situation/Hospitalization: No - Comment as needed  Activities of Daily Living      Permission Sought/Granted                  Emotional Assessment Appearance::  Appears stated age     Orientation: : Oriented to Self, Oriented to Place, Oriented to  Time, Oriented to Situation Alcohol / Substance Use: Not Warner Psych Involvement: No (comment)  Admission diagnosis:  Renovascular hypertension [I15.0] Intractable pain [R52] Intractable nausea and vomiting [R11.2] Abdominal pain [R10.9] Patient Active Problem List   Diagnosis Date Noted  . Abdominal pain 04/06/2019  . Left below-knee amputee ([place]) 03/24/2019  . Major depressive disorder, recurrent episode, moderate ([place]) 03/11/2019  . Acute on chronic diastolic (congestive) heart failure ([place]) 03/10/2019  . Encephalopathy, metabolic 123456  . Acute on chronic congestive heart failure ([place])   . Noncompliance with renal dialysis ([place])   . MDD (major depressive disorder), single episode, severe , no psychosis ([place])   . SOB (shortness of breath) 01/22/2018  . Acute encephalopathy 01/07/2018  . Suicidal ideation 01/07/2018  . Hypertensive encephalopathy 12/23/2017  . Hypertensive urgency 12/22/2017  . Syncope and collapse 12/03/2017  . Hyperkalemia 11/10/2017  . Pressure injury of skin 10/20/2017  . Essential hypertension 10/19/2017  . CAD (coronary artery disease) 10/19/2017  . ESRD on dialysis ([place]) 10/19/2017  . Syncope 10/19/2017  . Anemia due to end stage renal disease ([place]) 10/19/2017  . Closed left ankle fracture 10/19/2017  . Nausea vomiting and diarrhea 10/19/2017  . DM (diabetes mellitus), type 2 with renal complications ([place]) 123456  . Acute respiratory  failure with hypoxia ([place]) 10/18/2017   PCP:  [name], [name] Pharmacy:   [place], [place] [address] [place] [address] Phone: [phone] Fax: [place], [place] [address] Phone: [phone] Fax:  [place], [place] [address] [place] [address] Phone: [phone] Fax: [phone]     Social Determinants of Health (SDOH) Interventions    Readmission Risk Interventions Readmission Risk Prevention Plan 04/08/2019  Transportation Screening Complete  Medication Review ([place]) Complete  PCP or Specialist appointment within 3-5 days of discharge Complete  HRI or [place] Complete  SW Recovery Care/Counseling Consult Complete  [place] Not Warner

## 2019-04-08 NOTE — Progress Notes (Addendum)
Patient stated, "I think my blood sugar is low, and It's hard to see out of my right eye." Blood sugar of 40. Patient ate 2 cups of pudding, and drank cranberry juice. Received 1 amp of D50. Patient ate 1/2 a sandwich. Blood sugar recheck of 174. Patient states "my vision is much better". Jeannette Corpus, on call provider paged. Will continue to monitor.

## 2019-04-08 NOTE — TOC Progression Note (Addendum)
Transition of Care [facility]) - Progression Note    Patient Details  Name: Lauren Warner MRN: [id number] Date of Birth: 01-Oct-1965  Transition of Care [facility]) CM/SW [place], LCSW Phone Number: 04/08/2019, 12:50 PM  Clinical Narrative:    CSW and CSW Intern spoke with patient. She reported that she has a house available ([address]) that she has been trying to rent from someone named Oval Linsey, however, she has been working with the [name] to get money to afford to rent the house. She says she spoke with Ms. Moore who requested she have the lights and water turned on at the house, which the patient did (from the $400 she received from [name]), but Ms. Laurance Flatten has not let patient know if she has been accepted into their program to get the funds. Patient requested CSW contact her to check on the status. CSW left a voicemail. Patient reports her wheelchair should be at her dialysis center and CSW encouraged her to call them to make sure. She states her stuff is still at the hotel that she came from. She also reports that she has secured Paediatric nurse (washer/dryer). At this point, patient does not have a key to get into the house and seems uncertain if the house is even still available. CSW left voicemail for [place] with [employer] to see if patient is able to return to the hotel to get her stuff. Patient states that she will not go into a skilled nursing facility or live on the street.     Barriers to Discharge: Homeless with medical needs  Expected Discharge Plan and Services   In-house Referral: Clinical Social Work Discharge Planning Services: CM Consult Post Acute Care Choice: Home Health Living arrangements for the past 2 months: [place], Hotel/Motel Expected Discharge Date: 04/08/19                         HH Arranged: PT, RN [place] Agency: [place] ([facility]) Date HH Agency Contacted: 04/08/19 Time  [place]: [id number] Representative spoke with at [place]: [place] ([place]) Interventions    Readmission Risk Interventions Readmission Risk Prevention Plan 04/08/2019  Transportation Screening Complete  Medication Review Press photographer) Complete  PCP or Specialist appointment within 3-5 days of discharge Complete  HRI or [place] Complete  SW Recovery Care/Counseling Consult Complete  [place] Not Applicable

## 2019-04-08 NOTE — Progress Notes (Signed)
Patient is more alert this morning than last night. Patient current pain level is 4/10, in her mid back. Patient states that her her pain on her left side and back is related to stress. Patient states she gets excruciating back pain when overwhelmed and stressed. Patient states she has gone through a lot of stressful times in the last year (daughter passed away in 10/04/2022, Covid x2, AKA 2 months ago, separation from husband 4 months ago). Patient currently livings in motel [address] through [employer]. She states she is unsure of her enrollment in [facility]. Social care worker consult ordered.

## 2019-04-08 NOTE — Care Management Important Message (Signed)
Important Message  Patient Details  Name: Lauren Warner MRN: [id number] Date of Birth: July 31, 1965   Medicare Important Message Given:  Yes     [name] Montine Circle 04/08/2019, 4:19 PM

## 2019-04-08 NOTE — Progress Notes (Signed)
Physical Therapy Evaluation Patient Details Name: Lauren Warner MRN: [id number] DOB: 1965-11-23 Today's Date: 04/08/2019   History of Present Illness  53 y.o. female with recent history of admission for missed HD, recent L AK amputation and loss of her child April 2020, was admitted for N&V, resolved with clear covid test and has cholelithiasis.  Has stressful housing insecurity issues, issues with agitation and on HD.  PMHx:  ESRD, L AK, HTN, DM, PE, CAD, anxiety, chest pain, anemia, covid infection x 2 leading up to AK amp.  Clinical Impression  Pt was seen for mobility and test of safety with transfers, balance and control of standing with no AD but using support surfaces and other people.  Pt is expecting to go to a house that was in the works for her but is not clear if the agency making the arrangements has set this up for her.  Pt will be seen acutely for moving to transfer, to take steps if able and to instruct safety on RW.  Her default plan is going to be SNF if her home situation is not finalized.    Follow Up Recommendations Home health PT;Supervision for mobility/OOB(HH aide)    Equipment Recommendations  (had WC, toilet riser and  tub bench )    Recommendations for Other Services Rehab consult     Precautions / Restrictions Precautions Precautions: Fall Precaution Comments: L AKA  Other Brace: shrinker for L AKA Restrictions Weight Bearing Restrictions: No      Mobility  Bed Mobility Overal bed mobility: Modified Independent             General bed mobility comments: bed to wheelchair  Transfers Overall transfer level: Needs assistance Equipment used: Rolling walker (2 wheeled) Transfers: Sit to/from [facility] Sit to Stand: Min guard Stand pivot transfers: Min guard          Ambulation/Gait             General Gait Details: not ambulatory yet  Stairs            Wheelchair Mobility    Modified Rankin (Stroke Patients  Only)       Balance   Sitting-balance support: No upper extremity supported Sitting balance-Leahy Scale: Good     Standing balance support: Bilateral upper extremity supported Standing balance-Leahy Scale: Poor                               Pertinent Vitals/Pain Pain Assessment: Faces Faces Pain Scale: Hurts little more Pain Location: L low back Pain Descriptors / Indicators: Tightness;Tender Pain Intervention(s): Repositioned;Limited activity within patient's tolerance;Monitored during session    [place] expects to be discharged to:: Private residence Living Arrangements: Alone Available Help at Discharge: Available PRN/intermittently;Family;Friend(s) Type of Home: House Home Access: Ramped entrance     Home Layout: One level Home Equipment: Wheelchair - manual      Prior Function Level of Independence: Independent with assistive device(s)         Comments: wc for mobility prior to this admission     Hand Dominance   Dominant Hand: Right    Extremity/Trunk Assessment   Upper Extremity Assessment Upper Extremity Assessment: Overall WFL for tasks assessed    Lower Extremity Assessment Lower Extremity Assessment: LLE deficits/detail LLE Deficits / Details: L aka, hip ROM WFL LLE Coordination: decreased gross motor    Cervical / Trunk Assessment Cervical / Trunk Assessment: Normal  Communication   Communication: No difficulties  Cognition Arousal/Alertness: Awake/alert Behavior During Therapy: Agitated;WFL for tasks assessed/performed Overall Cognitive Status: No family/caregiver present to determine baseline cognitive functioning Area of Impairment: Problem solving;Memory                     Memory: Decreased recall of precautions;Decreased short-term memory   Safety/Judgement: Decreased awareness of safety;Decreased awareness of deficits Awareness: Emergent Problem Solving: Requires verbal cues;Requires  tactile cues        General Comments General comments (skin integrity, edema, etc.): pt is surprisingly good with mobility as she can control standing balance with hha now    Exercises     Assessment/Plan    PT Assessment Patient needs continued PT services  PT Problem List Decreased strength;Decreased range of motion;Decreased activity tolerance;Decreased balance;Decreased mobility;Decreased coordination;Decreased knowledge of use of DME;Decreased safety awareness;Cardiopulmonary status limiting activity;Decreased skin integrity;Pain       PT Treatment Interventions DME instruction;Functional mobility training;Therapeutic activities;Therapeutic exercise;Balance training;Neuromuscular re-education;Patient/family education    PT Goals (Current goals can be found in the Care Plan section)  Acute Rehab PT Goals Patient Stated Goal: home with her aide PT Goal Formulation: With patient Time For Goal Achievement: 04/15/19 Potential to Achieve Goals: Good    Frequency Min 3X/week   Barriers to discharge Decreased caregiver support home is not yet determined    Co-evaluation               AM-PAC PT "6 Clicks" Mobility  Outcome Measure Help needed turning from your back to your side while in a flat bed without using bedrails?: A Little Help needed moving from lying on your back to sitting on the side of a flat bed without using bedrails?: A Little Help needed moving to and from a bed to a chair (including a wheelchair)?: A Little Help needed standing up from a chair using your arms (e.g., wheelchair or bedside chair)?: A Little Help needed to walk in hospital room?: A Lot Help needed climbing 3-5 steps with a railing? : A Lot 6 Click Score: 16    End of Session Equipment Utilized During Treatment: Gait belt Activity Tolerance: Patient tolerated treatment well;Patient limited by fatigue;Patient limited by lethargy Patient left: with call bell/phone within reach;in chair    PT Visit Diagnosis: Unsteadiness on feet (R26.81);Muscle weakness (generalized) (M62.81);Difficulty in walking, not elsewhere classified (R26.2)    Time: 1300-1340 PT Time Calculation (min) (ACUTE ONLY): 40 min   Charges:   PT Evaluation $PT Eval Moderate Complexity: 1 Mod PT Treatments $Therapeutic Activity: 8-22 mins       Ramond Dial 04/08/2019, 2:47 PM   Mee Hives, PT MS Acute Rehab Dept. Number: [place] and [place]

## 2019-04-08 NOTE — Progress Notes (Signed)
Inpatient Diabetes Program Recommendations  AACE/ADA: New Consensus Statement on Inpatient Glycemic Control (2015)  Target Ranges:  Prepandial:   less than 140 mg/dL      [place] postprandial:   less than 180 mg/dL (1-2 hours)      Critically ill patients:  140 - 180 mg/dL   Lab Results  Component Value Date   GLUCAP 165 (H) 04/08/2019   HGBA1C 9.6 (H) 04/06/2019    Review of Glycemic Control Results for Lauren Warner, Lauren Warner (MRN [id number]) as of 04/08/2019 09:45  Ref. Range 04/08/2019 04:13 04/08/2019 04:49 04/08/2019 04:50 04/08/2019 08:10  Glucose-Capillary Latest Ref Range: 70 - 99 mg/dL 40 (LL) 174 (H) 175 (H) 165 (H)   Diabetes history: Type 2 DM Outpatient Diabetes medications: Humalog 3 units TID Current orders for Inpatient glycemic control: Novolog 0-9 units TID, Novolog 0-5 units QHS, Levemir 8 units QHS  Inpatient Diabetes Program Recommendations:    Noted hypoglycemia this AM of 40 mg/dL. Would consider decreasing Levemir to 4 units QHS.  Secure chat sent to MD.  Thanks, Bronson Curb, MSN, RNC-OB Diabetes Coordinator [phone] (8a-5p)

## 2019-04-08 NOTE — Progress Notes (Signed)
PT Cancellation Note  Patient Details Name: Lauren Warner MRN: [id number] DOB: February 03, 1966   Cancelled Treatment:    Reason Eval/Treat Not Completed: Pain limiting ability to participate.  Pt was unable to do any therapy, has pain complaints and reported she had not asked for meds yet.  Contacted nursing to get meds.  Will retry at another time.   Ramond Dial 04/08/2019, 8:57 AM   Mee Hives, PT MS Acute Rehab Dept. Number: [place] and [place]

## 2019-04-08 NOTE — TOC Transition Note (Signed)
Transition of Care [facility]) - CM/SW Discharge Note   Patient Details  Name: Lauren Warner MRN: [id number] Date of Birth: 10-23-1965  Transition of Care [facility]) CM/SW Contact:  [name], LCSW Phone Number: 04/08/2019, 4:23 PM   Clinical Narrative:    CSW spoke with Sherrie with [employer]. She emailed CSW a form for patient to complete. Patient completed form and CSW emailed it back to [employer]. She reported that patient was sent this form on Tuesday to complete and that is why the application process for funds has not been completed yet. CSW spoke with [name] with [employer]. She reported that patient is able to return to the hotel today ([place]) through Monday. CSW provided taxi voucher for patient to go to [facility] to pick up her wheelchair (CSW confirmed with dialysis that they will be there until 5:30pm) and then for the taxi to take her to the hotel. Patient stated understanding. CSW used petty cash [phone]) to send patient's medications through [place] and have been delivered to her room.   Patient will DC to: [address] Anticipated DC date: 04/08/19 Family notified: Patient refused (though I heard her on the phone with someone who called her "mom") Transport by: [employer] taxi by [facility] will sign off for now as social work intervention is no longer needed. Please consult [place] again if new needs arise.  Cedric Fishman, LCSW Clinical Social Worker [phone]      Barriers to Discharge: Homeless with medical needs   Patient Goals and CMS Choice Patient states their goals for this hospitalization and ongoing recovery are:: To live in her own home CMS Medicare.gov Compare Post Acute Care list provided to:: Patient Choice offered to / list presented to : Patient  Discharge Placement                       Discharge Plan and Services In-house Referral: Clinical Social Work Discharge Planning Services: CM Consult Post Acute Care  Choice: Home Health                    HH Arranged: PT, RN [facility] Agency: [place] ([facility]) Date HH Agency Contacted: 04/08/19 Time [place]: [id number] Representative spoke with at [place]: [place] ([place]) Interventions     Readmission Risk Interventions Readmission Risk Prevention Plan 04/08/2019  Transportation Screening Complete  Medication Review Press photographer) Complete  PCP or Specialist appointment within 3-5 days of discharge Complete  HRI or [place] Complete  SW Recovery Care/Counseling Consult Complete  [place] Not Applicable

## 2019-04-08 NOTE — Plan of Care (Signed)

## 2019-04-08 NOTE — Progress Notes (Signed)
Lauren Warner to be D/C'd to [address] per MD order.  Discussed with the patient and all questions fully answered.  VSS, Skin clean, dry and intact without evidence of skin break down, no evidence of skin tears noted. IV catheter discontinued intact. Site without signs and symptoms of complications. Dressing and pressure applied.  An After Visit Summary was printed and given to the patient. Patient received prescription.  D/c education completed with patient/family including follow up instructions, medication list, d/c activities limitations if indicated, with other d/c instructions as indicated by MD - patient able to verbalize understanding, all questions fully answered.   Patient instructed to return to ED, call 911, or call MD for any changes in condition.   Patient escorted via [place], and D/C motel [address] via private auto. Will also stop by the dialysis center to pick up wheelchair.   Dene Gentry 04/08/2019 5:04 PM

## 2019-04-09 ENCOUNTER — Telehealth: Payer: Self-pay | Admitting: Nephrology

## 2019-04-09 NOTE — Telephone Encounter (Signed)
Date of discharge: 04/08/19 Date of contact: 04/09/19 Spoke with patient  Patient contacted to discuss transition of care from recent inpatient hospitalization. Patient was admitted to [facility] 11/3-11/6/20 with discharge diagnosis of flank pain, nausea, vomiting.  Discharge summary reviewed. Medication changes reviewed.  She did not go to her outpatient dialysis today because of back pain. She does have transportation to dialysis via [facility] and knows to call to set up transportation.  Has some concerns about needing glucose meter. Encouraged to schedule follow up visit with primary care provider. Also discussed importance of keeping all dialysis appointments.

## 2019-04-11 ENCOUNTER — Encounter (HOSPITAL_COMMUNITY): Payer: Self-pay

## 2019-04-11 ENCOUNTER — Inpatient Hospital Stay (HOSPITAL_COMMUNITY)
Admission: EM | Admit: 2019-04-11 | Discharge: 2019-04-15 | DRG: 100 | Disposition: A | Discharge status: Home Health | Payer: Medicare Other | Attending: Internal Medicine | Admitting: Internal Medicine

## 2019-04-11 DIAGNOSIS — Z7982 Long term (current) use of aspirin: Secondary | ICD-10-CM

## 2019-04-11 DIAGNOSIS — F329 Major depressive disorder, single episode, unspecified: Secondary | ICD-10-CM | POA: Diagnosis present

## 2019-04-11 DIAGNOSIS — Z79899 Other long term (current) drug therapy: Secondary | ICD-10-CM

## 2019-04-11 DIAGNOSIS — Z794 Long term (current) use of insulin: Secondary | ICD-10-CM

## 2019-04-11 DIAGNOSIS — Z20828 Contact with and (suspected) exposure to other viral communicable diseases: Secondary | ICD-10-CM | POA: Diagnosis present

## 2019-04-11 DIAGNOSIS — E785 Hyperlipidemia, unspecified: Secondary | ICD-10-CM | POA: Diagnosis present

## 2019-04-11 DIAGNOSIS — M549 Dorsalgia, unspecified: Secondary | ICD-10-CM | POA: Diagnosis not present

## 2019-04-11 DIAGNOSIS — E8889 Other specified metabolic disorders: Secondary | ICD-10-CM | POA: Diagnosis present

## 2019-04-11 DIAGNOSIS — Z885 Allergy status to narcotic agent status: Secondary | ICD-10-CM

## 2019-04-11 DIAGNOSIS — Z91048 Other nonmedicinal substance allergy status: Secondary | ICD-10-CM

## 2019-04-11 DIAGNOSIS — I5032 Chronic diastolic (congestive) heart failure: Secondary | ICD-10-CM | POA: Diagnosis present

## 2019-04-11 DIAGNOSIS — L089 Local infection of the skin and subcutaneous tissue, unspecified: Secondary | ICD-10-CM | POA: Diagnosis present

## 2019-04-11 DIAGNOSIS — M62830 Muscle spasm of back: Secondary | ICD-10-CM | POA: Diagnosis present

## 2019-04-11 DIAGNOSIS — G40409 Other generalized epilepsy and epileptic syndromes, not intractable, without status epilepticus: Secondary | ICD-10-CM | POA: Diagnosis not present

## 2019-04-11 DIAGNOSIS — I2510 Atherosclerotic heart disease of native coronary artery without angina pectoris: Secondary | ICD-10-CM | POA: Diagnosis present

## 2019-04-11 DIAGNOSIS — Z89512 Acquired absence of left leg below knee: Secondary | ICD-10-CM

## 2019-04-11 DIAGNOSIS — Z955 Presence of coronary angioplasty implant and graft: Secondary | ICD-10-CM

## 2019-04-11 DIAGNOSIS — Z86711 Personal history of pulmonary embolism: Secondary | ICD-10-CM

## 2019-04-11 DIAGNOSIS — N186 End stage renal disease: Secondary | ICD-10-CM | POA: Diagnosis present

## 2019-04-11 DIAGNOSIS — J45909 Unspecified asthma, uncomplicated: Secondary | ICD-10-CM | POA: Diagnosis present

## 2019-04-11 DIAGNOSIS — R000 Tachycardia, unspecified: Secondary | ICD-10-CM | POA: Diagnosis present

## 2019-04-11 DIAGNOSIS — E875 Hyperkalemia: Secondary | ICD-10-CM | POA: Diagnosis present

## 2019-04-11 DIAGNOSIS — Z888 Allergy status to other drugs, medicaments and biological substances status: Secondary | ICD-10-CM

## 2019-04-11 DIAGNOSIS — F419 Anxiety disorder, unspecified: Secondary | ICD-10-CM | POA: Diagnosis present

## 2019-04-11 DIAGNOSIS — Z7902 Long term (current) use of antithrombotics/antiplatelets: Secondary | ICD-10-CM

## 2019-04-11 DIAGNOSIS — E1022 Type 1 diabetes mellitus with diabetic chronic kidney disease: Secondary | ICD-10-CM | POA: Diagnosis present

## 2019-04-11 DIAGNOSIS — R944 Abnormal results of kidney function studies: Secondary | ICD-10-CM | POA: Diagnosis present

## 2019-04-11 DIAGNOSIS — E7800 Pure hypercholesterolemia, unspecified: Secondary | ICD-10-CM | POA: Diagnosis present

## 2019-04-11 DIAGNOSIS — Z9981 Dependence on supplemental oxygen: Secondary | ICD-10-CM

## 2019-04-11 DIAGNOSIS — I132 Hypertensive heart and chronic kidney disease with heart failure and with stage 5 chronic kidney disease, or end stage renal disease: Secondary | ICD-10-CM | POA: Diagnosis present

## 2019-04-11 DIAGNOSIS — D631 Anemia in chronic kidney disease: Secondary | ICD-10-CM | POA: Diagnosis present

## 2019-04-11 DIAGNOSIS — Z992 Dependence on renal dialysis: Secondary | ICD-10-CM

## 2019-04-11 DIAGNOSIS — R569 Unspecified convulsions: Secondary | ICD-10-CM

## 2019-04-11 NOTE — ED Triage Notes (Signed)
Pt from home with ems for back spasm, states her pain medication isnt working. Pt yelling in pain.

## 2019-04-12 ENCOUNTER — Emergency Department (HOSPITAL_COMMUNITY): Payer: Medicare Other

## 2019-04-12 DIAGNOSIS — J45909 Unspecified asthma, uncomplicated: Secondary | ICD-10-CM | POA: Diagnosis present

## 2019-04-12 DIAGNOSIS — E1169 Type 2 diabetes mellitus with other specified complication: Secondary | ICD-10-CM | POA: Diagnosis not present

## 2019-04-12 DIAGNOSIS — E1069 Type 1 diabetes mellitus with other specified complication: Secondary | ICD-10-CM | POA: Diagnosis not present

## 2019-04-12 DIAGNOSIS — I2510 Atherosclerotic heart disease of native coronary artery without angina pectoris: Secondary | ICD-10-CM | POA: Diagnosis present

## 2019-04-12 DIAGNOSIS — Z794 Long term (current) use of insulin: Secondary | ICD-10-CM | POA: Diagnosis not present

## 2019-04-12 DIAGNOSIS — N186 End stage renal disease: Secondary | ICD-10-CM | POA: Diagnosis present

## 2019-04-12 DIAGNOSIS — Z9981 Dependence on supplemental oxygen: Secondary | ICD-10-CM | POA: Diagnosis not present

## 2019-04-12 DIAGNOSIS — M62830 Muscle spasm of back: Secondary | ICD-10-CM | POA: Diagnosis present

## 2019-04-12 DIAGNOSIS — R569 Unspecified convulsions: Secondary | ICD-10-CM | POA: Diagnosis not present

## 2019-04-12 DIAGNOSIS — Z86711 Personal history of pulmonary embolism: Secondary | ICD-10-CM | POA: Diagnosis not present

## 2019-04-12 DIAGNOSIS — E7800 Pure hypercholesterolemia, unspecified: Secondary | ICD-10-CM | POA: Diagnosis present

## 2019-04-12 DIAGNOSIS — Z20828 Contact with and (suspected) exposure to other viral communicable diseases: Secondary | ICD-10-CM | POA: Diagnosis present

## 2019-04-12 DIAGNOSIS — I5032 Chronic diastolic (congestive) heart failure: Secondary | ICD-10-CM | POA: Diagnosis present

## 2019-04-12 DIAGNOSIS — E8889 Other specified metabolic disorders: Secondary | ICD-10-CM | POA: Diagnosis present

## 2019-04-12 DIAGNOSIS — F419 Anxiety disorder, unspecified: Secondary | ICD-10-CM | POA: Diagnosis present

## 2019-04-12 DIAGNOSIS — Z89512 Acquired absence of left leg below knee: Secondary | ICD-10-CM | POA: Diagnosis not present

## 2019-04-12 DIAGNOSIS — E1022 Type 1 diabetes mellitus with diabetic chronic kidney disease: Secondary | ICD-10-CM | POA: Diagnosis present

## 2019-04-12 DIAGNOSIS — G40409 Other generalized epilepsy and epileptic syndromes, not intractable, without status epilepticus: Secondary | ICD-10-CM | POA: Diagnosis present

## 2019-04-12 DIAGNOSIS — E875 Hyperkalemia: Secondary | ICD-10-CM | POA: Diagnosis present

## 2019-04-12 DIAGNOSIS — M549 Dorsalgia, unspecified: Secondary | ICD-10-CM | POA: Diagnosis present

## 2019-04-12 DIAGNOSIS — R000 Tachycardia, unspecified: Secondary | ICD-10-CM | POA: Diagnosis present

## 2019-04-12 DIAGNOSIS — Z992 Dependence on renal dialysis: Secondary | ICD-10-CM | POA: Diagnosis not present

## 2019-04-12 DIAGNOSIS — E785 Hyperlipidemia, unspecified: Secondary | ICD-10-CM | POA: Diagnosis present

## 2019-04-12 DIAGNOSIS — R944 Abnormal results of kidney function studies: Secondary | ICD-10-CM | POA: Diagnosis present

## 2019-04-12 DIAGNOSIS — F329 Major depressive disorder, single episode, unspecified: Secondary | ICD-10-CM | POA: Diagnosis present

## 2019-04-12 DIAGNOSIS — I132 Hypertensive heart and chronic kidney disease with heart failure and with stage 5 chronic kidney disease, or end stage renal disease: Secondary | ICD-10-CM | POA: Diagnosis present

## 2019-04-12 DIAGNOSIS — D631 Anemia in chronic kidney disease: Secondary | ICD-10-CM | POA: Diagnosis present

## 2019-04-12 DIAGNOSIS — L089 Local infection of the skin and subcutaneous tissue, unspecified: Secondary | ICD-10-CM | POA: Diagnosis present

## 2019-04-12 LAB — CBC WITH DIFFERENTIAL/PLATELET
Abs Immature Granulocytes: 0.03 10*3/uL (ref 0.00–0.07)
Basophils Absolute: 0 10*3/uL (ref 0.0–0.1)
Basophils Relative: 0 %
Eosinophils Absolute: 0 10*3/uL (ref 0.0–0.5)
Eosinophils Relative: 0 %
HCT: 33.3 % — ABNORMAL LOW (ref 36.0–46.0)
Hemoglobin: 10.3 g/dL — ABNORMAL LOW (ref 12.0–15.0)
Immature Granulocytes: 0 %
Lymphocytes Relative: 8 %
Lymphs Abs: 0.6 10*3/uL — ABNORMAL LOW (ref 0.7–4.0)
MCH: 23.5 pg — ABNORMAL LOW (ref 26.0–34.0)
MCHC: 30.9 g/dL (ref 30.0–36.0)
MCV: 76 fL — ABNORMAL LOW (ref 80.0–100.0)
Monocytes Absolute: 0.2 10*3/uL (ref 0.1–1.0)
Monocytes Relative: 3 %
Neutro Abs: 7.6 10*3/uL (ref 1.7–7.7)
Neutrophils Relative %: 89 %
Platelets: 269 10*3/uL (ref 150–400)
RBC: 4.38 MIL/uL (ref 3.87–5.11)
RDW: 17.2 % — ABNORMAL HIGH (ref 11.5–15.5)
WBC: 8.5 10*3/uL (ref 4.0–10.5)
nRBC: 0 % (ref 0.0–0.2)

## 2019-04-12 LAB — URINALYSIS, ROUTINE W REFLEX MICROSCOPIC
Bacteria, UA: NONE SEEN
Bilirubin Urine: NEGATIVE
Glucose, UA: >=500 mg/dL — AB
Hgb urine dipstick: NEGATIVE
Ketones, ur: NEGATIVE mg/dL
Leukocytes,Ua: NEGATIVE
Nitrite: NEGATIVE
Protein, ur: >=300 mg/dL — AB
Specific Gravity, Urine: 1.012 (ref 1.005–1.030)
pH: 8 (ref 5.0–8.0)

## 2019-04-12 LAB — COMPREHENSIVE METABOLIC PANEL
ALT: 17 U/L (ref 0–44)
AST: 13 U/L — ABNORMAL LOW (ref 15–41)
Albumin: 3.6 g/dL (ref 3.5–5.0)
Alkaline Phosphatase: 90 U/L (ref 38–126)
Anion gap: 20 — ABNORMAL HIGH (ref 5–15)
BUN: 87 mg/dL — ABNORMAL HIGH (ref 6–20)
CO2: 20 mmol/L — ABNORMAL LOW (ref 22–32)
Calcium: 8.7 mg/dL — ABNORMAL LOW (ref 8.9–10.3)
Chloride: 96 mmol/L — ABNORMAL LOW (ref 98–111)
Creatinine, Ser: 12.92 mg/dL — ABNORMAL HIGH (ref 0.44–1.00)
GFR calc Af Amer: 3 mL/min — ABNORMAL LOW (ref >60)
GFR calc non Af Amer: 3 mL/min — ABNORMAL LOW (ref >60)
Glucose, Bld: 300 mg/dL — ABNORMAL HIGH (ref 70–99)
Potassium: 6.2 mmol/L — ABNORMAL HIGH (ref 3.5–5.1)
Sodium: 136 mmol/L (ref 135–145)
Total Bilirubin: 1.3 mg/dL — ABNORMAL HIGH (ref 0.3–1.2)
Total Protein: 7.6 g/dL (ref 6.5–8.1)

## 2019-04-12 LAB — SARS CORONAVIRUS 2 (TAT 6-24 HRS): SARS Coronavirus 2: NEGATIVE

## 2019-04-12 LAB — CBG MONITORING, ED: Glucose-Capillary: 263 mg/dL — ABNORMAL HIGH (ref 70–99)

## 2019-04-12 LAB — RAPID URINE DRUG SCREEN, HOSP PERFORMED
Amphetamines: NOT DETECTED
Barbiturates: NOT DETECTED
Benzodiazepines: NOT DETECTED
Cocaine: NOT DETECTED
Opiates: NOT DETECTED
Tetrahydrocannabinol: NOT DETECTED

## 2019-04-12 LAB — LIPASE, BLOOD: Lipase: 90 U/L — ABNORMAL HIGH (ref 11–51)

## 2019-04-12 LAB — GLUCOSE, CAPILLARY: Glucose-Capillary: 136 mg/dL — ABNORMAL HIGH (ref 70–99)

## 2019-04-12 MED ORDER — LORAZEPAM 2 MG/ML IJ SOLN
INTRAMUSCULAR | Status: AC
  Administered 2019-04-12: 1 mg via INTRAVENOUS
  Filled 2019-04-12: qty 1

## 2019-04-12 MED ORDER — LORAZEPAM 2 MG/ML IJ SOLN
INTRAMUSCULAR | Status: AC
  Filled 2019-04-12: qty 1

## 2019-04-12 MED ORDER — LORAZEPAM 2 MG/ML IJ SOLN: 1.0000 mg | Freq: Four times a day (QID) | INTRAMUSCULAR | Status: DC | PRN

## 2019-04-12 MED ORDER — LORAZEPAM 2 MG/ML IJ SOLN
1.0000 mg | Freq: Once | INTRAMUSCULAR | Status: AC
  Administered 2019-04-12: 1 mg via INTRAMUSCULAR

## 2019-04-12 MED ORDER — CALCIUM GLUCONATE-NACL 1-0.675 GM/50ML-% IV SOLN
1.0000 g | Freq: Once | INTRAVENOUS | Status: DC
  Filled 2019-04-12: qty 50

## 2019-04-12 MED ORDER — HYDROMORPHONE HCL 1 MG/ML IJ SOLN
INTRAMUSCULAR | Status: AC
  Administered 2019-04-12: 1 mg via INTRAVENOUS
  Filled 2019-04-12: qty 1

## 2019-04-12 MED ORDER — SODIUM CHLORIDE 0.9 % IV SOLN: 1.0000 g | Freq: Once | INTRAVENOUS | Status: DC

## 2019-04-12 MED ORDER — LORAZEPAM 2 MG/ML IJ SOLN
INTRAMUSCULAR | Status: AC
  Administered 2019-04-12: 1 mg via INTRAMUSCULAR
  Filled 2019-04-12: qty 1

## 2019-04-12 MED ORDER — LIDOCAINE 5 % EX PTCH
2.0000 | MEDICATED_PATCH | CUTANEOUS | Status: DC
  Administered 2019-04-12 – 2019-04-15 (×4): 2 via TRANSDERMAL
  Filled 2019-04-12 (×4): qty 2

## 2019-04-12 MED ORDER — LORAZEPAM 2 MG/ML IJ SOLN
1.0000 mg | INTRAMUSCULAR | Status: AC | PRN
  Administered 2019-04-12 – 2019-04-13 (×2): 1 mg via INTRAVENOUS
  Filled 2019-04-12 (×2): qty 1

## 2019-04-12 MED ORDER — LORAZEPAM 2 MG/ML IJ SOLN
1.0000 mg | Freq: Once | INTRAMUSCULAR | Status: AC
  Administered 2019-04-12: 13:00:00 1 mg via INTRAMUSCULAR

## 2019-04-12 MED ORDER — FENTANYL CITRATE (PF) 100 MCG/2ML IJ SOLN
50.0000 ug | Freq: Once | INTRAMUSCULAR | Status: AC
  Administered 2019-04-12: 50 ug via INTRAVENOUS
  Filled 2019-04-12: qty 2

## 2019-04-12 MED ORDER — HYDROMORPHONE HCL 1 MG/ML IJ SOLN
0.5000 mg | INTRAMUSCULAR | Status: AC | PRN
  Administered 2019-04-12 – 2019-04-13 (×3): 1 mg via INTRAVENOUS
  Filled 2019-04-12 (×3): qty 1

## 2019-04-12 MED ORDER — CHLORHEXIDINE GLUCONATE CLOTH 2 % EX PADS
6.0000 | MEDICATED_PAD | Freq: Every day | CUTANEOUS | Status: DC
  Administered 2019-04-13 – 2019-04-14 (×2): 6 via TOPICAL

## 2019-04-12 MED ORDER — LABETALOL HCL 5 MG/ML IV SOLN
10.0000 mg | INTRAVENOUS | Status: DC | PRN
  Administered 2019-04-12 – 2019-04-13 (×2): 10 mg via INTRAVENOUS
  Filled 2019-04-12 (×5): qty 4

## 2019-04-12 NOTE — Progress Notes (Signed)
Asked to see pt for ESRD.  She was talking and c/o back pain, after examining her she had a seizure, tonic-clonic, lasted about 45 seconds, Dr Roderic Palau also observed.  Now is post-ictal, have informed ED staff, getting Ativan IV or IM.  Kelly Splinter, MD 04/12/2019, 1:28 PM

## 2019-04-12 NOTE — ED Notes (Signed)
This RN informed that patient had a seizure that lasted about 45 seconds. Verbal order for 1mg  ativan given by Dr. Roderic Palau.

## 2019-04-12 NOTE — ED Notes (Signed)
Patient's IV not patent. PA Mina made aware. Verbal order for 1mg  ativan IM given.

## 2019-04-12 NOTE — ED Notes (Signed)
Pt yelling "help me, help me" to this RN. Pt reporting back pain. Verbal order for 1mg  of IM ativan given by Dr. Roderic Palau.

## 2019-04-12 NOTE — ED Notes (Signed)
Patient transported to CT 

## 2019-04-12 NOTE — H&P (Addendum)
[facility]                                                                                    Patient Demographics  Lauren Warner, is a 53 y.o. female  CSN: [id number]  MRN: [id number]  DOB - [birth year]  Admit Date - 04/11/2019  Outpatient Primary MD for the patient is Talbott, Brennan Bailey, [name]   With History of -  Past Medical History:  Diagnosis Date  . Anemia   . Anxiety   . Asthma   . CHF (congestive heart failure) ([place])   . Coronary artery disease   . Daily headache   . Depression   . ESRD (end stage renal disease) on dialysis ([place])    "[facility]; TTS" (10/19/2017)  . High cholesterol   . History of blood transfusion 10/19/2017   "anemia"  . Hyperkalemia 10/2017  . Hypertension   . On home oxygen therapy    "2L; daily" (10/19/2017)  . Pneumonia    "several times" (10/19/2017)  . Pulmonary embolism ([place]) 2012  . Type 1 diabetes mellitus ([place])       Past Surgical History:  Procedure Laterality Date  . AV FISTULA PLACEMENT Left 2018  . CESAREAN SECTION  1989  . CORONARY ANGIOPLASTY WITH STENT PLACEMENT  ~ 08/2017   "3 stents" (10/19/2017)  . ENDOMETRIAL ABLATION  ~ 2011    in for   Chief Complaint  Patient presents with  . Back Pain     HPI  Lauren Warner  is a 53 y.o. female, with past medical history significant for end-stage renal disease on hemodialysis who was brought for evaluation today for back pain.  Patient was admitted on 11/3 due to intractable nausea and vomiting and was discharged after she was improved.  Work-up was essentially negative.  Today she came originally complaining of generalized pain, persistent, mainly in her back. Patient is in hemodialysis TTS and she missed dialysis the last 2 sessions and is presenting today with the above complaint in addition to nausea, vomiting and shortness of breath. Upon examination the patient could not give any history she was totally confused after she received Ativan for witnessed  seizure in the emergency room.  No history of seizures was noted in her past medical history.  There was no phone to talk to her family when I was evaluating her.  Patient could not give me a history of seizures. CT scan of the head in the emergency room was negative.  COVID-19 is pending .  Blood work in the emergency room showed uremia with elevated creatinine and elevated potassium for which she received Lokelma.    Review of Systems    Unable to obtain due to patient's condition   Social History Social History   Tobacco Use  . Smoking status: Never Smoker  . Smokeless tobacco: Never Used  Substance Use Topics  . Alcohol use: Never    Frequency: Never     Family History Family History  Problem Relation Age of Onset  . Stroke Sister      Prior to Admission medications   Medication Sig Start Date End Date Taking? Authorizing Provider  albuterol (PROAIR HFA) 108 (90 Base) MCG/ACT inhaler Inhale 2 puffs into the lungs every 6 (six) hours as needed for wheezing or shortness of breath. 12/04/17   Roxan Hockey, MD  aspirin EC 81 MG tablet Take 1 tablet (81 mg total) by mouth daily. With food 12/04/17   Roxan Hockey, MD  atomoxetine (STRATTERA) 40 MG capsule Take 40 mg by mouth daily.    [provider]  blood glucose meter kit and supplies KIT Dispense based on patient and insurance preference. Use up to four times daily as directed. (FOR ICD-9 250.00, 250.01). 04/08/19   Aline August, MD  Blood Pressure KIT Check blood pressure once a day till evaluated by PCP 04/08/19   Aline August, MD  calcitRIOL (ROCALTROL) 0.25 MCG capsule Take 1 capsule (0.25 mcg total) by mouth Every Tuesday,Thursday,and Saturday with dialysis. 04/09/19   Aline August, MD  calcium acetate (PHOSLO) 667 MG capsule Take 1 capsule (667 mg total) by mouth 3 (three) times daily with meals. 04/08/19   Aline August, MD  clopidogrel (PLAVIX) 75 MG tablet Take 1 tablet (75 mg total) by mouth daily.  04/08/19   Aline August, MD  escitalopram (LEXAPRO) 10 MG tablet Take 1 tablet (10 mg total) by mouth daily. 04/08/19   Aline August, MD  gabapentin (NEURONTIN) 100 MG capsule Take 1 capsule (100 mg total) by mouth 3 (three) times daily. 04/08/19   Aline August, MD  insulin aspart (NOVOLOG FLEXPEN) 100 UNIT/ML FlexPen Inject 3 Units into the skin 3 (three) times daily with meals. 04/08/19   Aline August, MD  Insulin Pen Needle 31G X 5 MM MISC Humalog 3 units 3 times a day with meals 04/08/19   Aline August, MD  latanoprost (XALATAN) 0.005 % ophthalmic solution Place 1 drop into both eyes at bedtime. 12/04/17   Roxan Hockey, MD  lidocaine (LIDODERM) 5 % Place 1 patch onto the skin daily. Remove & Discard patch within 12 hours or as directed by MD 04/09/19   Aline August, MD  lidocaine-prilocaine (EMLA) cream Apply 1 application topically as needed. 1 hour before dialysis 02/11/18   [provider]  methocarbamol (ROBAXIN) 500 MG tablet Take 1 tablet (500 mg total) by mouth every 6 (six) hours as needed for muscle spasms (back pain). 04/08/19   Aline August, MD  multivitamin (RENA-VIT) TABS tablet Take 1 tablet by mouth at bedtime. 04/08/19   Aline August, MD  ondansetron (ZOFRAN) 4 MG tablet Take 1 tablet (4 mg total) by mouth every 6 (six) hours as needed for nausea. 04/08/19   Aline August, MD  pantoprazole (PROTONIX) 40 MG tablet Take 1 tablet (40 mg total) by mouth daily. 04/08/19 05/08/19  Aline August, MD  QUEtiapine (SEROQUEL) 100 MG tablet Take 1 tablet (100 mg total) by mouth at bedtime. 04/08/19   Aline August, MD  sevelamer carbonate (RENVELA) 800 MG tablet Take 3 tablets (2,400 mg total) by mouth 3 (three) times daily with meals. 04/08/19 05/08/19  Aline August, MD  sodium zirconium cyclosilicate (LOKELMA) 10 g PACK packet Take 10 g by mouth daily. 04/09/19 05/09/19  Aline August, MD  traMADol (ULTRAM) 50 MG tablet Take 1 tablet (50 mg total) by mouth every 8 (eight)  hours as needed for moderate pain. 04/08/19   Aline August, MD  traZODone (DESYREL) 50 MG tablet Take 1 tablet (50 mg total) by mouth at bedtime as needed for sleep. 04/08/19   Aline August, MD    Allergies  Allergen Reactions  . Adhesive [  Tape] Other (See Comments)    "RIPS MY SKIN," SO PLEASE USE COBAN WRAP!!  . Other Rash    "RIPS MY SKIN," SO PLEASE USE COBAN WRAP!! "RIPS MY SKIN," SO PLEASE USE COBAN WRAP!!  . Hydrocodone-Acetaminophen Other (See Comments)    "Makes me feel crazy"  . Metformin And Related Other (See Comments)    "Makes me feel crazy"    Physical Exam  Vitals  Blood pressure (!) 245/111, pulse (!) 107, temperature 98 F (36.7 C), temperature source Oral, resp. rate 17, SpO2 100 %.   General appearance well-developed, completely confused, status post Ativan HEENT no jaundice or pallor, no facial deviation oral thrush, dysgeusia noted Neck supple, no neck vein distention Chest mild scattered rhonchi Heart normal S1-S2, Abdomen soft, nontender Extremities no clubbing or cyanosis status post left above-knee amputation Neuro gross nonfocal Skin no rashes or ulcers     Data Review  CBC Recent Labs  Lab 04/06/19 1107 04/07/19 0604 04/08/19 0210 04/12/19 1148  WBC 6.6 4.6 3.9* 8.5  HGB 9.8* 8.1* 9.5* 10.3*  HCT 32.7* 25.0* 31.1* 33.3*  PLT 192 126* 213 269  MCV 77.7* 73.7* 76.4* 76.0*  MCH 23.3* 23.9* 23.3* 23.5*  MCHC 30.0 32.4 30.5 30.9  RDW 17.3* 17.2* 17.5* 17.2*  LYMPHSABS  --   --  1.6 0.6*  MONOABS  --   --  0.4 0.2  EOSABS  --   --  0.2 0.0  BASOSABS  --   --  0.1 0.0   ------------------------------------------------------------------------------------------------------------------  Chemistries  Recent Labs  Lab 04/06/19 1107 04/07/19 0604 04/08/19 0210 04/12/19 1148  NA 140 138 137 136  K 5.7* 7.1* 4.1 6.2*  CL 96* 93* 96* 96*  CO2 19* 23 27 20*  GLUCOSE 295* 191* 71 300*  BUN 128* 141* 44* 87*  CREATININE 10.71*  12.09* 6.33* 12.92*  CALCIUM 8.0* 8.2* 8.3* 8.7*  AST 10*  --  13* 13*  ALT 14  --  18 17  ALKPHOS 81  --  74 90  BILITOT 0.8  --  0.7 1.3*   ------------------------------------------------------------------------------------------------------------------ estimated creatinine clearance is 5.8 mL/min (A) (by C-G formula based on SCr of 12.92 mg/dL (H)). ------------------------------------------------------------------------------------------------------------------ No results for input(s): TSH, T4TOTAL, T3FREE, THYROIDAB in the last 72 hours.  Invalid input(s): FREET3   Coagulation profile No results for input(s): INR, PROTIME in the last 168 hours. ------------------------------------------------------------------------------------------------------------------- No results for input(s): DDIMER in the last 72 hours. -------------------------------------------------------------------------------------------------------------------  Cardiac Enzymes No results for input(s): CKMB, TROPONINI, MYOGLOBIN in the last 168 hours.  Invalid input(s): CK ------------------------------------------------------------------------------------------------------------------ Invalid input(s): POCBNP   ---------------------------------------------------------------------------------------------------------------  Urinalysis    Component Value Date/Time   COLORURINE YELLOW 04/12/2019 1430   APPEARANCEUR CLEAR 04/12/2019 1430   LABSPEC 1.012 04/12/2019 1430   PHURINE 8.0 04/12/2019 1430   GLUCOSEU >=500 (A) 04/12/2019 1430   HGBUR NEGATIVE 04/12/2019 1430   BILIRUBINUR NEGATIVE 04/12/2019 1430   KETONESUR NEGATIVE 04/12/2019 1430   PROTEINUR >=300 (A) 04/12/2019 1430   NITRITE NEGATIVE 04/12/2019 1430   LEUKOCYTESUR NEGATIVE 04/12/2019 1430    ----------------------------------------------------------------------------------------------------------------   Imaging results:   Dg Abdomen 1  View  Result Date: 04/06/2019 CLINICAL DATA:  53 year old female with nausea vomiting. EXAM: ABDOMEN - 1 VIEW COMPARISON:  CT of the abdomen pelvis dated 04/06/2019. FINDINGS: Evaluation is limited due to body habitus and soft tissue attenuation. There is no bowel dilatation or evidence of obstruction. No free air identified. Multiple echogenic structures in the right flank correspond to  the gallstones. There is vascular calcification. An IVC filter is noted. The osseous structures are intact. IMPRESSION: 1. No bowel obstruction. 2. Cholelithiasis. 3. Vascular calcification. Electronically Signed   By: Anner Crete M.D.   On: 04/06/2019 16:07   Ct Head Wo Contrast  Result Date: 04/12/2019 CLINICAL DATA:  Seizure EXAM: CT HEAD WITHOUT CONTRAST TECHNIQUE: Contiguous axial images were obtained from the base of the skull through the vertex without intravenous contrast. COMPARISON:  2019 FINDINGS: Brain: There is no acute intracranial hemorrhage, mass-effect, or edema. Gray-white differentiation is preserved. There is no extra-axial fluid collection. Ventricles and sulci are within normal limits in size and configuration. Vascular: There is atherosclerotic calcification at the skull base. Skull: Calvarium is unremarkable. Sinuses/Orbits: No acute finding. Other: None. IMPRESSION: No acute intracranial hemorrhage, mass effect, or evidence of acute infarction. Electronically Signed   By: Macy Mis M.D.   On: 04/12/2019 14:21   Ct Abdomen Pelvis W Contrast  Result Date: 04/06/2019 CLINICAL DATA:  Abdominal pain. Left-sided abdominal pain EXAM: CT ABDOMEN AND PELVIS WITH CONTRAST TECHNIQUE: Multidetector CT imaging of the abdomen and pelvis was performed using the standard protocol following bolus administration of intravenous contrast. CONTRAST:  126m OMNIPAQUE IOHEXOL 300 MG/ML  SOLN COMPARISON:  April 05, 2019 FINDINGS: Lower chest: The lung bases are clear. The heart size is normal. Hepatobiliary:  The liver is normal. Cholelithiasis without acute inflammation.There is no biliary ductal dilation. Pancreas: There are multiple small cystic lesions in the pancreatic body, some of which are stable from prior study in 2019, while others appear new. Spleen: No splenic laceration or hematoma. Adrenals/Urinary Tract: --Adrenal glands: No adrenal hemorrhage. --Right kidney/ureter: No hydronephrosis or perinephric hematoma. --Left kidney/ureter: No hydronephrosis or perinephric hematoma. --Urinary bladder: Unremarkable. Stomach/Bowel: --Stomach/Duodenum: There is wall thickening of the distal esophagus. --Small bowel: No dilatation or inflammation. --Colon: No focal abnormality. --Appendix: Not visualized. No right lower quadrant inflammation or free fluid. Vascular/Lymphatic: Atherosclerotic calcification is present within the non-aneurysmal abdominal aorta, without hemodynamically significant stenosis. There is an IVC filter in place --there mild enlarged retroperitoneal lymph nodes. --No mesenteric lymphadenopathy. --there is few prominent pelvic lymph nodes. Reproductive: There is a fibroid uterus. Other: No ascites or free air. The abdominal wall is normal. Musculoskeletal. No acute displaced fractures. IMPRESSION: 1. No acute intra-abdominal or intrapelvic process. 2. Multiple small cystic lesions in the pancreatic body, some of which are stable from prior study in 2019, while others appear new. These could be further evaluated with MRI of the abdomen with and without contrast on a nonemergent basis. 3. Cholelithiasis without acute inflammation. 4. Aortic atherosclerosis. 5. There is wall thickening of the distal esophagus. Correlate for symptoms of esophagitis. 6. Fibroid uterus. Aortic Atherosclerosis (ICD10-I70.0). Electronically Signed   By: CConstance HolsterM.D.   On: 04/06/2019 05:05   Dg Chest Portable 1 View  Result Date: 04/12/2019 CLINICAL DATA:  Shortness of breath EXAM: PORTABLE CHEST 1 VIEW  COMPARISON:  March 09, 2019 FINDINGS: Likely chronic interstitial prominence with mild superimposed interstitial edema. Left basilar atelectasis or consolidation. Stable cardiomediastinal silhouette. IMPRESSION: Mild interstitial edema and left basilar atelectasis/consolidation. Shows basilar atelectasis and consolidation Electronically Signed   By: PMacy MisM.D.   On: 04/12/2019 09:50   Ct Renal Stone Study  Result Date: 04/05/2019 CLINICAL DATA:  Left side abdominal pain, nausea, vomiting, left flank pain EXAM: CT ABDOMEN AND PELVIS WITHOUT CONTRAST TECHNIQUE: Multidetector CT imaging of the abdomen and pelvis was performed following the standard protocol without  IV contrast. COMPARISON:  12/23/2017 FINDINGS: Lower chest: Cardiomegaly. Dense calcifications in the visualized right coronary artery. No acute abnormality. Hepatobiliary: Layering gallstones within the gallbladder. No focal hepatic abnormality. Pancreas: No focal abnormality or ductal dilatation. Spleen: No focal abnormality.  Normal size. Adrenals/Urinary Tract: No adrenal abnormality. No focal renal abnormality. No stones or hydronephrosis. Urinary bladder is unremarkable. Stomach/Bowel: Stomach, large and small bowel grossly unremarkable. Vascular/Lymphatic: Aortic and branch vessel atherosclerosis. No aneurysm or adenopathy. IVC filter noted. Reproductive: Calcifications in the uterus compatible with fibroids. No adnexal mass. Other: No free fluid or free air. Musculoskeletal: No acute bony abnormality. IMPRESSION: No evidence of renal or ureteral stones.  No hydronephrosis. Coronary artery disease. Aortic atherosclerosis and branch vessel atherosclerosis. Cholelithiasis. No acute findings in the abdomen or pelvis. Electronically Signed   By: Rolm Baptise M.D.   On: 04/05/2019 22:04    My personal review of EKG: Sinus tach at 112 bpm with nonspecific T wave and ST changes Reviewed chart from 11/3  Assessment & Plan  Uremia with  history of chronic renal disease on hemodialysis Nephrology was consulted For hemodialysis after COVID-19 test is back  Hyperkalemia For hemodialysis Continue with Lokelma and calcium  Seizure episode Discussed with neurology Dr. Lorraine Lax on the phone No need for antiseizure medications at this time due to the fact that the seizure happened in the setting of electrolyte abnormalities and severe uremia Observe, advised no driving for the next 6 months Keep Ativan as needed and consult neurology in the event of another episode.  Diabetes mellitus type 1 Insulin sliding scale  History of congestive heart failure Status post echo with normal ejection fraction on 09/2017 and grade 2 diastolic dysfunction.    DVT Prophylaxis Lovenox  AM Labs Ordered, also please review Full Orders  Family Communication: No contact on the chart  Code Status full  Disposition Plan: Home  Time spent in minutes : More than 50 minutes  Condition GUARDED   @SIGNATURE @

## 2019-04-12 NOTE — Procedures (Signed)
   I was present at this dialysis session, have reviewed the session itself and made  appropriate changes Rob Eugenie Harewood MD [facility] pager [phone]   04/12/2019, 4:43 PM    

## 2019-04-12 NOTE — ED Notes (Signed)
Patient complaining of back pain and nausea. Patient has a bka and in wheelchair. Patient slid from chair onto the floor screaming "help me help me I need dialysis i'm dying and no one is helping me." This Probation officer and an EMT walked over to patient and after sternal rub she started coughing stating she needs help and no one would help her. This Probation officer along with security [employer] and the EMT helped patient back to chair. Explained to patient she could not fall on floor to be seen quicker. Patient verbalized understanding. Patient states it wasn't on purpose but she was in pain.

## 2019-04-12 NOTE — ED Notes (Addendum)
This EMT was walking through the lobby when this pt was witnessed pushing herself out of the wheelchair and onto the floor. The pt rolled around in the floor and then laid still. The pt was not responsive to verbal commands, but maintained strong respiratory drive and pulses. Pt was promptly sternal rubbed and was quickly responsive to the painful stimuli. Pt began wailing again, yelling that she was in pain, needed dialysis, ans was having back spasms. Woody, RN in triage and green zone informed of situation and pt sentiments. D/t pt being loud, disturbing other patients, and no following staff commands previously, this emt was instructed to help pt back into the wheelchair and she was to remain in the lobby until there was a room for her to be seen.   This EMT, Erin, NT, members of the security team, and [employer] off-duty officers helped to return the pt to her wheelchair. The pt then tried to slide out of the wheelchair again once she had been assisted to being seated. I had positioned the wheelchair and my leg in such a way so that she was not able to put herself on the floor again. Pt was instructed that she will be called when we have a room for her, but until then she needs to do her best to stay in her wheelchair, not disturb other pts in the waiting room, and follow instructions from staff. Pt was finally reminded that if she chooses to roll around on the floor again, she could hurt herself, get sick, or otherwise make her situation worse. Pt expressed understanding to situation and requests.   Gretta Cool, RN and [place], Agricultural consultant aware.

## 2019-04-12 NOTE — ED Provider Notes (Signed)
[facility] EMERGENCY DEPARTMENT Provider Note   CSN: [id number] Arrival date & time: 04/11/19  2254     History   Chief Complaint Chief Complaint  Patient presents with   Back Pain    HPI Lauren Warner is a 53 y.o. female with history of anemia, anxiety, asthma, CHF, ESRD on dialysis Tuesday Thursday Saturday, hyperlipidemia, hypertension, pulmonary embolism in 2012, insulin-dependent diabetes mellitus presents today for evaluation of acute onset, persistent all over body pain for 2 days.  She reports aching pain to her abdomen, chest, and back "all over ".  Pain radiates down her right lower extremity.  She is status post left BKA in June of this year she says due to Covid however chart review shows that this was actually secondary to a necrotizing skin infection associated with wound/pressure ulcer of left heel.  She notes pain worsens with movement.  She has been on dialysis for 2 years, notes she does still produce some urine.  Notes some dysuria but denies hematuria urgency or frequency.  No fevers.  She missed dialysis today due to her pain and also was not dialyzed on Saturday, reports she received a full treatment last Thursday.  Tells me that she feels short of breath and that she is supposed to be on 2 L supplemental oxygen at all times; states  her O2 tank ran out yesterday.  However she also tells me she was not on any supplemental oxygen while in the waiting room where she was waiting since 11pm of last night and when I hooked her up to pulse oximetry, she has SpO2 saturations of 97%. Has had multiple episode of nonbloody nonbilious emesis since symptoms began, though this is not unusual for her and she has had admissions for intractable nausea and vomiting preciously.  She tells me that after her most recent admission to the hospital she was placed in motel with home health coming to visit her though she is "supposed to be kicked out on Wednesday" which is tomorrow.   She is writhing around bed, yelling. She was noted to have thrown herself out of her wheelchair while in the waiting room.      The history is provided by the patient and medical records.    Past Medical History:  Diagnosis Date   Anemia    Anxiety    Asthma    CHF (congestive heart failure) ([place])    Coronary artery disease    Daily headache    Depression    ESRD (end stage renal disease) on dialysis ([place])    "[facility]; TTS" (10/19/2017)   High cholesterol    History of blood transfusion 10/19/2017   "anemia"   Hyperkalemia 10/2017   Hypertension    On home oxygen therapy    "2L; daily" (10/19/2017)   Pneumonia    "several times" (10/19/2017)   Pulmonary embolism ([place]) 2012   Type 1 diabetes mellitus ([place])     Patient Active Problem List   Diagnosis Date Noted   Seizures ([place]) 04/12/2019   Abdominal pain 04/06/2019   Left below-knee amputee ([place]) 03/24/2019   Major depressive disorder, recurrent episode, moderate ([place]) 03/11/2019   Acute on chronic diastolic (congestive) heart failure ([place]) 03/10/2019   Encephalopathy, metabolic 93/81/0175   Acute on chronic congestive heart failure ([place])    Noncompliance with renal dialysis ([place])    MDD (major depressive disorder), single episode, severe , no psychosis ([place])    SOB (shortness of breath) 01/22/2018  Acute encephalopathy 01/07/2018   Suicidal ideation 01/07/2018   Hypertensive encephalopathy 12/23/2017   Hypertensive urgency 12/22/2017   Syncope and collapse 12/03/2017   Hyperkalemia 11/10/2017   Pressure injury of skin 10/20/2017   Essential hypertension 10/19/2017   CAD (coronary artery disease) 10/19/2017   ESRD on dialysis ([place]) 10/19/2017   Syncope 10/19/2017   Anemia due to end stage renal disease ([place]) 10/19/2017   Closed left ankle fracture 10/19/2017   Nausea vomiting and diarrhea 10/19/2017   DM (diabetes mellitus), type 2 with renal complications ([place])  95/28/4132   Acute respiratory failure with hypoxia ([place]) 10/18/2017    Past Surgical History:  Procedure Laterality Date   AV FISTULA PLACEMENT Left 2018   CESAREAN SECTION  1989   CORONARY ANGIOPLASTY WITH STENT PLACEMENT  ~ 08/2017   "3 stents" (10/19/2017)   ENDOMETRIAL ABLATION  ~ 2011     OB History   No obstetric history on file.      Home Medications    Prior to Admission medications   Medication Sig Start Date End Date Taking? Authorizing Provider  albuterol (PROAIR HFA) 108 (90 Base) MCG/ACT inhaler Inhale 2 puffs into the lungs every 6 (six) hours as needed for wheezing or shortness of breath. 12/04/17   Roxan Hockey, MD  aspirin EC 81 MG tablet Take 1 tablet (81 mg total) by mouth daily. With food 12/04/17   Roxan Hockey, MD  atomoxetine (STRATTERA) 40 MG capsule Take 40 mg by mouth daily.    [provider]  blood glucose meter kit and supplies KIT Dispense based on patient and insurance preference. Use up to four times daily as directed. (FOR ICD-9 250.00, 250.01). 04/08/19   Aline August, MD  Blood Pressure KIT Check blood pressure once a day till evaluated by PCP 04/08/19   Aline August, MD  calcitRIOL (ROCALTROL) 0.25 MCG capsule Take 1 capsule (0.25 mcg total) by mouth Every Tuesday,Thursday,and Saturday with dialysis. 04/09/19   Aline August, MD  calcium acetate (PHOSLO) 667 MG capsule Take 1 capsule (667 mg total) by mouth 3 (three) times daily with meals. 04/08/19   Aline August, MD  clopidogrel (PLAVIX) 75 MG tablet Take 1 tablet (75 mg total) by mouth daily. 04/08/19   Aline August, MD  escitalopram (LEXAPRO) 10 MG tablet Take 1 tablet (10 mg total) by mouth daily. 04/08/19   Aline August, MD  gabapentin (NEURONTIN) 100 MG capsule Take 1 capsule (100 mg total) by mouth 3 (three) times daily. 04/08/19   Aline August, MD  insulin aspart (NOVOLOG FLEXPEN) 100 UNIT/ML FlexPen Inject 3 Units into the skin 3 (three) times daily with meals.  04/08/19   Aline August, MD  Insulin Pen Needle 31G X 5 MM MISC Humalog 3 units 3 times a day with meals 04/08/19   Aline August, MD  latanoprost (XALATAN) 0.005 % ophthalmic solution Place 1 drop into both eyes at bedtime. 12/04/17   Roxan Hockey, MD  lidocaine (LIDODERM) 5 % Place 1 patch onto the skin daily. Remove & Discard patch within 12 hours or as directed by MD 04/09/19   Aline August, MD  lidocaine-prilocaine (EMLA) cream Apply 1 application topically as needed. 1 hour before dialysis 02/11/18   [provider]  methocarbamol (ROBAXIN) 500 MG tablet Take 1 tablet (500 mg total) by mouth every 6 (six) hours as needed for muscle spasms (back pain). 04/08/19   Aline August, MD  multivitamin (RENA-VIT) TABS tablet Take 1 tablet by mouth at bedtime. 04/08/19  Aline August, MD  ondansetron (ZOFRAN) 4 MG tablet Take 1 tablet (4 mg total) by mouth every 6 (six) hours as needed for nausea. 04/08/19   Aline August, MD  pantoprazole (PROTONIX) 40 MG tablet Take 1 tablet (40 mg total) by mouth daily. 04/08/19 05/08/19  Aline August, MD  QUEtiapine (SEROQUEL) 100 MG tablet Take 1 tablet (100 mg total) by mouth at bedtime. 04/08/19   Aline August, MD  sevelamer carbonate (RENVELA) 800 MG tablet Take 3 tablets (2,400 mg total) by mouth 3 (three) times daily with meals. 04/08/19 05/08/19  Aline August, MD  sodium zirconium cyclosilicate (LOKELMA) 10 g PACK packet Take 10 g by mouth daily. 04/09/19 05/09/19  Aline August, MD  traMADol (ULTRAM) 50 MG tablet Take 1 tablet (50 mg total) by mouth every 8 (eight) hours as needed for moderate pain. 04/08/19   Aline August, MD  traZODone (DESYREL) 50 MG tablet Take 1 tablet (50 mg total) by mouth at bedtime as needed for sleep. 04/08/19   Aline August, MD    Family History Family History  Problem Relation Age of Onset   Stroke Sister     Social History Social History   Tobacco Use   Smoking status: Never Smoker   Smokeless  tobacco: Never Used  Substance Use Topics   Alcohol use: Never    Frequency: Never   Drug use: Never     Allergies   Adhesive [tape], Other, Hydrocodone-acetaminophen, and Metformin and related   Review of Systems Review of Systems  Constitutional: Negative for chills and fever.  Respiratory: Positive for shortness of breath.   Cardiovascular: Positive for chest pain.  Gastrointestinal: Positive for abdominal pain, nausea and vomiting.  Genitourinary: Positive for dysuria. Negative for hematuria.  Musculoskeletal: Positive for back pain.  All other systems reviewed and are negative.    Physical Exam Updated Vital Signs BP (!) 153/111    Pulse (!) 101    Temp 98.6 F (37 C) (Oral)    Resp (!) 30    SpO2 100%   Physical Exam Vitals signs and nursing note reviewed.  Constitutional:      General: She is not in acute distress.    Appearance: She is well-developed.  HENT:     Head: Normocephalic and atraumatic.  Eyes:     General:        Right eye: No discharge.        Left eye: No discharge.     Conjunctiva/sclera: Conjunctivae normal.  Neck:     Musculoskeletal: Neck supple.     Vascular: No JVD.     Trachea: No tracheal deviation.  Cardiovascular:     Rate and Rhythm: Regular rhythm. Tachycardia present.     Pulses: Normal pulses.     Comments: Dialysis fistula in the left upper extremity with palpable thrill.  2+ bilateral radial and right DP/PT pulses Pulmonary:     Effort: Pulmonary effort is normal.     Comments: Diffuse tenderness to palpation of the anterior and lateral chest wall with no deformity, crepitus, ecchymosis, or flail segment.  Bibasilar crackles noted. Chest:     Chest wall: Tenderness present.  Abdominal:     General: Bowel sounds are normal. There is no distension.     Palpations: Abdomen is soft.     Tenderness: There is no guarding or rebound.  Musculoskeletal:     Comments: Diffuse tenderness to palpation of the spine with bilateral  paraspinal muscle tenderness.  No deformity, crepitus, or  step-off.  No focal tenderness.  Moves all extremities spontaneously without difficulty.  5/5 strength of BUE and right lower extremity major muscle groups.  Skin:    General: Skin is warm and dry.     Findings: No erythema.  Neurological:     Mental Status: She is alert.  Psychiatric:        Behavior: Behavior normal.      ED Treatments / Results  Labs (all labs ordered are listed, but only abnormal results are displayed) Labs Reviewed  COMPREHENSIVE METABOLIC PANEL - Abnormal; Notable for the following components:      Result Value   Potassium 6.2 (*)    Chloride 96 (*)    CO2 20 (*)    Glucose, Bld 300 (*)    BUN 87 (*)    Creatinine, Ser 12.92 (*)    Calcium 8.7 (*)    AST 13 (*)    Total Bilirubin 1.3 (*)    GFR calc non Af Amer 3 (*)    GFR calc Af Amer 3 (*)    Anion gap 20 (*)    All other components within normal limits  CBC WITH DIFFERENTIAL/PLATELET - Abnormal; Notable for the following components:   Hemoglobin 10.3 (*)    HCT 33.3 (*)    MCV 76.0 (*)    MCH 23.5 (*)    RDW 17.2 (*)    Lymphs Abs 0.6 (*)    All other components within normal limits  LIPASE, BLOOD - Abnormal; Notable for the following components:   Lipase 90 (*)    All other components within normal limits  URINALYSIS, ROUTINE W REFLEX MICROSCOPIC - Abnormal; Notable for the following components:   Glucose, UA >=500 (*)    Protein, ur >=300 (*)    All other components within normal limits  CBG MONITORING, ED - Abnormal; Notable for the following components:   Glucose-Capillary 263 (*)    All other components within normal limits  SARS CORONAVIRUS 2 (TAT 6-24 HRS)  RAPID URINE DRUG SCREEN, HOSP PERFORMED  BASIC METABOLIC PANEL    EKG None  Radiology Ct Head Wo Contrast  Result Date: 04/12/2019 CLINICAL DATA:  Seizure EXAM: CT HEAD WITHOUT CONTRAST TECHNIQUE: Contiguous axial images were obtained from the base of the skull  through the vertex without intravenous contrast. COMPARISON:  2019 FINDINGS: Brain: There is no acute intracranial hemorrhage, mass-effect, or edema. Gray-white differentiation is preserved. There is no extra-axial fluid collection. Ventricles and sulci are within normal limits in size and configuration. Vascular: There is atherosclerotic calcification at the skull base. Skull: Calvarium is unremarkable. Sinuses/Orbits: No acute finding. Other: None. IMPRESSION: No acute intracranial hemorrhage, mass effect, or evidence of acute infarction. Electronically Signed   By: Macy Mis M.D.   On: 04/12/2019 14:21   Dg Chest Portable 1 View  Result Date: 04/12/2019 CLINICAL DATA:  Shortness of breath EXAM: PORTABLE CHEST 1 VIEW COMPARISON:  March 09, 2019 FINDINGS: Likely chronic interstitial prominence with mild superimposed interstitial edema. Left basilar atelectasis or consolidation. Stable cardiomediastinal silhouette. IMPRESSION: Mild interstitial edema and left basilar atelectasis/consolidation. Shows basilar atelectasis and consolidation Electronically Signed   By: Macy Mis M.D.   On: 04/12/2019 09:50    Procedures .Critical Care Performed by: Renita Papa, PA-C Authorized by: Renita Papa, PA-C   Critical care provider statement:    Critical care time (minutes):  40   Critical care was necessary to treat or prevent imminent or life-threatening deterioration of the  following conditions:  Metabolic crisis and renal failure   Critical care was time spent personally by me on the following activities:  Discussions with consultants, evaluation of patient's response to treatment, examination of patient, ordering and performing treatments and interventions, ordering and review of laboratory studies, ordering and review of radiographic studies, pulse oximetry, re-evaluation of patient's condition, obtaining history from patient or surrogate and review of old charts   (including critical care  time)  Medications Ordered in ED Medications  lidocaine (LIDODERM) 5 % 2 patch (2 patches Transdermal Patch Applied 04/12/19 1011)  calcium gluconate 1 g/ 50 mL sodium chloride IVPB (has no administration in time range)  Chlorhexidine Gluconate Cloth 2 % PADS 6 each (has no administration in time range)  labetalol (NORMODYNE) injection 10-20 mg (has no administration in time range)  HYDROmorphone (DILAUDID) injection 0.5-1 mg (1 mg Intravenous Given 04/12/19 1705)  LORazepam (ATIVAN) injection 1 mg (1 mg Intravenous Given 04/12/19 1707)  fentaNYL (SUBLIMAZE) injection 50 mcg (50 mcg Intravenous Given 04/12/19 1112)  LORazepam (ATIVAN) injection 1 mg (1 mg Intramuscular Given 04/12/19 1318)  LORazepam (ATIVAN) injection 1 mg (1 mg Intramuscular Given 04/12/19 1457)     Initial Impression / Assessment and Plan / ED Course  I have reviewed the triage vital signs and the nursing notes.  Pertinent labs & imaging results that were available during my care of the patient were reviewed by me and considered in my medical decision making (see chart for details).   Patient presenting for evaluation of generalized pain, mostly in her back.  She has diffuse tenderness to palpation of the thoracolumbar region of the back, no focal tenderness.  She is neurovascularly intact.  She appears uncomfortable, somewhat agitated and even a little bit confused.  She does tell me that she is on supplemental oxygen however has not required any while in the ED with SPO2 saturations 100% on room air.  However she is persistently hypertensive and tachycardic in the ED.  Her chest x-ray shows mild interstitial edema and left basilar atelectasis/consolidation lab work today reviewed by me significant for BUN 87, creatinine 12.92, potassium 6.2, anion gap 20, hemoglobin 10.3, lipase 90 however the latter 2 lab values are around baseline for patient.  She will need dialysis given her hypertension, electrolyte abnormalities,  uremia, and evidence of fluid overload on chest x-ray.  12:58 PM CONSULT: Spoke with Dr. Jonnie Finner with nephrology.  Unfortunately our dialysis team will not take patients without a negative Covid test at this time.  He recommends giving the patient Lokelma and calcium for now while waiting for Covid results.  Also recommends admission to the hospitalist service.  1:20PM Dr. Jonnie Finner informed me that during his evaluation in the ED patient had 45 seconds of grand mal seizure-like activity, appears post-ictal now.  This was witnessed by Dr. Burnett Sheng and Dr. Roderic Palau.  Patient has no history of seizures.  She did lose her IV access and was given 1 mg Ativan IM.  ?withdrawal seizure with not having had her gabapentin due to persistent nausea and vomiting.  More likely metabolic due to elevated BUN/creatinine and anion gap.  Will obtain head CT to rule out acute intracranial abnormality including hemorrhage.  Spoke with Dr. Laren Everts with [facility] hospitalist service who agrees to assume care of patient and bring her into the hospital for further evaluation and management.  Final Clinical Impressions(s) / ED Diagnoses   Final diagnoses:  Hyperkalemia    ED Discharge Orders    None  Renita Papa, PA-C 04/12/19 Jiles Harold, MD 04/14/19 [phone]

## 2019-04-12 NOTE — Consult Note (Signed)
Renal Service Consult Note [facility]  Lauren Warner 04/12/2019 Sol Blazing Requesting Physician:  Dr Roderic Palau  Reason for Consult:  ESRD pt w/ back pain , seizure in ED HPI: The patient is a 53 y.o. year-old with DM1, PE, home O2, HTN, HL, severe depression, ESRD on HD recently dc'd on 11/3 after admit for N/V, back pain /flank pain, w/u was essentially negative, got better.  Also had agitation at night, got HD while here. Presented today c/o back pain.  In ED had a seizure I witnessed. No seizure meds on home list.  Asked to see for ESRD.    Denies any CP, SOB, abd pain, n/v/d, L AKA no issues.   ROS  denies CP  no joint pain   no HA  no blurry vision  no rash  no diarrhea  no nausea/ vomiting     Past Medical History  Past Medical History:  Diagnosis Date  . Anemia   . Anxiety   . Asthma   . CHF (congestive heart failure) ([place])   . Coronary artery disease   . Daily headache   . Depression   . ESRD (end stage renal disease) on dialysis ([place])    "[facility]; TTS" (10/19/2017)  . High cholesterol   . History of blood transfusion 10/19/2017   "anemia"  . Hyperkalemia 10/2017  . Hypertension   . On home oxygen therapy    "2L; daily" (10/19/2017)  . Pneumonia    "several times" (10/19/2017)  . Pulmonary embolism ([place]) 2012  . Type 1 diabetes mellitus ([place])    Past Surgical History  Past Surgical History:  Procedure Laterality Date  . AV FISTULA PLACEMENT Left 2018  . CESAREAN SECTION  1989  . CORONARY ANGIOPLASTY WITH STENT PLACEMENT  ~ 08/2017   "3 stents" (10/19/2017)  . ENDOMETRIAL ABLATION  ~ 2011   Family History  Family History  Problem Relation Age of Onset  . Stroke Sister    Social History  reports that she has never smoked. She has never used smokeless tobacco. She reports that she does not drink alcohol or use drugs. Allergies  Allergies  Allergen Reactions  . Adhesive [Tape] Other (See Comments)    "RIPS MY SKIN," SO PLEASE USE  COBAN WRAP!!  . Other Rash    "RIPS MY SKIN," SO PLEASE USE COBAN WRAP!! "RIPS MY SKIN," SO PLEASE USE COBAN WRAP!!  . Hydrocodone-Acetaminophen Other (See Comments)    "Makes me feel crazy"  . Metformin And Related Other (See Comments)    "Makes me feel crazy"   Home medications Prior to Admission medications   Medication Sig Start Date End Date Taking? Authorizing Provider  albuterol (PROAIR HFA) 108 (90 Base) MCG/ACT inhaler Inhale 2 puffs into the lungs every 6 (six) hours as needed for wheezing or shortness of breath. 12/04/17   Roxan Hockey, MD  aspirin EC 81 MG tablet Take 1 tablet (81 mg total) by mouth daily. With food 12/04/17   Roxan Hockey, MD  atomoxetine (STRATTERA) 40 MG capsule Take 40 mg by mouth daily.    [provider]  blood glucose meter kit and supplies KIT Dispense based on patient and insurance preference. Use up to four times daily as directed. (FOR ICD-9 250.00, 250.01). 04/08/19   Aline August, MD  Blood Pressure KIT Check blood pressure once a day till evaluated by PCP 04/08/19   Aline August, MD  calcitRIOL (ROCALTROL) 0.25 MCG capsule Take 1 capsule (0.25 mcg total) by  mouth Every Tuesday,Thursday,and Saturday with dialysis. 04/09/19   Aline August, MD  calcium acetate (PHOSLO) 667 MG capsule Take 1 capsule (667 mg total) by mouth 3 (three) times daily with meals. 04/08/19   Aline August, MD  clopidogrel (PLAVIX) 75 MG tablet Take 1 tablet (75 mg total) by mouth daily. 04/08/19   Aline August, MD  escitalopram (LEXAPRO) 10 MG tablet Take 1 tablet (10 mg total) by mouth daily. 04/08/19   Aline August, MD  gabapentin (NEURONTIN) 100 MG capsule Take 1 capsule (100 mg total) by mouth 3 (three) times daily. 04/08/19   Aline August, MD  insulin aspart (NOVOLOG FLEXPEN) 100 UNIT/ML FlexPen Inject 3 Units into the skin 3 (three) times daily with meals. 04/08/19   Aline August, MD  Insulin Pen Needle 31G X 5 MM MISC Humalog 3 units 3 times a day with  meals 04/08/19   Aline August, MD  latanoprost (XALATAN) 0.005 % ophthalmic solution Place 1 drop into both eyes at bedtime. 12/04/17   Roxan Hockey, MD  lidocaine (LIDODERM) 5 % Place 1 patch onto the skin daily. Remove & Discard patch within 12 hours or as directed by MD 04/09/19   Aline August, MD  lidocaine-prilocaine (EMLA) cream Apply 1 application topically as needed. 1 hour before dialysis 02/11/18   [provider]  methocarbamol (ROBAXIN) 500 MG tablet Take 1 tablet (500 mg total) by mouth every 6 (six) hours as needed for muscle spasms (back pain). 04/08/19   Aline August, MD  multivitamin (RENA-VIT) TABS tablet Take 1 tablet by mouth at bedtime. 04/08/19   Aline August, MD  ondansetron (ZOFRAN) 4 MG tablet Take 1 tablet (4 mg total) by mouth every 6 (six) hours as needed for nausea. 04/08/19   Aline August, MD  pantoprazole (PROTONIX) 40 MG tablet Take 1 tablet (40 mg total) by mouth daily. 04/08/19 05/08/19  Aline August, MD  QUEtiapine (SEROQUEL) 100 MG tablet Take 1 tablet (100 mg total) by mouth at bedtime. 04/08/19   Aline August, MD  sevelamer carbonate (RENVELA) 800 MG tablet Take 3 tablets (2,400 mg total) by mouth 3 (three) times daily with meals. 04/08/19 05/08/19  Aline August, MD  sodium zirconium cyclosilicate (LOKELMA) 10 g PACK packet Take 10 g by mouth daily. 04/09/19 05/09/19  Aline August, MD  traMADol (ULTRAM) 50 MG tablet Take 1 tablet (50 mg total) by mouth every 8 (eight) hours as needed for moderate pain. 04/08/19   Aline August, MD  traZODone (DESYREL) 50 MG tablet Take 1 tablet (50 mg total) by mouth at bedtime as needed for sleep. 04/08/19   Aline August, MD   Liver Function Tests Recent Labs  Lab 04/06/19 1107 04/08/19 0210 04/12/19 1148  AST 10* 13* 13*  ALT _0 ALKPHOS 81 74 90  BILITOT 0.8 0.7 1.3*  PROT 6.6 6.2* 7.6  ALBUMIN 3.3* 2.8* 3.6   Recent Labs  Lab 04/05/19 1509 04/06/19 1107 04/12/19 1148  LIPASE 101* 71* 90*   AMYLASE  --  177*  --    CBC Recent Labs  Lab 04/07/19 0604 04/08/19 0210 04/12/19 1148  WBC 4.6 3.9* 8.5  NEUTROABS  --  1.7 7.6  HGB 8.1* 9.5* 10.3*  HCT 25.0* 31.1* 33.3*  MCV 73.7* 76.4* 76.0*  PLT 126* 213 734   Basic Metabolic Panel Recent Labs  Lab 04/05/19 1509 04/06/19 1107 04/07/19 0604 04/08/19 0210 04/12/19 1148  NA 140 140 138 137 136  K 4.4 5.7* 7.1* 4.1 6.2*  CL 96* 96* 93* 96* 96*  CO2 23 19* 23 27 20*  GLUCOSE 245* 295* 191* 71 300*  BUN 101* 128* 141* 44* 87*  CREATININE 8.91* 10.71* 12.09* 6.33* 12.92*  CALCIUM 7.9* 8.0* 8.2* 8.3* 8.7*   Iron/TIBC/Ferritin/ %Sat    Component Value Date/Time   IRON 37 03/14/2019 0805   TIBC 185 (L) 03/14/2019 0805   FERRITIN 1,095 (H) 03/14/2019 0805   IRONPCTSAT 20 03/14/2019 0805    Vitals:   04/11/19 2303 04/12/19 0251 04/12/19 0943 04/12/19 1117  BP: (!) 155/90 138/84 (!) 161/112 (!) 228/122  Pulse: 100 90 (!) 113 (!) 109  Resp: _0 Temp: 98.4 F (36.9 C) 98 F (36.7 C) (!) 97.5 F (36.4 C) 98 F (36.7 C)  TempSrc: Oral Oral Oral Oral  SpO2: 100% 96% 99% 100%    Exam Gen pt is lethargic, some myoclonic twitching, slurred speech, c/o back pain lying flat on end of stretcher No rash, cyanosis or gangrene Sclera anicteric, throat clear  No jvd or bruit Chest occ rales at bases, mostly clear RRR no MRG Abd soft ntnd no mass or ascites +bs obese GU defer MS no joint effusions or deformity Ext L AKA wound intact, no LE or stump edema, no UE edema Neuro is conversant, looks uncomfortable and some slurred speech and twitching, then had a seizure tonic-clonic in the room while I was talking to her, looked like a real seizure, dr zammit there too, post ictal now    Home meds:  - aspirin 81/ clopidogrel 75 qd  - tramadol 50 tid prn/ trazodone 50 hs/ quetiapine 100 hs/ gabapentin 100 tid/ atomoxetine 40 qd/ escitalopram 10  - insulin aspart 3u tid  - pantoprazole 40 qd  - sevelamer carb/  calc acetate ac    [facility]   4h  84kg  2/2   L AVF   Heparin none   L AVF  - mircera 75 not started yet  - calc 0.25 tiw   cXR - bilat infiltrates c/w edema  BP 228/122  K+ 6.2  BUN 87  Cr 12.9    Assessment/ Plan: 1. Back pain - per primary 2. Seizure -tonic clonic in ED, lasted about 45 min, got IM Ativan. Not on sz meds at home. B/Cr not sig elevated.  3. ESRD - TTS HD. Missed Saturday. Plan HD today upstairs.  4. Pulm edema - by CXR, not real symptomatic, suspect vol overload. Rx w/ HD.  5. Severe depression - other psych issues, seen by OP psych, mult meds 6. DM2 - per primary 7. Anemia ckd - Hb 10, will follow      Kelly Splinter  MD 04/12/2019, 1:30 PM

## 2019-04-13 DIAGNOSIS — E875 Hyperkalemia: Secondary | ICD-10-CM

## 2019-04-13 DIAGNOSIS — R000 Tachycardia, unspecified: Secondary | ICD-10-CM

## 2019-04-13 DIAGNOSIS — R569 Unspecified convulsions: Secondary | ICD-10-CM

## 2019-04-13 DIAGNOSIS — M549 Dorsalgia, unspecified: Secondary | ICD-10-CM

## 2019-04-13 DIAGNOSIS — N186 End stage renal disease: Secondary | ICD-10-CM

## 2019-04-13 DIAGNOSIS — E1069 Type 1 diabetes mellitus with other specified complication: Secondary | ICD-10-CM

## 2019-04-13 LAB — BASIC METABOLIC PANEL WITH GFR
Anion gap: 15 (ref 5–15)
BUN: 26 mg/dL — ABNORMAL HIGH (ref 6–20)
CO2: 26 mmol/L (ref 22–32)
Calcium: 8.5 mg/dL — ABNORMAL LOW (ref 8.9–10.3)
Chloride: 96 mmol/L — ABNORMAL LOW (ref 98–111)
Creatinine, Ser: 6.42 mg/dL — ABNORMAL HIGH (ref 0.44–1.00)
GFR calc Af Amer: 8 mL/min — ABNORMAL LOW
GFR calc non Af Amer: 7 mL/min — ABNORMAL LOW
Glucose, Bld: 165 mg/dL — ABNORMAL HIGH (ref 70–99)
Potassium: 3.6 mmol/L (ref 3.5–5.1)
Sodium: 137 mmol/L (ref 135–145)

## 2019-04-13 LAB — GLUCOSE, CAPILLARY
Glucose-Capillary: 67 mg/dL — ABNORMAL LOW (ref 70–99)
Glucose-Capillary: 110 mg/dL — ABNORMAL HIGH (ref 70–99)
Glucose-Capillary: 119 mg/dL — ABNORMAL HIGH (ref 70–99)

## 2019-04-13 MED ORDER — METHOCARBAMOL 500 MG PO TABS
500.0000 mg | ORAL_TABLET | Freq: Three times a day (TID) | ORAL | Status: DC | PRN
  Administered 2019-04-13 – 2019-04-15 (×5): 500 mg via ORAL
  Filled 2019-04-13 (×5): qty 1

## 2019-04-13 MED ORDER — SODIUM CHLORIDE 0.9% FLUSH
3.0000 mL | Freq: Two times a day (BID) | INTRAVENOUS | Status: DC
  Administered 2019-04-13 – 2019-04-15 (×5): 3 mL via INTRAVENOUS

## 2019-04-13 MED ORDER — INSULIN ASPART 100 UNIT/ML ~~LOC~~ SOLN
3.0000 [IU] | Freq: Three times a day (TID) | SUBCUTANEOUS | Status: DC
  Administered 2019-04-14 – 2019-04-15 (×3): 3 [IU] via SUBCUTANEOUS

## 2019-04-13 MED ORDER — CLOPIDOGREL BISULFATE 75 MG PO TABS
75.0000 mg | ORAL_TABLET | Freq: Every day | ORAL | Status: DC
  Administered 2019-04-14 – 2019-04-15 (×2): 75 mg via ORAL
  Filled 2019-04-13 (×3): qty 1

## 2019-04-13 MED ORDER — ONDANSETRON HCL 4 MG PO TABS: 4.0000 mg | ORAL_TABLET | Freq: Four times a day (QID) | ORAL | Status: DC | PRN

## 2019-04-13 MED ORDER — PANTOPRAZOLE SODIUM 40 MG PO TBEC: 40.0000 mg | DELAYED_RELEASE_TABLET | Freq: Every day | ORAL | Status: DC

## 2019-04-13 MED ORDER — ONDANSETRON HCL 4 MG/2ML IJ SOLN: 4.0000 mg | Freq: Four times a day (QID) | INTRAMUSCULAR | Status: DC | PRN

## 2019-04-13 MED ORDER — CALCITRIOL 0.25 MCG PO CAPS
0.2500 ug | ORAL_CAPSULE | ORAL | Status: DC
  Administered 2019-04-14 (×2): 0.25 ug via ORAL
  Filled 2019-04-13: qty 1

## 2019-04-13 MED ORDER — LATANOPROST 0.005 % OP SOLN
1.0000 [drp] | Freq: Every day | OPHTHALMIC | Status: DC
  Administered 2019-04-13 – 2019-04-14 (×3): 1 [drp] via OPHTHALMIC
  Filled 2019-04-13: qty 2.5

## 2019-04-13 MED ORDER — SEVELAMER CARBONATE 800 MG PO TABS
2400.0000 mg | ORAL_TABLET | Freq: Three times a day (TID) | ORAL | Status: DC
  Administered 2019-04-13 – 2019-04-15 (×6): 2400 mg via ORAL
  Filled 2019-04-13 (×7): qty 3

## 2019-04-13 MED ORDER — ALBUTEROL SULFATE (2.5 MG/3ML) 0.083% IN NEBU: 3.0000 mL | INHALATION_SOLUTION | Freq: Four times a day (QID) | RESPIRATORY_TRACT | Status: DC | PRN

## 2019-04-13 MED ORDER — SODIUM CHLORIDE 0.9% FLUSH: 3.0000 mL | INTRAVENOUS | Status: DC | PRN

## 2019-04-13 MED ORDER — RENA-VITE PO TABS
1.0000 | ORAL_TABLET | Freq: Every day | ORAL | Status: DC
  Administered 2019-04-13 – 2019-04-14 (×2): 1 via ORAL
  Filled 2019-04-13 (×3): qty 1

## 2019-04-13 MED ORDER — CHLORHEXIDINE GLUCONATE CLOTH 2 % EX PADS: 6.0000 | MEDICATED_PAD | Freq: Every day | CUTANEOUS | Status: DC

## 2019-04-13 MED ORDER — CALCIUM ACETATE (PHOS BINDER) 667 MG PO CAPS
667.0000 mg | ORAL_CAPSULE | Freq: Three times a day (TID) | ORAL | Status: DC
  Administered 2019-04-13 – 2019-04-15 (×6): 667 mg via ORAL
  Filled 2019-04-13 (×7): qty 1

## 2019-04-13 MED ORDER — ATOMOXETINE HCL 40 MG PO CAPS
40.0000 mg | ORAL_CAPSULE | Freq: Every day | ORAL | Status: DC
  Administered 2019-04-14 – 2019-04-15 (×2): 40 mg via ORAL
  Filled 2019-04-13 (×3): qty 1

## 2019-04-13 MED ORDER — GABAPENTIN 100 MG PO CAPS
100.0000 mg | ORAL_CAPSULE | Freq: Three times a day (TID) | ORAL | Status: DC
  Filled 2019-04-13: qty 1

## 2019-04-13 MED ORDER — INSULIN ASPART 100 UNIT/ML ~~LOC~~ SOLN
0.0000 [IU] | Freq: Every day | SUBCUTANEOUS | Status: DC
  Administered 2019-04-14: 2 [IU] via SUBCUTANEOUS

## 2019-04-13 MED ORDER — PANTOPRAZOLE SODIUM 40 MG PO TBEC
40.0000 mg | DELAYED_RELEASE_TABLET | Freq: Every day | ORAL | Status: DC
  Administered 2019-04-14 – 2019-04-15 (×2): 40 mg via ORAL
  Filled 2019-04-13 (×3): qty 1

## 2019-04-13 MED ORDER — QUETIAPINE FUMARATE 25 MG PO TABS
100.0000 mg | ORAL_TABLET | Freq: Every day | ORAL | Status: DC
  Administered 2019-04-13 – 2019-04-14 (×2): 100 mg via ORAL
  Filled 2019-04-13 (×3): qty 4

## 2019-04-13 MED ORDER — SODIUM CHLORIDE 0.9 % IV SOLN: 250.0000 mL | INTRAVENOUS | Status: DC | PRN

## 2019-04-13 MED ORDER — TRAMADOL HCL 50 MG PO TABS
50.0000 mg | ORAL_TABLET | Freq: Two times a day (BID) | ORAL | Status: DC | PRN
  Administered 2019-04-13 – 2019-04-15 (×3): 50 mg via ORAL
  Filled 2019-04-13 (×3): qty 1

## 2019-04-13 MED ORDER — METOPROLOL TARTRATE 5 MG/5ML IV SOLN
5.0000 mg | Freq: Four times a day (QID) | INTRAVENOUS | Status: DC | PRN
  Administered 2019-04-13: 5 mg via INTRAVENOUS
  Filled 2019-04-13: qty 5

## 2019-04-13 MED ORDER — ASPIRIN EC 81 MG PO TBEC
81.0000 mg | DELAYED_RELEASE_TABLET | Freq: Every day | ORAL | Status: DC
  Administered 2019-04-14 – 2019-04-15 (×2): 81 mg via ORAL
  Filled 2019-04-13 (×3): qty 1

## 2019-04-13 MED ORDER — TRAZODONE HCL 50 MG PO TABS: 50.0000 mg | ORAL_TABLET | Freq: Every evening | ORAL | Status: DC | PRN

## 2019-04-13 MED ORDER — ENOXAPARIN SODIUM 30 MG/0.3ML ~~LOC~~ SOLN
30.0000 mg | SUBCUTANEOUS | Status: DC
  Administered 2019-04-13 – 2019-04-15 (×3): 30 mg via SUBCUTANEOUS
  Filled 2019-04-13 (×3): qty 0.3

## 2019-04-13 MED ORDER — INSULIN ASPART 100 UNIT/ML ~~LOC~~ SOLN
0.0000 [IU] | Freq: Three times a day (TID) | SUBCUTANEOUS | Status: DC
  Administered 2019-04-13 – 2019-04-15 (×3): 1 [IU] via SUBCUTANEOUS
  Administered 2019-04-15: 2 [IU] via SUBCUTANEOUS

## 2019-04-13 NOTE — Progress Notes (Addendum)
[facility] Progress Note  Subjective: pt feeling better, no new c/o  Vitals:   04/13/19 0438 04/13/19 0440 04/13/19 0452 04/13/19 0517  BP:    (!) 152/79  Pulse: 87 88  88  Resp: 12 12 17 13   Temp:      TempSrc:      SpO2: 100% 100%  100%  Weight:        Inpatient medications: . aspirin EC  81 mg Oral Daily  . atomoxetine  40 mg Oral Daily  . [START ON 04/14/2019] calcitRIOL  0.25 mcg Oral Q T,Th,Sa-HD  . calcium acetate  667 mg Oral TID WC  . Chlorhexidine Gluconate Cloth  6 each Topical Q0600  . clopidogrel  75 mg Oral Daily  . enoxaparin (LOVENOX) injection  30 mg Subcutaneous Q24H  . insulin aspart  0-5 Units Subcutaneous QHS  . insulin aspart  0-9 Units Subcutaneous TID WC  . insulin aspart  3 Units Subcutaneous TID WC  . latanoprost  1 drop Both Eyes QHS  . lidocaine  2 patch Transdermal Q24H  . multivitamin  1 tablet Oral QHS  . pantoprazole  40 mg Oral Q0600  . QUEtiapine  100 mg Oral QHS  . sevelamer carbonate  2,400 mg Oral TID WC  . sodium chloride flush  3 mL Intravenous Q12H   . sodium chloride     sodium chloride, albuterol, labetalol, metoprolol tartrate, ondansetron **OR** ondansetron (ZOFRAN) IV, sodium chloride flush, traMADol, traZODone    Exam: Gen pt is lethargic, some myoclonic twitching, slurred speech, c/o back pain lying flat on end of stretcher No rash, cyanosis or gangrene Sclera anicteric, throat clear  No jvd or bruit Chest clear bilat RRR no MRG Abd soft ntnd no mass or ascites +bs obese Ext L AKA wound intact, no LE or stump edema Neuro no asterixis, Ox 3, nonfocal    Home meds:  - aspirin 81/ clopidogrel 75 qd  - tramadol 50 tid prn/ trazodone 50 hs/ quetiapine 100 hs/ gabapentin 100 tid/ atomoxetine 40 qd/ escitalopram 10  - insulin aspart 3u tid  - pantoprazole 40 qd  - sevelamer carb/ calc acetate ac    [facility]   4h  84kg  2/2   L AVF   Heparin none   L AVF  - mircera 75 not started yet  - calc 0.25 tiw   CXR - bilat infiltrates c/w edema  BP 228/122  K+ 6.2  BUN 87  Cr 12.9    Assessment/ Plan: 1. Seizure - x1 in ED, poss d/t uremia, no new meds for now per primary.  2. ESRD - [facility] HD. Missed Saturday. HD yest, plan HD tomorrow.  3. Pulm edema - on CXR, asymptomatic. Repeat today. 4L off on HD yest w high BP's and no drop. Needs further vol reduction tomorrow.  4. Severe depression - other psych issues, seen by OP psych, mult meds 5. DM2 - per primary 6. Anemia ckd - Hb 10, will follow      Rob Toleen [place] 04/13/2019, 1:19 PM  Iron/TIBC/Ferritin/ %Sat    Component Value Date/Time   IRON 37 03/14/2019 0805   TIBC 185 (L) 03/14/2019 0805   FERRITIN 1,095 (H) 03/14/2019 0805   IRONPCTSAT 20 03/14/2019 0805   Recent Labs  Lab 04/12/19 1148 04/13/19 0216  NA 136 137  K 6.2* 3.6  CL 96* 96*  CO2 20* 26  GLUCOSE 300* 165*  BUN 87* 26*  CREATININE 12.92* 6.42*  CALCIUM 8.7* 8.5*  ALBUMIN 3.6  --    Recent Labs  Lab 04/12/19 1148  AST 13*  ALT 17  ALKPHOS 90  BILITOT 1.3*  PROT 7.6   Recent Labs  Lab 04/12/19 1148  WBC 8.5  HGB 10.3*  HCT 33.3*  PLT 269

## 2019-04-13 NOTE — TOC Initial Note (Addendum)
Transition of Care [facility]) - Initial/Assessment Note    Patient Details  Name: Lauren Warner MRN: [id number] Date of Birth: Mar 02, 1966  Transition of Care [facility] CM/SW Contact:    Ralph Dowdy, [place] Work Phone Number: 04/13/2019, 11:03 AM  Clinical Narrative:                 CSW spoke with pt about her current housing situation. Pt stated that the motel that she was staying in was extended until Monday, November 16th. Pt stated that she is still using [employer],  Home oxygen. Pt is obtaining resources through the [employer] and through [facility]. Pt also stated to have assistance through her church. Pt stated that she has a job through [employer]. Pt stated that she will be going to her motel when she discharges and will need a taxi voucher. CSW will continue to follow up as needed. Pt will go to her new home next week after Monday, November 16th ([place]). CSW will call [facility] program to notify that the pt is at the hospital.  Expected Discharge Plan: [place] Barriers to Discharge: Continued Medical Work up   Patient Goals and CMS Choice Patient states their goals for this hospitalization and ongoing recovery are:: Get stronger enough to move into new housing. CMS Medicare.gov Compare Post Acute Care list provided to:: Patient Choice offered to / list presented to : Patient  Expected Discharge Plan and Services Expected Discharge Plan: [place] In-house Referral: Clinical Social Work Discharge Planning Services: CM Consult Post Acute Care Choice: [place] arrangements for the past 2 months: Hotel/Motel                 DME Arranged: N/A DME Agency: NA       HH Arranged: PT, OT [place] Agency: [place] ([facility]) Date HH Agency Contacted: 04/13/19 Time HH Agency Contacted: [phone]    Prior Living Arrangements/Services Living arrangements for the past 2 months: Hotel/Motel Lives with::  Self Patient language and need for interpreter reviewed:: Yes Do you feel safe going back to the place where you live?: Yes      Need for Family Participation in Patient Care: Yes (Comment) Care giver support system in place?: Yes (comment) Current home services: Home PT, Home OT Criminal Activity/Legal Involvement Pertinent to Current Situation/Hospitalization: No - Comment as needed  Activities of Daily Living      Permission Sought/Granted Permission sought to share information with : Case Manager Permission granted to share information with : Yes, Verbal Permission Granted              Emotional Assessment Appearance:: Appears older than stated age Attitude/Demeanor/Rapport: Gracious Affect (typically observed): Accepting Orientation: : Oriented to Self, Oriented to Place, Oriented to  Time, Oriented to Situation Alcohol / Substance Use: Not Applicable Psych Involvement: No (comment)  Admission diagnosis:  Hyperkalemia [E87.5] Seizure [facility]) [R56.9] Patient Active Problem List   Diagnosis Date Noted  . Seizures ([place]) 04/12/2019  . Seizure ([place]) 04/12/2019  . Abdominal pain 04/06/2019  . Left below-knee amputee ([place]) 03/24/2019  . Major depressive disorder, recurrent episode, moderate ([place]) 03/11/2019  . Acute on chronic diastolic (congestive) heart failure ([place]) 03/10/2019  . Encephalopathy, metabolic 123456  . Acute on chronic congestive heart failure ([place])   . Noncompliance with renal dialysis ([place])   . MDD (major depressive disorder), single episode, severe , no psychosis ([place])   . SOB (shortness of  breath) 01/22/2018  . Acute encephalopathy 01/07/2018  . Suicidal ideation 01/07/2018  . Hypertensive encephalopathy 12/23/2017  . Hypertensive urgency 12/22/2017  . Syncope and collapse 12/03/2017  . Hyperkalemia 11/10/2017  . Pressure injury of skin 10/20/2017  . Essential hypertension 10/19/2017  . CAD (coronary artery disease) 10/19/2017  . ESRD on  dialysis ([place]) 10/19/2017  . Syncope 10/19/2017  . Anemia due to end stage renal disease ([place]) 10/19/2017  . Closed left ankle fracture 10/19/2017  . Nausea vomiting and diarrhea 10/19/2017  . DM (diabetes mellitus), type 2 with renal complications ([place]) 123456  . Acute respiratory failure with hypoxia ([place]) 10/18/2017   PCP:  [name], [name] Pharmacy:   [facility] [place] [address] [place] [address] Phone: [phone] Fax: [place], [place] [address] Phone: [phone] Fax: [place], [place] [address] [place] [address] Phone: [phone] Fax: [phone]     Social Determinants of Health (SDOH) Interventions    Readmission Risk Interventions Readmission Risk Prevention Plan 04/08/2019  Transportation Screening Complete  Medication Review (Between) Complete  PCP or Specialist appointment within 3-5 days of discharge Complete  HRI or [place] Complete  SW Recovery Care/Counseling Consult Complete  [place] Not Applicable

## 2019-04-13 NOTE — Progress Notes (Signed)
Patients monitor showed a low respiratory rate. This RN and charge nurse in room to assess patient. Patient responds to name being called. Patient in no respiratory distress. Patient able to follow commands, patient is confused, which is patients baseline since arriving to unit. Will continue to monitor.

## 2019-04-13 NOTE — Progress Notes (Signed)
Lauren Warner is a 53 y.o. female patient admitted from ED awake, alert - orientated  X 2 - no acute distress noted.  VSS - Blood pressure 135/78, pulse 83, temperature 98.4 F (36.9 C), temperature source Axillary, resp. rate 17, weight 84.6 kg, SpO2 92 %.    IV in place, occlusive dsg intact without redness.  Orientation to room, and floor completed with information packet given to patient/family.  Patient declined safety video at this time.  Admission INP armband ID verified with patient/family, and in place.   SR up x 2, fall assessment complete, with patient and family able to verbalize understanding of risk associated with falls, and verbalized understanding to call nsg before up out of bed.  Call light within reach, patient able to voice, and demonstrate understanding.  Skin, clean-dry- intact without evidence of bruising, or skin tears.   No evidence of skin break down noted on exam.     Will cont to eval and treat per MD orders.  [place], RN 04/13/2019 4:53 AM

## 2019-04-13 NOTE — Progress Notes (Signed)
PROGRESS NOTE    Lauren Warner  [id number] DOB: [birth year] DOA: 04/11/2019 PCP: [name], [name]   Brief Narrative:  HPI On 04/12/2019 by Dr. Merton Border Lauren Warner  is a 53 y.o. female, with past medical history significant for end-stage renal disease on hemodialysis who was brought for evaluation today for back pain.  Patient was admitted on 11/3 due to intractable nausea and vomiting and was discharged after she was improved.  Work-up was essentially negative.  Today she came originally complaining of generalized pain, persistent, mainly in her back. Patient is in hemodialysis TTS and she missed dialysis the last 2 sessions and is presenting today with the above complaint in addition to nausea, vomiting and shortness of breath. Upon examination the patient could not give any history she was totally confused after she received Ativan for witnessed seizure in the emergency room.  No history of seizures was noted in her past medical history.  There was no phone to talk to her family when I was evaluating her.  Patient could not give me a history of seizures. CT scan of the head in the emergency room was negative.  COVID-19 is pending .  Blood work in the emergency room showed uremia with elevated creatinine and elevated potassium for which she received Lokelma.   Assessment & Plan   Seizure -Noted to have tonic-clonic seizure in the ED lasting approximately 45 minutes -Patient received IM Ativan -Currently not on seizure medications at home -Suspect secondary to missed dialysis and electrolyte abnormalities -Admitting physician discussed with neurology, Dr. Lorraine Lax, seizure likely happened in the setting electrolyte abnormalities and severe uremia.  Continue Ativan PRN and consult neurology if another episode occurs.   ESRD -Patient dialyzes on Tuesday, Thursday, Saturday -Patient missed Saturday HD -Status post HD on 04/12/2019 -Nephrology consulted and appreciated   Back pain -Patient tells me that it is not necessarily pain but more of spasms that occur in her entire back.  She feels that they are due to dialysis. -Continue pain control and lidocaine patches  Hyperkalemia -Secondary to missed dialysis -Potassium 6.2 on admission, currently 3.6 -Continue HD and Lokelma  Diabetes mellitus, type I -Continue insulin sliding scale CBG monitoring  Chronic diastolic heart failure -Echoardiogram May 2019 showed grade 2 diastolic dysfunction -Currently compensated and euvolemic -Continue volume control with HD  Severe depression/psych issues -Patient is followed by outpatient psychiatry -Continue home medication  Anemia of chronic disease -Stable, continue monitor CBC  Sinus tachycardia -Possibly due to pain, obtain to EKG  DVT Prophylaxis Lovenox  Code Status: Full  Family Communication: None at bedside  Disposition Plan: Admitted.  Continue to monitor for additional 24 hours of his mental status has not returned back to full baseline.  Consultants Neurology via phone, Dr. Lorraine Lax Nephrology  Procedures  None  Antibiotics   Anti-infectives (From admission, onward)   None      Subjective:   Lauren Warner seen and examined today.  Patient continues to cry out in pain specifically back pain.  Feels that she has back spasms, that worsen with dialysis.  Denies current chest pain, shortness of breath, abdominal pain, nausea vomiting, diarrhea or constipation.  Patient does fall back asleep when not spoken to.    Objective:   Vitals:   04/13/19 0438 04/13/19 0440 04/13/19 0452 04/13/19 0517  BP:    (!) 152/79  Pulse: 87 88  88  Resp: 12 12 17 13   Temp:      TempSrc:  SpO2: 100% 100%  100%  Weight:        Intake/Output Summary (Last 24 hours) at 04/13/2019 1121 Last data filed at 04/12/2019 1930 Gross per 24 hour  Intake -  Output 4000 ml  Net -4000 ml   Filed Weights   04/12/19 2108  Weight: 84.6 kg    Exam   General: Well developed, well nourished, mild distress  HEENT: NCAT, mucous membranes moist.   Cardiovascular: S1 S2 auscultated, +SEM, tachycardic   Respiratory: Clear to auscultation bilaterally  Abdomen: Soft, nontender, nondistended, + bowel sounds  Extremities: warm dry without cyanosis clubbing or edema of RLE. L AKA  Neuro: AAOx3, slow to respond, nonfocal  Psych: flat, tearful   Data Reviewed: I have personally reviewed following labs and imaging studies  CBC: Recent Labs  Lab 04/07/19 0604 04/08/19 0210 04/12/19 1148  WBC 4.6 3.9* 8.5  NEUTROABS  --  1.7 7.6  HGB 8.1* 9.5* 10.3*  HCT 25.0* 31.1* 33.3*  MCV 73.7* 76.4* 76.0*  PLT 126* 213 [id number]   Basic Metabolic Panel: Recent Labs  Lab 04/07/19 0604 04/08/19 0210 04/12/19 1148 04/13/19 0216  NA 138 137 136 137  K 7.1* 4.1 6.2* 3.6  CL 93* 96* 96* 96*  CO2 23 27 20* 26  GLUCOSE 191* 71 300* 165*  BUN 141* 44* 87* 26*  CREATININE 12.09* 6.33* 12.92* 6.42*  CALCIUM 8.2* 8.3* 8.7* 8.5*   GFR: Estimated Creatinine Clearance: 11.6 mL/min (A) (by C-G formula based on SCr of 6.42 mg/dL (H)). Liver Function Tests: Recent Labs  Lab 04/08/19 0210 04/12/19 1148  AST 13* 13*  ALT 18 17  ALKPHOS 74 90  BILITOT 0.7 1.3*  PROT 6.2* 7.6  ALBUMIN 2.8* 3.6   Recent Labs  Lab 04/12/19 1148  LIPASE 90*   No results for input(s): AMMONIA in the last 168 hours. Coagulation Profile: No results for input(s): INR, PROTIME in the last 168 hours. Cardiac Enzymes: No results for input(s): CKTOTAL, CKMB, CKMBINDEX, TROPONINI in the last 168 hours. BNP (last 3 results) No results for input(s): PROBNP in the last 8760 hours. HbA1C: No results for input(s): HGBA1C in the last 72 hours. CBG: Recent Labs  Lab 04/08/19 1202 04/08/19 1236 04/08/19 1311 04/12/19 1442 04/12/19 2121  GLUCAP 48* 68* 140* 263* 136*   Lipid Profile: No results for input(s): CHOL, HDL, LDLCALC, TRIG, CHOLHDL, LDLDIRECT in the last 72  hours. Thyroid Function Tests: No results for input(s): TSH, T4TOTAL, FREET4, T3FREE, THYROIDAB in the last 72 hours. Anemia Panel: No results for input(s): VITAMINB12, FOLATE, FERRITIN, TIBC, IRON, RETICCTPCT in the last 72 hours. Urine analysis:    Component Value Date/Time   COLORURINE YELLOW 04/12/2019 1430   APPEARANCEUR CLEAR 04/12/2019 1430   LABSPEC 1.012 04/12/2019 1430   PHURINE 8.0 04/12/2019 1430   GLUCOSEU >=500 (A) 04/12/2019 1430   HGBUR NEGATIVE 04/12/2019 1430   BILIRUBINUR NEGATIVE 04/12/2019 1430   KETONESUR NEGATIVE 04/12/2019 1430   PROTEINUR >=300 (A) 04/12/2019 1430   NITRITE NEGATIVE 04/12/2019 1430   LEUKOCYTESUR NEGATIVE 04/12/2019 1430   Sepsis Labs: @LABRCNTIP (procalcitonin:4,lacticidven:4)  ) Recent Results (from the past 240 hour(s))  SARS CORONAVIRUS 2 (TAT 6-24 HRS) Nasopharyngeal Nasopharyngeal Swab     Status: None   Collection Time: 04/06/19  6:33 AM   Specimen: Nasopharyngeal Swab  Result Value Ref Range Status   SARS Coronavirus 2 NEGATIVE NEGATIVE Final    Comment: (NOTE) SARS-CoV-2 target nucleic acids are NOT DETECTED. The SARS-CoV-2 RNA is generally detectable  in upper and lower respiratory specimens during the acute phase of infection. Negative results do not preclude SARS-CoV-2 infection, do not rule out co-infections with other pathogens, and should not be used as the sole basis for treatment or other patient management decisions. Negative results must be combined with clinical observations, patient history, and epidemiological information. The expected result is Negative. Fact Sheet for Patients: SugarRoll.be Fact Sheet for Healthcare Providers: https://www.woods-mathews.com/ This test is not yet approved or cleared by the [place] FDA and  has been authorized for detection and/or diagnosis of SARS-CoV-2 by FDA under an Emergency Use Authorization (EUA). This EUA will remain  in  effect (meaning this test can be used) for the duration of the COVID-19 declaration under Section 56 4(b)(1) of the Act, 21 U.S.C. section 360bbb-3(b)(1), unless the authorization is terminated or revoked sooner. Performed at [facility], [place] [address] [place], [place] [address]   SARS CORONAVIRUS 2 (TAT 6-24 HRS) Nasopharyngeal Nasopharyngeal Swab     Status: None   Collection Time: 04/12/19  2:46 PM   Specimen: Nasopharyngeal Swab  Result Value Ref Range Status   SARS Coronavirus 2 NEGATIVE NEGATIVE Final    Comment: (NOTE) SARS-CoV-2 target nucleic acids are NOT DETECTED. The SARS-CoV-2 RNA is generally detectable in upper and lower respiratory specimens during the acute phase of infection. Negative results do not preclude SARS-CoV-2 infection, do not rule out co-infections with other pathogens, and should not be used as the sole basis for treatment or other patient management decisions. Negative results must be combined with clinical observations, patient history, and epidemiological information. The expected result is Negative. Fact Sheet for Patients: SugarRoll.be Fact Sheet for Healthcare Providers: https://www.woods-mathews.com/ This test is not yet approved or cleared by the [place] FDA and  has been authorized for detection and/or diagnosis of SARS-CoV-2 by FDA under an Emergency Use Authorization (EUA). This EUA will remain  in effect (meaning this test can be used) for the duration of the COVID-19 declaration under Section 56 4(b)(1) of the Act, 21 U.S.C. section 360bbb-3(b)(1), unless the authorization is terminated or revoked sooner. Performed at [facility], [place] [address], [place], [place] [address]       Radiology Studies: Ct Head Wo Contrast  Result Date: 04/12/2019 CLINICAL DATA:  Seizure EXAM: CT HEAD WITHOUT CONTRAST TECHNIQUE: Contiguous axial images were obtained from the base of the  skull through the vertex without intravenous contrast. COMPARISON:  2019 FINDINGS: Brain: There is no acute intracranial hemorrhage, mass-effect, or edema. Gray-white differentiation is preserved. There is no extra-axial fluid collection. Ventricles and sulci are within normal limits in size and configuration. Vascular: There is atherosclerotic calcification at the skull base. Skull: Calvarium is unremarkable. Sinuses/Orbits: No acute finding. Other: None. IMPRESSION: No acute intracranial hemorrhage, mass effect, or evidence of acute infarction. Electronically Signed   By: Macy Mis M.D.   On: 04/12/2019 14:21   Dg Chest Portable 1 View  Result Date: 04/12/2019 CLINICAL DATA:  Shortness of breath EXAM: PORTABLE CHEST 1 VIEW COMPARISON:  March 09, 2019 FINDINGS: Likely chronic interstitial prominence with mild superimposed interstitial edema. Left basilar atelectasis or consolidation. Stable cardiomediastinal silhouette. IMPRESSION: Mild interstitial edema and left basilar atelectasis/consolidation. Shows basilar atelectasis and consolidation Electronically Signed   By: Macy Mis M.D.   On: 04/12/2019 09:50     Scheduled Meds: . aspirin EC  81 mg Oral Daily  . atomoxetine  40 mg Oral Daily  . [START ON 04/14/2019] calcitRIOL  0.25 mcg Oral Q T,Th,Sa-HD  . calcium acetate  667 mg Oral TID WC  . Chlorhexidine Gluconate Cloth  6 each Topical Q0600  . clopidogrel  75 mg Oral Daily  . enoxaparin (LOVENOX) injection  30 mg Subcutaneous Q24H  . gabapentin  100 mg Oral TID  . insulin aspart  0-5 Units Subcutaneous QHS  . insulin aspart  0-9 Units Subcutaneous TID WC  . insulin aspart  3 Units Subcutaneous TID WC  . latanoprost  1 drop Both Eyes QHS  . lidocaine  2 patch Transdermal Q24H  . multivitamin  1 tablet Oral QHS  . pantoprazole  40 mg Oral Q0600  . QUEtiapine  100 mg Oral QHS  . sevelamer carbonate  2,400 mg Oral TID WC  . sodium chloride flush  3 mL Intravenous Q12H    Continuous Infusions: . sodium chloride       LOS: 1 day   Time Spent in minutes   45 minutes  Merl Guardino D.O. on 04/13/2019 at 11:21 AM  Between 7am to 7pm - Please see pager noted on [email or web address]  After 7pm go to [email or web address]  And look for the night coverage person covering for me after hours  [facility] Office  [phone]

## 2019-04-13 NOTE — Progress Notes (Signed)
Renal Navigator aware of admission and ongoing housing issues and high risk for readmissions. Renal Navigator reviewed TOC CSW/N. Rayyan notes from admissions last week and spoke with CM/A. Cole covering patient today.  Renal Navigator met with patient, CM and Social Work Secretary/administrator to discuss ongoing housing concerns. Patient states she has to be out of the [employer] tomorrow, 04/14/19 and still needs a form signed and emailed back to Ms. Morris with the [facility] program who is supposed to be securing funding for her house on [address]. Renal Navigator notes that this was completed and emailed to Ms. Moore according to [facility] note on 04/08/19. CM called Ms. Laurance Flatten today and was told she needs paperwork signed by patient and emailed to her, as fax is not available. Renal Navigator can scan and email. She stated she would send the form via email within 15 minutes and then CM would follow up regarding the remainder of the process of when house would be available to patient, as Ms. Laurance Flatten stated she was unable to state at this time. Patient was understandably getting emotional and upset regarding this process. Renal Navigator encouraged her to stat calm and take deep breaths as getting worked up will not benefit her or the situation. As of 4:45pm, per CM/A. Landry Mellow, she had not received the document from Ms. Moore and she will follow up again tomorrow.  Most likely, though medically ready for discharge, patient will need to receive HD treatment in the hospital on 11/12 since disposition is still being worked on. Renal Navigator will follow up with CM in the am of 11/12 to continue working on behalf of patient.  Alphonzo Cruise, [place] Renal Navigator [phone]

## 2019-04-14 LAB — CBC
HCT: 31.4 % — ABNORMAL LOW (ref 36.0–46.0)
Hemoglobin: 9.5 g/dL — ABNORMAL LOW (ref 12.0–15.0)
MCH: 23.6 pg — ABNORMAL LOW (ref 26.0–34.0)
MCHC: 30.3 g/dL (ref 30.0–36.0)
MCV: 78.1 fL — ABNORMAL LOW (ref 80.0–100.0)
Platelets: 283 10*3/uL (ref 150–400)
RBC: 4.02 MIL/uL (ref 3.87–5.11)
RDW: 17.6 % — ABNORMAL HIGH (ref 11.5–15.5)
WBC: 4.7 10*3/uL (ref 4.0–10.5)
nRBC: 0 % (ref 0.0–0.2)

## 2019-04-14 LAB — RENAL FUNCTION PANEL
Albumin: 3.1 g/dL — ABNORMAL LOW (ref 3.5–5.0)
Anion gap: 18 — ABNORMAL HIGH (ref 5–15)
BUN: 37 mg/dL — ABNORMAL HIGH (ref 6–20)
CO2: 24 mmol/L (ref 22–32)
Calcium: 8.7 mg/dL — ABNORMAL LOW (ref 8.9–10.3)
Chloride: 95 mmol/L — ABNORMAL LOW (ref 98–111)
Creatinine, Ser: 8.45 mg/dL — ABNORMAL HIGH (ref 0.44–1.00)
GFR calc Af Amer: 6 mL/min — ABNORMAL LOW
GFR calc non Af Amer: 5 mL/min — ABNORMAL LOW
Glucose, Bld: 96 mg/dL (ref 70–99)
Phosphorus: 7.8 mg/dL — ABNORMAL HIGH (ref 2.5–4.6)
Potassium: 4.6 mmol/L (ref 3.5–5.1)
Sodium: 137 mmol/L (ref 135–145)

## 2019-04-14 LAB — GLUCOSE, CAPILLARY
Glucose-Capillary: 97 mg/dL (ref 70–99)
Glucose-Capillary: 144 mg/dL — ABNORMAL HIGH (ref 70–99)
Glucose-Capillary: 223 mg/dL — ABNORMAL HIGH (ref 70–99)

## 2019-04-14 LAB — HEMOGLOBIN AND HEMATOCRIT, BLOOD
HCT: 30.5 % — ABNORMAL LOW (ref 36.0–46.0)
Hemoglobin: 9.3 g/dL — ABNORMAL LOW (ref 12.0–15.0)

## 2019-04-14 LAB — HEPATITIS B SURFACE ANTIGEN: Hepatitis B Surface Ag: NONREACTIVE

## 2019-04-14 MED ORDER — CALCITRIOL 0.25 MCG PO CAPS
ORAL_CAPSULE | ORAL | Status: AC
  Administered 2019-04-14: 0.25 ug via ORAL
  Filled 2019-04-14: qty 1

## 2019-04-14 MED ORDER — TRAMADOL HCL 50 MG PO TABS
ORAL_TABLET | ORAL | Status: AC
  Administered 2019-04-14: 50 mg via ORAL
  Filled 2019-04-14: qty 1

## 2019-04-14 MED ORDER — DARBEPOETIN ALFA 40 MCG/0.4ML IJ SOSY: 40.0000 ug | PREFILLED_SYRINGE | INTRAMUSCULAR | Status: DC

## 2019-04-14 NOTE — Progress Notes (Signed)
PT Cancellation Note  Patient Details Name: Lauren Warner MRN: [id number] DOB: 08-15-1965   Cancelled Treatment:    Reason Eval/Treat Not Completed: Other (comment) PT attempted to see patient x 2 over the span of one hour; pt screaming into phone on both occasions concerning housing.   Ellamae Sia, PT, DPT Acute Rehabilitation Services Pager [phone] Office [phone]    Willy Eddy 04/14/2019, 4:19 PM

## 2019-04-14 NOTE — Progress Notes (Addendum)
NCM spoke with pt @ bedside to discuss d/c plan. PTA  [employer] afforded pt housing @ [employer] til 11/11. Pt states unable to afford continued stay @ [employer] once d/c. MD aware of housing issue ... homeless.  NCM spoke with  [name] / [employer] requesting  Mot[employer] extension and was declined. States pt has already received  an extension and they could no longer assist. [name] stated pt gets a check monthly.  Pt awaiting approval for housing with [facility]. However, pt has no furniture for home.  [employer]  referral / application for furniture hasn't been initiated. NCM submitted signed forms completed by pt for housing eligibility to [facility] .... forms were submitted to [facility] @ [phone]. Sherrie informed NCM approval process can take up to 2 business days. Sherrie indicated pt will need money for deposit and 1st month rent with approval. Pt states [name], her church and  [employer] to assist with meeting monies required for housing.  PT evaluation pending ... TOC  will continue to monitor. Whitman Hero RN,BSN,CM

## 2019-04-14 NOTE — Progress Notes (Signed)
[facility] Progress Note   Subjective:   Seen in HD. Complains of persistent pain, primarily in her back. Denies SOB, CP, palpitations and dizziness. Appears to be tolerating treatment thus far.   Objective Vitals:   04/14/19 1000 04/14/19 1030 04/14/19 1100 04/14/19 1130  BP: 103/60 115/79 (!) 144/75 (!) 128/45  Pulse: 64 94 92 92  Resp:      Temp:      TempSrc:      SpO2:      Weight:       Physical Exam General: Well developed female, alert and in NAD Heart: RRR, no murmurs, rubs or gallops Lungs: CTA b/l without wheezing, rhonchi or rales Abdomen: Soft, moderately diffusely tender to palpation. Nondistended. +BS Extremities: No peripheral edema Dialysis Access:  LU AVF cannulated  Additional Objective Labs: Basic Metabolic Panel: Recent Labs  Lab 04/12/19 1148 04/13/19 0216 04/14/19 0226  NA 136 137 137  K 6.2* 3.6 4.6  CL 96* 96* 95*  CO2 20* 26 24  GLUCOSE 300* 165* 96  BUN 87* 26* 37*  CREATININE 12.92* 6.42* 8.45*  CALCIUM 8.7* 8.5* 8.7*  PHOS  --   --  7.8*   Liver Function Tests: Recent Labs  Lab 04/08/19 0210 04/12/19 1148 04/14/19 0226  AST 13* 13*  --   ALT 18 17  --   ALKPHOS 74 90  --   BILITOT 0.7 1.3*  --   PROT 6.2* 7.6  --   ALBUMIN 2.8* 3.6 3.1*   Recent Labs  Lab 04/12/19 1148  LIPASE 90*   CBC: Recent Labs  Lab 04/08/19 0210 04/12/19 1148 04/14/19 0226 04/14/19 0853  WBC 3.9* 8.5  --  4.7  NEUTROABS 1.7 7.6  --   --   HGB 9.5* 10.3* 9.3* 9.5*  HCT 31.1* 33.3* 30.5* 31.4*  MCV 76.4* 76.0*  --  78.1*  PLT 213 269  --  283   Blood Culture    Component Value Date/Time   SDES URINE, CLEAN CATCH 01/08/2018 1132   SPECREQUEST  01/08/2018 1132    NONE Performed at [facility], [place] [address], [place], [place] [address]    CULT MULTIPLE SPECIES PRESENT, SUGGEST RECOLLECTION (A) 01/08/2018 1132   REPTSTATUS 01/09/2018 FINAL 01/08/2018 1132    CBG: Recent Labs  Lab 04/12/19 2121 04/13/19 1310  04/13/19 1617 04/13/19 2125 04/14/19 0747  GLUCAP 136* 67* 110* 119* 97    Studies/Results: Ct Head Wo Contrast  Result Date: 04/12/2019 CLINICAL DATA:  Seizure EXAM: CT HEAD WITHOUT CONTRAST TECHNIQUE: Contiguous axial images were obtained from the base of the skull through the vertex without intravenous contrast. COMPARISON:  2019 FINDINGS: Brain: There is no acute intracranial hemorrhage, mass-effect, or edema. Gray-white differentiation is preserved. There is no extra-axial fluid collection. Ventricles and sulci are within normal limits in size and configuration. Vascular: There is atherosclerotic calcification at the skull base. Skull: Calvarium is unremarkable. Sinuses/Orbits: No acute finding. Other: None. IMPRESSION: No acute intracranial hemorrhage, mass effect, or evidence of acute infarction. Electronically Signed   By: Macy Mis M.D.   On: 04/12/2019 14:21   Medications: . sodium chloride     . aspirin EC  81 mg Oral Daily  . atomoxetine  40 mg Oral Daily  . calcitRIOL  0.25 mcg Oral Q T,Th,Sa-HD  . calcium acetate  667 mg Oral TID WC  . Chlorhexidine Gluconate Cloth  6 each Topical Q0600  . clopidogrel  75 mg Oral Daily  .  enoxaparin (LOVENOX) injection  30 mg Subcutaneous Q24H  . insulin aspart  0-5 Units Subcutaneous QHS  . insulin aspart  0-9 Units Subcutaneous TID WC  . insulin aspart  3 Units Subcutaneous TID WC  . latanoprost  1 drop Both Eyes QHS  . lidocaine  2 patch Transdermal Q24H  . multivitamin  1 tablet Oral QHS  . pantoprazole  40 mg Oral Q0600  . QUEtiapine  100 mg Oral QHS  . sevelamer carbonate  2,400 mg Oral TID WC  . sodium chloride flush  3 mL Intravenous Q12H    Dialysis Orders: [facility]  4h 84kg 2/2 L AVF Heparin none L AVF - mircera 75 not started yet - calc 0.25 tiw  CXR - bilat infiltrates c/w edema BP 228/122 K+ 6.2 BUN 87 Cr 12.9  Assessment/Plan: 1. Seizure - x1 in ED, received IM Ativan. Neurology suspected  seizure was due to uremia/ electrolyte abnormalities, no new meds for now per primary.  2. ESRD - TTS HD. Missed Saturday. BUN improved to 37. Continue TTS schedule.  3. HTN/Volume - pulmonary edema on CXR initially, asymptomatic. Weight 84.6kg after HD on 11/10, goal 2.5L with HD today. BP controlled.  4. Severe depression - Followed by psychiatry outpatient. On multiple medications. 5. DM2 - per primary 6. Anemia ckd - Hb 9.5-  Has not started mircera yet, appears ESA was to be started on 11/7 but did not go to outpatient HD. Will order aranesp. 7. Metabolic bone disease: Corrected calcium 9.4. Phos 7.8- continue calcitriol and renvela, follow labs.   Anice Paganini, PA-C 04/14/2019, 1:25 PM  [employer] Pager: [phone]

## 2019-04-14 NOTE — Progress Notes (Signed)
PROGRESS NOTE    Lauren Warner  [id number] DOB: 1966-02-23 DOA: 04/11/2019 PCP: [name], [name]   Brief Narrative:  HPI On 04/12/2019 by Dr. Merton Border Lauren Warner  is a 53 y.o. female, with past medical history significant for end-stage renal disease on hemodialysis who was brought for evaluation today for back pain.  Patient was admitted on 11/3 due to intractable nausea and vomiting and was discharged after she was improved.  Work-up was essentially negative.   Today she came originally complaining of generalized pain, persistent, mainly in her back. Patient is in hemodialysis TTS and she missed dialysis the last 2 sessions and is presenting today with the above complaint in addition to nausea, vomiting and shortness of breath. Upon examination the patient could not give any history she was totally confused after she received Ativan for witnessed seizure in the emergency room.  No history of seizures was noted in her past medical history.  There was no phone to talk to her family when I was evaluating her.  Patient could not give me a history of seizures. CT scan of the head in the emergency room was negative.  COVID-19 is pending .  Blood work in the emergency room showed uremia with elevated creatinine and elevated potassium for which she received Lokelma.   Assessment & Plan   Seizure -Noted to have tonic-clonic seizure in the ED lasting approximately 45 minutes -Patient received IM Ativan -Currently not on seizure medications at home -Suspect secondary to missed dialysis and electrolyte abnormalities -Admitting physician discussed with neurology, Dr. Lorraine Lax, seizure likely happened in the setting electrolyte abnormalities and severe uremia.  Continue Ativan PRN and consult neurology if another episode occurs.   ESRD -Patient dialyzes on Tuesday, Thursday, Saturday -Patient missed Saturday HD -Status post HD on 04/12/2019 -Nephrology consulted and appreciated,  dialyzing today  Back pain -Patient tells me that it is not necessarily pain but more of spasms that occur in her entire back.  She feels that they are due to dialysis. -Continue pain control and lidocaine patches  Hyperkalemia -Secondary to missed dialysis -Potassium 6.2 on admission, currently 4.6 -Continue HD and Lokelma  Diabetes mellitus, type I -Continue insulin sliding scale CBG monitoring  Chronic diastolic heart failure -Echoardiogram May 2019 showed grade 2 diastolic dysfunction -Currently compensated and euvolemic -Continue volume control with HD  Severe depression/psych issues -Patient is followed by outpatient psychiatry -Continue home medication  Anemia of chronic disease -Stable, currently 9.5, continue monitor CBC  Sinus tachycardia -Possibly due to pain, resolved  DVT Prophylaxis Lovenox  Code Status: Full  Family Communication: None at bedside  Disposition Plan: Admitted.  Case management and renal coordinator consulted as patient has ongoing housing issues.   Consultants Neurology via phone, Dr. Lorraine Lax Nephrology  Procedures  None  Antibiotics   Anti-infectives (From admission, onward)   None      Subjective:   Sintia Greenly seen and examined today.  Continues to have some pain (although needed to awoken from sleep). Denies chest pain, shortness of breath, abdominal pain, N/V/D/C.  Objective:   Vitals:   04/14/19 1000 04/14/19 1030 04/14/19 1100 04/14/19 1130  BP: 103/60 115/79 (!) 144/75 (!) 128/45  Pulse: 64 94 92 92  Resp:      Temp:      TempSrc:      SpO2:      Weight:        Intake/Output Summary (Last 24 hours) at 04/14/2019 1247 Last data filed at 04/13/2019  2136 Gross per 24 hour  Intake 3 ml  Output -  Net 3 ml   Filed Weights   04/12/19 2108  Weight: 84.6 kg   Exam  General: Well developed, well nourished, NAD, appears stated age  52: NCAT, mucous membranes moist.   Cardiovascular: S1 S2 auscultated,  SEM, RRR  Respiratory: Clear to auscultation bilaterally with equal chest rise  Abdomen: Soft, nontender, nondistended, + bowel sounds  Extremities: warm dry without cyanosis clubbing or edema of RLE. L AKA  Neuro: AAOx3, nonfocal  Data Reviewed: I have personally reviewed following labs and imaging studies  CBC: Recent Labs  Lab 04/08/19 0210 04/12/19 1148 04/14/19 0226 04/14/19 0853  WBC 3.9* 8.5  --  4.7  NEUTROABS 1.7 7.6  --   --   HGB 9.5* 10.3* 9.3* 9.5*  HCT 31.1* 33.3* 30.5* 31.4*  MCV 76.4* 76.0*  --  78.1*  PLT 213 269  --  [id number]   Basic Metabolic Panel: Recent Labs  Lab 04/08/19 0210 04/12/19 1148 04/13/19 0216 04/14/19 0226  NA 137 136 137 137  K 4.1 6.2* 3.6 4.6  CL 96* 96* 96* 95*  CO2 27 20* 26 24  GLUCOSE 71 300* 165* 96  BUN 44* 87* 26* 37*  CREATININE 6.33* 12.92* 6.42* 8.45*  CALCIUM 8.3* 8.7* 8.5* 8.7*  PHOS  --   --   --  7.8*   GFR: Estimated Creatinine Clearance: 8.8 mL/min (A) (by C-G formula based on SCr of 8.45 mg/dL (H)). Liver Function Tests: Recent Labs  Lab 04/08/19 0210 04/12/19 1148 04/14/19 0226  AST 13* 13*  --   ALT 18 17  --   ALKPHOS 74 90  --   BILITOT 0.7 1.3*  --   PROT 6.2* 7.6  --   ALBUMIN 2.8* 3.6 3.1*   Recent Labs  Lab 04/12/19 1148  LIPASE 90*   No results for input(s): AMMONIA in the last 168 hours. Coagulation Profile: No results for input(s): INR, PROTIME in the last 168 hours. Cardiac Enzymes: No results for input(s): CKTOTAL, CKMB, CKMBINDEX, TROPONINI in the last 168 hours. BNP (last 3 results) No results for input(s): PROBNP in the last 8760 hours. HbA1C: No results for input(s): HGBA1C in the last 72 hours. CBG: Recent Labs  Lab 04/12/19 2121 04/13/19 1310 04/13/19 1617 04/13/19 2125 04/14/19 0747  GLUCAP 136* 67* 110* 119* 97   Lipid Profile: No results for input(s): CHOL, HDL, LDLCALC, TRIG, CHOLHDL, LDLDIRECT in the last 72 hours. Thyroid Function Tests: No results for  input(s): TSH, T4TOTAL, FREET4, T3FREE, THYROIDAB in the last 72 hours. Anemia Panel: No results for input(s): VITAMINB12, FOLATE, FERRITIN, TIBC, IRON, RETICCTPCT in the last 72 hours. Urine analysis:    Component Value Date/Time   COLORURINE YELLOW 04/12/2019 1430   APPEARANCEUR CLEAR 04/12/2019 1430   LABSPEC 1.012 04/12/2019 1430   PHURINE 8.0 04/12/2019 1430   GLUCOSEU >=500 (A) 04/12/2019 1430   HGBUR NEGATIVE 04/12/2019 1430   BILIRUBINUR NEGATIVE 04/12/2019 1430   KETONESUR NEGATIVE 04/12/2019 1430   PROTEINUR >=300 (A) 04/12/2019 1430   NITRITE NEGATIVE 04/12/2019 1430   LEUKOCYTESUR NEGATIVE 04/12/2019 1430   Sepsis Labs: @LABRCNTIP (procalcitonin:4,lacticidven:4)  ) Recent Results (from the past 240 hour(s))  SARS CORONAVIRUS 2 (TAT 6-24 HRS) Nasopharyngeal Nasopharyngeal Swab     Status: None   Collection Time: 04/06/19  6:33 AM   Specimen: Nasopharyngeal Swab  Result Value Ref Range Status   SARS Coronavirus 2 NEGATIVE NEGATIVE Final  Comment: (NOTE) SARS-CoV-2 target nucleic acids are NOT DETECTED. The SARS-CoV-2 RNA is generally detectable in upper and lower respiratory specimens during the acute phase of infection. Negative results do not preclude SARS-CoV-2 infection, do not rule out co-infections with other pathogens, and should not be used as the sole basis for treatment or other patient management decisions. Negative results must be combined with clinical observations, patient history, and epidemiological information. The expected result is Negative. Fact Sheet for Patients: SugarRoll.be Fact Sheet for Healthcare Providers: https://www.woods-mathews.com/ This test is not yet approved or cleared by the [place] FDA and  has been authorized for detection and/or diagnosis of SARS-CoV-2 by FDA under an Emergency Use Authorization (EUA). This EUA will remain  in effect (meaning this test can be used) for the  duration of the COVID-19 declaration under Section 56 4(b)(1) of the Act, 21 U.S.C. section 360bbb-3(b)(1), unless the authorization is terminated or revoked sooner. Performed at [facility], [place] [address], [place], [place] [address]   SARS CORONAVIRUS 2 (TAT 6-24 HRS) Nasopharyngeal Nasopharyngeal Swab     Status: None   Collection Time: 04/12/19  2:46 PM   Specimen: Nasopharyngeal Swab  Result Value Ref Range Status   SARS Coronavirus 2 NEGATIVE NEGATIVE Final    Comment: (NOTE) SARS-CoV-2 target nucleic acids are NOT DETECTED. The SARS-CoV-2 RNA is generally detectable in upper and lower respiratory specimens during the acute phase of infection. Negative results do not preclude SARS-CoV-2 infection, do not rule out co-infections with other pathogens, and should not be used as the sole basis for treatment or other patient management decisions. Negative results must be combined with clinical observations, patient history, and epidemiological information. The expected result is Negative. Fact Sheet for Patients: SugarRoll.be Fact Sheet for Healthcare Providers: https://www.woods-mathews.com/ This test is not yet approved or cleared by the [place] FDA and  has been authorized for detection and/or diagnosis of SARS-CoV-2 by FDA under an Emergency Use Authorization (EUA). This EUA will remain  in effect (meaning this test can be used) for the duration of the COVID-19 declaration under Section 56 4(b)(1) of the Act, 21 U.S.C. section 360bbb-3(b)(1), unless the authorization is terminated or revoked sooner. Performed at [facility], [place] [address], [place], [place] [address]       Radiology Studies: Ct Head Wo Contrast  Result Date: 04/12/2019 CLINICAL DATA:  Seizure EXAM: CT HEAD WITHOUT CONTRAST TECHNIQUE: Contiguous axial images were obtained from the base of the skull through the vertex without intravenous  contrast. COMPARISON:  2019 FINDINGS: Brain: There is no acute intracranial hemorrhage, mass-effect, or edema. Gray-white differentiation is preserved. There is no extra-axial fluid collection. Ventricles and sulci are within normal limits in size and configuration. Vascular: There is atherosclerotic calcification at the skull base. Skull: Calvarium is unremarkable. Sinuses/Orbits: No acute finding. Other: None. IMPRESSION: No acute intracranial hemorrhage, mass effect, or evidence of acute infarction. Electronically Signed   By: Macy Mis M.D.   On: 04/12/2019 14:21     Scheduled Meds: . aspirin EC  81 mg Oral Daily  . atomoxetine  40 mg Oral Daily  . calcitRIOL  0.25 mcg Oral Q T,Th,Sa-HD  . calcium acetate  667 mg Oral TID WC  . Chlorhexidine Gluconate Cloth  6 each Topical Q0600  . clopidogrel  75 mg Oral Daily  . enoxaparin (LOVENOX) injection  30 mg Subcutaneous Q24H  . insulin aspart  0-5 Units Subcutaneous QHS  . insulin aspart  0-9 Units Subcutaneous  TID WC  . insulin aspart  3 Units Subcutaneous TID WC  . latanoprost  1 drop Both Eyes QHS  . lidocaine  2 patch Transdermal Q24H  . multivitamin  1 tablet Oral QHS  . pantoprazole  40 mg Oral Q0600  . QUEtiapine  100 mg Oral QHS  . sevelamer carbonate  2,400 mg Oral TID WC  . sodium chloride flush  3 mL Intravenous Q12H   Continuous Infusions: . sodium chloride       LOS: 2 days   Time Spent in minutes   45 minutes  Latron Ribas D.O. on 04/14/2019 at 12:47 PM  Between 7am to 7pm - Please see pager noted on [email or web address]  After 7pm go to [email or web address]  And look for the night coverage person covering for me after hours  [facility] Office  [phone]

## 2019-04-14 NOTE — Progress Notes (Signed)
Visited Ms. Lauren Warner per RN page. She was experiencing a lot of anxiety. Listened to her experience and prayed with her by her request. Patient rested after prayer. Will continue to provide spiritual care as needed.  Rev. Bevington.

## 2019-04-15 DIAGNOSIS — E1169 Type 2 diabetes mellitus with other specified complication: Secondary | ICD-10-CM

## 2019-04-15 LAB — GLUCOSE, CAPILLARY
Glucose-Capillary: 144 mg/dL — ABNORMAL HIGH (ref 70–99)
Glucose-Capillary: 188 mg/dL — ABNORMAL HIGH (ref 70–99)

## 2019-04-15 MED ORDER — METHOCARBAMOL 500 MG PO TABS: 500.0000 mg | ORAL_TABLET | Freq: Three times a day (TID) | ORAL | 0 refills | Status: AC | PRN

## 2019-04-15 MED ORDER — TRAMADOL HCL 50 MG PO TABS: 50.0000 mg | ORAL_TABLET | Freq: Two times a day (BID) | ORAL | 0 refills | Status: DC | PRN

## 2019-04-15 MED ORDER — TRAMADOL HCL 50 MG PO TABS: 50.0000 mg | ORAL_TABLET | Freq: Two times a day (BID) | ORAL | 0 refills | Status: AC | PRN
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
## 2019-04-15 NOTE — Discharge Summary (Addendum)
Physician Discharge Summary  Lauren Warner MRN:[id number] DOB: 02/04/1966 DOA: 04/11/2019  PCP: Talbott, Denise Lorraine, [name]  Admit date: 04/11/2019 Discharge date: 04/15/2019  Time spent: 45 minutes  Recommendations for Outpatient Follow-up:  Patient will be discharged to home with home health services.  Patient will need to follow up with primary care provider within one week of discharge.  Continue hemodialysis as scheduled. Patient should continue medications as prescribed.  Patient should follow a carb modified renal diet with 1200ml fluid restriction.  Discharge Diagnoses:  Seizure ESRD Back pain Hyperkalemia Diabetes mellitus, type I Chronic diastolic heart failure Severe depression/psych issues Anemia of chronic disease Sinus tachycardia  Discharge Condition: Stable  Diet recommendation: carb modified/ renal diet with fluid restriction  Filed Weights   04/12/19 2108 04/14/19 0825 04/14/19 1203  Weight: 84.6 kg 83.6 kg 81.5 kg    History of present illness:  On 04/12/2019 by Dr. Ali Hijazi LaurenWarneris a52 y.o.female,with past medical history significant for end-stage renal disease on hemodialysis who was brought for evaluation today for back pain. Patient was admitted on 11/3 due to intractable nausea and vomiting and was discharged after she was improved. Work-up was essentially negative.  Today she came originally complaining of generalized pain, persistent, mainly in her back. Patient is in hemodialysis TTS and she missed dialysis the last 2 sessions and is presenting today with the above complaint in addition to nausea, vomiting and shortness of breath. Upon examination the patient could not give any history she was totally confused after she received Ativan for witnessed seizure in the emergency room. No history of seizures was noted in her past medical history. There was no phone to talk to her family when I was evaluating her. Patient could not give  me a history of seizures. CT scan of the head in the emergency room was negative. COVID-19 is pending .Blood work in the emergency room showed uremia with elevated creatinine and elevated potassium for which she received Lokelma.  Hospital Course:  Seizure -Noted to have tonic-clonic seizure in the ED lasting approximately 45 minutes -Patient received IM Ativan -Currently not on seizure medications at home -Suspect secondary to missed dialysis and electrolyte abnormalities -Admitting physician discussed with neurology, Dr. Aroor, seizure likely happened in the setting electrolyte abnormalities and severe uremia.  Continue Ativan PRN and consult neurology if another episode occurs.  -No further seizure activity during hospitalization  ESRD -Patient dialyzes on Tuesday, Thursday, Saturday -Patient missed Saturday HD -Nephrology consulted and appreciated  Back pain -Patient tells me that it is not necessarily pain but more of spasms that occur in her entire back.  She feels that they are due to dialysis. -Continue pain control- tramadol and lidocaine patches -script sent to pharmacy for 10 pills (enough for 5 days) until patient can see her PCP -PT and OT recommended HH  Hyperkalemia -Secondary to missed dialysis -Potassium 6.2 on admission, currently 4.6 -Continue HD and Lokelma  Diabetes mellitus, type I -Continue insulin sliding scale CBG monitoring  Chronic diastolic heart failure -Echoardiogram May 2019 showed grade 2 diastolic dysfunction -Currently compensated and euvolemic -Continue volume control with HD  Severe depression/psych issues -Patient is followed by outpatient psychiatry -Continue home medication  Anemia of chronic disease -Stable, currently 9.5, continue monitor CBC  Sinus tachycardia -Possibly due to pain, resolved  Consultants Neurology via phone, Dr. Aroor Nephrology  Procedures  None  Discharge Exam: Vitals:   04/15/19 0800  04/15/19 1153  BP: (!) 164/81 118/79  Pulse: 81 94    Resp: 14 18  Temp: 97.8 F (36.6 C) 98.3 F (36.8 C)  SpO2: 100% 100%     General: Well developed, well nourished, NAD, appears stated age  HEENT: NCAT, mucous membranes moist.  Cardiovascular: S1 S2 auscultated, RRR, SEM  Respiratory: Clear to auscultation bilaterally  Abdomen: Soft, nontender, nondistended, + bowel sounds  Extremities: warm dry without cyanosis clubbing or edema of RLE. L AKA  Neuro: AAOx3, nonfocal  Discharge Instructions Discharge Instructions    Discharge instructions   Complete by: As directed    Patient will be discharged to home with home health services.  Patient will need to follow up with primary care provider within one week of discharge.  Continue hemodialysis as scheduled. Patient should continue medications as prescribed.  Patient should follow a carb modified renal diet with 1200ml fluid restriction.     Allergies as of 04/15/2019      Reactions   Adhesive [tape] Other (See Comments)   "RIPS MY SKIN," SO PLEASE USE COBAN WRAP!!   Other Rash   "RIPS MY SKIN," SO PLEASE USE COBAN WRAP!! "RIPS MY SKIN," SO PLEASE USE COBAN WRAP!!   Hydrocodone-acetaminophen Other (See Comments)   "Makes me feel crazy"   Metformin And Related Other (See Comments)   "Makes me feel crazy"      Medication List    TAKE these medications   albuterol 108 (90 Base) MCG/ACT inhaler Commonly known as: ProAir HFA Inhale 2 puffs into the lungs every 6 (six) hours as needed for wheezing or shortness of breath.   aspirin EC 81 MG tablet Take 1 tablet (81 mg total) by mouth daily. With food   atomoxetine 40 MG capsule Commonly known as: STRATTERA Take 40 mg by mouth daily.   blood glucose meter kit and supplies Kit Dispense based on patient and insurance preference. Use up to four times daily as directed. (FOR ICD-9 250.00, 250.01).   Blood Pressure Kit Check blood pressure once a day till evaluated by  PCP   calcitRIOL 0.25 MCG capsule Commonly known as: ROCALTROL Take 1 capsule (0.25 mcg total) by mouth Every Tuesday,Thursday,and Saturday with dialysis.   calcium acetate 667 MG capsule Commonly known as: PHOSLO Take 1 capsule (667 mg total) by mouth 3 (three) times daily with meals.   clopidogrel 75 MG tablet Commonly known as: PLAVIX Take 1 tablet (75 mg total) by mouth daily.   escitalopram 10 MG tablet Commonly known as: LEXAPRO Take 1 tablet (10 mg total) by mouth daily.   gabapentin 100 MG capsule Commonly known as: NEURONTIN Take 1 capsule (100 mg total) by mouth 3 (three) times daily.   Insulin Pen Needle 31G X 5 MM Misc Humalog 3 units 3 times a day with meals   latanoprost 0.005 % ophthalmic solution Commonly known as: XALATAN Place 1 drop into both eyes at bedtime.   lidocaine 5 % Commonly known as: LIDODERM Place 1 patch onto the skin daily. Remove & Discard patch within 12 hours or as directed by MD   lidocaine-prilocaine cream Commonly known as: EMLA Apply 1 application topically as needed. 1 hour before dialysis   methocarbamol 500 MG tablet Commonly known as: ROBAXIN Take 1 tablet (500 mg total) by mouth every 6 (six) hours as needed for muscle spasms (back pain). What changed: Another medication with the same name was added. Make sure you understand how and when to take each.   methocarbamol 500 MG tablet Commonly known as: ROBAXIN Take 1 tablet (500 mg   total) by mouth every 8 (eight) hours as needed for muscle spasms. What changed: You were already taking a medication with the same name, and this prescription was added. Make sure you understand how and when to take each.   multivitamin Tabs tablet Take 1 tablet by mouth at bedtime.   NovoLOG FlexPen 100 UNIT/ML FlexPen Generic drug: insulin aspart Inject 3 Units into the skin 3 (three) times daily with meals.   ondansetron 4 MG tablet Commonly known as: ZOFRAN Take 1 tablet (4 mg total) by  mouth every 6 (six) hours as needed for nausea.   pantoprazole 40 MG tablet Commonly known as: Protonix Take 1 tablet (40 mg total) by mouth daily.   QUEtiapine 100 MG tablet Commonly known as: SEROQUEL Take 1 tablet (100 mg total) by mouth at bedtime.   sevelamer carbonate 800 MG tablet Commonly known as: RENVELA Take 3 tablets (2,400 mg total) by mouth 3 (three) times daily with meals.   sodium zirconium cyclosilicate 10 g Pack packet Commonly known as: LOKELMA Take 10 g by mouth daily.   traMADol 50 MG tablet Commonly known as: ULTRAM Take 1 tablet (50 mg total) by mouth every 12 (twelve) hours as needed for moderate pain. What changed: when to take this   traZODone 50 MG tablet Commonly known as: DESYREL Take 1 tablet (50 mg total) by mouth at bedtime as needed for sleep.      Allergies  Allergen Reactions  . Adhesive [Tape] Other (See Comments)    "RIPS MY SKIN," SO PLEASE USE COBAN WRAP!!  . Other Rash    "RIPS MY SKIN," SO PLEASE USE COBAN WRAP!! "RIPS MY SKIN," SO PLEASE USE COBAN WRAP!!  . Hydrocodone-Acetaminophen Other (See Comments)    "Makes me feel crazy"  . Metformin And Related Other (See Comments)    "Makes me feel crazy"   Follow-up Information    Talbott, Brennan Bailey, [place]. Schedule an appointment as soon as possible for a visit in 1 week(s).   Specialty: Internal Medicine Why: Hospital follow up Contact information: [address] [place] [address] [phone]        [facility].   Specialty: [place] Why: home health services arranged Contact information: [place] [address] [phone]            The results of significant diagnostics from this hospitalization (including imaging, microbiology, ancillary and laboratory) are listed below for reference.    Significant Diagnostic Studies: Dg Abdomen 1 View  Result Date: 04/06/2019 CLINICAL DATA:   52 year old female with nausea vomiting. EXAM: ABDOMEN - 1 VIEW COMPARISON:  CT of the abdomen pelvis dated 04/06/2019. FINDINGS: Evaluation is limited due to body habitus and soft tissue attenuation. There is no bowel dilatation or evidence of obstruction. No free air identified. Multiple echogenic structures in the right flank correspond to the gallstones. There is vascular calcification. An IVC filter is noted. The osseous structures are intact. IMPRESSION: 1. No bowel obstruction. 2. Cholelithiasis. 3. Vascular calcification. Electronically Signed   By: Anner Crete M.D.   On: 04/06/2019 16:07   Ct Head Wo Contrast  Result Date: 04/12/2019 CLINICAL DATA:  Seizure EXAM: CT HEAD WITHOUT CONTRAST TECHNIQUE: Contiguous axial images were obtained from the base of the skull through the vertex without intravenous contrast. COMPARISON:  2019 FINDINGS: Brain: There is no acute intracranial hemorrhage, mass-effect, or edema. Gray-white differentiation is preserved. There is no extra-axial fluid  collection. Ventricles and sulci are within normal limits in size and configuration. Vascular: There is atherosclerotic calcification at the skull base. Skull: Calvarium is unremarkable. Sinuses/Orbits: No acute finding. Other: None. IMPRESSION: No acute intracranial hemorrhage, mass effect, or evidence of acute infarction. Electronically Signed   By: Macy Mis M.D.   On: 04/12/2019 14:21   Ct Abdomen Pelvis W Contrast  Result Date: 04/06/2019 CLINICAL DATA:  Abdominal pain. Left-sided abdominal pain EXAM: CT ABDOMEN AND PELVIS WITH CONTRAST TECHNIQUE: Multidetector CT imaging of the abdomen and pelvis was performed using the standard protocol following bolus administration of intravenous contrast. CONTRAST:  175m OMNIPAQUE IOHEXOL 300 MG/ML  SOLN COMPARISON:  April 05, 2019 FINDINGS: Lower chest: The lung bases are clear. The heart size is normal. Hepatobiliary: The liver is normal. Cholelithiasis without  acute inflammation.There is no biliary ductal dilation. Pancreas: There are multiple small cystic lesions in the pancreatic body, some of which are stable from prior study in 2019, while others appear new. Spleen: No splenic laceration or hematoma. Adrenals/Urinary Tract: --Adrenal glands: No adrenal hemorrhage. --Right kidney/ureter: No hydronephrosis or perinephric hematoma. --Left kidney/ureter: No hydronephrosis or perinephric hematoma. --Urinary bladder: Unremarkable. Stomach/Bowel: --Stomach/Duodenum: There is wall thickening of the distal esophagus. --Small bowel: No dilatation or inflammation. --Colon: No focal abnormality. --Appendix: Not visualized. No right lower quadrant inflammation or free fluid. Vascular/Lymphatic: Atherosclerotic calcification is present within the non-aneurysmal abdominal aorta, without hemodynamically significant stenosis. There is an IVC filter in place --there mild enlarged retroperitoneal lymph nodes. --No mesenteric lymphadenopathy. --there is few prominent pelvic lymph nodes. Reproductive: There is a fibroid uterus. Other: No ascites or free air. The abdominal wall is normal. Musculoskeletal. No acute displaced fractures. IMPRESSION: 1. No acute intra-abdominal or intrapelvic process. 2. Multiple small cystic lesions in the pancreatic body, some of which are stable from prior study in 2019, while others appear new. These could be further evaluated with MRI of the abdomen with and without contrast on a nonemergent basis. 3. Cholelithiasis without acute inflammation. 4. Aortic atherosclerosis. 5. There is wall thickening of the distal esophagus. Correlate for symptoms of esophagitis. 6. Fibroid uterus. Aortic Atherosclerosis (ICD10-I70.0). Electronically Signed   By: CConstance HolsterM.D.   On: 04/06/2019 05:05   Dg Chest Portable 1 View  Result Date: 04/12/2019 CLINICAL DATA:  Shortness of breath EXAM: PORTABLE CHEST 1 VIEW COMPARISON:  March 09, 2019 FINDINGS: Likely  chronic interstitial prominence with mild superimposed interstitial edema. Left basilar atelectasis or consolidation. Stable cardiomediastinal silhouette. IMPRESSION: Mild interstitial edema and left basilar atelectasis/consolidation. Shows basilar atelectasis and consolidation Electronically Signed   By: PMacy MisM.D.   On: 04/12/2019 09:50   Ct Renal Stone Study  Result Date: 04/05/2019 CLINICAL DATA:  Left side abdominal pain, nausea, vomiting, left flank pain EXAM: CT ABDOMEN AND PELVIS WITHOUT CONTRAST TECHNIQUE: Multidetector CT imaging of the abdomen and pelvis was performed following the standard protocol without IV contrast. COMPARISON:  12/23/2017 FINDINGS: Lower chest: Cardiomegaly. Dense calcifications in the visualized right coronary artery. No acute abnormality. Hepatobiliary: Layering gallstones within the gallbladder. No focal hepatic abnormality. Pancreas: No focal abnormality or ductal dilatation. Spleen: No focal abnormality.  Normal size. Adrenals/Urinary Tract: No adrenal abnormality. No focal renal abnormality. No stones or hydronephrosis. Urinary bladder is unremarkable. Stomach/Bowel: Stomach, large and small bowel grossly unremarkable. Vascular/Lymphatic: Aortic and branch vessel atherosclerosis. No aneurysm or adenopathy. IVC filter noted. Reproductive: Calcifications in the uterus compatible with fibroids. No adnexal mass. Other: No free fluid or free air.  Musculoskeletal: No acute bony abnormality. IMPRESSION: No evidence of renal or ureteral stones.  No hydronephrosis. Coronary artery disease. Aortic atherosclerosis and branch vessel atherosclerosis. Cholelithiasis. No acute findings in the abdomen or pelvis. Electronically Signed   By: Rolm Baptise M.D.   On: 04/05/2019 22:04    Microbiology: Recent Results (from the past 240 hour(s))  SARS CORONAVIRUS 2 (TAT 6-24 HRS) Nasopharyngeal Nasopharyngeal Swab     Status: None   Collection Time: 04/06/19  6:33 AM   Specimen:  Nasopharyngeal Swab  Result Value Ref Range Status   SARS Coronavirus 2 NEGATIVE NEGATIVE Final    Comment: (NOTE) SARS-CoV-2 target nucleic acids are NOT DETECTED. The SARS-CoV-2 RNA is generally detectable in upper and lower respiratory specimens during the acute phase of infection. Negative results do not preclude SARS-CoV-2 infection, do not rule out co-infections with other pathogens, and should not be used as the sole basis for treatment or other patient management decisions. Negative results must be combined with clinical observations, patient history, and epidemiological information. The expected result is Negative. Fact Sheet for Patients: SugarRoll.be Fact Sheet for Healthcare Providers: https://www.woods-mathews.com/ This test is not yet approved or cleared by the [place] FDA and  has been authorized for detection and/or diagnosis of SARS-CoV-2 by FDA under an Emergency Use Authorization (EUA). This EUA will remain  in effect (meaning this test can be used) for the duration of the COVID-19 declaration under Section 56 4(b)(1) of the Act, 21 U.S.C. section 360bbb-3(b)(1), unless the authorization is terminated or revoked sooner. Performed at [facility], [place] [address], [place], [place] [address]   SARS CORONAVIRUS 2 (TAT 6-24 HRS) Nasopharyngeal Nasopharyngeal Swab     Status: None   Collection Time: 04/12/19  2:46 PM   Specimen: Nasopharyngeal Swab  Result Value Ref Range Status   SARS Coronavirus 2 NEGATIVE NEGATIVE Final    Comment: (NOTE) SARS-CoV-2 target nucleic acids are NOT DETECTED. The SARS-CoV-2 RNA is generally detectable in upper and lower respiratory specimens during the acute phase of infection. Negative results do not preclude SARS-CoV-2 infection, do not rule out co-infections with other pathogens, and should not be used as the sole basis for treatment or other patient management decisions.  Negative results must be combined with clinical observations, patient history, and epidemiological information. The expected result is Negative. Fact Sheet for Patients: SugarRoll.be Fact Sheet for Healthcare Providers: https://www.woods-mathews.com/ This test is not yet approved or cleared by the [place] FDA and  has been authorized for detection and/or diagnosis of SARS-CoV-2 by FDA under an Emergency Use Authorization (EUA). This EUA will remain  in effect (meaning this test can be used) for the duration of the COVID-19 declaration under Section 56 4(b)(1) of the Act, 21 U.S.C. section 360bbb-3(b)(1), unless the authorization is terminated or revoked sooner. Performed at [facility], [place] [address], [place], [place] [address]      Labs: Basic Metabolic Panel: Recent Labs  Lab 04/12/19 1148 04/13/19 0216 04/14/19 0226  NA 136 137 137  K 6.2* 3.6 4.6  CL 96* 96* 95*  CO2 20* 26 24  GLUCOSE 300* 165* 96  BUN 87* 26* 37*  CREATININE 12.92* 6.42* 8.45*  CALCIUM 8.7* 8.5* 8.7*  PHOS  --   --  7.8*   Liver Function Tests: Recent Labs  Lab 04/12/19 1148 04/14/19 0226  AST 13*  --   ALT 17  --   ALKPHOS 90  --   BILITOT 1.3*  --  PROT 7.6  --   ALBUMIN 3.6 3.1*   Recent Labs  Lab 04/12/19 1148  LIPASE 90*   No results for input(s): AMMONIA in the last 168 hours. CBC: Recent Labs  Lab 04/12/19 1148 04/14/19 0226 04/14/19 0853  WBC 8.5  --  4.7  NEUTROABS 7.6  --   --   HGB 10.3* 9.3* 9.5*  HCT 33.3* 30.5* 31.4*  MCV 76.0*  --  78.1*  PLT 269  --  283   Cardiac Enzymes: No results for input(s): CKTOTAL, CKMB, CKMBINDEX, TROPONINI in the last 168 hours. BNP: BNP (last 3 results) Recent Labs    04/17/18 1308  BNP 765.6*    ProBNP (last 3 results) No results for input(s): PROBNP in the last 8760 hours.  CBG: Recent Labs  Lab 04/14/19 0747 04/14/19 1737 04/14/19 2116 04/15/19 0759  04/15/19 1151  GLUCAP 97 144* 223* 144* 188*       Signed:     [facility] 04/15/2019, 1:41 PM   

## 2019-04-15 NOTE — TOC Transition Note (Addendum)
Transition of Care [facility]) - CM/SW Discharge Note   Patient Details  Name: Lauren Warner MRN: [id number] Date of Birth: 1965/07/15  [facility] CM/SW Contact:  Sharin Mons, RN Phone Number: 04/15/2019, 1:32 PM   Clinical Narrative:   Readmitted with generalized weakness.  Pt adamant and medically ready for d/c today. Pt states she will d/c to her home @ [place], [place]. Pt's cell # [phone]. Pt stated she longer wanted [facility] as her [facility] provider and has made agency aware.    NCM noted new HH orders. Referral made with [facility] and accepted.  Pt states she has transportation to home.   Final next level of care: [place] Barriers to Discharge: No Barriers Identified   Patient Goals and CMS Choice Patient states their goals for this hospitalization and ongoing recovery are:: Get stronger enough to move into new housing. CMS Medicare.gov Compare Post Acute Care list provided to:: Patient Choice offered to / list presented to : Patient  Discharge Placement                Patient to be transferred to facility by: [facility] Voucher      Discharge Plan and Services In-house Referral: Clinical Social Work Discharge Planning Services: CM Consult Post Acute Care Choice: (Active with [facility], PT, NA)          DME Arranged: N/A DME Agency: NA       HH Arranged: PT, Nurse's Aide, OT [place] Agency: [facility] [employer]) Date [place]: 04/15/19 Time [place]: 1330 Representative spoke with at [place]: [place] ([place]) Interventions     Readmission Risk Interventions Readmission Risk Prevention Plan 04/08/2019  Transportation Screening Complete  Medication Review Press photographer) Complete  PCP or Specialist appointment within 3-5 days of discharge Complete  HRI or [place] Complete  SW Recovery Care/Counseling Consult Complete  [place] Not Applicable
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
## 2019-04-15 NOTE — Discharge Instructions (Signed)
Hyperkalemia °Hyperkalemia is when you have too much potassium in your blood. Potassium helps your body in many ways, but having too much can cause problems. If there is too much potassium in your blood, it can affect how your heart works. °Potassium is normally removed from your body by your kidneys. Many things can cause the amount in your blood to be high. Medicines and other treatments can be used to bring the amount to a normal level. Treatment may need to be done in the hospital. °Follow these instructions at home: ° °· Take over-the-counter and prescription medicines only as told by your doctor. °· Do not take any of the following unless your doctor says it is okay: °? Supplements. °? Natural products. °? Herbs. °? Vitamins. °· Limit how much alcohol you drink as told by your doctor. °· Do not use drugs. If you need help quitting, ask your doctor. °· If you have kidney disease, you may need to follow a low-potassium diet. A food specialist (dietitian) can help you. °· Keep all follow-up visits as told by your doctor. This is important. °Contact a doctor if: °· Your heartbeat is not regular or is very slow. °· You feel dizzy (light-headed). °· You feel weak. °· You feel sick to your stomach (nauseous). °· You have tingling in your hands or feet. °· You lose feeling (have numbness) in your hands or feet. °Get help right away if: °· You are short of breath. °· You have chest pain. °· You pass out (faint). °· You cannot move your muscles. °Summary °· Hyperkalemia is when you have too much potassium in your blood. °· Take over-the-counter and prescription medicines only as told by your doctor. °· Limit how much alcohol you drink as told by your doctor. °· Contact a doctor if your heartbeat is not regular. °This information is not intended to replace advice given to you by your health care provider. Make sure you discuss any questions you have with your health care provider. °Document Released: 05/19/2005 Document  Revised: 05/04/2017 Document Reviewed: 05/04/2017 °Elsevier Patient Education © 2020 [employer] ° °

## 2019-04-15 NOTE — Care Management Important Message (Signed)
Important Message  Patient Details  Name: Lauren Warner MRN: [id number] Date of Birth: [birth year]   Medicare Important Message Given:  Yes     Berniece Abid Montine Circle 04/15/2019, 3:16 PM

## 2019-04-15 NOTE — Progress Notes (Signed)
Renal Navigator appreciates notification from CM/A. Landry Mellow of patient discharge today. Patient already gone when Renal Navigator attempted to see her. Navigator called patient, who answered, and states she is eager to get to her knew home, even though she still needs "things," such as furniture, etc.  Renal Navigator reminded patient that she needs to change her address with [facility]. She had forgotten. Renal Navigator offered to do this for her and she was appreciative. Renal Navigator has created a Standing Order with [facility] with a pick up window at her address: [place] between 10:45am-11:15am to OP HD/East. She will then be picked up between 5:15pm-5:45pm at [place] to be taken back home. Patient and Clinic Social Worker notified.  Alphonzo Cruise, [place] Renal Navigator [phone]

## 2019-04-15 NOTE — Progress Notes (Signed)
Subjective:  Thinks she may go home today, no Cos  Today , tolerated HD yest    Objective Vital signs in last 24 hours: Vitals:   04/15/19 0322 04/15/19 0600 04/15/19 0800 04/15/19 1153  BP: 90/60 125/67 (!) 164/81 118/79  Pulse:  77 81 94  Resp: 16 15 14 18   Temp: 98.7 F (37.1 C) 98.5 F (36.9 C) 97.8 F (36.6 C) 98.3 F (36.8 C)  TempSrc: Oral  Oral Oral  SpO2: 95% 100% 100% 100%  Weight:       Weight change:   Physical Exam General: obese , female, alert and in NAD Heart: RRR, no murmurs, rubs or gallops Lungs: CTA b/l without wheezing, rhonchi or rales Abdomen: Soft,obese, NT, ND   +BS Extremities: No peripheral edema Dialysis Access:  LU AVF pos bruit   Dialysis Orders: TTS [facility]  4h 84kg 2/2 L AVF Heparin none L AVF - mircera 75 not started yet - calc 0.25 tiw  CXR - bilat infiltrates c/w edema BP 228/122 K+ 6.2 BUN 87 Cr 12.9  Problem/Plan: 1. Seizure- x1 in ED, received IM Ativan. Neurology suspected seizure was due to uremia/ electrolyte abnormalities, no new meds for now per primary. 2. ESRD - TTS HD. Missed Saturday.BUN improved to 37. Continue TTS schedule.  3. HTN/Volume -pulmonary edema on CXR initially, asymptomatic. Weight 84.6kg after HD on 11/10 4liter uf, yesterday  uf 2.612  Wt to 81.5   Lwer edw at dc  4. Severe depression - Followed by psychiatry outpatient. On multiple medications. 5. DM2 - per primary 6. Anemia ckd - Hb 9.5-  Has not started mircera yet, appears ESA was to be started on 11/7 but did not go to outpatient HD. Will order aranesp. 7. Metabolic bone disease: Corrected calcium 9.4. Phos 7.8- continue calcitriol and renvela, follow labs.  Ernest Haber, PA-C [facility] Beeper [phone] 04/15/2019,12:15 PM  LOS: 3 days   Labs: Basic Metabolic Panel: Recent Labs  Lab 04/12/19 1148 04/13/19 0216 04/14/19 0226  NA 136 137 137  K 6.2* 3.6 4.6  CL 96* 96* 95*  CO2 20* 26 24  GLUCOSE 300*  165* 96  BUN 87* 26* 37*  CREATININE 12.92* 6.42* 8.45*  CALCIUM 8.7* 8.5* 8.7*  PHOS  --   --  7.8*   Liver Function Tests: Recent Labs  Lab 04/12/19 1148 04/14/19 0226  AST 13*  --   ALT 17  --   ALKPHOS 90  --   BILITOT 1.3*  --   PROT 7.6  --   ALBUMIN 3.6 3.1*   Recent Labs  Lab 04/12/19 1148  LIPASE 90*   No results for input(s): AMMONIA in the last 168 hours. CBC: Recent Labs  Lab 04/12/19 1148 04/14/19 0226 04/14/19 0853  WBC 8.5  --  4.7  NEUTROABS 7.6  --   --   HGB 10.3* 9.3* 9.5*  HCT 33.3* 30.5* 31.4*  MCV 76.0*  --  78.1*  PLT 269  --  283   Cardiac Enzymes: No results for input(s): CKTOTAL, CKMB, CKMBINDEX, TROPONINI in the last 168 hours. CBG: Recent Labs  Lab 04/14/19 0747 04/14/19 1737 04/14/19 2116 04/15/19 0759 04/15/19 1151  GLUCAP 97 144* 223* 144* 188*    Studies/Results: No results found. Medications: . sodium chloride     . aspirin EC  81 mg Oral Daily  . atomoxetine  40 mg Oral Daily  . calcitRIOL  0.25 mcg Oral Q T,Th,Sa-HD  . calcium acetate  667 mg Oral TID WC  . Chlorhexidine Gluconate Cloth  6 each Topical Q0600  . clopidogrel  75 mg Oral Daily  . [START ON 04/16/2019] darbepoetin (ARANESP) injection - DIALYSIS  40 mcg Intravenous Q Sat-HD  . enoxaparin (LOVENOX) injection  30 mg Subcutaneous Q24H  . insulin aspart  0-5 Units Subcutaneous QHS  . insulin aspart  0-9 Units Subcutaneous TID WC  . insulin aspart  3 Units Subcutaneous TID WC  . latanoprost  1 drop Both Eyes QHS  . lidocaine  2 patch Transdermal Q24H  . multivitamin  1 tablet Oral QHS  . pantoprazole  40 mg Oral Q0600  . QUEtiapine  100 mg Oral QHS  . sevelamer carbonate  2,400 mg Oral TID WC  . sodium chloride flush  3 mL Intravenous Q12H

## 2019-04-20 ENCOUNTER — Inpatient Hospital Stay: Payer: Medicare Other | Admitting: Family Medicine

## 2019-04-24 ENCOUNTER — Telehealth: Payer: Self-pay

## 2019-04-24 NOTE — Telephone Encounter (Signed)
Scheduled time to visit pt for HOPES at the [facility] 6 tomorrow at 5:30pm.

## 2019-04-24 NOTE — Telephone Encounter (Signed)
Called pt to check on progress for PCP and housing. No answer. [name] MSN, RN

## 2019-04-24 NOTE — Congregational Nurse Program (Unsigned)
Pt very alert and oriented X 3, Physical therapist completing session no complaint of pain as I began interview. Vital signs taken  WNL.  Pt states she has not made contact with her PCP yet and was not able to state clearly housing arrangement.  Pt stated she had met with social worker and had plans to meet again. Made plans to speak later next week.

## 2019-04-24 NOTE — Telephone Encounter (Signed)
Pt ID using 2 identifiers. Pt alert and oriented X 3. NO signs of distress noted in conversation. Pt stated she was doing well and  had on and off pain at 4 of 10. Pt stated she had visited with social worker and was still working on housing. Stated she stated she had her medication and her home care services where set. She was unsure of name of company. Pt stated she had an appointment with PCP.  [name] MSN, RN

## 2019-04-24 NOTE — Congregational Nurse Program (Unsigned)
Pt is a black female 58 years, history of advance renal disease, above the knee left amputee, diabetes, and retinopathy. Pt alert and oriented to place, time, and  location. Pt interviewed for the HOPES program. Vital signs WNL with exception of Temp at 99.1, will retake 03/27/19. Lung sounds slightly congested RU lobe,cleared with cough.  Pt states she has plans to talk with Primary care provider to help setup home care visits. Pt received one [employer] gift card value 50.00. Pt made aware of rules and limitation of HOPES program and voiced comprehension and compliance. Pt was not clear on housing and stated she would be speaking with social worker again Tuesday.

## 2019-05-21 ENCOUNTER — Emergency Department (HOSPITAL_COMMUNITY): Payer: Medicare Other

## 2019-05-21 ENCOUNTER — Encounter (HOSPITAL_COMMUNITY): Payer: Self-pay | Admitting: Emergency Medicine

## 2019-05-21 ENCOUNTER — Other Ambulatory Visit: Payer: Self-pay

## 2019-05-21 ENCOUNTER — Inpatient Hospital Stay (HOSPITAL_COMMUNITY)
Admission: EM | Admit: 2019-05-21 | Discharge: 2019-06-03 | DRG: 207 | Disposition: E | Payer: Medicare Other | Attending: Internal Medicine | Admitting: Internal Medicine

## 2019-05-21 DIAGNOSIS — I160 Hypertensive urgency: Secondary | ICD-10-CM | POA: Diagnosis present

## 2019-05-21 DIAGNOSIS — Z66 Do not resuscitate: Secondary | ICD-10-CM | POA: Diagnosis not present

## 2019-05-21 DIAGNOSIS — Z7982 Long term (current) use of aspirin: Secondary | ICD-10-CM

## 2019-05-21 DIAGNOSIS — Z86711 Personal history of pulmonary embolism: Secondary | ICD-10-CM

## 2019-05-21 DIAGNOSIS — Z955 Presence of coronary angioplasty implant and graft: Secondary | ICD-10-CM

## 2019-05-21 DIAGNOSIS — I2510 Atherosclerotic heart disease of native coronary artery without angina pectoris: Secondary | ICD-10-CM | POA: Diagnosis present

## 2019-05-21 DIAGNOSIS — M898X9 Other specified disorders of bone, unspecified site: Secondary | ICD-10-CM | POA: Diagnosis present

## 2019-05-21 DIAGNOSIS — Z91048 Other nonmedicinal substance allergy status: Secondary | ICD-10-CM

## 2019-05-21 DIAGNOSIS — Z885 Allergy status to narcotic agent status: Secondary | ICD-10-CM

## 2019-05-21 DIAGNOSIS — J9611 Chronic respiratory failure with hypoxia: Secondary | ICD-10-CM

## 2019-05-21 DIAGNOSIS — I132 Hypertensive heart and chronic kidney disease with heart failure and with stage 5 chronic kidney disease, or end stage renal disease: Secondary | ICD-10-CM | POA: Diagnosis present

## 2019-05-21 DIAGNOSIS — Z978 Presence of other specified devices: Secondary | ICD-10-CM

## 2019-05-21 DIAGNOSIS — F329 Major depressive disorder, single episode, unspecified: Secondary | ICD-10-CM | POA: Diagnosis present

## 2019-05-21 DIAGNOSIS — E1022 Type 1 diabetes mellitus with diabetic chronic kidney disease: Secondary | ICD-10-CM | POA: Diagnosis present

## 2019-05-21 DIAGNOSIS — Z7902 Long term (current) use of antithrombotics/antiplatelets: Secondary | ICD-10-CM

## 2019-05-21 DIAGNOSIS — Y92009 Unspecified place in unspecified non-institutional (private) residence as the place of occurrence of the external cause: Secondary | ICD-10-CM

## 2019-05-21 DIAGNOSIS — E875 Hyperkalemia: Secondary | ICD-10-CM | POA: Diagnosis present

## 2019-05-21 DIAGNOSIS — L89312 Pressure ulcer of right buttock, stage 2: Secondary | ICD-10-CM | POA: Diagnosis not present

## 2019-05-21 DIAGNOSIS — Z452 Encounter for adjustment and management of vascular access device: Secondary | ICD-10-CM

## 2019-05-21 DIAGNOSIS — I6783 Posterior reversible encephalopathy syndrome: Secondary | ICD-10-CM | POA: Diagnosis present

## 2019-05-21 DIAGNOSIS — S0003XA Contusion of scalp, initial encounter: Secondary | ICD-10-CM | POA: Diagnosis present

## 2019-05-21 DIAGNOSIS — Z992 Dependence on renal dialysis: Secondary | ICD-10-CM

## 2019-05-21 DIAGNOSIS — R918 Other nonspecific abnormal finding of lung field: Secondary | ICD-10-CM | POA: Diagnosis present

## 2019-05-21 DIAGNOSIS — Z515 Encounter for palliative care: Secondary | ICD-10-CM | POA: Diagnosis not present

## 2019-05-21 DIAGNOSIS — E7800 Pure hypercholesterolemia, unspecified: Secondary | ICD-10-CM | POA: Diagnosis present

## 2019-05-21 DIAGNOSIS — E669 Obesity, unspecified: Secondary | ICD-10-CM | POA: Diagnosis present

## 2019-05-21 DIAGNOSIS — I959 Hypotension, unspecified: Secondary | ICD-10-CM | POA: Diagnosis not present

## 2019-05-21 DIAGNOSIS — J9690 Respiratory failure, unspecified, unspecified whether with hypoxia or hypercapnia: Secondary | ICD-10-CM

## 2019-05-21 DIAGNOSIS — L89322 Pressure ulcer of left buttock, stage 2: Secondary | ICD-10-CM | POA: Diagnosis not present

## 2019-05-21 DIAGNOSIS — Z79899 Other long term (current) drug therapy: Secondary | ICD-10-CM

## 2019-05-21 DIAGNOSIS — G92 Toxic encephalopathy: Secondary | ICD-10-CM

## 2019-05-21 DIAGNOSIS — J9600 Acute respiratory failure, unspecified whether with hypoxia or hypercapnia: Secondary | ICD-10-CM

## 2019-05-21 DIAGNOSIS — Z9115 Patient's noncompliance with renal dialysis: Secondary | ICD-10-CM

## 2019-05-21 DIAGNOSIS — N2581 Secondary hyperparathyroidism of renal origin: Secondary | ICD-10-CM | POA: Diagnosis present

## 2019-05-21 DIAGNOSIS — Z89612 Acquired absence of left leg above knee: Secondary | ICD-10-CM

## 2019-05-21 DIAGNOSIS — Z888 Allergy status to other drugs, medicaments and biological substances status: Secondary | ICD-10-CM

## 2019-05-21 DIAGNOSIS — N186 End stage renal disease: Secondary | ICD-10-CM | POA: Diagnosis present

## 2019-05-21 DIAGNOSIS — R29810 Facial weakness: Secondary | ICD-10-CM | POA: Diagnosis present

## 2019-05-21 DIAGNOSIS — W1830XA Fall on same level, unspecified, initial encounter: Secondary | ICD-10-CM | POA: Diagnosis not present

## 2019-05-21 DIAGNOSIS — E10649 Type 1 diabetes mellitus with hypoglycemia without coma: Secondary | ICD-10-CM | POA: Diagnosis not present

## 2019-05-21 DIAGNOSIS — G931 Anoxic brain damage, not elsewhere classified: Secondary | ICD-10-CM | POA: Diagnosis present

## 2019-05-21 DIAGNOSIS — Z9981 Dependence on supplemental oxygen: Secondary | ICD-10-CM

## 2019-05-21 DIAGNOSIS — Z8701 Personal history of pneumonia (recurrent): Secondary | ICD-10-CM

## 2019-05-21 DIAGNOSIS — G936 Cerebral edema: Secondary | ICD-10-CM | POA: Diagnosis present

## 2019-05-21 DIAGNOSIS — J45909 Unspecified asthma, uncomplicated: Secondary | ICD-10-CM | POA: Diagnosis present

## 2019-05-21 DIAGNOSIS — G40901 Epilepsy, unspecified, not intractable, with status epilepticus: Secondary | ICD-10-CM | POA: Diagnosis present

## 2019-05-21 DIAGNOSIS — J9601 Acute respiratory failure with hypoxia: Secondary | ICD-10-CM | POA: Diagnosis not present

## 2019-05-21 DIAGNOSIS — E1011 Type 1 diabetes mellitus with ketoacidosis with coma: Secondary | ICD-10-CM | POA: Diagnosis present

## 2019-05-21 DIAGNOSIS — D631 Anemia in chronic kidney disease: Secondary | ICD-10-CM | POA: Diagnosis present

## 2019-05-21 DIAGNOSIS — Z6828 Body mass index (BMI) 28.0-28.9, adult: Secondary | ICD-10-CM

## 2019-05-21 DIAGNOSIS — Z89512 Acquired absence of left leg below knee: Secondary | ICD-10-CM

## 2019-05-21 DIAGNOSIS — I5032 Chronic diastolic (congestive) heart failure: Secondary | ICD-10-CM | POA: Diagnosis present

## 2019-05-21 DIAGNOSIS — R569 Unspecified convulsions: Secondary | ICD-10-CM

## 2019-05-21 DIAGNOSIS — R680 Hypothermia, not associated with low environmental temperature: Secondary | ICD-10-CM | POA: Diagnosis present

## 2019-05-21 DIAGNOSIS — Z20828 Contact with and (suspected) exposure to other viral communicable diseases: Secondary | ICD-10-CM | POA: Diagnosis present

## 2019-05-21 DIAGNOSIS — Z794 Long term (current) use of insulin: Secondary | ICD-10-CM

## 2019-05-21 LAB — I-STAT BETA HCG BLOOD, ED (MC, WL, AP ONLY): I-stat hCG, quantitative: 12.1 m[IU]/mL — ABNORMAL HIGH (ref <5)

## 2019-05-21 LAB — CBC WITH DIFFERENTIAL/PLATELET
Abs Immature Granulocytes: 0.15 10*3/uL — ABNORMAL HIGH (ref 0.00–0.07)
Basophils Absolute: 0 10*3/uL (ref 0.0–0.1)
Basophils Relative: 0 %
Eosinophils Absolute: 0 10*3/uL (ref 0.0–0.5)
Eosinophils Relative: 0 %
HCT: 33.5 % — ABNORMAL LOW (ref 36.0–46.0)
Hemoglobin: 9.9 g/dL — ABNORMAL LOW (ref 12.0–15.0)
Immature Granulocytes: 1 %
Lymphocytes Relative: 7 %
Lymphs Abs: 1 10*3/uL (ref 0.7–4.0)
MCH: 23.7 pg — ABNORMAL LOW (ref 26.0–34.0)
MCHC: 29.6 g/dL — ABNORMAL LOW (ref 30.0–36.0)
MCV: 80.1 fL (ref 80.0–100.0)
Monocytes Absolute: 0.4 10*3/uL (ref 0.1–1.0)
Monocytes Relative: 3 %
Neutro Abs: 13.1 10*3/uL — ABNORMAL HIGH (ref 1.7–7.7)
Neutrophils Relative %: 89 %
Platelets: 234 10*3/uL (ref 150–400)
RBC: 4.18 MIL/uL (ref 3.87–5.11)
RDW: 21 % — ABNORMAL HIGH (ref 11.5–15.5)
WBC: 14.7 10*3/uL — ABNORMAL HIGH (ref 4.0–10.5)
nRBC: 0.2 % (ref 0.0–0.2)

## 2019-05-21 LAB — POCT I-STAT 7, (LYTES, BLD GAS, ICA,H+H)
Acid-base deficit: 13 mmol/L — ABNORMAL HIGH (ref 0.0–2.0)
Bicarbonate: 13.8 mmol/L — ABNORMAL LOW (ref 20.0–28.0)
Calcium, Ion: 1.15 mmol/L (ref 1.15–1.40)
HCT: 31 % — ABNORMAL LOW (ref 36.0–46.0)
Hemoglobin: 10.5 g/dL — ABNORMAL LOW (ref 12.0–15.0)
O2 Saturation: 100 %
Patient temperature: 91.9
Potassium: 6.4 mmol/L (ref 3.5–5.1)
Sodium: 138 mmol/L (ref 135–145)
TCO2: 15 mmol/L — ABNORMAL LOW (ref 22–32)
pCO2 arterial: 29.1 mmHg — ABNORMAL LOW (ref 32.0–48.0)
pH, Arterial: 7.264 — ABNORMAL LOW (ref 7.350–7.450)
pO2, Arterial: 212 mmHg — ABNORMAL HIGH (ref 83.0–108.0)

## 2019-05-21 LAB — URINALYSIS, COMPLETE (UACMP) WITH MICROSCOPIC
Bilirubin Urine: NEGATIVE
Glucose, UA: >=500 mg/dL — AB
Hgb urine dipstick: NEGATIVE
Ketones, ur: 20 mg/dL — AB
Leukocytes,Ua: NEGATIVE
Nitrite: NEGATIVE
Protein, ur: >=300 mg/dL — AB
Specific Gravity, Urine: 1.013 (ref 1.005–1.030)
pH: 6 (ref 5.0–8.0)

## 2019-05-21 LAB — PROTIME-INR
INR: 1.2 (ref 0.8–1.2)
Prothrombin Time: 15.4 seconds — ABNORMAL HIGH (ref 11.4–15.2)

## 2019-05-21 LAB — POCT I-STAT EG7
Acid-base deficit: 14 mmol/L — ABNORMAL HIGH (ref 0.0–2.0)
Bicarbonate: 12.8 mmol/L — ABNORMAL LOW (ref 20.0–28.0)
Calcium, Ion: 1.03 mmol/L — ABNORMAL LOW (ref 1.15–1.40)
HCT: 34 % — ABNORMAL LOW (ref 36.0–46.0)
Hemoglobin: 11.6 g/dL — ABNORMAL LOW (ref 12.0–15.0)
O2 Saturation: 99 %
Potassium: 7.7 mmol/L (ref 3.5–5.1)
Sodium: 135 mmol/L (ref 135–145)
TCO2: 14 mmol/L — ABNORMAL LOW (ref 22–32)
pCO2, Ven: 32.1 mmHg — ABNORMAL LOW (ref 44.0–60.0)
pH, Ven: 7.209 — ABNORMAL LOW (ref 7.250–7.430)
pO2, Ven: 149 mmHg — ABNORMAL HIGH (ref 32.0–45.0)

## 2019-05-21 LAB — COMPREHENSIVE METABOLIC PANEL
ALT: 11 U/L (ref 0–44)
AST: 26 U/L (ref 15–41)
Albumin: 3.3 g/dL — ABNORMAL LOW (ref 3.5–5.0)
Alkaline Phosphatase: 51 U/L (ref 38–126)
Anion gap: 31 — ABNORMAL HIGH (ref 5–15)
BUN: 174 mg/dL — ABNORMAL HIGH (ref 6–20)
CO2: 10 mmol/L — ABNORMAL LOW (ref 22–32)
Calcium: 8.7 mg/dL — ABNORMAL LOW (ref 8.9–10.3)
Chloride: 99 mmol/L (ref 98–111)
Creatinine, Ser: 15.95 mg/dL — ABNORMAL HIGH (ref 0.44–1.00)
GFR calc Af Amer: 3 mL/min — ABNORMAL LOW (ref >60)
GFR calc non Af Amer: 2 mL/min — ABNORMAL LOW (ref >60)
Glucose, Bld: 446 mg/dL — ABNORMAL HIGH (ref 70–99)
Potassium: >7.5 mmol/L (ref 3.5–5.1)
Sodium: 140 mmol/L (ref 135–145)
Total Bilirubin: 1.1 mg/dL (ref 0.3–1.2)
Total Protein: 6.3 g/dL — ABNORMAL LOW (ref 6.5–8.1)

## 2019-05-21 LAB — CK: Total CK: 451 U/L — ABNORMAL HIGH (ref 38–234)

## 2019-05-21 LAB — I-STAT CHEM 8, ED
BUN: >140 mg/dL — ABNORMAL HIGH (ref 6–20)
Calcium, Ion: 1.04 mmol/L — ABNORMAL LOW (ref 1.15–1.40)
Chloride: 106 mmol/L (ref 98–111)
Creatinine, Ser: 15.8 mg/dL — ABNORMAL HIGH (ref 0.44–1.00)
Glucose, Bld: 418 mg/dL — ABNORMAL HIGH (ref 70–99)
HCT: 33 % — ABNORMAL LOW (ref 36.0–46.0)
Hemoglobin: 11.2 g/dL — ABNORMAL LOW (ref 12.0–15.0)
Potassium: 7.7 mmol/L (ref 3.5–5.1)
Sodium: 136 mmol/L (ref 135–145)
TCO2: 15 mmol/L — ABNORMAL LOW (ref 22–32)

## 2019-05-21 LAB — RAPID URINE DRUG SCREEN, HOSP PERFORMED
Amphetamines: NOT DETECTED
Barbiturates: NOT DETECTED
Benzodiazepines: NOT DETECTED
Cocaine: NOT DETECTED
Opiates: NOT DETECTED
Tetrahydrocannabinol: NOT DETECTED

## 2019-05-21 LAB — TSH: TSH: 0.943 u[IU]/mL (ref 0.350–4.500)

## 2019-05-21 LAB — LACTIC ACID, PLASMA: Lactic Acid, Venous: 1 mmol/L (ref 0.5–1.9)

## 2019-05-21 LAB — RESPIRATORY PANEL BY RT PCR (FLU A&B, COVID)
Influenza A by PCR: NEGATIVE
Influenza B by PCR: NEGATIVE
SARS Coronavirus 2 by RT PCR: NEGATIVE

## 2019-05-21 LAB — APTT: aPTT: 31 seconds (ref 24–36)

## 2019-05-21 LAB — TROPONIN I (HIGH SENSITIVITY)
Troponin I (High Sensitivity): 42 ng/L — ABNORMAL HIGH (ref <18)
Troponin I (High Sensitivity): 42 ng/L — ABNORMAL HIGH (ref <18)

## 2019-05-21 LAB — ETHANOL: Alcohol, Ethyl (B): <10 mg/dL (ref <10)

## 2019-05-21 LAB — AMMONIA: Ammonia: 35 umol/L (ref 9–35)

## 2019-05-21 MED ORDER — SODIUM BICARBONATE 8.4 % IV SOLN: 50.0000 meq | Freq: Once | INTRAVENOUS | Status: DC

## 2019-05-21 MED ORDER — LEVETIRACETAM IN NACL 1000 MG/100ML IV SOLN
1000.0000 mg | Freq: Once | INTRAVENOUS | Status: AC
  Administered 2019-05-21: 22:00:00 1000 mg via INTRAVENOUS
  Filled 2019-05-21: qty 100

## 2019-05-21 MED ORDER — CALCIUM GLUCONATE-NACL 1-0.675 GM/50ML-% IV SOLN
1.0000 g | Freq: Once | INTRAVENOUS | Status: AC
  Administered 2019-05-21: 1000 mg via INTRAVENOUS
  Filled 2019-05-21: qty 50

## 2019-05-21 MED ORDER — CHLORHEXIDINE GLUCONATE CLOTH 2 % EX PADS: 6.0000 | MEDICATED_PAD | Freq: Every day | CUTANEOUS | Status: DC

## 2019-05-21 MED ORDER — INSULIN ASPART 100 UNIT/ML ~~LOC~~ SOLN
10.0000 [IU] | Freq: Once | SUBCUTANEOUS | Status: AC
  Administered 2019-05-21: 21:00:00 10 [IU] via INTRAVENOUS

## 2019-05-21 MED ORDER — ROCURONIUM BROMIDE 50 MG/5ML IV SOLN
80.0000 mg | Freq: Once | INTRAVENOUS | Status: AC
  Administered 2019-05-21: 23:00:00 80 mg via INTRAVENOUS
  Filled 2019-05-21: qty 8

## 2019-05-21 MED ORDER — SODIUM BICARBONATE 8.4 % IV SOLN
50.0000 meq | Freq: Once | INTRAVENOUS | Status: AC
  Administered 2019-05-21: 50 meq via INTRAVENOUS
  Filled 2019-05-21: qty 50

## 2019-05-21 MED ORDER — SODIUM CHLORIDE 0.9 % IV SOLN
1.0000 g | INTRAVENOUS | Status: DC
  Filled 2019-05-21: qty 1

## 2019-05-21 MED ORDER — VANCOMYCIN VARIABLE DOSE PER UNSTABLE RENAL FUNCTION (PHARMACIST DOSING): Status: DC

## 2019-05-21 MED ORDER — SODIUM CHLORIDE 0.9 % IV SOLN: 1.0000 g | Freq: Once | INTRAVENOUS | Status: DC

## 2019-05-21 MED ORDER — ETOMIDATE 2 MG/ML IV SOLN
20.0000 mg | Freq: Once | INTRAVENOUS | Status: AC
  Administered 2019-05-21: 23:00:00 20 mg via INTRAVENOUS

## 2019-05-21 MED ORDER — CALCIUM GLUCONATE-NACL 1-0.675 GM/50ML-% IV SOLN: 1.0000 g | Freq: Once | INTRAVENOUS | Status: DC

## 2019-05-21 MED ORDER — VANCOMYCIN HCL 10 G IV SOLR
1750.0000 mg | Freq: Once | INTRAVENOUS | Status: AC
  Administered 2019-05-22: 1750 mg via INTRAVENOUS
  Filled 2019-05-21: qty 1750

## 2019-05-21 MED ORDER — PROPOFOL 1000 MG/100ML IV EMUL
5.0000 ug/kg/min | INTRAVENOUS | Status: DC
  Administered 2019-05-21: 23:00:00 10 ug/kg/min via INTRAVENOUS
  Filled 2019-05-21: qty 100

## 2019-05-21 MED ORDER — SODIUM CHLORIDE 0.9 % IV SOLN
2.0000 g | Freq: Once | INTRAVENOUS | Status: AC
  Administered 2019-05-22: 2 g via INTRAVENOUS
  Filled 2019-05-21: qty 2

## 2019-05-21 MED ORDER — INSULIN ASPART 100 UNIT/ML IV SOLN: 5.0000 [IU] | Freq: Once | INTRAVENOUS | Status: DC

## 2019-05-21 MED ORDER — LORAZEPAM 2 MG/ML IJ SOLN
2.0000 mg | Freq: Once | INTRAMUSCULAR | Status: AC
  Administered 2019-05-21: 2 mg via INTRAVENOUS
  Filled 2019-05-21: qty 1

## 2019-05-21 NOTE — ED Notes (Signed)
I Stat Chem 8 result of , K 7.7, and VBG K of 7.7 reported to Dr. Wilson Singer, and Dr. Elana Alm.

## 2019-05-21 NOTE — Consult Note (Signed)
[facility]: Reason for Consult: hyperkalemia and to manage dialysis and dialysis related needs Referring Physician: Dr Virgel Manifold  HPI:  Lauren Warner is an 53 y.o. female with hypertension, DM, anemia, seizure disorder, ESRD on HD TTS at [facility], chronic hyperkalemia, for seizure disorder, brought in by EMS after found down naked on the floor with seizure-like activity.  Patient was covered in feces.   In the ER patient is hypothermic, blood pressure elevated and on nonrebreather.  The lab result consistent with potassium 7.7, BUN more than 140.  We are consulted for urgent HD.  Patient received sodium bicarbonate, insulin, calcium gluconate.  Started on vancomycin and cefepime as empiric antibiotics. During my examination, patient was constantly seizing, not responding. Patient is about to get intubated by EDP.  Discussed with the ER team.  She will be admitted at ICU.  Dialysis Orders: [facility]  4h 84kg 2/2 L AVF Heparin none L AVF - calc 0.25 tiw   Past Medical History:  Diagnosis Date  . Anemia   . Anxiety   . Asthma   . CHF (congestive heart failure) ([place])   . Coronary artery disease   . Daily headache   . Depression   . ESRD (end stage renal disease) on dialysis ([place])    "[facility]; TTS" (10/19/2017)  . High cholesterol   . History of blood transfusion 10/19/2017   "anemia"  . Hyperkalemia 10/2017  . Hypertension   . On home oxygen therapy    "2L; daily" (10/19/2017)  . Pneumonia    "several times" (10/19/2017)  . Pulmonary embolism ([place]) 2012  . Type 1 diabetes mellitus ([place])     Past Surgical History:  Procedure Laterality Date  . AV FISTULA PLACEMENT Left 2018  . CESAREAN SECTION  1989  . CORONARY ANGIOPLASTY WITH STENT PLACEMENT  ~ 08/2017   "3 stents" (10/19/2017)  . ENDOMETRIAL ABLATION  ~ 2011    Family History  Problem Relation Age of Onset  . Stroke Sister     Social History:  reports that she has never  smoked. She has never used smokeless tobacco. She reports that she does not drink alcohol or use drugs.  Allergies:  Allergies  Allergen Reactions  . Adhesive [Tape] Other (See Comments)    "RIPS MY SKIN," SO PLEASE USE COBAN WRAP!!  . Other Rash    "RIPS MY SKIN," SO PLEASE USE COBAN WRAP!! "RIPS MY SKIN," SO PLEASE USE COBAN WRAP!!  . Hydrocodone-Acetaminophen Other (See Comments)    "Makes me feel crazy"  . Metformin And Related Other (See Comments)    "Makes me feel crazy"    Medications: I have reviewed the patient's current medications.   Results for orders placed or performed during the hospital encounter of 05/25/2019 (from the past 48 hour(s))  Ethanol     Status: None   Collection Time: 05/03/2019  8:01 PM  Result Value Ref Range   Alcohol, Ethyl (B) <10 <10 mg/dL    Comment: (NOTE) Lowest detectable limit for serum alcohol is 10 mg/dL. For medical purposes only. Performed at [facility], [place] [address] [place], [place] [address]   CK     Status: Abnormal   Collection Time: 05/20/2019  8:04 PM  Result Value Ref Range   Total CK 451 (H) 38 - 234 U/L    Comment: Performed at [facility], [place] [address], [place], [place] [address]  CBC WITH DIFFERENTIAL     Status:  Abnormal   Collection Time: 05/14/2019  8:04 PM  Result Value Ref Range   WBC 14.7 (H) 4.0 - 10.5 K/uL   RBC 4.18 3.87 - 5.11 MIL/uL   Hemoglobin 9.9 (L) 12.0 - 15.0 g/dL   HCT 33.5 (L) 36.0 - 46.0 %   MCV 80.1 80.0 - 100.0 fL   MCH 23.7 (L) 26.0 - 34.0 pg   MCHC 29.6 (L) 30.0 - 36.0 g/dL   RDW 21.0 (H) 11.5 - 15.5 %   Platelets 234 150 - 400 K/uL   nRBC 0.2 0.0 - 0.2 %   Neutrophils Relative % 89 %   Neutro Abs 13.1 (H) 1.7 - 7.7 K/uL   Lymphocytes Relative 7 %   Lymphs Abs 1.0 0.7 - 4.0 K/uL   Monocytes Relative 3 %   Monocytes Absolute 0.4 0.1 - 1.0 K/uL   Eosinophils Relative 0 %   Eosinophils Absolute 0.0 0.0 - 0.5 K/uL   Basophils Relative 0 %   Basophils Absolute 0.0 0.0 - 0.1  K/uL   Immature Granulocytes 1 %   Abs Immature Granulocytes 0.15 (H) 0.00 - 0.07 K/uL    Comment: Performed at [facility], [address], [place], [place] [address]  Protime-INR     Status: Abnormal   Collection Time: 06/01/2019  8:04 PM  Result Value Ref Range   Prothrombin Time 15.4 (H) 11.4 - 15.2 seconds   INR 1.2 0.8 - 1.2    Comment: (NOTE) INR goal varies based on device and disease states. Performed at [facility], [place] [address], [place], [place] [address]   APTT     Status: None   Collection Time: 05/29/2019  8:04 PM  Result Value Ref Range   aPTT 31 24 - 36 seconds    Comment: Performed at [facility] [address], [place], [place] [address]  Comprehensive metabolic panel     Status: Abnormal   Collection Time: 06/01/2019  8:04 PM  Result Value Ref Range   Sodium 140 135 - 145 mmol/L   Potassium >7.5 (HH) 3.5 - 5.1 mmol/L    Comment: SLIGHT HEMOLYSIS CRITICAL RESULT CALLED TO, READ BACK BY AND VERIFIED WITH: STRANGERS [facility] 05/03/2019 2119 WAYK    Chloride 99 98 - 111 mmol/L   CO2 10 (L) 22 - 32 mmol/L   Glucose, Bld 446 (H) 70 - 99 mg/dL   BUN 174 (H) 6 - 20 mg/dL   Creatinine, Ser 15.95 (H) 0.44 - 1.00 mg/dL   Calcium 8.7 (L) 8.9 - 10.3 mg/dL   Total Protein 6.3 (L) 6.5 - 8.1 g/dL   Albumin 3.3 (L) 3.5 - 5.0 g/dL   AST 26 15 - 41 U/L   ALT 11 0 - 44 U/L   Alkaline Phosphatase 51 38 - 126 U/L   Total Bilirubin 1.1 0.3 - 1.2 mg/dL   GFR calc non Af Amer 2 (L) >60 mL/min   GFR calc Af Amer 3 (L) >60 mL/min   Anion gap 31 (H) 5 - 15    Comment: Performed at [facility], [place] [address] [place], [place] [address]  Ammonia     Status: None   Collection Time: 05/11/2019  8:04 PM  Result Value Ref Range   Ammonia 35 9 - 35 umol/L    Comment: Performed at [facility], [place] [address], [place], [place] [address]  Troponin I (High Sensitivity)     Status: Abnormal  Collection Time: 05/20/2019  8:04 PM  Result Value Ref Range   Troponin  I (High Sensitivity) 42 (H) <18 ng/L    Comment: (NOTE) Elevated high sensitivity troponin I (hsTnI) values and significant  changes across serial measurements may suggest ACS but many other  chronic and acute conditions are known to elevate hsTnI results.  Refer to the Links section for chest pain algorithms and additional  guidance. Performed at [facility], [place] [address] [place], [place] [address]   Lactic acid, plasma     Status: None   Collection Time: 05/05/2019  8:05 PM  Result Value Ref Range   Lactic Acid, Venous 1.0 0.5 - 1.9 mmol/L    Comment: Performed at [facility] [address] [place], [place] [address]  Respiratory Panel by RT PCR (Flu A&B, Covid) - Nasopharyngeal Swab     Status: None   Collection Time: 05/26/2019  8:11 PM   Specimen: Nasopharyngeal Swab  Result Value Ref Range   SARS Coronavirus 2 by RT PCR NEGATIVE NEGATIVE    Comment: (NOTE) SARS-CoV-2 target nucleic acids are NOT DETECTED. The SARS-CoV-2 RNA is generally detectable in upper respiratoy specimens during the acute phase of infection. The lowest concentration of SARS-CoV-2 viral copies this assay can detect is 131 copies/mL. A negative result does not preclude SARS-Cov-2 infection and should not be used as the sole basis for treatment or other patient management decisions. A negative result may occur with  improper specimen collection/handling, submission of specimen other than nasopharyngeal swab, presence of viral mutation(s) within the areas targeted by this assay, and inadequate number of viral copies (<131 copies/mL). A negative result must be combined with clinical observations, patient history, and epidemiological information. The expected result is Negative. Fact Sheet for Patients:  PinkCheek.be Fact Sheet for Healthcare Providers:  GravelBags.it This test is not yet ap proved or cleared by the [place] FDA  and  has been authorized for detection and/or diagnosis of SARS-CoV-2 by FDA under an Emergency Use Authorization (EUA). This EUA will remain  in effect (meaning this test can be used) for the duration of the COVID-19 declaration under Section 564(b)(1) of the Act, 21 U.S.C. section 360bbb-3(b)(1), unless the authorization is terminated or revoked sooner.    Influenza A by PCR NEGATIVE NEGATIVE   Influenza B by PCR NEGATIVE NEGATIVE    Comment: (NOTE) The Xpert Xpress SARS-CoV-2/FLU/RSV assay is intended as an aid in  the diagnosis of influenza from Nasopharyngeal swab specimens and  should not be used as a sole basis for treatment. Nasal washings and  aspirates are unacceptable for Xpert Xpress SARS-CoV-2/FLU/RSV  testing. Fact Sheet for Patients: PinkCheek.be Fact Sheet for Healthcare Providers: GravelBags.it This test is not yet approved or cleared by the [place] FDA and  has been authorized for detection and/or diagnosis of SARS-CoV-2 by  FDA under an Emergency Use Authorization (EUA). This EUA will remain  in effect (meaning this test can be used) for the duration of the  Covid-19 declaration under Section 564(b)(1) of the Act, 21  U.S.C. section 360bbb-3(b)(1), unless the authorization is  terminated or revoked. Performed at [facility], [place] [address], [place], [place] [address]   I-Stat beta hCG blood, ED     Status: Abnormal   Collection Time: 05/09/2019  8:13 PM  Result Value Ref Range   I-stat hCG, quantitative 12.1 (H) <5 mIU/mL   Comment 3  Comment:   GEST. AGE      CONC.  (mIU/mL)   <=1 WEEK        5 - 50     2 WEEKS       50 - 500     3 WEEKS       100 - 10,000     4 WEEKS     1,000 - 30,000        FEMALE AND NON-PREGNANT FEMALE:     LESS THAN 5 mIU/mL   I-stat chem 8, ED     Status: Abnormal   Collection Time: 05/19/2019  8:15 PM  Result Value Ref Range   Sodium 136 135 - 145  mmol/L   Potassium 7.7 (HH) 3.5 - 5.1 mmol/L   Chloride 106 98 - 111 mmol/L   BUN >140 (H) 6 - 20 mg/dL   Creatinine, Ser 15.80 (H) 0.44 - 1.00 mg/dL   Glucose, Bld 418 (H) 70 - 99 mg/dL   Calcium, Ion 1.04 (L) 1.15 - 1.40 mmol/L   TCO2 15 (L) 22 - 32 mmol/L   Hemoglobin 11.2 (L) 12.0 - 15.0 g/dL   HCT 33.0 (L) 36.0 - 46.0 %   Comment NOTIFIED PHYSICIAN   POCT I-Stat EG7     Status: Abnormal   Collection Time: 05/22/2019  8:15 PM  Result Value Ref Range   pH, Ven 7.209 (L) 7.250 - 7.430   pCO2, Ven 32.1 (L) 44.0 - 60.0 mmHg   pO2, Ven 149.0 (H) 32.0 - 45.0 mmHg   Bicarbonate 12.8 (L) 20.0 - 28.0 mmol/L   TCO2 14 (L) 22 - 32 mmol/L   O2 Saturation 99.0 %   Acid-base deficit 14.0 (H) 0.0 - 2.0 mmol/L   Sodium 135 135 - 145 mmol/L   Potassium 7.7 (HH) 3.5 - 5.1 mmol/L   Calcium, Ion 1.03 (L) 1.15 - 1.40 mmol/L   HCT 34.0 (L) 36.0 - 46.0 %   Hemoglobin 11.6 (L) 12.0 - 15.0 g/dL   Patient temperature HIDE    Sample type VENOUS    Comment NOTIFIED PHYSICIAN   TSH     Status: None   Collection Time: 05/06/2019  8:16 PM  Result Value Ref Range   TSH 0.943 0.350 - 4.500 uIU/mL    Comment: Performed by a 3rd Generation assay with a functional sensitivity of <=0.01 uIU/mL. Performed at [facility], [place] [address] [place], [place] [address]   Urinalysis, Complete w Microscopic     Status: Abnormal   Collection Time: 05/12/2019  9:25 PM  Result Value Ref Range   Color, Urine YELLOW YELLOW   APPearance HAZY (A) CLEAR   Specific Gravity, Urine 1.013 1.005 - 1.030   pH 6.0 5.0 - 8.0   Glucose, UA >=500 (A) NEGATIVE mg/dL   Hgb urine dipstick NEGATIVE NEGATIVE   Bilirubin Urine NEGATIVE NEGATIVE   Ketones, ur 20 (A) NEGATIVE mg/dL   Protein, ur >=300 (A) NEGATIVE mg/dL   Nitrite NEGATIVE NEGATIVE   Leukocytes,Ua NEGATIVE NEGATIVE   RBC / HPF 0-5 0 - 5 RBC/hpf   WBC, UA 0-5 0 - 5 WBC/hpf   Bacteria, UA RARE (A) NONE SEEN   Squamous Epithelial / LPF 0-5 0 - 5    Comment:  Performed at [facility] [address] [place], [place] [address]  Urine rapid drug screen (hosp performed)     Status: None   Collection Time: 05/04/2019  9:25 PM  Result Value Ref  Range   Opiates NONE DETECTED NONE DETECTED   Cocaine NONE DETECTED NONE DETECTED   Benzodiazepines NONE DETECTED NONE DETECTED   Amphetamines NONE DETECTED NONE DETECTED   Tetrahydrocannabinol NONE DETECTED NONE DETECTED   Barbiturates NONE DETECTED NONE DETECTED    Comment: (NOTE) DRUG SCREEN FOR MEDICAL PURPOSES ONLY.  IF CONFIRMATION IS NEEDED FOR ANY PURPOSE, NOTIFY LAB WITHIN 5 DAYS. LOWEST DETECTABLE LIMITS FOR URINE DRUG SCREEN Drug Class                     Cutoff (ng/mL) Amphetamine and metabolites    1000 Barbiturate and metabolites    200 Benzodiazepine                 [id number] Tricyclics and metabolites     300 Opiates and metabolites        300 Cocaine and metabolites        300 THC                            50 Performed at [facility], [place] [address] [place], [place] [address]     DG Chest Portable 1 View  Result Date: 05/19/2019 CLINICAL DATA:  Altered mental status EXAM: PORTABLE CHEST 1 VIEW COMPARISON:  April 12, 2019 FINDINGS: The heart size is significantly enlarged. Again noted are prominent interstitial lung markings. There is no pneumothorax. May be small bilateral pleural effusions. There is a new focal density measuring approximately 9 mm in the right upper lung zone. There is elevation of the right hemidiaphragm. IMPRESSION: 1. Cardiomegaly with findings concerning for pulmonary edema. 2. New 9 mm density projecting over the right upper lung zone. This could represent a pulmonary nodule or focal infiltrate. A 4-6 week follow-up two-view chest x-ray is recommended to confirm resolution of this finding. Electronically Signed   By: Constance Holster M.D.   On: 05/18/2019 21:34    ROS: Unable to obtain as patient is not responding. Blood pressure (!) 155/137,  pulse (!) 104, temperature (!) 89.2 F (31.8 C), resp. rate (!) 21, weight 81.2 kg, SpO2 (!) 78 %.  General: Critically ill looking female, having myoclonic seizure, not responding Neck: No mass or JVD Respiratory: Coarse breath sound bilateral, increased work of breathing Cardiovascular: Tachycardic, S1-S2 GI: Abdomen soft, not distended Extremities: No edema.  However has fecal contamination and dry blood in upper extremities Neurology: Not responding and having seizure-like activity Vascular access: Left upper extremity AV fistula has good thrill and bruit.  Assessment/Plan: #AMS/seizure: Received Keppra in ER and about to receive Ativan.  Obtaining stat CT head and probably neurology consult.  #Hyperkalemia: Probably due to nonadherence with HD treatment. eCUBE is currently down therefore unable to review the OP HD treatment.  Received medical treatment in ER.  Plan for urgent HD with 1K bath.  #Acute respiratory failure with hypoxia: About two be intubated in ER.  Going to ICU.  On empiric antibiotics  # ESRD: TTS at [facility].  AVF for the access.  HD tonight as discussed above.  #Hypertension: UF during HD.  Resume home medication.  # Anemia of ESRD: Hemoglobin at goal.  No need for ESA.  # Metabolic Bone Disease: Check phosphorus level.  Critically ill patient.  Being admitted to ICU.  Discussed with ER team   Brayam Boeke Tanna Furry 05/08/2019, 10:23 PM

## 2019-05-21 NOTE — ED Notes (Signed)
Pt needing antibiotics but only has one line, which is running Propofol. IV team consult placed due to pt being difficult stick

## 2019-05-21 NOTE — ED Notes (Signed)
IV team at bedside 

## 2019-05-21 NOTE — ED Provider Notes (Signed)
I saw and evaluated the patient, reviewed the resident's note and I agree with the findings and plan.  53 year old female with altered mental status.  Daughter called police to do a welfare check.  They saw her through the window on the floor and appearred to be seizing.  On EMS arrival she still appeared to be seizing.  She was covered in feces.  At times she appeared to have decorticate posturing.  Glucose was in 300s.  Of note, EMS stated that this apparently was her residence although the house was completely empty and and it did not feel like it was heated.  Seizure-like activity abated with 5 mg of Versed IM.  On arrival she had snoring respirations.  No obvious external signs of trauma. She was localizing pain with her right upper extremity.  No discernible movements of right lower extremity or left upper extremity.  Amputation of the left lower extremity.  Pupils 63mm and symmetric. Sluggish. Hypothermic. Dialysis fistula LUE with thrill.   IV established. Warm. Place temp foley. EKG. Labs. CT head. Load Keppra.   Hyperkalemic. Ca/Bicarb/insulin. BUN >140. Emergent nephrolo Evaluated by nephrology. HD tonight.   EKG:  Rhythm: normal sinus Rate: 75 PR: 177 ms QRS: 110 ms QTc: 518 ST segments: NS ST changes  Angiocath insertion Performed by: Virgel Manifold  Consent: Verbal consent obtained. Risks and benefits: risks, benefits and alternatives were discussed Time out: Immediately prior to procedure a "time out" was called to verify the correct patient, procedure, equipment, support staff and site/side marked as required.  Preparation: Patient was prepped and draped in the usual sterile fashion.  Vein Location: R EJ  Gauge: 20  Normal blood return and flush without difficulty Patient tolerance: Patient tolerated the procedure well with no immediate complications.  CRITICAL CARE Performed by: Virgel Manifold Total critical care time: 80 minutes Critical care time was exclusive of  separately billable procedures and treating other patients. Critical care was necessary to treat or prevent imminent or life-threatening deterioration. Critical care was time spent personally by me on the following activities: development of treatment plan with patient and/or surrogate as well as nursing, discussions with consultants, evaluation of patient's response to treatment, examination of patient, obtaining history from patient or surrogate, ordering and performing treatments and interventions, ordering and review of laboratory studies, ordering and review of radiographic studies, pulse oximetry and re-evaluation of patient's condition.  Lauren Warner was evaluated in Emergency Department on 05/26/2019 for the symptoms described in the history of present illness. She was evaluated in the context of the global COVID-19 pandemic, which necessitated consideration that the patient might be at risk for infection with the SARS-CoV-2 virus that causes COVID-19. Institutional protocols and algorithms that pertain to the evaluation of patients at risk for COVID-19 are in a state of rapid change based on information released by regulatory bodies including the CDC and federal and state organizations. These policies and algorithms were followed during the patient's care in the ED.    Virgel Manifold, MD 05/16/2019 2325

## 2019-05-21 NOTE — Consult Note (Signed)
Neurology Consultation  Reason for Consult: Seizure, altered mental status, found down Referring Physician: Dr. Kohut  CC: Seizure, altered mental status, found down  History is obtained from: Chart review  HPI: Lauren Warner is a 53 y.o. female past history of CHF, ESRD Tuesday Thursday Saturday, hypertension, hyperkalemia, on 2 L of oxygen a day, history of pulmonary embolism and diabetes brought into the hospital after being found down at home after police were asked to do a welfare check on her as family had not heard from her for an unknown duration of time.  Apparently she was found seizing by EMS once police force entry into the house.  The house was empty.  She had some medications bottles in her phone next to her.  The empty house was also not heated.  EMS observed seizure-like activity that abated following 5 mg of Versed. She continued to be altered with snoring respirations on arrival.  No signs of trauma but her skin had crusted residual vomitus all over her.  Unclear as to how long head she had been down. Initially localizing with the right and not moving the left for the ER providers. Vitals revealed temperature of 89.6 Fahrenheit, blood pressure in the 1 4160s on arrival with a MAP of about 110 to 115/min.  Heart rate 76. Received 2 mg of Ativan in the ER and continued to have some right-sided shaking and unresponsiveness.  Neurological consultation obtained. Laboratory testing in the ER revealed multiple derangements including VBG that showed a pH of 7.2, PCO2 of 32, PO2 149. Chemistries showed BUN of greater than 140, creatinine 15.80, blood glucose 446, albumin 3.3, calcium 8.7.  Anion gap 31.  CK 451.  High-sensitivity troponin 42.  Hematology showed a white count of 14.7, hemoglobin 9.9, platelet count of 234,000.  Chart review-patient had an episode of generalized tonic-clonic seizure in November 2020 when she was admitted for toxic metabolic encephalopathy due to her deranged  renal function.  Phone consultation with neurology was obtained at the time and no antiepileptic was recommended since seizure was in the setting of toxic metabolic derangements.  Recommendations were made to correct  metabolic derangements.  No record of any other seizure activity after that.  LKW: Unclear tpa given?: no, unknown last known well Premorbid modified Rankin scale (mRS): *3  ROS:  Unable to obtain due to altered mental status.   Past Medical History:  Diagnosis Date  . Anemia   . Anxiety   . Asthma   . CHF (congestive heart failure) (HCC)   . Coronary artery disease   . Daily headache   . Depression   . ESRD (end stage renal disease) on dialysis (HCC)    "[facility]; TTS" (10/19/2017)  . High cholesterol   . History of blood transfusion 10/19/2017   "anemia"  . Hyperkalemia 10/2017  . Hypertension   . On home oxygen therapy    "2L; daily" (10/19/2017)  . Pneumonia    "several times" (10/19/2017)  . Pulmonary embolism (HCC) 2012  . Type 1 diabetes mellitus (HCC)     Family History  Problem Relation Age of Onset  . Stroke Sister      Social History:   reports that she has never smoked. She has never used smokeless tobacco. She reports that she does not drink alcohol or use drugs.  Medications  Current Facility-Administered Medications:  .  [START ON 05/22/2019] ceFEPIme (MAXIPIME) 1 g in sodium chloride 0.9 % 100 mL IVPB, 1 g, Intravenous, Q24H, Baird,   Haley P, RPH .  ceFEPIme (MAXIPIME) 2 g in sodium chloride 0.9 % 100 mL IVPB, 2 g, Intravenous, Once, Romona Curls, [facility] .  [START ON 05/22/2019] Chlorhexidine Gluconate Cloth 2 % PADS 6 each, 6 each, Topical, Q0600, Rosita Fire, MD .  etomidate (AMIDATE) injection 20 mg, 20 mg, Intravenous, Once, Virgel Manifold, MD .  LORazepam (ATIVAN) injection 2 mg, 2 mg, Intravenous, Once, Virgel Manifold, MD .  propofol (DIPRIVAN) 1000 MG/100ML infusion, 5-80 mcg/kg/min, Intravenous, Continuous, Virgel Manifold,  MD .  rocuronium [facility]) injection 80 mg, 80 mg, Intravenous, Once, Virgel Manifold, MD .  vancomycin (VANCOCIN) 1,750 mg in sodium chloride 0.9 % 500 mL IVPB, 1,750 mg, Intravenous, Once, Romona Curls, [facility] .  [START ON 05/22/2019] vancomycin variable dose per unstable renal function (pharmacist dosing), , Does not apply, See admin instructions, Romona Curls, [facility]  Current Outpatient Medications:  .  albuterol (PROAIR HFA) 108 (90 Base) MCG/ACT inhaler, Inhale 2 puffs into the lungs every 6 (six) hours as needed for wheezing or shortness of breath., Disp: 1 Inhaler, Rfl: 1 .  aspirin EC 81 MG tablet, Take 1 tablet (81 mg total) by mouth daily. With food, Disp: 30 tablet, Rfl: 3 .  atomoxetine (STRATTERA) 40 MG capsule, Take 40 mg by mouth daily., Disp: , Rfl:  .  blood glucose meter kit and supplies KIT, Dispense based on patient and insurance preference. Use up to four times daily as directed. (FOR ICD-9 250.00, 250.01)., Disp: 1 each, Rfl: 0 .  Blood Pressure KIT, Check blood pressure once a day till evaluated by PCP, Disp: 1 kit, Rfl: 0 .  calcitRIOL (ROCALTROL) 0.25 MCG capsule, Take 1 capsule (0.25 mcg total) by mouth Every Tuesday,Thursday,and Saturday with dialysis., Disp: 30 capsule, Rfl: 0 .  calcium acetate (PHOSLO) 667 MG capsule, Take 1 capsule (667 mg total) by mouth 3 (three) times daily with meals., Disp: 90 capsule, Rfl: 0 .  clopidogrel (PLAVIX) 75 MG tablet, Take 1 tablet (75 mg total) by mouth daily., Disp: 30 tablet, Rfl: 0 .  escitalopram (LEXAPRO) 10 MG tablet, Take 1 tablet (10 mg total) by mouth daily., Disp: 30 tablet, Rfl: 0 .  gabapentin (NEURONTIN) 100 MG capsule, Take 1 capsule (100 mg total) by mouth 3 (three) times daily., Disp: 90 capsule, Rfl: 0 .  insulin aspart (NOVOLOG FLEXPEN) 100 UNIT/ML FlexPen, Inject 3 Units into the skin 3 (three) times daily with meals., Disp: 15 mL, Rfl: 0 .  Insulin Pen Needle 31G X 5 MM MISC, Humalog 3 units 3 times a day with meals,  Disp: 100 each, Rfl: 0 .  latanoprost (XALATAN) 0.005 % ophthalmic solution, Place 1 drop into both eyes at bedtime., Disp: 2.5 mL, Rfl: 12 .  lidocaine (LIDODERM) 5 %, Place 1 patch onto the skin daily. Remove & Discard patch within 12 hours or as directed by MD, Disp: 10 patch, Rfl: 0 .  lidocaine-prilocaine (EMLA) cream, Apply 1 application topically as needed. 1 hour before dialysis, Disp: , Rfl: 12 .  methocarbamol (ROBAXIN) 500 MG tablet, Take 1 tablet (500 mg total) by mouth every 6 (six) hours as needed for muscle spasms (back pain)., Disp: 20 tablet, Rfl: 0 .  methocarbamol (ROBAXIN) 500 MG tablet, Take 1 tablet (500 mg total) by mouth every 8 (eight) hours as needed for muscle spasms., Disp: 15 tablet, Rfl: 0 .  multivitamin (RENA-VIT) TABS tablet, Take 1 tablet by mouth at bedtime., Disp: 30 tablet, Rfl: 0 .  ondansetron (ZOFRAN) 4 MG tablet, Take 1 tablet (4 mg total) by mouth every 6 (six) hours as needed for nausea., Disp: 20 tablet, Rfl: 0 .  pantoprazole (PROTONIX) 40 MG tablet, Take 1 tablet (40 mg total) by mouth daily., Disp: 30 tablet, Rfl: 0 .  QUEtiapine (SEROQUEL) 100 MG tablet, Take 1 tablet (100 mg total) by mouth at bedtime., Disp: 30 tablet, Rfl: 0 .  traMADol (ULTRAM) 50 MG tablet, Take 1 tablet (50 mg total) by mouth every 12 (twelve) hours as needed for moderate pain., Disp: 10 tablet, Rfl: 0 .  traZODone (DESYREL) 50 MG tablet, Take 1 tablet (50 mg total) by mouth at bedtime as needed for sleep., Disp: 10 tablet, Rfl: 0   Exam: Current vital signs: BP (!) 155/137   Pulse (!) 104   Temp (!) 89.2 F (31.8 C)   Resp (!) 21   Wt 81.2 kg   SpO2 (!) 78%   BMI 28.04 kg/m  Vital signs in last 24 hours: Temp:  [87.9 F (31.1 C)-89.2 F (31.8 C)] 89.2 F (31.8 C) (12/19 2145) Pulse Rate:  [75-104] 104 (12/19 2145) Resp:  [14-25] 21 (12/19 2145) BP: (140-167)/(88-137) 155/137 (12/19 2145) SpO2:  [78 %-100 %] 78 % (12/19 2145) Weight:  [81.2 kg] 81.2 kg (12/19  2200) General: Obtunded, with shallow breathing. HEENT: Normocephalic, atraumatic, dry and crusted oral mucosa with what appears to be dried vomitus. CVS: Tachycardic Respiratory: Shallow breathing, distant breath sounds Abdomen: Obese, nontender Extremities: Left BKA, dried vomitus versus fecal material in all extremities and on the back.  Left upper extremity AV fistula +thrill Neurological exam Obtunded, opens eyes to noxious stimulation-sternal rub. Does not follow any commands Nonverbal Cranial nerves: Pupils equal round reactive light, difficult to examine extraocular movements, no gaze deviation or preference, right nasolabial fold flattening Motor exam: Continued twitching of the right shoulder and flexion of the right arm, localizes with the right arm to noxious stimulation.  Weak localization with the left arm. Sensory exam: As above Coordination gait cannot be tested   Labs I have reviewed labs in epic and the results pertinent to this consultation are:   CBC    Component Value Date/Time   WBC 14.7 (H) 05/05/2019 2004   RBC 4.18 05/13/2019 2004   HGB 11.2 (L) 05/20/2019 2015   HGB 11.6 (L) 05/25/2019 2015   HCT 33.0 (L) 05/15/2019 2015   HCT 34.0 (L) 05/23/2019 2015   PLT 234 05/13/2019 2004   MCV 80.1 05/04/2019 2004   MCH 23.7 (L) 05/17/2019 2004   MCHC 29.6 (L) 05/29/2019 2004   RDW 21.0 (H) 05/27/2019 2004   LYMPHSABS 1.0 05/10/2019 2004   MONOABS 0.4 05/20/2019 2004   EOSABS 0.0 05/13/2019 2004   BASOSABS 0.0 05/09/2019 2004    CMP     Component Value Date/Time   NA 136 05/14/2019 2015   NA 135 05/15/2019 2015   K 7.7 (HH) 05/29/2019 2015   K 7.7 (HH) 06/02/2019 2015   CL 106 05/20/2019 2015   CO2 10 (L) 05/13/2019 2004   GLUCOSE 418 (H) 05/29/2019 2015   BUN >140 (H) 05/07/2019 2015   CREATININE 15.80 (H) 06/01/2019 2015   CALCIUM 8.7 (L) 05/07/2019 2004   PROT 6.3 (L) 05/04/2019 2004   ALBUMIN 3.3 (L) 05/03/2019 2004   AST 26 05/19/2019 2004    ALT 11 05/22/2019 2004   ALKPHOS 51 05/26/2019 2004   BILITOT 1.1 05/06/2019 2004   GFRNONAA 2 (L) 05/04/2019 2004  GFRAA 3 (L) 05/13/2019 2004   Imaging I have reviewed the images obtained:  CT-scan of the head - no bleed, no ICH. No acute abnormality    Assessment:  53 year old past medical history of CHF, ESRD on hemodialysis Tuesday Thursday Saturday, hypertension, hyperkalemia, 2 L oxygen at home, history of PE and diabetes brought to the hospital after being found down after police were asked to do a welfare check on her as family had not heard from her. She was hypothermic on arrival, had seizure on scene and in route that abated with Versed. Had resumed right shoulder and arm twitching after while in the ER. Mental status remained poor and required intubation. Has had a seizure documented in the chart a month ago when she was in for metabolic encephalopathy again in the setting of deranged renal function. Unsure if she is compliant to medications or treatment.  She was found in the house without her clothes on and the house was empty of its posessions. Unclear what the preceding events to her presentation are. At this time, this could truly be seizures in the setting of deranged electrolytes as well as hypoglycemia and hypothermia and hypoxia. Could also be hypoxic encephalopathy. She has been loaded with Keppra and been given Ativan in the emergency room. She is paralyzed for intubation, and currently is clinically not seizing. On my examination, she was localizing some on the right but was weak on the left, oddly with facial droop on the right.  Impression: Seizure-likely secondary to toxic metabolic encephalopathy Hypothermia Uremia secondary to ESRD Hyperkalemia Hypoxia  Recommendations: Urgent dialysis per nephrology Agree with loading with Keppra Continue Keppra 500 twice daily Continue to correct metabolic derangements, seizures likely related to this.   Next EEG in the AM Frequent neurochecks Check ammonia levels Neurology to continue to follow  -- Amie Portland, MD [facility] Pager: [phone] If 7pm to 7am, please call on call as listed on AMION.  CRITICAL CARE ATTESTATION Performed by: Amie Portland, MD Total critical care time: 45 minutes Critical care time was exclusive of separately billable procedures and treating other patients and/or supervising APPs/Residents/Students Critical care was necessary to treat or prevent imminent or life-threatening deterioration due to seizures, status epilepticus, metabolic encephalopathy  This patient is critically ill and at significant risk for neurological worsening and/or death and care requires constant monitoring. Critical care was time spent personally by me on the following activities: development of treatment plan with patient and/or surrogate as well as nursing, discussions with consultants, evaluation of patient's response to treatment, examination of patient, obtaining history from patient or surrogate, ordering and performing treatments and interventions, ordering and review of laboratory studies, ordering and review of radiographic studies, pulse oximetry, re-evaluation of patient's condition, participation in multidisciplinary rounds and medical decision making of high complexity in the care of this patient.

## 2019-05-21 NOTE — ED Notes (Signed)
Daughter Burman Freestone would like social work to contact her when possible

## 2019-05-21 NOTE — ED Notes (Signed)
Wynona Wallace [id number] daughter looking for an update on the patient

## 2019-05-21 NOTE — ED Triage Notes (Signed)
Pt BIB [facility], daughter called [employer] to do a welfare check on pt because she hadn't heard from her. [employer] found pt lying naked on the floor with seizure like activity, pt covered in feces. Given 5mg  versed IM by EMS. On arrival, pt responsive to pain, no seizure like activity noted.

## 2019-05-21 NOTE — Progress Notes (Signed)
Pharmacy Antibiotic Note  Nikko Mcfadyen is a 53 y.o. female admitted on 05/29/2019 with infection of unknown source.  Pharmacy has been consulted for vancomycin/cefepime dosing. ESRD on HD TTS. Recent weight documented as 179 lbs from 04/14/19.  Plan: Cefepime 2g IV x 1; then 1g IV q24h Vancomycin 1750mg  IV x1; then 1g IV qHD Monitor clinical progress, c/s, abx plan/LOT Pre-HD vancomycin level as indicated F/u HD schedule/tolerance inpatient to enter vancomycin maintenance doses       No data recorded.  Recent Labs  Lab 05/04/2019 2004 05/06/2019 2005 05/04/2019 2015  WBC 14.7*  --   --   CREATININE 15.95*  --  15.80*  LATICACIDVEN  --  1.0  --     CrCl cannot be calculated (Unknown ideal weight.).    Allergies  Allergen Reactions  . Adhesive [Tape] Other (See Comments)    "RIPS MY SKIN," SO PLEASE USE COBAN WRAP!!  . Other Rash    "RIPS MY SKIN," SO PLEASE USE COBAN WRAP!! "RIPS MY SKIN," SO PLEASE USE COBAN WRAP!!  . Hydrocodone-Acetaminophen Other (See Comments)    "Makes me feel crazy"  . Metformin And Related Other (See Comments)    "Makes me feel crazy"    Antimicrobials this admission: 12/19 vancomycin >>  12/19 cefepime >>   Dose adjustments this admission:   Microbiology results:   Elicia Lamp, PharmD, BCPS Clinical Pharmacist 05/26/2019 9:04 PM

## 2019-05-22 ENCOUNTER — Inpatient Hospital Stay (HOSPITAL_COMMUNITY): Payer: Medicare Other

## 2019-05-22 DIAGNOSIS — Z452 Encounter for adjustment and management of vascular access device: Secondary | ICD-10-CM | POA: Diagnosis not present

## 2019-05-22 DIAGNOSIS — N2581 Secondary hyperparathyroidism of renal origin: Secondary | ICD-10-CM | POA: Diagnosis present

## 2019-05-22 DIAGNOSIS — J9611 Chronic respiratory failure with hypoxia: Secondary | ICD-10-CM

## 2019-05-22 DIAGNOSIS — I132 Hypertensive heart and chronic kidney disease with heart failure and with stage 5 chronic kidney disease, or end stage renal disease: Secondary | ICD-10-CM | POA: Diagnosis present

## 2019-05-22 DIAGNOSIS — W1830XA Fall on same level, unspecified, initial encounter: Secondary | ICD-10-CM | POA: Diagnosis not present

## 2019-05-22 DIAGNOSIS — N186 End stage renal disease: Secondary | ICD-10-CM

## 2019-05-22 DIAGNOSIS — Z9911 Dependence on respirator [ventilator] status: Secondary | ICD-10-CM | POA: Diagnosis not present

## 2019-05-22 DIAGNOSIS — G936 Cerebral edema: Secondary | ICD-10-CM | POA: Diagnosis present

## 2019-05-22 DIAGNOSIS — I160 Hypertensive urgency: Secondary | ICD-10-CM | POA: Diagnosis present

## 2019-05-22 DIAGNOSIS — I6783 Posterior reversible encephalopathy syndrome: Secondary | ICD-10-CM | POA: Diagnosis present

## 2019-05-22 DIAGNOSIS — G40901 Epilepsy, unspecified, not intractable, with status epilepticus: Secondary | ICD-10-CM | POA: Diagnosis present

## 2019-05-22 DIAGNOSIS — J9601 Acute respiratory failure with hypoxia: Secondary | ICD-10-CM | POA: Diagnosis present

## 2019-05-22 DIAGNOSIS — R918 Other nonspecific abnormal finding of lung field: Secondary | ICD-10-CM | POA: Diagnosis present

## 2019-05-22 DIAGNOSIS — R569 Unspecified convulsions: Secondary | ICD-10-CM | POA: Diagnosis present

## 2019-05-22 DIAGNOSIS — J9600 Acute respiratory failure, unspecified whether with hypoxia or hypercapnia: Secondary | ICD-10-CM | POA: Diagnosis not present

## 2019-05-22 DIAGNOSIS — F329 Major depressive disorder, single episode, unspecified: Secondary | ICD-10-CM | POA: Diagnosis present

## 2019-05-22 DIAGNOSIS — E875 Hyperkalemia: Secondary | ICD-10-CM | POA: Diagnosis present

## 2019-05-22 DIAGNOSIS — E1011 Type 1 diabetes mellitus with ketoacidosis with coma: Secondary | ICD-10-CM

## 2019-05-22 DIAGNOSIS — L89312 Pressure ulcer of right buttock, stage 2: Secondary | ICD-10-CM | POA: Diagnosis not present

## 2019-05-22 DIAGNOSIS — E10649 Type 1 diabetes mellitus with hypoglycemia without coma: Secondary | ICD-10-CM | POA: Diagnosis not present

## 2019-05-22 DIAGNOSIS — Z515 Encounter for palliative care: Secondary | ICD-10-CM | POA: Diagnosis not present

## 2019-05-22 DIAGNOSIS — R680 Hypothermia, not associated with low environmental temperature: Secondary | ICD-10-CM | POA: Diagnosis present

## 2019-05-22 DIAGNOSIS — G9340 Encephalopathy, unspecified: Secondary | ICD-10-CM | POA: Diagnosis not present

## 2019-05-22 DIAGNOSIS — Z20828 Contact with and (suspected) exposure to other viral communicable diseases: Secondary | ICD-10-CM | POA: Diagnosis present

## 2019-05-22 DIAGNOSIS — Y92009 Unspecified place in unspecified non-institutional (private) residence as the place of occurrence of the external cause: Secondary | ICD-10-CM | POA: Diagnosis not present

## 2019-05-22 DIAGNOSIS — G931 Anoxic brain damage, not elsewhere classified: Secondary | ICD-10-CM | POA: Diagnosis present

## 2019-05-22 DIAGNOSIS — I5032 Chronic diastolic (congestive) heart failure: Secondary | ICD-10-CM | POA: Diagnosis present

## 2019-05-22 DIAGNOSIS — L89322 Pressure ulcer of left buttock, stage 2: Secondary | ICD-10-CM | POA: Diagnosis not present

## 2019-05-22 DIAGNOSIS — I959 Hypotension, unspecified: Secondary | ICD-10-CM | POA: Diagnosis not present

## 2019-05-22 DIAGNOSIS — I340 Nonrheumatic mitral (valve) insufficiency: Secondary | ICD-10-CM

## 2019-05-22 DIAGNOSIS — E1022 Type 1 diabetes mellitus with diabetic chronic kidney disease: Secondary | ICD-10-CM | POA: Diagnosis present

## 2019-05-22 DIAGNOSIS — Z66 Do not resuscitate: Secondary | ICD-10-CM | POA: Diagnosis not present

## 2019-05-22 LAB — BASIC METABOLIC PANEL
Anion gap: 31 — ABNORMAL HIGH (ref 5–15)
Anion gap: 28 — ABNORMAL HIGH (ref 5–15)
Anion gap: 18 — ABNORMAL HIGH (ref 5–15)
Anion gap: 20 — ABNORMAL HIGH (ref 5–15)
Anion gap: 20 — ABNORMAL HIGH (ref 5–15)
Anion gap: 19 — ABNORMAL HIGH (ref 5–15)
BUN: 173 mg/dL — ABNORMAL HIGH (ref 6–20)
BUN: 173 mg/dL — ABNORMAL HIGH (ref 6–20)
BUN: 72 mg/dL — ABNORMAL HIGH (ref 6–20)
BUN: 74 mg/dL — ABNORMAL HIGH (ref 6–20)
BUN: 73 mg/dL — ABNORMAL HIGH (ref 6–20)
BUN: 74 mg/dL — ABNORMAL HIGH (ref 6–20)
CO2: 11 mmol/L — ABNORMAL LOW (ref 22–32)
CO2: 12 mmol/L — ABNORMAL LOW (ref 22–32)
CO2: 21 mmol/L — ABNORMAL LOW (ref 22–32)
CO2: 21 mmol/L — ABNORMAL LOW (ref 22–32)
CO2: 21 mmol/L — ABNORMAL LOW (ref 22–32)
CO2: 21 mmol/L — ABNORMAL LOW (ref 22–32)
Calcium: 8.7 mg/dL — ABNORMAL LOW (ref 8.9–10.3)
Calcium: 8.8 mg/dL — ABNORMAL LOW (ref 8.9–10.3)
Calcium: 8.4 mg/dL — ABNORMAL LOW (ref 8.9–10.3)
Calcium: 8.6 mg/dL — ABNORMAL LOW (ref 8.9–10.3)
Calcium: 8.4 mg/dL — ABNORMAL LOW (ref 8.9–10.3)
Calcium: 8.2 mg/dL — ABNORMAL LOW (ref 8.9–10.3)
Chloride: 100 mmol/L (ref 98–111)
Chloride: 101 mmol/L (ref 98–111)
Chloride: 98 mmol/L (ref 98–111)
Chloride: 99 mmol/L (ref 98–111)
Chloride: 98 mmol/L (ref 98–111)
Chloride: 99 mmol/L (ref 98–111)
Creatinine, Ser: 16.06 mg/dL — ABNORMAL HIGH (ref 0.44–1.00)
Creatinine, Ser: 15.98 mg/dL — ABNORMAL HIGH (ref 0.44–1.00)
Creatinine, Ser: 8.01 mg/dL — ABNORMAL HIGH (ref 0.44–1.00)
Creatinine, Ser: 8.46 mg/dL — ABNORMAL HIGH (ref 0.44–1.00)
Creatinine, Ser: 8.86 mg/dL — ABNORMAL HIGH (ref 0.44–1.00)
Creatinine, Ser: 8.93 mg/dL — ABNORMAL HIGH (ref 0.44–1.00)
GFR calc Af Amer: 3 mL/min — ABNORMAL LOW (ref >60)
GFR calc Af Amer: 3 mL/min — ABNORMAL LOW (ref >60)
GFR calc Af Amer: 6 mL/min — ABNORMAL LOW (ref >60)
GFR calc Af Amer: 6 mL/min — ABNORMAL LOW (ref >60)
GFR calc Af Amer: 5 mL/min — ABNORMAL LOW (ref >60)
GFR calc Af Amer: 5 mL/min — ABNORMAL LOW (ref >60)
GFR calc non Af Amer: 2 mL/min — ABNORMAL LOW (ref >60)
GFR calc non Af Amer: 2 mL/min — ABNORMAL LOW (ref >60)
GFR calc non Af Amer: 5 mL/min — ABNORMAL LOW (ref >60)
GFR calc non Af Amer: 5 mL/min — ABNORMAL LOW (ref >60)
GFR calc non Af Amer: 5 mL/min — ABNORMAL LOW (ref >60)
GFR calc non Af Amer: 5 mL/min — ABNORMAL LOW (ref >60)
Glucose, Bld: 419 mg/dL — ABNORMAL HIGH (ref 70–99)
Glucose, Bld: 395 mg/dL — ABNORMAL HIGH (ref 70–99)
Glucose, Bld: 125 mg/dL — ABNORMAL HIGH (ref 70–99)
Glucose, Bld: 117 mg/dL — ABNORMAL HIGH (ref 70–99)
Glucose, Bld: 141 mg/dL — ABNORMAL HIGH (ref 70–99)
Glucose, Bld: 142 mg/dL — ABNORMAL HIGH (ref 70–99)
Potassium: 7.3 mmol/L (ref 3.5–5.1)
Potassium: 6.4 mmol/L (ref 3.5–5.1)
Potassium: 3.1 mmol/L — ABNORMAL LOW (ref 3.5–5.1)
Potassium: 3.5 mmol/L (ref 3.5–5.1)
Potassium: 3.9 mmol/L (ref 3.5–5.1)
Potassium: 4 mmol/L (ref 3.5–5.1)
Sodium: 142 mmol/L (ref 135–145)
Sodium: 141 mmol/L (ref 135–145)
Sodium: 137 mmol/L (ref 135–145)
Sodium: 140 mmol/L (ref 135–145)
Sodium: 139 mmol/L (ref 135–145)
Sodium: 139 mmol/L (ref 135–145)

## 2019-05-22 LAB — GLUCOSE, CAPILLARY
Glucose-Capillary: 375 mg/dL — ABNORMAL HIGH (ref 70–99)
Glucose-Capillary: 222 mg/dL — ABNORMAL HIGH (ref 70–99)
Glucose-Capillary: 130 mg/dL — ABNORMAL HIGH (ref 70–99)
Glucose-Capillary: 171 mg/dL — ABNORMAL HIGH (ref 70–99)
Glucose-Capillary: 123 mg/dL — ABNORMAL HIGH (ref 70–99)
Glucose-Capillary: 119 mg/dL — ABNORMAL HIGH (ref 70–99)
Glucose-Capillary: 105 mg/dL — ABNORMAL HIGH (ref 70–99)
Glucose-Capillary: 111 mg/dL — ABNORMAL HIGH (ref 70–99)
Glucose-Capillary: 123 mg/dL — ABNORMAL HIGH (ref 70–99)
Glucose-Capillary: 152 mg/dL — ABNORMAL HIGH (ref 70–99)
Glucose-Capillary: 145 mg/dL — ABNORMAL HIGH (ref 70–99)
Glucose-Capillary: 150 mg/dL — ABNORMAL HIGH (ref 70–99)
Glucose-Capillary: 139 mg/dL — ABNORMAL HIGH (ref 70–99)
Glucose-Capillary: 151 mg/dL — ABNORMAL HIGH (ref 70–99)
Glucose-Capillary: 159 mg/dL — ABNORMAL HIGH (ref 70–99)
Glucose-Capillary: 158 mg/dL — ABNORMAL HIGH (ref 70–99)
Glucose-Capillary: 140 mg/dL — ABNORMAL HIGH (ref 70–99)
Glucose-Capillary: 174 mg/dL — ABNORMAL HIGH (ref 70–99)
Glucose-Capillary: 155 mg/dL — ABNORMAL HIGH (ref 70–99)

## 2019-05-22 LAB — CBC
HCT: 29.4 % — ABNORMAL LOW (ref 36.0–46.0)
Hemoglobin: 9.2 g/dL — ABNORMAL LOW (ref 12.0–15.0)
MCH: 23.7 pg — ABNORMAL LOW (ref 26.0–34.0)
MCHC: 31.3 g/dL (ref 30.0–36.0)
MCV: 75.6 fL — ABNORMAL LOW (ref 80.0–100.0)
Platelets: 215 10*3/uL (ref 150–400)
RBC: 3.89 MIL/uL (ref 3.87–5.11)
RDW: 20.2 % — ABNORMAL HIGH (ref 11.5–15.5)
WBC: 14.9 10*3/uL — ABNORMAL HIGH (ref 4.0–10.5)
nRBC: 0.2 % (ref 0.0–0.2)

## 2019-05-22 LAB — CBG MONITORING, ED: Glucose-Capillary: 368 mg/dL — ABNORMAL HIGH (ref 70–99)

## 2019-05-22 LAB — ECHOCARDIOGRAM COMPLETE
Height: 67 in
Weight: 2998.26 oz

## 2019-05-22 LAB — MRSA PCR SCREENING: MRSA by PCR: NEGATIVE

## 2019-05-22 LAB — HEPATITIS B SURFACE ANTIGEN: Hepatitis B Surface Ag: NONREACTIVE

## 2019-05-22 LAB — BETA-HYDROXYBUTYRIC ACID
Beta-Hydroxybutyric Acid: 5.16 mmol/L — ABNORMAL HIGH (ref 0.05–0.27)
Beta-Hydroxybutyric Acid: 2.2 mmol/L — ABNORMAL HIGH (ref 0.05–0.27)
Beta-Hydroxybutyric Acid: 1.34 mmol/L — ABNORMAL HIGH (ref 0.05–0.27)

## 2019-05-22 MED ORDER — GABAPENTIN 250 MG/5ML PO SOLN
100.0000 mg | Freq: Every day | ORAL | Status: DC
  Administered 2019-05-22 – 2019-05-29 (×8): 100 mg
  Filled 2019-05-22 (×8): qty 2

## 2019-05-22 MED ORDER — ASPIRIN 81 MG PO CHEW: 81.0000 mg | CHEWABLE_TABLET | Freq: Every day | ORAL | Status: DC

## 2019-05-22 MED ORDER — CLEVIDIPINE BUTYRATE 0.5 MG/ML IV EMUL
0.0000 mg/h | INTRAVENOUS | Status: DC
  Administered 2019-05-22: 1 mg/h via INTRAVENOUS
  Administered 2019-05-22: 2 mg/h via INTRAVENOUS
  Administered 2019-05-23 (×3): 4 mg/h via INTRAVENOUS
  Administered 2019-05-24: 8 mg/h via INTRAVENOUS
  Administered 2019-05-24: 18:00:00 2 mg/h via INTRAVENOUS
  Administered 2019-05-24: 06:00:00 4 mg/h via INTRAVENOUS
  Administered 2019-05-25: 2 mg/h via INTRAVENOUS
  Administered 2019-05-25 (×2): 4 mg/h via INTRAVENOUS
  Administered 2019-05-26 – 2019-05-27 (×2): 2 mg/h via INTRAVENOUS
  Filled 2019-05-22 (×15): qty 50

## 2019-05-22 MED ORDER — LEVETIRACETAM IN NACL 500 MG/100ML IV SOLN
500.0000 mg | Freq: Two times a day (BID) | INTRAVENOUS | Status: DC
  Administered 2019-05-22 – 2019-05-25 (×8): 500 mg via INTRAVENOUS
  Filled 2019-05-22 (×8): qty 100

## 2019-05-22 MED ORDER — INSULIN REGULAR(HUMAN) IN NACL 100-0.9 UT/100ML-% IV SOLN
INTRAVENOUS | Status: DC
  Administered 2019-05-22: 10.5 [IU]/h via INTRAVENOUS
  Filled 2019-05-22: qty 100

## 2019-05-22 MED ORDER — HEPARIN SODIUM (PORCINE) 5000 UNIT/ML IJ SOLN
5000.0000 [IU] | Freq: Three times a day (TID) | INTRAMUSCULAR | Status: DC
  Administered 2019-05-22 – 2019-05-31 (×26): 5000 [IU] via SUBCUTANEOUS
  Filled 2019-05-22 (×27): qty 1

## 2019-05-22 MED ORDER — SODIUM CHLORIDE 0.9% FLUSH
10.0000 mL | Freq: Two times a day (BID) | INTRAVENOUS | Status: DC
  Administered 2019-05-23 – 2019-05-29 (×10): 10 mL
  Administered 2019-05-30: 10:00:00 40 mL

## 2019-05-22 MED ORDER — CLOPIDOGREL BISULFATE 75 MG PO TABS: 75.0000 mg | ORAL_TABLET | Freq: Every day | ORAL | Status: DC

## 2019-05-22 MED ORDER — ALBUTEROL SULFATE (2.5 MG/3ML) 0.083% IN NEBU
2.5000 mg | INHALATION_SOLUTION | Freq: Four times a day (QID) | RESPIRATORY_TRACT | Status: DC | PRN
  Filled 2019-05-22: qty 3

## 2019-05-22 MED ORDER — PANTOPRAZOLE SODIUM 40 MG PO TBEC: 40.0000 mg | DELAYED_RELEASE_TABLET | Freq: Every day | ORAL | Status: DC

## 2019-05-22 MED ORDER — ASPIRIN 81 MG PO CHEW
81.0000 mg | CHEWABLE_TABLET | Freq: Every day | ORAL | Status: DC
  Administered 2019-05-23 – 2019-05-31 (×9): 81 mg
  Filled 2019-05-22 (×9): qty 1

## 2019-05-22 MED ORDER — VANCOMYCIN HCL IN DEXTROSE 1-5 GM/200ML-% IV SOLN
1000.0000 mg | Freq: Once | INTRAVENOUS | Status: AC
  Administered 2019-05-22: 1000 mg via INTRAVENOUS
  Filled 2019-05-22: qty 200

## 2019-05-22 MED ORDER — CHLORHEXIDINE GLUCONATE CLOTH 2 % EX PADS
6.0000 | MEDICATED_PAD | Freq: Every day | CUTANEOUS | Status: DC
  Administered 2019-05-22 – 2019-05-25 (×5): 6 via TOPICAL

## 2019-05-22 MED ORDER — SODIUM CHLORIDE 0.9% FLUSH: 10.0000 mL | INTRAVENOUS | Status: DC | PRN

## 2019-05-22 MED ORDER — LABETALOL HCL 5 MG/ML IV SOLN
INTRAVENOUS | Status: AC
  Administered 2019-05-22: 20 mg
  Filled 2019-05-22: qty 8

## 2019-05-22 MED ORDER — CLOPIDOGREL BISULFATE 75 MG PO TABS
75.0000 mg | ORAL_TABLET | Freq: Every day | ORAL | Status: DC
  Administered 2019-05-23 – 2019-05-31 (×9): 75 mg
  Filled 2019-05-22 (×9): qty 1

## 2019-05-22 MED ORDER — DEXTROSE 50 % IV SOLN: 0.0000 mL | INTRAVENOUS | Status: DC | PRN

## 2019-05-22 MED ORDER — LATANOPROST 0.005 % OP SOLN
1.0000 [drp] | Freq: Every day | OPHTHALMIC | Status: DC
  Administered 2019-05-22 – 2019-05-30 (×9): 1 [drp] via OPHTHALMIC
  Filled 2019-05-22: qty 2.5

## 2019-05-22 MED ORDER — DEXTROSE 10 % IV SOLN: INTRAVENOUS | Status: DC

## 2019-05-22 MED ORDER — ORAL CARE MOUTH RINSE
15.0000 mL | OROMUCOSAL | Status: DC
  Administered 2019-05-22 – 2019-05-31 (×91): 15 mL via OROMUCOSAL

## 2019-05-22 MED ORDER — FENTANYL CITRATE (PF) 100 MCG/2ML IJ SOLN: 50.0000 ug | INTRAMUSCULAR | Status: DC | PRN

## 2019-05-22 MED ORDER — DEXTROSE-NACL 5-0.45 % IV SOLN: INTRAVENOUS | Status: AC

## 2019-05-22 MED ORDER — PANTOPRAZOLE SODIUM 40 MG PO PACK
40.0000 mg | PACK | Freq: Every day | ORAL | Status: DC
  Administered 2019-05-22 – 2019-05-31 (×10): 40 mg
  Filled 2019-05-22 (×10): qty 20

## 2019-05-22 MED ORDER — INSULIN REGULAR(HUMAN) IN NACL 100-0.9 UT/100ML-% IV SOLN
INTRAVENOUS | Status: DC
  Filled 2019-05-22: qty 100

## 2019-05-22 MED ORDER — LORAZEPAM 2 MG/ML IJ SOLN
INTRAMUSCULAR | Status: AC
  Filled 2019-05-22: qty 1

## 2019-05-22 MED ORDER — CHLORHEXIDINE GLUCONATE 0.12% ORAL RINSE (MEDLINE KIT)
15.0000 mL | Freq: Two times a day (BID) | OROMUCOSAL | Status: DC
  Administered 2019-05-22 – 2019-05-31 (×20): 15 mL via OROMUCOSAL

## 2019-05-22 MED ORDER — SODIUM CHLORIDE 0.9 % IV SOLN
1.0000 g | INTRAVENOUS | Status: AC
  Administered 2019-05-22 – 2019-05-25 (×4): 1 g via INTRAVENOUS
  Filled 2019-05-22 (×4): qty 1

## 2019-05-22 MED ORDER — LABETALOL HCL 5 MG/ML IV SOLN
10.0000 mg | INTRAVENOUS | Status: DC | PRN
  Administered 2019-05-24 – 2019-05-27 (×3): 10 mg via INTRAVENOUS
  Administered 2019-05-27: 20 mg via INTRAVENOUS
  Administered 2019-05-28: 12:00:00 10 mg via INTRAVENOUS
  Administered 2019-05-30 (×2): 20 mg via INTRAVENOUS
  Filled 2019-05-22 (×9): qty 4

## 2019-05-22 MED ORDER — SODIUM CHLORIDE 0.9 % IV BOLUS: 250.0000 mL | Freq: Once | INTRAVENOUS | Status: DC

## 2019-05-22 MED ORDER — SODIUM CHLORIDE 0.9 % IV SOLN: INTRAVENOUS | Status: AC

## 2019-05-22 MED ORDER — GABAPENTIN 100 MG PO CAPS: 100.0000 mg | ORAL_CAPSULE | Freq: Every day | ORAL | Status: DC

## 2019-05-22 NOTE — Progress Notes (Signed)
EEG complete - results pending 

## 2019-05-22 NOTE — Progress Notes (Signed)
PHARMACY - PHYSICIAN COMMUNICATION CRITICAL VALUE ALERT - BLOOD CULTURE IDENTIFICATION (BCID)  Lauren Warner is an 53 y.o. female who presented to [facility] on 05/08/2019 with a chief complaint of seizing with hypothermia.  Assessment:  2 of 4 bottles (1 of 2 sets) with GPC in clusters. No BCID performed at this time per lab.   Name of physician (or Provider) Contacted: Kara Mead  Current antibiotics: Vancomycin and Ceftriaxone  Changes to prescribed antibiotics recommended:  No change recommended at this time.   No results found for this or any previous visit.  Sulma Ruffino A. Levada Dy, PharmD, BCPS, FNKF Clinical Pharmacist [place] Please utilize Amion for appropriate phone number to reach the unit pharmacist ([place])   05/22/2019  4:10 PM

## 2019-05-22 NOTE — Plan of Care (Signed)
Patient's daughter, Carrah Boynes called back with some more questions. Answered them to best of my ability.  I have asked her to call again in the morning to get more details from the critical care team as well as nephrology as she gets emergent dialysis overnight.  She reports that she will be trying to take a flight from [place] to come in to see her mother-sometime first week of January.  I provided her with a number of the neurological ICU and instructed that she speak with the patient's nurse to get updates and asked to speak with the teams in the morning with further updates.  -- Amie Portland, MD

## 2019-05-22 NOTE — Procedures (Signed)
Central Venous Catheter Insertion Procedure Note Lauren Warner [id number] [birth year]  Procedure: Insertion of Central Venous Catheter Indications: Assessment of intravascular volume, Drug and/or fluid administration and Frequent blood sampling  Procedure Details Consent: Unable to obtain consent because of altered level of consciousness. Tried to call daughter for phone consent, but was unable to reach her  Time Out: Verified patient identification, verified procedure, site/side was marked, verified correct patient position, special equipment/implants available, medications/allergies/relevent history reviewed, required imaging and test results available.  Performed  Maximum sterile technique was used including antiseptics, cap, gloves, gown, hand hygiene, mask and sheet. Skin prep: Chlorhexidine; local anesthetic administered A antimicrobial bonded/coated triple lumen catheter was placed in the left internal jugular vein using the Seldinger technique.  Evaluation Blood flow good Complications: No apparent complications Patient did tolerate procedure well. Chest X-ray ordered to verify placement.  CXR: normal.  Lauren Warner Lauren Warner 05/22/2019, 5:33 AM

## 2019-05-22 NOTE — H&P (Signed)
NAME:  Lauren Warner, MRN:  [id number], DOB:  1965-10-05, LOS: 0 ADMISSION DATE:  05/30/2019, CONSULTATION DATE:  05/22/19 REFERRING MD:  , CHIEF COMPLAINT:  Seizures, respiratory failure   Brief History   53 y.o. F with PMH of ESRD on HD, CHF, CAD, PE, DM who lives alone and daughter was unable to reach her.  Had police do a well check and patient was found seizing and covered in stool.  Seizures stopped after Versed 52m,  Pt intubated in the ED, seen by neurology and loaded with keppra and given Ativan in the ED.  PCCM consulted for admission  History of present illness   Lauren Kintzelis a 53y.o. F with PMH CAD, CHF, ESRD on T/T/Sat HD, PE not on anti-coagulation and Type 2 DM who lives alone and patient's daughter was unable to reach for for several days so sent police to do a well check.  Pt was found down with seizure-like activity and covered in vomit and stool. EMS noted the house was empty and seemed unheated.  She was brought to the ED where poor mental status necessitated intubation for airway protection.   She was seen by nephrology and neurology and loaded with Keppra and given Ativan with no further seizure activity.  Vitals significant for BP 185/87, T 67F, O2 78%.   Labs showed k 7.7, glucose 418 with bicarb 10 and anion gap 30,  WBC 14.7.  CXR with possible R sided infiltrate, Covid-19 negative, CK only mildly elevated at 451.  She received  IV insulin, calcium, 1 amp sodium bicarb and Vanc/Cefepime. Pt remains minimally responsive, CT head is pending and PCCM consulted for admission.  Past Medical History   has a past medical history of Anemia, Anxiety, Asthma, CHF (congestive heart failure) ([facility], Coronary artery disease, Daily headache, Depression, ESRD (end stage renal disease) on dialysis ([place], High cholesterol, History of blood transfusion (10/19/2017), Hyperkalemia (10/2017), Hypertension, On home oxygen therapy, Pneumonia, [place]monary embolism [place] ([place]), and Type 1  diabetes mellitus ([facility].   Significant Hospital Events   12/20 admit to ICU  Consults:  Nephrology Neurology  Procedures:  12/20 ETT  Significant Diagnostic Tests:  12/20 CXR>> Cardiomegaly with findings concerning for pulmonary edema. New 9 mm density projecting over the right upper lung zone. This could represent a pulmonary nodule or focal infiltrate. 12/20 CT Head>>  Micro Data:  12/20 BCx2>> 12/20 Sars-Cov-2>>negative  Antimicrobials:  Vancomycin 12/19- Cefepime 12/19-  Interim history/subjective:  Remains intubated and sedated, no further seizure activity  Objective   Blood pressure (!) 184/92, pulse 95, temperature (!) 93 F (33.9 C), resp. rate 16, weight 81.2 kg, SpO2 100 %.    Vent Mode: PRVC FiO2 (%):  [60 %] 60 % Set Rate:  [16 bmp] 16 bmp Vt Set:  [500 mL] 500 mL PEEP:  [5 cmH20] 5 cmH20 Plateau Pressure:  [18 cmH20] 18 cmH20  No intake or output data in the 24 hours ending 05/22/19 0110 Filed Weights   05/20/2019 2200  Weight: 81.2 kg    General:  Chronically ill-appearing F intubated and sedated HEENT: MM pink/moist Neuro: unresponsive to pain, pupils 175mbilaterally and minimally responsive to light, no posturing CV: s1s2 rrr, no m/r/g PULM:  ETT in place, course breath sounds bilaterally without wheezing GI: soft, bsx4 active  Extremities: s/p L ambutation, warm/dry, no edema  Skin: no rashes or lesions   Resolved Hospital Problem list   none  Assessment & Plan:   Seizure with neurologic  respiratory failure -had similar seizure last admission thought likely secondary to metabolic derangement, not on home anti-epileptic medication -intubated for airway protection -unknown down-time, daughter lives out of state and had not spoken to her in almost a week -CK minimally elevated -CT with only scalp hematomas -loaded with Keppra in the ED and neurology following P: -suspect this may again be seizure activity secondary to likely missed HD and  metabolic derangement  -appreciate Neurology recommendations, continue Keppra, seizure precautions  -Maintain full vent support with SAT/SBT as tolerated -titrate Vent setting to maintain SpO2 greater than or equal to 90%. -HOB elevated 30 degrees. -Plateau pressures less than 30 cm H20.  -Follow chest x-ray, ABG prn.   -Bronchial hygiene and RT/bronchodilator protocol.    ESRD, hyperkalemia  -likely missed dialysis, K 7.7  -seen by nephrology, to get emergent dialysis tonight P: -appreciate nephrology recommendations, follow electrolytes and volume status   AGMA, likely secondary to Type 1 DM in DKA and uremia  -pH 7.26, received calcium, insulin and 1 amp bicarb in the ED -suspect patient is not using insulin at home P: -initiate insulin gtt with 250cc bolus and then 75cc/hr for 5 hrs -NPO -repeat VBG in several hours    Possible CAP -poorly visualized 59m opacity vs nodule in the RUL P: -given leukocytosis will treat empirically with Vanc/Cefepime pending blood cultures -Covid and flu negative     History of CAD, CHF, HTN -Trop minimally elevated and flat P: -check echocardiogram -continue plavix and Asa, do not see any anti-hypertensives on home med list, may need to initiate BP control      Best practice:  Diet: NPO Pain/Anxiety/Delirium protocol (if indicated): propofol and fentanyl VAP protocol (if indicated): yes DVT prophylaxis: heparin, SCD GI prophylaxis: PPI Glucose control: insulin gtt Mobility: bed rest Code Status: full Family Communication: just spoke to neurology Disposition: ICU  Labs   CBC: Recent Labs  Lab 05/23/2019 2004 05/26/2019 2015 05/13/2019 2328  WBC 14.7*  --   --   NEUTROABS 13.1*  --   --   HGB 9.9* 11.6*  11.2* 10.5*  HCT 33.5* 34.0*  33.0* 31.0*  MCV 80.1  --   --   PLT 234  --   --     Basic Metabolic Panel: Recent Labs  Lab 05/17/2019 2004 05/17/2019 2015 05/11/2019 2328  NA 140 135  136 138  K >7.5* 7.7*  7.7*  6.4*  CL 99 106  --   CO2 10*  --   --   GLUCOSE 446* 418*  --   BUN 174* >140*  --   CREATININE 15.95* 15.80*  --   CALCIUM 8.7*  --   --    GFR: Estimated Creatinine Clearance: 4.6 mL/min (A) (by C-G formula based on SCr of 15.8 mg/dL (H)). Recent Labs  Lab 05/20/2019 2004 05/20/2019 2005  WBC 14.7*  --   LATICACIDVEN  --  1.0    Liver Function Tests: Recent Labs  Lab 05/12/2019 2004  AST 26  ALT 11  ALKPHOS 51  BILITOT 1.1  PROT 6.3*  ALBUMIN 3.3*   No results for input(s): LIPASE, AMYLASE in the last 168 hours. Recent Labs  Lab 05/08/2019 2004  AMMONIA 35    ABG    Component Value Date/Time   PHART 7.264 (L) 05/14/2019 2328   PCO2ART 29.1 (L) 05/19/2019 2328   PO2ART 212.0 (H) 05/16/2019 2328   HCO3 13.8 (L) 05/22/2019 2328   TCO2 15 (L) 05/30/2019 2328  ACIDBASEDEF 13.0 (H) 05/18/2019 2328   O2SAT 100.0 05/24/2019 2328     Coagulation Profile: Recent Labs  Lab 05/13/2019 2004  INR 1.2    Cardiac Enzymes: Recent Labs  Lab 05/05/2019 2004  CKTOTAL 451*    HbA1C: Hgb A1c MFr Bld  Date/Time Value Ref Range Status  04/06/2019 11:07 AM 9.6 (H) 4.8 - 5.6 % Final    Comment:    (NOTE) Pre diabetes:          5.7%-6.4% Diabetes:              >6.4% Glycemic control for   <7.0% adults with diabetes   03/10/2019 04:07 AM 8.5 (H) 4.8 - 5.6 % Final    Comment:    (NOTE) Pre diabetes:          5.7%-6.4% Diabetes:              >6.4% Glycemic control for   <7.0% adults with diabetes     CBG: Recent Labs  Lab 05/22/19 0037  GLUCAP 368*    Review of Systems:   Unable to obtain secondary to AMS  Past Medical History  She,  has a past medical history of Anemia, Anxiety, Asthma, CHF (congestive heart failure) ([place]), Coronary artery disease, Daily headache, Depression, ESRD (end stage renal disease) on dialysis ([place]), High cholesterol, History of blood transfusion (10/19/2017), Hyperkalemia (10/2017), Hypertension, On home oxygen therapy, Pneumonia,  Pulmonary embolism ([place]) (2012), and Type 1 diabetes mellitus ([place]).   Surgical History    Past Surgical History:  Procedure Laterality Date  . AV FISTULA PLACEMENT Left 2018  . CESAREAN SECTION  1989  . CORONARY ANGIOPLASTY WITH STENT PLACEMENT  ~ 08/2017   "3 stents" (10/19/2017)  . ENDOMETRIAL ABLATION  ~ 2011     Social History   reports that she has never smoked. She has never used smokeless tobacco. She reports that she does not drink alcohol or use drugs.   Family History   Her family history includes Stroke in her sister.   Allergies Allergies  Allergen Reactions  . Adhesive [Tape] Other (See Comments)    "RIPS MY SKIN," SO PLEASE USE COBAN WRAP!!  . Other Rash    "RIPS MY SKIN," SO PLEASE USE COBAN WRAP!! "RIPS MY SKIN," SO PLEASE USE COBAN WRAP!!  . Hydrocodone-Acetaminophen Other (See Comments)    "Makes me feel crazy"  . Metformin And Related Other (See Comments)    "Makes me feel crazy"     Home Medications  Prior to Admission medications   Medication Sig Start Date End Date Taking? Authorizing Provider  albuterol (PROAIR HFA) 108 (90 Base) MCG/ACT inhaler Inhale 2 puffs into the lungs every 6 (six) hours as needed for wheezing or shortness of breath. 12/04/17   Roxan Hockey, MD  aspirin EC 81 MG tablet Take 1 tablet (81 mg total) by mouth daily. With food 12/04/17   Roxan Hockey, MD  atomoxetine (STRATTERA) 40 MG capsule Take 40 mg by mouth daily.    [provider]  blood glucose meter kit and supplies KIT Dispense based on patient and insurance preference. Use up to four times daily as directed. (FOR ICD-9 250.00, 250.01). 04/08/19   Aline August, MD  Blood Pressure KIT Check blood pressure once a day till evaluated by PCP 04/08/19   Aline August, MD  calcitRIOL (ROCALTROL) 0.25 MCG capsule Take 1 capsule (0.25 mcg total) by mouth Every Tuesday,Thursday,and Saturday with dialysis. 04/09/19   Aline August, MD  calcium  acetate (PHOSLO) 667 MG  capsule Take 1 capsule (667 mg total) by mouth 3 (three) times daily with meals. 04/08/19   Aline August, MD  clopidogrel (PLAVIX) 75 MG tablet Take 1 tablet (75 mg total) by mouth daily. 04/08/19   Aline August, MD  escitalopram (LEXAPRO) 10 MG tablet Take 1 tablet (10 mg total) by mouth daily. 04/08/19   Aline August, MD  gabapentin (NEURONTIN) 100 MG capsule Take 1 capsule (100 mg total) by mouth 3 (three) times daily. 04/08/19   Aline August, MD  insulin aspart (NOVOLOG FLEXPEN) 100 UNIT/ML FlexPen Inject 3 Units into the skin 3 (three) times daily with meals. 04/08/19   Aline August, MD  Insulin Pen Needle 31G X 5 MM MISC Humalog 3 units 3 times a day with meals 04/08/19   Aline August, MD  latanoprost (XALATAN) 0.005 % ophthalmic solution Place 1 drop into both eyes at bedtime. 12/04/17   Roxan Hockey, MD  lidocaine (LIDODERM) 5 % Place 1 patch onto the skin daily. Remove & Discard patch within 12 hours or as directed by MD 04/09/19   Aline August, MD  lidocaine-prilocaine (EMLA) cream Apply 1 application topically as needed. 1 hour before dialysis 02/11/18   [provider]  methocarbamol (ROBAXIN) 500 MG tablet Take 1 tablet (500 mg total) by mouth every 6 (six) hours as needed for muscle spasms (back pain). 04/08/19   Aline August, MD  methocarbamol (ROBAXIN) 500 MG tablet Take 1 tablet (500 mg total) by mouth every 8 (eight) hours as needed for muscle spasms. 04/15/19   Cristal Ford, DO  multivitamin (RENA-VIT) TABS tablet Take 1 tablet by mouth at bedtime. 04/08/19   Aline August, MD  ondansetron (ZOFRAN) 4 MG tablet Take 1 tablet (4 mg total) by mouth every 6 (six) hours as needed for nausea. 04/08/19   Aline August, MD  pantoprazole (PROTONIX) 40 MG tablet Take 1 tablet (40 mg total) by mouth daily. 04/08/19 05/08/19  Aline August, MD  QUEtiapine (SEROQUEL) 100 MG tablet Take 1 tablet (100 mg total) by mouth at bedtime. 04/08/19   Aline August, MD  traMADol  (ULTRAM) 50 MG tablet Take 1 tablet (50 mg total) by mouth every 12 (twelve) hours as needed for moderate pain. 04/15/19   Mikhail, Velta Addison, DO  traZODone (DESYREL) 50 MG tablet Take 1 tablet (50 mg total) by mouth at bedtime as needed for sleep. 04/08/19   Aline August, MD     Critical care time: 62 minutes       CRITICAL CARE Performed by: Otilio Carpen Julietta Batterman   Total critical care time: 62  minutes  Critical care time was exclusive of separately billable procedures and treating other patients.  Critical care was necessary to treat or prevent imminent or life-threatening deterioration.  Critical care was time spent personally by me on the following activities: development of treatment plan with patient and/or surrogate as well as nursing, discussions with consultants, evaluation of patient's response to treatment, examination of patient, obtaining history from patient or surrogate, ordering and performing treatments and interventions, ordering and review of laboratory studies, ordering and review of radiographic studies, pulse oximetry and re-evaluation of patient's condition.   Otilio Carpen Keandra Medero, PA-C [facility]  Pager# [phone], if no answer [phone]

## 2019-05-22 NOTE — Progress Notes (Signed)
Reason for consult: Encephalopathy/Seizure  Subjective: Patient returned from dialysis and when weaned off sedation patient noted to have unresponsive pupils, felt to be posturing.  Stat CT head was repeated now showing bilateral hypodensities suggestive for acute infarction versus posterior reversible encephalopathy syndrome.   ROS: unable to obtain due to mental status   Examination  Vital signs in last 24 hours: Temp:  [87.9 F (31.1 C)-98.4 F (36.9 C)] 97.5 F (36.4 C) (12/20 0800) Pulse Rate:  [75-106] 106 (12/20 0800) Resp:  [8-25] 24 (12/20 0800) BP: (76-185)/(54-137) 153/78 (12/20 0800) SpO2:  [78 %-100 %] 100 % (12/20 0800) FiO2 (%):  [40 %-60 %] 40 % (12/20 0751) Weight:  [81.2 kg-85 kg] 85 kg (12/20 0550)  General: lying in bed CVS: pulse-normal rate and rhythm RS: breathing comfortably over ventilator Extremities: normal   Neuro: Mental Status: Patient does not respond to verbal stimuli.  Does not respond to deep sternal rub.  Does not follow commands.  No verbalizations noted.   Cranial Nerves: [id number] 33mm bilaterally, round, non reactive III,IV,VI: doll's response absent bilaterally. V,VII: corneal reflex: absent  VIII: patient does not respond to verbal stimuli IX,X: cough reflex present  XI: trapezius strength unable to test bilaterally XII: tongue strength unable to test Motor: flexor posturing to noxious stimuli  Sensory: Does not respond to noxious stimuli in any extremity. Deep Tendon Reflexes:  Absent throughout. Plantars: mute  Cerebellar: Unable to perform  Basic Metabolic Panel: Recent Labs  Lab 05/10/2019 2002/09/12 05/19/2019 11-Sep-2013 05/10/2019 2328 05/22/19 0026 05/22/19 0225 05/22/19 0634 05/22/19 0840  NA 140 135  136 138 142 141 137 140  K >7.5* 7.7*  7.7* 6.4* 7.3* 6.4* 3.1* 3.5  CL 99 106  --  100 101 98 99  CO2 10*  --   --  11* 12* 21* 21*  GLUCOSE 446* 418*  --  419* 395* 125* 117*  BUN 174* >140*  --  173* 173* 72* 74*  CREATININE  15.95* 15.80*  --  16.06* 15.98* 8.01* 8.46*  CALCIUM 8.7*  --   --  8.7* 8.8* 8.4* 8.6*    CBC: Recent Labs  Lab 05/25/2019 Sep 12, 2002 05/17/2019 2015 05/28/2019 2328 05/22/19 0225  WBC 14.7*  --   --  14.9*  NEUTROABS 13.1*  --   --   --   HGB 9.9* 11.6*  11.2* 10.5* 9.2*  HCT 33.5* 34.0*  33.0* 31.0* 29.4*  MCV 80.1  --   --  75.6*  PLT 234  --   --  215     Coagulation Studies: Recent Labs    05/28/2019 09-12-2002  LABPROT 15.4*  INR 1.2    Imaging Reviewed:     ASSESSMENT AND PLAN  53 year old female with past medical history of CHF, end-stage renal disease on HD, hypertension, hyperlipidemia, history of PE and diabetes mellitus found on the floor unresponsive by police after family had not heard from her. Patient hypothermic, hypoxic, noted to have seizure in route which abated with Versed and intubated on arrival.  Patient loaded with Keppra and neurology was consulted.  Work-up in ED revealed CT head which was unremarkable BUN which was 140, noted to be hyperkalemic and received emergent dialysis.  Ammonia level normal.   Uremic encephalopathy/Coma Seizures Cerebral edema with bilateral infarctions on CT head : D/D posterior reversible encephalopathy syndrome vs watershed infarctions vs hypoxic injury Coma on presentation  Recommendations MRI brain w/o contrast when stable BP goal : [id number] systolic, avoid hypotension vs  hypertension  Stat routine EEG ordered: We will review Continue Keppra 500 mg twice daily ( renally dosed) Consider using alternative antibiotic to cefepime as this can induce/worsen encephalopathy Continue to treat metabolic abnormalities Frequent neurochecks every 2 hours Seizure precautions  CRITICAL CARE Performed by: Lanice Schwab Denym Christenberry   Total critical care time: 45 minutes  Critical care time was exclusive of separately billable procedures and treating other patients.  Critical care was necessary to treat or prevent imminent or  life-threatening deterioration.  Critical care was time spent personally by me on the following activities: development of treatment plan with patient and/or surrogate as well as nursing, discussions with consultants, evaluation of patient's response to treatment, examination of patient, obtaining history from patient or surrogate, ordering and performing treatments and interventions, ordering and review of laboratory studies, ordering and review of radiographic studies, pulse oximetry and re-evaluation of patient's condition.   Karena Addison Courtnie Brenes [facility] Pager Number [phone] For questions after 7pm please refer to AMION to reach the Neurologist on call

## 2019-05-22 NOTE — Progress Notes (Signed)
Inpatient Diabetes Program Recommendations  AACE/ADA: New Consensus Statement on Inpatient Glycemic Control (2015)  Target Ranges:  Prepandial:   less than 140 mg/dL      Peak postprandial:   less than 180 mg/dL (1-2 hours)      Critically ill patients:  140 - 180 mg/dL   Results for CAMAY, FINAMORE (MRN [id number]) as of 05/22/2019 11:27  Ref. Range 05/10/2019 20:04  Sodium Latest Ref Range: 135 - 145 mmol/L 140  Potassium Latest Ref Range: 3.5 - 5.1 mmol/L >7.5 (HH)  Chloride Latest Ref Range: 98 - 111 mmol/L 99  CO2 Latest Ref Range: 22 - 32 mmol/L 10 (L)  Glucose Latest Ref Range: 70 - 99 mg/dL 446 (H)  BUN Latest Ref Range: 6 - 20 mg/dL 174 (H)  Creatinine Latest Ref Range: 0.44 - 1.00 mg/dL 15.95 (H)  Calcium Latest Ref Range: 8.9 - 10.3 mg/dL 8.7 (L)  Anion gap Latest Ref Range: 5 - 15  31 (H)    Admit with: DKA/ Hyperkalemia/Seizures (found seizing and covered in stool after police performed wellness check)  History: DM, ESRD, CHF  Home DM Meds: Novolog 3 units TID  Current Orders: IV Insulin Drip     MD- Note patient Intubated.  IV Insulin Drip running.  Concerned that patient does not have any Dextrose with IVF or tube feeds and IV insulin drip still running.    Not sure if patient ready to transition to SQ Insulin??  Co2 level is now 21 but Anion gap still 20.  Not sure if anion gap still elevated due to ESRD?  Please consider adding a low rate of IVF with Dextrose while patient remains on the IV Insulin drip.       --Will follow patient during hospitalization--  Wyn Quaker RN, MSN, CDE Diabetes Coordinator Inpatient Glycemic Control Team Team Pager: [phone] (8a-5p)

## 2019-05-22 NOTE — Progress Notes (Signed)
RT NOTES: Transported patient to MRI and back to room 4N27 on vent without incident.

## 2019-05-22 NOTE — Progress Notes (Signed)
NAME:  Lauren Warner, MRN:  [id number], DOB:  1966-01-24, LOS: 0 ADMISSION DATE:  05/24/2019, CONSULTATION DATE:  05/22/19 REFERRING MD:  , CHIEF COMPLAINT:  Seizures, respiratory failure   Brief History   53 y.o. F with PMH of ESRD on HD, CHF, CAD, PE, DM BIBEMS after being found seizing with hypothermia, right-sided lung infiltrate and DKA. She had missed dialysis for several days and gave a 7.7    Past Medical History   has a past medical history of Anemia, Anxiety, Asthma, CHF (congestive heart failure) ([place]), Coronary artery disease, Daily headache, Depression, ESRD (end stage renal disease) on dialysis ([place]), High cholesterol, History of blood transfusion (10/19/2017), Hyperkalemia (10/2017), Hypertension, On home oxygen therapy, Pneumonia, Pulmonary embolism ([place]) (2012), and Type 1 diabetes mellitus ([place]).   Significant Hospital Events   12/20 admit to ICU  Consults:  Nephrology Neurology  Procedures:  12/20 ETT >> [facility] 12/20 >>  Significant Diagnostic Tests:  12/20 initial head CT >> neg 12/20 CT Head>> rapidly worsening scan with new cytotoxic edema along bilateral cerebral convexities with infarcts  Micro Data:  12/20 BCx2>> 12/20 Sars-Cov-2>>negative  Antimicrobials:  Vancomycin 12/19- Cefepime 12/19-  Interim history/subjective:   Critically ill, intubated To arrival to the ICU noted to be posturing hence emergent head CT obtained On insulin drip  Objective   Blood pressure (!) 173/87, pulse 95, temperature 99 F (37.2 C), resp. rate 15, height 5\' 7"  (1.702 m), weight 85 kg, SpO2 100 %.    Vent Mode: PRVC FiO2 (%):  [40 %-60 %] 40 % Set Rate:  [16 bmp-24 bmp] 24 bmp Vt Set:  [480 mL-500 mL] 480 mL PEEP:  [5 cmH20] 5 cmH20 Plateau Pressure:  [17 cmH20-18 cmH20] 17 cmH20   Intake/Output Summary (Last 24 hours) at 05/22/2019 1230 Last data filed at 05/22/2019 1114 Gross per 24 hour  Intake 28.63 ml  Output 0 ml  Net 28.63 ml   Filed Weights   05/03/2019 2200 05/22/19 0210 05/22/19 0550  Weight: 81.2 kg 85 kg 85 kg    General:  Chronically ill-appearing F intubated and sedated HEENT: MM pink/moist , mild pallor, no icterus Neuro: Comatose, GCS 3, flexion posturing to deep pain stimulus, no meningeal signs CV: s1s2 rrr, no m/r/g PULM:  ETT in place, course breath sounds bilaterally without wheezing GI: soft, bsx4 active  Extremities: s/p L ambutation, warm/dry, no edema , left arm thrill over fistula Skin: no rashes or lesions  Chest x-ray 12/20 personally reviewed which shows improved bilateral interstitial opacities  Labs show normalized potassium, beta hydroxy 2.2, mild leukocytosis   Resolved Hospital Problem list   hyperkalemia   Assessment & Plan:   Acute encephalopathy due to seizures Now posturing and CT suggestive of PR ES versus post anoxic -had similar seizure last admission thought likely secondary to metabolic derangement, not on home anti-epileptic medication -unknown down-time, daughter lives out of state and had not spoken to her in almost a week P:  -appreciate Neurology recommendations, continue Keppra, seizure precautions  -Stat EEG and  MRI brain  Acute respiratory failure-intubated for airway protection -Maintain full vent support with SAT/SBT as tolerated -HOB elevated 30 degrees. -Bronchial hygiene and RT/bronchodilator protocol.    ESRD,  -likely missed dialysis, K 7.7   P: -Urgently dialyzed, hyperkalemia resolved  DKA  secondary to Type 1 DM -pH 7.26, received calcium, insulin and 1 amp bicarb in the ED -suspect patient is not using insulin at home P: -Insulin drip per Endo tool -  Can come off drip once b-HB normalizes -We will add dextrose drip since CBGs low   Possible CAP -poorly visualized 82mm opacity vs nodule in the RUL More likely infiltrates related to volume but cannot rule out aspiration, also note hypothermia on admit -Covid and flu negative  P: -given leukocytosis  will treat empirically with Vanc pending blood cultures -Changed from cefepime to ceftriaxone to avoid neurotoxicity  Hypertensive urgency History of CAD, CHF, HTN -Trop minimally elevated and flat P: -check echocardiogram -continue plavix and Asa -Use clevidipine for aggressive blood pressure control   Summary-emergently dialyzed, blood pressure controlled, DKA being treated with insulin drip, concern now is for poor mental status with head CT indicating PR ES versus anoxic injury   Best practice:  Diet: NPO Pain/Anxiety/Delirium protocol (if indicated):  Fentanyl prn VAP protocol (if indicated): yes DVT prophylaxis: heparin, SCD GI prophylaxis: PPI Glucose control: insulin gtt Mobility: bed rest Code Status: full Family Communication: Updated by neurology Disposition: ICU  The patient is critically ill with multiple organ systems failure and requires high complexity decision making for assessment and support, frequent evaluation and titration of therapies, application of advanced monitoring technologies and extensive interpretation of multiple databases. Critical Care Time devoted to patient care services described in this note independent of APP/resident  time is 35 minutes.   Kara Mead MD. [place]. [facility]  If no response to pager , please call [phone]   05/22/2019

## 2019-05-22 NOTE — Progress Notes (Signed)
RT NOTES: Transferred patient to CT and back to room 4N27 without incident.

## 2019-05-22 NOTE — Progress Notes (Addendum)
[facility] Progress Note   Subjective:   Patient seen and examined at bedside.  Intubated and sedated.  Objective Vitals:   05/22/19 1315 05/22/19 1330 05/22/19 1345 05/22/19 1400  BP: 127/63 (!) 110/58 140/79 119/61  Pulse:      Resp: (!) 24 (!) 24 (!) 21 (!) 24  Temp: 99.3 F (37.4 C) 99.5 F (37.5 C) 99.3 F (37.4 C) 99.3 F (37.4 C)  TempSrc:      SpO2:      Weight:      Height:       Physical Exam General:chronically ill appearing female in NAD, intubated and sedated Heart:RRR, +4/0 systolic murmur Lungs:grossly clear Abdomen:soft, NTND Extremities:L AKA, R - no edema Dialysis Access: LU AVF +b   Filed Weights   05/18/2019 2200 05/22/19 0210 05/22/19 0550  Weight: 81.2 kg 85 kg 85 kg    Intake/Output Summary (Last 24 hours) at 05/22/2019 1439 Last data filed at 05/22/2019 1114 Gross per 24 hour  Intake 28.63 ml  Output 0 ml  Net 28.63 ml    Additional Objective Labs: Basic Metabolic Panel: Recent Labs  Lab 05/22/19 0634 05/22/19 0840 05/22/19 1200  NA 137 140 139  K 3.1* 3.5 3.9  CL 98 99 98  CO2 21* 21* 21*  GLUCOSE 125* 117* 141*  BUN 72* 74* 73*  CREATININE 8.01* 8.46* 8.86*  CALCIUM 8.4* 8.6* 8.4*   Liver Function Tests: Recent Labs  Lab 05/29/2019 2004  AST 26  ALT 11  ALKPHOS 51  BILITOT 1.1  PROT 6.3*  ALBUMIN 3.3*   CBC: Recent Labs  Lab 05/28/2019 2004 05/26/2019 2015 05/29/2019 2328 05/22/19 0225  WBC 14.7*  --   --  14.9*  NEUTROABS 13.1*  --   --   --   HGB 9.9* 11.6*  11.2* 10.5* 9.2*  HCT 33.5* 34.0*  33.0* 31.0* 29.4*  MCV 80.1  --   --  75.6*  PLT 234  --   --  215   Blood Culture    Component Value Date/Time   SDES BLOOD SITE NOT SPECIFIED 05/10/2019 2125   SPECREQUEST  05/06/2019 2125    BOTTLES DRAWN AEROBIC AND ANAEROBIC Blood Culture adequate volume   CULT  05/24/2019 2125    NO GROWTH < 24 HOURS Performed at [facility], [address], [place], [place] [address]    REPTSTATUS  PENDING 05/27/2019 2125    Cardiac Enzymes: Recent Labs  Lab 05/25/2019 2004  CKTOTAL 451*   CBG: Recent Labs  Lab 05/22/19 0855 05/22/19 1007 05/22/19 1113 05/22/19 1159 05/22/19 1305  GLUCAP 105* 111* 123* 152* 145*   Studies/Results: EEG  Result Date: 05/22/2019 [name], MD     05/22/2019  1:23 PM ELECTROENCEPHALOGRAM REPORT Patient: Lauren Warner       Room #: 4N27C EEG No. ID: [id number] Age: 53 y.o.        Sex: female Referring Physician: [name] Report Date:  05/22/2019       Interpreting Physician: [name] History: Lauren Warner is an 53 y.o. female found down with seizure like activity.  Medications: ASA, Vancomycin, Rocephin, Plavix, Neurontin, Keppra, Vancomycin, Cleviprex, Insulin, Diprovan Conditions of Recording:  This is a 21 channel routine scalp EEG performed with bipolar and monopolar montages arranged in accordance to the international 10/20 system of electrode placement. One channel was dedicated to EKG recording. The patient is in the intubated and sedated state. Description:  The background activity is slow and poorly  organized.  It consists of a low voltage polymorphic delta activity that is continuous and diffusely distributed.  There is no activation of the background noted with stimulation.  No epileptiform activity is noted.  Hyperventilation and intermittent photic stimulation were not performed. IMPRESSION: This is an abnormal EEG secondary to general background slowing.  This finding may be seen with a diffuse disturbance that is etiologically nonspecific, but may include a metabolic encephalopathy or medication effect, among other possibilities.  No epileptiform activity was noted.  [name], MD Neurology [phone] 05/22/2019, 1:17 PM   CT HEAD WO CONTRAST  Result Date: 05/22/2019 CLINICAL DATA:  Admitted with seizure and renal failure. Abnormal neural exam-appended EXAM: CT HEAD WITHOUT CONTRAST TECHNIQUE: Contiguous axial images were  obtained from the base of the skull through the vertex without intravenous contrast. COMPARISON:  CT earlier today FINDINGS: Brain: Worsening scan with cytotoxic edema appearance along the bilateral cerebral convexities, greater on the right. There is more well-defined low-density in the bilateral caudate head which is more likely acute than from chronic small vessel ischemia given the evolution. Diffuse effacement of sulci compared to prior. The lateral ventricles are also narrower than expected. No herniation or midline shift. This is likely infarcts with pattern suggesting global insult. The patient is hypertensive but rapid development and anterior circulation extent makes me favor posterior reversible encephalopathy syndrome less. Negative for acute hemorrhage. Vascular: Atherosclerotic calcification Skull: Generalized scalp swelling. Question long-standing dependent positioning given the left-sided asymmetry and presenting symptoms. Sinuses/Orbits: Nasopharyngeal fluid in the setting of intubation. Bilateral cataract resection. Other: These results were called by telephone at the time of interpretation on 05/22/2019 at 11:11 am to provider [name] , who verbally acknowledged these results. IMPRESSION: Rapidly worsening scan with new cytotoxic edema appearance along the right more than left cerebral convexity and at the anterior caudate nuclei. Suspect global anoxic injury with infarcts. There is related sulcal and ventricular effacement without herniation or shift. Electronically Signed   By: [name]   On: 05/22/2019 11:14   CT Head Wo Contrast  Result Date: 05/22/2019 CLINICAL DATA:  Cerebral hemorrhage suspected Found on floor. Seizure-like activity. EXAM: CT HEAD WITHOUT CONTRAST TECHNIQUE: Contiguous axial images were obtained from the base of the skull through the vertex without intravenous contrast. COMPARISON:  Head CT 04/12/2019 FINDINGS: Brain: No intracranial hemorrhage, mass  effect, or midline shift. No hydrocephalus. The basilar cisterns are patent. No evidence of territorial infarct or acute ischemia. Gray-white differentiation is preserved. No extra-axial or intracranial fluid collection. Vascular: Atherosclerosis of skullbase vasculature without hyperdense vessel or abnormal calcification. Skull: No fracture or focal lesion. Sinuses/Orbits: Retained secretions likely related to intubation. Mastoid air cells are clear. Other: Low densities scalp subgaleal collections involving the right occiput, right parietal region, left temporal frontal region. These are new from prior exam. IMPRESSION: 1. No acute intracranial abnormality. No skull fracture. 2. Low-density scalp subgaleal collections, significance uncertain. These are new from 04/12/2019 CT, may represent liquefying scalp hematomas. No surrounding inflammatory change to suggest infection. Electronically Signed   By: Keith Rake M.D.   On: 05/22/2019 01:42   DG CHEST PORT 1 VIEW  Result Date: 05/22/2019 CLINICAL DATA:  Central line placement. EXAM: PORTABLE CHEST 1 VIEW COMPARISON:  Radiograph yesterday FINDINGS: Left internal jugular central venous catheter tip projects over the mid SVC. No pneumothorax. Improved aeration from exam yesterday with decreasing interstitial opacities. Endotracheal tube and enteric tubes in place. Improved cardiomegaly. No visualized pleural effusion. IMPRESSION: 1. Left  internal jugular central venous catheter tip projects over the mid SVC. No pneumothorax. 2. Improved aeration from exam yesterday with decreasing interstitial opacities. 3. Improved cardiomegaly. Electronically Signed   By: Keith Rake M.D.   On: 05/22/2019 05:26   DG Chest Portable 1 View  Result Date: 05/23/2019 CLINICAL DATA:  Post intubation EXAM: PORTABLE CHEST 1 VIEW COMPARISON:  May 21, 2019 FINDINGS: The heart size and mediastinal contours are unchanged from prior exam. ET tube is 1.6 cm above the  level of the carina. NG tube is seen coursing below the diaphragm. There is prominent interstitial markings seen throughout both lungs. The 9 mm pulmonary nodular opacity in the right upper lung is not as well visualized on this examination. There is elevation of the right hemidiaphragm. No acute osseous abnormality. IMPRESSION: ET tube and NG tube in satisfactory position. Electronically Signed   By: Prudencio Pair M.D.   On: 05/17/2019 23:22   DG Chest Portable 1 View  Result Date: 05/16/2019 CLINICAL DATA:  Altered mental status EXAM: PORTABLE CHEST 1 VIEW COMPARISON:  April 12, 2019 FINDINGS: The heart size is significantly enlarged. Again noted are prominent interstitial lung markings. There is no pneumothorax. May be small bilateral pleural effusions. There is a new focal density measuring approximately 9 mm in the right upper lung zone. There is elevation of the right hemidiaphragm. IMPRESSION: 1. Cardiomegaly with findings concerning for pulmonary edema. 2. New 9 mm density projecting over the right upper lung zone. This could represent a pulmonary nodule or focal infiltrate. A 4-6 week follow-up two-view chest x-ray is recommended to confirm resolution of this finding. Electronically Signed   By: Constance Holster M.D.   On: 05/03/2019 21:34    Medications: . cefTRIAXone (ROCEPHIN)  IV    . clevidipine 1 mg/hr (05/22/19 1222)  . dextrose    . insulin 0.5 Units/hr (05/22/19 1115)  . levETIRAcetam 500 mg (05/22/19 1408)  . propofol (DIPRIVAN) infusion 10 mcg/kg/min (05/20/2019 2301)  . sodium chloride     . [START ON 05/23/2019] aspirin  81 mg Per Tube Daily  . chlorhexidine gluconate (MEDLINE KIT)  15 mL Mouth Rinse BID  . Chlorhexidine Gluconate Cloth  6 each Topical Q0600  . [START ON 05/23/2019] clopidogrel  75 mg Per Tube Daily  . gabapentin  100 mg Oral Daily  . heparin  5,000 Units Subcutaneous Q8H  . latanoprost  1 drop Both Eyes QHS  . mouth rinse  15 mL Mouth Rinse 10 times  per day  . pantoprazole  40 mg Oral Daily  . vancomycin variable dose per unstable renal function (pharmacist dosing)   Does not apply See admin instructions    Dialysis Orders: [facility]  4h 84kg 2/2 L AVF Heparin none L AVF - calc 0.25 tiw -mircera 100q2wks - last 12/12  Assessment/Plan: 1. AMS/Seizure/metabolic encephalopathy - EEG shows no epileptiform activity noted, general background slowing noted which could be from multiple etiologies.  CT suggesting PRES vs anoxic injury vs watershed infarcts.  MRI brain ordered.  2. Hyperkalemia 2/2 non compliance with HD - improved post HD. K  3. Acute Resp Failure w/Hypoxia - intubated 4. ESRD - on HD TTS. Non compliant.  Urgent HD last night due to [place].  Next HD on 12/22. 5. Anemia of CKD- Hgb 9.2. ESA recently dosed, not due until 12/26 6. Secondary hyperparathyroidism - Ca at goal. Hold calcitriol due to being sedated/intubated. 7. HTN/volume - BP now improved.  Pulmonary edema improved on repeat  CXR.  Net UF 65m removed yesterday d/t hypotension.  Continue to titrate down as tolerated.    LJen Mow PA-C [facility] Pager: [phone]/20/2020,2:39 PM  LOS: 0 days   Nephrology attending: Patient was seen and examined at bedside.  Chart reviewed.  I agree with above. Urgent HD yesterday for hyperkalemia.  Potassium has improved. No improvement in mental status and now bacteremic. Evaluate in morning for HD need.  DMarcha Dutton MD [facility].

## 2019-05-22 NOTE — Progress Notes (Signed)
Spoke with daughter-lives in [place]. Has not spoken to her mother in the past week. According to the daughter, the patient lives by herself, was supposed to be renting a house and moving in there.  The daughter did not know if the mother was compliant medications or not. -- Amie Portland, MD [facility] Pager: [phone] If 7pm to 7am, please call on call as listed on AMION.

## 2019-05-22 NOTE — Progress Notes (Signed)
  Echocardiogram 2D Echocardiogram has been performed.  Lauren Warner 05/22/2019, 11:17 AM

## 2019-05-22 NOTE — Procedures (Signed)
ELECTROENCEPHALOGRAM REPORT   Patient: Lauren Warner       Room #: [id number] EEG No. ID: [id number]  Age: 53 y.o.        Sex: female Referring Physician: [name] Report Date:  05/22/2019        Interpreting Physician: [name]  History: Harinder Iskander is an 53 y.o. female found down with seizure like activity.    Medications:  ASA, Vancomycin, Rocephin, Plavix, Neurontin, Keppra, Vancomycin, Cleviprex, Insulin, Diprovan  Conditions of Recording:  This is a 21 channel routine scalp EEG performed with bipolar and monopolar montages arranged in accordance to the international 10/20 system of electrode placement. One channel was dedicated to EKG recording.  The patient is in the intubated and sedated state.  Description:  The background activity is slow and poorly organized.  It consists of a low voltage polymorphic delta activity that is continuous and diffusely distributed.  There is no activation of the background noted with stimulation.   No epileptiform activity is noted.   Hyperventilation and intermittent photic stimulation were not performed.   IMPRESSION: This is an abnormal EEG secondary to general background slowing.  This finding may be seen with a diffuse disturbance that is etiologically nonspecific, but may include a metabolic encephalopathy or medication effect, among other possibilities.  No epileptiform activity was noted.     [name], MD Neurology [phone] 05/22/2019, 1:17 PM

## 2019-05-23 ENCOUNTER — Inpatient Hospital Stay (HOSPITAL_COMMUNITY): Payer: Medicare Other

## 2019-05-23 DIAGNOSIS — J9601 Acute respiratory failure with hypoxia: Principal | ICD-10-CM

## 2019-05-23 LAB — BASIC METABOLIC PANEL
Anion gap: 19 — ABNORMAL HIGH (ref 5–15)
Anion gap: 19 — ABNORMAL HIGH (ref 5–15)
Anion gap: 16 — ABNORMAL HIGH (ref 5–15)
BUN: 72 mg/dL — ABNORMAL HIGH (ref 6–20)
BUN: 74 mg/dL — ABNORMAL HIGH (ref 6–20)
BUN: 73 mg/dL — ABNORMAL HIGH (ref 6–20)
CO2: 21 mmol/L — ABNORMAL LOW (ref 22–32)
CO2: 21 mmol/L — ABNORMAL LOW (ref 22–32)
CO2: 22 mmol/L (ref 22–32)
Calcium: 8.3 mg/dL — ABNORMAL LOW (ref 8.9–10.3)
Calcium: 8.3 mg/dL — ABNORMAL LOW (ref 8.9–10.3)
Calcium: 7.8 mg/dL — ABNORMAL LOW (ref 8.9–10.3)
Chloride: 98 mmol/L (ref 98–111)
Chloride: 97 mmol/L — ABNORMAL LOW (ref 98–111)
Chloride: 98 mmol/L (ref 98–111)
Creatinine, Ser: 9.46 mg/dL — ABNORMAL HIGH (ref 0.44–1.00)
Creatinine, Ser: 9.46 mg/dL — ABNORMAL HIGH (ref 0.44–1.00)
Creatinine, Ser: 9.83 mg/dL — ABNORMAL HIGH (ref 0.44–1.00)
GFR calc Af Amer: 5 mL/min — ABNORMAL LOW (ref >60)
GFR calc Af Amer: 5 mL/min — ABNORMAL LOW (ref >60)
GFR calc Af Amer: 5 mL/min — ABNORMAL LOW (ref >60)
GFR calc non Af Amer: 4 mL/min — ABNORMAL LOW (ref >60)
GFR calc non Af Amer: 4 mL/min — ABNORMAL LOW (ref >60)
GFR calc non Af Amer: 4 mL/min — ABNORMAL LOW (ref >60)
Glucose, Bld: 166 mg/dL — ABNORMAL HIGH (ref 70–99)
Glucose, Bld: 128 mg/dL — ABNORMAL HIGH (ref 70–99)
Glucose, Bld: 146 mg/dL — ABNORMAL HIGH (ref 70–99)
Potassium: 3.8 mmol/L (ref 3.5–5.1)
Potassium: 3.8 mmol/L (ref 3.5–5.1)
Potassium: 3.7 mmol/L (ref 3.5–5.1)
Sodium: 138 mmol/L (ref 135–145)
Sodium: 137 mmol/L (ref 135–145)
Sodium: 136 mmol/L (ref 135–145)

## 2019-05-23 LAB — CBC
HCT: 29.4 % — ABNORMAL LOW (ref 36.0–46.0)
Hemoglobin: 9.2 g/dL — ABNORMAL LOW (ref 12.0–15.0)
MCH: 23.9 pg — ABNORMAL LOW (ref 26.0–34.0)
MCHC: 31.3 g/dL (ref 30.0–36.0)
MCV: 76.4 fL — ABNORMAL LOW (ref 80.0–100.0)
Platelets: 171 10*3/uL (ref 150–400)
RBC: 3.85 MIL/uL — ABNORMAL LOW (ref 3.87–5.11)
RDW: 20.3 % — ABNORMAL HIGH (ref 11.5–15.5)
WBC: 7.8 10*3/uL (ref 4.0–10.5)
nRBC: 0 % (ref 0.0–0.2)

## 2019-05-23 LAB — GLUCOSE, CAPILLARY
Glucose-Capillary: 124 mg/dL — ABNORMAL HIGH (ref 70–99)
Glucose-Capillary: 136 mg/dL — ABNORMAL HIGH (ref 70–99)
Glucose-Capillary: 145 mg/dL — ABNORMAL HIGH (ref 70–99)
Glucose-Capillary: 146 mg/dL — ABNORMAL HIGH (ref 70–99)
Glucose-Capillary: 148 mg/dL — ABNORMAL HIGH (ref 70–99)
Glucose-Capillary: 149 mg/dL — ABNORMAL HIGH (ref 70–99)
Glucose-Capillary: 157 mg/dL — ABNORMAL HIGH (ref 70–99)
Glucose-Capillary: 158 mg/dL — ABNORMAL HIGH (ref 70–99)
Glucose-Capillary: 159 mg/dL — ABNORMAL HIGH (ref 70–99)
Glucose-Capillary: 170 mg/dL — ABNORMAL HIGH (ref 70–99)

## 2019-05-23 LAB — BETA-HYDROXYBUTYRIC ACID: Beta-Hydroxybutyric Acid: 0.25 mmol/L (ref 0.05–0.27)

## 2019-05-23 LAB — MAGNESIUM
Magnesium: 2.2 mg/dL (ref 1.7–2.4)
Magnesium: 2.1 mg/dL (ref 1.7–2.4)

## 2019-05-23 LAB — PHOSPHORUS: Phosphorus: 6.2 mg/dL — ABNORMAL HIGH (ref 2.5–4.6)

## 2019-05-23 MED ORDER — INSULIN ASPART 100 UNIT/ML ~~LOC~~ SOLN
0.0000 [IU] | SUBCUTANEOUS | Status: DC
  Administered 2019-05-23 (×2): 1 [IU] via SUBCUTANEOUS
  Administered 2019-05-24: 17:00:00 2 [IU] via SUBCUTANEOUS
  Administered 2019-05-24 (×3): 1 [IU] via SUBCUTANEOUS
  Administered 2019-05-24 – 2019-05-25 (×6): 2 [IU] via SUBCUTANEOUS
  Administered 2019-05-25: 12:00:00 3 [IU] via SUBCUTANEOUS
  Administered 2019-05-26: 12:00:00 1 [IU] via SUBCUTANEOUS
  Administered 2019-05-26: 2 [IU] via SUBCUTANEOUS
  Administered 2019-05-26: 1 [IU] via SUBCUTANEOUS
  Administered 2019-05-26: 01:00:00 2 [IU] via SUBCUTANEOUS
  Administered 2019-05-26: 1 [IU] via SUBCUTANEOUS
  Administered 2019-05-26: 08:00:00 2 [IU] via SUBCUTANEOUS
  Administered 2019-05-27: 0 [IU] via SUBCUTANEOUS
  Administered 2019-05-27: 1 [IU] via SUBCUTANEOUS
  Administered 2019-05-27 (×2): 2 [IU] via SUBCUTANEOUS
  Administered 2019-05-27: 1 [IU] via SUBCUTANEOUS
  Administered 2019-05-28: 23:00:00 2 [IU] via SUBCUTANEOUS
  Administered 2019-05-28: 21:00:00 3 [IU] via SUBCUTANEOUS
  Administered 2019-05-28 (×3): 2 [IU] via SUBCUTANEOUS
  Administered 2019-05-28: 04:00:00 1 [IU] via SUBCUTANEOUS
  Administered 2019-05-29: 05:00:00 2 [IU] via SUBCUTANEOUS
  Administered 2019-05-29: 17:00:00 3 [IU] via SUBCUTANEOUS
  Administered 2019-05-29: 1 [IU] via SUBCUTANEOUS
  Administered 2019-05-29 – 2019-05-30 (×3): 2 [IU] via SUBCUTANEOUS
  Administered 2019-05-30: 20:00:00 3 [IU] via SUBCUTANEOUS
  Administered 2019-05-30 – 2019-05-31 (×5): 2 [IU] via SUBCUTANEOUS
  Administered 2019-05-31: 4 [IU] via SUBCUTANEOUS

## 2019-05-23 MED ORDER — INSULIN DETEMIR 100 UNIT/ML ~~LOC~~ SOLN: 0.3000 [IU]/kg | SUBCUTANEOUS | Status: DC

## 2019-05-23 MED ORDER — INSULIN DETEMIR 100 UNIT/ML ~~LOC~~ SOLN
10.0000 [IU] | Freq: Every day | SUBCUTANEOUS | Status: DC
  Administered 2019-05-23 – 2019-05-30 (×8): 10 [IU] via SUBCUTANEOUS
  Filled 2019-05-23 (×8): qty 0.1

## 2019-05-23 MED ORDER — SODIUM CHLORIDE 0.9 % IV SOLN: INTRAVENOUS | Status: DC

## 2019-05-23 MED ORDER — VITAL 1.5 CAL PO LIQD
1000.0000 mL | ORAL | Status: DC
  Administered 2019-05-23 – 2019-05-31 (×8): 1000 mL
  Filled 2019-05-23 (×10): qty 1000

## 2019-05-23 MED ORDER — INSULIN DETEMIR 100 UNIT/ML ~~LOC~~ SOLN: 10.0000 [IU] | SUBCUTANEOUS | Status: DC

## 2019-05-23 MED ORDER — PRO-STAT SUGAR FREE PO LIQD
30.0000 mL | Freq: Three times a day (TID) | ORAL | Status: DC
  Administered 2019-05-23 – 2019-05-31 (×25): 30 mL
  Filled 2019-05-23 (×24): qty 30

## 2019-05-23 MED ORDER — INSULIN ASPART 100 UNIT/ML ~~LOC~~ SOLN: 0.0000 [IU] | SUBCUTANEOUS | Status: DC

## 2019-05-23 MED ORDER — INSULIN DETEMIR 100 UNIT/ML ~~LOC~~ SOLN
10.0000 [IU] | Freq: Every day | SUBCUTANEOUS | Status: DC
  Filled 2019-05-23: qty 0.1

## 2019-05-23 NOTE — Progress Notes (Signed)
Initial Nutrition Assessment  DOCUMENTATION CODES:   Not applicable  INTERVENTION:   Initiate Vital 1.5 @ 40 ml/hr via OG tube 30 ml Prostat TID  Provides: 1740 kcal, 109 grams protein, and 733 ml free water.  TF regimen and cleviprex at current rate providing 2124 total kcal/day    NUTRITION DIAGNOSIS:   Increased nutrient needs related to (ESRD and related therapies) as evidenced by estimated needs.  GOAL:   Patient will meet greater than or equal to 90% of their needs  MONITOR:   TF tolerance  REASON FOR ASSESSMENT:   Consult, Ventilator Enteral/tube feeding initiation and management  ASSESSMENT:   Pt with PMH of CHF, ESRD on HD, HTN, HLD, Type 1 DM and PE who had missed HD recently admitted after being found down at home during a well check seizing.  12/20 MRI shows global hypoxic ischemic injury 12/21 transitioned off insulin gtt  Patient is currently intubated on ventilator support without sedation.  MV: 11.6 L/min Temp (24hrs), Avg:99 F (37.2 C), Min:98.4 F (36.9 C), Max:99.7 F (37.6 C)  Cleviprex @ 8 ml/hr provides: 384 kcal  Propofol: off Medications reviewed and include: levemir, SSI Labs reviewed: BUN/Cr 74/9.46    NUTRITION - FOCUSED PHYSICAL EXAM:    Most Recent Value  Orbital Region  No depletion  Upper Arm Region  No depletion  Thoracic and Lumbar Region  No depletion  Buccal Region  No depletion  Temple Region  No depletion  Clavicle Bone Region  No depletion  Clavicle and Acromion Bone Region  No depletion  Scapular Bone Region  No depletion  Dorsal Hand  No depletion  Patellar Region  No depletion  Anterior Thigh Region  No depletion  Posterior Calf Region  No depletion  Edema (RD Assessment)  None  Hair  Reviewed  Eyes  Unable to assess  Mouth  Unable to assess  Skin  Reviewed  Nails  Reviewed       Diet Order:   Diet Order            Diet NPO time specified  Diet effective now              EDUCATION NEEDS:    No education needs have been identified at this time  Skin:  Skin Assessment: Reviewed RN Assessment  Last BM:  12/20  Height:   Ht Readings from Last 1 Encounters:  05/22/19 5\' 7"  (1.702 m)    Weight:   Wt Readings from Last 1 Encounters:  05/22/19 85 kg    Ideal Body Weight:  57.6 kg  BMI:  Adjusted: 31.4  Estimated Nutritional Needs:   Kcal:  1984  Protein:  100-115 grams  Fluid:  > 1.2 L/day  Maylon Peppers RD, LDN, CNSC [phone] Pager [phone] After Hours Pager

## 2019-05-23 NOTE — Progress Notes (Signed)
Spoke with patients daughter from [place] with updates. Answered questions she had. She is trying to decide when to fly out here to be with her mother. She would like a call from an MD with updates as well. Message handed along. Kanai Berrios, Rande Brunt, RN

## 2019-05-23 NOTE — Progress Notes (Signed)
[facility] ROUNDING NOTE   Subjective:   This is a 53 year old lady with end-stage renal disease Tuesday Thursday Saturday dialysis with poor compliance she has a history of congestive heart failure coronary artery disease history of pulmonary embolus diabetes mellitus she was brought to the emergency room after her daughter was unable to reach her she was found seizing and covered in stool.  She was intubated and brought to the emergency room she was loaded with Keppra given Ativan.  She received urgent dialysis for hyperkalemia.  Blood pressure 120/60 pulse 102 temperature 98 O2 sats 100% FiO2 40%  Sodium 137 potassium 3.8 chloride 97 CO2 21 BUN 74 creatinine 9.46 glucose 128 calcium 8.3 magnesium 2.2  WBC 7.8 hemoglobin 9.3 platelets 171  Aspirin 81 mg daily Plavix 75 mg daily, Neurontin 100 mg daily, Insulin sliding scale, insulin detemir 10 units, Protonix 40 mg daily  IV Rocephin 1 g every 24 hours IV Keppra 500 mg every 12 hours IV vancomycin   Objective:  Vital signs in last 24 hours:  Temp:  [98.4 F (36.9 C)-99.7 F (37.6 C)] 98.8 F (37.1 C) (12/21 1300) Pulse Rate:  [91-109] 103 (12/21 1300) Resp:  [9-25] 18 (12/21 1300) BP: (113-176)/(53-99) 145/61 (12/21 1300) SpO2:  [97 %-100 %] 100 % (12/21 1300) FiO2 (%):  [40 %] 40 % (12/21 1115)  Weight change:  Filed Weights   05/19/2019 2200 05/22/19 0210 05/22/19 0550  Weight: 81.2 kg 85 kg 85 kg    Intake/Output: I/O last 3 completed shifts: In: 580.8 [I.V.:180.9; IV Piggyback:399.9] Out: 25 [Urine:25]   Intake/Output this shift:  Total I/O In: 1014.4 [I.V.:814.4; IV Piggyback:200] Out: 44 [Urine:40]  General:chronically ill appearing female in NAD, intubated and sedated Heart:RRR, +7/6 systolic murmur Lungs:grossly clear Abdomen:soft, NTND Extremities:L AKA, R - no edema Dialysis Access: LU AVF +b    Basic Metabolic Panel: Recent Labs  Lab 05/22/19 0840 05/22/19 1200 05/22/19 1427  05/23/19 0104 05/23/19 0623  NA 140 139 139 138 137  K 3.5 3.9 4.0 3.8 3.8  CL 99 98 99 98 97*  CO2 21* 21* 21* 21* 21*  GLUCOSE 117* 141* 142* 166* 128*  BUN 74* 73* 74* 72* 74*  CREATININE 8.46* 8.86* 8.93* 9.46* 9.46*  CALCIUM 8.6* 8.4* 8.2* 8.3* 8.3*  MG  --   --   --   --  2.2    Liver Function Tests: Recent Labs  Lab 05/19/2019 2004  AST 26  ALT 11  ALKPHOS 51  BILITOT 1.1  PROT 6.3*  ALBUMIN 3.3*   No results for input(s): LIPASE, AMYLASE in the last 168 hours. Recent Labs  Lab 05/06/2019 2004  AMMONIA 35    CBC: Recent Labs  Lab 05/20/2019 2004 05/20/2019 2015 05/07/2019 2328 05/22/19 0225 05/23/19 0623  WBC 14.7*  --   --  14.9* 7.8  NEUTROABS 13.1*  --   --   --   --   HGB 9.9* 11.6*  11.2* 10.5* 9.2* 9.2*  HCT 33.5* 34.0*  33.0* 31.0* 29.4* 29.4*  MCV 80.1  --   --  75.6* 76.4*  PLT 234  --   --  215 171    Cardiac Enzymes: Recent Labs  Lab 05/15/2019 2004  CKTOTAL 451*    BNP: Invalid input(s): POCBNP  CBG: Recent Labs  Lab 05/23/19 0453 05/23/19 0646 05/23/19 0824 05/23/19 0936 05/23/19 1137  GLUCAP 145* 124* 148* 146* 157*    Microbiology: Results for orders placed or performed during the  hospital encounter of 05/04/2019  Respiratory Panel by RT PCR (Flu A&B, Covid) - Nasopharyngeal Swab     Status: None   Collection Time: 05/26/2019  8:11 PM   Specimen: Nasopharyngeal Swab  Result Value Ref Range Status   SARS Coronavirus 2 by RT PCR NEGATIVE NEGATIVE Final    Comment: (NOTE) SARS-CoV-2 target nucleic acids are NOT DETECTED. The SARS-CoV-2 RNA is generally detectable in upper respiratoy specimens during the acute phase of infection. The lowest concentration of SARS-CoV-2 viral copies this assay can detect is 131 copies/mL. A negative result does not preclude SARS-Cov-2 infection and should not be used as the sole basis for treatment or other patient management decisions. A negative result may occur with  improper specimen  collection/handling, submission of specimen other than nasopharyngeal swab, presence of viral mutation(s) within the areas targeted by this assay, and inadequate number of viral copies (<131 copies/mL). A negative result must be combined with clinical observations, patient history, and epidemiological information. The expected result is Negative. Fact Sheet for Patients:  PinkCheek.be Fact Sheet for Healthcare Providers:  GravelBags.it This test is not yet ap proved or cleared by the [place] FDA and  has been authorized for detection and/or diagnosis of SARS-CoV-2 by FDA under an Emergency Use Authorization (EUA). This EUA will remain  in effect (meaning this test can be used) for the duration of the COVID-19 declaration under Section 564(b)(1) of the Act, 21 U.S.C. section 360bbb-3(b)(1), unless the authorization is terminated or revoked sooner.    Influenza A by PCR NEGATIVE NEGATIVE Final   Influenza B by PCR NEGATIVE NEGATIVE Final    Comment: (NOTE) The Xpert Xpress SARS-CoV-2/FLU/RSV assay is intended as an aid in  the diagnosis of influenza from Nasopharyngeal swab specimens and  should not be used as a sole basis for treatment. Nasal washings and  aspirates are unacceptable for Xpert Xpress SARS-CoV-2/FLU/RSV  testing. Fact Sheet for Patients: PinkCheek.be Fact Sheet for Healthcare Providers: GravelBags.it This test is not yet approved or cleared by the [place] FDA and  has been authorized for detection and/or diagnosis of SARS-CoV-2 by  FDA under an Emergency Use Authorization (EUA). This EUA will remain  in effect (meaning this test can be used) for the duration of the  Covid-19 declaration under Section 564(b)(1) of the Act, 21  U.S.C. section 360bbb-3(b)(1), unless the authorization is  terminated or revoked. Performed at [facility] [address] [place], [place] [address]   Blood culture (routine x 2)     Status: Abnormal (Preliminary result)   Collection Time: 05/04/2019  9:25 PM   Specimen: BLOOD  Result Value Ref Range Status   Specimen Description BLOOD SITE NOT SPECIFIED  Final   Special Requests   Final    BOTTLES DRAWN AEROBIC AND ANAEROBIC Blood Culture adequate volume   Culture  Setup Time   Final    GRAM POSITIVE COCCI IN BOTH AEROBIC AND ANAEROBIC BOTTLES D. PIERCE, PHARMD AT [place] ON 05/22/19 BY C. JESSUP, MT.    Culture (A)  Final    STAPHYLOCOCCUS SPECIES (COAGULASE NEGATIVE) THE SIGNIFICANCE OF ISOLATING THIS ORGANISM FROM A SINGLE SET OF BLOOD CULTURES WHEN MULTIPLE SETS ARE DRAWN IS UNCERTAIN. PLEASE NOTIFY THE MICROBIOLOGY DEPARTMENT WITHIN ONE WEEK IF SPECIATION AND SENSITIVITIES ARE REQUIRED. Performed at [facility], [place] [address] [place], [place] [address]    Report Status PENDING  Incomplete  Blood culture (routine x 2)     Status: None (  Preliminary result)   Collection Time: 05/22/19 12:26 AM   Specimen: BLOOD RIGHT FOREARM  Result Value Ref Range Status   Specimen Description BLOOD RIGHT FOREARM  Final   Special Requests   Final    BOTTLES DRAWN AEROBIC AND ANAEROBIC Blood Culture adequate volume   Culture   Final    NO GROWTH 1 DAY Performed at [facility], [place] [address], [place], [place] [address]    Report Status PENDING  Incomplete  MRSA PCR Screening     Status: None   Collection Time: 05/22/19  1:55 AM   Specimen: Nasal Mucosa; Nasopharyngeal  Result Value Ref Range Status   MRSA by PCR NEGATIVE NEGATIVE Final    Comment:        The GeneXpert MRSA Assay (FDA approved for NASAL specimens only), is one component of a comprehensive MRSA colonization surveillance program. It is not intended to diagnose MRSA infection nor to guide or monitor treatment for MRSA infections. Performed at [facility], [place] [address] [place], [place] [address]      Coagulation Studies: Recent Labs    05/18/2019 08-18-02  LABPROT 15.4*  INR 1.2    Urinalysis: Recent Labs    05/08/2019 08/19/23  COLORURINE YELLOW  LABSPEC 1.013  PHURINE 6.0  GLUCOSEU >=500*  HGBUR NEGATIVE  BILIRUBINUR NEGATIVE  KETONESUR 20*  PROTEINUR >=300*  NITRITE NEGATIVE  LEUKOCYTESUR NEGATIVE      Imaging: EEG  Result Date: 05/22/2019 [name], MD     05/22/2019  1:23 PM ELECTROENCEPHALOGRAM REPORT Patient: Rhona Fusilier       Room #: 8G95A EEG No. ID: [id number] Age: 53 y.o.        Sex: female Referring Physician: [name] Report Date:  05/22/2019       Interpreting Physician: [name] History: Taneesha Edgin is an 53 y.o. female found down with seizure like activity.  Medications: ASA, Vancomycin, Rocephin, Plavix, Neurontin, Keppra, Vancomycin, Cleviprex, Insulin, Diprovan Conditions of Recording:  This is a 21 channel routine scalp EEG performed with bipolar and monopolar montages arranged in accordance to the international 10/20 system of electrode placement. One channel was dedicated to EKG recording. The patient is in the intubated and sedated state. Description:  The background activity is slow and poorly organized.  It consists of a low voltage polymorphic delta activity that is continuous and diffusely distributed.  There is no activation of the background noted with stimulation.  No epileptiform activity is noted.  Hyperventilation and intermittent photic stimulation were not performed. IMPRESSION: This is an abnormal EEG secondary to general background slowing.  This finding may be seen with a diffuse disturbance that is etiologically nonspecific, but may include a metabolic encephalopathy or medication effect, among other possibilities.  No epileptiform activity was noted.  [name], MD Neurology [phone] 05/22/2019, 1:17 PM   CT HEAD WO CONTRAST  Result Date: 05/22/2019 CLINICAL DATA:  Admitted with seizure and renal failure. Abnormal neural  exam-appended EXAM: CT HEAD WITHOUT CONTRAST TECHNIQUE: Contiguous axial images were obtained from the base of the skull through the vertex without intravenous contrast. COMPARISON:  CT earlier today FINDINGS: Brain: Worsening scan with cytotoxic edema appearance along the bilateral cerebral convexities, greater on the right. There is more well-defined low-density in the bilateral caudate head which is more likely acute than from chronic small vessel ischemia given the evolution. Diffuse effacement of sulci compared to prior. The lateral ventricles are also narrower than expected. No herniation or midline shift. This  is likely infarcts with pattern suggesting global insult. The patient is hypertensive but rapid development and anterior circulation extent makes me favor posterior reversible encephalopathy syndrome less. Negative for acute hemorrhage. Vascular: Atherosclerotic calcification Skull: Generalized scalp swelling. Question long-standing dependent positioning given the left-sided asymmetry and presenting symptoms. Sinuses/Orbits: Nasopharyngeal fluid in the setting of intubation. Bilateral cataract resection. Other: These results were called by telephone at the time of interpretation on 05/22/2019 at 11:11 am to provider [facility] , who verbally acknowledged these results. IMPRESSION: Rapidly worsening scan with new cytotoxic edema appearance along the right more than left cerebral convexity and at the anterior caudate nuclei. Suspect global anoxic injury with infarcts. There is related sulcal and ventricular effacement without herniation or shift. Electronically Signed   By: [name]   On: 05/22/2019 11:14   CT Head Wo Contrast  Result Date: 05/22/2019 CLINICAL DATA:  Cerebral hemorrhage suspected Found on floor. Seizure-like activity. EXAM: CT HEAD WITHOUT CONTRAST TECHNIQUE: Contiguous axial images were obtained from the base of the skull through the vertex without intravenous  contrast. COMPARISON:  Head CT 04/12/2019 FINDINGS: Brain: No intracranial hemorrhage, mass effect, or midline shift. No hydrocephalus. The basilar cisterns are patent. No evidence of territorial infarct or acute ischemia. Gray-white differentiation is preserved. No extra-axial or intracranial fluid collection. Vascular: Atherosclerosis of skullbase vasculature without hyperdense vessel or abnormal calcification. Skull: No fracture or focal lesion. Sinuses/Orbits: Retained secretions likely related to intubation. Mastoid air cells are clear. Other: Low densities scalp subgaleal collections involving the right occiput, right parietal region, left temporal frontal region. These are new from prior exam. IMPRESSION: 1. No acute intracranial abnormality. No skull fracture. 2. Low-density scalp subgaleal collections, significance uncertain. These are new from 04/12/2019 CT, may represent liquefying scalp hematomas. No surrounding inflammatory change to suggest infection. Electronically Signed   By: Keith Rake M.D.   On: 05/22/2019 01:42   MR BRAIN WO CONTRAST  Result Date: 05/22/2019 CLINICAL DATA:  Seizure. End-stage renal disease on dialysis. EXAM: MRI HEAD WITHOUT CONTRAST TECHNIQUE: Multiplanar, multiecho pulse sequences of the brain and surrounding structures were obtained without intravenous contrast. COMPARISON:  Head CT 05/22/2019 and MRI 10/19/2017 FINDINGS: Brain: There is extensive T2 and diffusion weighted signal abnormality throughout the right cerebral hemisphere with associated reduced ADC involving cortex and white matter with diffuse gyral swelling. All lobes are involved including the entirety of the right parietal lobe, and there is involvement of multiple vascular territories. Smaller regions of cortically based restricted diffusion are present in the left cerebral hemisphere involving the frontal and parietal lobes as well as insula. There is also subcortical vasogenic type edema in the  left parietal lobe. There is T2 hyperintensity and mild diffusion weighted signal abnormality diffusely in the basal ganglia which reflects mixed restricted and facilitated diffusion. The thalami do not appear significantly affected, although there is patchy T2 hyperintensity in the cerebellum and brainstem without restricted diffusion. A small amount of petechial hemorrhage is present in the left parietal lobe, and there are also multiple chronic microhemorrhages in the frontal and parietal lobes bilaterally which have increased in number from the prior MRI. The lateral ventricles are partially effaced due to basal ganglia and right temporal lobe edema. There is no evidence of herniation. No extra-axial fluid collection is present. Vascular: Major intracranial vascular flow voids are preserved. Skull and upper cervical spine: No suspicious marrow lesion. Sinuses/Orbits: Bilateral cataract extraction. Mild mucosal thickening in the paranasal sinuses. Small left mastoid effusion. Other: Scalp  swelling/fluid. IMPRESSION: Extensive signal abnormality throughout the right cerebral hemisphere and bilateral basal ganglia with less pronounced left cerebral and posterior fossa involvement, favored to reflect global hypoxic-ischemic injury with infarcts. The degree of restricted diffusion and asymmetry are atypical of posterior reversible encephalopathy syndrome. Electronically Signed   By: Logan Bores M.D.   On: 05/22/2019 17:15   DG Chest Port 1 View  Result Date: 05/23/2019 CLINICAL DATA:  Intubation.  Acute respiratory failure. EXAM: PORTABLE CHEST 1 VIEW COMPARISON:  05/22/2019.  05/13/2019. FINDINGS: Endotracheal tube, NG tube, left IJ line stable position. Cardiomegaly. Slight increase interstitial markings noted on today's exam. CHF could present in this fashion. Pneumonitis cannot be excluded. No pleural effusion or pneumothorax. IMPRESSION: 1.  Lines and tubes stable position. 2. Cardiomegaly. Slight increase  in interstitial markings noted on today's exam. CHF could present in this fashion. Pneumonitis cannot be excluded. Electronically Signed   By: Marcello Moores  Register   On: 05/23/2019 05:54   DG CHEST PORT 1 VIEW  Result Date: 05/22/2019 CLINICAL DATA:  Central line placement. EXAM: PORTABLE CHEST 1 VIEW COMPARISON:  Radiograph yesterday FINDINGS: Left internal jugular central venous catheter tip projects over the mid SVC. No pneumothorax. Improved aeration from exam yesterday with decreasing interstitial opacities. Endotracheal tube and enteric tubes in place. Improved cardiomegaly. No visualized pleural effusion. IMPRESSION: 1. Left internal jugular central venous catheter tip projects over the mid SVC. No pneumothorax. 2. Improved aeration from exam yesterday with decreasing interstitial opacities. 3. Improved cardiomegaly. Electronically Signed   By: Keith Rake M.D.   On: 05/22/2019 05:26   DG Chest Portable 1 View  Result Date: 05/15/2019 CLINICAL DATA:  Post intubation EXAM: PORTABLE CHEST 1 VIEW COMPARISON:  May 21, 2019 FINDINGS: The heart size and mediastinal contours are unchanged from prior exam. ET tube is 1.6 cm above the level of the carina. NG tube is seen coursing below the diaphragm. There is prominent interstitial markings seen throughout both lungs. The 9 mm pulmonary nodular opacity in the right upper lung is not as well visualized on this examination. There is elevation of the right hemidiaphragm. No acute osseous abnormality. IMPRESSION: ET tube and NG tube in satisfactory position. Electronically Signed   By: Prudencio Pair M.D.   On: 05/18/2019 23:22   DG Chest Portable 1 View  Result Date: 05/07/2019 CLINICAL DATA:  Altered mental status EXAM: PORTABLE CHEST 1 VIEW COMPARISON:  April 12, 2019 FINDINGS: The heart size is significantly enlarged. Again noted are prominent interstitial lung markings. There is no pneumothorax. May be small bilateral pleural effusions. There is  a new focal density measuring approximately 9 mm in the right upper lung zone. There is elevation of the right hemidiaphragm. IMPRESSION: 1. Cardiomegaly with findings concerning for pulmonary edema. 2. New 9 mm density projecting over the right upper lung zone. This could represent a pulmonary nodule or focal infiltrate. A 4-6 week follow-up two-view chest x-ray is recommended to confirm resolution of this finding. Electronically Signed   By: Constance Holster M.D.   On: 05/04/2019 21:34   ECHOCARDIOGRAM COMPLETE  Result Date: 05/22/2019   ECHOCARDIOGRAM REPORT   Patient Name:   DOROTHEE NAPIERKOWSKI Date of Exam: 05/22/2019 Medical Rec #:  [id number]     Height:       67.0 in Accession #:    [id number]    Weight:       187.4 lb Date of Birth:  [birth year]    BSA:  1.97 m Patient Age:    46 years      BP:           118/70 mmHg Patient Gender: F             HR:           93 bpm. Exam Location:  Inpatient Procedure: 2D Echo, Cardiac Doppler and Color Doppler Indications:    J00.93 Acute diastolic (congestive) heart failure  History:        Patient has prior history of Echocardiogram examinations, most                 recent 10/21/2017. CHF, CAD, Signs/Symptoms:Dyspnea and Syncope;                 Risk Factors:Diabetes and Hypertension. Hypoxia. ESRD.  Sonographer:    Roseanna Rainbow RDCS Referring Phys: [id number] [place]  1. Left ventricular ejection fraction, by visual estimation, is 60 to 65%. The left ventricle has normal function. There is severely increased left ventricular hypertrophy.  2. Left ventricular diastolic parameters are consistent with Grade II diastolic dysfunction (pseudonormalization).  3. The left ventricle has no regional wall motion abnormalities.  4. Global right ventricle has normal systolic function.The right ventricular size is normal. No increase in right ventricular wall thickness.  5. Left atrial size was normal.  6. Right atrial size was normal.  7. Moderate pleural  effusion in the left lateral region.  8. The mitral valve is normal in structure. Trivial mitral valve regurgitation.  9. The tricuspid valve is normal in structure. Tricuspid valve regurgitation is mild. 10. The aortic valve is tricuspid. Aortic valve regurgitation is not visualized. Mild aortic valve sclerosis without stenosis. 11. The pulmonic valve was grossly normal. Pulmonic valve regurgitation is not visualized. 12. Mildly elevated pulmonary artery systolic pressure. 13. The tricuspid regurgitant velocity is 2.69 m/s, and with an assumed right atrial pressure of 8 mmHg, the estimated right ventricular systolic pressure is mildly elevated at 36.9 mmHg. FINDINGS  Left Ventricle: Left ventricular ejection fraction, by visual estimation, is 60 to 65%. The left ventricle has normal function. The left ventricle has no regional wall motion abnormalities. There is severely increased left ventricular hypertrophy. Concentric left ventricular hypertrophy. Left ventricular diastolic parameters are consistent with Grade II diastolic dysfunction (pseudonormalization). Right Ventricle: The right ventricular size is normal. No increase in right ventricular wall thickness. Global RV systolic function is has normal systolic function. The tricuspid regurgitant velocity is 2.69 m/s, and with an assumed right atrial pressure  of 8 mmHg, the estimated right ventricular systolic pressure is mildly elevated at 36.9 mmHg. Left Atrium: Left atrial size was normal in size. Right Atrium: Right atrial size was normal in size Pericardium: There is no evidence of pericardial effusion. There is a moderate pleural effusion in the left lateral region. Mitral Valve: The mitral valve is normal in structure. Trivial mitral valve regurgitation. Tricuspid Valve: The tricuspid valve is normal in structure. Tricuspid valve regurgitation is mild. Aortic Valve: The aortic valve is tricuspid. Aortic valve regurgitation is not visualized. Mild aortic  valve sclerosis is present, with no evidence of aortic valve stenosis. Pulmonic Valve: The pulmonic valve was grossly normal. Pulmonic valve regurgitation is not visualized. Pulmonic regurgitation is not visualized. Aorta: The aortic root, ascending aorta and aortic arch are all structurally normal, with no evidence of dilitation or obstruction. Venous: IVC assessment for right atrial pressure unable to be performed due to mechanical ventilation. IAS/Shunts: No  atrial level shunt detected by color flow Doppler.  LEFT VENTRICLE PLAX 2D LVIDd:         3.40 cm       Diastology LVIDs:         2.20 cm       LV e' lateral:   6.20 cm/s LV PW:         1.90 cm       LV E/e' lateral: 13.8 LV IVS:        1.80 cm       LV e' medial:    5.77 cm/s LVOT diam:     1.90 cm       LV E/e' medial:  14.9 LV SV:         31 ml LV SV Index:   15.40 LVOT Area:     2.84 cm  LV Volumes (MOD) LV area d, A2C:    24.40 cm LV area d, A4C:    25.00 cm LV area s, A2C:    11.40 cm LV area s, A4C:    14.43 cm LV major d, A2C:   6.98 cm LV major d, A4C:   6.63 cm LV major s, A2C:   5.29 cm LV major s, A4C:   5.55 cm LV vol d, MOD A2C: 70.1 ml LV vol d, MOD A4C: 77.9 ml LV vol s, MOD A2C: 21.2 ml LV vol s, MOD A4C: 33.0 ml LV SV MOD A2C:     48.9 ml LV SV MOD A4C:     77.9 ml LV SV MOD BP:      48.6 ml RIGHT VENTRICLE             IVC RV S prime:     12.60 cm/s  IVC diam: 2.30 cm TAPSE (M-mode): 2.3 cm LEFT ATRIUM             Index       RIGHT ATRIUM           Index LA diam:        2.20 cm 1.12 cm/m  RA Area:     14.20 cm LA Vol (A2C):   28.8 ml 14.64 ml/m RA Volume:   35.00 ml  17.79 ml/m LA Vol (A4C):   38.1 ml 19.37 ml/m LA Biplane Vol: 35.7 ml 18.15 ml/m  AORTIC VALVE LVOT Vmax:   161.00 cm/s LVOT Vmean:  108.000 cm/s LVOT VTI:    0.296 m  AORTA Ao Root diam: 3.00 cm Ao Asc diam:  3.20 cm MITRAL VALVE                        TRICUSPID VALVE MV Area (PHT): 4.21 cm             TR Peak grad:   28.9 mmHg MV PHT:        52.20 msec           TR  Vmax:        269.00 cm/s MV Decel Time: 180 msec MV E velocity: 85.70 cm/s 103 cm/s  SHUNTS MV A velocity: 76.50 cm/s 70.3 cm/s Systemic VTI:  0.30 m MV E/A ratio:  1.12       1.5       Systemic Diam: 1.90 cm  Buford Dresser MD Electronically signed by Buford Dresser MD Signature Date/Time: 05/22/2019/4:16:32 PM    Final      Medications:   . sodium chloride 50 mL/hr at 05/23/19  1300  . cefTRIAXone (ROCEPHIN)  IV 1 g (05/23/19 1324)  . clevidipine 4 mg/hr (05/23/19 1300)  . feeding supplement (VITAL 1.5 CAL) 1,000 mL (05/23/19 1319)  . levETIRAcetam Stopped (05/23/19 1242)  . sodium chloride     . aspirin  81 mg Per Tube Daily  . chlorhexidine gluconate (MEDLINE KIT)  15 mL Mouth Rinse BID  . Chlorhexidine Gluconate Cloth  6 each Topical Q0600  . clopidogrel  75 mg Per Tube Daily  . feeding supplement (PRO-STAT SUGAR FREE 64)  30 mL Per Tube TID  . gabapentin  100 mg Per Tube Daily  . heparin  5,000 Units Subcutaneous Q8H  . insulin aspart  0-6 Units Subcutaneous Q4H  . insulin detemir  10 Units Subcutaneous Daily  . latanoprost  1 drop Both Eyes QHS  . mouth rinse  15 mL Mouth Rinse 10 times per day  . pantoprazole sodium  40 mg Per Tube Daily  . sodium chloride flush  10-40 mL Intracatheter Q12H  . vancomycin variable dose per unstable renal function (pharmacist dosing)   Does not apply See admin instructions   albuterol, dextrose, fentaNYL (SUBLIMAZE) injection, labetalol, sodium chloride flush  Assessment/ Plan:  1. AMS/Seizure/metabolic encephalopathy - EEG shows no epileptiform activity noted, general background slowing noted which could be from multiple etiologies.  CT suggesting PRES vs anoxic injury vs watershed infarcts.  MRI examination of the brain 12/20 Extensive signal abnormality throughout the right cerebral hemisphere and bilateral basal ganglia with less pronounced left cerebral and posterior fossa involvement, favored to reflect global hypoxic-ischemic  injury with infarcts.  Appreciate assistance from Dr. Leonel Ramsay will continue Keppra. 2. Hyperkalemia 2/2 non compliance with HD - improved post HD. K  3. Acute Resp Failure w/Hypoxia - intubated on mechanical ventilation 4. ESRD - on HD TTS. Non compliant.  Urgent HD last night due to [place].  Next HD on 12/22. 5. Anemia of CKD- Hgb 9.2. ESA recently dosed, not due until 12/26 6. Secondary hyperparathyroidism - Ca at goal. Hold calcitriol due to being sedated/intubated. 7. HTN/volume - BP now improved.  Pulmonary edema improved on repeat CXR.    Will continue to challenge dry weight 8.  Sepsis with gram-positive blood cultures continue antibiotics at this time    LOS: [place] @TODAY @2 :07 PM

## 2019-05-23 NOTE — Progress Notes (Addendum)
Neurology Progress Note   S:// No new neuro events overnight.  Patient is intubated on no sedation, not following commands.  Patient is on insulin drip and cleviprex.  RN present at bedside and reports no signs of seizure activity   O:// Current vital signs: BP 140/66   Pulse 93   Temp 98.4 F (36.9 C)   Resp (!) 24   Ht _0  (1.702 m)   Wt 85 kg   SpO2 100%   BMI 29.35 kg/m  Vital signs in last 24 hours: Temp:  [98.1 F (36.7 C)-99.7 F (37.6 C)] 98.4 F (36.9 C) (12/21 0800) Pulse Rate:  [91-111] 93 (12/21 0800) Resp:  [6-24] 24 (12/21 0800) BP: (110-208)/(54-99) 140/66 (12/21 0800) SpO2:  [97 %-100 %] 100 % (12/21 0800) FiO2 (%):  [40 %] 40 % (12/21 0347)   GENERAL:intubated, on no sedation, slight grimace to painful stimuli, appears comfortable, NAD noted HENT: - Normocephalic and atraumatic, dry mm, ET tube in place Eyes: normal conjunctiva  LUNGS - Clear to auscultation bilaterally with no wheezes CV - S1S2 RRR, no m/r/g, equal pulses bilaterally. ABDOMEN - Soft, nontender, nondistended with normoactive BS Ext: Left BKA, Left arm AV fistula +thrill/bruti, warm, well perfused, intact peripheral pulses, no  Edema Psych: obtunded  NEURO:  Mental Status: nonverbal, slight grimace and eye opening to sternal rub, does not follow commands Language: nonverbal Cranial Nerves: PRRL Right 60m/sluggish and Left 357msluggish. negative dolls, visual fields no blink to threat, weak corneal bilaterally unable to test facial asymmetry,unable to test facial sensation intact, hearing intact, weak cough/gag, unable to test sternocleidomastoid and trapezius muscle strength. No evidence of tongue atrophy or fibrillations Motor: withdraws in all 4 extremities to noxious stimuli Tone: is normal and bulk is normal Sensation- responds to noxious stimuli Coordination: FTN unable to test Gait- deferred   Medications  Current Facility-Administered Medications:  .  albuterol (PROVENTIL)  (2.5 MG/3ML) 0.083% nebulizer solution 2.5 mg, 2.5 mg, Inhalation, Q6H PRN, Gleason, LaOtilio CarpenPA-C .  aspirin chewable tablet 81 mg, 81 mg, Per Tube, Daily, AlKara Mead, MD .  cefTRIAXone (ROCEPHIN) 1 g in sodium chloride 0.9 % 100 mL IVPB, 1 g, Intravenous, Q24H, AlRigoberto NoelMD, Stopped at 05/22/19 1800 .  chlorhexidine gluconate (MEDLINE KIT) (PERIDEX) 0.12 % solution 15 mL, 15 mL, Mouth Rinse, BID, LaFrancie MassingMargo T, MD, 15 mL at 05/22/19 2007 .  Chlorhexidine Gluconate Cloth 2 % PADS 6 each, 6 each, Topical, Q0600, SoAnders SimmondsMD, 6 each at 05/22/19 2121 .  clevidipine (CLEVIPREX) infusion 0.5 mg/mL, 0-21 mg/hr, Intravenous, Continuous, Aroor, SuKarena Addison, MD, Last Rate: 4 mL/hr at 05/23/19 0800, 2 mg/hr at 05/23/19 0800 .  clopidogrel (PLAVIX) tablet 75 mg, 75 mg, Per Tube, Daily, AlKara Mead, MD .  dextrose 10 % infusion, , Intravenous, Continuous, AlRigoberto NoelMD, Last Rate: 40 mL/hr at 05/23/19 0800, Rate Verify at 05/23/19 0800 .  dextrose 50 % solution 0-50 mL, 0-50 mL, Intravenous, PRN, Gleason, LaOtilio CarpenPA-C .  fentaNYL (SUBLIMAZE) injection 50 mcg, 50 mcg, Intravenous, Q15 min PRN, Gleason, LaOtilio CarpenPA-C .  fentaNYL (SUBLIMAZE) injection 50-200 mcg, 50-200 mcg, Intravenous, Q30 min PRN, Gleason, LaOtilio CarpenPA-C .  gabapentin (NEURONTIN) 250 MG/5ML solution 100 mg, 100 mg, Per Tube, Daily, Lannan, Margo T, MD, 100 mg at 05/22/19 1536 .  heparin injection 5,000 Units, 5,000 Units, Subcutaneous, Q8H, Gleason, LaOtilio CarpenPA-C, 5,000 Units at 05/22/19 2124 .  insulin regular, human (  MYXREDLIN) 100 units/ 100 mL infusion, , Intravenous, Continuous, Gleason, Otilio Carpen, PA-C, Stopped at 05/23/19 [phone] .  labetalol (NORMODYNE) injection 10-20 mg, 10-20 mg, Intravenous, Q2H PRN, Aroor, Karena Addison R, MD .  latanoprost (XALATAN) 0.005 % ophthalmic solution 1 drop, 1 drop, Both Eyes, QHS, Gleason, Otilio Carpen, PA-C, 1 drop at 05/22/19 2120 .  levETIRAcetam (KEPPRA) IVPB 500 mg/100 mL premix, 500  mg, Intravenous, Q12H, Aroor, Lanice Schwab, MD, Stopped at 05/22/19 2304 .  MEDLINE mouth rinse, 15 mL, Mouth Rinse, 10 times per day, Daryll Brod T, MD, 15 mL at 05/23/19 0412 .  pantoprazole sodium (PROTONIX) 40 mg/20 mL oral suspension 40 mg, 40 mg, Per Tube, Daily, Daryll Brod T, MD, 40 mg at 05/22/19 1716 .  propofol (DIPRIVAN) 1000 MG/100ML infusion, 5-80 mcg/kg/min, Intravenous, Continuous, Gleason, Otilio Carpen, PA-C, Stopped at 05/22/19 1001 .  sodium chloride 0.9 % bolus 250 mL, 250 mL, Intravenous, Once, Gleason, Otilio Carpen, PA-C .  sodium chloride flush (NS) 0.9 % injection 10-40 mL, 10-40 mL, Intracatheter, Q12H, Kara Mead V, MD .  sodium chloride flush (NS) 0.9 % injection 10-40 mL, 10-40 mL, Intracatheter, PRN, Rigoberto Noel, MD .  vancomycin variable dose per unstable renal function (pharmacist dosing), , Does not apply, See admin instructions, Gleason, Otilio Carpen, PA-C Labs CBC    Component Value Date/Time   WBC 7.8 05/23/2019 0623   RBC 3.85 (L) 05/23/2019 0623   HGB 9.2 (L) 05/23/2019 0623   HCT 29.4 (L) 05/23/2019 0623   PLT 171 05/23/2019 0623   MCV 76.4 (L) 05/23/2019 0623   MCH 23.9 (L) 05/23/2019 0623   MCHC 31.3 05/23/2019 0623   RDW 20.3 (H) 05/23/2019 0623   LYMPHSABS 1.0 05/27/2019 2004   MONOABS 0.4 05/18/2019 2004   EOSABS 0.0 05/24/2019 2004   BASOSABS 0.0 05/04/2019 2004    CMP     Component Value Date/Time   NA 137 05/23/2019 0623   K 3.8 05/23/2019 0623   CL 97 (L) 05/23/2019 0623   CO2 21 (L) 05/23/2019 0623   GLUCOSE 128 (H) 05/23/2019 0623   BUN 74 (H) 05/23/2019 0623   CREATININE 9.46 (H) 05/23/2019 0623   CALCIUM 8.3 (L) 05/23/2019 0623   PROT 6.3 (L) 05/06/2019 2004   ALBUMIN 3.3 (L) 05/17/2019 2004   AST 26 05/15/2019 2004   ALT 11 05/20/2019 2004   ALKPHOS 51 05/09/2019 2004   BILITOT 1.1 05/07/2019 2004   GFRNONAA 4 (L) 05/23/2019 0623   GFRAA 5 (L) 05/23/2019 0623    glycosylated hemoglobin  Lipid Panel  No results found for:  CHOL, TRIG, HDL, CHOLHDL, VLDL, LDLCALC, LDLDIRECT   Imaging I have reviewed images in epic and the results pertinent to this consultation are:  CT-scan of the brain 12/20 worsening scan with new cytotoxic edema appearance along the right more than left cerebral convexity and at the anterior caudate nuclei. Suspect global anoxic injury with infarcts. There is related sulcal and ventricular effacement without herniation or shift  MRI examination of the brain 12/20 Extensive signal abnormality throughout the right cerebral hemisphere and bilateral basal ganglia with less pronounced left cerebral and posterior fossa involvement, favored to reflect global hypoxic-ischemic injury with infarcts  EEG 12/20 no epileptiform activity, generalized slowing   Assessment:   Ms. Lauren Warner is a 53 yo female with past medical history of CHF, ESRD on HD, HTN, HLD, DM 2 and history of PE found down by police after family unable to get in touch with  her. Patient was noted to have seizure in route, was given versed and intubated for airway protection.  Patient is on no sedation and does not follow commands Had emergent HD yesterday  Impression: Acute encephalopathy 2/2 metabolic derangement, uremia, DKA Seizures   Recommendations: Continue with Keppra Continue to treat metabolic abnormalities Frequent neuro checks Q2 hours Seizure precautions Continue to keep BP <160   Beulah Gandy, DNP  I have seen the patient and reviewed the above note.  Her MRI changes could represent hypoxic brain injury given that she was hypoxic on arrival versus severe posterior reversible encephalopathy syndrome (pres).  Clearly, her expected outcome could be  better from PRES than hypoxic brain injury and therefore I think it is reasonable to give her some time to see if her clinical exam improves.   I called and discussed with her daughter that it is possible that she has had a significant brain injury. I do not think that  her prognosis is definite yet, and we will need to see how her clinical exam evolves over time.   Roland Rack, MD [facility] [phone]  If 7pm- 7am, please page neurology on call as listed in [place].

## 2019-05-23 NOTE — Progress Notes (Addendum)
NAME:  Lauren Warner, MRN:  [id number], DOB:  1965/07/02, LOS: 1 ADMISSION DATE:  05/03/2019, CONSULTATION DATE:  05/23/19 REFERRING MD:  , CHIEF COMPLAINT:  Seizures, respiratory failure   Brief History   53 y.o. F with PMH of ESRD on HD, CHF, CAD, PE, DM BIBEMS after being found seizing with hypothermia, right-sided lung infiltrate and DKA. She had missed dialysis had had a K of 7.7  Poor mental status- MRI with bilateral signal difects concerning for hypoxic ischemic changes vs PRES  Past Medical History   has a past medical history of Anemia, Anxiety, Asthma, CHF (congestive heart failure) ([place]), Coronary artery disease, Daily headache, Depression, ESRD (end stage renal disease) on dialysis ([place]), High cholesterol, History of blood transfusion (10/19/2017), Hyperkalemia (10/2017), Hypertension, On home oxygen therapy, Pneumonia, Pulmonary embolism ([place]) (2012), and Type 1 diabetes mellitus ([place]).   Significant Hospital Events   12/20 admit to ICU 12/21 transitioning off insulin gtt   Consults:  Nephrology Neurology  Procedures:  12/20 ETT >> [facility] 12/20 >>  Significant Diagnostic Tests:  12/20 initial head CT >> neg 12/20 CT Head>> rapidly worsening scan with new cytotoxic edema along bilateral cerebral convexities with infarcts 12/20 ECHO> -ECHO LVEF 60-65; grade II diastolic dysfunction. Mildly elevated RVSP 36.9  12/20 MRI brain> Signal abnormality in R cerebral hemisphere and bilateral basal ganglia. (Read as favored to reflect global hypoxic-ischemic injury with infarcts as the seen asymmetry and degree of restricted diffusion are atypical for PRES)  Chest x-ray 12/20  improved bilateral interstitial opacities Micro Data:  12/20 BCx2> GPC 12/20 Sars-Cov-2>>negative  Antimicrobials:  Vancomycin 12/19- Cefepime 12/19-  Interim history/subjective:  Remains intubated and off sedation with poor neuro exam.   Objective   Blood pressure (!) 167/65, pulse (!) 106,  temperature 98.8 F (37.1 C), resp. rate (!) 21, height 5\' 7"  (1.702 m), weight 85 kg, SpO2 100 %.    Vent Mode: PRVC FiO2 (%):  [40 %] 40 % Set Rate:  [24 bmp] 24 bmp Vt Set:  [480 mL] 480 mL PEEP:  [5 cmH20] 5 cmH20 Plateau Pressure:  [18 cmH20-21 cmH20] 20 cmH20   Intake/Output Summary (Last 24 hours) at 05/23/2019 0908 Last data filed at 05/23/2019 0900 Gross per 24 hour  Intake 1226.71 ml  Output 25 ml  Net 1201.71 ml   Filed Weights   05/08/2019 2200 05/22/19 0210 05/22/19 0550  Weight: 81.2 kg 85 kg 85 kg    General:Chronically and critically ill appearing adult F, intubated and comatose NAD HEENT: NCAT. ETT Secure. Pink mmm. Trachea midline.  Neuro: Comatose on no sedation. Deep painful stimulus produces flexion posturing.  CV: RRR s1s2 no rgm Cap refill < 3 seconds BUE  PULM:  Coarse breath sounds bilaterally. Symmetrical chest expansion. No accessory muscle use  GI: Soft, round, ndnt + bowel sounds  Extremities: L AKA. RLE without edema or joint abnormality. BUE symmetrical bulk and tone  Skin: c/d/w/i without rashes    Resolved Hospital Problem list   Hyperkalemia   Assessment & Plan:   Acute encephalopathy due to seizures, abnormal MRI with bilateral signal abnormalities favored to represent hypoxic-ischemic injury with infarcts  -had similar seizure last admission, thought to be r/t metabolic derangement.  -unknown down-time, daughter lives out of state and had not spoken to her in almost a week P: -appreciate Neurology recommendations, continue Keppra, seizure precautions  -SBP goal < 160 -s2hr neuro exam   Acute respiratory failure requiring intubation -intubated for airway protection P -Continue  full vent support  -VAP bundle -Off sedation  -Bronchial hygiene and RT/bronchodilator protocol -AM CXR  -mental status will be ongoing barrier toward extubation, continue to attempt weaning support as able   ESRD  -likely missed dialysis, K 7.7   P: -appreciate nephrology assistance. RRT per nephrology   DKA secondary to Type 1 DM -pH 7.26, received calcium, insulin and 1 amp bicarb in the ED -suspect patient is not using insulin at home P: -Endotool for transition off of gtt now that b HB is normalized   Possible Sepsis -?CAP -poorly visualized 51mm opacity vs nodule in the RUL More likely infiltrates related to volume but cannot rule out aspiration, also note hypothermia on admit -Covid and flu negative  GPC in blood P: -given leukocytosis will treat empirically  -Changed from cefepime to ceftriaxone to avoid neurotoxicity -continue vanc for GPC in blood for now   Hypertensive urgency History of CAD, CHF, HTN -Trop minimally elevated and flat -ECHO LVEF 60-65; grade II diastolic dysfunction. Mildly elevated RVSP 36.9  P: -check echocardiogram -continue plavix and Asa -Continue cleviprex   Inadequate PO intake P -EN per RDN   Best practice:  Diet: EN per RDN  Pain/Anxiety/Delirium protocol (if indicated):  Fentanyl prn VAP protocol (if indicated): yes DVT prophylaxis: heparin, SCD GI prophylaxis: PPI Glucose control: transitioning off insulin gtt to SSI + basal  Mobility: bed rest Code Status: full Family Communication: pending 12/21 Disposition: ICU  CRITICAL CARE Performed by: Cristal Generous   Total critical care time: 40 minutes  Critical care time was exclusive of separately billable procedures and treating other patients. Critical care was necessary to treat or prevent imminent or life-threatening deterioration.  Critical care was time spent personally by me on the following activities: development of treatment plan with patient and/or surrogate as well as nursing, discussions with consultants, evaluation of patient's response to treatment, examination of patient, obtaining history from patient or surrogate, ordering and performing treatments and interventions, ordering and review of laboratory  studies, ordering and review of radiographic studies, pulse oximetry and re-evaluation of patient's condition.  Eliseo Gum MSN, AGACNP-BC [place] [id number] If no answer, [id number] 05/23/2019, 9:08 AM

## 2019-05-24 ENCOUNTER — Inpatient Hospital Stay (HOSPITAL_COMMUNITY): Payer: Medicare Other

## 2019-05-24 DIAGNOSIS — J9600 Acute respiratory failure, unspecified whether with hypoxia or hypercapnia: Secondary | ICD-10-CM

## 2019-05-24 DIAGNOSIS — G9340 Encephalopathy, unspecified: Secondary | ICD-10-CM

## 2019-05-24 LAB — MAGNESIUM
Magnesium: 2.2 mg/dL (ref 1.7–2.4)
Magnesium: 2.5 mg/dL — ABNORMAL HIGH (ref 1.7–2.4)

## 2019-05-24 LAB — BASIC METABOLIC PANEL
Anion gap: 16 — ABNORMAL HIGH (ref 5–15)
BUN: 84 mg/dL — ABNORMAL HIGH (ref 6–20)
CO2: 21 mmol/L — ABNORMAL LOW (ref 22–32)
Calcium: 7.7 mg/dL — ABNORMAL LOW (ref 8.9–10.3)
Chloride: 98 mmol/L (ref 98–111)
Creatinine, Ser: 10.37 mg/dL — ABNORMAL HIGH (ref 0.44–1.00)
GFR calc Af Amer: 4 mL/min — ABNORMAL LOW (ref >60)
GFR calc non Af Amer: 4 mL/min — ABNORMAL LOW (ref >60)
Glucose, Bld: 181 mg/dL — ABNORMAL HIGH (ref 70–99)
Potassium: 4.1 mmol/L (ref 3.5–5.1)
Sodium: 135 mmol/L (ref 135–145)

## 2019-05-24 LAB — PHOSPHORUS
Phosphorus: 7.7 mg/dL — ABNORMAL HIGH (ref 2.5–4.6)
Phosphorus: 9 mg/dL — ABNORMAL HIGH (ref 2.5–4.6)

## 2019-05-24 LAB — GLUCOSE, CAPILLARY
Glucose-Capillary: 185 mg/dL — ABNORMAL HIGH (ref 70–99)
Glucose-Capillary: 169 mg/dL — ABNORMAL HIGH (ref 70–99)
Glucose-Capillary: 216 mg/dL — ABNORMAL HIGH (ref 70–99)
Glucose-Capillary: 207 mg/dL — ABNORMAL HIGH (ref 70–99)
Glucose-Capillary: 164 mg/dL — ABNORMAL HIGH (ref 70–99)
Glucose-Capillary: 220 mg/dL — ABNORMAL HIGH (ref 70–99)

## 2019-05-24 LAB — CULTURE, BLOOD (ROUTINE X 2): Special Requests: ADEQUATE

## 2019-05-24 LAB — CBC
HCT: 28.2 % — ABNORMAL LOW (ref 36.0–46.0)
Hemoglobin: 8.8 g/dL — ABNORMAL LOW (ref 12.0–15.0)
MCH: 24.1 pg — ABNORMAL LOW (ref 26.0–34.0)
MCHC: 31.2 g/dL (ref 30.0–36.0)
MCV: 77.3 fL — ABNORMAL LOW (ref 80.0–100.0)
Platelets: 195 10*3/uL (ref 150–400)
RBC: 3.65 MIL/uL — ABNORMAL LOW (ref 3.87–5.11)
RDW: 19.9 % — ABNORMAL HIGH (ref 11.5–15.5)
WBC: 6.7 10*3/uL (ref 4.0–10.5)
nRBC: 0 % (ref 0.0–0.2)

## 2019-05-24 MED ORDER — FENTANYL CITRATE (PF) 100 MCG/2ML IJ SOLN: 25.0000 ug | INTRAMUSCULAR | Status: DC | PRN

## 2019-05-24 MED ORDER — LABETALOL HCL 100 MG PO TABS
100.0000 mg | ORAL_TABLET | Freq: Two times a day (BID) | ORAL | Status: DC
  Administered 2019-05-24 – 2019-05-26 (×6): 100 mg
  Filled 2019-05-24 (×7): qty 1

## 2019-05-24 NOTE — Progress Notes (Signed)
NAME:  Lauren Warner, MRN:  [id number], DOB:  11/28/1965, LOS: 2 ADMISSION DATE:  05/07/2019, CONSULTATION DATE:  05/24/19 REFERRING MD:  , CHIEF COMPLAINT:  Seizures, respiratory failure   Brief History   53 y.o. F with PMH of ESRD on HD, CHF, CAD, PE, DM BIBEMS after being found seizing with hypothermia, right-sided lung infiltrate and DKA. She had missed dialysis had had a K of 7.7  Poor mental status- MRI with bilateral signal difects concerning for hypoxic ischemic changes vs PRES  Past Medical History   has a past medical history of Anemia, Anxiety, Asthma, CHF (congestive heart failure) ([place]), Coronary artery disease, Daily headache, Depression, ESRD (end stage renal disease) on dialysis ([place]), High cholesterol, History of blood transfusion (10/19/2017), Hyperkalemia (10/2017), Hypertension, On home oxygen therapy, Pneumonia, Pulmonary embolism ([place]) (2012), and Type 1 diabetes mellitus ([place]).   Significant Hospital Events   12/20 admit to ICU 12/21 transitioning off insulin gtt   Consults:  Nephrology Neurology  Procedures:  12/20 ETT >> [facility] 12/20 >>  Significant Diagnostic Tests:  12/20 initial head CT >> neg 12/20 CT Head>> rapidly worsening scan with new cytotoxic edema along bilateral cerebral convexities with infarcts 12/20 ECHO> -ECHO LVEF 60-65; grade II diastolic dysfunction. Mildly elevated RVSP 36.9  12/20 MRI brain> Signal abnormality in R cerebral hemisphere and bilateral basal ganglia. (Read as favored to reflect global hypoxic-ischemic injury with infarcts as the seen asymmetry and degree of restricted diffusion are atypical for PRES)  Chest x-ray 12/20  improved bilateral interstitial opacities Micro Data:  12/20 BCx2> GPC>>>coag neg staph  12/20 Sars-Cov-2>>negative  Antimicrobials:  Vancomycin 12/19- Cefepime 12/19-  Interim history/subjective:  No sig change overnight.  Remains on cleviprex gtt.   Objective   Blood pressure 127/62, pulse 90,  temperature 98.4 F (36.9 C), resp. rate 12, height 5\' 7"  (1.702 m), weight 88.1 kg, SpO2 100 %.    Vent Mode: PSV;CPAP FiO2 (%):  [40 %] 40 % Set Rate:  [18 bmp-24 bmp] 18 bmp Vt Set:  [480 mL] 480 mL PEEP:  [5 cmH20] 5 cmH20 Pressure Support:  [10 cmH20] 10 cmH20 Plateau Pressure:  [17 cmH20-20 cmH20] 20 cmH20   Intake/Output Summary (Last 24 hours) at 05/24/2019 [id number] Last data filed at 05/24/2019 0600 Gross per 24 hour  Intake 1652.44 ml  Output 300 ml  Net 1352.44 ml   Filed Weights   05/22/19 0210 05/22/19 0550 05/24/19 0416  Weight: 85 kg 85 kg 88.1 kg    General:  Chronically ill appearing female, NAD on vent  HEENT: MM pink/moist, ETT Neuro: on no sedation, Withdraws to pain, no gag, does breathe over vent, pupils 42mm sluggish CV: s1s2 rrr, no m/r/g PULM:  resps even non labored on PS 10/5, few scattered rhonchi  GI: soft, bsx4 active  Extremities: warm/dry, no edema, L AKA Skin: no rashes or lesions   Resolved Hospital Problem list   Hyperkalemia   Assessment & Plan:   Acute encephalopathy due to seizures, abnormal MRI with bilateral signal abnormalities favored to represent hypoxic-ischemic injury with infarcts.  Neurology also suspects significant degree PRES therefore suggests waiting 7days before full prognostication  -had similar seizure last admission, thought to be r/t metabolic derangement.  -unknown down-time, daughter lives out of state and had not spoken to her in almost a week P: -appreciate Neurology recommendations, continue Keppra, seizure precautions  -SBP goal < 160 -s2hr neuro exam   Acute respiratory failure requiring intubation -intubated for airway protection P Vent  support - 8cc/kg  F/u CXR  F/u ABG PS wean as tol - mental status precludes extubation  -VAP bundle -Off sedation  -Bronchial hygiene and RT/bronchodilator protocol -mental status will be ongoing barrier toward extubation, continue to attempt weaning support as able    ESRD  P: -appreciate nephrology assistance. -TTS HD  DKA secondary to Type 1 DM -pH 7.26, received calcium, insulin and 1 amp bicarb in the ED -suspect patient is not using insulin at home P: -Endotool for transition off of gtt now that b HB is normalized   Possible Sepsis -?CAP -poorly visualized 61mm opacity vs nodule in the RUL More likely infiltrates related to volume but cannot rule out aspiration, also note hypothermia on admit -Covid and flu negative   GPC in blood P: -given leukocytosis will treat empirically for now -Changed from cefepime to ceftriaxone to avoid neurotoxicity -continue vanc for GPC in blood for now - 1/2 coag neg staph - can likely stop empiric abx if other set [facility] neg  Hypertensive urgency History of CAD, CHF, HTN -Trop minimally elevated and flat -ECHO LVEF 60-65; grade II diastolic dysfunction. Mildly elevated RVSP 36.9  P: -check echocardiogram -continue plavix and Asa -Continue cleviprex  - add enteral labetalol and attempt wean cleviprex   Inadequate PO intake P -continue TF   Best practice:  Diet: EN per RDN  Pain/Anxiety/Delirium protocol (if indicated):  Fentanyl prn VAP protocol (if indicated): yes DVT prophylaxis: heparin, SCD GI prophylaxis: PPI Glucose control: SSI Mobility: bed rest Code Status: full Family Communication: daughter out of state via phone  Disposition: ICU  CRITICAL CARE Performed by: Darlina Sicilian   Total critical care time: 40 minutes  Nickolas Madrid, NP Pulmonary/Critical Care Medicine  05/24/2019  9:27 AM

## 2019-05-24 NOTE — Progress Notes (Signed)
[facility] ROUNDING NOTE   Subjective:   This is a 53 year old lady with end-stage renal disease Tuesday Thursday Saturday dialysis with poor compliance she has a history of congestive heart failure coronary artery disease history of pulmonary embolus diabetes mellitus she was brought to the emergency room after her daughter was unable to reach her she was found seizing and covered in stool.  She was intubated and brought to the emergency room she was loaded with Keppra given Ativan.  She received urgent dialysis for hyperkalemia.  Blood pressure 130/65 pulse 80 temperature 98 O2 sats 100% FiO2 40  Sodium 135 potassium 4.1 chloride 98 CO2 21 BUN 84 creatinine 10.3 glucose 181 calcium 7.7 phosphorus 7.7 magnesium 2.2 WBC 6.7 hemoglobin 8.8 platelets 195  Aspirin 81 mg daily Plavix 75 mg daily, Neurontin 100 mg daily, Insulin sliding scale, insulin detemir 10 units, Protonix 40 mg daily  IV Rocephin 1 g every 24 hours IV Keppra 500 mg every 12 hours IV vancomycin   Objective:  Vital signs in last 24 hours:  Temp:  [98.4 F (36.9 C)-99.3 F (37.4 C)] 98.4 F (36.9 C) (12/22 0700) Pulse Rate:  [90-103] 90 (12/22 0700) Resp:  [12-25] 12 (12/22 0700) BP: (125-154)/(55-72) 127/62 (12/22 0700) SpO2:  [100 %] 100 % (12/22 0746) FiO2 (%):  [40 %] 40 % (12/22 0746) Weight:  [88.1 kg] 88.1 kg (12/22 0416)  Weight change:  Filed Weights   05/22/19 0210 05/22/19 0550 05/24/19 0416  Weight: 85 kg 85 kg 88.1 kg    Intake/Output: I/O last 3 completed shifts: In: 2374.6 [I.V.:1870.7; NG/GT:107.3; IV Piggyback:396.6] Out: 300 [Urine:100; Stool:200]   Intake/Output this shift:  No intake/output data recorded.  General:chronically ill appearing female in NAD, intubated and sedated Heart:RRR, +3/8 systolic murmur Lungs:grossly clear Abdomen:soft, NTND Extremities:L AKA, R - no edema Dialysis Access: LU AVF +b    Basic Metabolic Panel: Recent Labs  Lab 05/22/19 1427  05/23/19 0104 05/23/19 0623 05/23/19 1515 05/24/19 0342  NA 139 138 137 136 135  K 4.0 3.8 3.8 3.7 4.1  CL 99 98 97* 98 98  CO2 21* 21* 21* 22 21*  GLUCOSE 142* 166* 128* 146* 181*  BUN 74* 72* 74* 73* 84*  CREATININE 8.93* 9.46* 9.46* 9.83* 10.37*  CALCIUM 8.2* 8.3* 8.3* 7.8* 7.7*  MG  --   --  2.2 2.1 2.2  PHOS  --   --   --  6.2* 7.7*    Liver Function Tests: Recent Labs  Lab 05/28/2019 2004  AST 26  ALT 11  ALKPHOS 51  BILITOT 1.1  PROT 6.3*  ALBUMIN 3.3*   No results for input(s): LIPASE, AMYLASE in the last 168 hours. Recent Labs  Lab 05/10/2019 2004  AMMONIA 35    CBC: Recent Labs  Lab 06/02/2019 2004 05/06/2019 2015 05/23/2019 2328 05/22/19 0225 05/23/19 0623 05/24/19 0342  WBC 14.7*  --   --  14.9* 7.8 6.7  NEUTROABS 13.1*  --   --   --   --   --   HGB 9.9* 11.6*  11.2* 10.5* 9.2* 9.2* 8.8*  HCT 33.5* 34.0*  33.0* 31.0* 29.4* 29.4* 28.2*  MCV 80.1  --   --  75.6* 76.4* 77.3*  PLT 234  --   --  215 171 195    Cardiac Enzymes: Recent Labs  Lab 05/16/2019 2004  CKTOTAL 451*    BNP: Invalid input(s): POCBNP  CBG: Recent Labs  Lab 05/23/19 1534 05/23/19 1950 05/23/19 2323  05/24/19 0325 05/24/19 0855  GLUCAP 136* 158* 149* 185* 169*    Microbiology: Results for orders placed or performed during the hospital encounter of 05/07/2019  Respiratory Panel by RT PCR (Flu A&B, Covid) - Nasopharyngeal Swab     Status: None   Collection Time: 05/28/2019  8:11 PM   Specimen: Nasopharyngeal Swab  Result Value Ref Range Status   SARS Coronavirus 2 by RT PCR NEGATIVE NEGATIVE Final    Comment: (NOTE) SARS-CoV-2 target nucleic acids are NOT DETECTED. The SARS-CoV-2 RNA is generally detectable in upper respiratoy specimens during the acute phase of infection. The lowest concentration of SARS-CoV-2 viral copies this assay can detect is 131 copies/mL. A negative result does not preclude SARS-Cov-2 infection and should not be used as the sole basis for  treatment or other patient management decisions. A negative result may occur with  improper specimen collection/handling, submission of specimen other than nasopharyngeal swab, presence of viral mutation(s) within the areas targeted by this assay, and inadequate number of viral copies (<131 copies/mL). A negative result must be combined with clinical observations, patient history, and epidemiological information. The expected result is Negative. Fact Sheet for Patients:  PinkCheek.be Fact Sheet for Healthcare Providers:  GravelBags.it This test is not yet ap proved or cleared by the [place] FDA and  has been authorized for detection and/or diagnosis of SARS-CoV-2 by FDA under an Emergency Use Authorization (EUA). This EUA will remain  in effect (meaning this test can be used) for the duration of the COVID-19 declaration under Section 564(b)(1) of the Act, 21 U.S.C. section 360bbb-3(b)(1), unless the authorization is terminated or revoked sooner.    Influenza A by PCR NEGATIVE NEGATIVE Final   Influenza B by PCR NEGATIVE NEGATIVE Final    Comment: (NOTE) The Xpert Xpress SARS-CoV-2/FLU/RSV assay is intended as an aid in  the diagnosis of influenza from Nasopharyngeal swab specimens and  should not be used as a sole basis for treatment. Nasal washings and  aspirates are unacceptable for Xpert Xpress SARS-CoV-2/FLU/RSV  testing. Fact Sheet for Patients: PinkCheek.be Fact Sheet for Healthcare Providers: GravelBags.it This test is not yet approved or cleared by the [place] FDA and  has been authorized for detection and/or diagnosis of SARS-CoV-2 by  FDA under an Emergency Use Authorization (EUA). This EUA will remain  in effect (meaning this test can be used) for the duration of the  Covid-19 declaration under Section 564(b)(1) of the Act, 21  U.S.C. section  360bbb-3(b)(1), unless the authorization is  terminated or revoked. Performed at [facility], [place] [address], [place], [place] [address]   Blood culture (routine x 2)     Status: Abnormal   Collection Time: 05/07/2019  9:25 PM   Specimen: BLOOD  Result Value Ref Range Status   Specimen Description BLOOD SITE NOT SPECIFIED  Final   Special Requests   Final    BOTTLES DRAWN AEROBIC AND ANAEROBIC Blood Culture adequate volume   Culture  Setup Time   Final    GRAM POSITIVE COCCI IN BOTH AEROBIC AND ANAEROBIC BOTTLES D. PIERCE, PHARMD AT [facility] ON 05/22/19 BY C. JESSUP, MT.    Culture (A)  Final    STAPHYLOCOCCUS SPECIES (COAGULASE NEGATIVE) THE SIGNIFICANCE OF ISOLATING THIS ORGANISM FROM A SINGLE SET OF BLOOD CULTURES WHEN MULTIPLE SETS ARE DRAWN IS UNCERTAIN. PLEASE NOTIFY THE MICROBIOLOGY DEPARTMENT WITHIN ONE WEEK IF SPECIATION AND SENSITIVITIES ARE REQUIRED. Performed at [facility], [place] [address] [place], [place]  [address]    Report Status 05/24/2019 FINAL  Final  Blood culture (routine x 2)     Status: None (Preliminary result)   Collection Time: 05/22/19 12:26 AM   Specimen: BLOOD RIGHT FOREARM  Result Value Ref Range Status   Specimen Description BLOOD RIGHT FOREARM  Final   Special Requests   Final    BOTTLES DRAWN AEROBIC AND ANAEROBIC Blood Culture adequate volume   Culture   Final    NO GROWTH 2 DAYS Performed at [facility], [place] [address] [place], [place] [address]    Report Status PENDING  Incomplete  MRSA PCR Screening     Status: None   Collection Time: 05/22/19  1:55 AM   Specimen: Nasal Mucosa; Nasopharyngeal  Result Value Ref Range Status   MRSA by PCR NEGATIVE NEGATIVE Final    Comment:        The GeneXpert MRSA Assay (FDA approved for NASAL specimens only), is one component of a comprehensive MRSA colonization surveillance program. It is not intended to diagnose MRSA infection nor to guide or monitor treatment for MRSA  infections. Performed at [facility], [place] [address], [place], [place] [address]     Coagulation Studies: Recent Labs    05/25/2019 Aug 10, 2002  LABPROT 15.4*  INR 1.2    Urinalysis: Recent Labs    05/19/2019 Aug 11, 2123  COLORURINE YELLOW  LABSPEC 1.013  PHURINE 6.0  GLUCOSEU >=500*  HGBUR NEGATIVE  BILIRUBINUR NEGATIVE  KETONESUR 20*  PROTEINUR >=300*  NITRITE NEGATIVE  LEUKOCYTESUR NEGATIVE      Imaging: EEG  Result Date: 05/22/2019 [name], MD     05/22/2019  1:23 PM ELECTROENCEPHALOGRAM REPORT Patient: Lauren Warner       Room #: 2X52W EEG No. ID: [id number] Age: 53 y.o.        Sex: female Referring Physician: [name] Report Date:  05/22/2019       Interpreting Physician: [name] History: Roselina Burgueno is an 53 y.o. female found down with seizure like activity.  Medications: ASA, Vancomycin, Rocephin, Plavix, Neurontin, Keppra, Vancomycin, Cleviprex, Insulin, Diprovan Conditions of Recording:  This is a 21 channel routine scalp EEG performed with bipolar and monopolar montages arranged in accordance to the international 10/20 system of electrode placement. One channel was dedicated to EKG recording. The patient is in the intubated and sedated state. Description:  The background activity is slow and poorly organized.  It consists of a low voltage polymorphic delta activity that is continuous and diffusely distributed.  There is no activation of the background noted with stimulation.  No epileptiform activity is noted.  Hyperventilation and intermittent photic stimulation were not performed. IMPRESSION: This is an abnormal EEG secondary to general background slowing.  This finding may be seen with a diffuse disturbance that is etiologically nonspecific, but may include a metabolic encephalopathy or medication effect, among other possibilities.  No epileptiform activity was noted.  [name], MD Neurology [phone] 05/22/2019, 1:17 PM   MR BRAIN WO CONTRAST  Result  Date: 05/22/2019 CLINICAL DATA:  Seizure. End-stage renal disease on dialysis. EXAM: MRI HEAD WITHOUT CONTRAST TECHNIQUE: Multiplanar, multiecho pulse sequences of the brain and surrounding structures were obtained without intravenous contrast. COMPARISON:  Head CT 05/22/2019 and MRI 10/19/2017 FINDINGS: Brain: There is extensive T2 and diffusion weighted signal abnormality throughout the right cerebral hemisphere with associated reduced ADC involving cortex and white matter with diffuse gyral swelling. All lobes are involved including the entirety of the right parietal lobe, and  there is involvement of multiple vascular territories. Smaller regions of cortically based restricted diffusion are present in the left cerebral hemisphere involving the frontal and parietal lobes as well as insula. There is also subcortical vasogenic type edema in the left parietal lobe. There is T2 hyperintensity and mild diffusion weighted signal abnormality diffusely in the basal ganglia which reflects mixed restricted and facilitated diffusion. The thalami do not appear significantly affected, although there is patchy T2 hyperintensity in the cerebellum and brainstem without restricted diffusion. A small amount of petechial hemorrhage is present in the left parietal lobe, and there are also multiple chronic microhemorrhages in the frontal and parietal lobes bilaterally which have increased in number from the prior MRI. The lateral ventricles are partially effaced due to basal ganglia and right temporal lobe edema. There is no evidence of herniation. No extra-axial fluid collection is present. Vascular: Major intracranial vascular flow voids are preserved. Skull and upper cervical spine: No suspicious marrow lesion. Sinuses/Orbits: Bilateral cataract extraction. Mild mucosal thickening in the paranasal sinuses. Small left mastoid effusion. Other: Scalp swelling/fluid. IMPRESSION: Extensive signal abnormality throughout the right  cerebral hemisphere and bilateral basal ganglia with less pronounced left cerebral and posterior fossa involvement, favored to reflect global hypoxic-ischemic injury with infarcts. The degree of restricted diffusion and asymmetry are atypical of posterior reversible encephalopathy syndrome. Electronically Signed   By: Logan Bores M.D.   On: 05/22/2019 17:15   DG CHEST PORT 1 VIEW  Result Date: 05/24/2019 CLINICAL DATA:  Respiratory failure. EXAM: PORTABLE CHEST 1 VIEW COMPARISON:  05/23/2019 FINDINGS: The endotracheal tube is 3 cm above the carina. The left IJ central venous catheter is stable. The NG tube is coursing down the esophagus and into the stomach. Stable mild cardiac enlargement. Streaky left basilar atelectasis but no definite infiltrates or effusions. IMPRESSION: 1. Stable support apparatus. 2. Streaky left basilar atelectasis but no infiltrates or effusions. Electronically Signed   By: Marijo Sanes M.D.   On: 05/24/2019 06:35   DG Chest Port 1 View  Result Date: 05/23/2019 CLINICAL DATA:  Intubation.  Acute respiratory failure. EXAM: PORTABLE CHEST 1 VIEW COMPARISON:  05/22/2019.  05/13/2019. FINDINGS: Endotracheal tube, NG tube, left IJ line stable position. Cardiomegaly. Slight increase interstitial markings noted on today's exam. CHF could present in this fashion. Pneumonitis cannot be excluded. No pleural effusion or pneumothorax. IMPRESSION: 1.  Lines and tubes stable position. 2. Cardiomegaly. Slight increase in interstitial markings noted on today's exam. CHF could present in this fashion. Pneumonitis cannot be excluded. Electronically Signed   By: Marcello Moores  Register   On: 05/23/2019 05:54   ECHOCARDIOGRAM COMPLETE  Result Date: 05/22/2019   ECHOCARDIOGRAM REPORT   Patient Name:   ALEEAH GREENO Date of Exam: 05/22/2019 Medical Rec #:  [id number]     Height:       67.0 in Accession #:    [id number]    Weight:       187.4 lb Date of Birth:  Dec 21, 1965    BSA:          1.97 m Patient  Age:    31 years      BP:           118/70 mmHg Patient Gender: F             HR:           93 bpm. Exam Location:  Inpatient Procedure: 2D Echo, Cardiac Doppler and Color Doppler Indications:    J68.11 Acute diastolic (congestive) heart  failure  History:        Patient has prior history of Echocardiogram examinations, most                 recent 10/21/2017. CHF, CAD, Signs/Symptoms:Dyspnea and Syncope;                 Risk Factors:Diabetes and Hypertension. Hypoxia. ESRD.  Sonographer:    Roseanna Rainbow RDCS Referring Phys: [id number] [place]  1. Left ventricular ejection fraction, by visual estimation, is 60 to 65%. The left ventricle has normal function. There is severely increased left ventricular hypertrophy.  2. Left ventricular diastolic parameters are consistent with Grade II diastolic dysfunction (pseudonormalization).  3. The left ventricle has no regional wall motion abnormalities.  4. Global right ventricle has normal systolic function.The right ventricular size is normal. No increase in right ventricular wall thickness.  5. Left atrial size was normal.  6. Right atrial size was normal.  7. Moderate pleural effusion in the left lateral region.  8. The mitral valve is normal in structure. Trivial mitral valve regurgitation.  9. The tricuspid valve is normal in structure. Tricuspid valve regurgitation is mild. 10. The aortic valve is tricuspid. Aortic valve regurgitation is not visualized. Mild aortic valve sclerosis without stenosis. 11. The pulmonic valve was grossly normal. Pulmonic valve regurgitation is not visualized. 12. Mildly elevated pulmonary artery systolic pressure. 13. The tricuspid regurgitant velocity is 2.69 m/s, and with an assumed right atrial pressure of 8 mmHg, the estimated right ventricular systolic pressure is mildly elevated at 36.9 mmHg. FINDINGS  Left Ventricle: Left ventricular ejection fraction, by visual estimation, is 60 to 65%. The left ventricle has normal  function. The left ventricle has no regional wall motion abnormalities. There is severely increased left ventricular hypertrophy. Concentric left ventricular hypertrophy. Left ventricular diastolic parameters are consistent with Grade II diastolic dysfunction (pseudonormalization). Right Ventricle: The right ventricular size is normal. No increase in right ventricular wall thickness. Global RV systolic function is has normal systolic function. The tricuspid regurgitant velocity is 2.69 m/s, and with an assumed right atrial pressure  of 8 mmHg, the estimated right ventricular systolic pressure is mildly elevated at 36.9 mmHg. Left Atrium: Left atrial size was normal in size. Right Atrium: Right atrial size was normal in size Pericardium: There is no evidence of pericardial effusion. There is a moderate pleural effusion in the left lateral region. Mitral Valve: The mitral valve is normal in structure. Trivial mitral valve regurgitation. Tricuspid Valve: The tricuspid valve is normal in structure. Tricuspid valve regurgitation is mild. Aortic Valve: The aortic valve is tricuspid. Aortic valve regurgitation is not visualized. Mild aortic valve sclerosis is present, with no evidence of aortic valve stenosis. Pulmonic Valve: The pulmonic valve was grossly normal. Pulmonic valve regurgitation is not visualized. Pulmonic regurgitation is not visualized. Aorta: The aortic root, ascending aorta and aortic arch are all structurally normal, with no evidence of dilitation or obstruction. Venous: IVC assessment for right atrial pressure unable to be performed due to mechanical ventilation. IAS/Shunts: No atrial level shunt detected by color flow Doppler.  LEFT VENTRICLE PLAX 2D LVIDd:         3.40 cm       Diastology LVIDs:         2.20 cm       LV e' lateral:   6.20 cm/s LV PW:         1.90 cm       LV E/e' lateral: 13.8  LV IVS:        1.80 cm       LV e' medial:    5.77 cm/s LVOT diam:     1.90 cm       LV E/e' medial:  14.9 LV  SV:         31 ml LV SV Index:   15.40 LVOT Area:     2.84 cm  LV Volumes (MOD) LV area d, A2C:    24.40 cm LV area d, A4C:    25.00 cm LV area s, A2C:    11.40 cm LV area s, A4C:    14.43 cm LV major d, A2C:   6.98 cm LV major d, A4C:   6.63 cm LV major s, A2C:   5.29 cm LV major s, A4C:   5.55 cm LV vol d, MOD A2C: 70.1 ml LV vol d, MOD A4C: 77.9 ml LV vol s, MOD A2C: 21.2 ml LV vol s, MOD A4C: 33.0 ml LV SV MOD A2C:     48.9 ml LV SV MOD A4C:     77.9 ml LV SV MOD BP:      48.6 ml RIGHT VENTRICLE             IVC RV S prime:     12.60 cm/s  IVC diam: 2.30 cm TAPSE (M-mode): 2.3 cm LEFT ATRIUM             Index       RIGHT ATRIUM           Index LA diam:        2.20 cm 1.12 cm/m  RA Area:     14.20 cm LA Vol (A2C):   28.8 ml 14.64 ml/m RA Volume:   35.00 ml  17.79 ml/m LA Vol (A4C):   38.1 ml 19.37 ml/m LA Biplane Vol: 35.7 ml 18.15 ml/m  AORTIC VALVE LVOT Vmax:   161.00 cm/s LVOT Vmean:  108.000 cm/s LVOT VTI:    0.296 m  AORTA Ao Root diam: 3.00 cm Ao Asc diam:  3.20 cm MITRAL VALVE                        TRICUSPID VALVE MV Area (PHT): 4.21 cm             TR Peak grad:   28.9 mmHg MV PHT:        52.20 msec           TR Vmax:        269.00 cm/s MV Decel Time: 180 msec MV E velocity: 85.70 cm/s 103 cm/s  SHUNTS MV A velocity: 76.50 cm/s 70.3 cm/s Systemic VTI:  0.30 m MV E/A ratio:  1.12       1.5       Systemic Diam: 1.90 cm  Buford Dresser MD Electronically signed by Buford Dresser MD Signature Date/Time: 05/22/2019/4:16:32 PM    Final      Medications:   . sodium chloride 50 mL/hr at 05/24/19 5993  . cefTRIAXone (ROCEPHIN)  IV Stopped (05/23/19 1354)  . clevidipine 4 mg/hr (05/24/19 0620)  . feeding supplement (VITAL 1.5 CAL) 1,000 mL (05/23/19 1319)  . levETIRAcetam Stopped (05/24/19 0000)  . sodium chloride     . aspirin  81 mg Per Tube Daily  . chlorhexidine gluconate (MEDLINE KIT)  15 mL Mouth Rinse BID  . Chlorhexidine Gluconate Cloth  6 each Topical Q0600  .  clopidogrel  75 mg Per Tube Daily  . feeding supplement (PRO-STAT SUGAR FREE 64)  30 mL Per Tube TID  . gabapentin  100 mg Per Tube Daily  . heparin  5,000 Units Subcutaneous Q8H  . insulin aspart  0-6 Units Subcutaneous Q4H  . insulin detemir  10 Units Subcutaneous Daily  . labetalol  100 mg Per Tube BID  . latanoprost  1 drop Both Eyes QHS  . mouth rinse  15 mL Mouth Rinse 10 times per day  . pantoprazole sodium  40 mg Per Tube Daily  . sodium chloride flush  10-40 mL Intracatheter Q12H   albuterol, dextrose, fentaNYL (SUBLIMAZE) injection, fentaNYL (SUBLIMAZE) injection, labetalol, sodium chloride flush  Assessment/ Plan:  1. AMS/Seizure/metabolic encephalopathy - EEG shows no epileptiform activity noted, general background slowing noted which could be from multiple etiologies.  CT suggesting PRES vs anoxic injury vs watershed infarcts.  MRI examination of the brain 12/20 Extensive signal abnormality throughout the right cerebral hemisphere and bilateral basal ganglia with less pronounced left cerebral and posterior fossa involvement, favored to reflect global hypoxic-ischemic injury with infarcts.  Appreciate assistance from Dr. Leonel Ramsay will continue Keppra. 2. Hyperkalemia 2/2 non compliance with HD - improved post HD. K  3. Acute Resp Failure w/Hypoxia - intubated on mechanical ventilation 4. ESRD - on HD TTS. Non compliant.  Urgent HD last night due to [place].  Her next dialysis planned for 05/24/2019 5. Anemia of CKD- Hgb 9.2. ESA recently dosed, not due until 12/26 6. Secondary hyperparathyroidism - Ca at goal. Hold calcitriol due to being sedated/intubated. 7. HTN/volume - BP now improved.  Pulmonary edema improved on repeat CXR.    Will continue to challenge dry weight 8.  Sepsis with gram-positive blood cultures continue antibiotics at this time    LOS: [place] _0 _1 :51 AM

## 2019-05-24 NOTE — Progress Notes (Addendum)
Neurology Progress Note   S:// No new neuro events overnight.  Patient has no received any sedation and is currently intubated.  Insulin drip has been turned off and now on subcutaneous heparin.  Still on cleveprex for BP control.  RN at bedside and reports no new issues   O:// Current vital signs: BP 127/62   Pulse 90   Temp 98.4 F (36.9 C)   Resp 12   Ht 5' 7"  (1.702 m)   Wt 88.1 kg   SpO2 100%   BMI 30.42 kg/m  Vital signs in last 24 hours: Temp:  [98.4 F (36.9 C)-99.3 F (37.4 C)] 98.4 F (36.9 C) (12/22 0700) Pulse Rate:  [90-107] 90 (12/22 0700) Resp:  [12-25] 12 (12/22 0700) BP: (113-176)/(53-72) 127/62 (12/22 0700) SpO2:  [100 %] 100 % (12/22 0746) FiO2 (%):  [40 %] 40 % (12/22 0746) Weight:  [88.1 kg] 88.1 kg (12/22 0416)   GENERAL: obtunded and intubated, no sedation HENT: - Normocephalic and atraumatic, dry mm, ET tube in place  Eyes: chemosis of Conjunctiva LUNGS - Rhonchi bilaterally with no wheezes, thick tan secretions CV - S1S2 RRR, no m/r/g, equal pulses bilaterally. ABDOMEN - Soft, nontender, nondistended with normoactive BS Ext: left BKA, Left arm AV fistula +thrill/bruit, warm, well perfused, intact peripheral pulses, no  Edema Psych: obtunded  NEURO:  Mental Status: obtunded, opens eyes slightly with sternal rub, does not follow commands Language: nonverbal, intubated  Cranial Nerves: PERRL 45m/sluggish.negative dolls, visual fields no blink to threat, weak corneal bilaterally,unable to test facial asymmetry, hearing intact, weak cough/gag, sternocleidomastoid and trapezius muscle strength. No evidence of tongue atrophy or fibrillations Motor: flickers/flexion to pain in extremities Tone: is normal and bulk is normal Sensation- responds to noxious stimuli  Coordination: FTN unable to test  Gait- deferred    Medications  Current Facility-Administered Medications:  .  0.9 %  sodium chloride infusion, , Intravenous, Continuous, Bowser, Grace  E, NP, Last Rate: 50 mL/hr at 05/24/19 0623, New Bag at 05/24/19 05170.  albuterol (PROVENTIL) (2.5 MG/3ML) 0.083% nebulizer solution 2.5 mg, 2.5 mg, Inhalation, Q6H PRN, Gleason, LOtilio Carpen PA-C .  aspirin chewable tablet 81 mg, 81 mg, Per Tube, Daily, ARigoberto Noel MD, 81 mg at 05/23/19 0916 .  cefTRIAXone (ROCEPHIN) 1 g in sodium chloride 0.9 % 100 mL IVPB, 1 g, Intravenous, Q24H, ARigoberto Noel MD, Stopped at 05/23/19 1354 .  chlorhexidine gluconate (MEDLINE KIT) (PERIDEX) 0.12 % solution 15 mL, 15 mL, Mouth Rinse, BID, Lannan, Margo T, MD, 15 mL at 05/24/19 0738 .  Chlorhexidine Gluconate Cloth 2 % PADS 6 each, 6 each, Topical, Q0600, SAnders Simmonds MD, 6 each at 05/23/19 2217 .  clevidipine (CLEVIPREX) infusion 0.5 mg/mL, 0-21 mg/hr, Intravenous, Continuous, Aroor, SLanice Schwab MD, Last Rate: 8 mL/hr at 05/24/19 0620, 4 mg/hr at 05/24/19 0620 .  clopidogrel (PLAVIX) tablet 75 mg, 75 mg, Per Tube, Daily, ARigoberto Noel MD, 75 mg at 05/23/19 0916 .  dextrose 50 % solution 0-50 mL, 0-50 mL, Intravenous, PRN, Gleason, LOtilio Carpen PA-C .  feeding supplement (PRO-STAT SUGAR FREE 64) liquid 30 mL, 30 mL, Per Tube, TID, SChesley Mires MD, 30 mL at 05/23/19 2216 .  feeding supplement (VITAL 1.5 CAL) liquid 1,000 mL, 1,000 mL, Per Tube, Continuous, Sood, Vineet, MD, Last Rate: 40 mL/hr at 05/23/19 1319, 1,000 mL at 05/23/19 1319 .  fentaNYL (SUBLIMAZE) injection 50 mcg, 50 mcg, Intravenous, Q15 min PRN, Gleason, LOtilio Carpen PA-C .  gabapentin (NEURONTIN) 250 MG/5ML solution 100 mg, 100 mg, Per Tube, Daily, Lannan, Margo T, MD, 100 mg at 05/23/19 0916 .  heparin injection 5,000 Units, 5,000 Units, Subcutaneous, Q8H, Gleason, Otilio Carpen, PA-C, 5,000 Units at 05/24/19 0534 .  insulin aspart (novoLOG) injection 0-6 Units, 0-6 Units, Subcutaneous, Q4H, Bowser, Laurel Dimmer, NP, 1 Units at 05/24/19 [id number] .  insulin detemir (LEVEMIR) injection 10 Units, 10 Units, Subcutaneous, Daily, Bowser, Laurel Dimmer, NP, 10 Units at  05/23/19 1058 .  labetalol (NORMODYNE) injection 10-20 mg, 10-20 mg, Intravenous, Q2H PRN, Aroor, Karena [place] R, MD .  latanoprost (XALATAN) 0.005 % ophthalmic solution 1 drop, 1 drop, Both Eyes, QHS, Gleason, Otilio Carpen, PA-C, 1 drop at 05/23/19 2217 .  levETIRAcetam (KEPPRA) IVPB 500 mg/100 mL premix, 500 mg, Intravenous, Q12H, Aroor, Lanice Schwab, MD, Stopped at 05/24/19 0000 .  MEDLINE mouth rinse, 15 mL, Mouth Rinse, 10 times per day, Daryll Brod T, MD, 15 mL at 05/24/19 0534 .  pantoprazole sodium (PROTONIX) 40 mg/20 mL oral suspension 40 mg, 40 mg, Per Tube, Daily, Daryll Brod T, MD, 40 mg at 05/23/19 0916 .  sodium chloride 0.9 % bolus 250 mL, 250 mL, Intravenous, Once, Gleason, Mickel Baas R, PA-C .  sodium chloride flush (NS) 0.9 % injection 10-40 mL, 10-40 mL, Intracatheter, Q12H, Rigoberto Noel, MD, 10 mL at 05/23/19 2216 .  sodium chloride flush (NS) 0.9 % injection 10-40 mL, 10-40 mL, Intracatheter, PRN, Rigoberto Noel, MD Labs CBC    Component Value Date/Time   WBC 6.7 05/24/2019 0342   RBC 3.65 (L) 05/24/2019 0342   HGB 8.8 (L) 05/24/2019 0342   HCT 28.2 (L) 05/24/2019 0342   PLT 195 05/24/2019 0342   MCV 77.3 (L) 05/24/2019 0342   MCH 24.1 (L) 05/24/2019 0342   MCHC 31.2 05/24/2019 0342   RDW 19.9 (H) 05/24/2019 0342   LYMPHSABS 1.0 05/25/2019 2004   MONOABS 0.4 05/17/2019 2004   EOSABS 0.0 05/08/2019 2004   BASOSABS 0.0 05/07/2019 2004    CMP     Component Value Date/Time   NA 135 05/24/2019 0342   K 4.1 05/24/2019 0342   CL 98 05/24/2019 0342   CO2 21 (L) 05/24/2019 0342   GLUCOSE 181 (H) 05/24/2019 0342   BUN 84 (H) 05/24/2019 0342   CREATININE 10.37 (H) 05/24/2019 0342   CALCIUM 7.7 (L) 05/24/2019 0342   PROT 6.3 (L) 05/20/2019 2004   ALBUMIN 3.3 (L) 05/13/2019 2004   AST 26 05/20/2019 2004   ALT 11 05/06/2019 2004   ALKPHOS 51 05/17/2019 2004   BILITOT 1.1 05/28/2019 2004   GFRNONAA 4 (L) 05/24/2019 0342   GFRAA 4 (L) 05/24/2019 0342    glycosylated  hemoglobin  Lipid Panel  No results found for: CHOL, TRIG, HDL, CHOLHDL, VLDL, LDLCALC, LDLDIRECT   Imaging I have reviewed images in epic and the results pertinent to this consultation are:  CT-scan of the brain 12/20 worsening scan with new cytotoxic edema appearance along the right more than left cerebral convexity and at the anterior caudate nuclei. Suspect global anoxic injury with infarcts. There is related sulcal and ventricular effacement without herniation or shift  MRI examination of the brain 12/20 Extensive signal abnormality throughout the right cerebral hemisphere and bilateral basal ganglia with less pronounced left cerebral and posterior fossa involvement, favored to reflect global hypoxic-ischemic injury with infarcts  EEG 12/20 no epileptiform activity, generalized slowing   Assessment:  Lauren Warner is a 53 yo female with past  medical history of CHF, ESRD on HD, HTN, HLD, DM 2 and history of PE found down by police after family unable to get in touch with her. Patient was noted to have seizure in route, was given versed and intubated for airway protection.  Patient is on no sedation and does not follow commands  Impression:  Acute encephalopathy 2/2 metabolic derangement, vs uremia vs DKA  Hypoxic Brain injury vs PRES  Seizures    Recommendations: Continue to support her and give her time to recover  Repeat MRI in a week  Continue with Keppra Continue to correct  metabolic abnormalities Frequent neuro checks Q2 hours Seizure precautions Continue to keep BP <160 HD scheduled for 12/22 per nephrology   Beulah Gandy, DNP, ACNP   I have seen the patient and reviewed the above note.  She has had a hypoxic brain injury, with the degree of infarction that we see on MRI, I would expect her to have continued deficits long-term.  If pres, she may have a better chance of some recovery, and therefore I would favor continuing to watch to see if she is making  progress.  Roland Rack, MD [facility] [phone]  If 7pm- 7am, please page neurology on call as listed in [place].

## 2019-05-25 ENCOUNTER — Inpatient Hospital Stay (HOSPITAL_COMMUNITY): Payer: Medicare Other

## 2019-05-25 LAB — CBC
HCT: 26.9 % — ABNORMAL LOW (ref 36.0–46.0)
Hemoglobin: 8.2 g/dL — ABNORMAL LOW (ref 12.0–15.0)
MCH: 23.8 pg — ABNORMAL LOW (ref 26.0–34.0)
MCHC: 30.5 g/dL (ref 30.0–36.0)
MCV: 78.2 fL — ABNORMAL LOW (ref 80.0–100.0)
Platelets: 155 10*3/uL (ref 150–400)
RBC: 3.44 MIL/uL — ABNORMAL LOW (ref 3.87–5.11)
RDW: 19.7 % — ABNORMAL HIGH (ref 11.5–15.5)
WBC: 6.4 10*3/uL (ref 4.0–10.5)
nRBC: 0 % (ref 0.0–0.2)

## 2019-05-25 LAB — BASIC METABOLIC PANEL
Anion gap: 17 — ABNORMAL HIGH (ref 5–15)
BUN: 94 mg/dL — ABNORMAL HIGH (ref 6–20)
CO2: 19 mmol/L — ABNORMAL LOW (ref 22–32)
Calcium: 7.5 mg/dL — ABNORMAL LOW (ref 8.9–10.3)
Chloride: 103 mmol/L (ref 98–111)
Creatinine, Ser: 10.84 mg/dL — ABNORMAL HIGH (ref 0.44–1.00)
GFR calc Af Amer: 4 mL/min — ABNORMAL LOW (ref >60)
GFR calc non Af Amer: 4 mL/min — ABNORMAL LOW (ref >60)
Glucose, Bld: 201 mg/dL — ABNORMAL HIGH (ref 70–99)
Potassium: 4.6 mmol/L (ref 3.5–5.1)
Sodium: 139 mmol/L (ref 135–145)

## 2019-05-25 LAB — GLUCOSE, CAPILLARY
Glucose-Capillary: 212 mg/dL — ABNORMAL HIGH (ref 70–99)
Glucose-Capillary: 207 mg/dL — ABNORMAL HIGH (ref 70–99)
Glucose-Capillary: 232 mg/dL — ABNORMAL HIGH (ref 70–99)
Glucose-Capillary: 226 mg/dL — ABNORMAL HIGH (ref 70–99)
Glucose-Capillary: 207 mg/dL — ABNORMAL HIGH (ref 70–99)

## 2019-05-25 LAB — MAGNESIUM: Magnesium: 2.4 mg/dL (ref 1.7–2.4)

## 2019-05-25 LAB — PHOSPHORUS: Phosphorus: 9.5 mg/dL — ABNORMAL HIGH (ref 2.5–4.6)

## 2019-05-25 MED ORDER — CHLORHEXIDINE GLUCONATE CLOTH 2 % EX PADS
6.0000 | MEDICATED_PAD | Freq: Every day | CUTANEOUS | Status: DC
  Administered 2019-05-26: 06:00:00 6 via TOPICAL

## 2019-05-25 NOTE — Progress Notes (Signed)
[facility] Progress Note   Dialysis Orders: [facility]  4h 84kg 2/2 L AVF Heparin none  - calc 0.25 three times per week  Assessment/Plan: 1.AMS/Seizure/metabolic encephalopathy- EEG shows no epileptiform activity noted, general background slowing noted which could be from multiple etiologies. CT suggesting PRES vs anoxic injury vs watershed infarcts. MRI examination of the brain12/20Extensive signal abnormality throughout the right cerebral hemisphere and bilateral basal ganglia with less pronounced left cerebral and posterior fossa involvement, favored to reflect global hypoxic-ischemic injury with infarcts.  Appreciate assistance from Dr. Leonel Ramsay will continue Keppra. 2. Hyperkalemia 2/2 non compliance with HD- improved post HD. 3.Acute Resp Failure w/Hypoxia - intubated on mechanical ventilation 4. ESRD- on HD TTS. K 4.6 - last HD 12/20 - plan HD today - she is off schedule for now- will run 3.5 hr due to high inpatient HD census  5. Anemiaof CKD-Hgb 9.2> 8.2 . ESA recently dosed, not due until 12/26 6. Secondary hyperparathyroidism- Ca at goal. Hold calcitriol due to being sedated/intubated. P 9.5  7.HTN/volume- Pulmonary edema improved on repeat CXR.   Will continue to challenge dry weight 8.  Sepsis with 1/2 + coag neg staph 9. Nutrition - TF vital1.5 at 40/hr  Alb low  Lauren Jacobson, PA-C [place] [phone] 05/25/2019,11:08 AM  LOS: 3 days   Subjective:  Last HD 12/20 -  Sedated on Vent with TF   Objective Vitals:   05/25/19 0700 05/25/19 0745 05/25/19 0752 05/25/19 0800  BP: (!) 157/71 (!) 132/120  (!) 154/63  Pulse: 88 88  88  Resp: _0 Temp: 98.4 F (36.9 C) 98.4 F (36.9 C)  98.4 F (36.9 C)  TempSrc:      SpO2: 100% 100% 100% 100%  Weight:      Height:       Physical Exam General: sedated on vent Heart: RRR Lungs: soft coarse BS Abdomen: obese - mild dependent  edema Extremities: left BKA and RLE no sig edema Dialysis Access: left AVF + bruit   Additional Objective  Intake/Output Summary (Last 24 hours) at 05/25/2019 1200 Last data filed at 05/25/2019 0800 Gross per 24 hour  Intake 818.57 ml  Output 175 ml  Net 643.57 ml   Labs: Basic Metabolic Panel: Recent Labs  Lab 05/23/19 1515 05/24/19 0342 05/24/19 1819 05/25/19 0522  NA 136 135  --  139  K 3.7 4.1  --  4.6  CL 98 98  --  103  CO2 22 21*  --  19*  GLUCOSE 146* 181*  --  201*  BUN 73* 84*  --  94*  CREATININE 9.83* 10.37*  --  10.84*  CALCIUM 7.8* 7.7*  --  7.5*  PHOS 6.2* 7.7* 9.0* 9.5*   Liver Function Tests: Recent Labs  Lab 05/28/2019 2004  AST 26  ALT 11  ALKPHOS 51  BILITOT 1.1  PROT 6.3*  ALBUMIN 3.3*   No results for input(s): LIPASE, AMYLASE in the last 168 hours. CBC: Recent Labs  Lab 05/11/2019 2004 05/22/19 0225 05/23/19 0623 05/24/19 0342 05/25/19 0522  WBC 14.7* 14.9* 7.8 6.7 6.4  NEUTROABS 13.1*  --   --   --   --   HGB 9.9* 9.2* 9.2* 8.8* 8.2*  HCT 33.5* 29.4* 29.4* 28.2* 26.9*  MCV 80.1 75.6* 76.4* 77.3* 78.2*  PLT 234 215 171 195 155   Blood Culture    Component Value Date/Time   SDES BLOOD RIGHT FOREARM 05/22/2019 0026  SPECREQUEST  05/22/2019 0026    BOTTLES DRAWN AEROBIC AND ANAEROBIC Blood Culture adequate volume   CULT  05/22/2019 0026    NO GROWTH 3 DAYS Performed at [facility], [place] [address] [place], [place] [address]    REPTSTATUS PENDING 05/22/2019 0026    Cardiac Enzymes: Recent Labs  Lab 05/29/2019 2004  CKTOTAL 451*   CBG: Recent Labs  Lab 05/24/19 1538 05/24/19 1946 05/24/19 2316 05/25/19 0325 05/25/19 0801  GLUCAP 207* 164* 220* 212* 207*   Iron Studies: No results for input(s): IRON, TIBC, TRANSFERRIN, FERRITIN in the last 72 hours. Lab Results  Component Value Date   INR 1.2 05/04/2019   INR 1.1 03/09/2019   INR 1.03 01/07/2018   Studies/Results: DG Chest Port 1 View  Result Date:  05/25/2019 CLINICAL DATA:  Respiratory failure. EXAM: PORTABLE CHEST 1 VIEW COMPARISON:  05/24/2019 FINDINGS: The endotracheal tube is 3.2 cm above the carina. The NG tube is coursing down the esophagus and into the stomach. Left IJ central venous catheter tip is in the region of the brachiocephalic SVC junction. Borderline cardiac enlargement is stable. Persistent left basilar density, likely atelectasis. IMPRESSION: 1. Stable support apparatus. 2. Persistent left basilar density, atelectasis versus infiltrate. Electronically Signed   By: Marijo Sanes M.D.   On: 05/25/2019 07:57   DG CHEST PORT 1 VIEW  Result Date: 05/24/2019 CLINICAL DATA:  Respiratory failure. EXAM: PORTABLE CHEST 1 VIEW COMPARISON:  05/23/2019 FINDINGS: The endotracheal tube is 3 cm above the carina. The left IJ central venous catheter is stable. The NG tube is coursing down the esophagus and into the stomach. Stable mild cardiac enlargement. Streaky left basilar atelectasis but no definite infiltrates or effusions. IMPRESSION: 1. Stable support apparatus. 2. Streaky left basilar atelectasis but no infiltrates or effusions. Electronically Signed   By: Marijo Sanes M.D.   On: 05/24/2019 06:35   Medications: . sodium chloride 50 mL/hr at 05/25/19 0800  . cefTRIAXone (ROCEPHIN)  IV Stopped (05/24/19 1429)  . clevidipine 2 mg/hr (05/25/19 0800)  . feeding supplement (VITAL 1.5 CAL) 40 mL/hr at 05/25/19 0300  . levETIRAcetam Stopped (05/24/19 2319)  . sodium chloride     . aspirin  81 mg Per Tube Daily  . chlorhexidine gluconate (MEDLINE KIT)  15 mL Mouth Rinse BID  . Chlorhexidine Gluconate Cloth  6 each Topical Q0600  . clopidogrel  75 mg Per Tube Daily  . feeding supplement (PRO-STAT SUGAR FREE 64)  30 mL Per Tube TID  . gabapentin  100 mg Per Tube Daily  . heparin  5,000 Units Subcutaneous Q8H  . insulin aspart  0-6 Units Subcutaneous Q4H  . insulin detemir  10 Units Subcutaneous Daily  . labetalol  100 mg Per Tube BID   . latanoprost  1 drop Both Eyes QHS  . mouth rinse  15 mL Mouth Rinse 10 times per day  . pantoprazole sodium  40 mg Per Tube Daily  . sodium chloride flush  10-40 mL Intracatheter Q12H

## 2019-05-25 NOTE — Progress Notes (Signed)
Inpatient Diabetes Program Recommendations  AACE/ADA: New Consensus Statement on Inpatient Glycemic Control (2015)  Target Ranges:  Prepandial:   less than 140 mg/dL      Peak postprandial:   less than 180 mg/dL (1-2 hours)      Critically ill patients:  140 - 180 mg/dL   Lab Results  Component Value Date   GLUCAP 207 (H) 05/25/2019   HGBA1C 9.6 (H) 04/06/2019    Review of Glycemic Control Results for Lauren Warner, Lauren Warner (MRN [id number]) as of 05/25/2019 09:29  Ref. Range 05/24/2019 15:38 05/24/2019 19:46 05/24/2019 23:16 05/25/2019 03:25 05/25/2019 08:01  Glucose-Capillary Latest Ref Range: 70 - 99 mg/dL 207 (H) 164 (H) 220 (H) 212 (H) 207 (H)   Diabetes history: DM 2 Outpatient Diabetes medications: Novolog 3 units tid with meals Current orders for Inpatient glycemic control:  Levemir 10 units Novolog 0-6 units Q4 hours  BUN/Creat: 94/10.84 Vital 1.5 40 ml/hour  Inpatient Diabetes Program Recommendations:    Glucose trends elevated on Tube Feeds  Consider Novolog 2 units Q4 hours Tube Feed Coverage (Do not give if tube feeds are stopped or held)  Thanks,  Tama Headings RN, MSN, BC-ADM Inpatient Diabetes Coordinator Team Pager [phone] (8a-5p)

## 2019-05-25 NOTE — Progress Notes (Signed)
NAME:  Lauren Warner, MRN:  [id number], DOB:  June 11, 1965, LOS: 3 ADMISSION DATE:  05/07/2019, CONSULTATION DATE:  05/25/19 REFERRING MD:  , CHIEF COMPLAINT:  Seizures, respiratory failure   Brief History   53 y.o. F with PMH of ESRD on HD, CHF, CAD, PE, DM BIBEMS after being found seizing with hypothermia, right-sided lung infiltrate and DKA. She had missed dialysis had had a K of 7.7  Poor mental status- MRI with bilateral signal difects concerning for hypoxic ischemic changes vs PRES  Past Medical History   has a past medical history of Anemia, Anxiety, Asthma, CHF (congestive heart failure) ([place]), Coronary artery disease, Daily headache, Depression, ESRD (end stage renal disease) on dialysis ([place]), High cholesterol, History of blood transfusion (10/19/2017), Hyperkalemia (10/2017), Hypertension, On home oxygen therapy, Pneumonia, Pulmonary embolism ([place]) (2012), and Type 1 diabetes mellitus ([place]).   Significant Hospital Events   12/20 admit to ICU 12/21 transitioning off insulin gtt  Consults:  Nephrology Neurology  Procedures:  12/20 ETT >> [facility] 12/20 >>  Significant Diagnostic Tests:  12/20 initial head CT >> neg 12/20 CT Head>> rapidly worsening scan with new cytotoxic edema along bilateral cerebral convexities with infarcts 12/20 ECHO> -ECHO LVEF 60-65; grade II diastolic dysfunction. Mildly elevated RVSP 36.9  12/20 MRI brain> Signal abnormality in R cerebral hemisphere and bilateral basal ganglia. (Read as favored to reflect global hypoxic-ischemic injury with infarcts as the seen asymmetry and degree of restricted diffusion are atypical for PRES)  Chest x-ray 12/20  improved bilateral interstitial opacities TTE 12/23 ? EF 60-65%, grade II diastolic dysfunction, moderate left pleural effusion  Micro Data:  12/20 BCx2> GPC>>>coag neg staph  12/20 Sars-Cov-2>>negative  Antimicrobials:  Vancomycin 12/19-12/20 Cefepime 12/19 Ceftriaxone 12/20-12/22  Interim  history/subjective:  No events overnight. Unresponsive. No evidence of sepsis so abx discontinued. Remains vent dependent due to mental status. Tolerating CPAP  Objective   Blood pressure (!) 154/63, pulse 88, temperature 98.4 F (36.9 C), resp. rate 17, height 5\' 7"  (1.702 m), weight 88.1 kg, SpO2 100 %.    Vent Mode: CPAP;PSV FiO2 (%):  [40 %] 40 % Set Rate:  [18 bmp] 18 bmp Vt Set:  [480 mL] 480 mL PEEP:  [5 cmH20] 5 cmH20 Pressure Support:  [8 cmH20-10 cmH20] 8 cmH20 Plateau Pressure:  [14 cmH20-16 cmH20] 16 cmH20   Intake/Output Summary (Last 24 hours) at 05/25/2019 1116 Last data filed at 05/25/2019 0800 Gross per 24 hour  Intake 928.67 ml  Output 175 ml  Net 753.67 ml   Filed Weights   05/22/19 0210 05/22/19 0550 05/24/19 0416  Weight: 85 kg 85 kg 88.1 kg   Physical Exam: General: Chronically ill-appearing, unresponsive, on mechanical ventilation HENT: [place], AT, ETT in place, edematous tongue obstructing airway Eyes: EOMI, no scleral icterus Respiratory: Clear to auscultation bilaterally.  No crackles, wheezing or rales Cardiovascular: RRR, -M/R/G, no JVD GI: BS+, soft, nontender Extremities: R AKA Neuro: Unresponsive, no withdrawal to painful stimuli GU: Foley in place  Resolved Hospital Problem list   Hyperkalemia   Assessment & Plan:   Acute encephalopathy due to seizures, abnormal MRI with bilateral signal abnormalities favored to represent hypoxic-ischemic injury with infarcts.  Neurology also suspects significant degree PRES therefore suggests waiting 7days before full prognostication  -had similar seizure last admission, thought to be r/t metabolic derangement.  -unknown down-time, daughter lives out of state and had not spoken to her in almost a week P: -Appreciate Neurology input. Continue Keppra, seizure precautions  -SBP goal <  160 -Serial neuro exams   Acute respiratory failure requiring intubation -intubated for airway protection P Full vent  support - 8cc/kg. PS as tolerated however mental status precludes extubation -VAP bundle -Bronchial hygiene and RT/bronchodilator protocol -mental status will be ongoing barrier toward extubation, continue to attempt weaning support as able   ESRD  P: -appreciate nephrology assistance. -TTS HD  DKA secondary to Type 1 DM -pH 7.26, received calcium, insulin and 1 amp bicarb in the ED -suspect patient is not using insulin at home P: -Endotool for transition off of gtt now that b HB is normalized  -SSI  Possible Sepsis -?CAP -poorly visualized 59mm opacity vs nodule in the RUL More likely infiltrates related to volume but cannot rule out aspiration, also note hypothermia on admit -Covid and flu negative  -Complete 5 days for antibiotics for emperic CAP coverage  Coag Neg Staph P: -Discontinued Vanc  Hypertensive urgency History of CAD, CHF, HTN -Trop minimally elevated and flat -ECHO LVEF 60-65; grade II diastolic dysfunction. Mildly elevated RVSP 36.9  P: -continue plavix and Asa -Wean cleviprex for SBP <160 -Continue labetalol and attempt wean cleviprex   Inadequate PO intake P -continue TF   Best practice:  Diet: EN per RDN  Pain/Anxiety/Delirium protocol (if indicated):  Fentanyl prn VAP protocol (if indicated): yes DVT prophylaxis: heparin, SCD GI prophylaxis: PPI Glucose control: SSI Mobility: bed rest Code Status: full Family Communication: Updated daughter 12/23 Disposition: ICU  CRITICAL CARE Performed by: Margaretha Seeds  The patient is critically ill with multiple organ systems failure and requires high complexity decision making for assessment and support, frequent evaluation and titration of therapies, application of advanced monitoring technologies and extensive interpretation of multiple databases.   Critical Care Time devoted to patient care services described in this note is 35 Minutes.   Rodman Pickle, M.D. [facility] Pulmonary/Critical Care  Medicine 05/25/2019 11:16 AM   Please see [place] for pager number to reach on-call Pulmonary and Critical Care Team.

## 2019-05-26 ENCOUNTER — Other Ambulatory Visit: Payer: Self-pay

## 2019-05-26 DIAGNOSIS — Z9911 Dependence on respirator [ventilator] status: Secondary | ICD-10-CM

## 2019-05-26 DIAGNOSIS — I6783 Posterior reversible encephalopathy syndrome: Secondary | ICD-10-CM

## 2019-05-26 LAB — GLUCOSE, CAPILLARY
Glucose-Capillary: 161 mg/dL — ABNORMAL HIGH (ref 70–99)
Glucose-Capillary: 176 mg/dL — ABNORMAL HIGH (ref 70–99)
Glucose-Capillary: 178 mg/dL — ABNORMAL HIGH (ref 70–99)
Glucose-Capillary: 181 mg/dL — ABNORMAL HIGH (ref 70–99)
Glucose-Capillary: 193 mg/dL — ABNORMAL HIGH (ref 70–99)
Glucose-Capillary: 210 mg/dL — ABNORMAL HIGH (ref 70–99)
Glucose-Capillary: 219 mg/dL — ABNORMAL HIGH (ref 70–99)

## 2019-05-26 LAB — BASIC METABOLIC PANEL
Anion gap: 15 (ref 5–15)
BUN: 39 mg/dL — ABNORMAL HIGH (ref 6–20)
CO2: 25 mmol/L (ref 22–32)
Calcium: 8.3 mg/dL — ABNORMAL LOW (ref 8.9–10.3)
Chloride: 98 mmol/L (ref 98–111)
Creatinine, Ser: 5.28 mg/dL — ABNORMAL HIGH (ref 0.44–1.00)
GFR calc Af Amer: 10 mL/min — ABNORMAL LOW (ref >60)
GFR calc non Af Amer: 9 mL/min — ABNORMAL LOW (ref >60)
Glucose, Bld: 171 mg/dL — ABNORMAL HIGH (ref 70–99)
Potassium: 3.4 mmol/L — ABNORMAL LOW (ref 3.5–5.1)
Sodium: 138 mmol/L (ref 135–145)

## 2019-05-26 LAB — CBC
HCT: 28.5 % — ABNORMAL LOW (ref 36.0–46.0)
Hemoglobin: 8.9 g/dL — ABNORMAL LOW (ref 12.0–15.0)
MCH: 23.7 pg — ABNORMAL LOW (ref 26.0–34.0)
MCHC: 31.2 g/dL (ref 30.0–36.0)
MCV: 75.8 fL — ABNORMAL LOW (ref 80.0–100.0)
Platelets: 187 10*3/uL (ref 150–400)
RBC: 3.76 MIL/uL — ABNORMAL LOW (ref 3.87–5.11)
RDW: 19.2 % — ABNORMAL HIGH (ref 11.5–15.5)
WBC: 5.9 10*3/uL (ref 4.0–10.5)
nRBC: 0 % (ref 0.0–0.2)

## 2019-05-26 LAB — TRIGLYCERIDES: Triglycerides: 129 mg/dL (ref <150)

## 2019-05-26 MED ORDER — HYDRALAZINE HCL 20 MG/ML IJ SOLN
10.0000 mg | Freq: Four times a day (QID) | INTRAMUSCULAR | Status: DC | PRN
  Administered 2019-05-26 – 2019-05-30 (×3): 10 mg via INTRAVENOUS
  Filled 2019-05-26 (×5): qty 1

## 2019-05-26 MED ORDER — CHLORHEXIDINE GLUCONATE CLOTH 2 % EX PADS
6.0000 | MEDICATED_PAD | Freq: Every day | CUTANEOUS | Status: DC
  Administered 2019-05-27 – 2019-05-31 (×3): 6 via TOPICAL

## 2019-05-26 MED ORDER — LEVETIRACETAM 100 MG/ML PO SOLN
500.0000 mg | Freq: Two times a day (BID) | ORAL | Status: DC
  Administered 2019-05-26 – 2019-05-31 (×11): 500 mg
  Filled 2019-05-26 (×12): qty 5

## 2019-05-26 NOTE — Progress Notes (Signed)
[facility] Progress Note   Dialysis Orders: [facility]  4h 84kg 2/2 L AVF Heparin none  - calc 0.25 three times per week  Assessment/Plan: 1.AMS/Seizure/metabolic encephalopathy- EEG shows no epileptiform activity noted, general background slowing noted which could be from multiple etiologies. CT suggesting PRES vs anoxic injury vs watershed infarcts. MRI examination of the brain12/20Extensive signal abnormality throughout the right cerebral hemisphere and bilateral basal ganglia with less pronounced left cerebral and posterior fossa involvement, favored to reflect global hypoxic-ischemic injury with infarcts.  Appreciate assistance from Dr. Kirkpatrick will continue Keppra. 2. Hyperkalemia 2/2 non compliance with HD- improved post HD. 3.Acute Resp Failure w/Hypoxia - intubated on mechanical ventilation 4. ESRD- on HD TTS (off schedule)   running 3.5 hr due to high inpatient HD census - next HD Saturday per holiday schedule - then can get back on schedule next week ok to d/c foley 5. Anemiaof CKD-Hgb 9.2> 8.2 > 8.9  . ESA recently dosed, not due until 12/26 6. Secondary hyperparathyroidism- Ca at goal. Hold calcitriol due to being sedated/intubated. P 9.5  7.HTN/volume- Pulmonary edema improved on repeat CXR.   Will continue to challenge dry weight net UF 2.5 12/23 8.  Sepsis with 1/2 + coag neg staph 9. Nutrition - TF vital1.5 at 40/hr  Alb low 10. Disp - likely will need trach - possible LTAC admission - needs palliative care consult    B , PA-C  [facility] [phone] 05/26/2019,10:00 AM  LOS: 4 days   Subjective:  Last HD 12/22 -  Sedated on Vent with TF   Objective Vitals:   05/26/19 0430 05/26/19 0500 05/26/19 0550 05/26/19 0759  BP: (!) 153/73 (!) 162/74 (!) 162/70   Pulse: 96 96    Resp:      Temp:      TempSrc:      SpO2:    100%  Weight:   90.5 kg   Height:       Physical Exam General:  sedated on vent Heart: RRR Lungs: soft coarse BS bilaterally Abdomen: obese - mild dependent edema Extremities: left BKA and RLE no sig edema Dialysis Access: left AVF + bruit   Additional Objective  Intake/Output Summary (Last 24 hours) at 05/26/2019 1000 Last data filed at 05/26/2019 0800 Gross per 24 hour  Intake 3636.53 ml  Output 2593 ml  Net 1043.53 ml   Labs: Basic Metabolic Panel: Recent Labs  Lab 05/24/19 0342 05/24/19 1819 05/25/19 0522 05/26/19 0548  NA 135  --  139 138  K 4.1  --  4.6 3.4*  CL 98  --  103 98  CO2 21*  --  19* 25  GLUCOSE 181*  --  201* 171*  BUN 84*  --  94* 39*  CREATININE 10.37*  --  10.84* 5.28*  CALCIUM 7.7*  --  7.5* 8.3*  PHOS 7.7* 9.0* 9.5*  --    Liver Function Tests: Recent Labs  Lab 05/10/2019 2004  AST 26  ALT 11  ALKPHOS 51  BILITOT 1.1  PROT 6.3*  ALBUMIN 3.3*   No results for input(s): LIPASE, AMYLASE in the last 168 hours. CBC: Recent Labs  Lab 05/26/2019 2004 05/22/19 0225 05/23/19 0623 05/24/19 0342 05/25/19 0522 05/26/19 0548  WBC 14.7* 14.9* 7.8 6.7 6.4 5.9  NEUTROABS 13.1*  --   --   --   --   --   HGB 9.9* 9.2* 9.2* 8.8* 8.2* 8.9*  HCT 33.5* 29.4* 29.4* 28.2* 26.9* 28.5*    MCV 80.1 75.6* 76.4* 77.3* 78.2* 75.8*  PLT 234 215 171 195 155 187   Blood Culture    Component Value Date/Time   SDES BLOOD RIGHT FOREARM 05/22/2019 0026   SPECREQUEST  05/22/2019 0026    BOTTLES DRAWN AEROBIC AND ANAEROBIC Blood Culture adequate volume   CULT  05/22/2019 0026    NO GROWTH 4 DAYS Performed at [facility], [address] [place], [place] [address]    REPTSTATUS PENDING 05/22/2019 0026    Cardiac Enzymes: Recent Labs  Lab 05/30/2019 2004  CKTOTAL 451*   CBG: Recent Labs  Lab 05/25/19 1544 05/25/19 1945 05/26/19 0114 05/26/19 0352 05/26/19 0726  GLUCAP 226* 207* 219* 176* 210*   Iron Studies: No results for input(s): IRON, TIBC, TRANSFERRIN, FERRITIN in the last 72 hours. Lab Results   Component Value Date   INR 1.2 05/30/2019   INR 1.1 03/09/2019   INR 1.03 01/07/2018   Studies/Results: DG Chest Port 1 View  Result Date: 05/25/2019 CLINICAL DATA:  Respiratory failure. EXAM: PORTABLE CHEST 1 VIEW COMPARISON:  05/24/2019 FINDINGS: The endotracheal tube is 3.2 cm above the carina. The NG tube is coursing down the esophagus and into the stomach. Left IJ central venous catheter tip is in the region of the brachiocephalic SVC junction. Borderline cardiac enlargement is stable. Persistent left basilar density, likely atelectasis. IMPRESSION: 1. Stable support apparatus. 2. Persistent left basilar density, atelectasis versus infiltrate. Electronically Signed   By: P.  Gallerani M.D.   On: 05/25/2019 07:57   Medications: . sodium chloride 50 mL/hr at 05/26/19 0400  . clevidipine 2 mg/hr (05/26/19 0400)  . feeding supplement (VITAL 1.5 CAL) 1,000 mL (05/26/19 0959)  . sodium chloride     . aspirin  81 mg Per Tube Daily  . chlorhexidine gluconate (MEDLINE KIT)  15 mL Mouth Rinse BID  . Chlorhexidine Gluconate Cloth  6 each Topical Q0600  . Chlorhexidine Gluconate Cloth  6 each Topical Q0600  . clopidogrel  75 mg Per Tube Daily  . feeding supplement (PRO-STAT SUGAR FREE 64)  30 mL Per Tube TID  . gabapentin  100 mg Per Tube Daily  . heparin  5,000 Units Subcutaneous Q8H  . insulin aspart  0-6 Units Subcutaneous Q4H  . insulin detemir  10 Units Subcutaneous Daily  . labetalol  100 mg Per Tube BID  . latanoprost  1 drop Both Eyes QHS  . levETIRAcetam  500 mg Per Tube BID  . mouth rinse  15 mL Mouth Rinse 10 times per day  . pantoprazole sodium  40 mg Per Tube Daily  . sodium chloride flush  10-40 mL Intracatheter Q12H              

## 2019-05-26 NOTE — Progress Notes (Signed)
NAME:  Lauren Warner, MRN:  [id number], DOB:  [birth year], LOS: 4 ADMISSION DATE:  05/22/2019, CONSULTATION DATE:  05/26/19 REFERRING MD:  , CHIEF COMPLAINT:  Seizures, respiratory failure   Brief History   53 y.o. F with PMH of ESRD on HD, CHF, CAD, PE, DM BIBEMS after being found seizing with hypothermia, right-sided lung infiltrate and DKA. She had missed dialysis had had a K of 7.7  Poor mental status- MRI with bilateral signal difects concerning for hypoxic ischemic changes vs PRES  Past Medical History   has a past medical history of Anemia, Anxiety, Asthma, CHF (congestive heart failure) ([place]), Coronary artery disease, Daily headache, Depression, ESRD (end stage renal disease) on dialysis ([place]), High cholesterol, History of blood transfusion (10/19/2017), Hyperkalemia (10/2017), Hypertension, On home oxygen therapy, Pneumonia, Pulmonary embolism ([place]) (2012), and Type 1 diabetes mellitus ([place]).   Significant Hospital Events   12/20 admit to ICU 12/21 transitioning off insulin gtt 12/22-12/24 Remain on ventilator support while awaiting mental status changes  Consults:  Nephrology Neurology  Procedures:  12/20 ETT >> [facility] 12/20 >>  Significant Diagnostic Tests:  12/20 initial head CT >> neg 12/20 CT Head>> rapidly worsening scan with new cytotoxic edema along bilateral cerebral convexities with infarcts 12/20 ECHO> -ECHO LVEF 60-65; grade II diastolic dysfunction. Mildly elevated RVSP 36.9  12/20 MRI brain> Signal abnormality in R cerebral hemisphere and bilateral basal ganglia. (Read as favored to reflect global hypoxic-ischemic injury with infarcts as the seen asymmetry and degree of restricted diffusion are atypical for PRES)  Chest x-ray 12/20  improved bilateral interstitial opacities TTE 12/23 ? EF 60-65%, grade II diastolic dysfunction, moderate left pleural effusion  Micro Data:  12/20 BCx2> GPC>>>coag neg staph  12/20 Sars-Cov-2>>negative  Antimicrobials:    Vancomycin 12/19-12/20 Cefepime 12/19 Ceftriaxone 12/20-12/22  Interim history/subjective:  No events overnight. Remains vent dependent. Neurology reports improved exam with withdrawal.  Objective   Blood pressure (!) 142/67, pulse 88, temperature 98.5 F (36.9 C), temperature source Oral, resp. rate 19, height 5\' 7"  (1.702 m), weight 90.5 kg, SpO2 100 %.    Vent Mode: CPAP;PSV FiO2 (%):  [40 %] 40 % Set Rate:  [18 bmp] 18 bmp Vt Set:  [480 mL] 480 mL PEEP:  [5 cmH20] 5 cmH20 Pressure Support:  [5 cmH20-8 cmH20] 5 cmH20 Plateau Pressure:  [13 cmH20-19 cmH20] 13 cmH20   Intake/Output Summary (Last 24 hours) at 05/26/2019 1217 Last data filed at 05/26/2019 1200 Gross per 24 hour  Intake 4435.95 ml  Output 2598 ml  Net 1837.95 ml   Filed Weights   05/24/19 0416 05/26/19 0220 05/26/19 0550  Weight: 88.1 kg 93 kg 90.5 kg   Physical Exam: General: Chronically ill-appearing, unresponsive, on mechanical ventilation HENT: [place], AT, ETT in place, edematous tongue obstructing airway Eyes: EOMI, no scleral icterus Respiratory: Clear to auscultation bilaterally.  No crackles, wheezing or rales Cardiovascular: RRR, -M/R/G, no JVD GI: BS+, soft, nontender Extremities:-Edema,-tenderness Neuro: On my exam, patient unresponsive, no withdrawal to pain, no witnessed movements x 4  Resolved Hospital Problem list   Hyperkalemia   Assessment & Plan:   Acute encephalopathy due to seizures, abnormal MRI with bilateral signal abnormalities favored to represent hypoxic-ischemic injury with infarcts.   -Neurology suspects significant degree PRES with signs of improvement on their exam today -unknown down-time, daughter lives out of state and had not spoken to her in almost a week P: -Appreciate Neurology input. Continue Keppra and seizure precautions. -On Cleviprex for goral SBP goal <  160 -Serial neuro exams   Acute respiratory failure requiring intubation -intubated for airway  protection P -Remain on full vent support. -PS as tolerated however mental status precludes extubation -VAP bundle -Bronchial hygiene and RT/bronchodilator protocol -mental status will be ongoing barrier toward extubation, continue to attempt weaning support as able   ESRD  P: -appreciate nephrology assistance. -TTS HD  DKA secondary to Type 1 DM -pH 7.26, received calcium, insulin and 1 amp bicarb in the ED -suspect patient is not using insulin at home P: -SSI  Possible Sepsis -?CAP -poorly visualized 63mm opacity vs nodule in the RUL More likely infiltrates related to volume but cannot rule out aspiration, also note hypothermia on admit -Covid and flu negative  -Complete 5 days for antibiotics for emperic CAP coverage  Coag Neg Staph P: -Discontinued Vanc  Hypertensive urgency History of CAD, CHF, HTN -Trop minimally elevated and flat -ECHO LVEF 60-65; grade II diastolic dysfunction. Mildly elevated RVSP 36.9  P: -continue plavix and Asa -Wean cleviprex for SBP <160 -Continue labetalol and attempt wean cleviprex   Inadequate PO intake P -continue TF   Best practice:  Diet: EN per RDN  Pain/Anxiety/Delirium protocol (if indicated):  Fentanyl prn VAP protocol (if indicated): yes DVT prophylaxis: heparin, SCD GI prophylaxis: PPI Glucose control: SSI Mobility: bed rest Code Status: full Family Communication: Will update daughter Disposition: ICU  CRITICAL CARE Performed by: Margaretha Seeds  The patient is critically ill with multiple organ systems failure and requires high complexity decision making for assessment and support, frequent evaluation and titration of therapies, application of advanced monitoring technologies and extensive interpretation of multiple databases.   Critical Care Time devoted to patient care services described in this note is 33 Minutes.   Rodman Pickle, M.D. [facility] Pulmonary/Critical Care Medicine 05/26/2019 12:17 PM   Please  see Amion for pager number to reach on-call Pulmonary and Critical Care Team.

## 2019-05-26 NOTE — Progress Notes (Signed)
Subjective: Possibly some slight improvement  Exam: Vitals:   05/26/19 0550 05/26/19 0759  BP: (!) 162/70   Pulse:    Resp:    Temp:    SpO2:  100%   Gen: In bed, intubated Resp: Ventilated Abd: soft, nt  Neuro: MS: Eyes open, the patient does not engage the examiner or fixate and track CN: Pupils are reactive bilaterally, corneals are intact, doll's eye intact Motor: She withdraws to noxious stimulation bilaterally Sensory: As above   Impression: 53 year old female found down with seizure, MRI compatible with severe pres versus hypoxic/ischemic encephalopathy.  She is making progress, and therefore my suspicion is that this is more leaning towards PRES. with the degree of diffusion change, I am still concerned that she may have significant infarct and therefore her prognosis is still uncertain, but with 5 that she is being progress I cannot exclude a possible good outcome either.  Recommendations: 1) continue Keppra 500 twice daily 2) continue supportive care  Roland Rack, MD [facility] [phone]  If 7pm- 7am, please page neurology on call as listed in [place].

## 2019-05-27 LAB — BASIC METABOLIC PANEL
Anion gap: 14 (ref 5–15)
BUN: 60 mg/dL — ABNORMAL HIGH (ref 6–20)
CO2: 24 mmol/L (ref 22–32)
Calcium: 8.3 mg/dL — ABNORMAL LOW (ref 8.9–10.3)
Chloride: 103 mmol/L (ref 98–111)
Creatinine, Ser: 7.28 mg/dL — ABNORMAL HIGH (ref 0.44–1.00)
GFR calc Af Amer: 7 mL/min — ABNORMAL LOW (ref >60)
GFR calc non Af Amer: 6 mL/min — ABNORMAL LOW (ref >60)
Glucose, Bld: 81 mg/dL (ref 70–99)
Potassium: 4.6 mmol/L (ref 3.5–5.1)
Sodium: 141 mmol/L (ref 135–145)

## 2019-05-27 LAB — GLUCOSE, CAPILLARY
Glucose-Capillary: 114 mg/dL — ABNORMAL HIGH (ref 70–99)
Glucose-Capillary: 197 mg/dL — ABNORMAL HIGH (ref 70–99)
Glucose-Capillary: 217 mg/dL — ABNORMAL HIGH (ref 70–99)
Glucose-Capillary: 217 mg/dL — ABNORMAL HIGH (ref 70–99)
Glucose-Capillary: 226 mg/dL — ABNORMAL HIGH (ref 70–99)
Glucose-Capillary: 87 mg/dL (ref 70–99)

## 2019-05-27 LAB — CULTURE, BLOOD (ROUTINE X 2)
Culture: NO GROWTH
Special Requests: ADEQUATE

## 2019-05-27 LAB — CBC
HCT: 27.5 % — ABNORMAL LOW (ref 36.0–46.0)
Hemoglobin: 8.2 g/dL — ABNORMAL LOW (ref 12.0–15.0)
MCH: 23.8 pg — ABNORMAL LOW (ref 26.0–34.0)
MCHC: 29.8 g/dL — ABNORMAL LOW (ref 30.0–36.0)
MCV: 79.7 fL — ABNORMAL LOW (ref 80.0–100.0)
Platelets: 203 10*3/uL (ref 150–400)
RBC: 3.45 MIL/uL — ABNORMAL LOW (ref 3.87–5.11)
RDW: 19.7 % — ABNORMAL HIGH (ref 11.5–15.5)
WBC: 6.4 10*3/uL (ref 4.0–10.5)
nRBC: 0 % (ref 0.0–0.2)

## 2019-05-27 MED ORDER — HYDRALAZINE HCL 25 MG PO TABS
25.0000 mg | ORAL_TABLET | Freq: Three times a day (TID) | ORAL | Status: DC
  Administered 2019-05-27 – 2019-05-31 (×12): 25 mg
  Filled 2019-05-27 (×11): qty 1

## 2019-05-27 MED ORDER — "THROMBI-PAD 3""X3"" EX PADS"
1.0000 | MEDICATED_PAD | Freq: Once | CUTANEOUS | Status: DC
  Filled 2019-05-27: qty 1

## 2019-05-27 MED ORDER — LABETALOL HCL 100 MG PO TABS
200.0000 mg | ORAL_TABLET | Freq: Two times a day (BID) | ORAL | Status: DC
  Administered 2019-05-27 – 2019-05-31 (×8): 200 mg
  Filled 2019-05-27 (×10): qty 2

## 2019-05-27 MED ORDER — HYDRALAZINE HCL 25 MG PO TABS
25.0000 mg | ORAL_TABLET | Freq: Three times a day (TID) | ORAL | Status: DC
  Filled 2019-05-27: qty 1

## 2019-05-27 NOTE — Plan of Care (Signed)
  Problem: Clinical Measurements: Goal: Respiratory complications will improve 05/27/2019 1831 by Charise Carwin, RN Outcome: Progressing Pt weaned all shift   Problem: Nutrition: Goal: Adequate nutrition will be maintained 05/27/2019 1831 by Charise Carwin, RN Outcome: Progressing  Problem: Elimination: Goal: Will not experience complications related to bowel motility 05/27/2019 1831 by Charise Carwin, RN Outcome: Progressing    Problem: Skin Integrity: Goal: Risk for impaired skin integrity will decrease 05/27/2019 1831 by Charise Carwin, RN Note: Pt has skin tear on sacral/ lower back area. Protected by foam. Proper skin care and q2hr turns performed.

## 2019-05-27 NOTE — Progress Notes (Signed)
NAME:  Lauren Warner, MRN:  [id number], DOB:  06-16-1965, LOS: 5 ADMISSION DATE:  05/20/2019, CONSULTATION DATE:  05/27/19 REFERRING MD:  , CHIEF COMPLAINT:  Seizures, respiratory failure   Brief History   53 y.o. F with PMH of ESRD on HD, CHF, CAD, PE, DM BIBEMS after being found seizing with hypothermia, right-sided lung infiltrate and DKA. She had missed dialysis had had a K of 7.7  Poor mental status- MRI with bilateral signal difects concerning for hypoxic ischemic changes vs PRES  Past Medical History   has a past medical history of Anemia, Anxiety, Asthma, CHF (congestive heart failure) ([place]), Coronary artery disease, Daily headache, Depression, ESRD (end stage renal disease) on dialysis ([place]), High cholesterol, History of blood transfusion (10/19/2017), Hyperkalemia (10/2017), Hypertension, On home oxygen therapy, Pneumonia, Pulmonary embolism ([place]) (2012), and Type 1 diabetes mellitus ([place]).   Significant Hospital Events   12/20 admit to ICU 12/21 transitioning off insulin gtt 12/22-12/24 Remain on ventilator support while awaiting mental status changes 12/25 Code status updated to DNR per daughter  Consults:  Nephrology Neurology  Procedures:  12/20 ETT >> [facility] 12/20 >>  Significant Diagnostic Tests:  12/20 initial head CT >> neg 12/20 CT Head>> rapidly worsening scan with new cytotoxic edema along bilateral cerebral convexities with infarcts 12/20 ECHO> -ECHO LVEF 60-65; grade II diastolic dysfunction. Mildly elevated RVSP 36.9  12/20 MRI brain> Signal abnormality in R cerebral hemisphere and bilateral basal ganglia. (Read as favored to reflect global hypoxic-ischemic injury with infarcts as the seen asymmetry and degree of restricted diffusion are atypical for PRES)  Chest x-ray 12/20  improved bilateral interstitial opacities TTE 12/23 ? EF 60-65%, grade II diastolic dysfunction, moderate left pleural effusion  Micro Data:  12/20 BCx2> GPC>>>coag neg staph  12/20  Sars-Cov-2>>negative  Antimicrobials:  Vancomycin 12/19-12/20 Cefepime 12/19 Ceftriaxone 12/20-12/22  Interim history/subjective:  No events overnight. Remains vent-dependent. Updated daughter and discussed goals of care. Daughter does not wish for patient to receive CPR in her current condition.  Objective   Blood pressure (!) 178/79, pulse 95, temperature 98 F (36.7 C), temperature source Axillary, resp. rate 16, height 5\' 7"  (1.702 m), weight 86 kg, SpO2 100 %.    Vent Mode: CPAP;PSV FiO2 (%):  [40 %] 40 % Set Rate:  [18 bmp] 18 bmp Vt Set:  [480 mL] 480 mL PEEP:  [5 cmH20] 5 cmH20 Pressure Support:  [5 cmH20-15 cmH20] 10 cmH20 Plateau Pressure:  [16 cmH20-18 cmH20] 18 cmH20   Intake/Output Summary (Last 24 hours) at 05/27/2019 0956 Last data filed at 05/27/2019 0900 Gross per 24 hour  Intake 1570.98 ml  Output 5 ml  Net 1565.98 ml   Filed Weights   05/26/19 0220 05/26/19 0550 05/27/19 0441  Weight: 93 kg 90.5 kg 86 kg   Physical Exam: General: Chronically ill-appearing, on mechanical ventilation HENT: [place], AT, ETT in place, improved tongue edema  Eyes: EOMI, no scleral icterus Respiratory: Clear to auscultation bilaterally.  No crackles, wheezing or rales Cardiovascular: RRR, -M/R/G, no JVD GI: BS+, soft, nontender Extremities:L BKA Neuro: Does not open eyes, withdraws to pain x 3 extremities, does not follow commands GU: Foley in place   Resolved Hospital Problem list   Hyperkalemia   Assessment & Plan:  53 year old female with ESRD admitted for seizure with MRI findings concerning for PRES vs hypoxemic/ischemic injury. Neuro exams improving however remains vent-dependent due to mental status. After discussion with daughter on 12/25, code status changed to DNR. Daughter wishes  to minimize discomfort patient is having and would consider comfort care/hospice if patient does not demonstrate significant neurologic recovery. Discussed plan with Neurology and we will  plan to re-discuss goals of care again after ~one week from patient's initial insult.  Acute encephalopathy due to seizures, abnormal MRI with bilateral signal abnormalities favored to represent hypoxic-ischemic injury with infarcts.   -Neurology suspects significant degree PRES with signs of improvement on their exam today -unknown down-time, daughter lives out of state and had not spoken to her in almost a week P: -Appreciate Neurology input. Continue Keppra and seizure precautions. -Wean off Cleviprex -Start scheduled hydralazine. Continue lopressor. -Goal SBP <160 -Serial neuro exams   Acute respiratory failure requiring intubation -intubated for airway protection P -Continue full vent support -PS as tolerated however mental status precludes extubation -VAP bundle -Bronchial hygiene and RT/bronchodilator protocol -mental status will be ongoing barrier toward extubation, continue to attempt weaning support as able   ESRD  P: -appreciate nephrology assistance. -TTS HD  DKA secondary to Type 1 DM -pH 7.26, received calcium, insulin and 1 amp bicarb in the ED -suspect patient is not using insulin at home P: -SSI  Possible Sepsis -?CAP -poorly visualized 35mm opacity vs nodule in the RUL More likely infiltrates related to volume but cannot rule out aspiration, also note hypothermia on admit -Covid and flu negative  -Complete 5 days for antibiotics for emperic CAP coverage  Coag Neg Staph P: -Discontinued Vanc  Hypertensive urgency History of CAD, CHF, HTN -Trop minimally elevated and flat -ECHO LVEF 60-65; grade II diastolic dysfunction. Mildly elevated RVSP 36.9  P: -continue plavix and Asa -Wean Cleviprex gtt for SBP goal <160 -Start scheduled hydralazine. Continue lopressor.  Inadequate PO intake P -continue TF   Best practice:  Diet: EN per RDN  Pain/Anxiety/Delirium protocol (if indicated):  Fentanyl prn VAP protocol (if indicated): yes DVT prophylaxis:  heparin, SCD GI prophylaxis: PPI Glucose control: SSI Mobility: bed rest Code Status: DNR Family Communication: Updated daughter on 12/25. Disposition: ICU  CRITICAL CARE Performed by: Margaretha Seeds  The patient is critically ill with multiple organ systems failure and requires high complexity decision making for assessment and support, frequent evaluation and titration of therapies, application of advanced monitoring technologies and extensive interpretation of multiple databases.   Critical Care Time devoted to patient care services described in this note is 34 Minutes.  Rodman Pickle, M.D. [facility] Pulmonary/Critical Care Medicine 05/27/2019 10:42 AM   Please see Amion for pager number to reach on-call Pulmonary and Critical Care Team.

## 2019-05-27 NOTE — Progress Notes (Addendum)
Patient ID: Lauren Warner, female   DOB: [birth year], 53 y.o.   MRN: [id number]  [facility] Progress Note   Assessment/ Plan:   1. Altered mental status with seizure and MRI findings of PRES versus hypoxic/ischemic encephalopathy: Ongoing close neurological surveillance to help determine overall outlook/prognosis.  Without reported improvement overnight. 2. ESRD: Continue hemodialysis on a Tuesday/Thursday/Saturday schedule with next hemodialysis due tomorrow versus Sunday.  Overall prognosis appears grim and trajectory possibly heading towards LTAC versus palliative care based on reassessment. 3. Anemia: Without overt loss, continue ESA and monitoring hemoglobin/hematocrit. 4. CKD-MBD: Elevated phosphorus level noted, currently n.p.o./without binders.  Hold calcitriol at this time. 5. Nutrition: On tube feeds, phosphorus binder at hold.  6. Hypertension: Blood pressures intermittently elevated, I will increase labetalol to 200 mg twice a day.  Subjective:   No acute events overnight, she withdraws to pain but without response to verbal stimuli.   Objective:   BP (!) 178/79 Comment: PRN given  Pulse 95   Temp 98 F (36.7 C) (Axillary)   Resp 16   Ht 5' 7" (1.702 m)   Wt 86 kg   SpO2 100%   BMI 29.69 kg/m   Physical Exam: Gen: Intubated, unresponsive CVS: Pulse regular rhythm, S1 and S2 normal. Resp: Clear to auscultation, no rales/rhonchi Abd: Soft, obese, nontender Ext: Status post left BKA, no right lower extremity edema.  Left upper arm AVF pulsatile with blood stained dressing.  Labs: BMET Recent Labs  Lab 05/23/19 0104 05/23/19 0623 05/23/19 1515 05/24/19 0342 05/24/19 1819 05/25/19 0522 05/26/19 0548 05/27/19 0500  NA 138 137 136 135  --  139 138 141  K 3.8 3.8 3.7 4.1  --  4.6 3.4* 4.6  CL 98 97* 98 98  --  103 98 103  CO2 21* 21* 22 21*  --  19* 25 24  GLUCOSE 166* 128* 146* 181*  --  201* 171* 81  BUN 72* 74* 73* 84*  --  94* 39* 60*   CREATININE 9.46* 9.46* 9.83* 10.37*  --  10.84* 5.28* 7.28*  CALCIUM 8.3* 8.3* 7.8* 7.7*  --  7.5* 8.3* 8.3*  PHOS  --   --  6.2* 7.7* 9.0* 9.5*  --   --    CBC Recent Labs  Lab 05/12/2019 2004 05/05/2019 2015 05/24/19 0342 05/25/19 0522 05/26/19 0548 05/27/19 0500  WBC 14.7*   < > 6.7 6.4 5.9 6.4  NEUTROABS 13.1*  --   --   --   --   --   HGB 9.9*  --  8.8* 8.2* 8.9* 8.2*  HCT 33.5*  --  28.2* 26.9* 28.5* 27.5*  MCV 80.1   < > 77.3* 78.2* 75.8* 79.7*  PLT 234   < > 195 155 187 203   < > = values in this interval not displayed.      Medications:    . aspirin  81 mg Per Tube Daily  . chlorhexidine gluconate (MEDLINE KIT)  15 mL Mouth Rinse BID  . Chlorhexidine Gluconate Cloth  6 each Topical Q0600  . clopidogrel  75 mg Per Tube Daily  . feeding supplement (PRO-STAT SUGAR FREE 64)  30 mL Per Tube TID  . gabapentin  100 mg Per Tube Daily  . heparin  5,000 Units Subcutaneous Q8H  . insulin aspart  0-6 Units Subcutaneous Q4H  . insulin detemir  10 Units Subcutaneous Daily  . labetalol  100 mg Per Tube BID  . latanoprost  1 drop Both  Eyes QHS  . levETIRAcetam  500 mg Per Tube BID  . mouth rinse  15 mL Mouth Rinse 10 times per day  . pantoprazole sodium  40 mg Per Tube Daily  . sodium chloride flush  10-40 mL Intracatheter Q12H  Elmarie Shiley, MD 05/27/2019, 9:59 AM

## 2019-05-27 NOTE — Progress Notes (Signed)
Paged MD Moshe Cipro with nephro about slow oozing of left upper arm graft dressing. No new orders. Changed left upper arm graft dressing per MD [place]'s recommendations with MD Rodman Pickle at bedside. Slight ooze noted at site. No new drainage noted the rest of the shift.

## 2019-05-27 NOTE — Progress Notes (Signed)
Subjective: Still encephalopathic  Exam: Vitals:   05/27/19 1400 05/27/19 1430  BP: 140/72 (!) 157/69  Pulse: 91 91  Resp: 20 18  Temp:    SpO2: 100% 100%   Gen: In bed, intubated Resp: Ventilated Abd: soft, nt  Neuro: MS: Eyes open, the patient does not engage the examiner or fixate and track CN: Pupils are reactive bilaterally, corneals are intact, doll's eye intact Motor: She withdraws to noxious stimulation bilaterally Sensory: As above   Impression: 53 year old female found down with seizure, MRI compatible with severe pres versus hypoxic/ischemic encephalopathy.  She is making some slight progress, and therefore my suspicion is that this is more leaning towards PRES. with the degree of diffusion change, I am still concerned that she may have significant infarct and therefore her prognosis is still uncertain, but with the fact that she is being progress I cannot exclude her regaining conciousness either.   I discussed with Dr. Loanne Drilling, she does have significant other comorbidities as well. If she does not continue making progress, it may be reasonable to discuss goals of care with family. I would favor observing at least until 7 days after her insult for further improvement.   Recommendations: 1) continue Keppra 500 twice daily 2) continue supportive care  Roland Rack, MD [facility] [phone]  If 7pm- 7am, please page neurology on call as listed in [place].

## 2019-05-28 LAB — CBC
HCT: 27.8 % — ABNORMAL LOW (ref 36.0–46.0)
Hemoglobin: 8.1 g/dL — ABNORMAL LOW (ref 12.0–15.0)
MCH: 23.5 pg — ABNORMAL LOW (ref 26.0–34.0)
MCHC: 29.1 g/dL — ABNORMAL LOW (ref 30.0–36.0)
MCV: 80.6 fL (ref 80.0–100.0)
Platelets: 227 10*3/uL (ref 150–400)
RBC: 3.45 MIL/uL — ABNORMAL LOW (ref 3.87–5.11)
RDW: 19.8 % — ABNORMAL HIGH (ref 11.5–15.5)
WBC: 6.7 10*3/uL (ref 4.0–10.5)
nRBC: 0 % (ref 0.0–0.2)

## 2019-05-28 LAB — GLUCOSE, CAPILLARY
Glucose-Capillary: 169 mg/dL — ABNORMAL HIGH (ref 70–99)
Glucose-Capillary: 210 mg/dL — ABNORMAL HIGH (ref 70–99)
Glucose-Capillary: 206 mg/dL — ABNORMAL HIGH (ref 70–99)
Glucose-Capillary: 278 mg/dL — ABNORMAL HIGH (ref 70–99)
Glucose-Capillary: 260 mg/dL — ABNORMAL HIGH (ref 70–99)
Glucose-Capillary: 230 mg/dL — ABNORMAL HIGH (ref 70–99)

## 2019-05-28 LAB — BASIC METABOLIC PANEL
Anion gap: 13 (ref 5–15)
BUN: 71 mg/dL — ABNORMAL HIGH (ref 6–20)
CO2: 24 mmol/L (ref 22–32)
Calcium: 8.1 mg/dL — ABNORMAL LOW (ref 8.9–10.3)
Chloride: 104 mmol/L (ref 98–111)
Creatinine, Ser: 8 mg/dL — ABNORMAL HIGH (ref 0.44–1.00)
GFR calc Af Amer: 6 mL/min — ABNORMAL LOW (ref >60)
GFR calc non Af Amer: 5 mL/min — ABNORMAL LOW (ref >60)
Glucose, Bld: 220 mg/dL — ABNORMAL HIGH (ref 70–99)
Potassium: 5.3 mmol/L — ABNORMAL HIGH (ref 3.5–5.1)
Sodium: 141 mmol/L (ref 135–145)

## 2019-05-28 NOTE — Progress Notes (Signed)
Assisted tele visit to patient with family member.  Lauren Castrillo Ann, RN  

## 2019-05-28 NOTE — Procedures (Signed)
Patient seen on Hemodialysis--fistula cannulated without problems. BP (!) 169/85   Pulse (!) 101   Temp 98.1 F (36.7 C) (Oral)   Resp (!) 25   Ht 5\' 7"  (1.702 m)   Wt 86 kg   SpO2 100%   BMI 29.69 kg/m   QB 400, UF goal 3.3 L  She remains encephalopathic/unresponsive on dialysis.  No acute events noted overnight.  Neurology note reviewed with plan to monitor for at least 7 days following initial injury to allow for prognostication.   Elmarie Shiley MD [facility] Office # [phone] Pager # [phone] 9:41 AM

## 2019-05-28 NOTE — Progress Notes (Signed)
NAME:  Lauren Warner, MRN:  [id number], DOB:  06-02-1966, LOS: 6 ADMISSION DATE:  05/23/2019, CONSULTATION DATE:  05/28/19 REFERRING MD:  , CHIEF COMPLAINT:  Seizures, respiratory failure   Brief History   53 yo female brought to ER after having seizure.  Found to have DKA, hypothermia, and Rt sided lung infiltrate.  Had missed HD as outpt, and K 7.7.  MRI brain concerning for hypoxia/ischemia versus PRES.  Past Medical History  Anxiety, Asthma, CHF, CAD, HA, Depression, ESRD, HLD, HTN, PNA, PA, DM type [place] [facility] Events   12/20 admit to ICU 12/21 transitioning off insulin gtt 12/22-12/24 Remain on ventilator support while awaiting mental status changes 12/25 Code status updated to DNR per daughter  Consults:  Nephrology Neurology  Procedures:  ETT 12/20 >> Lt IJ 12/20 >>  Significant Diagnostic Tests:  12/20 initial head CT >> neg 12/20 CT Head>> rapidly worsening scan with new cytotoxic edema along bilateral cerebral convexities with infarcts 12/20 ECHO> -ECHO LVEF 60-65; grade II diastolic dysfunction. Mildly elevated RVSP 36.9  12/20 MRI brain> Signal abnormality in R cerebral hemisphere and bilateral basal ganglia. (Read as favored to reflect global hypoxic-ischemic injury with infarcts as the seen asymmetry and degree of restricted diffusion are atypical for PRES)  Chest x-ray 12/20  improved bilateral interstitial opacities TTE 12/23 ? EF 60-65%, grade II diastolic dysfunction, moderate left pleural effusion  Micro Data:  12/20 BCx2> GPC>>>coag neg staph  12/20 Sars-Cov-2>>negative  Antimicrobials:  Vancomycin 12/19-12/20 Cefepime 12/19 Ceftriaxone 12/20-12/22  Interim history/subjective:  On vent, HD.  Objective   Blood pressure 119/66, pulse 96, temperature 98.4 F (36.9 C), temperature source Oral, resp. rate (!) 25, height 5\' 7"  (1.702 m), weight 85.4 kg, SpO2 100 %.    Vent Mode: PRVC FiO2 (%):  [40 %] 40 % Set Rate:  [18 bmp] 18 bmp Vt  Set:  [480 mL] 480 mL PEEP:  [5 cmH20] 5 cmH20 Pressure Support:  [10 cmH20] 10 cmH20 Plateau Pressure:  [20 cmH20-24 cmH20] 20 cmH20   Intake/Output Summary (Last 24 hours) at 05/28/2019 1037 Last data filed at 05/28/2019 0600 Gross per 24 hour  Intake 1399.35 ml  Output -  Net 1399.35 ml   Filed Weights   05/26/19 0550 05/27/19 0441 05/28/19 0650  Weight: 90.5 kg 86 kg 85.4 kg   Physical Exam:  General - unresponsive Eyes - Rt ward gaze ENT - ETT in place Cardiac - regular rate/rhythm, no murmur Chest - scattered rhonchi Abdomen - soft, non tender, + bowel sounds Extremities - Lt BKA Skin - no rashes Neuro - not following commands     Resolved Hospital Problem list   Hyperkalemia , DKA, Community acquired bacterial pneumonia  Assessment & Plan:   Acute respiratory failure with hypoxia. - mental status barrier to extubation - will need to discuss with family if they would want to consider tracheostomy  Acute metabolic encephalopathy with concern for hypoxia/ischemia verses PRES in setting of DKA. Seizure. - monitor mental status - continue keppra  HTN. CAD, chronic diastolic CHF. - goal SBP < 160 - continue hydralazine, labetalol, ASA, plavix  ESRD. Hyperkalemia. - continue iHD  DM type 1 poorly controlled. - SSI with levemir  Pressure injury. - stage 2 pressure ulcer to buttock, not present prior to admission  Anemia of critical illness and chronic disease. - f/u CBC   Best practice:  Diet: tube feeds DVT prophylaxis: heparin, SCD GI prophylaxis: PPI Mobility: bed rest Code Status: DNR Disposition:  ICU  Labs:   CMP Latest Ref Rng & Units 05/28/2019 05/27/2019 05/26/2019  Glucose 70 - 99 mg/dL 220(H) 81 171(H)  BUN 6 - 20 mg/dL 71(H) 60(H) 39(H)  Creatinine 0.44 - 1.00 mg/dL 8.00(H) 7.28(H) 5.28(H)  Sodium 135 - 145 mmol/L 141 141 138  Potassium 3.5 - 5.1 mmol/L 5.3(H) 4.6 3.4(L)  Chloride 98 - 111 mmol/L 104 103 98  CO2 22 - 32 mmol/L  24 24 25   Calcium 8.9 - 10.3 mg/dL 8.1(L) 8.3(L) 8.3(L)  Total Protein 6.5 - 8.1 g/dL - - -  Total Bilirubin 0.3 - 1.2 mg/dL - - -  Alkaline Phos 38 - 126 U/L - - -  AST 15 - 41 U/L - - -  ALT 0 - 44 U/L - - -    CBC Latest Ref Rng & Units 05/28/2019 05/27/2019 05/26/2019  WBC 4.0 - 10.5 K/uL 6.7 6.4 5.9  Hemoglobin 12.0 - 15.0 g/dL 8.1(L) 8.2(L) 8.9(L)  Hematocrit 36.0 - 46.0 % 27.8(L) 27.5(L) 28.5(L)  Platelets 150 - 400 K/uL 227 203 187    ABG    Component Value Date/Time   PHART 7.264 (L) 05/28/2019 2328   PCO2ART 29.1 (L) 05/22/2019 2328   PO2ART 212.0 (H) 05/19/2019 2328   HCO3 13.8 (L) 05/07/2019 2328   TCO2 15 (L) 05/23/2019 2328   ACIDBASEDEF 13.0 (H) 05/12/2019 2328   O2SAT 100.0 05/18/2019 2328    CBG (last 3)  Recent Labs    05/27/19 2338 05/28/19 0332 05/28/19 0743  GLUCAP 197* 169* 210*    CC time 32 minutes  Chesley Mires, MD [facility] Pulmonary/Critical Care 05/28/2019, 10:57 AM

## 2019-05-28 NOTE — Progress Notes (Signed)
Patient's daughter called requesting tele-visit with mother, however [employer]'s cameras are currently not functioning. Will set up as soon cameras are back online.  Candy Sledge, RN

## 2019-05-29 LAB — GLUCOSE, CAPILLARY
Glucose-Capillary: 218 mg/dL — ABNORMAL HIGH (ref 70–99)
Glucose-Capillary: 198 mg/dL — ABNORMAL HIGH (ref 70–99)
Glucose-Capillary: 234 mg/dL — ABNORMAL HIGH (ref 70–99)
Glucose-Capillary: 266 mg/dL — ABNORMAL HIGH (ref 70–99)
Glucose-Capillary: 236 mg/dL — ABNORMAL HIGH (ref 70–99)

## 2019-05-29 LAB — RENAL FUNCTION PANEL
Albumin: 2.3 g/dL — ABNORMAL LOW (ref 3.5–5.0)
Anion gap: 13 (ref 5–15)
BUN: 55 mg/dL — ABNORMAL HIGH (ref 6–20)
CO2: 26 mmol/L (ref 22–32)
Calcium: 8.4 mg/dL — ABNORMAL LOW (ref 8.9–10.3)
Chloride: 98 mmol/L (ref 98–111)
Creatinine, Ser: 6.45 mg/dL — ABNORMAL HIGH (ref 0.44–1.00)
GFR calc Af Amer: 8 mL/min — ABNORMAL LOW (ref >60)
GFR calc non Af Amer: 7 mL/min — ABNORMAL LOW (ref >60)
Glucose, Bld: 218 mg/dL — ABNORMAL HIGH (ref 70–99)
Phosphorus: 8 mg/dL — ABNORMAL HIGH (ref 2.5–4.6)
Potassium: 5 mmol/L (ref 3.5–5.1)
Sodium: 137 mmol/L (ref 135–145)

## 2019-05-29 LAB — CBC
HCT: 24.6 % — ABNORMAL LOW (ref 36.0–46.0)
Hemoglobin: 7.5 g/dL — ABNORMAL LOW (ref 12.0–15.0)
MCH: 24.2 pg — ABNORMAL LOW (ref 26.0–34.0)
MCHC: 30.5 g/dL (ref 30.0–36.0)
MCV: 79.4 fL — ABNORMAL LOW (ref 80.0–100.0)
Platelets: 246 10*3/uL (ref 150–400)
RBC: 3.1 MIL/uL — ABNORMAL LOW (ref 3.87–5.11)
RDW: 19.3 % — ABNORMAL HIGH (ref 11.5–15.5)
WBC: 6.8 10*3/uL (ref 4.0–10.5)
nRBC: 0 % (ref 0.0–0.2)

## 2019-05-29 MED ORDER — ALUMINUM HYDROXIDE GEL 320 MG/5ML PO SUSP
30.0000 mL | Freq: Three times a day (TID) | ORAL | Status: DC
  Filled 2019-05-29 (×3): qty 30

## 2019-05-29 MED ORDER — ALUMINUM HYDROXIDE GEL 320 MG/5ML PO SUSP
30.0000 mL | Freq: Three times a day (TID) | ORAL | Status: DC
  Filled 2019-05-29 (×5): qty 30

## 2019-05-29 MED ORDER — ALUMINUM HYDROXIDE GEL 320 MG/5ML PO SUSP
30.0000 mL | Freq: Three times a day (TID) | ORAL | Status: DC
  Administered 2019-05-29: 23:00:00 30 mL
  Filled 2019-05-29 (×3): qty 30

## 2019-05-29 MED ORDER — SODIUM ZIRCONIUM CYCLOSILICATE 10 G PO PACK
10.0000 g | PACK | Freq: Once | ORAL | Status: AC
  Administered 2019-05-29: 13:00:00 10 g via ORAL
  Filled 2019-05-29: qty 1

## 2019-05-29 NOTE — Progress Notes (Addendum)
Patient ID: Lauren Warner, female   DOB: 06/23/1965, 53 y.o.   MRN: [id number]  [facility] Progress Note   Assessment/ Plan:   1. Altered mental status with seizure and MRI findings of PRES versus hypoxic/ischemic encephalopathy: Ongoing close neurological surveillance to help determine overall outlook/prognosis.  Without clinical improvement over the last 7 days following initial insult, poor outlook at this point with what appears to be severe hypoxic/ischemic encephalopathy. 2. ESRD: Continue hemodialysis on a Tuesday/Thursday/Saturday schedule with her last hemodialysis done yesterday, next dialysis due tomorrow.  Mild hyperkalemia likely because of concomitant hyperglycemia.  Overall prognosis appears to be poor with lack of neurological improvement now with 7 days and I appreciate input from palliative care service.  3. Anemia: Without overt loss, continue ESA and monitoring hemoglobin/hematocrit. 4. CKD-MBD: Elevated phosphorus level noted, calcitriol on hold and will begin aluminum hydroxide. 5. Nutrition: On tube feeds, phosphorus binder currently on hold.  6. Hypertension: Blood pressures intermittently elevated, follow with adjustment of antihypertensive therapy.  Subjective:   No acute events overnight, hemodialysis done yesterday without problems   Objective:   BP (!) 151/75   Pulse 97   Temp 98.9 F (37.2 C) (Axillary)   Resp 18   Ht _0  (1.702 m)   Wt 89.1 kg   SpO2 100%   BMI 30.77 kg/m   Physical Exam: Gen: Intubated, unresponsive to voice/pain CVS: Pulse regular rhythm, S1 and S2 normal. Resp: Clear to auscultation, no rales/rhonchi Abd: Soft, obese, nontender Ext: Status post left BKA, no right lower extremity edema.  Pulsatile left upper arm AV fistula..  Labs: BMET Recent Labs  Lab 05/23/19 1515 05/24/19 0342 05/24/19 1819 05/25/19 0522 05/26/19 0548 05/27/19 0500 05/28/19 0500 05/29/19 0649  NA 136 135  --  139 138 141 141 137  K  3.7 4.1  --  4.6 3.4* 4.6 5.3* 5.0  CL 98 98  --  103 98 103 104 98  CO2 22 21*  --  19* _1 GLUCOSE 146* 181*  --  201* 171* 81 220* 218*  BUN 73* 84*  --  94* 39* 60* 71* 55*  CREATININE 9.83* 10.37*  --  10.84* 5.28* 7.28* 8.00* 6.45*  CALCIUM 7.8* 7.7*  --  7.5* 8.3* 8.3* 8.1* 8.4*  PHOS 6.2* 7.7* 9.0* 9.5*  --   --   --  8.0*   CBC Recent Labs  Lab 05/26/19 0548 05/27/19 0500 05/28/19 0500 05/29/19 0649  WBC 5.9 6.4 6.7 6.8  HGB 8.9* 8.2* 8.1* 7.5*  HCT 28.5* 27.5* 27.8* 24.6*  MCV 75.8* 79.7* 80.6 79.4*  PLT 187 203 227 246      Medications:    . aspirin  81 mg Per Tube Daily  . chlorhexidine gluconate (MEDLINE KIT)  15 mL Mouth Rinse BID  . Chlorhexidine Gluconate Cloth  6 each Topical Q0600  . clopidogrel  75 mg Per Tube Daily  . feeding supplement (PRO-STAT SUGAR FREE 64)  30 mL Per Tube TID  . gabapentin  100 mg Per Tube Daily  . heparin  5,000 Units Subcutaneous Q8H  . hydrALAZINE  25 mg Per Tube Q8H  . insulin aspart  0-6 Units Subcutaneous Q4H  . insulin detemir  10 Units Subcutaneous Daily  . labetalol  200 mg Per Tube BID  . latanoprost  1 drop Both Eyes QHS  . levETIRAcetam  500 mg Per Tube BID  . mouth rinse  15 mL Mouth Rinse 10 times  per day  . pantoprazole sodium  40 mg Per Tube Daily  . sodium chloride flush  10-40 mL Intracatheter Q12H  . Thrombi-Pad  1 each Topical Once  Elmarie Shiley, MD 05/29/2019, 9:57 AM

## 2019-05-29 NOTE — Progress Notes (Signed)
Subjective: Still encephalopathic  Exam: Vitals:   05/29/19 1126 05/29/19 1200  BP:  (!) 143/70  Pulse: (!) 101 99  Resp: 16 17  Temp:  99 F (37.2 C)  SpO2: 100% 100%   Gen: In bed, intubated Resp: Ventilated Abd: soft, nt  Neuro: MS: Eyes open, the patient does not engage the examiner or fixate and track CN: Pupils are reactive bilaterally, corneals are intact, doll's eye intact Motor: She withdraws to noxious stimulation bilaterally Sensory: As above   Impression: 53 year old female found down with seizure, MRI compatible with severe pres versus hypoxic/ischemic encephalopathy.  Though she had some improvement between the first and second day that I saw her, over the rest of the week that I have been seeing her, she has really made no progress and has plateaued.  At this point, I think that any recovery that she does have is likely to be prolonged and even if this is PRES, given the degree of diffusion change I would expect her recovery to be incomplete.  She already had multiple medical problems and her daughter indicates that she was already having some thoughts about not liking dialysis, etc. prior to this insult.  Therefore, even though I cannot say with absolute certainty that she is not going to have some degree of recovery, I think that goals of care discussion is appropriate at this time.  Her prognosis at this point is very guarded, very unlikely to ever recover to an independent state of function.  There is some possibility that she could have some improvement, possibly even to consciousness, but I think is more likely that she will have significant deficits.  I discussed with her daughter that I thought that it may be time to talk about withdrawal of care and she is considering this, and I have placed a palliative care consult.   Recommendations: 1) continue Keppra 500 twice daily 2) palliative care consultation 3) neurology will be available on an as-needed basis  should there be change in patient's situation or for family discussion.  Roland Rack, MD [facility] [phone]  If 7pm- 7am, please page neurology on call as listed in [place].

## 2019-05-29 NOTE — Progress Notes (Signed)
Lokelma ordered PO for patient, no option to modify order for Per Tube admin. Cleared that medication is OK to give per tube per pharmacist Dion Body.   Candy Sledge, RN

## 2019-05-29 NOTE — Progress Notes (Signed)
NAME:  Lauren Warner, MRN:  [id number], DOB:  [birth year], LOS: 7 ADMISSION DATE:  05/28/2019, CONSULTATION DATE:  05/29/19 REFERRING MD:  , CHIEF COMPLAINT:  Seizures, respiratory failure   Brief History   53 yo female brought to ER after having seizure.  Found to have DKA, hypothermia, and Rt sided lung infiltrate.  Had missed HD as outpt, and K 7.7.  MRI brain concerning for hypoxia/ischemia versus PRES.  Past Medical History  Anxiety, Asthma, CHF, CAD, HA, Depression, ESRD, HLD, HTN, PNA, PA, DM type [place] Hospital Events   12/20 admit to ICU 12/21 transitioning off insulin gtt 12/22-12/24 Remain on ventilator support while awaiting mental status changes 12/25 Code status updated to DNR per daughter  Consults:  Nephrology Neurology  Procedures:  ETT 12/20 >> Lt IJ 12/20 >>  Significant Diagnostic Tests:  12/20 initial head CT >> neg 12/20 CT Head>> rapidly worsening scan with new cytotoxic edema along bilateral cerebral convexities with infarcts 12/20 ECHO> -ECHO LVEF 60-65; grade II diastolic dysfunction. Mildly elevated RVSP 36.9  12/20 MRI brain> Signal abnormality in R cerebral hemisphere and bilateral basal ganglia. (Read as favored to reflect global hypoxic-ischemic injury with infarcts as the seen asymmetry and degree of restricted diffusion are atypical for PRES)  Chest x-ray 12/20  improved bilateral interstitial opacities TTE 12/23 ? EF 60-65%, grade II diastolic dysfunction, moderate left pleural effusion  Micro Data:  12/20 BCx2> GPC>>>coag neg staph  12/20 Sars-Cov-2>>negative  Antimicrobials:  Vancomycin 12/19-12/20 Cefepime 12/19 Ceftriaxone 12/20-12/22  Interim history/subjective:  Remains intubated with poor neuro exam   Objective   Blood pressure (!) 151/75, pulse 97, temperature 98.9 F (37.2 C), temperature source Axillary, resp. rate 18, height 5\' 7"  (1.702 m), weight 89.1 kg, SpO2 100 %.    Vent Mode: PRVC FiO2 (%):  [40 %] 40  % Set Rate:  [18 bmp] 18 bmp Vt Set:  [480 mL] 480 mL PEEP:  [5 cmH20] 5 cmH20 Pressure Support:  [12 cmH20] 12 cmH20 Plateau Pressure:  [19 cmH20-22 cmH20] 21 cmH20   Intake/Output Summary (Last 24 hours) at 05/29/2019 0908 Last data filed at 05/29/2019 0700 Gross per 24 hour  Intake 1119.38 ml  Output 3307 ml  Net -2187.62 ml   Filed Weights   05/28/19 0650 05/28/19 1100 05/29/19 0600  Weight: 85.4 kg 82.1 kg 89.1 kg   Physical Exam:  General - Unresponsive  HEENT: NCAT ETT in place anicteric sclera pink mmm  Cardiac -RRR s1s2 no rgm  Chest - Scattered rhonchi mechanical vent sounds  Abdomen - soft obese ndnt  Extremities - L BKA  Skin - c/d/w/i without rash  Neuro - Rightward gaze, does not follow commands or awaken    Resolved Hospital Problem list   Hyperkalemia , DKA, Community acquired bacterial pneumonia  Assessment & Plan:   Acute hypoxic respiratory failure requiring intubation - mental status barrier to extubation - Continue vent support - Will need to discuss family views on trach if prolonged care is desired a  Acute metabolic encephalopathy with concern for hypoxia/ischemia vs severe PRES in setting of DKA Seizure - continue to monitor neuro status - BId keppra  - neuro recommends monitor x 7 day after initial insult (12/20 presentation) for prognostication   HTN CAD Chronic diastolic CHF  - goal SBP < 160 - continue hydralazine, labetalol, ASA, plavix  ESRD Hyperkalemia Hyperphosphatemia - iHd per nephrology   DM type 1 poorly controlled. - SSI with levemir  Pressure injury. -  stage 2 pressure ulcer to buttock, not present prior to admission  Anemia of critical illness and chronic disease  - trend CBC - consider decreasing CBC to qOD to limit iatrogenic blood loss   Best practice:  Diet: tube feeds DVT prophylaxis: heparin, SCD GI prophylaxis: PPI Mobility: bed rest Code Status: DNR  Disposition: ICU  Labs:   CMP Latest Ref  Rng & Units 05/29/2019 05/28/2019 05/27/2019  Glucose 70 - 99 mg/dL 218(H) 220(H) 81  BUN 6 - 20 mg/dL 55(H) 71(H) 60(H)  Creatinine 0.44 - 1.00 mg/dL 6.45(H) 8.00(H) 7.28(H)  Sodium 135 - 145 mmol/L 137 141 141  Potassium 3.5 - 5.1 mmol/L 5.0 5.3(H) 4.6  Chloride 98 - 111 mmol/L 98 104 103  CO2 22 - 32 mmol/L 26 24 24   Calcium 8.9 - 10.3 mg/dL 8.4(L) 8.1(L) 8.3(L)  Total Protein 6.5 - 8.1 g/dL - - -  Total Bilirubin 0.3 - 1.2 mg/dL - - -  Alkaline Phos 38 - 126 U/L - - -  AST 15 - 41 U/L - - -  ALT 0 - 44 U/L - - -    CBC Latest Ref Rng & Units 05/29/2019 05/28/2019 05/27/2019  WBC 4.0 - 10.5 K/uL 6.8 6.7 6.4  Hemoglobin 12.0 - 15.0 g/dL 7.5(L) 8.1(L) 8.2(L)  Hematocrit 36.0 - 46.0 % 24.6(L) 27.8(L) 27.5(L)  Platelets 150 - 400 K/uL 246 227 203    ABG    Component Value Date/Time   PHART 7.264 (L) 05/12/2019 2328   PCO2ART 29.1 (L) 05/26/2019 2328   PO2ART 212.0 (H) 05/30/2019 2328   HCO3 13.8 (L) 05/30/2019 2328   TCO2 15 (L) 05/12/2019 2328   ACIDBASEDEF 13.0 (H) 05/26/2019 2328   O2SAT 100.0 05/20/2019 2328    CBG (last 3)  Recent Labs    05/28/19 2319 05/29/19 0423 05/29/19 0756  GLUCAP 230* 218* 198*    CRITICAL CARE Performed by: Cristal Generous   Total critical care time: 35 minutes  Critical care time was exclusive of separately billable procedures and treating other patients. Critical care was necessary to treat or prevent imminent or life-threatening deterioration.  Critical care was time spent personally by me on the following activities: development of treatment plan with patient and/or surrogate as well as nursing, discussions with consultants, evaluation of patient's response to treatment, examination of patient, obtaining history from patient or surrogate, ordering and performing treatments and interventions, ordering and review of laboratory studies, ordering and review of radiographic studies, pulse oximetry and re-evaluation of patient's  condition.  Eliseo Gum MSN, AGACNP-BC [place] [id number] If no answer, [id number] 05/29/2019, 9:08 AM

## 2019-05-30 ENCOUNTER — Inpatient Hospital Stay (HOSPITAL_COMMUNITY): Payer: Medicare Other

## 2019-05-30 DIAGNOSIS — G931 Anoxic brain damage, not elsewhere classified: Secondary | ICD-10-CM

## 2019-05-30 LAB — GLUCOSE, CAPILLARY
Glucose-Capillary: 225 mg/dL — ABNORMAL HIGH (ref 70–99)
Glucose-Capillary: 239 mg/dL — ABNORMAL HIGH (ref 70–99)
Glucose-Capillary: 247 mg/dL — ABNORMAL HIGH (ref 70–99)
Glucose-Capillary: 248 mg/dL — ABNORMAL HIGH (ref 70–99)
Glucose-Capillary: 291 mg/dL — ABNORMAL HIGH (ref 70–99)
Glucose-Capillary: 296 mg/dL — ABNORMAL HIGH (ref 70–99)
Glucose-Capillary: 336 mg/dL — ABNORMAL HIGH (ref 70–99)

## 2019-05-30 LAB — BASIC METABOLIC PANEL
Anion gap: 16 — ABNORMAL HIGH (ref 5–15)
BUN: 73 mg/dL — ABNORMAL HIGH (ref 6–20)
CO2: 26 mmol/L (ref 22–32)
Calcium: 8.5 mg/dL — ABNORMAL LOW (ref 8.9–10.3)
Chloride: 97 mmol/L — ABNORMAL LOW (ref 98–111)
Creatinine, Ser: 7.7 mg/dL — ABNORMAL HIGH (ref 0.44–1.00)
GFR calc Af Amer: 6 mL/min — ABNORMAL LOW (ref >60)
GFR calc non Af Amer: 5 mL/min — ABNORMAL LOW (ref >60)
Glucose, Bld: 251 mg/dL — ABNORMAL HIGH (ref 70–99)
Potassium: 5.2 mmol/L — ABNORMAL HIGH (ref 3.5–5.1)
Sodium: 139 mmol/L (ref 135–145)

## 2019-05-30 LAB — CBC
HCT: 23.9 % — ABNORMAL LOW (ref 36.0–46.0)
Hemoglobin: 7.1 g/dL — ABNORMAL LOW (ref 12.0–15.0)
MCH: 23.6 pg — ABNORMAL LOW (ref 26.0–34.0)
MCHC: 29.7 g/dL — ABNORMAL LOW (ref 30.0–36.0)
MCV: 79.4 fL — ABNORMAL LOW (ref 80.0–100.0)
Platelets: 242 10*3/uL (ref 150–400)
RBC: 3.01 MIL/uL — ABNORMAL LOW (ref 3.87–5.11)
RDW: 18.8 % — ABNORMAL HIGH (ref 11.5–15.5)
WBC: 6.8 10*3/uL (ref 4.0–10.5)
nRBC: 0 % (ref 0.0–0.2)

## 2019-05-30 MED ORDER — INSULIN DETEMIR 100 UNIT/ML ~~LOC~~ SOLN
10.0000 [IU] | Freq: Two times a day (BID) | SUBCUTANEOUS | Status: DC
  Administered 2019-05-30 – 2019-05-31 (×2): 10 [IU] via SUBCUTANEOUS
  Filled 2019-05-30 (×3): qty 0.1

## 2019-05-30 MED ORDER — ALUMINUM HYDROXIDE GEL 320 MG/5ML PO SUSP
30.0000 mL | Freq: Three times a day (TID) | ORAL | Status: DC
  Filled 2019-05-30: qty 30

## 2019-05-30 MED ORDER — ALUMINUM HYDROXIDE GEL 320 MG/5ML PO SUSP
30.0000 mL | Freq: Three times a day (TID) | ORAL | Status: DC
  Administered 2019-05-30: 19:00:00 30 mL via ORAL
  Filled 2019-05-30 (×8): qty 30

## 2019-05-30 MED ORDER — CHLORHEXIDINE GLUCONATE CLOTH 2 % EX PADS: 6.0000 | MEDICATED_PAD | Freq: Every day | CUTANEOUS | Status: DC

## 2019-05-30 MED ORDER — B COMPLEX-C PO TABS
1.0000 | ORAL_TABLET | Freq: Every day | ORAL | Status: DC
  Administered 2019-05-30 – 2019-05-31 (×2): 1 via ORAL
  Filled 2019-05-30 (×2): qty 1

## 2019-05-30 MED ORDER — ACETAMINOPHEN 325 MG PO TABS: 650.0000 mg | ORAL_TABLET | Freq: Four times a day (QID) | ORAL | Status: DC | PRN

## 2019-05-30 MED ORDER — INSULIN ASPART 100 UNIT/ML ~~LOC~~ SOLN
2.0000 [IU] | SUBCUTANEOUS | Status: DC
  Administered 2019-05-30 – 2019-05-31 (×7): 2 [IU] via SUBCUTANEOUS

## 2019-05-30 NOTE — Progress Notes (Signed)
NAME:  Lauren Warner, MRN:  [id number], DOB:  1965/06/27, LOS: 8 ADMISSION DATE:  05/14/2019, CONSULTATION DATE:  05/30/19 REFERRING MD:  , CHIEF COMPLAINT:  Seizures, respiratory failure   Brief History   53 yo female brought to ER after having seizure.  Found to have DKA, hypothermia, and Rt sided lung infiltrate.  Had missed HD as outpt, and K 7.7.  MRI brain concerning for hypoxia/ischemia versus PRES.  Past Medical History  Anxiety, Asthma, CHF, CAD, HA, Depression, ESRD, HLD, HTN, PNA, PA, DM type [place] [facility] Events   12/20 admit to ICU 12/21 transitioning off insulin gtt 12/22-12/24 Remain on ventilator support while awaiting mental status changes 12/25 Code status updated to DNR per daughter  Consults:  Nephrology Neurology  Procedures:  ETT 12/20 > Lt IJ 12/20 >  Significant Diagnostic Tests:  12/20 initial head CT >> neg  12/20 CT Head>> rapidly worsening scan with new cytotoxic edema along bilateral cerebral convexities with infarcts  12/20 ECHO> -ECHO LVEF 60-65; grade II diastolic dysfunction. Mildly elevated RVSP 36.9   12/20 MRI brain> Signal abnormality in R cerebral hemisphere and bilateral basal ganglia. (Read as favored to reflect global hypoxic-ischemic injury with infarcts as the seen asymmetry and degree of restricted diffusion are atypical for PRES)   Chest x-ray > 12/20  improved bilateral interstitial opacities  TTE 12/23 > EF 60-65%, grade II diastolic dysfunction, moderate left pleural effusion  Micro Data:  12/19 COVID > negative  12/20 BCx2> GPC > coag neg staph  12/20 Sars-Cov-2 > negative  Antimicrobials:  Vancomycin 12/19 > 12/20 Cefepime 12/19 Ceftriaxone 12/20 > 12/22  Interim history/subjective:  Lying in bed, unresponsive with no continuous sedation currently, no acute events overnight   Objective   Blood pressure (!) 169/89, pulse 94, temperature 98.5 F (36.9 C), temperature source Axillary, resp. rate 15, height  5\' 7"  (1.702 m), weight 89.1 kg, SpO2 100 %.    Vent Mode: PRVC FiO2 (%):  [40 %] 40 % Set Rate:  [18 bmp] 18 bmp Vt Set:  [480 mL] 480 mL PEEP:  [5 cmH20] 5 cmH20 Pressure Support:  [18 cmH20] 18 cmH20 Plateau Pressure:  [19 cmH20-20 cmH20] 19 cmH20   Intake/Output Summary (Last 24 hours) at 05/30/2019 [id number] Last data filed at 05/30/2019 [id number] Gross per 24 hour  Intake 1055.96 ml  Output --  Net 1055.96 ml   Filed Weights   05/28/19 1100 05/29/19 0600 05/30/19 0400  Weight: 82.1 kg 89.1 kg 89.1 kg   Physical Exam: General: Chronically ill appearing elderly female on mechanical ventilation, in NAD HEENT: ETT, MM pink/moist, PERRL,  Neuro: unresponsive  CV: s1s2 regular rate and rhythm, no murmur, rubs, or gallops,  PULM:  Clear to ascultation  GI: soft, bowel sounds active in all 4 quadrants, non-tender, non-distended, tolerating TF Extremities: warm/dry, no edema, left BKA  Skin: no rashes or lesions  Resolved Hospital Problem list   Hyperkalemia , DKA, Community acquired bacterial pneumonia  Assessment & Plan:   Acute hypoxic respiratory failure requiring intubation P: Continue ventilator support with lung protective strategies  Currently weaning well but mentation remains barrier to extubation  Follow intermittent CRX and ABG  Ensure adequate pulmonary hygiene  VAP bundle in place  Currently on no continuous sedation Goals of care discussed with family per neurology, palliative consult placed   Acute metabolic encephalopathy with concern for hypoxia/ischemia vs severe PRES in setting of DKA Seizure Plan: Management per neurology Prognosis of meaningful recovery remains  poor Continue Kepprs    HTN CAD Chronic diastolic CHF  Plan: SBP goal < 160 Continue Hydralizine, Labetalol, ASA, and plavix  ESRD Hyperkalemia Hyperphosphatemia Plan: iHD per neurology  Scheduled T/TH/S  DM type 1 poorly controlled. Plan: SSI  Long acting Levemir   Pressure  injury. -stage 2 pressure ulcer to buttock, not present prior to admission Plan: Wound care Q2hr turn Frequent peri care   Anemia of critical illness and chronic disease  Plan: Trend CBC  Transfuse per protocol   Best practice:  Diet: tube feeds DVT prophylaxis: heparin, SCD GI prophylaxis: PPI Mobility: bed rest Code Status: DNR  Disposition: ICU  Labs:   CMP Latest Ref Rng & Units 05/30/2019 05/29/2019 05/28/2019  Glucose 70 - 99 mg/dL 251(H) 218(H) 220(H)  BUN 6 - 20 mg/dL 73(H) 55(H) 71(H)  Creatinine 0.44 - 1.00 mg/dL 7.70(H) 6.45(H) 8.00(H)  Sodium 135 - 145 mmol/L 139 137 141  Potassium 3.5 - 5.1 mmol/L 5.2(H) 5.0 5.3(H)  Chloride 98 - 111 mmol/L 97(L) 98 104  CO2 22 - 32 mmol/L 26 26 24   Calcium 8.9 - 10.3 mg/dL 8.5(L) 8.4(L) 8.1(L)  Total Protein 6.5 - 8.1 g/dL - - -  Total Bilirubin 0.3 - 1.2 mg/dL - - -  Alkaline Phos 38 - 126 U/L - - -  AST 15 - 41 U/L - - -  ALT 0 - 44 U/L - - -    CBC Latest Ref Rng & Units 05/30/2019 05/29/2019 05/28/2019  WBC 4.0 - 10.5 K/uL 6.8 6.8 6.7  Hemoglobin 12.0 - 15.0 g/dL 7.1(L) 7.5(L) 8.1(L)  Hematocrit 36.0 - 46.0 % 23.9(L) 24.6(L) 27.8(L)  Platelets 150 - 400 K/uL 242 246 227    ABG    Component Value Date/Time   PHART 7.264 (L) 05/18/2019 2328   PCO2ART 29.1 (L) 05/08/2019 2328   PO2ART 212.0 (H) 05/24/2019 2328   HCO3 13.8 (L) 05/29/2019 2328   TCO2 15 (L) 05/07/2019 2328   ACIDBASEDEF 13.0 (H) 05/08/2019 2328   O2SAT 100.0 05/29/2019 2328    CBG (last 3)  Recent Labs    05/30/19 0021 05/30/19 0338 05/30/19 0822  GLUCAP 239* 225* 248*    CRITICAL CARE Performed by: Johnsie Cancel   Total critical care time: 38 minutes  Critical care time was exclusive of separately billable procedures and treating other patients. Critical care was necessary to treat or prevent imminent or life-threatening deterioration.  Critical care was time spent personally by me on the following activities: development of  treatment plan with patient and/or surrogate as well as nursing, discussions with consultants, evaluation of patient's response to treatment, examination of patient, obtaining history from patient or surrogate, ordering and performing treatments and interventions, ordering and review of laboratory studies, ordering and review of radiographic studies, pulse oximetry and re-evaluation of patient's condition.  Johnsie Cancel, NP-C [facility] Contact / Pager information can be found on Amion  05/30/2019, 9:09 AM

## 2019-05-30 NOTE — Progress Notes (Signed)
Inpatient Diabetes Program Recommendations  AACE/ADA: New Consensus Statement on Inpatient Glycemic Control (2015)  Target Ranges:  Prepandial:   less than 140 mg/dL      Peak postprandial:   less than 180 mg/dL (1-2 hours)      Critically ill patients:  140 - 180 mg/dL   Lab Results  Component Value Date   GLUCAP 248 (H) 05/30/2019   HGBA1C 9.6 (H) 04/06/2019    Review of Glycemic Control Results for Lauren Warner, Lauren Warner (MRN [id number]) as of 05/30/2019 09:19  Ref. Range 05/29/2019 19:30 05/30/2019 00:21 05/30/2019 03:38 05/30/2019 08:22  Glucose-Capillary Latest Ref Range: 70 - 99 mg/dL 236 (H) 239 (H) 225 (H) 248 (H)    Diabetes history: DM 2 Outpatient Diabetes medications: Novolog 3 units tid with meals Current orders for Inpatient glycemic control:  Levemir 10 units Novolog 0-6 units Q4 hours  Vital 1.5 40 ml/hour  Inpatient Diabetes Program Recommendations:    Glucose trends elevated on Tube Feeds  Consider Novolog 2 units Q4 hours Tube Feed Coverage (Do not give if tube feeds are stopped or held)  Thanks, Bronson Curb, MSN, RNC-OB Diabetes Coordinator [phone] (8a-5p)

## 2019-05-30 NOTE — Progress Notes (Signed)
Nutrition Follow up  DOCUMENTATION CODES:   Not applicable  INTERVENTION:   Add B complex with Vitamin C to account for losses with HD  Continue tube feeding:  -Vital 1.5 @ 40 ml/hr via OG tube -30 ml Prostat TID  Provides: 1740 kcal, 109 grams protein, and 733 ml free water. Meets 100% of needs.   NUTRITION DIAGNOSIS:   Increased nutrient needs related to (ESRD and related therapies) as evidenced by estimated needs.  Ongoing  GOAL:   Patient will meet greater than or equal to 90% of their needs   Addressed via TF  MONITOR:   TF tolerance  REASON FOR ASSESSMENT:   Consult, Ventilator Enteral/tube feeding initiation and management  ASSESSMENT:   Pt with PMH of CHF, ESRD on HD, HTN, HLD, Type 1 DM and PE who had missed HD recently admitted after being found down at home during a well check seizing.  12/20 MRI shows global hypoxic ischemic injury 12/21 transitioned off insulin gtt  Off cleviprex. Remains unresponsive without sedation. Prognosis of meaningful recovery remains poor. [place] meeting planned with family. Per RN, pt tolerating tube feeding at goal.   EDW: 84 kg  Current weight: 89.1 kg   Patient remains intubated on ventilator support MV: 8.4 L/min Temp (24hrs), Avg:99.1 F (37.3 C), Min:98.4 F (36.9 C), Max:100 F (37.8 C)   I/O: +8,103 ml since admit Last HD 12/26: 3307 ml net UF  Drips: NS @ 10 ml/hr  Medications: SS novolog, levemir Labs: K 5.2 (H) CBG 234-266  Diet Order:   Diet Order            Diet NPO time specified  Diet effective now              EDUCATION NEEDS:   No education needs have been identified at this time  Skin:  Skin Assessment: Skin Integrity Issues: Skin Integrity Issues:: Stage II, Other (Comment) Stage II: buttocks Other: MASD- buttocks, groin  Last BM:  12/27  Height:   Ht Readings from Last 1 Encounters:  05/22/19 5\' 7"  (1.702 m)    Weight:   Wt Readings from Last 1 Encounters:  05/30/19  89.1 kg    Ideal Body Weight:  57.6 kg  BMI:  Adjusted: 31.4  Estimated Nutritional Needs:   Kcal:  1711 kcal  Protein:  105-120 grams  Fluid:  1000 ml + UOP  Mariana Single RD, LDN Clinical Nutrition Pager # [phone]

## 2019-05-30 NOTE — Progress Notes (Signed)
Spoke with RN Roselyn Reef RN and she states she is aware of the DC central line order

## 2019-05-30 NOTE — Progress Notes (Signed)
Assisted tele visit to patient with daughter.  Alver Sorrow, RN

## 2019-05-30 NOTE — Progress Notes (Signed)
Patient ID: Lauren Warner, female   DOB: 03-Feb-1966, 53 y.o.   MRN: [id number]  [facility] Progress Note   Assessment/ Plan:   1. Altered mental status with seizure and MRI findings of PRES versus hypoxic/ischemic encephalopathy: Ongoing close neurological surveillance to help determine overall outlook/prognosis.  Without clinical improvement over the last 7 days following initial insult, poor outlook at this point with what appears to be severe hypoxic/ischemic encephalopathy. 2. ESRD: cont HD TTS schedule. Had HD yest per holiday schedule, plan next HD tomorrow.  Overall prognosis appears to be poor with lack of neurological improvement after 1 week. Appreciate input from palliative care service.  3. Anemia: Without overt loss, continue ESA and monitoring hemoglobin/hematocrit. 4. CKD-MBD: Elevated phosphorus level noted, calcitriol on hold and will begin aluminum hydroxide. 5. Nutrition: On tube feeds, phosphorus binder currently on hold.  6. Hypertension: Blood pressures intermittently elevated, on NG hydralazine/ labetalol.  Kelly Splinter, MD 05/30/2019, 12:08 PM   Subjective:   Pt seen in icu, not responsive   Objective:   BP (!) 125/54   Pulse 84   Temp 99 F (37.2 C) (Axillary)   Resp 16   Ht 5' 7"  (1.702 m)   Wt 89.1 kg   SpO2 100%   BMI 30.77 kg/m   Physical Exam: Gen: Intubated, unresponsive to voice/pain CVS: Pulse regular rhythm, S1 and S2 normal. Resp: Clear to auscultation, no rales/rhonchi Abd: Soft, obese, nontender Ext: Status post left BKA, no right lower extremity edema.  Pulsatile left upper arm AV fistula..  [facility]patient HD: [facility]  4h 84kg 2/2 L AVF Heparin none L AVF - calc 0.25 tiw -mircera 100q2wks - last 12/12     Medications:    . aluminum hydroxide  30 mL Oral TID  . aspirin  81 mg Per Tube Daily  . chlorhexidine gluconate (MEDLINE KIT)  15 mL Mouth Rinse BID  . Chlorhexidine Gluconate Cloth  6 each Topical Q0600   . clopidogrel  75 mg Per Tube Daily  . feeding supplement (PRO-STAT SUGAR FREE 64)  30 mL Per Tube TID  . heparin  5,000 Units Subcutaneous Q8H  . hydrALAZINE  25 mg Per Tube Q8H  . insulin aspart  0-6 Units Subcutaneous Q4H  . insulin detemir  10 Units Subcutaneous Daily  . labetalol  200 mg Per Tube BID  . latanoprost  1 drop Both Eyes QHS  . levETIRAcetam  500 mg Per Tube BID  . mouth rinse  15 mL Mouth Rinse 10 times per day  . pantoprazole sodium  40 mg Per Tube Daily  . sodium chloride flush  10-40 mL Intracatheter Q12H

## 2019-05-30 NOTE — Progress Notes (Signed)
Neurology Progress Note   S:// Seen and examined.   O:// Current vital signs: BP (!) 168/70   Pulse 84   Temp 100 F (37.8 C) (Axillary)   Resp 18   Ht _0  (1.702 m)   Wt 89.1 kg   SpO2 100%   BMI 30.77 kg/m  Vital signs in last 24 hours: Temp:  [98.4 F (36.9 C)-100 F (37.8 C)] 100 F (37.8 C) (12/28 1200) Pulse Rate:  [83-98] 84 (12/28 1800) Resp:  [14-24] 18 (12/28 1800) BP: (125-174)/(54-89) 168/70 (12/28 1800) SpO2:  [100 %] 100 % (12/28 1800) FiO2 (%):  [40 %] 40 % (12/28 1519) Weight:  [89.1 kg] 89.1 kg (12/28 0400) Neurological exam Intubated.  On no sedation. Mental status: Opens eyes to nox stim. No spontaneous movements.  Does not track the examiner. Cranial nerves: Pupils equal round reactive to light.  Corneals intact.  Doll's eye intact.  Breathes over the ventilator.  Motor exam: Grimace and eye opening to noxious stimulation in upper extremity but no withdrawal.  Right lower extremity with some triple flexion. Sensory: As above  Medications  Current Facility-Administered Medications:  .  0.9 %  sodium chloride infusion, , Intravenous, Continuous, Sood, Vineet, MD, Last Rate: 10 mL/hr at 05/30/19 1500, Rate Verify at 05/30/19 1500 .  acetaminophen (TYLENOL) tablet 650 mg, 650 mg, Oral, Q6H PRN, Merlene Laughter F, NP .  albuterol (PROVENTIL) (2.5 MG/3ML) 0.083% nebulizer solution 2.5 mg, 2.5 mg, Inhalation, Q6H PRN, Gleason, Otilio Carpen, PA-C .  aluminum hydroxide (AMPHOJEL/ALTERNAGEL) suspension 30 mL, 30 mL, Oral, TID, Chesley Mires, MD, 30 mL at 05/30/19 1852 .  aspirin chewable tablet 81 mg, 81 mg, Per Tube, Daily, Rigoberto Noel, MD, 81 mg at 05/30/19 0950 .  B-complex with vitamin C tablet 1 tablet, 1 tablet, Oral, Daily, Chesley Mires, MD, 1 tablet at 05/30/19 1659 .  chlorhexidine gluconate (MEDLINE KIT) (PERIDEX) 0.12 % solution 15 mL, 15 mL, Mouth Rinse, BID, Lannan, Margo T, MD, 15 mL at 05/30/19 0728 .  Chlorhexidine Gluconate Cloth 2 % PADS 6  each, 6 each, Topical, Q0600, Alric Seton, PA-C, 6 each at 05/29/19 0350 .  [START ON 2019/06/23] Chlorhexidine Gluconate Cloth 2 % PADS 6 each, 6 each, Topical, Q0600, Roney Jaffe, MD .  clopidogrel (PLAVIX) tablet 75 mg, 75 mg, Per Tube, Daily, Rigoberto Noel, MD, 75 mg at 05/30/19 0950 .  dextrose 50 % solution 0-50 mL, 0-50 mL, Intravenous, PRN, Gleason, Otilio Carpen, PA-C .  feeding supplement (PRO-STAT SUGAR FREE 64) liquid 30 mL, 30 mL, Per Tube, TID, Chesley Mires, MD, 30 mL at 05/30/19 1659 .  feeding supplement (VITAL 1.5 CAL) liquid 1,000 mL, 1,000 mL, Per Tube, Continuous, Sood, Vineet, MD, Last Rate: 40 mL/hr at 05/30/19 0126, 1,000 mL at 05/30/19 0126 .  fentaNYL (SUBLIMAZE) injection 25-100 mcg, 25-100 mcg, Intravenous, Q2H PRN, Rigoberto Noel, MD .  heparin injection 5,000 Units, 5,000 Units, Subcutaneous, Q8H, Gleason, Otilio Carpen, PA-C, 5,000 Units at 05/30/19 1453 .  hydrALAZINE (APRESOLINE) injection 10 mg, 10 mg, Intravenous, Q6H PRN, Margaretha Seeds, MD, 10 mg at 05/30/19 1807 .  hydrALAZINE (APRESOLINE) tablet 25 mg, 25 mg, Per Tube, Q8H, Margaretha Seeds, MD, 25 mg at 05/30/19 1453 .  insulin aspart (novoLOG) injection 0-6 Units, 0-6 Units, Subcutaneous, Q4H, Bowser, Laurel Dimmer, NP, 2 Units at 05/30/19 1659 .  insulin aspart (novoLOG) injection 2 Units, 2 Units, Subcutaneous, Q4H, Merlene Laughter F, NP, 2 Units at 05/30/19 1659 .  insulin detemir (LEVEMIR) injection 10 Units, 10 Units, Subcutaneous, BID, Candee Furbish, MD .  labetalol (NORMODYNE) injection 10-20 mg, 10-20 mg, Intravenous, Q2H PRN, Aroor, Karena Addison R, MD, 20 mg at 05/30/19 1711 .  labetalol (NORMODYNE) tablet 200 mg, 200 mg, Per Tube, BID, Elmarie Shiley, MD, 200 mg at 05/30/19 0949 .  latanoprost (XALATAN) 0.005 % ophthalmic solution 1 drop, 1 drop, Both Eyes, QHS, Gleason, Otilio Carpen, PA-C, 1 drop at 05/29/19 2251 .  levETIRAcetam (KEPPRA) 100 MG/ML solution 500 mg, 500 mg, Per Tube, BID, Greta Doom, MD,  500 mg at 05/30/19 0949 .  MEDLINE mouth rinse, 15 mL, Mouth Rinse, 10 times per day, Daryll Brod T, MD, 15 mL at 05/30/19 1807 .  pantoprazole sodium (PROTONIX) 40 mg/20 mL oral suspension 40 mg, 40 mg, Per Tube, Daily, Daryll Brod T, MD, 40 mg at 05/30/19 0952 .  sodium chloride flush (NS) 0.9 % injection 10-40 mL, 10-40 mL, Intracatheter, Q12H, Rigoberto Noel, MD, 40 mL at 05/30/19 0952 .  sodium chloride flush (NS) 0.9 % injection 10-40 mL, 10-40 mL, Intracatheter, PRN, Rigoberto Noel, MD Labs CBC    Component Value Date/Time   WBC 6.8 05/30/2019 0501   RBC 3.01 (L) 05/30/2019 0501   HGB 7.1 (L) 05/30/2019 0501   HCT 23.9 (L) 05/30/2019 0501   PLT 242 05/30/2019 0501   MCV 79.4 (L) 05/30/2019 0501   MCH 23.6 (L) 05/30/2019 0501   MCHC 29.7 (L) 05/30/2019 0501   RDW 18.8 (H) 05/30/2019 0501   LYMPHSABS 1.0 05/11/2019 2004   MONOABS 0.4 05/19/2019 2004   EOSABS 0.0 05/10/2019 2004   BASOSABS 0.0 05/15/2019 2004    CMP     Component Value Date/Time   NA 139 05/30/2019 0501   K 5.2 (H) 05/30/2019 0501   CL 97 (L) 05/30/2019 0501   CO2 26 05/30/2019 0501   GLUCOSE 251 (H) 05/30/2019 0501   BUN 73 (H) 05/30/2019 0501   CREATININE 7.70 (H) 05/30/2019 0501   CALCIUM 8.5 (L) 05/30/2019 0501   PROT 6.3 (L) 05/19/2019 2004   ALBUMIN 2.3 (L) 05/29/2019 0649   AST 26 05/24/2019 2004   ALT 11 06/02/2019 2004   ALKPHOS 51 05/24/2019 2004   BILITOT 1.1 05/13/2019 2004   GFRNONAA 5 (L) 05/30/2019 0501   GFRAA 6 (L) 05/30/2019 0501    Imaging I have reviewed images in epic and the results pertinent to this consultation are: MRI compatible with global hypoxic anoxic injury with infarcts and a component of posterior reversible encephalopathy syndrome.  Assessment: Lauren Warner, is a 53 year old female, who was found down with a seizure, hypothermic with an unknown downtime whose MRI is compatible with hypoxic ischemic injury versus severe posterior reversible encephalopathy  syndrome. There had been some reported improvement in the last week and the first or second of my partner examining him but she has made no progress since then and currently remains unchanged from the last documented exam. I had a very detailed discussion with the daughter, following the discussions that Dr. Leonel Ramsay already had with her regarding the fact that given her multiple comorbidities, even with her young age and given MRI findings and clinical exam not improving in the past 9 days, chances of neurological meaningful recovery are very low. It is very unlikely that she would recover to a independent state of functioning.  The daughter would like to speak to social work regarding some financial/travel arrangements that can be made as she lives  in [place] and is unable to afford to fly here.  She is inclined towards withdrawal of care at this time.  I would recommend palliative care assistance to see if this can be taken further had.  Impression Significant hypoxic anoxic brain injury Severe toxic metabolic encephalopathy  Recommendations: Continue with [facility] Palliative care consult Social work consult-to get in touch with the daughter and answer questions about finances as well as travel arrangements. I had a detailed discussion with the daughter as documented above I relayed my plan with the primary team APP.  Neurology will be available as needed.   -- Amie Portland, MD [facility] Pager: [phone] If 7pm to 7am, please call on call as listed on AMION.  CRITICAL CARE ATTESTATION Performed by: Amie Portland, MD Total critical care time: 52 minutes Critical care time was exclusive of separately billable procedures and treating other patients and/or supervising APPs/Residents/Students Critical care was necessary to treat or prevent imminent or life-threatening deterioration due to severe hypoxic anoxic brain injury This patient is critically ill and at  significant risk for neurological worsening and/or death and care requires constant monitoring. Critical care was time spent personally by me on the following activities: development of treatment plan with patient and/or surrogate as well as nursing, discussions with consultants, evaluation of patient's response to treatment, examination of patient, obtaining history from patient or surrogate, ordering and performing treatments and interventions, ordering and review of laboratory studies, ordering and review of radiographic studies, pulse oximetry, re-evaluation of patient's condition, participation in multidisciplinary rounds and medical decision making of high complexity in the care of this patient.

## 2019-05-31 LAB — BASIC METABOLIC PANEL
Anion gap: 14 (ref 5–15)
BUN: 97 mg/dL — ABNORMAL HIGH (ref 6–20)
CO2: 27 mmol/L (ref 22–32)
Calcium: 8.7 mg/dL — ABNORMAL LOW (ref 8.9–10.3)
Chloride: 98 mmol/L (ref 98–111)
Creatinine, Ser: 8.98 mg/dL — ABNORMAL HIGH (ref 0.44–1.00)
GFR calc Af Amer: 5 mL/min — ABNORMAL LOW (ref >60)
GFR calc non Af Amer: 5 mL/min — ABNORMAL LOW (ref >60)
Glucose, Bld: 184 mg/dL — ABNORMAL HIGH (ref 70–99)
Potassium: 6.2 mmol/L — ABNORMAL HIGH (ref 3.5–5.1)
Sodium: 139 mmol/L (ref 135–145)

## 2019-05-31 LAB — CBC
HCT: 24 % — ABNORMAL LOW (ref 36.0–46.0)
Hemoglobin: 7.2 g/dL — ABNORMAL LOW (ref 12.0–15.0)
MCH: 23.5 pg — ABNORMAL LOW (ref 26.0–34.0)
MCHC: 30 g/dL (ref 30.0–36.0)
MCV: 78.2 fL — ABNORMAL LOW (ref 80.0–100.0)
Platelets: 282 10*3/uL (ref 150–400)
RBC: 3.07 MIL/uL — ABNORMAL LOW (ref 3.87–5.11)
RDW: 18.6 % — ABNORMAL HIGH (ref 11.5–15.5)
WBC: 7.1 10*3/uL (ref 4.0–10.5)
nRBC: 0 % (ref 0.0–0.2)

## 2019-05-31 LAB — GLUCOSE, CAPILLARY
Glucose-Capillary: 246 mg/dL — ABNORMAL HIGH (ref 70–99)
Glucose-Capillary: 123 mg/dL — ABNORMAL HIGH (ref 70–99)
Glucose-Capillary: 137 mg/dL — ABNORMAL HIGH (ref 70–99)

## 2019-05-31 MED ORDER — GLYCOPYRROLATE 0.2 MG/ML IJ SOLN: 0.2000 mg | INTRAMUSCULAR | Status: DC | PRN

## 2019-05-31 MED ORDER — DIPHENHYDRAMINE HCL 50 MG/ML IJ SOLN: 25.0000 mg | INTRAMUSCULAR | Status: DC | PRN

## 2019-05-31 MED ORDER — LORAZEPAM 2 MG/ML IJ SOLN
2.0000 mg | INTRAMUSCULAR | Status: DC | PRN
  Administered 2019-05-31: 2 mg via INTRAVENOUS
  Filled 2019-05-31: qty 1

## 2019-05-31 MED ORDER — DEXTROSE 5 % IV SOLN: INTRAVENOUS | Status: DC

## 2019-05-31 MED ORDER — MORPHINE 100MG IN NS 100ML (1MG/ML) PREMIX INFUSION
0.0000 mg/h | INTRAVENOUS | Status: DC
  Administered 2019-05-31: 5 mg/h via INTRAVENOUS
  Filled 2019-05-31: qty 100

## 2019-05-31 MED ORDER — ALUMINUM HYDROXIDE GEL 320 MG/5ML PO SUSP
30.0000 mL | Freq: Three times a day (TID) | ORAL | Status: DC
  Administered 2019-05-30 – 2019-05-31 (×2): 30 mL
  Filled 2019-05-31 (×3): qty 30

## 2019-05-31 MED ORDER — POLYVINYL ALCOHOL 1.4 % OP SOLN
1.0000 [drp] | Freq: Four times a day (QID) | OPHTHALMIC | Status: DC | PRN
  Filled 2019-05-31: qty 15

## 2019-05-31 MED ORDER — MORPHINE SULFATE (PF) 4 MG/ML IV SOLN: 4.0000 mg | INTRAVENOUS | Status: DC

## 2019-05-31 MED ORDER — ACETAMINOPHEN 325 MG PO TABS: 650.0000 mg | ORAL_TABLET | Freq: Four times a day (QID) | ORAL | Status: DC | PRN

## 2019-05-31 MED ORDER — GLYCOPYRROLATE 1 MG PO TABS: 1.0000 mg | ORAL_TABLET | ORAL | Status: DC | PRN

## 2019-05-31 MED ORDER — MORPHINE BOLUS VIA INFUSION
5.0000 mg | INTRAVENOUS | Status: DC | PRN
  Administered 2019-05-31: 5 mg via INTRAVENOUS
  Filled 2019-05-31: qty 5

## 2019-05-31 MED ORDER — ACETAMINOPHEN 650 MG RE SUPP: 650.0000 mg | Freq: Four times a day (QID) | RECTAL | Status: DC | PRN

## 2019-05-31 MED ORDER — HALOPERIDOL LACTATE 5 MG/ML IJ SOLN: 2.5000 mg | INTRAMUSCULAR | Status: DC | PRN

## 2019-05-31 MED ORDER — MORPHINE SULFATE (PF) 4 MG/ML IV SOLN
4.0000 mg | INTRAVENOUS | Status: DC | PRN
  Administered 2019-05-31: 4 mg via INTRAVENOUS

## 2019-05-31 MED ORDER — MORPHINE SULFATE (PF) 2 MG/ML IV SOLN: 2.0000 mg | INTRAVENOUS | Status: DC | PRN

## 2019-06-03 NOTE — Procedures (Signed)
Extubation Procedure Note  Patient Details:   Name: Lauren Warner DOB: 1966/05/06 MRN: [id number]   Airway Documentation:    Vent end date: 06-17-19 Vent end time: 1325   Evaluation  O2 sats: currently acceptable Complications: Complications of extubation to comfort care. Patient did tolerate procedure well. Bilateral Breath Sounds: Rhonchi   No   Patient extubated to comfort care per MD order and family wishes. Positive cuff leak was noted prior to extubation. RN is at bedside. RT will continue to monitor.   Latoi Giraldo Clyda Greener 2019-06-17, 1:40 PM

## 2019-06-03 NOTE — Progress Notes (Addendum)
PCCM Progress Note  S: Seen in f/u for severe encephalopathy.  Remains poorly responsive on vent, oral secretions a bit less today  O: Blood pressure (!) 150/70, pulse 84, temperature 100 F (37.8 C), temperature source Axillary, resp. rate 18, height 5\' 7"  (1.702 m), weight 89.1 kg, SpO2 100 %.  Scattered rhonci bilaterallly Flexion response to pain + corneals + cough/gag Does trigger vent Oral secretions noted  A:  # Severe encephalopathy with working diagnosis of hypoxic brain injury vs. Severe PRES # HTN # DKA # CAD # ESRD on HD # Dm1  Sugars good K up HgB down  P:  Spoke with daughter on phone.  She would like to proceed with allowing Nedda to pass away in peace.  Comfort care order set placed.  Anticipate death within 24 hours.  06/20/19 Erskine Emery MD

## 2019-06-03 NOTE — Progress Notes (Signed)
Patient ID: Lauren Warner, female   DOB: [birth year], 54 y.o.   MRN: [id number]  [facility] Progress Note   Assessment/ Plan:   1. Altered mental status with seizure and MRI findings of PRES versus hypoxic/ischemic encephalopathy: Ongoing close neurological surveillance to help determine overall outlook/prognosis.  Without clinical improvement, per CCM plan is now for transition to comfort care.  2. ESRD: cont HD TTS schedule. Had HD this am. As above, no further aggressive medical Rx.  Will sign off.   3. Anemia: Without overt loss, continue ESA and monitoring hemoglobin/hematocrit. 4. Hypertension: Blood pressures intermittently elevated, on NG hydralazine/ labetalol.  Kelly Splinter, MD Jun 19, 2019, 12:55 PM   Subjective:   Pt is not responsive   Objective:   BP (!) 94/49   Pulse 78   Temp 98.2 F (36.8 C) (Axillary)   Resp 18   Ht 5' 7"  (1.702 m)   Wt 89.9 kg   SpO2 100%   BMI 31.04 kg/m   Physical Exam: Gen: Intubated, unresponsive to voice/pain CVS: Pulse regular rhythm, S1 and S2 normal. Resp: Clear to auscultation, no rales/rhonchi Abd: Soft, obese, nontender Ext: Status post left BKA, no right lower extremity edema.  Pulsatile left upper arm AV fistula..  Outpatient HD: [facility]  4h 84kg 2/2 L AVF Heparin none L AVF - calc 0.25 tiw -mircera 100q2wks - last 12/12     Medications:    . aluminum hydroxide  30 mL Per Tube TID  . aspirin  81 mg Per Tube Daily  . B-complex with vitamin C  1 tablet Oral Daily  . chlorhexidine gluconate (MEDLINE KIT)  15 mL Mouth Rinse BID  . Chlorhexidine Gluconate Cloth  6 each Topical Q0600  . Chlorhexidine Gluconate Cloth  6 each Topical Q0600  . clopidogrel  75 mg Per Tube Daily  . feeding supplement (PRO-STAT SUGAR FREE 64)  30 mL Per Tube TID  . heparin  5,000 Units Subcutaneous Q8H  . hydrALAZINE  25 mg Per Tube Q8H  . insulin aspart  0-6 Units Subcutaneous Q4H  . insulin aspart  2 Units  Subcutaneous Q4H  . insulin detemir  10 Units Subcutaneous BID  . labetalol  200 mg Per Tube BID  . latanoprost  1 drop Both Eyes QHS  . levETIRAcetam  500 mg Per Tube BID  . mouth rinse  15 mL Mouth Rinse 10 times per day  .  morphine injection  4 mg Intravenous NOW  . pantoprazole sodium  40 mg Per Tube Daily  . sodium chloride flush  10-40 mL Intracatheter Q12H

## 2019-06-03 NOTE — Procedures (Signed)
   I was present at this dialysis session, have reviewed the session itself and made  appropriate changes Kelly Splinter MD [place] pager [phone]   06/13/2019, 10:34 AM

## 2019-06-03 NOTE — Discharge Summary (Signed)
Already done

## 2019-06-03 NOTE — Discharge Summary (Signed)
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  Workup reviewed evidence of profound hypoxemic injury and infarcts.  There may have been some element of PRES so patient was given over a week of supportive care and blood pressure control.  Despite this, she failed to regain consciousness and daughter allowed her to pass in peace on 06/08/19.  Erskine Emery MD PCCM

## 2019-06-03 NOTE — Care Management (Signed)
[facility] Case Manager and CSW called patient's daughter, Dariany Graunke, [phone]) to discuss concerns regarding financial and travel arrangements.  She lives in [place], and states she is financially unable to travel to [place] to be with her mother during her final moments.  Daughter requests any assistance we could provide with travel, but unfortunately there are no funds available to assist her.  She states that neither she nor patient have any friends or family that can assist her with travel expenses.   Daughter also expresses concern about getting her mom cremated when she passes.  Able to provide pt's daughter with list of [facility] funeral homes/crematories and information on assistance with end of life care through [employer]   Emotional support provided to daughter; she states she feels she is ready to withdraw care, and would like to speak with patient's nurse.  She feels that PMT involvement is unnecessary as it will not change anything.  Notified bedside nurse of our conversation.    Reinaldo Raddle, RN, BSN  Trauma/Neuro ICU Case Manager [phone]

## 2019-06-03 DEATH — deceased

## 2019-11-26 IMAGING — MR MR HEAD W/O CM
10 of 11 series · 42 of 48 positions shown · non-contrast
Comparison: Prior CT from 10/18/2017

CLINICAL DATA: Initial evaluation for acute syncope.

EXAM:
MRI HEAD WITHOUT CONTRAST
TECHNIQUE: Multiplanar, multiecho pulse sequences of the brain and surrounding
structures were obtained without intravenous contrast.

[Series 5001: ax dwi_tracew · axial · 3.0mm · 1.50mm/px · z∈[-92,+42]mm · 7 of 80 slices shown]
[im 1/80]
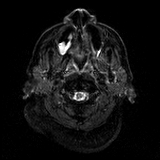
[im 14/80]
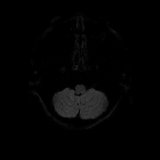
[im 27/80]
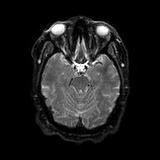
[im 40/80]
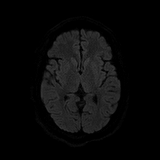
[im 53/80]
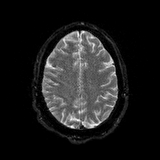
[im 66/80]
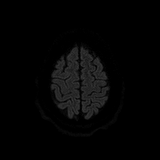
[im 80/80]
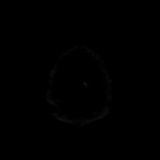

[Series 6001: ax dwi_adc · axial · 3.0mm · 1.50mm/px · z∈[-92,+42]mm · 4 of 40 slices shown]
[im 1/40]
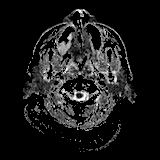
[im 14/40]
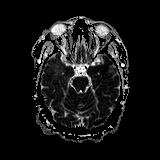
[im 27/40]
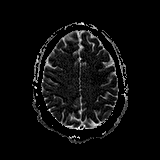
[im 40/40]
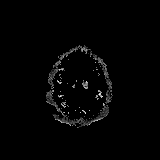

[Series 7001: cor dwi_tracew · coronal · 5.0mm · 1.44mm/px · 6 of 64 slices shown]
[im 1/64]
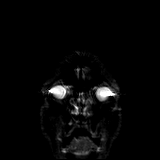
[im 13/64]
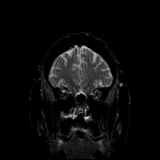
[im 26/64]
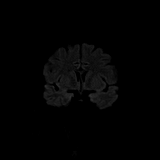
[im 38/64]
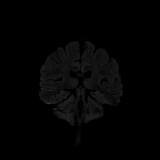
[im 51/64]
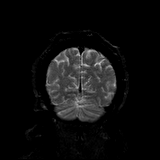
[im 64/64]
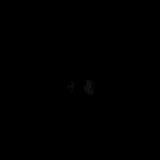

[Series 8001: cor dwi_adc · coronal · 5.0mm · 1.44mm/px · 3 of 32 slices shown]
[im 1/32]
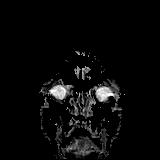
[im 16/32]
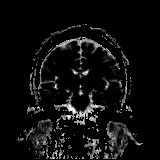
[im 32/32]
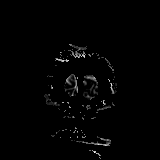

[Series 9001: T1 · sagittal · 5.0mm · 0.75mm/px · 2 of 23 slices shown]
[im 1/23]
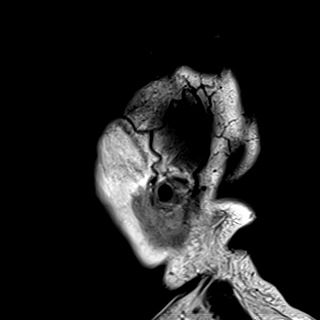
[im 23/23]
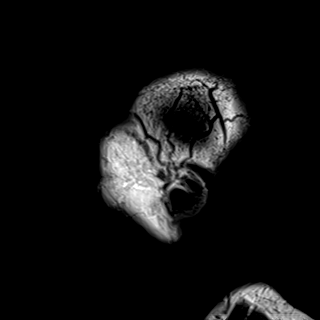

[T2 · axial · 5.0mm · 0.69mm/px · z∈[-98,+46]mm · 3 of 25 slices shown (1 of 2)]
[im 1/25]
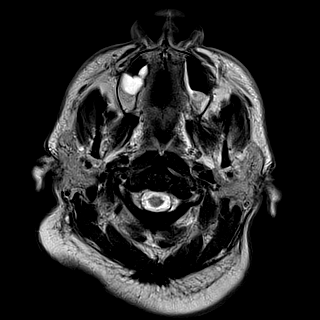
[im 13/25]
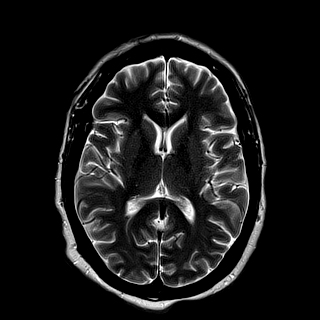
[im 25/25]
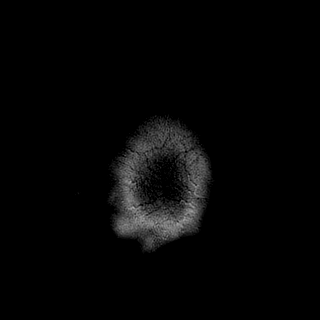

[FLAIR · axial · 5.0mm · 0.43mm/px · z∈[-97,+47]mm · 3 of 25 slices shown]
[im 1/25]
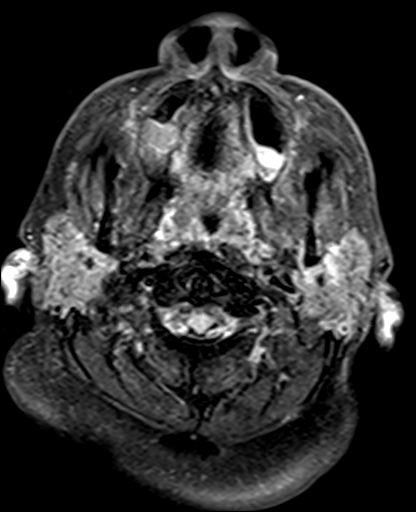
[im 13/25]
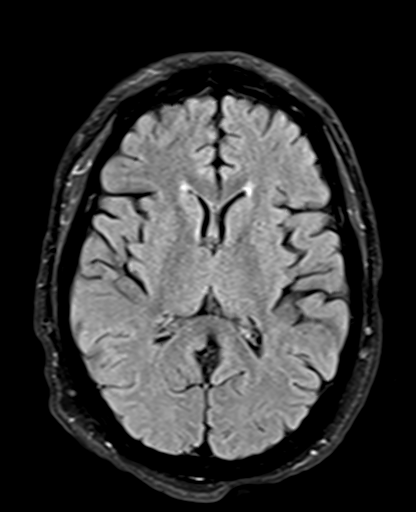
[im 25/25]
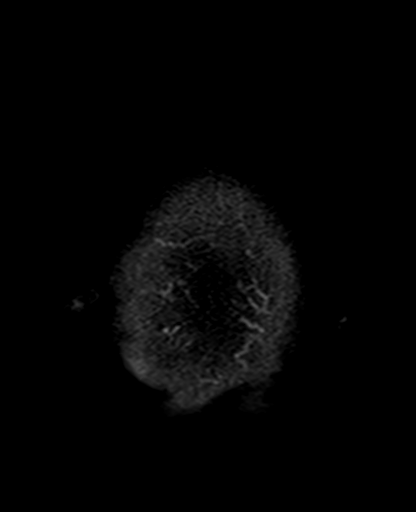

[swi_images · axial · 3.0mm · 0.86mm/px · z∈[-107,+57]mm · 6 of 56 slices shown]
[im 1/56]
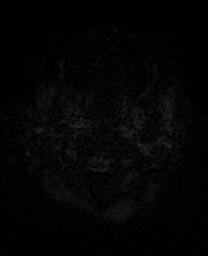
[im 12/56]
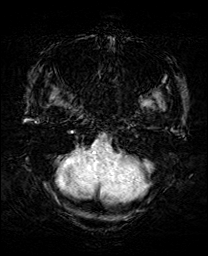
[im 23/56]
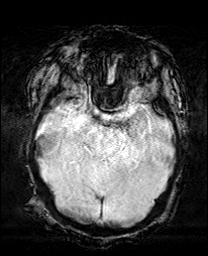
[im 34/56]
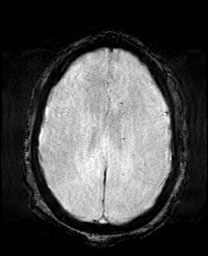
[im 45/56]
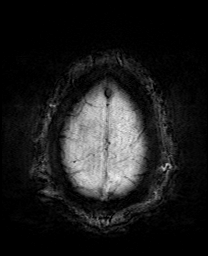
[im 56/56]
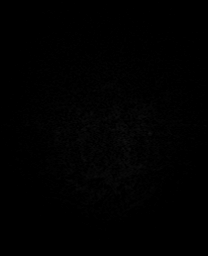

[mip_images(sw) · axial · 24.0mm · 0.86mm/px · z∈[-97,+47]mm · 5 of 49 slices shown]
[im 1/49]
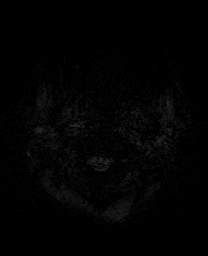
[im 13/49]
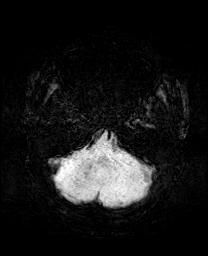
[im 25/49]
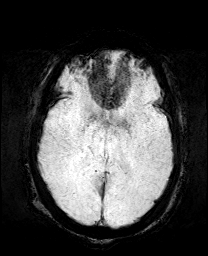
[im 37/49]
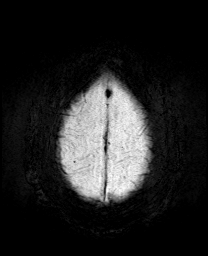
[im 49/49]
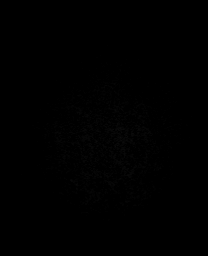

[T2 · coronal · 5.0mm · 0.34mm/px · 3 of 29 slices shown (2 of 2)]
[im 1/29]
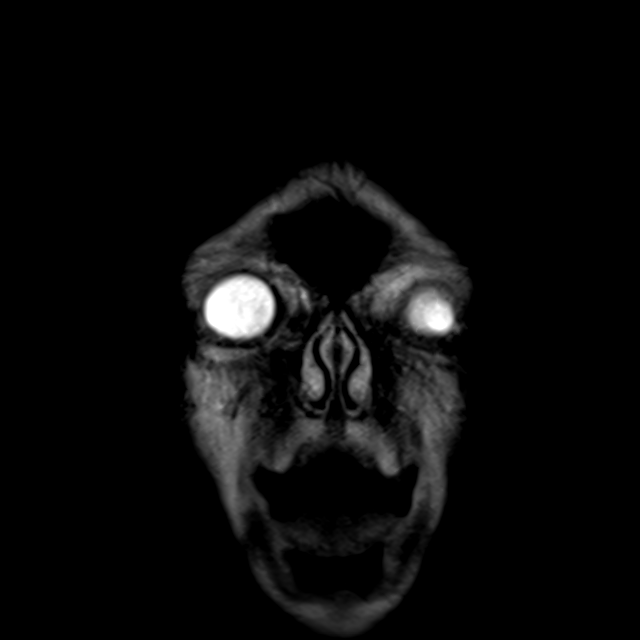
[im 15/29]
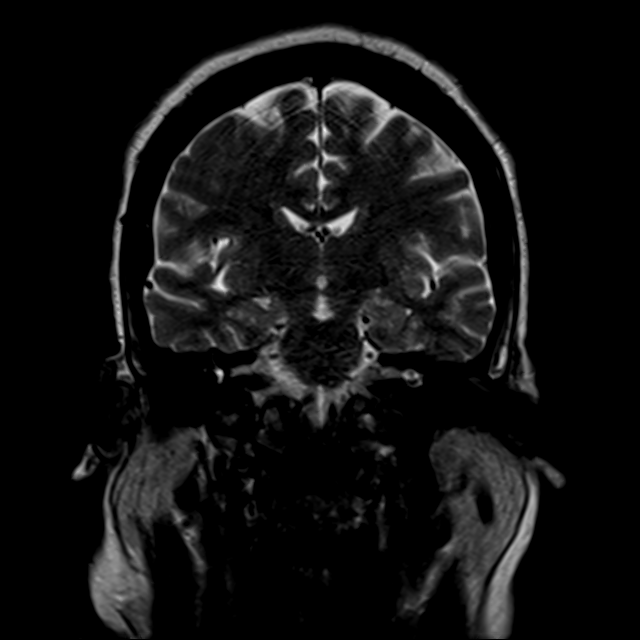
[im 29/29]
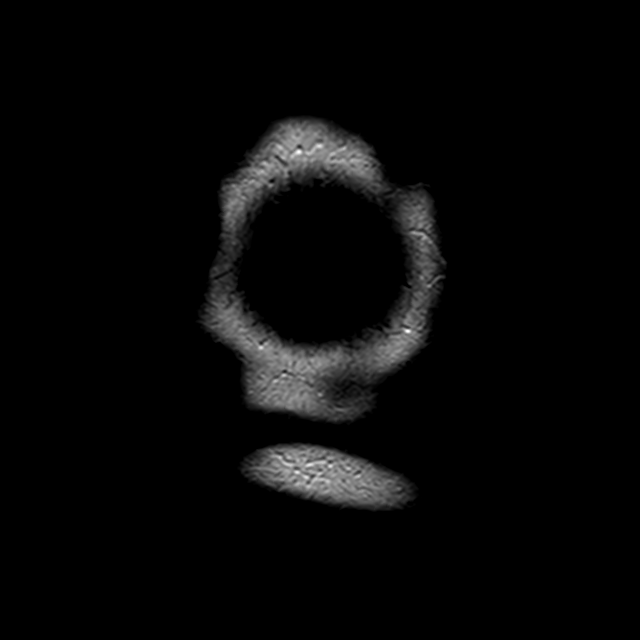

[42 of 48 positions shown; findings below may reference images not displayed]

FINDINGS: Brain: Cerebral volume within normal limits for age. No significant
cerebral white matter disease. No abnormal foci of restricted
diffusion to suggest acute or subacute ischemia. Gray-white matter
differentiation maintained. No encephalomalacia to suggest chronic
infarction. No evidence for acute intracranial hemorrhage. Few tiny
punctate chronic micro hemorrhages noted within the cerebral
hemispheres bilaterally, likely small vessel related.

No mass lesion, midline shift or mass effect. No hydrocephalus. No
extra-axial fluid collection. Major dural sinuses are grossly
patent.

Pituitary gland suprasellar region within normal limits. Midline
structures intact and normal.

Vascular: Major intracranial vascular flow voids are maintained.

Skull and upper cervical spine: Craniocervical junction within
normal limits. Upper cervical spine normal. Bone marrow signal
intensity normal. No scalp soft tissue abnormality.

Sinuses/Orbits: Globes and orbital soft tissues within normal
limits. Patient status post lens extraction bilaterally. Mild
scattered mucosal thickening within the ethmoidal air cells and
maxillary sinuses. Paranasal sinuses otherwise clear. Trace opacity
bilateral mastoid air cells, of doubtful significance. Inner ear
structures normal.

Other: None.
IMPRESSION: Negative brain MRI.  No acute intracranial abnormality identified.

## 2020-01-10 IMAGING — CR DG CHEST 2V
2 series · 2 of 2 positions shown · non-contrast
Comparison: 11/10/2017

CLINICAL DATA: Syncope.

EXAM:
CHEST - 2 VIEW

[chest lat]
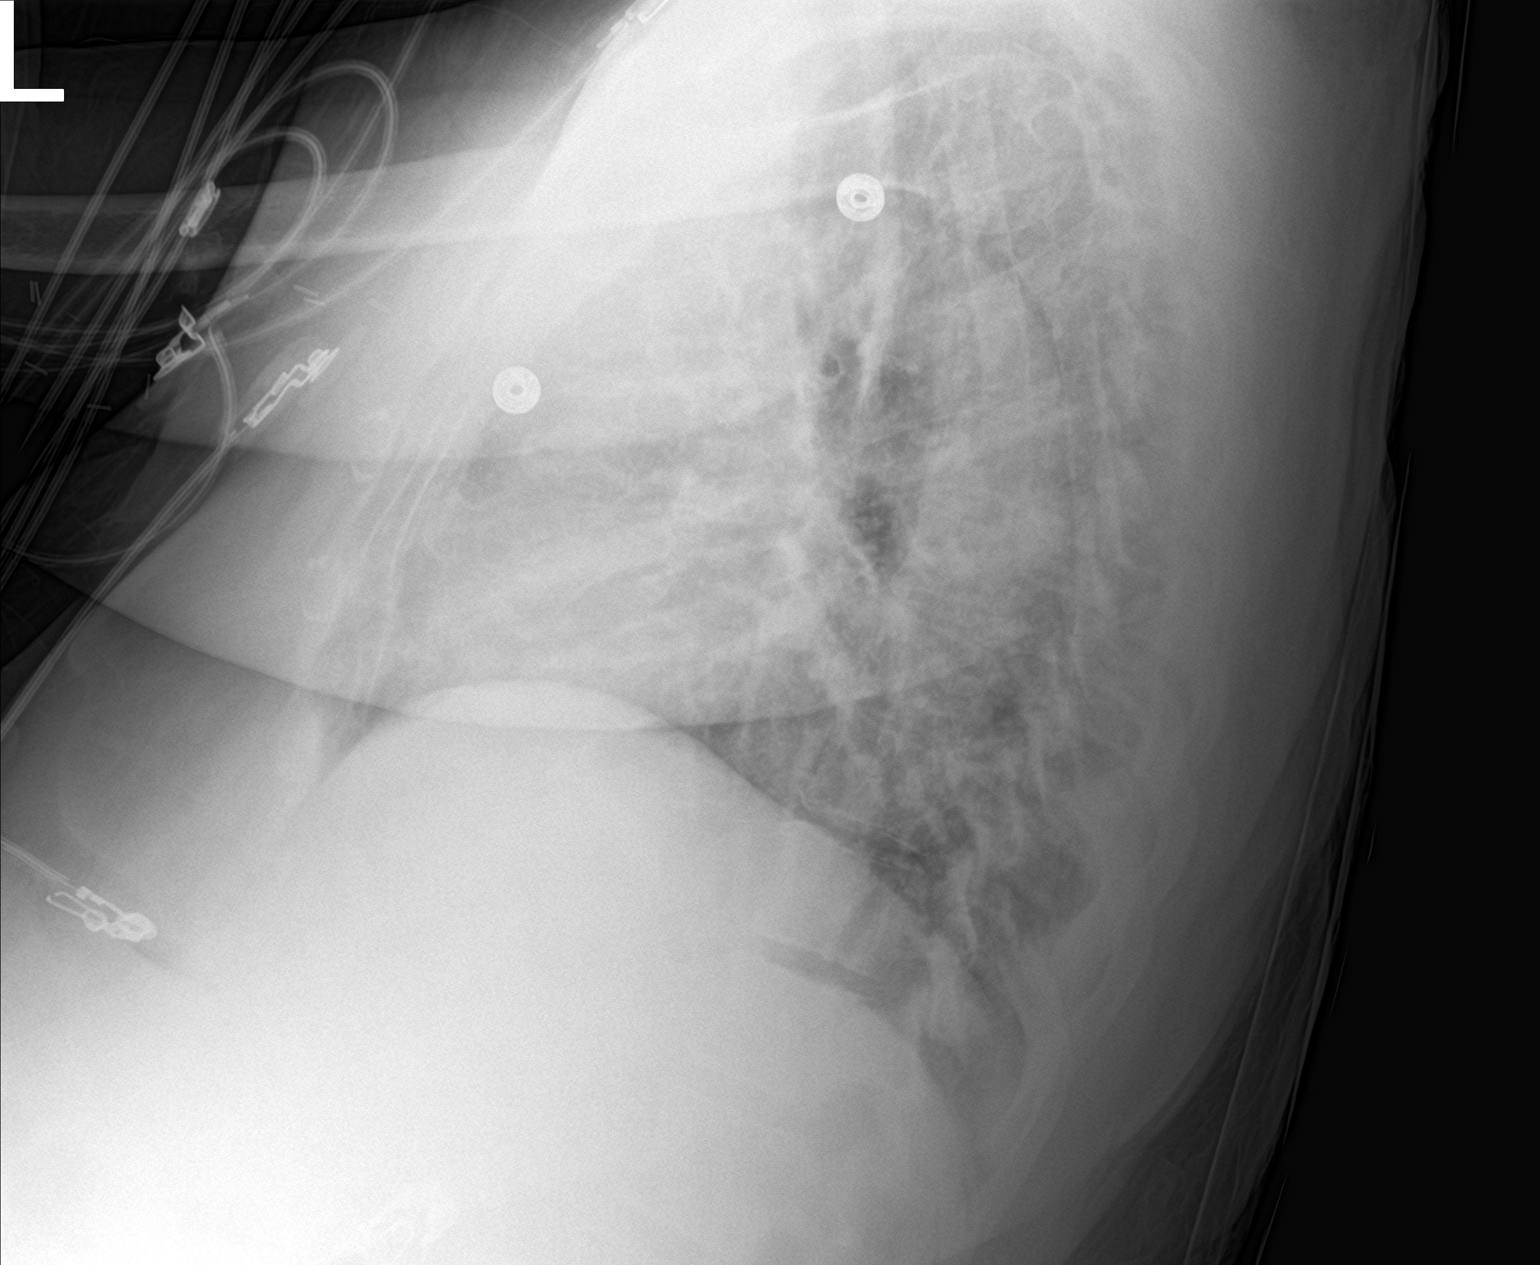

[chest ap]
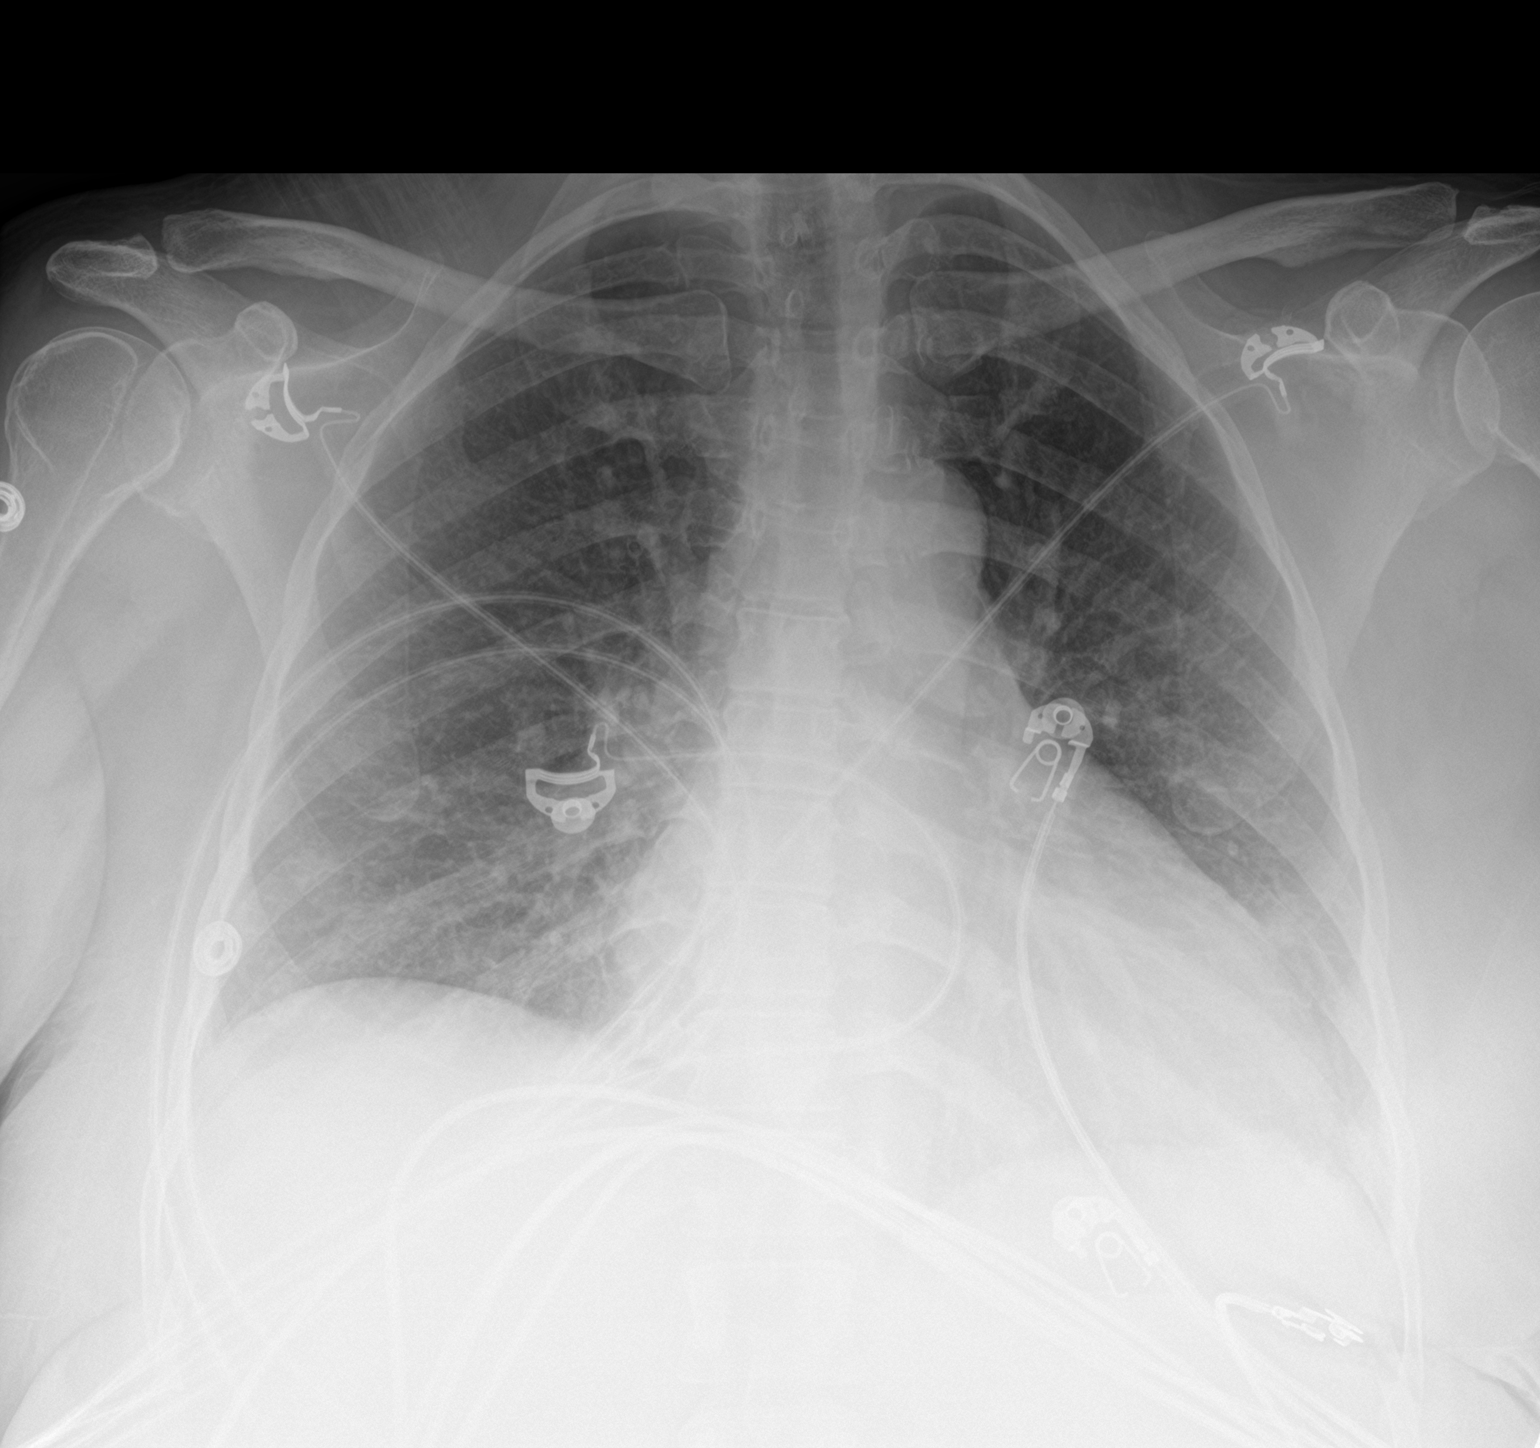

[2 of 2 positions shown; findings below may reference images not displayed]

FINDINGS: 9839 hours. Stable asymmetric elevation right hemidiaphragm.
Cardiopericardial silhouette is at upper limits of normal for size.
There is pulmonary vascular congestion without overt pulmonary
edema. The visualized bony structures of the thorax are intact.
Telemetry leads overlie the chest.
IMPRESSION: Borderline cardiomegaly with vascular congestion.

## 2020-02-28 IMAGING — DX DG CHEST 1V PORT
1 series · 1 of 1 positions shown · non-contrast
Comparison: 01/07/2018

CLINICAL DATA: Shortness of breath.

EXAM:
PORTABLE CHEST 1 VIEW

[chest]
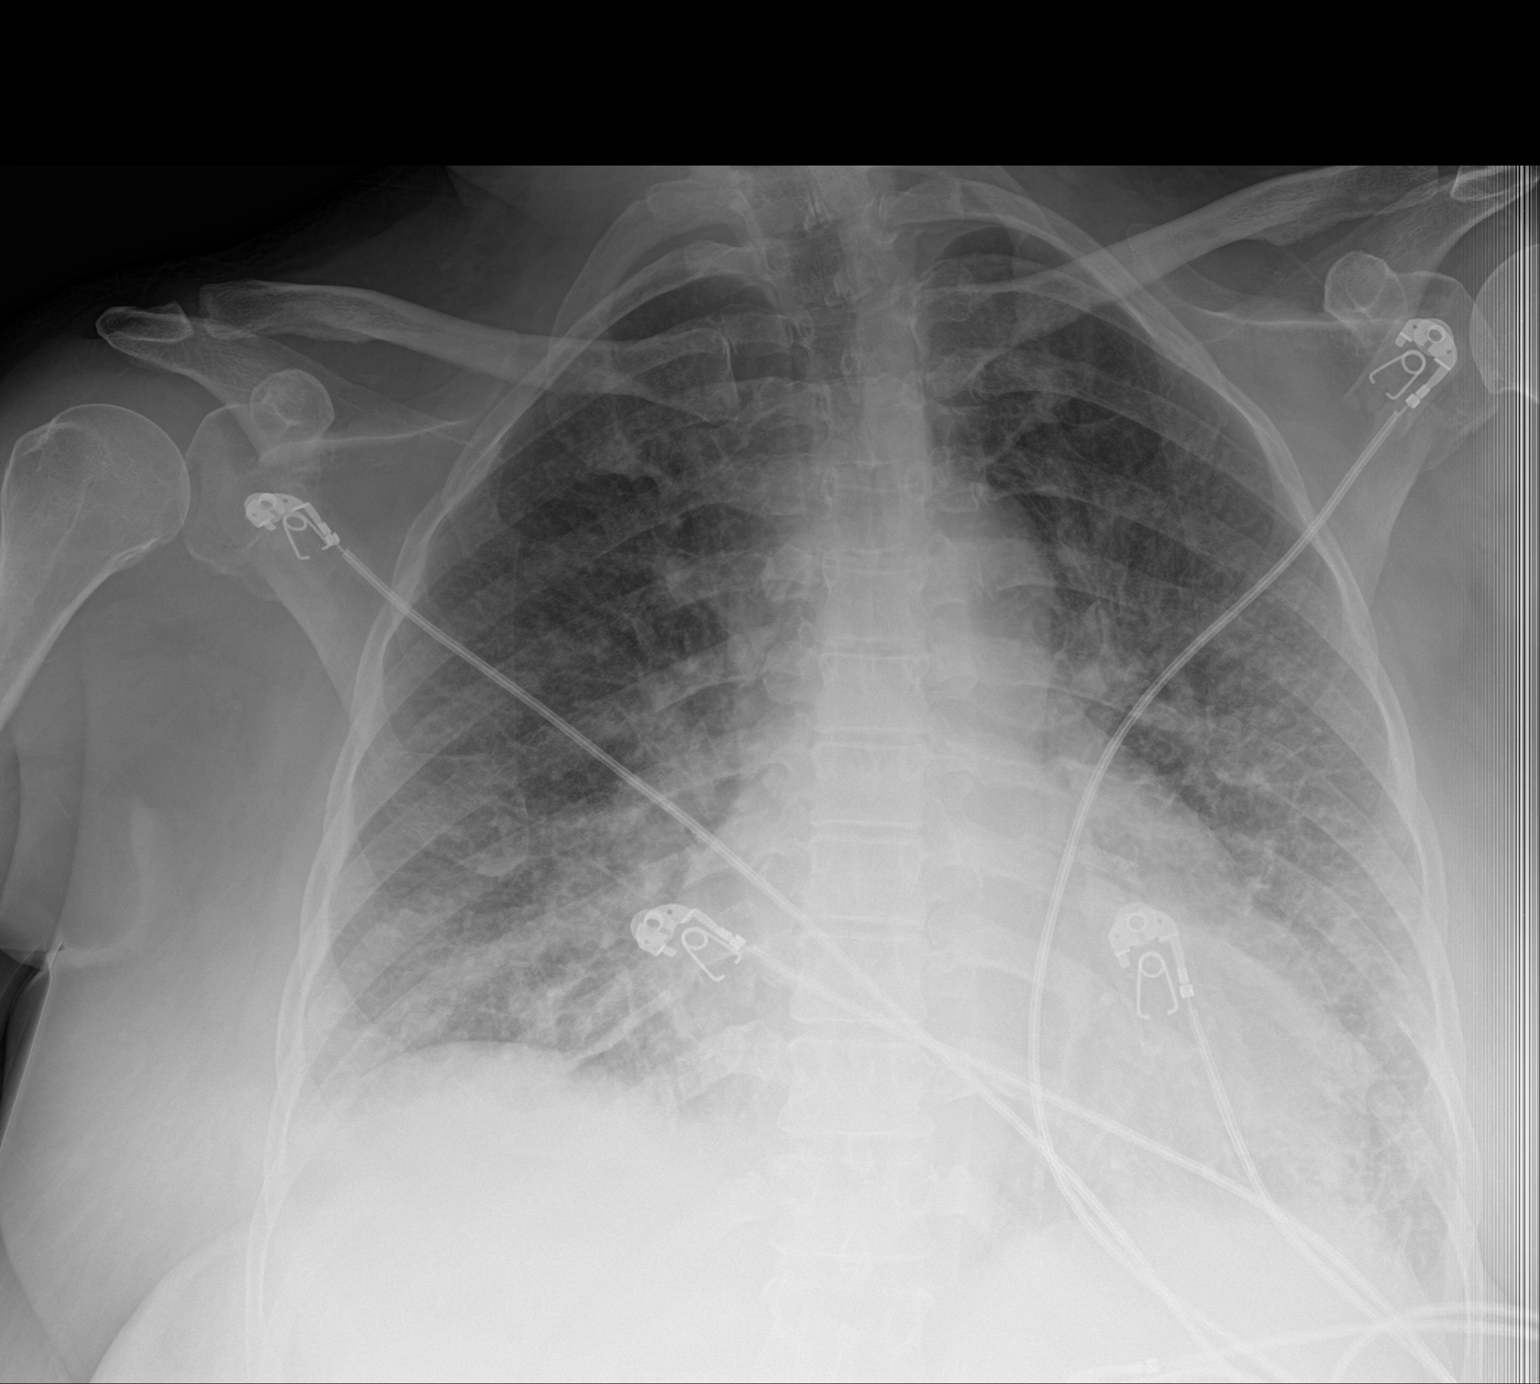

[1 of 1 positions shown; findings below may reference images not displayed]

FINDINGS: Unchanged cardiomegaly. Slight worsening of pulmonary edema since
prior exam. Patchy bibasilar opacities may be vascular or
atelectasis. Suspect small right pleural effusion. No pneumothorax.
IMPRESSION: Cardiomegaly with increased pulmonary edema since prior exam and
possible right pleural effusion.

## 2020-02-29 IMAGING — DX DG ANKLE 2V *L*
1 series · 2 of 2 positions shown · non-contrast
Comparison: 10/18/2017

CLINICAL DATA: Atraumatic left ankle pain and swelling

EXAM:
LEFT ANKLE - 2 VIEW

[Series 1: ankle · 0.14mm/px · 2 of 2 slices shown]
[im 1/2]
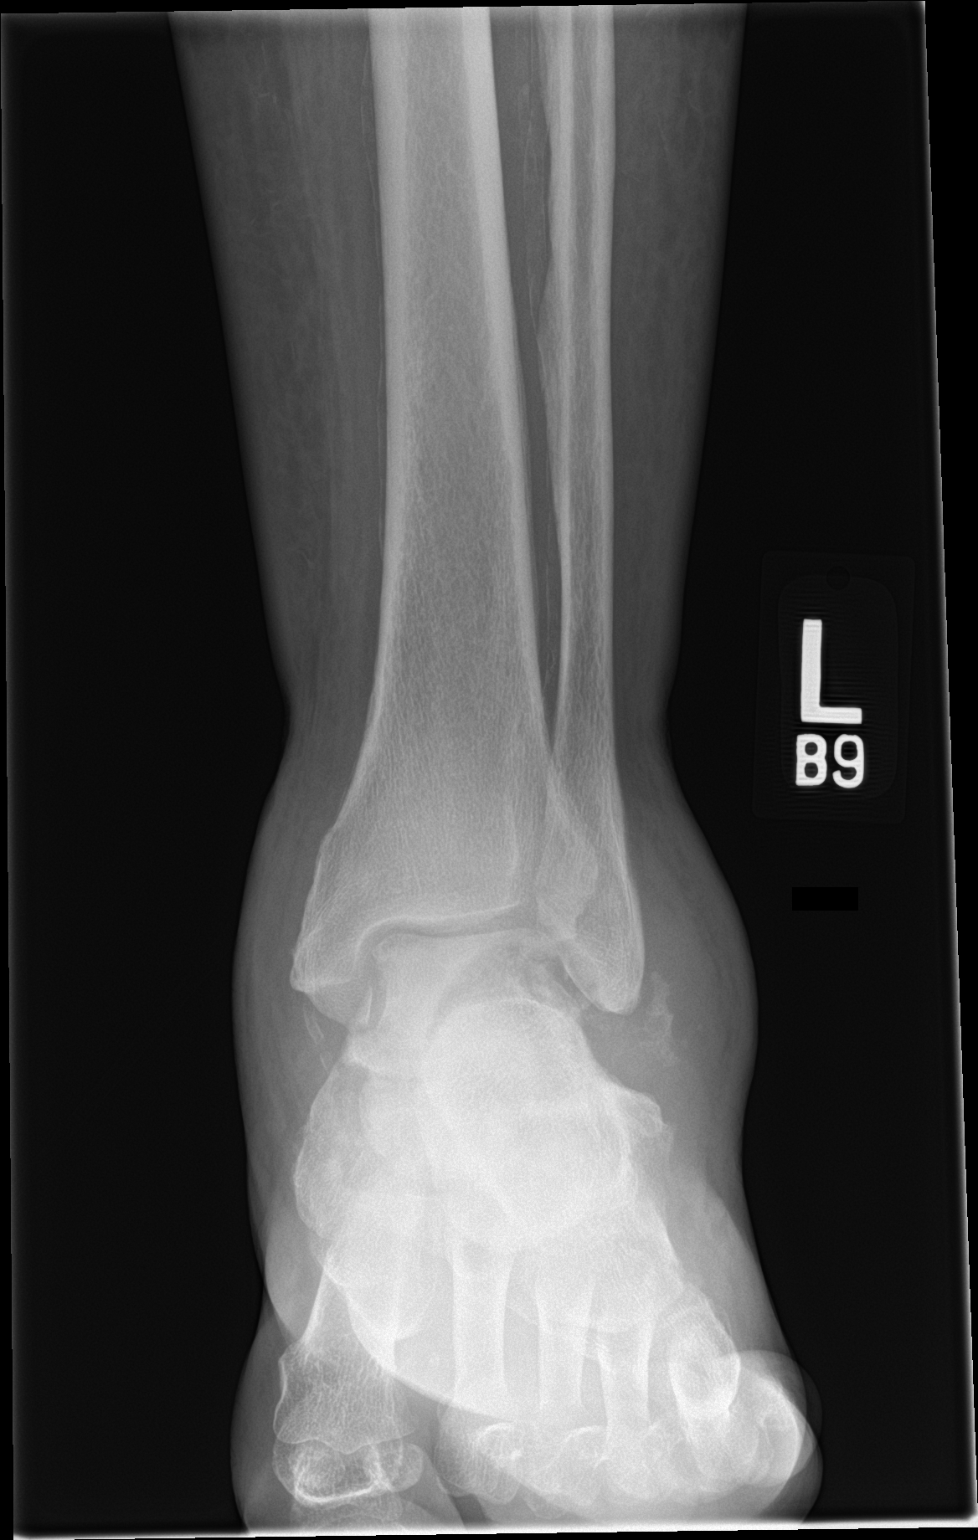
[im 2/2]
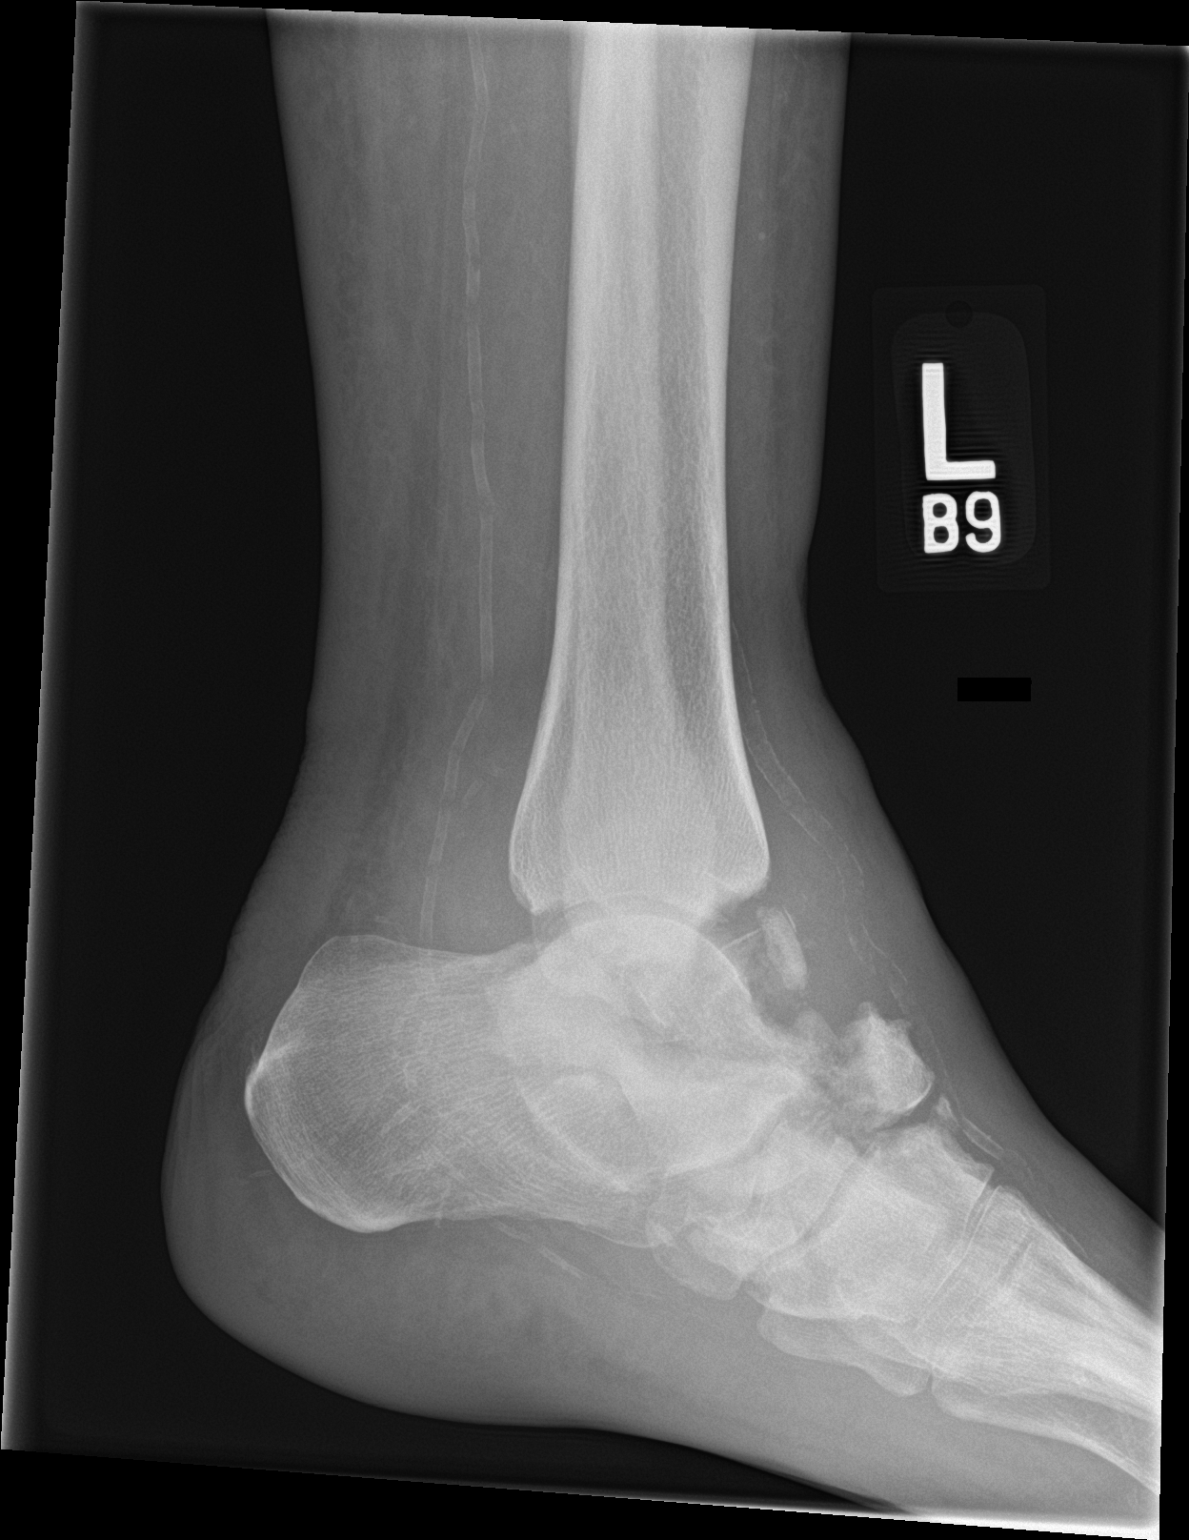

[2 of 2 positions shown; findings below may reference images not displayed]

FINDINGS: Chronic fragmentation of the dorsal navicular and anterior talus
with regional soft tissue swelling. No progressive progression as
expected for osteomyelitis. Charcot joint is not excluded in this
patient with diabetes. There is osteopenia and atherosclerosis.
IMPRESSION: 1. Similar-appearing dorsal navicular and anterior talus
fragmentation when compared to September 2017. Lack of progression argues
against infection, but Charcot joint is still considered.
2. Extensive soft tissue swelling.
